# Patient Record
Sex: Female | Born: 1940
Health system: Southern US, Community
[De-identification: ages and names within clinical notes are randomized; demographics above are authoritative.]

## PROBLEM LIST (undated history)

## (undated) ENCOUNTER — Emergency Department (HOSPITAL_BASED_OUTPATIENT_CLINIC_OR_DEPARTMENT_OTHER): Discharge status: Absent without leave | Payer: Medicare Other

## (undated) DIAGNOSIS — K219 Gastro-esophageal reflux disease without esophagitis: Secondary | ICD-10-CM

## (undated) DIAGNOSIS — N809 Endometriosis, unspecified: Secondary | ICD-10-CM

## (undated) DIAGNOSIS — F32A Depression, unspecified: Secondary | ICD-10-CM

## (undated) DIAGNOSIS — G43909 Migraine, unspecified, not intractable, without status migrainosus: Secondary | ICD-10-CM

## (undated) DIAGNOSIS — I10 Essential (primary) hypertension: Secondary | ICD-10-CM

## (undated) DIAGNOSIS — F329 Major depressive disorder, single episode, unspecified: Secondary | ICD-10-CM

## (undated) DIAGNOSIS — F419 Anxiety disorder, unspecified: Secondary | ICD-10-CM

## (undated) DIAGNOSIS — C50919 Malignant neoplasm of unspecified site of unspecified female breast: Secondary | ICD-10-CM

## (undated) HISTORY — PX: PELVIC FRACTURE SURGERY: SHX119

## (undated) HISTORY — DX: Gastro-esophageal reflux disease without esophagitis: K21.9

## (undated) HISTORY — PX: OTHER SURGICAL HISTORY: SHX169

## (undated) HISTORY — DX: Migraine, unspecified, not intractable, without status migrainosus: G43.909

## (undated) HISTORY — DX: Endometriosis, unspecified: N80.9

## (undated) HISTORY — DX: Essential (primary) hypertension: I10

## (undated) HISTORY — DX: Malignant neoplasm of unspecified site of unspecified female breast: C50.919

## (undated) HISTORY — PX: BREAST LUMPECTOMY: SHX2

## (undated) HISTORY — DX: Major depressive disorder, single episode, unspecified: F32.9

## (undated) HISTORY — DX: Depression, unspecified: F32.A

## (undated) HISTORY — DX: Anxiety disorder, unspecified: F41.9

---

## 1995-01-22 DIAGNOSIS — C50919 Malignant neoplasm of unspecified site of unspecified female breast: Secondary | ICD-10-CM

## 1995-01-22 HISTORY — DX: Malignant neoplasm of unspecified site of unspecified female breast: C50.919

## 1997-03-24 ENCOUNTER — Ambulatory Visit (HOSPITAL_COMMUNITY): Admission: RE | Admit: 1997-03-24 | Discharge: 1997-03-24 | Payer: Self-pay | Admitting: General Surgery

## 1997-11-13 ENCOUNTER — Emergency Department (HOSPITAL_COMMUNITY): Admission: EM | Admit: 1997-11-13 | Discharge: 1997-11-13 | Payer: Self-pay

## 1997-12-01 ENCOUNTER — Other Ambulatory Visit: Admission: RE | Admit: 1997-12-01 | Discharge: 1997-12-01 | Payer: Self-pay | Admitting: Obstetrics and Gynecology

## 1998-05-02 ENCOUNTER — Ambulatory Visit (HOSPITAL_COMMUNITY): Admission: RE | Admit: 1998-05-02 | Discharge: 1998-05-02 | Payer: Self-pay | Admitting: Obstetrics and Gynecology

## 1998-05-02 ENCOUNTER — Encounter: Payer: Self-pay | Admitting: General Surgery

## 1999-02-12 ENCOUNTER — Other Ambulatory Visit: Admission: RE | Admit: 1999-02-12 | Discharge: 1999-02-12 | Payer: Self-pay | Admitting: Obstetrics and Gynecology

## 1999-05-07 ENCOUNTER — Ambulatory Visit (HOSPITAL_COMMUNITY): Admission: RE | Admit: 1999-05-07 | Discharge: 1999-05-07 | Payer: Self-pay | Admitting: General Surgery

## 1999-05-07 ENCOUNTER — Encounter: Payer: Self-pay | Admitting: General Surgery

## 2000-03-05 ENCOUNTER — Other Ambulatory Visit: Admission: RE | Admit: 2000-03-05 | Discharge: 2000-03-05 | Payer: Self-pay | Admitting: Obstetrics and Gynecology

## 2000-05-08 ENCOUNTER — Encounter: Payer: Self-pay | Admitting: General Surgery

## 2000-05-08 ENCOUNTER — Ambulatory Visit (HOSPITAL_COMMUNITY): Admission: RE | Admit: 2000-05-08 | Discharge: 2000-05-08 | Payer: Self-pay | Admitting: General Surgery

## 2001-05-12 ENCOUNTER — Encounter: Admission: RE | Admit: 2001-05-12 | Discharge: 2001-05-12 | Payer: Self-pay | Admitting: Obstetrics and Gynecology

## 2001-05-12 ENCOUNTER — Encounter: Payer: Self-pay | Admitting: Obstetrics and Gynecology

## 2002-05-14 ENCOUNTER — Encounter: Admission: RE | Admit: 2002-05-14 | Discharge: 2002-05-14 | Payer: Self-pay | Admitting: Obstetrics and Gynecology

## 2002-05-14 ENCOUNTER — Encounter: Payer: Self-pay | Admitting: Obstetrics and Gynecology

## 2002-06-03 ENCOUNTER — Other Ambulatory Visit: Admission: RE | Admit: 2002-06-03 | Discharge: 2002-06-03 | Payer: Self-pay | Admitting: Obstetrics and Gynecology

## 2002-11-22 ENCOUNTER — Ambulatory Visit (HOSPITAL_COMMUNITY): Admission: RE | Admit: 2002-11-22 | Discharge: 2002-11-22 | Payer: Self-pay | Admitting: Gastroenterology

## 2002-11-22 ENCOUNTER — Encounter: Payer: Self-pay | Admitting: Internal Medicine

## 2002-11-22 LAB — HM COLONOSCOPY

## 2003-05-16 ENCOUNTER — Encounter: Admission: RE | Admit: 2003-05-16 | Discharge: 2003-05-16 | Payer: Self-pay | Admitting: Obstetrics and Gynecology

## 2003-07-26 ENCOUNTER — Other Ambulatory Visit: Admission: RE | Admit: 2003-07-26 | Discharge: 2003-07-26 | Payer: Self-pay | Admitting: Obstetrics and Gynecology

## 2004-05-29 ENCOUNTER — Encounter: Admission: RE | Admit: 2004-05-29 | Discharge: 2004-05-29 | Payer: Self-pay | Admitting: Obstetrics and Gynecology

## 2004-09-18 ENCOUNTER — Other Ambulatory Visit: Admission: RE | Admit: 2004-09-18 | Discharge: 2004-09-18 | Payer: Self-pay | Admitting: Obstetrics and Gynecology

## 2005-07-12 ENCOUNTER — Encounter: Admission: RE | Admit: 2005-07-12 | Discharge: 2005-07-12 | Payer: Self-pay | Admitting: Obstetrics and Gynecology

## 2005-10-15 ENCOUNTER — Other Ambulatory Visit: Admission: RE | Admit: 2005-10-15 | Discharge: 2005-10-15 | Payer: Self-pay | Admitting: Obstetrics & Gynecology

## 2006-02-17 ENCOUNTER — Ambulatory Visit: Payer: Self-pay | Admitting: Family Medicine

## 2006-02-17 LAB — CONVERTED CEMR LAB
CO2: 30 meq/L (ref 19–32)
Calcium: 9.9 mg/dL (ref 8.4–10.5)
Chloride: 102 meq/L (ref 96–112)
Creatinine, Ser: 0.9 mg/dL (ref 0.4–1.2)
GFR calc Af Amer: 81 mL/min
GFR calc non Af Amer: 67 mL/min
Glucose, Bld: 93 mg/dL (ref 70–99)

## 2006-05-20 ENCOUNTER — Ambulatory Visit: Payer: Self-pay | Admitting: Family Medicine

## 2006-07-15 ENCOUNTER — Encounter: Admission: RE | Admit: 2006-07-15 | Discharge: 2006-07-15 | Payer: Self-pay | Admitting: Obstetrics & Gynecology

## 2006-08-28 ENCOUNTER — Encounter (INDEPENDENT_AMBULATORY_CARE_PROVIDER_SITE_OTHER): Payer: Self-pay | Admitting: Family Medicine

## 2006-10-06 ENCOUNTER — Ambulatory Visit: Payer: Self-pay | Admitting: Family Medicine

## 2006-10-06 DIAGNOSIS — Z853 Personal history of malignant neoplasm of breast: Secondary | ICD-10-CM | POA: Insufficient documentation

## 2006-10-06 DIAGNOSIS — F419 Anxiety disorder, unspecified: Secondary | ICD-10-CM

## 2006-10-06 DIAGNOSIS — I10 Essential (primary) hypertension: Secondary | ICD-10-CM | POA: Insufficient documentation

## 2006-10-06 DIAGNOSIS — F329 Major depressive disorder, single episode, unspecified: Secondary | ICD-10-CM

## 2006-10-07 ENCOUNTER — Telehealth (INDEPENDENT_AMBULATORY_CARE_PROVIDER_SITE_OTHER): Payer: Self-pay | Admitting: *Deleted

## 2006-10-07 LAB — CONVERTED CEMR LAB
GFR calc Af Amer: 93 mL/min
Potassium: 3.2 meq/L — ABNORMAL LOW (ref 3.5–5.1)
Sodium: 142 meq/L (ref 135–145)

## 2006-10-16 ENCOUNTER — Ambulatory Visit: Payer: Self-pay | Admitting: Family Medicine

## 2006-10-16 ENCOUNTER — Telehealth (INDEPENDENT_AMBULATORY_CARE_PROVIDER_SITE_OTHER): Payer: Self-pay | Admitting: *Deleted

## 2006-10-27 ENCOUNTER — Ambulatory Visit: Payer: Self-pay | Admitting: Family Medicine

## 2006-10-30 ENCOUNTER — Telehealth (INDEPENDENT_AMBULATORY_CARE_PROVIDER_SITE_OTHER): Payer: Self-pay | Admitting: *Deleted

## 2006-10-30 ENCOUNTER — Encounter (INDEPENDENT_AMBULATORY_CARE_PROVIDER_SITE_OTHER): Payer: Self-pay | Admitting: *Deleted

## 2006-10-30 LAB — CONVERTED CEMR LAB: Potassium: 3.5 meq/L (ref 3.5–5.1)

## 2006-11-07 ENCOUNTER — Ambulatory Visit: Payer: Self-pay | Admitting: Family Medicine

## 2006-11-25 ENCOUNTER — Other Ambulatory Visit: Admission: RE | Admit: 2006-11-25 | Discharge: 2006-11-25 | Payer: Self-pay | Admitting: Obstetrics and Gynecology

## 2006-12-17 ENCOUNTER — Ambulatory Visit: Payer: Self-pay | Admitting: Family Medicine

## 2007-01-29 ENCOUNTER — Encounter: Admission: RE | Admit: 2007-01-29 | Discharge: 2007-01-29 | Payer: Self-pay | Admitting: Obstetrics & Gynecology

## 2007-02-13 ENCOUNTER — Ambulatory Visit: Payer: Self-pay | Admitting: Family Medicine

## 2007-02-13 LAB — CONVERTED CEMR LAB
BUN: 9 mg/dL (ref 6–23)
CO2: 29 meq/L (ref 19–32)
Calcium: 9.4 mg/dL (ref 8.4–10.5)
Chloride: 102 meq/L (ref 96–112)
Creatinine, Ser: 0.8 mg/dL (ref 0.4–1.2)
GFR calc non Af Amer: 77 mL/min
Potassium: 3.5 meq/L (ref 3.5–5.1)
Sodium: 139 meq/L (ref 135–145)

## 2007-02-16 ENCOUNTER — Encounter (INDEPENDENT_AMBULATORY_CARE_PROVIDER_SITE_OTHER): Payer: Self-pay | Admitting: *Deleted

## 2007-04-07 ENCOUNTER — Ambulatory Visit: Payer: Self-pay | Admitting: Family Medicine

## 2007-04-08 ENCOUNTER — Encounter (INDEPENDENT_AMBULATORY_CARE_PROVIDER_SITE_OTHER): Payer: Self-pay | Admitting: *Deleted

## 2007-04-08 LAB — CONVERTED CEMR LAB
CO2: 32 meq/L (ref 19–32)
Creatinine, Ser: 0.8 mg/dL (ref 0.4–1.2)
GFR calc Af Amer: 93 mL/min
Glucose, Bld: 103 mg/dL — ABNORMAL HIGH (ref 70–99)
Sodium: 139 meq/L (ref 135–145)

## 2007-07-27 ENCOUNTER — Encounter: Admission: RE | Admit: 2007-07-27 | Discharge: 2007-07-27 | Payer: Self-pay | Admitting: Obstetrics and Gynecology

## 2007-08-18 ENCOUNTER — Ambulatory Visit: Payer: Self-pay | Admitting: Family Medicine

## 2007-08-26 ENCOUNTER — Encounter (INDEPENDENT_AMBULATORY_CARE_PROVIDER_SITE_OTHER): Payer: Self-pay | Admitting: *Deleted

## 2007-11-09 ENCOUNTER — Telehealth (INDEPENDENT_AMBULATORY_CARE_PROVIDER_SITE_OTHER): Payer: Self-pay | Admitting: *Deleted

## 2007-12-07 ENCOUNTER — Other Ambulatory Visit: Admission: RE | Admit: 2007-12-07 | Discharge: 2007-12-07 | Payer: Self-pay | Admitting: Obstetrics & Gynecology

## 2007-12-22 ENCOUNTER — Telehealth (INDEPENDENT_AMBULATORY_CARE_PROVIDER_SITE_OTHER): Payer: Self-pay | Admitting: *Deleted

## 2007-12-23 ENCOUNTER — Telehealth (INDEPENDENT_AMBULATORY_CARE_PROVIDER_SITE_OTHER): Payer: Self-pay | Admitting: *Deleted

## 2007-12-31 ENCOUNTER — Telehealth (INDEPENDENT_AMBULATORY_CARE_PROVIDER_SITE_OTHER): Payer: Self-pay | Admitting: *Deleted

## 2008-01-07 ENCOUNTER — Telehealth (INDEPENDENT_AMBULATORY_CARE_PROVIDER_SITE_OTHER): Payer: Self-pay | Admitting: *Deleted

## 2008-01-22 LAB — CONVERTED CEMR LAB: Pap Smear: NORMAL

## 2008-01-26 ENCOUNTER — Other Ambulatory Visit: Admission: RE | Admit: 2008-01-26 | Discharge: 2008-01-26 | Payer: Self-pay | Admitting: Obstetrics and Gynecology

## 2008-02-04 ENCOUNTER — Telehealth (INDEPENDENT_AMBULATORY_CARE_PROVIDER_SITE_OTHER): Payer: Self-pay | Admitting: *Deleted

## 2008-07-28 ENCOUNTER — Encounter: Admission: RE | Admit: 2008-07-28 | Discharge: 2008-07-28 | Payer: Self-pay | Admitting: Obstetrics and Gynecology

## 2008-08-24 ENCOUNTER — Ambulatory Visit: Payer: Self-pay | Admitting: Internal Medicine

## 2008-08-29 ENCOUNTER — Ambulatory Visit: Payer: Self-pay | Admitting: Internal Medicine

## 2008-08-30 ENCOUNTER — Encounter: Payer: Self-pay | Admitting: Internal Medicine

## 2008-09-02 LAB — CONVERTED CEMR LAB
CO2: 26 meq/L (ref 19–32)
Calcium: 9.4 mg/dL (ref 8.4–10.5)
Chloride: 109 meq/L (ref 96–112)
Cholesterol: 198 mg/dL (ref 0–200)
Eosinophils Absolute: 0.1 10*3/uL (ref 0.0–0.7)
Glucose, Bld: 93 mg/dL (ref 70–99)
Hemoglobin: 14.9 g/dL (ref 12.0–15.0)
LDL Cholesterol: 124 mg/dL — ABNORMAL HIGH (ref 0–99)
Lymphs Abs: 2 10*3/uL (ref 0.7–4.0)
MCV: 92.7 fL (ref 78.0–100.0)
Monocytes Absolute: 0.8 10*3/uL (ref 0.1–1.0)
Monocytes Relative: 12.6 % — ABNORMAL HIGH (ref 3.0–12.0)
Neutro Abs: 3.2 10*3/uL (ref 1.4–7.7)
Neutrophils Relative %: 52.9 % (ref 43.0–77.0)
RBC: 4.7 M/uL (ref 3.87–5.11)
RDW: 12.5 % (ref 11.5–14.6)
Sodium: 143 meq/L (ref 135–145)
Total CHOL/HDL Ratio: 4
VLDL: 20.2 mg/dL (ref 0.0–40.0)

## 2008-11-11 ENCOUNTER — Encounter: Payer: Self-pay | Admitting: Internal Medicine

## 2009-02-28 ENCOUNTER — Ambulatory Visit: Payer: Self-pay | Admitting: Internal Medicine

## 2009-08-22 ENCOUNTER — Encounter: Admission: RE | Admit: 2009-08-22 | Discharge: 2009-08-22 | Payer: Self-pay | Admitting: Obstetrics and Gynecology

## 2009-08-28 ENCOUNTER — Ambulatory Visit: Payer: Self-pay | Admitting: Internal Medicine

## 2009-08-28 DIAGNOSIS — K219 Gastro-esophageal reflux disease without esophagitis: Secondary | ICD-10-CM | POA: Insufficient documentation

## 2009-08-31 LAB — CONVERTED CEMR LAB
BUN: 16 mg/dL (ref 6–23)
Basophils Absolute: 0.1 10*3/uL (ref 0.0–0.1)
Basophils Relative: 1.3 % (ref 0.0–3.0)
Calcium: 9.4 mg/dL (ref 8.4–10.5)
Chloride: 103 meq/L (ref 96–112)
Cholesterol: 190 mg/dL (ref 0–200)
Eosinophils Absolute: 0.1 10*3/uL (ref 0.0–0.7)
GFR calc non Af Amer: 68.2 mL/min (ref >60)
Hemoglobin: 15 g/dL (ref 12.0–15.0)
Lymphocytes Relative: 38.7 % (ref 12.0–46.0)
Lymphs Abs: 2.4 10*3/uL (ref 0.7–4.0)
MCHC: 34.6 g/dL (ref 30.0–36.0)
Neutro Abs: 2.8 10*3/uL (ref 1.4–7.7)
Neutrophils Relative %: 45.9 % (ref 43.0–77.0)
Platelets: 316 10*3/uL (ref 150.0–400.0)
Potassium: 4 meq/L (ref 3.5–5.1)
RDW: 13.1 % (ref 11.5–14.6)
VLDL: 21 mg/dL (ref 0.0–40.0)
WBC: 6.2 10*3/uL (ref 4.5–10.5)

## 2009-09-11 ENCOUNTER — Telehealth (INDEPENDENT_AMBULATORY_CARE_PROVIDER_SITE_OTHER): Payer: Self-pay | Admitting: *Deleted

## 2009-09-19 ENCOUNTER — Telehealth (INDEPENDENT_AMBULATORY_CARE_PROVIDER_SITE_OTHER): Payer: Self-pay | Admitting: *Deleted

## 2009-11-23 ENCOUNTER — Encounter (INDEPENDENT_AMBULATORY_CARE_PROVIDER_SITE_OTHER): Payer: Self-pay | Admitting: *Deleted

## 2010-02-18 LAB — CONVERTED CEMR LAB
ALT: 18 U/L
AST: 21 U/L
Albumin: 4 g/dL
Alkaline Phosphatase: 68 U/L
BUN: 18 mg/dL
Basophils Absolute: 0.1 K/uL
Basophils Relative: 1.2 %
Bilirubin, Direct: 0.1 mg/dL
CO2: 31 meq/L
Calcium: 9.7 mg/dL
Chloride: 100 meq/L
Cholesterol: 189 mg/dL
Creatinine, Ser: 0.9 mg/dL
Eosinophils Absolute: 0.1 K/uL
Eosinophils Relative: 1 %
GFR calc Af Amer: 81 mL/min
GFR calc non Af Amer: 67 mL/min
Glucose, Bld: 100 mg/dL — ABNORMAL HIGH
HCT: 42.9 %
HDL: 58 mg/dL
Hemoglobin: 15.1 g/dL — ABNORMAL HIGH
LDL Cholesterol: 115 mg/dL — ABNORMAL HIGH
Lymphocytes Relative: 31.7 %
MCHC: 35.3 g/dL
MCV: 91.8 fL
Monocytes Absolute: 0.7 K/uL
Monocytes Relative: 10 %
Neutro Abs: 3.8 K/uL
Neutrophils Relative %: 56.1 %
Platelets: 320 K/uL
Potassium: 4.2 meq/L
RBC: 4.67 M/uL
RDW: 12.2 %
Sodium: 139 meq/L
TSH: 0.97 u[IU]/mL
Total Bilirubin: 0.7 mg/dL
Total CHOL/HDL Ratio: 3.3
Total Protein: 7.2 g/dL
Triglycerides: 78 mg/dL
VLDL: 16 mg/dL
WBC: 6.9 10*3/microliter

## 2010-02-20 NOTE — Miscellaneous (Signed)
Summary: got flu shot @ [facility]   Immunization History:  Influenza Immunization History:    Influenza:  got @ [facility] (11/16/2009)

## 2010-02-20 NOTE — Assessment & Plan Note (Signed)
Summary: 6 MTH FU/KDC   Vital Signs:  Patient profile:   70 year old female Weight:      149 pounds Pulse rate:   80 / minute BP sitting:   130 / 80  (left arm)  Vitals Entered By: Doristine Devoid (February 28, 2009 1:58 PM) CC: 6 month roa    History of Present Illness: follow-up from last office visit, doing very well  Allergies: 1)  Penicillin G Potassium (Penicillin G Potassium)  Past History:  Past Medical History: Reviewed history from 08/24/2008 and no changes required. Breast cancer, hx of, Dx 97, s/p lumpectomy and XRT (released from oncology) Hypertension Depression  Past Surgical History: Reviewed history from 08/24/2008 and no changes required. Lumpectomy--1997 pelvic surgery for endometriosis , remotely  Caesarean section x 2  Review of Systems       she had diarrhea, status post GI evaluation she was prescribed align, symptoms resolved reportedly she did some Hemoccults and send them back to GI and they were negative  denies nausea, vomiting, blood in  stools she has been on Wellbutrin for a while.  Symptoms are actually under excellent control, she wonders if she should d/c  Wellbutrin  Physical Exam  General:  alert, well-developed, and well-nourished.   Lungs:  normal respiratory effort, no intercostal retractions, no accessory muscle use, and normal breath sounds.   Heart:  normal rate, regular rhythm, no murmur, and no gallop.   Extremities:  no edema Psych:  Oriented X3, memory intact for recent and remote, normally interactive, good eye contact, not anxious appearing, and not depressed appearing.     Impression & Recommendations:  Problem # 1:  HYPERTENSION (ICD-401.9) well control, no change Her updated medication list for this problem includes:    Hydrochlorothiazide 25 Mg Tabs (Hydrochlorothiazide) .Marland Kitchen... 1 by mouth qd  BP today: 130/80 Prior BP: 110/70 (08/24/2008)  Labs Reviewed: K+: 4.6 (08/29/2008) Creat: : 0.8 (08/29/2008)    Chol: 198 (08/29/2008)   HDL: 53.40 (08/29/2008)   LDL: 124 (08/29/2008)   TG: 101.0 (08/29/2008)  Problem # 2:  DEPRESSION (ICD-311) on Wellbutrin for at least two years, symptoms well controlled,  patient would like to see if she can go without it recommend to decrease Wellbutrin from 150 to 75 mg and discontinue it in one month patient to let me know if there is problems Her updated medication list for this problem includes:    Wellbutrin 75 Mg Tabs (Bupropion hcl) .Marland Kitchen... 1 by mouth once daily x 1 month, then stop  Complete Medication List: 1)  Hydrochlorothiazide 25 Mg Tabs (Hydrochlorothiazide) .Marland Kitchen.. 1 by mouth qd 2)  Prilosec Otc 20 Mg Tbec (Omeprazole magnesium) 3)  Albertsons Fiber Laxative 625 Mg Tabs (Calcium polycarbophil) 4)  Oscal 500/200 D-3 500-200 Mg-unit Tabs (Calcium-vitamin d) 5)  Centrum Silver Chew (Multiple vitamins-minerals) 6)  Glucosamine-chondroitin 1500-1200 Mg/24ml Liqd (Glucosamine-chondroitin) 7)  Co Q-10 Vitamin E Fish Oil 60-90-25-200 Caps (Dha-epa-coenzyme q10-vitamin e) 8)  Wellbutrin 75 Mg Tabs (Bupropion hcl) .Marland Kitchen.. 1 by mouth once daily x 1 month, then stop 9)  Klor-con M20 20 Meq Cr-tabs (Potassium chloride crys cr) .... Take 1 tab daily 10)  Flonase 50 Mcg/act Susp (Fluticasone propionate) .... 2 squirts each nostril 11)  Fiber Cap  .Marland Kitchen.. 1 by mouth qid  Patient Instructions: 1)  Please schedule a follow-up appointment in 6 months (fasting, physical exam) Prescriptions: WELLBUTRIN 75 MG TABS (BUPROPION HCL) 1 by mouth once daily x 1 month, then stop  #30 x  0   Entered and Authorized by:   Nolon Rod. Tammy Wickliffe MD   Signed by:   Nolon Rod. Kassi Esteve MD on 02/28/2009   Method used:   Electronically to        [id number]* (retail)       [address]       [place], [place]  [address]       Ph: [phone]       Fax: [phone]   RxID:   [id number]

## 2010-02-20 NOTE — Progress Notes (Signed)
Summary: REFILL  Phone Note Refill Request Message from:  Fax from Pharmacy on September 11, 2009 3:06 PM  Refills Requested: Medication #1:  HYDROCHLOROTHIAZIDE 25 MG  TABS 1 by mouth qd Lisabeth Devoid [id number] - TEL [phone]  Initial call taken by: Okey Regal Spring,  September 11, 2009 3:09 PM    Prescriptions: HYDROCHLOROTHIAZIDE 25 MG  TABS (HYDROCHLOROTHIAZIDE) 1 by mouth qd  #90 x 3   Entered by:   Army Fossa CMA   Authorized by:   Nolon Rod. Paz MD   Signed by:   Army Fossa CMA on 09/11/2009   Method used:   Electronically to        [facility]* (retail)       [address] [place], [place]  [address]       Ph: [phone]       Fax: [phone]   RxID:   [id number]

## 2010-02-20 NOTE — Progress Notes (Signed)
Summary: refill  Phone Note Refill Request Message from:  Fax from Pharmacy on September 19, 2009 9:11 AM  Refills Requested: Medication #1:  KLOR-CON M20 20 MEQ CR-TABS take 1 tab daily sams - fax [phone]  Initial call taken by: Okey Regal Spring,  September 19, 2009 9:12 AM    Prescriptions: KLOR-CON M20 20 MEQ CR-TABS (POTASSIUM CHLORIDE CRYS CR) take 1 tab daily  #90 x 3   Entered by:   Army Fossa CMA   Authorized by:   Nolon Rod. Paz MD   Signed by:   Army Fossa CMA on 09/19/2009   Method used:   Electronically to        [facility]* (retail)       [address] [place], [place]  [address]       Ph: [phone]       Fax: [phone]   RxID:   [id number]

## 2010-02-20 NOTE — Assessment & Plan Note (Signed)
Summary: CPX/FASTING/KDC   Vital Signs:  Patient profile:   70 year old female Height:      63 inches Weight:      144.38 pounds BMI:     25.67 Pulse rate:   88 / minute Pulse rhythm:   regular BP sitting:   132 / 80  (left arm) Cuff size:   regular  Vitals Entered By: Army Fossa CMA (August 28, 2009 8:36 AM) CC: CPX: Fasting Comments had mammo and dexa last week- not gotten results pap due next month discuss pneumovax. will get zostavax today.   History of Present Illness: Here for Medicare AWV:  1.   Risk factors based on Past M, S, F history: yes  2.   Physical Activities: very active  3.   Depression/mood: denies , not problems noted today 4.   Hearing: normal per patient , no issues notes  5.   ADL's: totally independent  6.   Fall Risk: low risk, no recent falls  7.   Home Safety: feels safe at home  8.   Height, weight, &visual acuity: see Vs, vision wnl  9.   Counseling: yes , see below  10.   Labs ordered based on risk factors: yes  11.           Referral Coordination: if needed  12.           Care Plan:  see A/P 13.            Cognitive Assessment : memory, alertness and motor skills seem to be appropriate  in addition to above, we  took care of the following issues...  Breast cancer-- released from oncology, gyn doing her breast exam  Hypertension--no ambulatory BPs  Depression-- still on Welbutrin 150, symptoms well controlled, feels like she can tapper dose down GERD-- symptoms well controlled x years    Current Medications (verified): 1)  Hydrochlorothiazide 25 Mg  Tabs (Hydrochlorothiazide) .Marland Kitchen.. 1 By Mouth Qd 2)  Prilosec Otc 20 Mg  Tbec (Omeprazole Magnesium) 3)  Alber[employer]ons Fiber Laxative 625 Mg  Tabs (Calcium Polycarbophil) 4)  Oscal 500/200 D-3 500-200 Mg-Unit  Tabs (Calcium-Vitamin D) 5)  Centrum Silver   Chew (Multiple Vitamins-Minerals) 6)  Glucosamine-Chondroitin 1500-1200 Mg/80ml  Liqd (Glucosamine-Chondroitin) 7)  Co Q-10 Vitamin E  Fish Oil 60-90-25-200  Caps (Dha-Epa-Coenzyme Q10-Vitamin E) 8)  Wellbutrin 75 Mg Tabs (Bupropion Hcl) .Marland Kitchen.. 1 By Mouth Once Daily X 1 Month, Then Stop 9)  Klor-Con M20 20 Meq Cr-Tabs (Potassium Chloride Crys Cr) .... Take 1 Tab Daily 10)  Flonase 50 Mcg/act  Susp (Fluticasone Propionate) .... 2 Squirts Each Nostril 11)  Fiber Cap .Marland Kitchen.. 1 By Mouth Qid 12)  Vitamin B Complex  Caps (B Complex Vitamins) .... Daily 13)  Vitamin D3 1000 Unit Tabs (Cholecalciferol) .... Daily 14)  Vita-Plus E 400 Unit Caps (Vitamin E) .... Daily  Allergies: 1)  Penicillin G Potassium (Penicillin G Potassium)  Past History:  Past Medical History: Breast cancer, hx of, Dx 97, s/p lumpectomy and XRT (released from oncology) Hypertension Depression GERD w/ "stretching" remotely   Past Surgical History: Reviewed history from 08/24/2008 and no changes required. 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 CV:  Denies chest pain or discomfort and swelling of feet. Resp:  Denies cough, shortness of breath, and wheezing. GI:  Denies bloody stools, diarrhea, nausea, and vomiting. Neuro:  Denies headaches and memory loss. Psych:  Denies anxiety; sleeps well .  Physical Exam  General:  alert, well-developed, and well-nourished.   Neck:  no masses, no thyromegaly, and normal carotid upstroke.   Lungs:  normal respiratory effort, no  intercostal retractions, no accessory muscle use, and normal breath sounds.   Heart:  normal rate, regular rhythm, no murmur, and no gallop.   Abdomen:  soft, non-tender, no distention, no masses, no guarding, and no rigidity.   Extremities:  no lower extremity edema Psych:  Cognition and judgment appear intact. Alert and cooperative with normal attention span and concentration. not anxious appearing and not depressed appearing.     Impression & Recommendations:  Problem # 1:  PREVENTIVE HEALTH CARE (ICD-V70.0) immunizations correction TD 2001 and 8- 2010 pneumonia shot---was Rx  8 - 2010  but apparently she actually got a Td (see OV note from 8-10).  We'll give a pneumonia shot today shingles shot --today  Colonoscopy:   Date:  11/22/2002   was seen last year by GI for diarrhea and a positive Hemoccult, they were considering a colonoscopy but next routine Cscope was decided to be in 2014  female cared by gynecology Mammogram:   Date:  01/22/2008  and had another mammogram last week Pap Smear: per gynecology DEXA had one last week per patient  continue with her healthy diet and exercise   Orders: Venipuncture ([address]) TLB-BMP (Basic Metabolic Panel-BMET) (80048-METABOL) TLB-CBC Platelet - w/Differential (85025-CBCD) TLB-ALT (SGPT) (84460-ALT) TLB-AST (SGOT) (84450-SGOT) TLB-Lipid Panel (80061-LIPID) TLB-TSH (Thyroid Stimulating Hormone) (84443-TSH) Medicare -1st Annual Wellness Visit [phone])  Problem # 2:  GERD (ICD-530.81) asymptomatic for years Change Prilosec to p.r.n. Her updated medication list for this problem includes:    Prilosec Otc 20 Mg Tbec (Omeprazole magnesium) ..... One by mouth once daily as needed  Problem # 3:  DEPRESSION (ICD-311) in the past, we recommended  to decrease Wellbutrin from 150 to 75 mg and discontinue it in one month she is still on Wellbutrin 150 symptoms are very well controlled for a while Plan:decrease to 75 mg for a month, then stop       Problem # 4:  HYPERTENSION (ICD-401.9) well-controlled, no change Her updated medication list for this problem includes:    Hydrochlorothiazide 25 Mg Tabs (Hydrochlorothiazide) .Marland Kitchen... 1 by mouth qd  BP today: 132/80 Prior BP: 130/80 (02/28/2009)  Labs Reviewed: K+: 4.6 (08/29/2008) Creat: : 0.8 (08/29/2008)   Chol: 198 (08/29/2008)   HDL: 53.40 (08/29/2008)   LDL: 124 (08/29/2008)   TG: 101.0 (08/29/2008)  Complete Medication List: 1)  Wellbutrin 75 Mg Tabs (Bupropion hcl) .Marland Kitchen.. 1 by mouth once daily x 1 month, then stop 2)  Hydrochlorothiazide 25 Mg Tabs (Hydrochlorothiazide) .Marland Kitchen.. 1 by mouth qd 3)  Prilosec Otc 20 Mg Tbec (Omeprazole magnesium) .... One by mouth once daily as needed 4)  Alber[employer]ons Fiber Laxative 625 Mg Tabs (Calcium polycarbophil) 5)  Oscal 500/200 D-3 500-200 Mg-unit Tabs (Calcium-vitamin d) 6)  Flonase 50 Mcg/act Susp (Fluticasone propionate) .... 2  squirts each nostril 7)  Centrum Silver Chew (Multiple vitamins-minerals) 8)  Glucosamine-chondroitin 1500-1200 Mg/35ml Liqd (Glucosamine-chondroitin) 9)  Co Q-10 Vitamin E Fish Oil 60-90-25-200 Caps (Dha-epa-coenzyme q10-vitamin e) 10)  Klor-con M20 20 Meq Cr-tabs (Potassium chloride crys cr) .... Take 1 tab daily 11)  Fiber Cap  .Marland Kitchen.. 1 by mouth qid 12)  Vitamin B Complex Caps (B complex vitamins) .... Daily 13)  Vitamin D3 1000 Unit Tabs (Cholecalciferol) .... Daily 14)  Vita-plus E 400 Unit Caps (Vitamin e) .... Daily  Other Orders: Pneumococcal Vaccine ([address]) Admin 1st Vaccine ([address]) Admin 1st Vaccine [facility]) [phone]) Zoster (Shingles) Vaccine Live [phone]) Admin of Any Addtl Vaccine ([address]21) Admin of Any Addtl Vaccine (State) [phone])  Patient Instructions: 1)  Please schedule a follow-up appointment in 1 year.  Prescriptions: WELLBUTRIN 75 MG TABS (BUPROPION HCL) 1 by mouth once daily x 1 month, then stop  #30 x 0   Entered and Authorized by:   Karla Vines E. Marycarmen Hagey MD   Signed by:   Nolon Rod. Said Rueb MD on  08/28/2009   Method used:   Print then Give to Patient   RxID:   [id number]      Pneumovax Vaccine    Vaccine Type: Pneumovax    Site: right deltoid    Mfr: Merck    Dose: 0.5 ml    Route: IM    Given by: Army Fossa CMA    Exp. Date: 02/07/2011    Lot #: [id number]  Zostavax # 1    Vaccine Type: Zostavax    Site: left deltoid    Mfr: Merck    Dose: 0.5 ml    Route: [place]    Given by: Army Fossa CMA    Exp. Date: 09/07/2010    Lot #: [id number]

## 2010-02-20 NOTE — Consult Note (Signed)
Summary: GI visit for diarrhea: Rx observation for now  [facility]   Imported By: Lanelle Bal 09/06/2008 10:03:47  _____________________________________________________________________  External Attachment:    Type:   Image     Comment:   External Document

## 2010-03-05 ENCOUNTER — Encounter: Payer: Self-pay | Admitting: Internal Medicine

## 2010-03-05 ENCOUNTER — Ambulatory Visit (INDEPENDENT_AMBULATORY_CARE_PROVIDER_SITE_OTHER): Payer: Medicare Other | Admitting: Internal Medicine

## 2010-03-05 DIAGNOSIS — M25569 Pain in unspecified knee: Secondary | ICD-10-CM

## 2010-03-14 NOTE — Assessment & Plan Note (Signed)
Summary: Left knee swelling and pain due to previous injury/discuss or...   Vital Signs:  Patient profile:   70 year old female Weight:      156.13 pounds Pulse rate:   76 / minute Pulse rhythm:   regular BP sitting:   140 / 80  (left arm) Cuff size:   regular  Vitals Entered By: Army Fossa CMA (March 05, 2010 10:55 AM) CC: Pt here (L) knee injury x 2 weeks Comments swelling  [facility]    History of Present Illness: last week was kneeling and trying to reach at the same time, felt a "pop" and the L knee cap "went out of place", came back to position spontaneusly soon after since then , has mild swelling, feels a "catch "  w/  walking and has  pain sx decrease w/ rest  today is feeling slightly  better than yesterday   ROS no previous surgery no fever no calf pain or swelling    Current Medications (verified): 1)  Wellbutrin 75 Mg Tabs (Bupropion Hcl) .Marland Kitchen.. 1 By Mouth Once Daily X 1 Month, Then Stop 2)  Hydrochlorothiazide 25 Mg  Tabs (Hydrochlorothiazide) .Marland Kitchen.. 1 By Mouth Qd 3)  Prilosec Otc 20 Mg  Tbec (Omeprazole Magnesium) .... One By Mouth Once Daily As Needed 4)  [employer]ons Fiber Laxative 625 Mg  Tabs (Calcium Polycarbophil) 5)  Oscal 500/200 D-3 500-200 Mg-Unit  Tabs (Calcium-Vitamin D) 6)  Centrum Silver   Chew (Multiple Vitamins-Minerals) 7)  Co Q-10 Vitamin E Fish Oil 60-90-25-200  Caps (Dha-Epa-Coenzyme Q10-Vitamin E) 8)  Klor-Con M20 20 Meq Cr-Tabs (Potassium Chloride Crys Cr) .... Take 1 Tab Daily 9)  Vita-Plus E 400 Unit Caps (Vitamin E) .... Daily  Allergies (verified): 1)  Penicillin G Potassium (Penicillin G Potassium)  Past History:  Past Medical History: Reviewed history from 08/28/2009 and no changes required. Breast cancer, hx of, Dx 97, s/p lumpectomy and XRT (released from oncology) Hypertension Depression GERD w/ "stretching" remotely   Past Surgical History: Reviewed history from 08/24/2008 and no changes  required. Lumpectomy--1997 pelvic surgery for endometriosis , remotely  Caesarean section x 2  Physical Exam  General:  alert and well-developed.   Extremities:  R knee normal L knee: slightly  swelling at the distal area from the patella , medial>external, no red or warm  Unble to extend the knee 100% no deformities  patella is midline , slightly  less mobile than R     Impression & Recommendations:  Problem # 1:  KNEE PAIN (ICD-719.46)  she likely had a patellar dislocation that self resolved, still unable to extend the knee all the way see instructions refer to ortho  Orders: Orthopedic Referral (Ortho)  Complete Medication List: 1)  Wellbutrin 75 Mg Tabs (Bupropion hcl) .Marland Kitchen.. 1 by mouth once daily x 1 month, then stop 2)  Hydrochlorothiazide 25 Mg Tabs (Hydrochlorothiazide) .Marland Kitchen.. 1 by mouth qd 3)  Prilosec Otc 20 Mg Tbec (Omeprazole magnesium) .... One by mouth once daily as needed 4)  [employer]ons Fiber Laxative 625 Mg Tabs (Calcium polycarbophil) 5)  Oscal 500/200 D-3 500-200 Mg-unit Tabs (Calcium-vitamin d) 6)  Centrum Silver Chew (Multiple vitamins-minerals) 7)  Co Q-10 Vitamin E Fish Oil 60-90-25-200 Caps (Dha-epa-coenzyme q10-vitamin e) 8)  Klor-con M20 20 Meq Cr-tabs (Potassium chloride crys cr) .... Take 1 tab daily 9)  Vita-plus E 400 Unit Caps (Vitamin e) .... Daily  Patient Instructions: 1)  rest 2)  ice 3)  motrin 200mg  OTC 2  tabs every 8 hours as needed for pain, watch for stomach irritation   Orders Added: 1)  Orthopedic Referral [Ortho] 2)  Est. Patient Level III [[address]]

## 2010-06-08 NOTE — Assessment & Plan Note (Signed)
[facility]                        [place] OFFICE NOTE   SKII, CLELAND                    MRN:          [id number]  DATE:02/17/2006                            DOB:          [birth year]    REASON FOR VISIT:  Establish care. Miss Flaim is a 70 year old female  here to establish care. She reports that she was diagnosed with  hypertension by her gynecologist in September 2007. She is otherwise  pretty healthy with no significant issues at this point.   PAST MEDICAL HISTORY:  1. Hypertension, diagnosed September 2007.  2. Endometriosis.  3. Left breast cancer, status post radiation plus tamoxifen followed      by raloxifene.   MEDICATIONS:  1. Hydrochlorothiazide 12.5.  2. Fiber capsule 2 daily.  3. Multivitamin daily.  4. Prilosec 1 daily.  5. Calcium with vitamin D 2 daily.  6. Vagifem twice weekly.  7. Glucosamine/chondroitin 2 daily.   ALLERGIES:  PENICILLIN, POSITIVE FOR RASH.   SURGERIES:  1. Endometriosis, 1975.  2. C-section, x2.  3. Lumpectomy, 1997.   REVIEW OF SYSTEMS:  As per HPI, otherwise unremarkable.   OBJECTIVE:  Weight 143.6, temperature 98.5, pulse 82, blood pressure  134/86.  GENERAL: We have a pleasant female in no acute distress, has questioned  appropriately, alert and oriented x3.  NECK: Supple, no lymphadenopathy, carotid bruits, or JVD.  LUNGS: Clear.  HEART: Regular rate and rhythm, normal S1, S2, no murmurs, gallops, or  rubs.  EXTREMITIES: No cyanosis clubbing or edema.   IMPRESSION:  Hypertension, currently on hydrochlorothiazide 12.5 mg.   PLAN:  1. We will check a fasting BMET, as well as a lipid profile. Of note      she did have a lipid panel done by her gynecologist on October 15, 2005 which showed a slight elevation of her      triglycerides at 218, her LDL was 100 and her HDL was 59.  2. We will obtain a baseline EKG.  3. The patient to follow up in 3 months or sooner if  need be.     Leanne Chang, M.D.  Electronically Signed    LA/MedQ  DD: 02/17/2006  DT: 02/17/2006  Job #: [id number]

## 2010-06-08 NOTE — Op Note (Signed)
   NAME:  Monique Ballard, Monique Ballard                       ACCOUNT NO.:  [id number]   MEDICAL RECORD NO.:  [id number]                   PATIENT TYPE:  AMB   LOCATION:  ENDO                                 FACILITY:  [facility]   PHYSICIAN:  Bernette Redbird, M.D.                DATE OF BIRTH:  [birth year]   DATE OF PROCEDURE:  11/22/2002  DATE OF DISCHARGE:                                 OPERATIVE REPORT   PROCEDURE:  Colonoscopy.   INDICATIONS:  Screening for colon cancer in a 70 year old female.   FINDINGS:  Normal exam to the terminal ileum except minimal sigmoid  diverticulosis.   CONSENT:  The nature, purpose and risks of this procedure had been discussed  with the patient who provided written consent.   SEDATION:  Sedation for this procedure and the upper endoscopy which  proceeded it totaled fentanyl 100 mcg and  Versed 10 mg IV without  arrhythmias or desaturation.  There were some partially prolapsed internal  hemorrhoids on external exam.   DESCRIPTION OF PROCEDURE:  The Olympus adjustable tension pediatric video  coloscope was advanced to the terminal ileum without significant difficulty,  using some external abdominal compression and turning the patient into the  supine position.   The terminal ileum had a normal appearance.  Pull-back was performed.  The  quality of the prep was excellent and it was felt that all areas were well  seen.   There was some mild sigmoid diverticulosis with some associated muscular  thickening but this was otherwise normal exam without evidence of polyps,  cancer, colitis, or vascular malformations.  Retroflexion of the rectum was  unremarkable.  No biopsies were obtained.  The patient tolerated the  procedure well and there were no apparent complications.    IMPRESSION:  Sigmoid diverticulosis, otherwise normal screening exam of a  patient without risk factors or symptoms.   RECOMMENDATIONS:  Consider followup sigmoidoscopy or repeat colonoscopy  in  about five years.                                               Bernette Redbird, M.D.    RB/MEDQ  D:  11/22/2002  T:  11/22/2002  Job:  [id number]   cc:   Laqueta Linden, M.D.  [address]  [place]  [place] [address]  Fax: [phone]

## 2010-06-08 NOTE — Op Note (Signed)
NAME:  Monique Ballard, Monique Ballard                       ACCOUNT NO.:  [id number]   MEDICAL RECORD NO.:  [id number]                   PATIENT TYPE:  AMB   LOCATION:  ENDO                                 FACILITY:  [facility]   PHYSICIAN:  Bernette Redbird, M.D.                DATE OF BIRTH:  Feb 03, 1940   DATE OF PROCEDURE:  11/22/2002  DATE OF DISCHARGE:                                 OPERATIVE REPORT   PROCEDURE:  Upper endoscopy with Savary dilatation of the esophagus.   INDICATIONS:  A 70 year old female with a several month history of dysphagia  symptoms, improved substantially since being started on Prilosec about six  weeks ago.   FINDINGS:  Small hiatal hernia.  Dilatation performed to 18 mm.   DESCRIPTION OF PROCEDURE:  The nature, purpose and risks of the procedure  had been discussed with the patient who provided written consent.  Sedation  was fentanyl 50 mcg and Versed 6 mg IV without arrhythmias or desaturation.   The patient had been taken from the endoscopy unit to fluoroscopy for this  procedure and the Olympus video endoscope was passed under direct vision.  The vocal cords looked normal except for a little thickening of the  posterior commissure at the larynx.  The esophagus was readily entered.   There was a patch of erythematous mucosa in the distal esophagus but no  diffuse esophagitis and no well-defined ring or stricture.  No free reflux,  Barrett's mucosa, varices, infection, or neoplasia were observed.   The stomach contained no significant residual, just a small amount of bile.  No gastritis, erosions, ulcer, polyps, or masses.  A retroflex view showed a  normal cardia.  The pylorus and proximal duodenal bulb were slightly  deformed.  The second duodenum looked normal.   Savary dilatation was then performed in the standard fashion.  The spring  tipped guidewire was passed through the scope into the antrum of the  stomach, and the scope was removed.  Fluoroscopic  confirmation of  appropriate positioning of the wire was achieved and then a single passage  was made with an 18-mm Savary dilator, encountering smooth resistance.   The dilator and guidewire were removed together and the patient was  reendoscoped under direct vision which showed a linear slit in the mucosa  with some fresh hemorrhage, a short distance above the squamocolumnar  junction, with a very widely patent minimal esophageal ring present.   The scope was removed from the patient.  Note that there was no evidence or  undue trauma to the stomach or hypopharynx.   The patient tolerated the procedure well and there were no apparent  complications.    IMPRESSION:  Esophageal dilatation performed as described above.   PLAN:  Continue Prilosec for now, then may try going off it and she how she  does.  Bernette Redbird, M.D.    RB/MEDQ  D:  11/22/2002  T:  11/22/2002  Job:  [id number]   cc:   Laqueta Linden, M.D.  [address], [address]  [place]  [place] [address]  Fax: [phone]

## 2010-08-07 ENCOUNTER — Other Ambulatory Visit: Payer: Self-pay | Admitting: Obstetrics and Gynecology

## 2010-08-07 DIAGNOSIS — Z1231 Encounter for screening mammogram for malignant neoplasm of breast: Secondary | ICD-10-CM

## 2010-08-27 ENCOUNTER — Other Ambulatory Visit: Payer: Self-pay | Admitting: Internal Medicine

## 2010-08-29 ENCOUNTER — Ambulatory Visit
Admission: RE | Admit: 2010-08-29 | Discharge: 2010-08-29 | Disposition: A | Discharge status: Home / Self Care | Payer: Medicare Other | Source: Ambulatory Visit | Attending: Obstetrics and Gynecology | Admitting: Obstetrics and Gynecology

## 2010-08-29 DIAGNOSIS — Z1231 Encounter for screening mammogram for malignant neoplasm of breast: Secondary | ICD-10-CM

## 2010-08-29 LAB — HM MAMMOGRAPHY

## 2010-09-07 ENCOUNTER — Ambulatory Visit (INDEPENDENT_AMBULATORY_CARE_PROVIDER_SITE_OTHER): Payer: Medicare Other | Admitting: Internal Medicine

## 2010-09-07 ENCOUNTER — Encounter: Payer: Self-pay | Admitting: Internal Medicine

## 2010-09-07 DIAGNOSIS — I10 Essential (primary) hypertension: Secondary | ICD-10-CM

## 2010-09-07 DIAGNOSIS — F329 Major depressive disorder, single episode, unspecified: Secondary | ICD-10-CM

## 2010-09-07 DIAGNOSIS — M25569 Pain in unspecified knee: Secondary | ICD-10-CM

## 2010-09-07 DIAGNOSIS — Z0000 Encounter for general adult medical examination without abnormal findings: Secondary | ICD-10-CM

## 2010-09-07 DIAGNOSIS — K219 Gastro-esophageal reflux disease without esophagitis: Secondary | ICD-10-CM

## 2010-09-07 LAB — CBC WITH DIFFERENTIAL/PLATELET
Basophils Relative: 0.8 % (ref 0.0–3.0)
Eosinophils Absolute: 0.1 10*3/uL (ref 0.0–0.7)
MCHC: 34 g/dL (ref 30.0–36.0)
MCV: 92.9 fl (ref 78.0–100.0)
Monocytes Absolute: 0.7 10*3/uL (ref 0.1–1.0)
Neutrophils Relative %: 53.7 % (ref 43.0–77.0)
Platelets: 327 10*3/uL (ref 150.0–400.0)

## 2010-09-07 LAB — BASIC METABOLIC PANEL
BUN: 14 mg/dL (ref 6–23)
CO2: 27 mEq/L (ref 19–32)
Chloride: 105 mEq/L (ref 96–112)
Creatinine, Ser: 0.9 mg/dL (ref 0.4–1.2)
Glucose, Bld: 94 mg/dL (ref 70–99)

## 2010-09-07 LAB — LIPID PANEL
Cholesterol: 184 mg/dL (ref 0–200)
Triglycerides: 129 mg/dL (ref 0.0–149.0)

## 2010-09-07 MED ORDER — BUPROPION HCL ER (XL) 150 MG PO TB24: 300.0000 mg | ORAL_TABLET | ORAL | Status: DC

## 2010-09-07 NOTE — Assessment & Plan Note (Addendum)
Was on Wellbutrin for years, last year we decided to gradually discontinue it as she was feeling very well.  She is not feel as well as she did before, mild depression, no suicidal ideas. Plan: Wellbutrin 150 a day for one month then 300 daily. (300  Mg was her usual dose, likes to go back to that )

## 2010-09-07 NOTE — Progress Notes (Signed)
  Subjective:    Patient ID: Monique Ballard, female    DOB: Jun 17, 1940, 70 y.o.   MRN: [id number]  HPI Here for Medicare AWV:  1. Risk factors based on Past M, S, F history: yes  2. Physical Activities: very active , treadmill, exercise routines 3. Depression/mood: stopped Wellbutrin ~ 1 year, not feeling as well, lethargic, interest lost; would like to go back to meds  4. Hearing: normal per patient , no issues notes  5. ADL's: totally independent  6. Fall Risk: low risk, no recent falls  7. Home Safety: feels safe at home  8. Height, weight, &visual acuity: see Vs, vision wnl  9. Counseling: yes , see below  10. Labs ordered based on risk factors: yes  11.           Referral Coordination: if needed  12.           Care Plan:  see A/P 13.            Cognitive Assessment : memory, alertness and motor skills seem to be appropriate  In addition, we discussed the following Had knee pain-- better, issue resolved  HTN-- no recent amb BPs, good medication compliance  GERD-- well controlled on PPIs daily  Breast ca-- current strategy is yearly MMG   Past Medical History  Diagnosis Date  . Breast cancer     Dx 1997, lumpectomy, XRT  . HTN (hypertension)   . Depression   . GERD (gastroesophageal reflux disease)     w/"stretching" remotely   Past Surgical History  Procedure Date  . Breast lumpectomy 1997  . Pelvic fracture surgery     endometriosis, remotely  . 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   Cardiovascular: Negative for chest pain and leg swelling.  Gastrointestinal: Negative for abdominal pain and blood in stool.  Genitourinary: Negative for dysuria, hematuria, vaginal bleeding, vaginal discharge and difficulty urinating.  Psychiatric/Behavioral:       No suicidal       Objective:   Physical Exam  Constitutional: She is oriented to person, place, and time. She appears well-developed and well-nourished.  HENT:  Head: Normocephalic and atraumatic.  Neck: No thyromegaly present.       Normal carotid pulses  Cardiovascular: Normal rate, regular rhythm and normal heart sounds.   No murmur heard. Pulmonary/Chest: Effort normal and breath sounds normal. No respiratory distress. She has no wheezes. She has no rales.  Abdominal: Soft. Bowel sounds are normal. She exhibits no distension. There is no tenderness. There is no rebound and no guarding.  Musculoskeletal: She exhibits no edema.  Neurological: She is alert and oriented to person, place, and time.  Psychiatric: She has a normal mood and affect. Her behavior is normal. Judgment and thought content normal.          Assessment & Plan:

## 2010-09-07 NOTE — Assessment & Plan Note (Signed)
Well controlled on daily PPIs, we agreed that she could decrease PPIs to 3- 4 times a week and see how she does .

## 2010-09-07 NOTE — Patient Instructions (Addendum)
wellbutrin 1 tablet a day x 1 month, then 2 tablets a day Check the  blood pressure 2 or 3 times a week, be sure it is less than 140/85. If it is consistently higher, let me know Followup in 6 months to see about Wellbutrin

## 2010-09-07 NOTE — Assessment & Plan Note (Addendum)
TD 2001 and 8- 2010 pneumonia shot 2011 shingles shot 2011 Colonoscopy:   Date:  11/22/2002   was seen last year by GI for diarrhea and a positive Hemoccult, they were considering a colonoscopy but next routine Cscope was decided to be in 2014  female cared by gynecology-- PAP, breast exam, DEXA Mammogram: had one recently  continue with her healthy diet and exercise

## 2010-09-07 NOTE — Assessment & Plan Note (Signed)
Diastolic blood pressure today 88, seen instructions.   labs

## 2010-09-07 NOTE — Assessment & Plan Note (Signed)
Resolved

## 2010-10-08 ENCOUNTER — Other Ambulatory Visit: Payer: Self-pay | Admitting: Internal Medicine

## 2010-10-08 MED ORDER — HYDROCHLOROTHIAZIDE 25 MG PO TABS: 25.0000 mg | ORAL_TABLET | Freq: Every day | ORAL | Status: DC

## 2010-10-08 NOTE — Telephone Encounter (Signed)
rx sent into pharmacy

## 2010-10-30 ENCOUNTER — Other Ambulatory Visit: Payer: Self-pay | Admitting: *Deleted

## 2010-10-30 DIAGNOSIS — F329 Major depressive disorder, single episode, unspecified: Secondary | ICD-10-CM

## 2010-10-30 MED ORDER — HYDROCHLOROTHIAZIDE 25 MG PO TABS: 25.0000 mg | ORAL_TABLET | Freq: Every day | ORAL | Status: DC

## 2010-10-30 MED ORDER — BUPROPION HCL ER (XL) 150 MG PO TB24: 300.0000 mg | ORAL_TABLET | ORAL | Status: DC

## 2011-01-31 DIAGNOSIS — L259 Unspecified contact dermatitis, unspecified cause: Secondary | ICD-10-CM | POA: Diagnosis not present

## 2011-01-31 DIAGNOSIS — L570 Actinic keratosis: Secondary | ICD-10-CM | POA: Diagnosis not present

## 2011-02-14 ENCOUNTER — Other Ambulatory Visit: Payer: Self-pay | Admitting: *Deleted

## 2011-02-14 MED ORDER — POTASSIUM CHLORIDE CRYS ER 20 MEQ PO TBCR: 20.0000 meq | EXTENDED_RELEASE_TABLET | Freq: Every day | ORAL | Status: DC

## 2011-03-11 ENCOUNTER — Ambulatory Visit (INDEPENDENT_AMBULATORY_CARE_PROVIDER_SITE_OTHER): Payer: Medicare Other | Admitting: Internal Medicine

## 2011-03-11 VITALS — BP 138/84 | HR 98 | Temp 98.3°F | Wt 149.0 lb

## 2011-03-11 DIAGNOSIS — F329 Major depressive disorder, single episode, unspecified: Secondary | ICD-10-CM | POA: Diagnosis not present

## 2011-03-11 MED ORDER — CITALOPRAM HYDROBROMIDE 20 MG PO TABS: 20.0000 mg | ORAL_TABLET | Freq: Every day | ORAL | Status: DC

## 2011-03-11 MED ORDER — BUPROPION HCL ER (XL) 150 MG PO TB24: 300.0000 mg | ORAL_TABLET | ORAL | Status: DC

## 2011-03-11 NOTE — Assessment & Plan Note (Signed)
See previous entry, still with some depression. Different modalities of treatment were discussed including counseling, medication. She states that she has several people she could get counseling from. Will add citalopram, side effects discussed. Reassess in 2 months. See instructions.

## 2011-03-11 NOTE — Progress Notes (Signed)
  Subjective:    Patient ID: Monique Ballard, female    DOB: 1940/09/04, 71 y.o.   MRN: [id number]  HPI Routine visit This visit is to follow up on depression, she restarted Wellbutrin few months ago, she still has some depression, thinks related to family issues. Described her mood as "no enthusiasm, blah"  Past Medical History  Diagnosis Date  . Breast cancer     Dx 1997, lumpectomy, XRT  . HTN (hypertension)   . Depression   . GERD (gastroesophageal reflux disease)     w/"stretching" remotely    Review of Systems Denies suicidal ideas. Not sleeping well, having to take antihistaminics at night to help sleep. Mild anxiety as well.     Objective:   Physical Exam Alert oriented in no apparent distress. Cooperative, coherent. Not depressed or anxious appearing.     Assessment & Plan:

## 2011-03-11 NOTE — Patient Instructions (Signed)
Citalopram 20 mg: take 1/2 tablet at night the first week, then take a whole tablet every night Call if side effects

## 2011-03-12 ENCOUNTER — Encounter: Payer: Self-pay | Admitting: Internal Medicine

## 2011-03-14 ENCOUNTER — Other Ambulatory Visit: Payer: Self-pay | Admitting: Internal Medicine

## 2011-03-14 NOTE — Telephone Encounter (Signed)
Refill done.  

## 2011-05-09 ENCOUNTER — Ambulatory Visit: Payer: Medicare Other | Admitting: Internal Medicine

## 2011-05-10 ENCOUNTER — Ambulatory Visit (INDEPENDENT_AMBULATORY_CARE_PROVIDER_SITE_OTHER): Payer: Medicare Other | Admitting: Internal Medicine

## 2011-05-10 VITALS — BP 139/82 | HR 77 | Temp 98.2°F | Wt 148.0 lb

## 2011-05-10 DIAGNOSIS — F329 Major depressive disorder, single episode, unspecified: Secondary | ICD-10-CM | POA: Diagnosis not present

## 2011-05-10 DIAGNOSIS — F3289 Other specified depressive episodes: Secondary | ICD-10-CM

## 2011-05-10 MED ORDER — CITALOPRAM HYDROBROMIDE 20 MG PO TABS: 20.0000 mg | ORAL_TABLET | Freq: Every day | ORAL | Status: DC

## 2011-05-10 NOTE — Progress Notes (Signed)
  Subjective:    Patient ID: Monique Ballard, female    DOB: 03-15-1940, 71 y.o.   MRN: [id number]  HPI Followup from previous visit. She was depressed, citalopram was added to Wellbutrin. Reports a good response, feels better.  Past Medical History  Diagnosis Date  . Breast cancer     Dx 1997, lumpectomy, XRT  . HTN (hypertension)   . Depression   . GERD (gastroesophageal reflux disease)     w/"stretching" remotely   Past Surgical History  Procedure Date  . Breast lumpectomy 1997  . Pelvic fracture surgery     endometriosis, remotely  . Cesarean section     x2     Review of Systems No nausea, vomiting, diarrhea. No anxiety or headaches. There is still some issues at home.     Objective:   Physical Exam  Alert oriented x3, no apparent distress. Cooperative, coherent. Although she was not depressed or anxious appearing at the last visit, today she seems more enthusiastic and better.      Assessment & Plan:

## 2011-05-10 NOTE — Assessment & Plan Note (Signed)
Improving with the addition of citalopram to wellbutrin, no apparent side effects. There is still some issues at home , she is counseled. We discussed the possibility increase her medication but at this point she feels that she is doing better and doesn't like to change anything. reassess on RTC

## 2011-05-13 ENCOUNTER — Encounter: Payer: Self-pay | Admitting: Internal Medicine

## 2011-07-30 ENCOUNTER — Other Ambulatory Visit: Payer: Self-pay | Admitting: Internal Medicine

## 2011-07-30 NOTE — Telephone Encounter (Signed)
Refill done.  

## 2011-09-10 ENCOUNTER — Encounter: Payer: Medicare Other | Admitting: Internal Medicine

## 2011-09-27 ENCOUNTER — Other Ambulatory Visit: Payer: Self-pay | Admitting: Obstetrics and Gynecology

## 2011-09-27 DIAGNOSIS — Z1231 Encounter for screening mammogram for malignant neoplasm of breast: Secondary | ICD-10-CM

## 2011-10-04 ENCOUNTER — Ambulatory Visit (INDEPENDENT_AMBULATORY_CARE_PROVIDER_SITE_OTHER): Payer: Medicare Other | Admitting: Internal Medicine

## 2011-10-04 ENCOUNTER — Encounter: Payer: Self-pay | Admitting: Internal Medicine

## 2011-10-04 VITALS — BP 116/78 | HR 109 | Temp 98.3°F | Ht 62.25 in | Wt 147.0 lb

## 2011-10-04 DIAGNOSIS — Z0000 Encounter for general adult medical examination without abnormal findings: Secondary | ICD-10-CM

## 2011-10-04 DIAGNOSIS — M810 Age-related osteoporosis without current pathological fracture: Secondary | ICD-10-CM | POA: Insufficient documentation

## 2011-10-04 DIAGNOSIS — Z853 Personal history of malignant neoplasm of breast: Secondary | ICD-10-CM

## 2011-10-04 DIAGNOSIS — I10 Essential (primary) hypertension: Secondary | ICD-10-CM

## 2011-10-04 DIAGNOSIS — M8580 Other specified disorders of bone density and structure, unspecified site: Secondary | ICD-10-CM

## 2011-10-04 DIAGNOSIS — F329 Major depressive disorder, single episode, unspecified: Secondary | ICD-10-CM

## 2011-10-04 NOTE — Assessment & Plan Note (Addendum)
TD 2001 and 8- 2010, pneumonia shot 2011, shingles shot 2011 Colonoscopy:   Date:  11/22/2002  , next 2014   female cared by gynecology-- PAP, breast exam  Mammogram UTD  continue with her healthy diet and exercise

## 2011-10-04 NOTE — Assessment & Plan Note (Addendum)
Released from oncology, breast exam @ gyn

## 2011-10-04 NOTE — Assessment & Plan Note (Signed)
Doing great with current medications

## 2011-10-04 NOTE — Assessment & Plan Note (Signed)
Last DEXA 08-2009, osteopenia On ca and vit d , needs referral-->  will arrange to be done at the same time of MMG

## 2011-10-04 NOTE — Progress Notes (Signed)
Subjective:    Patient ID: Blondell Reveal, female    DOB: 1940/11/06, 71 y.o.   MRN: [id number]  HPI  HPI Here for Medicare AWV: 1.         Risk factors based on Past M, S, F history: yes   2.         Physical Activities: very active , TV program exercise  3.         Depression/mood: on Wellbutrin, feeling well 4.         Hearing: normal per patient , no issues notes   5.         ADL's: totally independent   6.         Fall Risk: prevention discussed  7.         Home Safety: feels safe at home   8.         Height, weight, &visual acuity: see Vs, vision wnl , sees eye doctor  9.         Counseling: yes , see below   10.       Labs ordered based on risk factors: yes   11.       Referral Coordination: if needed   12.       Care Plan:  see A/P 13.       Cognitive Assessment : memory, alertness and motor skills seem to be appropriate  We also discussed the following High blood pressure, good medication compliance, no apparent side effects. Osteopenia, needs a referral for another bone density test History of breast cancer, has been released by oncology, breast exam at gynecology, to have a mammogram soon.   Past Medical History  Diagnosis Date  . Breast cancer     Dx 1997, lumpectomy, XRT  . HTN (hypertension)   . Depression   . GERD (gastroesophageal reflux disease)     w/"stretching" remotely   Past Surgical History  Procedure Date  . Breast lumpectomy 1997  . Pelvic fracture surgery     endometriosis, remotely  . 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   Cardiovascular: Negative for chest pain and leg swelling.  Gastrointestinal: Negative for abdominal pain and blood in stool.  Genitourinary: Negative for dysuria and hematuria.   Current Outpatient Rx  Name Route Sig Dispense Refill  . BUPROPION HCL ER (XL) 150 MG PO TB24 Oral Take 2 tablets (300 mg total) by mouth every morning. 60 tablet 6  . CALCIUM CARBONATE-VITAMIN D 500-200 MG-UNIT PO TABS Oral Take 1 tablet by mouth daily.      Marland Kitchen VITAMIN D3 3000 UNITS PO TABS Oral Take by mouth.      Marland Kitchen CITALOPRAM HYDROBROMIDE 20 MG PO TABS Oral Take 1 tablet (20 mg total) by mouth daily. 30 tablet 6  . CO Q-10 VITAMIN E FISH OIL 60-90-25-200 PO CAPS Oral Take by mouth daily.      Marland Kitchen HYDROCHLOROTHIAZIDE 25 MG PO TABS  TAKE 1 TABLET BY MOUTH  ONCE DAILY 30 tablet 3  . CENTRUM SILVER PO Oral Take 1 each by mouth daily.      Marland Kitchen OMEPRAZOLE MAGNESIUM 20 MG PO TBEC Oral Take 20 mg by mouth daily.      Marland Kitchen CALCIUM POLYCARBOPHIL 625 MG PO TABS Oral Take 625 mg by mouth daily.      Marland Kitchen POTASSIUM CHLORIDE CRYS ER 20 MEQ PO TBCR Oral Take 1 tablet (20 mEq total) by mouth daily. 90 tablet 3  . AMBULATORY NON FORMULARY MEDICATION Oral Take 2,000 mcg by mouth daily. Medication Name: vit D3          Objective:   Physical Exam  General -- alert, well-developed, and well-nourished.   Neck --no thyromegaly , normal carotid pulse Lungs -- normal respiratory effort, no intercostal retractions, no accessory muscle use, and normal breath sounds.   Heart-- normal rate, regular rhythm, no murmur, and no gallop.   Abdomen--soft, non-tender, no distention, no masses, no HSM, no guarding, and no rigidity.   Extremities-- no pretibial edema bilaterally Neurologic-- alert & oriented X3 and strength normal in all extremities. Psych-- Cognition and judgment appear intact. Alert and cooperative with normal attention span and concentration.  not anxious appearing and  not depressed appearing.      Assessment & Plan:

## 2011-10-04 NOTE — Assessment & Plan Note (Signed)
Well-controlled, no change, labs 

## 2011-10-04 NOTE — Patient Instructions (Addendum)
Please come back fasting: CMP, FLP --- dx hypertension ----- Next visit in one year for a physical, call sooner for an appointment if you need [place].

## 2011-10-08 ENCOUNTER — Other Ambulatory Visit: Payer: Medicare Other

## 2011-10-16 ENCOUNTER — Ambulatory Visit: Payer: Medicare Other

## 2011-10-16 ENCOUNTER — Other Ambulatory Visit: Payer: Medicare Other

## 2011-10-23 ENCOUNTER — Ambulatory Visit
Admission: RE | Admit: 2011-10-23 | Discharge: 2011-10-23 | Disposition: A | Discharge status: Home / Self Care | Payer: Medicare Other | Source: Ambulatory Visit | Attending: Obstetrics and Gynecology | Admitting: Obstetrics and Gynecology

## 2011-10-23 ENCOUNTER — Ambulatory Visit
Admission: RE | Admit: 2011-10-23 | Discharge: 2011-10-23 | Disposition: A | Discharge status: Home / Self Care | Payer: Medicare Other | Source: Ambulatory Visit | Attending: Internal Medicine | Admitting: Internal Medicine

## 2011-10-23 DIAGNOSIS — M949 Disorder of cartilage, unspecified: Secondary | ICD-10-CM | POA: Diagnosis not present

## 2011-10-23 DIAGNOSIS — Z1231 Encounter for screening mammogram for malignant neoplasm of breast: Secondary | ICD-10-CM

## 2011-10-23 DIAGNOSIS — M899 Disorder of bone, unspecified: Secondary | ICD-10-CM | POA: Diagnosis not present

## 2011-10-23 DIAGNOSIS — M8580 Other specified disorders of bone density and structure, unspecified site: Secondary | ICD-10-CM

## 2011-11-06 ENCOUNTER — Encounter: Payer: Self-pay | Admitting: Internal Medicine

## 2011-11-09 ENCOUNTER — Telehealth: Payer: Self-pay | Admitting: Internal Medicine

## 2011-11-09 NOTE — Telephone Encounter (Signed)
Labs were Rx last month, I don't see any results. Please schedule CMP, FLP --- dx hypertension

## 2011-11-11 NOTE — Telephone Encounter (Signed)
thx

## 2011-11-11 NOTE — Telephone Encounter (Signed)
Pt scheduled  

## 2011-11-20 ENCOUNTER — Other Ambulatory Visit (INDEPENDENT_AMBULATORY_CARE_PROVIDER_SITE_OTHER): Payer: Medicare Other

## 2011-11-20 DIAGNOSIS — I10 Essential (primary) hypertension: Secondary | ICD-10-CM | POA: Diagnosis not present

## 2011-11-20 LAB — LIPID PANEL
LDL Cholesterol: 87 mg/dL (ref 0–99)
VLDL: 31 mg/dL (ref 0.0–40.0)

## 2011-11-20 LAB — COMPREHENSIVE METABOLIC PANEL
AST: 25 U/L (ref 0–37)
Albumin: 3.8 g/dL (ref 3.5–5.2)
Alkaline Phosphatase: 64 U/L (ref 39–117)
Potassium: 3.6 mEq/L (ref 3.5–5.1)
Sodium: 138 mEq/L (ref 135–145)
Total Protein: 7.4 g/dL (ref 6.0–8.3)

## 2011-11-25 ENCOUNTER — Encounter: Payer: Self-pay | Admitting: *Deleted

## 2011-11-26 ENCOUNTER — Other Ambulatory Visit: Payer: Self-pay | Admitting: Internal Medicine

## 2011-11-26 NOTE — Telephone Encounter (Signed)
Refill done.  

## 2012-01-07 ENCOUNTER — Other Ambulatory Visit: Payer: Self-pay | Admitting: Internal Medicine

## 2012-01-07 NOTE — Telephone Encounter (Signed)
Refill done.  

## 2012-01-31 ENCOUNTER — Other Ambulatory Visit: Payer: Self-pay | Admitting: Internal Medicine

## 2012-01-31 ENCOUNTER — Encounter: Payer: Self-pay | Admitting: Internal Medicine

## 2012-01-31 ENCOUNTER — Ambulatory Visit (INDEPENDENT_AMBULATORY_CARE_PROVIDER_SITE_OTHER): Payer: Medicare Other | Admitting: Internal Medicine

## 2012-01-31 ENCOUNTER — Encounter: Payer: Self-pay | Admitting: *Deleted

## 2012-01-31 VITALS — BP 124/76 | HR 94 | Temp 98.1°F | Wt 149.0 lb

## 2012-01-31 DIAGNOSIS — R05 Cough: Secondary | ICD-10-CM | POA: Diagnosis not present

## 2012-01-31 MED ORDER — OMEPRAZOLE 40 MG PO CPDR: 40.0000 mg | DELAYED_RELEASE_CAPSULE | Freq: Every day | ORAL | Status: DC

## 2012-01-31 MED ORDER — FLUTICASONE PROPIONATE 50 MCG/ACT NA SUSP: 2.0000 | Freq: Every day | NASAL | Status: DC

## 2012-01-31 NOTE — Assessment & Plan Note (Addendum)
Several months history of cough, symptoms not consistent with sinusitis. She's not on ACE inhibitors. DDX: GERD, postnasal dripping. Plan: Increase PPI dose, Flonase, if not better let me know

## 2012-01-31 NOTE — Progress Notes (Signed)
  Subjective:    Patient ID: Monique Ballard, female    DOB: 01-22-1940, 72 y.o.   MRN: [id number]  HPI Acute visit  Several months history of cough described as "tickle in the throat" which caused her to start coughing. Symptoms are not worse at night. No sputum production.  Past Medical History  Diagnosis Date  . Breast cancer     Dx 1997, lumpectomy, XRT  . HTN (hypertension)   . Depression   . GERD (gastroesophageal reflux disease)     w/"stretching" remotely   Past Surgical History  Procedure Date  . Breast lumpectomy 1997  . Pelvic fracture surgery     endometriosis, remotely  . Cesarean section     x2   History   Social History  . Marital Status: Married    Spouse Name: N/A    Number of Children: N/A  . Years of Education: N/A   Occupational History  . Not on file.   Social History Main Topics  . Smoking status: Former Games developer  . Smokeless tobacco: Never Used     Comment: Quit >30 yrs ago  . Alcohol Use: Yes     Comment: 1 glass wine dialy  . Drug Use: No  . Sexually Active: Not on file   Other Topics Concern  . Not on file   Social History Narrative   1 Older BrotherOccupation: retired 2011 from elementary school office2 children, 2 GKRegular Exercise-yes (walk 30 minutes dialy)Diet--healthy, most of time    Review of Systems Denies any wheezing. No classic heartburn with Prilosec 20 mg daily. Denies sneezing, fever, chills, nasal congestion.    Objective:   Physical Exam General -- alert, well-developed    Neck -- no LADs HEENT -- TMs normal, throat w/o redness, face symmetric and not tender to palpation, nose not congested Lungs -- normal respiratory effort, no intercostal retractions, no accessory muscle use, and normal breath sounds.   Heart-- normal rate, regular rhythm, no murmur, and no gallop.   Psych-- Cognition and judgment appear intact. Alert and cooperative with normal attention span and concentration.  not anxious appearing and not  depressed appearing.      Assessment & Plan:

## 2012-01-31 NOTE — Telephone Encounter (Signed)
Refill done.  

## 2012-01-31 NOTE — Patient Instructions (Addendum)
Let me know how this works, call if side effects

## 2012-02-01 ENCOUNTER — Encounter: Payer: Self-pay | Admitting: Internal Medicine

## 2012-02-26 ENCOUNTER — Ambulatory Visit (INDEPENDENT_AMBULATORY_CARE_PROVIDER_SITE_OTHER): Payer: Medicare Other | Admitting: Internal Medicine

## 2012-02-26 VITALS — BP 120/84 | HR 74 | Temp 98.7°F | Wt 147.0 lb

## 2012-02-26 DIAGNOSIS — R05 Cough: Secondary | ICD-10-CM | POA: Diagnosis not present

## 2012-02-26 DIAGNOSIS — R059 Cough, unspecified: Secondary | ICD-10-CM

## 2012-02-26 MED ORDER — AZELASTINE HCL 0.1 % NA SOLN: 2.0000 | Freq: Every day | NASAL | Status: DC

## 2012-02-26 NOTE — Progress Notes (Signed)
  Subjective:    Patient ID: Monique Ballard, female    DOB: 1940-09-30, 72 y.o.   MRN: [id number]  HPI Followup from previous visit. She was seen with chronic cough, PPIs dose increased, started nasal steroids. Cough is about the same, at times persistent, usually start as a "tickle in the throat". She does recognize that there is some postnasal dripping.  Past Medical History  Diagnosis Date  . Breast cancer     Dx 1997, lumpectomy, XRT  . HTN (hypertension)   . Depression   . GERD (gastroesophageal reflux disease)     w/"stretching" remotely   Past Surgical History  Procedure Date  . Breast lumpectomy 1997  . Pelvic fracture surgery     endometriosis, remotely  . Cesarean section     x2   History   Social History  . Marital Status: Married    Spouse Name: N/A    Number of Children: N/A  . Years of Education: N/A   Occupational History  . Not on file.   Social History Main Topics  . Smoking status: Former Games developer  . Smokeless tobacco: Never Used     Comment: Quit >30 yrs ago  . Alcohol Use: Yes     Comment: 1 glass wine dialy  . Drug Use: No  . Sexually Active: Not on file   Other Topics Concern  . Not on file   Social History Narrative   1 Older BrotherOccupation: retired 2011 from elementary school office2 children, 2 GKRegular Exercise-yes (walk 30 minutes dialy)Diet--healthy, most of time    Review of Systems No sputum production, no wheezing. Occasional watery eyes with cough.     Objective:   Physical Exam General -- alert, well-developed, and well-nourished.   Neck -- no LADs, no supraclavicular mass Lungs -- normal respiratory effort, no intercostal retractions, no accessory muscle use, and normal breath sounds.   Heart-- normal rate, regular rhythm, no murmur, and no gallop.   Extremities-- no pretibial edema bilaterally  Neurologic-- alert & oriented X3 and strength normal in all extremities. Psych-- Cognition and judgment appear intact. Alert  and cooperative with normal attention span and concentration.  not anxious appearing and not depressed appearing.       Assessment & Plan:

## 2012-02-26 NOTE — Patient Instructions (Addendum)
Please get your x-ray at the other [place]  office located at: [address] [place], across from [facility].  Please go to the basement, this is a walk-in facility, they are open from 8:30 to 5:30 PM. Phone number [phone]. --- Add astelin at bed time

## 2012-02-26 NOTE — Assessment & Plan Note (Signed)
Chronic cough, not better with increased dose of PPIs or nasal steroids. Plan:  Chest x-ray for completeness Add Astelin as she has some remanent postnasal dripping. Pulmonary referral

## 2012-02-27 ENCOUNTER — Encounter: Payer: Self-pay | Admitting: Internal Medicine

## 2012-02-27 ENCOUNTER — Ambulatory Visit (INDEPENDENT_AMBULATORY_CARE_PROVIDER_SITE_OTHER)
Admission: RE | Admit: 2012-02-27 | Discharge: 2012-02-27 | Disposition: A | Discharge status: Home / Self Care | Payer: Medicare Other | Source: Ambulatory Visit | Attending: Internal Medicine | Admitting: Internal Medicine

## 2012-02-27 DIAGNOSIS — R05 Cough: Secondary | ICD-10-CM | POA: Diagnosis not present

## 2012-02-27 DIAGNOSIS — R059 Cough, unspecified: Secondary | ICD-10-CM | POA: Diagnosis not present

## 2012-02-27 IMAGING — CR DG CHEST 2V
2 series · 2 of 2 positions shown · non-contrast
Comparison: None.

CLINICAL DATA: Persistent cough for several months.

CHEST - 2 VIEW

[view not recorded (1 of 2)]
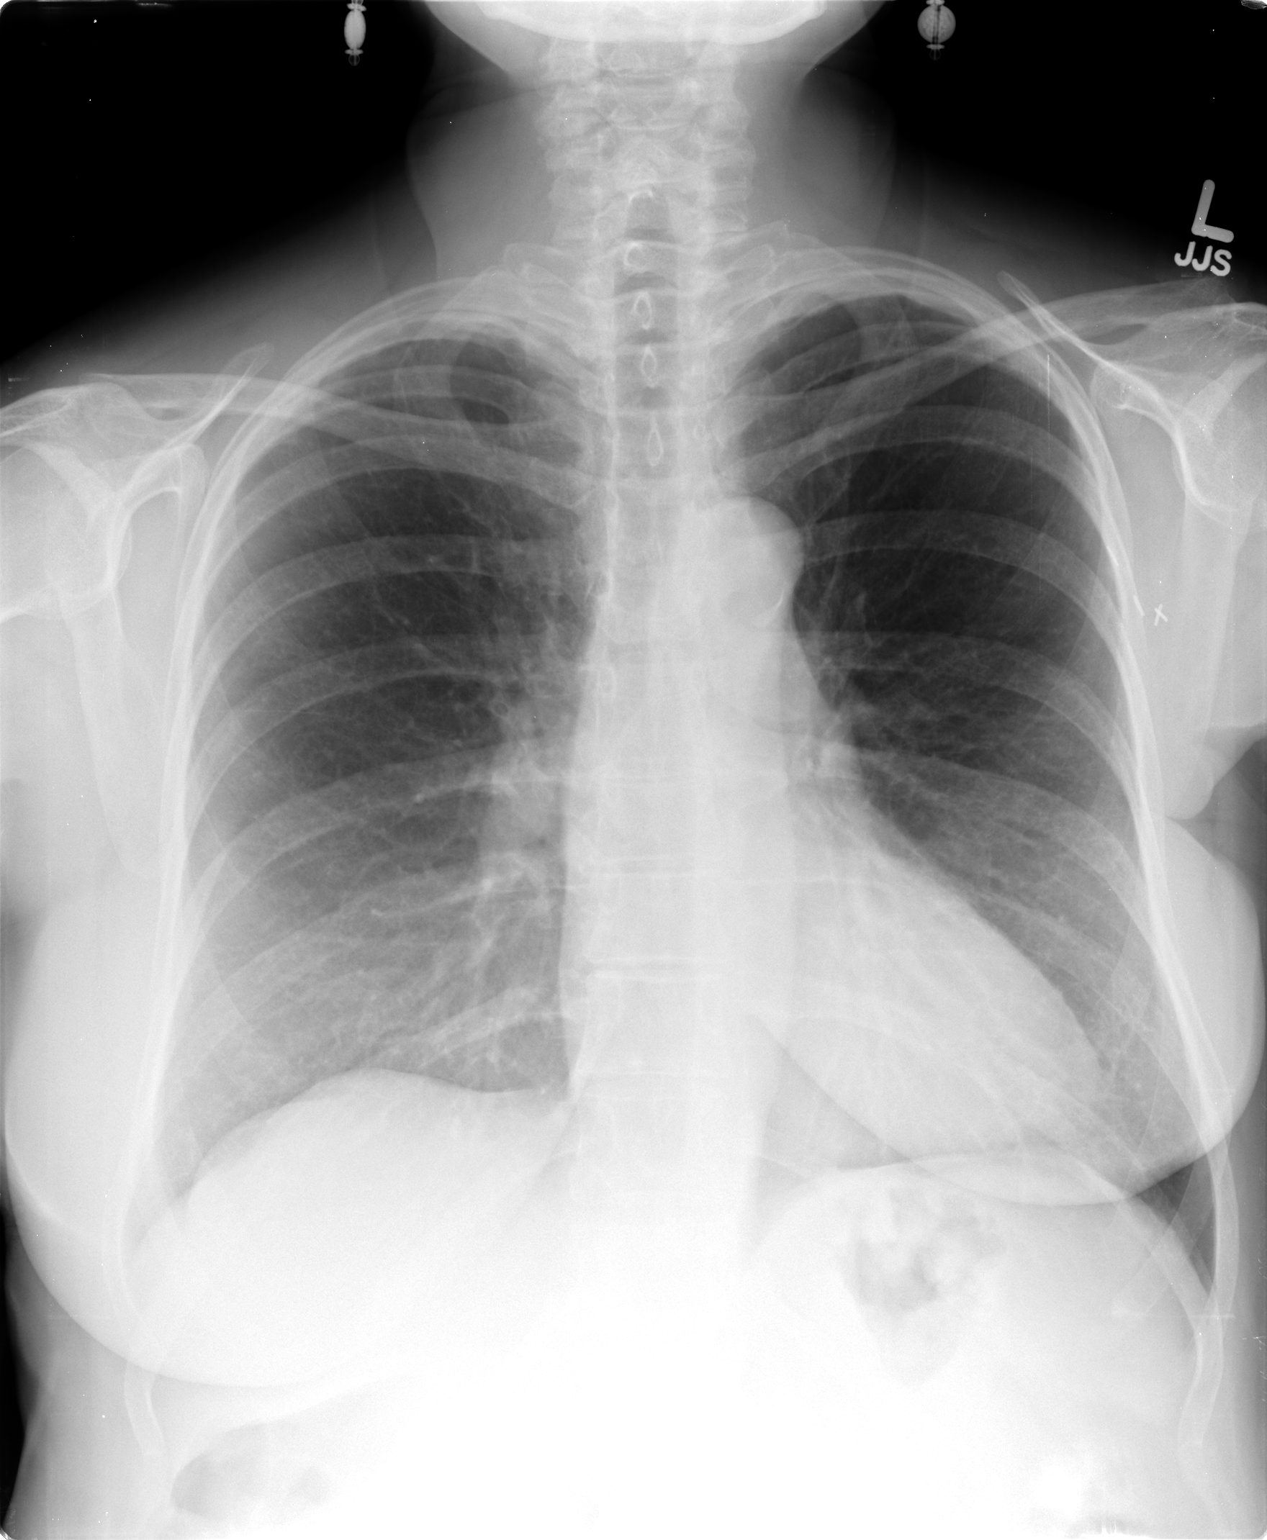

[view not recorded (2 of 2)]
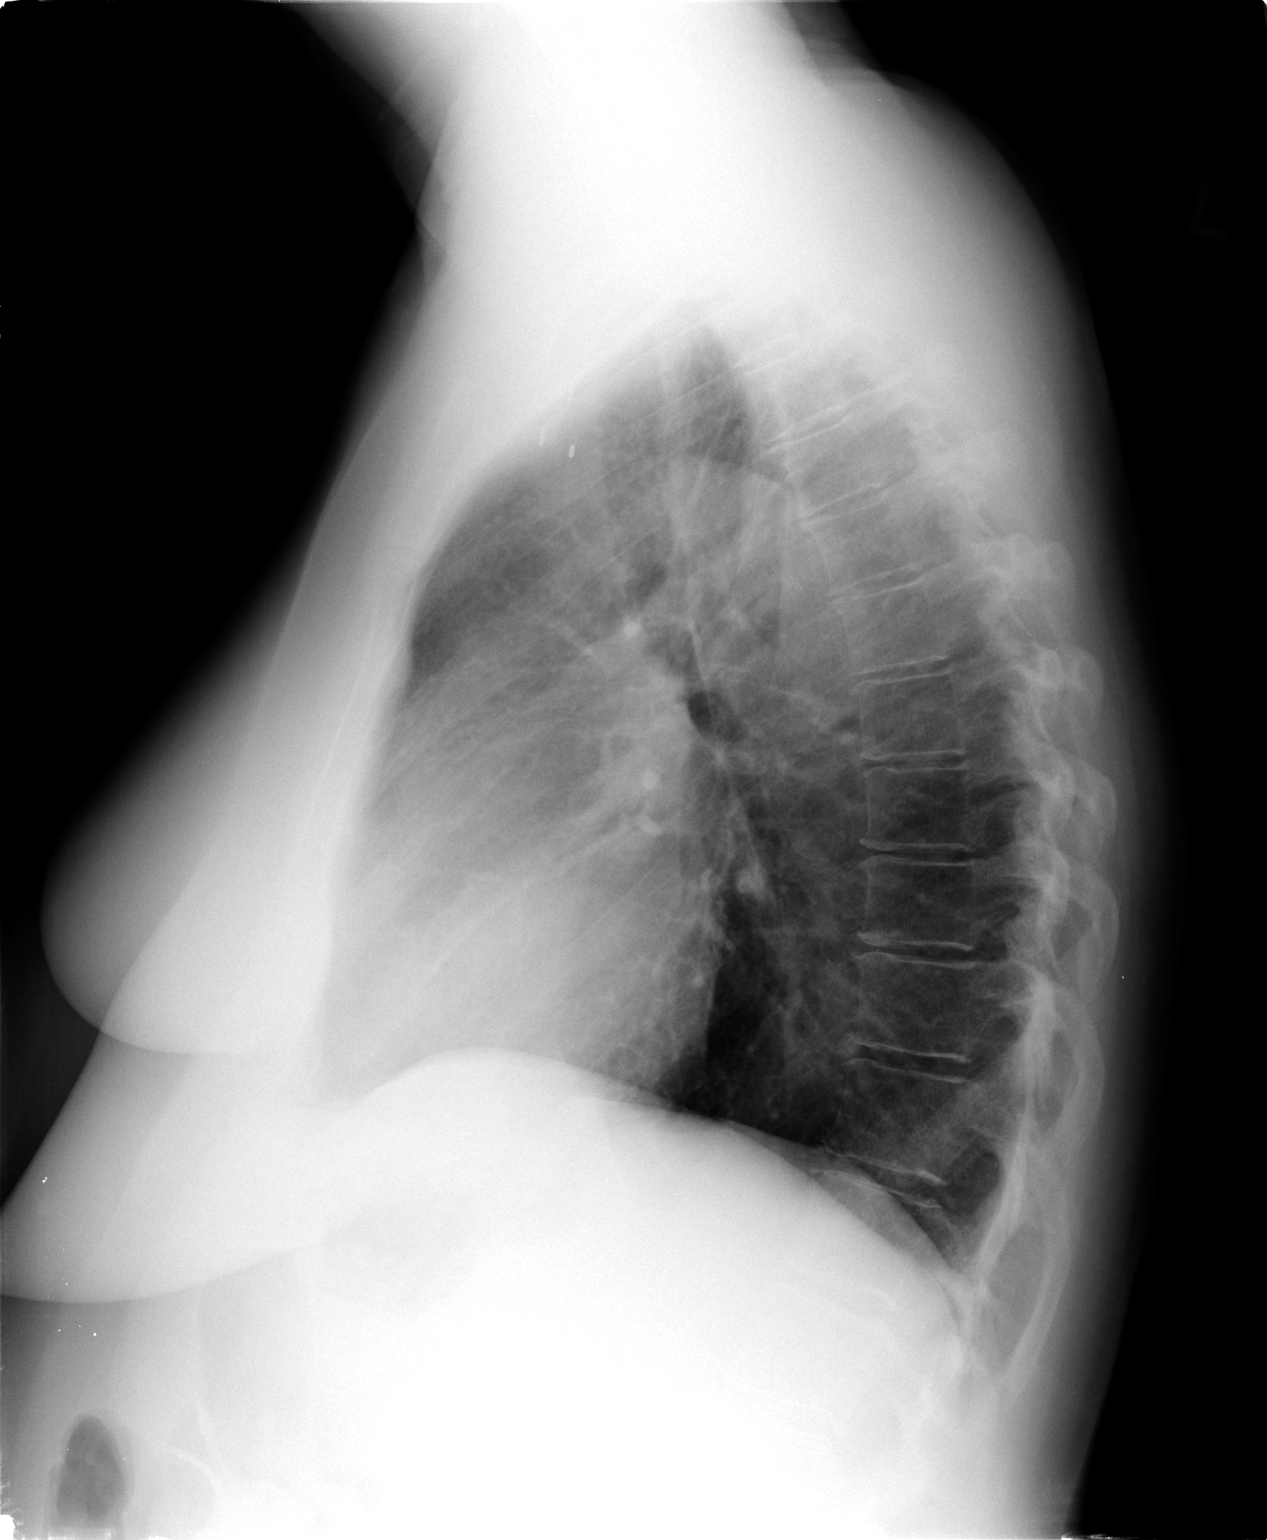

[2 of 2 positions shown; findings below may reference images not displayed]

FINDINGS: Heart size and pulmonary vascularity are normal and the
lungs are clear.  No osseous abnormality.
IMPRESSION: Normal chest.

## 2012-03-13 ENCOUNTER — Telehealth: Payer: Self-pay | Admitting: Internal Medicine

## 2012-03-13 NOTE — Telephone Encounter (Signed)
Yes, she needs both Astelin and Flonase

## 2012-03-13 NOTE — Telephone Encounter (Signed)
Discussed with pt

## 2012-03-13 NOTE — Telephone Encounter (Signed)
Please advise 

## 2012-03-13 NOTE — Telephone Encounter (Signed)
Patient states she is not sure if she should be taking both nasal sprays that Dr. Drue Novel prescribed for her. She would like a call back at [phone].

## 2012-03-17 ENCOUNTER — Institutional Professional Consult (permissible substitution): Payer: Medicare Other | Admitting: Internal Medicine

## 2012-03-19 ENCOUNTER — Encounter: Payer: Self-pay | Admitting: Family Medicine

## 2012-03-19 ENCOUNTER — Ambulatory Visit (INDEPENDENT_AMBULATORY_CARE_PROVIDER_SITE_OTHER): Payer: Medicare Other | Admitting: Family Medicine

## 2012-03-19 VITALS — BP 136/80 | HR 75 | Temp 97.3°F | Ht 63.25 in | Wt 150.8 lb

## 2012-03-19 DIAGNOSIS — S39011A Strain of muscle, fascia and tendon of abdomen, initial encounter: Secondary | ICD-10-CM | POA: Insufficient documentation

## 2012-03-19 DIAGNOSIS — IMO0002 Reserved for concepts with insufficient information to code with codable children: Secondary | ICD-10-CM

## 2012-03-19 MED ORDER — NAPROXEN 500 MG PO TABS: 500.0000 mg | ORAL_TABLET | Freq: Two times a day (BID) | ORAL | Status: AC

## 2012-03-19 MED ORDER — CYCLOBENZAPRINE HCL 10 MG PO TABS: 10.0000 mg | ORAL_TABLET | Freq: Three times a day (TID) | ORAL | Status: DC | PRN

## 2012-03-19 NOTE — Patient Instructions (Addendum)
This appears to be an abdominal wall strain Start the Naproxen twice daily- take w/ food- for the next 5-7 days and then as needed Use the flexeril (muscle relaxer) for night and weekends Heat or ice This will take time (weeks) to completely heal Call with any questions or concerns Hang in there!

## 2012-03-19 NOTE — Progress Notes (Signed)
  Subjective:    Patient ID: Monique Ballard, female    DOB: 07/27/1940, 72 y.o.   MRN: [id number]  HPI L flank pain- 'i've got this really sore side'.  Preceding this had 'chronic cough' but this is somewhat under control.  2 weeks ago developed slight pain w/ cough.  Was able to control w/ advil.  Yesterday reached out to grab car door and developed severe pain.  No pain while sitting still but movement of L arm and cough is 'excrutiating'.  No relief w/ advil.  No burning w/ urination, no hematuria, no urgency or frequency.   Review of Systems For ROS see HPI     Objective:   Physical Exam  Vitals reviewed. Constitutional: She is oriented to person, place, and time. She appears well-developed and well-nourished. No distress.  Cardiovascular: Normal rate, regular rhythm and normal heart sounds.   Pulmonary/Chest: Effort normal and breath sounds normal. No respiratory distress. She has no wheezes. She has no rales. She exhibits no tenderness (no TTP over ribs).  Abdominal: Soft. Bowel sounds are normal. She exhibits no distension. There is tenderness (only w/ contraction of L obliques ). There is no rebound and no guarding.  Musculoskeletal: She exhibits no edema.  Neurological: She is alert and oriented to person, place, and time.          Assessment & Plan:

## 2012-03-20 NOTE — Assessment & Plan Note (Signed)
New.  Pt only having pain w/ contraction of L obliques.  No bony TTP over ribs, no hepatomegaly, no abnormal BS on PE.  Start scheduled NSAIDs, muscle relaxer prn.  Heat.  Reviewed supportive care and red flags that should prompt return.  Pt expressed understanding and is in agreement w/ plan.

## 2012-04-03 ENCOUNTER — Other Ambulatory Visit: Payer: Self-pay | Admitting: Internal Medicine

## 2012-04-14 DIAGNOSIS — L089 Local infection of the skin and subcutaneous tissue, unspecified: Secondary | ICD-10-CM | POA: Diagnosis not present

## 2012-04-28 DIAGNOSIS — D485 Neoplasm of uncertain behavior of skin: Secondary | ICD-10-CM | POA: Diagnosis not present

## 2012-04-28 DIAGNOSIS — D2330 Other benign neoplasm of skin of unspecified part of face: Secondary | ICD-10-CM | POA: Diagnosis not present

## 2012-06-11 ENCOUNTER — Other Ambulatory Visit: Payer: Self-pay | Admitting: Internal Medicine

## 2012-06-11 NOTE — Telephone Encounter (Signed)
Refill done.  

## 2012-07-15 ENCOUNTER — Other Ambulatory Visit: Payer: Self-pay | Admitting: Internal Medicine

## 2012-07-15 NOTE — Telephone Encounter (Signed)
Refill done.  

## 2012-08-15 ENCOUNTER — Other Ambulatory Visit: Payer: Self-pay | Admitting: Internal Medicine

## 2012-08-17 NOTE — Telephone Encounter (Signed)
Refill done per protocol.  

## 2012-09-18 ENCOUNTER — Other Ambulatory Visit: Payer: Self-pay | Admitting: Internal Medicine

## 2012-09-18 NOTE — Telephone Encounter (Signed)
rx refilled per protocol. DJR  

## 2012-09-25 ENCOUNTER — Telehealth: Payer: Self-pay | Admitting: *Deleted

## 2012-09-25 NOTE — Telephone Encounter (Signed)
Currently on Wellbutrin XL 150 mg 2 tablet once a day. I really would like her to continue with Wellbutrin. I'm not opposed temporarily try the non- XL tablet or generics, for instance: Wellbutrin 100 mg 2 tablets in the morning, one in the afternoon. Please discuss with pharmacy. Also, could she inquired availability in other pharmacis?

## 2012-09-25 NOTE — Telephone Encounter (Signed)
Received fax from [facility] that Wellbutrin is on back order until 10/2012.Pt is alos taking Celexa 20mg  qd.  Do you want to substitute another med for Wellbutrin?

## 2012-09-28 NOTE — Telephone Encounter (Signed)
Left message for pt to return our call regarding Wellbutrin she takes on a regular basis. [facility] has it on back order at this time. If she wants to try a different pharmacy, we can certainly do that.

## 2012-09-28 NOTE — Telephone Encounter (Signed)
Again attempted to contact pt regarding Wellbutrin, left message on voice mail.

## 2012-09-30 NOTE — Telephone Encounter (Signed)
Final attempt to contact pt regarding Wellbutrin. Advised pt to call if she did not have enough medication to last until new shipment came in to [facility].

## 2012-11-03 ENCOUNTER — Other Ambulatory Visit: Payer: Self-pay | Admitting: Internal Medicine

## 2012-11-03 NOTE — Telephone Encounter (Signed)
Celexa and Wellbutrin refills sent to pharmacy. OV due

## 2012-11-07 ENCOUNTER — Other Ambulatory Visit: Payer: Self-pay | Admitting: Internal Medicine

## 2012-11-09 ENCOUNTER — Other Ambulatory Visit: Payer: Self-pay | Admitting: *Deleted

## 2012-11-09 MED ORDER — POTASSIUM CHLORIDE CRYS ER 20 MEQ PO TBCR: EXTENDED_RELEASE_TABLET | ORAL | Status: DC

## 2012-11-09 NOTE — Telephone Encounter (Signed)
Potassium refilled, #30, 0 refills. OV/Labs due

## 2012-11-13 ENCOUNTER — Other Ambulatory Visit: Payer: Self-pay | Admitting: Internal Medicine

## 2012-11-16 ENCOUNTER — Other Ambulatory Visit: Payer: Self-pay | Admitting: *Deleted

## 2012-11-16 MED ORDER — OMEPRAZOLE 40 MG PO CPDR: 40.0000 mg | DELAYED_RELEASE_CAPSULE | Freq: Every day | ORAL | Status: DC

## 2012-11-16 NOTE — Telephone Encounter (Signed)
Omeprazole refill sent to pharmacy 

## 2012-11-26 ENCOUNTER — Other Ambulatory Visit: Payer: Self-pay

## 2012-11-26 DIAGNOSIS — Z23 Encounter for immunization: Secondary | ICD-10-CM | POA: Diagnosis not present

## 2012-12-30 ENCOUNTER — Other Ambulatory Visit: Payer: Self-pay

## 2012-12-30 DIAGNOSIS — Z1231 Encounter for screening mammogram for malignant neoplasm of breast: Secondary | ICD-10-CM

## 2013-01-12 ENCOUNTER — Other Ambulatory Visit: Payer: Self-pay | Admitting: Internal Medicine

## 2013-01-12 NOTE — Telephone Encounter (Signed)
rx refilled per protocol. DJR  

## 2013-02-03 ENCOUNTER — Ambulatory Visit: Payer: Medicare Other

## 2013-02-11 ENCOUNTER — Ambulatory Visit: Payer: Medicare Other

## 2013-02-16 ENCOUNTER — Other Ambulatory Visit: Payer: Self-pay | Admitting: Internal Medicine

## 2013-02-24 ENCOUNTER — Other Ambulatory Visit: Payer: Self-pay | Admitting: Internal Medicine

## 2013-02-25 ENCOUNTER — Ambulatory Visit
Admission: RE | Admit: 2013-02-25 | Discharge: 2013-02-25 | Disposition: A | Discharge status: Home / Self Care | Payer: Medicare Other | Source: Ambulatory Visit

## 2013-02-25 DIAGNOSIS — Z1231 Encounter for screening mammogram for malignant neoplasm of breast: Secondary | ICD-10-CM | POA: Diagnosis not present

## 2013-04-01 ENCOUNTER — Other Ambulatory Visit: Payer: Self-pay | Admitting: Internal Medicine

## 2013-05-14 ENCOUNTER — Other Ambulatory Visit: Payer: Self-pay | Admitting: Internal Medicine

## 2013-05-14 NOTE — Telephone Encounter (Signed)
Rx denied b/c pt needs office visit.//AB/CMA

## 2013-06-02 ENCOUNTER — Ambulatory Visit (INDEPENDENT_AMBULATORY_CARE_PROVIDER_SITE_OTHER): Payer: Medicare Other | Admitting: Internal Medicine

## 2013-06-02 ENCOUNTER — Encounter: Payer: Self-pay | Admitting: Internal Medicine

## 2013-06-02 ENCOUNTER — Telehealth: Payer: Self-pay | Admitting: Internal Medicine

## 2013-06-02 VITALS — BP 127/78 | HR 88 | Temp 98.2°F | Wt 140.8 lb

## 2013-06-02 DIAGNOSIS — K219 Gastro-esophageal reflux disease without esophagitis: Secondary | ICD-10-CM

## 2013-06-02 DIAGNOSIS — F329 Major depressive disorder, single episode, unspecified: Secondary | ICD-10-CM | POA: Diagnosis not present

## 2013-06-02 DIAGNOSIS — I10 Essential (primary) hypertension: Secondary | ICD-10-CM

## 2013-06-02 DIAGNOSIS — F3289 Other specified depressive episodes: Secondary | ICD-10-CM | POA: Diagnosis not present

## 2013-06-02 DIAGNOSIS — Z0000 Encounter for general adult medical examination without abnormal findings: Secondary | ICD-10-CM

## 2013-06-02 MED ORDER — HYDROCHLOROTHIAZIDE 25 MG PO TABS: ORAL_TABLET | ORAL | Status: DC

## 2013-06-02 NOTE — Assessment & Plan Note (Signed)
Well-controlled, check a CBC, CMP; RF  medications. Monitor ambulatory BPs. Will also check a TSH, has brittle nails

## 2013-06-02 NOTE — Progress Notes (Signed)
Pre visit review using our clinic review tool, if applicable. No additional management support is needed unless otherwise documented below in the visit note. 

## 2013-06-02 NOTE — Assessment & Plan Note (Signed)
Taking Wellbutrin on citalopram for long time, she ran out 2 weeks ago and feels well. Wonders if she still needed medications. Since she is doing well and is not having any problems due to the abrupt discontinuation of the medications we agreed on  observation. If she has problems, symptoms resurface she will let me know. Will restart at least citalopram.

## 2013-06-02 NOTE — Assessment & Plan Note (Signed)
Well controlled on PPIs 

## 2013-06-02 NOTE — Assessment & Plan Note (Signed)
States she is getting her physical exam at gynecology. Wonders about her weight, BMI is 24, she is doing great

## 2013-06-02 NOTE — Progress Notes (Signed)
Subjective:    Patient ID: Monique Ballard, female    DOB: [birth year], 73 y.o.   MRN: [id number]  DOS:  06/02/2013 Type of  visit:  Routine office visit Depression, run out of Wellbutrin and citalopram approximately 2 weeks ago, she feels well, wonders if she still needs the medication. Hypertension, good medication compliance, needs a refill HCTZ, labs reviewed, due for a BMP GERD, good compliance with Prilosec, essentially asymptomatic.   ROS Denies chest pain, difficulty breathing or lower extremity edema No  nausea, vomiting, diarrhea or blood in the stools.   Past Medical History  Diagnosis Date  . Breast cancer     Dx 1997, lumpectomy, XRT  . HTN (hypertension)   . Depression   . GERD (gastroesophageal reflux disease)     w/"stretching" remotely    Past Surgical History  Procedure Laterality Date  . Breast lumpectomy  1997  . Pelvic fracture surgery      endometriosis, remotely  . Cesarean section      x2    History   Social History  . Marital Status: Married    Spouse Name: N/A    Number of Children: 2  . Years of Education: N/A   Occupational History  . retired    Social History Main Topics  . Smoking status: Former Research scientist (life sciences)  . Smokeless tobacco: Never Used     Comment: Quit >30 yrs ago  . Alcohol Use: Yes     Comment: 1 glass wine dialy  . Drug Use: No  . Sexual Activity: Not on file   Other Topics Concern  . Not on file   Social History Narrative   1 Older Brother   Occupation: retired 2011 from [employer]   2 children, 2 [place]                  Medication List       This list is accurate as of: 06/02/13  8:29 PM.  Always use your most recent med list.               CENTRUM SILVER PO  Take 1 each by mouth daily.     glucosamine-chondroitin 500-400 MG tablet  Take 1 tablet by mouth 3 (three) times daily.     hydrochlorothiazide 25 MG tablet  Commonly known as:  HYDRODIURIL  TAKE 1 TABLET BY MOUTH ONCE A DAY     omeprazole 40 MG capsule  Commonly known as:  PRILOSEC  Take 1 capsule (40 mg total) by mouth daily.     OSCAL 500/200 D-3 500-200 MG-UNIT per tablet  Generic drug:  calcium-vitamin D  Take 1 tablet by mouth daily.     potassium chloride SA 20 MEQ tablet  Commonly known as:  K-DUR,KLOR-CON  TAKE 1 TABLET BY MOUTH DAILY.     Vitamin D3 3000 UNITS Tabs  Take by mouth.           Objective:   Physical Exam BP 127/78  Pulse 88  Temp(Src) 98.2 F (36.8 C)  Wt 140 lb 12.8 oz (63.866 kg)  SpO2 95%  General -- alert, well-developed, NAD.  HEENT-- Not pale.  Lungs -- normal respiratory effort, no intercostal retractions, no accessory muscle use, and normal breath sounds.  Heart-- normal rate, regular rhythm, no murmur.  Extremities-- no pretibial edema bilaterally  Neurologic--  alert & oriented X3. Speech normal, gait normal, strength normal in all extremities.  Psych-- Cognition and judgment appear intact. Cooperative with  normal attention span and concentration. No anxious or depressed appearing.     Assessment & Plan:

## 2013-06-02 NOTE — Telephone Encounter (Signed)
Relevant patient education assigned to patient using Emmi. ° °

## 2013-06-02 NOTE — Patient Instructions (Signed)
Get your blood work before you leave   Next visit is for routine check up regards your   blood pressure  in 1 year,  fasting Please make an appointment     Check the  blood pressure 1 or 2  times a month   be sure it is between 110/60 and 140/85. Ideal blood pressure is 120/80. If it is consistently higher or lower, let me know

## 2013-06-03 LAB — COMPREHENSIVE METABOLIC PANEL
ALBUMIN: 4.2 g/dL (ref 3.5–5.2)
ALT: 17 U/L (ref 0–35)
AST: 22 U/L (ref 0–37)
Alkaline Phosphatase: 65 U/L (ref 39–117)
BUN: 19 mg/dL (ref 6–23)
CALCIUM: 10 mg/dL (ref 8.4–10.5)
CO2: 29 mEq/L (ref 19–32)
Chloride: 105 mEq/L (ref 96–112)
Creatinine, Ser: 0.8 mg/dL (ref 0.4–1.2)
GFR: 75.28 mL/min (ref >60.00)
GLUCOSE: 70 mg/dL (ref 70–99)
POTASSIUM: 4.9 meq/L (ref 3.5–5.1)
Sodium: 141 mEq/L (ref 135–145)
Total Bilirubin: 0.7 mg/dL (ref 0.2–1.2)
Total Protein: 7 g/dL (ref 6.0–8.3)

## 2013-06-03 LAB — CBC WITH DIFFERENTIAL/PLATELET
BASOS PCT: 0.3 % (ref 0.0–3.0)
Basophils Absolute: 0 10*3/uL (ref 0.0–0.1)
EOS PCT: 0.4 % (ref 0.0–5.0)
Eosinophils Absolute: 0 10*3/uL (ref 0.0–0.7)
HCT: 42.3 % (ref 36.0–46.0)
Hemoglobin: 14.2 g/dL (ref 12.0–15.0)
LYMPHS PCT: 25.5 % (ref 12.0–46.0)
Lymphs Abs: 2.8 10*3/uL (ref 0.7–4.0)
MCHC: 33.6 g/dL (ref 30.0–36.0)
MCV: 94 fl (ref 78.0–100.0)
MONOS PCT: 12.2 % — AB (ref 3.0–12.0)
Monocytes Absolute: 1.3 10*3/uL — ABNORMAL HIGH (ref 0.1–1.0)
NEUTROS PCT: 61.6 % (ref 43.0–77.0)
Neutro Abs: 6.7 10*3/uL (ref 1.4–7.7)
Platelets: 300 10*3/uL (ref 150.0–400.0)
RBC: 4.5 Mil/uL (ref 3.87–5.11)
RDW: 12.6 % (ref 11.5–15.5)
WBC: 10.9 10*3/uL — AB (ref 4.0–10.5)

## 2013-06-03 LAB — TSH: TSH: 0.57 u[IU]/mL (ref 0.35–4.50)

## 2013-06-08 DIAGNOSIS — K5730 Diverticulosis of large intestine without perforation or abscess without bleeding: Secondary | ICD-10-CM | POA: Diagnosis not present

## 2013-06-08 DIAGNOSIS — Z1211 Encounter for screening for malignant neoplasm of colon: Secondary | ICD-10-CM | POA: Diagnosis not present

## 2013-06-08 LAB — HM COLONOSCOPY: HM COLON: NEGATIVE

## 2013-06-25 ENCOUNTER — Encounter: Payer: Self-pay | Admitting: Internal Medicine

## 2013-06-28 ENCOUNTER — Other Ambulatory Visit: Payer: Self-pay | Admitting: Internal Medicine

## 2013-07-02 ENCOUNTER — Encounter: Payer: Self-pay | Admitting: Internal Medicine

## 2013-08-18 DIAGNOSIS — H1045 Other chronic allergic conjunctivitis: Secondary | ICD-10-CM | POA: Diagnosis not present

## 2013-08-18 DIAGNOSIS — Z961 Presence of intraocular lens: Secondary | ICD-10-CM | POA: Diagnosis not present

## 2013-09-09 ENCOUNTER — Other Ambulatory Visit: Payer: Self-pay | Admitting: Internal Medicine

## 2013-09-24 ENCOUNTER — Telehealth: Payer: Self-pay | Admitting: Internal Medicine

## 2013-09-24 MED ORDER — BUPROPION HCL ER (XL) 150 MG PO TB24: ORAL_TABLET | ORAL | Status: DC

## 2013-09-24 NOTE — Telephone Encounter (Signed)
She d/c  medications and we agreed to restart it if needed. Plan:  Restart Wellbutrin XL 150mg  1 tablet daily for the first 10 days then one tablet twice a day. (rx will say 2 po qd ) If not improving needs to call the office, may need to reintroduce citalopram as well.

## 2013-09-24 NOTE — Telephone Encounter (Signed)
Spoke with Pt and informed her medication was sent to Pharmacy and instructed her to take medication 1 tablet daily for the first 10 days and then twice daily.

## 2013-09-24 NOTE — Telephone Encounter (Signed)
Caller name:Trease Relation to pt: Call back number:[phone]   Reason for call: pt would like to start taking the Rx buPROPion (WELLBUTRIN XL) 300 MG 24 hr tablet again.  Pt states she had discussed this previously with PCP.

## 2013-09-24 NOTE — Telephone Encounter (Signed)
Please advise 

## 2013-09-29 ENCOUNTER — Telehealth: Payer: Self-pay | Admitting: Internal Medicine

## 2013-09-29 NOTE — Telephone Encounter (Signed)
Advise patient: Discontinue Wellbutrin, if she has severe symptoms go to the ER or needs to be seen in this office. We can restart citalopram 20 mg if so desired. Call citalopram 20 mg: Half tablet for one week, then one tablet daily #30 and one refill Followup 4 weeks

## 2013-09-29 NOTE — Telephone Encounter (Signed)
Please advise 

## 2013-09-29 NOTE — Telephone Encounter (Signed)
Caller name: Dominica, Kent Relation to pt: self  Call back number: [phone]   Reason for call: pt exp shortness of breath, jitters from using buPROPion (WELLBUTRIN XL) 150 MG 24 hr tablet. Please advise

## 2013-09-30 MED ORDER — CITALOPRAM HYDROBROMIDE 20 MG PO TABS: ORAL_TABLET | ORAL | Status: DC

## 2013-09-30 NOTE — Telephone Encounter (Signed)
Spoke with Pt, and gave instructions, medication sent to [facility].

## 2013-10-04 ENCOUNTER — Encounter: Payer: Self-pay | Admitting: Internal Medicine

## 2013-11-26 ENCOUNTER — Other Ambulatory Visit: Payer: Self-pay | Admitting: Internal Medicine

## 2013-12-08 DIAGNOSIS — Z23 Encounter for immunization: Secondary | ICD-10-CM | POA: Diagnosis not present

## 2013-12-29 ENCOUNTER — Other Ambulatory Visit: Payer: Self-pay | Admitting: Internal Medicine

## 2014-01-12 ENCOUNTER — Other Ambulatory Visit: Payer: Self-pay | Admitting: Internal Medicine

## 2014-01-28 ENCOUNTER — Other Ambulatory Visit: Payer: Self-pay

## 2014-01-28 DIAGNOSIS — Z1231 Encounter for screening mammogram for malignant neoplasm of breast: Secondary | ICD-10-CM

## 2014-02-09 ENCOUNTER — Encounter: Payer: Self-pay | Admitting: Internal Medicine

## 2014-02-09 ENCOUNTER — Ambulatory Visit (INDEPENDENT_AMBULATORY_CARE_PROVIDER_SITE_OTHER): Payer: Medicare Other | Admitting: Internal Medicine

## 2014-02-09 ENCOUNTER — Other Ambulatory Visit: Payer: Self-pay

## 2014-02-09 VITALS — BP 154/93 | HR 76 | Temp 98.0°F | Ht 63.0 in | Wt 148.5 lb

## 2014-02-09 DIAGNOSIS — F418 Other specified anxiety disorders: Secondary | ICD-10-CM

## 2014-02-09 DIAGNOSIS — F419 Anxiety disorder, unspecified: Principal | ICD-10-CM

## 2014-02-09 DIAGNOSIS — J069 Acute upper respiratory infection, unspecified: Secondary | ICD-10-CM

## 2014-02-09 DIAGNOSIS — F329 Major depressive disorder, single episode, unspecified: Secondary | ICD-10-CM

## 2014-02-09 MED ORDER — CITALOPRAM HYDROBROMIDE 20 MG PO TABS: ORAL_TABLET | ORAL | Status: DC

## 2014-02-09 NOTE — Progress Notes (Signed)
Pre visit review using our clinic review tool, if applicable. No additional management support is needed unless otherwise documented below in the visit note. 

## 2014-02-09 NOTE — Assessment & Plan Note (Addendum)
After the last visit, she discontinue Wellbutrin and citalopram but her son had a number of problems which created a lot of stress; she had symptoms again, restart   Wellbutrin, shortly after she developed panic attacks. Subsequently she restarted   Citalopram and d/c wellbutrin and  Is now doing well  . Plan: Continue with citalopram, come back in 6 months

## 2014-02-09 NOTE — Patient Instructions (Signed)
Continue taking DayQuil and NyQuil as needed Use OTC Nasocort or Flonase : 2 nasal sprays on each side of the nose daily until you feel better   Call if not gradually better over the next  10 days Call anytime if the symptoms are severe  Next visit in 6 months, please make an appointment

## 2014-02-09 NOTE — Progress Notes (Signed)
Subjective:    Patient ID: Monique Ballard, female    DOB: [birth year], 74 y.o.   MRN: [id number]  DOS:  02/09/2014 Type of visit - description : Routine visit Interval history: Recently had anxiety and depression, well-controlled on citalopram. Developed URI 10 days ago, DayQuil and NyQuil helping. Wonders if she could do something else besides OTCs.    ROS No fever chills. Mild cough with no sputum production + Sinus drainage.  Past Medical History  Diagnosis Date  . Breast cancer     Dx 1997, lumpectomy, XRT  . HTN (hypertension)   . Depression   . GERD (gastroesophageal reflux disease)     w/"stretching" remotely    Past Surgical History  Procedure Laterality Date  . Breast lumpectomy  1997  . Pelvic fracture surgery      endometriosis, remotely  . Cesarean section      x2    History   Social History  . Marital Status: Married    Spouse Name: N/A    Number of Children: 2  . Years of Education: N/A   Occupational History  . retired    Social History Main Topics  . Smoking status: Former Research scientist (life sciences)  . Smokeless tobacco: Never Used     Comment: Quit >30 yrs ago  . Alcohol Use: Yes     Comment: 1 glass wine dialy  . Drug Use: No  . Sexual Activity: Not on file   Other Topics Concern  . Not on file   Social History Narrative   1 Older Brother   Occupation: retired 2011 from [place] office   2 children, 2 [place]                  Medication List       This list is accurate as of: 02/09/14  8:09 PM.  Always use your most recent med list.               Biotin 10 MG Tabs  Take 1 tablet by mouth daily.     CENTRUM SILVER PO  Take 1 each by mouth daily.     citalopram 20 MG tablet  Commonly known as:  CELEXA  TAKE 1 TABLET BY MOUTH DAILY.     Fish Oil 1200 MG Caps  Take 2 capsules by mouth daily.     glucosamine-chondroitin 500-400 MG tablet  Take 1 tablet by mouth 3 (three) times daily.     hydrochlorothiazide 25 MG tablet    Commonly known as:  HYDRODIURIL  TAKE 1 TABLET BY MOUTH ONCE A DAY     omeprazole 40 MG capsule  Commonly known as:  PRILOSEC  TAKE 1 CAPSULE BY MOUTH DAILY.     OSCAL 500/200 D-3 500-200 MG-UNIT per tablet  Generic drug:  calcium-vitamin D  Take 1 tablet by mouth daily.     potassium chloride SA 20 MEQ tablet  Commonly known as:  K-DUR,KLOR-CON  TAKE 1 TABLET BY MOUTH DAILY.           Objective:   Physical Exam BP 154/93 mmHg  Pulse 76  Temp(Src) 98 F (36.7 C) (Oral)  Ht 5\' 3"  (1.6 m)  Wt 148 lb 8 oz (67.359 kg)  BMI 26.31 kg/m2  SpO2 94% General -- alert, well-developed, NAD.   HEENT-- Not pale.  R Ear-- normal L ear-- normal Throat symmetric, no redness or discharge. Face symmetric, sinuses not tender to palpation. Nose slt  congested. Lungs -- normal  respiratory effort, no intercostal retractions, no accessory muscle use, and normal breath sounds.  Heart-- normal rate, regular rhythm, no murmur.  Neurologic--  alert & oriented X3. Speech normal, gait appropriate for age, strength symmetric and appropriate for age.  Psych-- Cognition and judgment appear intact. Cooperative with normal attention span and concentration. No anxious or depressed appearing.       Assessment & Plan:   URI, Continue with conservative treatment, add Flonase, if not better will let us know. Antibiotics?

## 2014-02-28 ENCOUNTER — Ambulatory Visit
Admission: RE | Admit: 2014-02-28 | Discharge: 2014-02-28 | Disposition: A | Discharge status: Home / Self Care | Payer: Medicare Other | Source: Ambulatory Visit

## 2014-02-28 DIAGNOSIS — Z1231 Encounter for screening mammogram for malignant neoplasm of breast: Secondary | ICD-10-CM | POA: Diagnosis not present

## 2014-03-08 ENCOUNTER — Encounter: Payer: Self-pay | Admitting: Internal Medicine

## 2014-06-30 ENCOUNTER — Other Ambulatory Visit: Payer: Self-pay | Admitting: Internal Medicine

## 2014-07-18 ENCOUNTER — Other Ambulatory Visit: Payer: Self-pay

## 2014-08-10 ENCOUNTER — Encounter: Payer: Self-pay | Admitting: Internal Medicine

## 2014-08-10 ENCOUNTER — Ambulatory Visit (INDEPENDENT_AMBULATORY_CARE_PROVIDER_SITE_OTHER): Payer: Medicare Other | Admitting: Internal Medicine

## 2014-08-10 VITALS — BP 124/68 | HR 68 | Temp 98.1°F | Ht 63.0 in | Wt 147.1 lb

## 2014-08-10 DIAGNOSIS — Z23 Encounter for immunization: Secondary | ICD-10-CM

## 2014-08-10 DIAGNOSIS — Z0000 Encounter for general adult medical examination without abnormal findings: Secondary | ICD-10-CM

## 2014-08-10 DIAGNOSIS — I10 Essential (primary) hypertension: Secondary | ICD-10-CM

## 2014-08-10 DIAGNOSIS — F418 Other specified anxiety disorders: Secondary | ICD-10-CM | POA: Diagnosis not present

## 2014-08-10 DIAGNOSIS — F419 Anxiety disorder, unspecified: Secondary | ICD-10-CM

## 2014-08-10 DIAGNOSIS — F32A Depression, unspecified: Secondary | ICD-10-CM

## 2014-08-10 DIAGNOSIS — F329 Major depressive disorder, single episode, unspecified: Secondary | ICD-10-CM

## 2014-08-10 LAB — CBC WITH DIFFERENTIAL/PLATELET
BASOS PCT: 0.8 % (ref 0.0–3.0)
Basophils Absolute: 0 10*3/uL (ref 0.0–0.1)
EOS PCT: 1.6 % (ref 0.0–5.0)
Eosinophils Absolute: 0.1 10*3/uL (ref 0.0–0.7)
HCT: 44.3 % (ref 36.0–46.0)
HEMOGLOBIN: 15.2 g/dL — AB (ref 12.0–15.0)
Lymphocytes Relative: 39 % (ref 12.0–46.0)
Lymphs Abs: 2.2 10*3/uL (ref 0.7–4.0)
MCHC: 34.3 g/dL (ref 30.0–36.0)
MCV: 92.9 fl (ref 78.0–100.0)
Monocytes Absolute: 0.7 10*3/uL (ref 0.1–1.0)
Monocytes Relative: 12.2 % — ABNORMAL HIGH (ref 3.0–12.0)
Neutro Abs: 2.7 10*3/uL (ref 1.4–7.7)
Neutrophils Relative %: 46.4 % (ref 43.0–77.0)
Platelets: 327 10*3/uL (ref 150.0–400.0)
RBC: 4.77 Mil/uL (ref 3.87–5.11)
RDW: 12.9 % (ref 11.5–15.5)
WBC: 5.7 10*3/uL (ref 4.0–10.5)

## 2014-08-10 LAB — BASIC METABOLIC PANEL
BUN: 13 mg/dL (ref 6–23)
CHLORIDE: 102 meq/L (ref 96–112)
CO2: 27 mEq/L (ref 19–32)
CREATININE: 0.73 mg/dL (ref 0.40–1.20)
Calcium: 9.7 mg/dL (ref 8.4–10.5)
GFR: 83.39 mL/min (ref >60.00)
GLUCOSE: 91 mg/dL (ref 70–99)
Potassium: 4.5 mEq/L (ref 3.5–5.1)
Sodium: 137 mEq/L (ref 135–145)

## 2014-08-10 MED ORDER — HYDROCHLOROTHIAZIDE 25 MG PO TABS: 25.0000 mg | ORAL_TABLET | Freq: Every day | ORAL | Status: DC

## 2014-08-10 MED ORDER — CITALOPRAM HYDROBROMIDE 20 MG PO TABS: 20.0000 mg | ORAL_TABLET | Freq: Every day | ORAL | Status: DC

## 2014-08-10 MED ORDER — POTASSIUM CHLORIDE CRYS ER 20 MEQ PO TBCR: 20.0000 meq | EXTENDED_RELEASE_TABLET | Freq: Every day | ORAL | Status: DC

## 2014-08-10 NOTE — Assessment & Plan Note (Signed)
Seems well-controlled, check a BMP and CBC, refill medications

## 2014-08-10 NOTE — Assessment & Plan Note (Addendum)
Plans to see her gynecologist only for female care. Plan  Prevnar today We discussed calcium and vitamin D supplements, concern about taking a high dose of calcium: okay to take a low dose of calcium (600 mg qd) Return to the office in 4 months for a physical

## 2014-08-10 NOTE — Progress Notes (Signed)
Subjective:    Patient ID: Monique Ballard, female    DOB: 02/13/1940, 74 y.o.   MRN: [id number]  DOS:  08/10/2014 Type of visit - description :  Routine follow-up Interval history: Doing well, good compliance with citalopram, symptoms are well-controlled. HTN, good compliance of medication, ambulatory BPs when checked are normal   Review of Systems No chest pain or difficulty breathing No nausea, vomiting or diarrhea  Past Medical History  Diagnosis Date  . Breast cancer     Dx 1997, lumpectomy, XRT  . HTN (hypertension)   . Depression   . GERD (gastroesophageal reflux disease)     w/"stretching" remotely    Past Surgical History  Procedure Laterality Date  . Breast lumpectomy  1997  . Pelvic fracture surgery      endometriosis, remotely  . Cesarean section      x2    History   Social History  . Marital Status: Married    Spouse Name: N/A  . Number of Children: 2  . Years of Education: N/A   Occupational History  . retired    Social History Main Topics  . Smoking status: Former Research scientist (life sciences)  . Smokeless tobacco: Never Used     Comment: Quit >30 yrs ago  . Alcohol Use: Yes     Comment: 1 glass wine dialy  . Drug Use: No  . Sexual Activity: Not on file   Other Topics Concern  . Not on file   Social History Narrative   1 Older Brother   Occupation: retired 2011 from [place] office   2 children, 2 [place]                  Medication List       This list is accurate as of: 08/10/14  5:33 PM.  Always use your most recent med list.               Biotin 10 MG Tabs  Take 1 tablet by mouth daily.     CENTRUM SILVER PO  Take 1 each by mouth daily.     citalopram 20 MG tablet  Commonly known as:  CELEXA  Take 1 tablet (20 mg total) by mouth daily.     Fish Oil 1200 MG Caps  Take 1 capsule by mouth daily.     glucosamine-chondroitin 500-400 MG tablet  Take 1 tablet by mouth 3 (three) times daily.     hydrochlorothiazide 25 MG tablet    Commonly known as:  HYDRODIURIL  Take 1 tablet (25 mg total) by mouth daily.     omeprazole 20 MG capsule  Commonly known as:  PRILOSEC  Take 20 mg by mouth every other day.     OSCAL 500/200 D-3 500-200 MG-UNIT per tablet  Generic drug:  calcium-vitamin D  Take 1 tablet by mouth daily.     potassium chloride SA 20 MEQ tablet  Commonly known as:  K-DUR,KLOR-CON  Take 1 tablet (20 mEq total) by mouth daily.           Objective:   Physical Exam BP 124/68 mmHg  Pulse 68  Temp(Src) 98.1 F (36.7 C) (Oral)  Ht 5\' 3"  (1.6 m)  Wt 147 lb 2 oz (66.735 kg)  BMI 26.07 kg/m2  SpO2 96%    General:   Well developed, well nourished . NAD.  HEENT:  Normocephalic . Face symmetric, atraumatic Lungs:  CTA B Normal respiratory effort, no intercostal retractions, no accessory muscle use.  Heart: RRR,  no murmur.  No pretibial edema bilaterally  Skin: Not pale. Not jaundice Neurologic:  alert & oriented X3.  Speech normal, gait appropriate for age and unassisted Psych--  Cognition and judgment appear intact.  Cooperative with normal attention span and concentration.  Behavior appropriate. No anxious or depressed appearing.   Assessment & Plan:

## 2014-08-10 NOTE — Patient Instructions (Signed)
Get your blood work before you leave    

## 2014-08-10 NOTE — Progress Notes (Signed)
Pre visit review using our clinic review tool, if applicable. No additional management support is needed unless otherwise documented below in the visit note. 

## 2014-08-10 NOTE — Assessment & Plan Note (Addendum)
Feeling very well on citalopram, stress has decreased, her son is doing better. Plan: Refill medications, continue present care

## 2014-10-01 ENCOUNTER — Encounter (HOSPITAL_COMMUNITY): Payer: Self-pay | Admitting: *Deleted

## 2014-10-01 ENCOUNTER — Emergency Department (HOSPITAL_COMMUNITY)
Admission: EM | Admit: 2014-10-01 | Discharge: 2014-10-01 | Disposition: A | Discharge status: Home / Self Care | Payer: Medicare Other | Attending: Emergency Medicine | Admitting: Emergency Medicine

## 2014-10-01 ENCOUNTER — Emergency Department (HOSPITAL_COMMUNITY): Payer: Medicare Other

## 2014-10-01 DIAGNOSIS — W01198A Fall on same level from slipping, tripping and stumbling with subsequent striking against other object, initial encounter: Secondary | ICD-10-CM | POA: Diagnosis not present

## 2014-10-01 DIAGNOSIS — K219 Gastro-esophageal reflux disease without esophagitis: Secondary | ICD-10-CM | POA: Diagnosis not present

## 2014-10-01 DIAGNOSIS — Y998 Other external cause status: Secondary | ICD-10-CM | POA: Insufficient documentation

## 2014-10-01 DIAGNOSIS — Z23 Encounter for immunization: Secondary | ICD-10-CM | POA: Diagnosis not present

## 2014-10-01 DIAGNOSIS — Y92009 Unspecified place in unspecified non-institutional (private) residence as the place of occurrence of the external cause: Secondary | ICD-10-CM | POA: Insufficient documentation

## 2014-10-01 DIAGNOSIS — F329 Major depressive disorder, single episode, unspecified: Secondary | ICD-10-CM | POA: Diagnosis not present

## 2014-10-01 DIAGNOSIS — I10 Essential (primary) hypertension: Secondary | ICD-10-CM | POA: Diagnosis not present

## 2014-10-01 DIAGNOSIS — Z880 Allergy status to penicillin: Secondary | ICD-10-CM | POA: Insufficient documentation

## 2014-10-01 DIAGNOSIS — W19XXXA Unspecified fall, initial encounter: Secondary | ICD-10-CM

## 2014-10-01 DIAGNOSIS — S0191XA Laceration without foreign body of unspecified part of head, initial encounter: Secondary | ICD-10-CM | POA: Diagnosis not present

## 2014-10-01 DIAGNOSIS — S0101XA Laceration without foreign body of scalp, initial encounter: Secondary | ICD-10-CM | POA: Insufficient documentation

## 2014-10-01 DIAGNOSIS — Z853 Personal history of malignant neoplasm of breast: Secondary | ICD-10-CM | POA: Insufficient documentation

## 2014-10-01 DIAGNOSIS — Y9389 Activity, other specified: Secondary | ICD-10-CM | POA: Insufficient documentation

## 2014-10-01 DIAGNOSIS — S0990XA Unspecified injury of head, initial encounter: Secondary | ICD-10-CM | POA: Diagnosis present

## 2014-10-01 DIAGNOSIS — Z87891 Personal history of nicotine dependence: Secondary | ICD-10-CM | POA: Diagnosis not present

## 2014-10-01 DIAGNOSIS — Z79899 Other long term (current) drug therapy: Secondary | ICD-10-CM | POA: Insufficient documentation

## 2014-10-01 IMAGING — CT CT HEAD W/O CM
2 series · 16 of 30 positions shown, 19 images · non-contrast
Comparison: None.

CLINICAL DATA: Patient fell tonight at home. Struck head on [place]
floor. Laceration to the right side of the head. No loss of
consciousness.

EXAM:
CT HEAD WITHOUT CONTRAST
TECHNIQUE: Contiguous axial images were obtained from the base of the skull
through the vertex without intravenous contrast.

[Series 2: head w/o · axial · non-contrast · 0.45mm/px · z∈[-151,-36]mm · 9 of 29 slices shown, 12 images]
[im 3/29  brain]
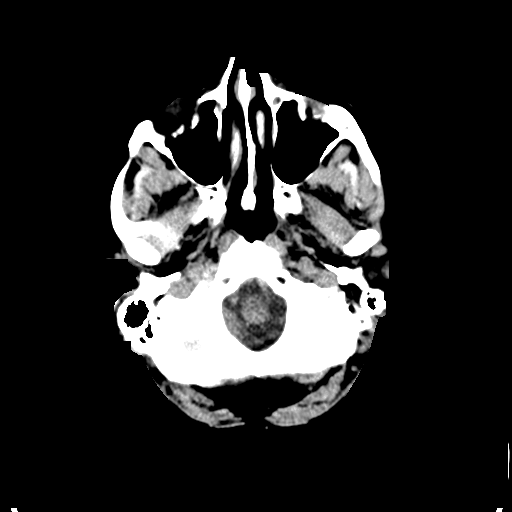
[im 3/29  bone]
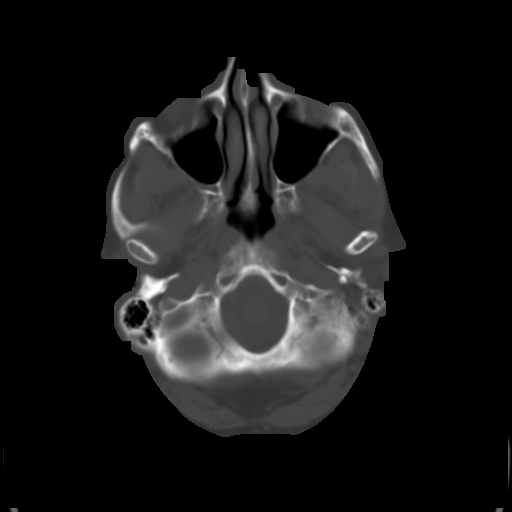
[im 6/29  brain]
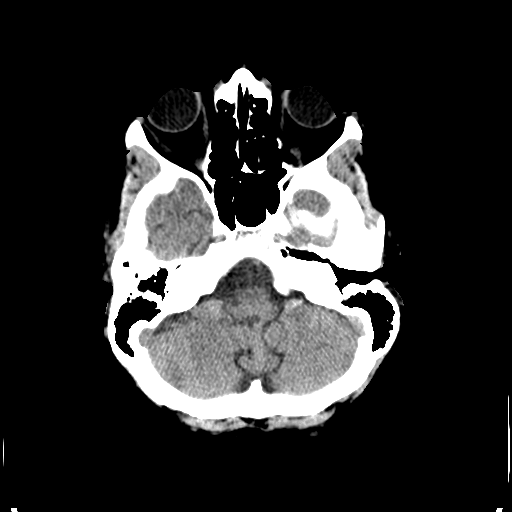
[im 9/29  brain]
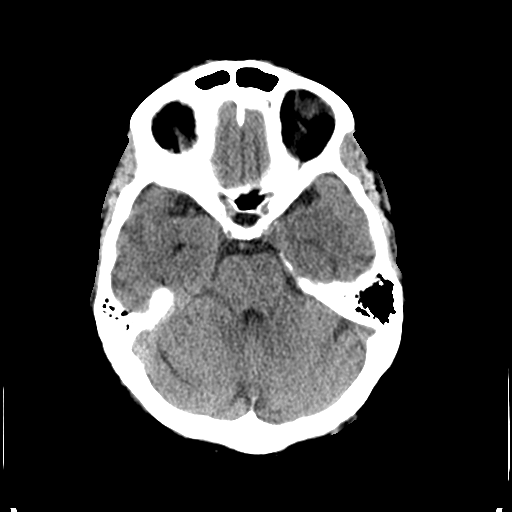
[im 12/29  brain]
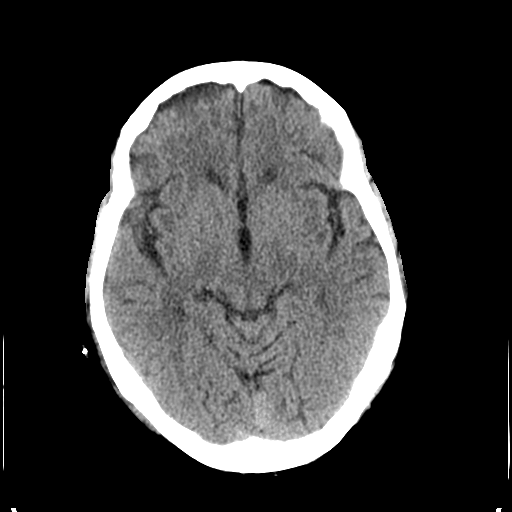
[im 15/29  brain]
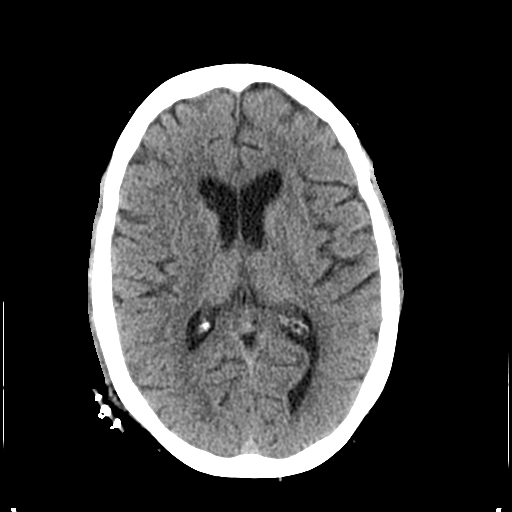
[im 15/29  bone]
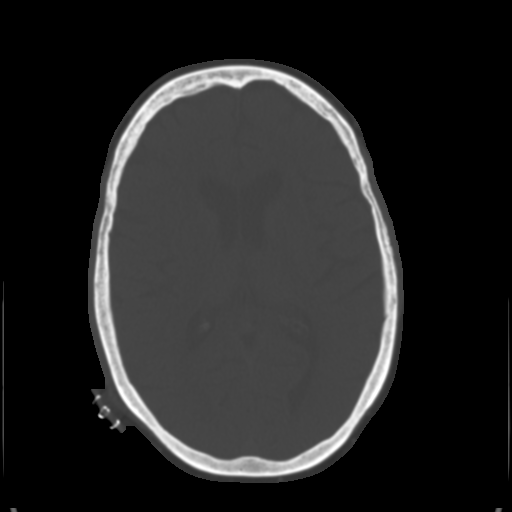
[im 17/29  brain]
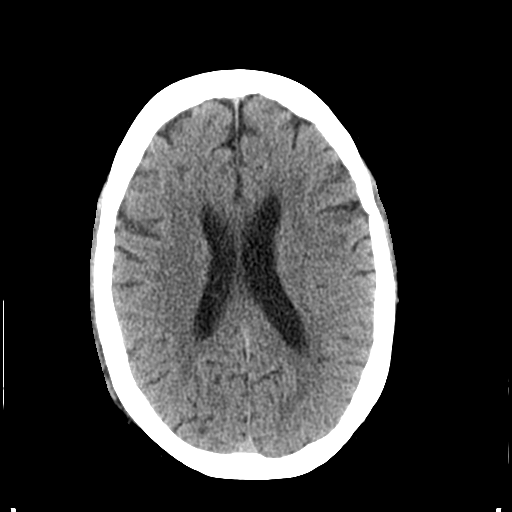
[im 20/29  brain]
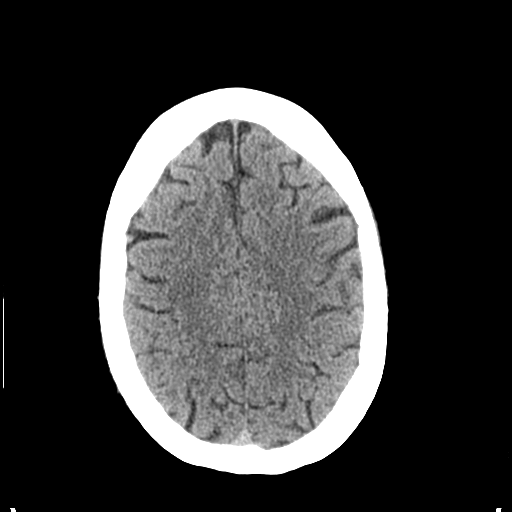
[im 23/29  brain]
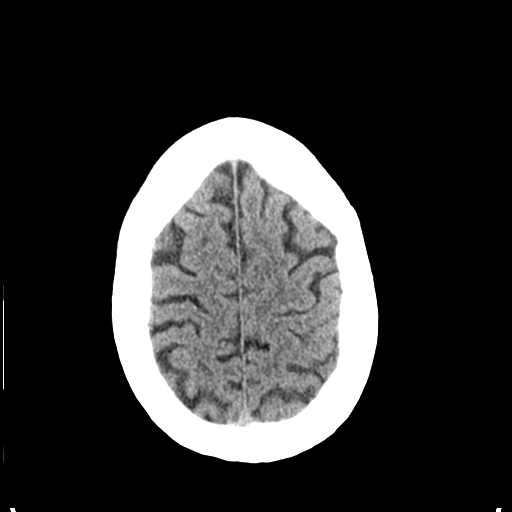
[im 26/29  brain]
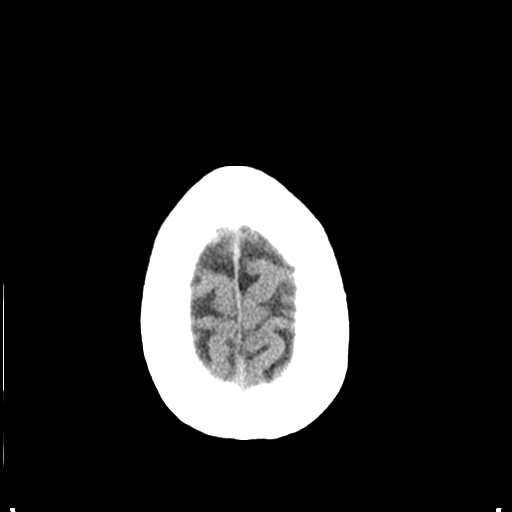
[im 26/29  bone]
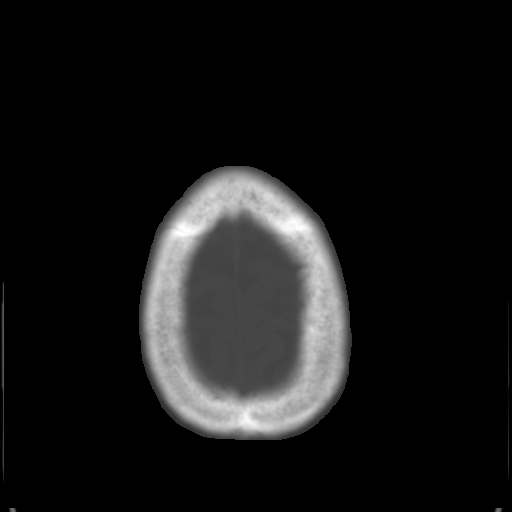

[Series 3: bone windows · axial · 0.45mm/px · z∈[-146,-53]mm · 7 of 48 slices shown]
[im 6/48  bone]
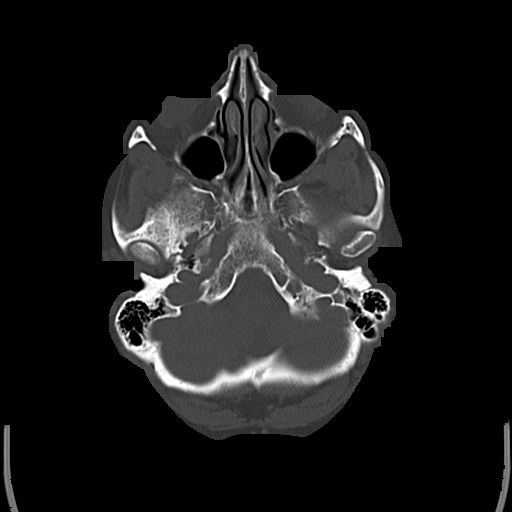
[im 11/48  bone]
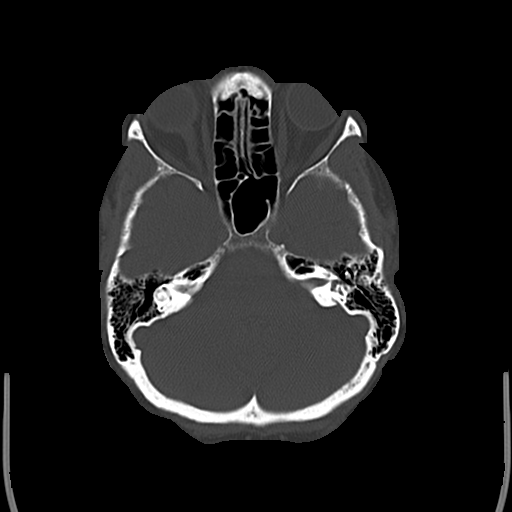
[im 16/48  bone]
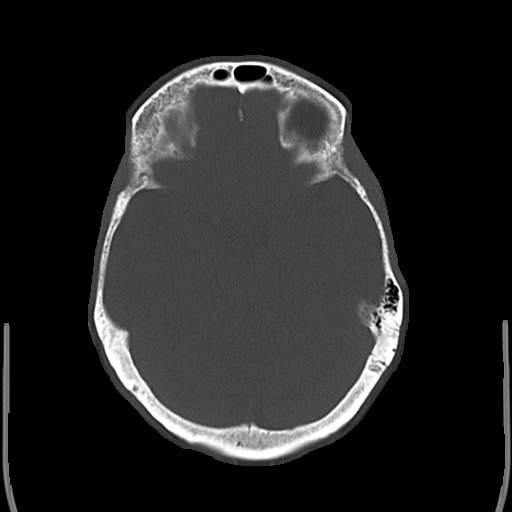
[im 21/48  bone]
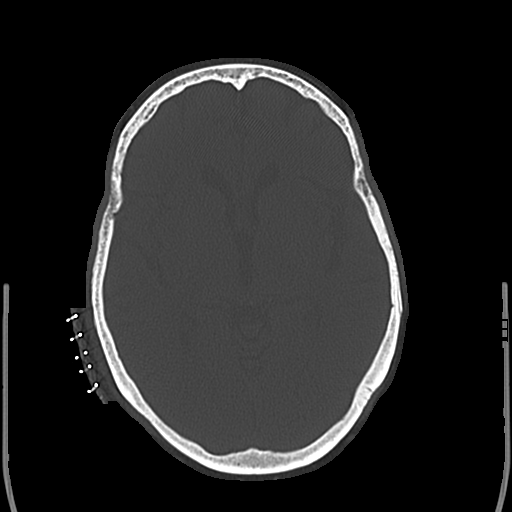
[im 27/48  bone]
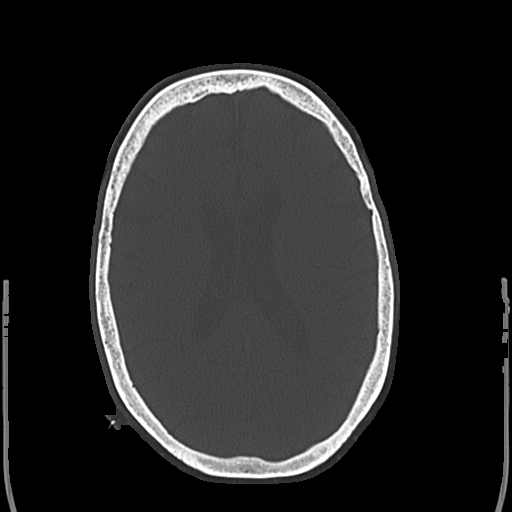
[im 32/48  bone]
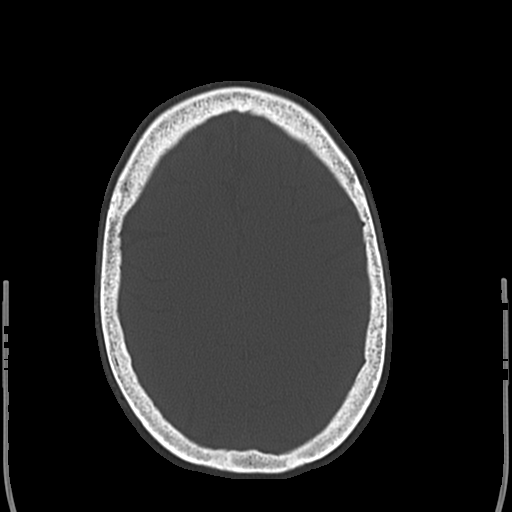
[im 37/48  bone]
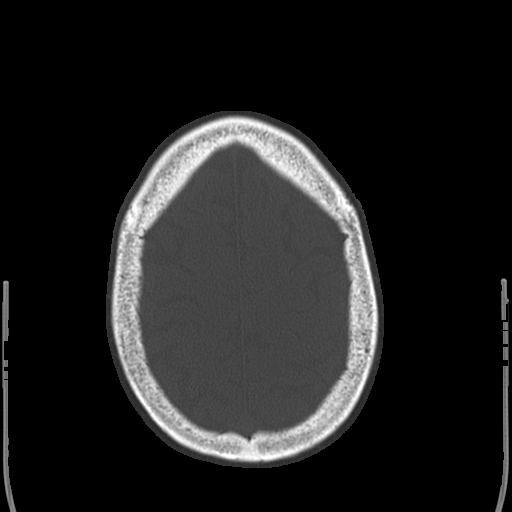

[16 of 30 positions shown; findings below may reference images not displayed]

FINDINGS: Mild diffuse cerebral atrophy. No ventricular dilatation. Mild
low-attenuation change in the deep white matter suggesting small
vessel ischemic change. No mass effect or midline shift. No abnormal
extra-axial fluid collections. Gray-white matter junctions are
distinct. Basal cisterns are not effaced. No evidence of acute
intracranial hemorrhage. No depressed skull fractures. Visualized
paranasal sinuses and mastoid air cells are not opacified. Skin
clips over the right posterior parietal region.
IMPRESSION: No acute intracranial abnormalities. Mild chronic atrophy and small
vessel ischemic changes.

## 2014-10-01 MED ORDER — ACETAMINOPHEN 500 MG PO TABS: 500.0000 mg | ORAL_TABLET | Freq: Four times a day (QID) | ORAL | Status: DC | PRN

## 2014-10-01 MED ORDER — TETANUS-DIPHTH-ACELL PERTUSSIS 5-2.5-18.5 LF-MCG/0.5 IM SUSP
0.5000 mL | Freq: Once | INTRAMUSCULAR | Status: AC
  Administered 2014-10-01: 0.5 mL via INTRAMUSCULAR
  Filled 2014-10-01: qty 0.5

## 2014-10-01 NOTE — ED Provider Notes (Signed)
LACERATION REPAIR Performed by: Garald Balding Authorized by: Garald Balding Consent: Verbal consent obtained. Risks and benefits: risks, benefits and alternatives were discussed Consent given by: patient Patient identity confirmed: provided demographic data Prepped and Draped in normal sterile fashion Wound explored Cleaned with wound cleanse  Laceration Location: scalp  Laceration Length: 7cm  No Foreign Bodies seen or palpated  Anesthesia: local infiltration  Local anesthetic:none  Anesthetic total: o  Irrigation method: syringe Amount of cleaning: standard  Skin closure: Staples  Number of sutures: 10  Technique: Simple   Patient tolerance: Patient tolerated the procedure well with no immediate complications.  Junius Creamer, NP 10/01/14 [place], MD 10/01/14 [phone]

## 2014-10-01 NOTE — ED Notes (Signed)
Pt states she had too much wine and when she got up to use the bathroom she fell striking her head on the hardwood floor, she has approximately a 2 inch laceration on right side of head,  Denies LOC

## 2014-10-01 NOTE — ED Provider Notes (Signed)
CSN: [id number]   Arrival date & time 10/01/14 0305  History  This chart was scribed for Monique Hacker, MD by Altamease Oiler, ED Scribe. This patient was seen in room WA25/WA25 and the patient's care was started at 3:37 AM.  Chief Complaint  Patient presents with  . Fall  . Head Laceration    HPI The history is provided by the patient. No language interpreter was used.   Monique Ballard is a 74 y.o. female who presents to the Emergency Department complaining of a fall tonight at home. Pt states that she had too much wine and when she stood up to use the bathroom she was dizzy before falling to the ground.  She struck her head on the hard wood floor. Associated symptoms include laceration at the ride side of the head. Pt denies LOC, neck pain, and other injury. No anticoagulation.  She is unsure when she last had a tetanus shot.   Past Medical History  Diagnosis Date  . Breast cancer     Dx 1997, lumpectomy, XRT  . HTN (hypertension)   . Depression   . GERD (gastroesophageal reflux disease)     w/"stretching" remotely    Past Surgical History  Procedure Laterality Date  . Breast lumpectomy  1997  . Pelvic fracture surgery      endometriosis, remotely  . Cesarean section      x2    Family History  Problem Relation Age of Onset  . Hypertension Mother   . Stroke Mother   . Heart attack Father   . Heart disease Father     CHF  . Breast cancer Neg Hx   . Colon cancer Neg Hx   . Diabetes Neg Hx   . Throat cancer Other     Social History  Substance Use Topics  . Smoking status: Former Research scientist (life sciences)  . Smokeless tobacco: Never Used     Comment: Quit >30 yrs ago  . Alcohol Use: Yes     Comment: 1 glass wine dialy     Review of Systems  Constitutional: Negative for fever.  Respiratory: Negative for cough, chest tightness and shortness of breath.   Cardiovascular: Negative for chest pain.  Gastrointestinal: Negative for nausea, vomiting and abdominal pain.  Genitourinary:  Negative for dysuria.  Musculoskeletal: Negative for back pain and neck pain.  Skin: Positive for wound.  Neurological: Positive for dizziness. Negative for headaches.  Psychiatric/Behavioral: Negative for confusion.  All other systems reviewed and are negative.  Home Medications   Prior to Admission medications   Medication Sig Start Date End Date Taking? Authorizing Provider  Biotin 10 MG TABS Take 1 tablet by mouth daily.   Yes Historical Provider, MD  calcium-vitamin D (OSCAL 500/200 D-3) 500-200 MG-UNIT per tablet Take 1 tablet by mouth daily.     Yes Historical Provider, MD  citalopram (CELEXA) 20 MG tablet Take 1 tablet (20 mg total) by mouth daily. 08/10/14  Yes Colon Branch, MD  glucosamine-chondroitin 500-400 MG tablet Take 1 tablet by mouth 3 (three) times daily.   Yes Historical Provider, MD  hydrochlorothiazide (HYDRODIURIL) 25 MG tablet Take 1 tablet (25 mg total) by mouth daily. 08/10/14  Yes Colon Branch, MD  ibuprofen (ADVIL,MOTRIN) 200 MG tablet Take 400 mg by mouth every 4 (four) hours as needed for fever or moderate pain.   Yes Historical Provider, MD  Multiple Vitamins-Minerals (CENTRUM SILVER PO) Take 1 each by mouth daily.     Yes Historical  Provider, MD  Omega-3 Fatty Acids (FISH OIL) 1200 MG CAPS Take 1 capsule by mouth daily.    Yes Historical Provider, MD  omeprazole (PRILOSEC) 20 MG capsule Take 20 mg by mouth every other day.   Yes Historical Provider, MD  potassium chloride SA (K-DUR,KLOR-CON) 20 MEQ tablet Take 1 tablet (20 mEq total) by mouth daily. 08/10/14  Yes Colon Branch, MD  acetaminophen (TYLENOL) 500 MG tablet Take 1 tablet (500 mg total) by mouth every 6 (six) hours as needed. 10/01/14   Monique Hacker, MD    Allergies  Penicillins  Triage Vitals: BP 178/94 mmHg  Pulse 81  Temp(Src) 97.5 F (36.4 C) (Oral)  Resp 20  SpO2 97%  Physical Exam  Constitutional: She is oriented to person, place, and time. She appears well-developed and well-nourished. No  distress.  Appears younger than stated age  HENT:  Head: Normocephalic.  7 cm linear laceration just right of the occiput, hemostatic  Eyes: Pupils are equal, round, and reactive to light.  Neck: Normal range of motion. Neck supple.  No midline C-spine tenderness, step-off, or deformity  Cardiovascular: Normal rate, regular rhythm and normal heart sounds.   Pulmonary/Chest: Effort normal and breath sounds normal. No respiratory distress. She has no wheezes.  Abdominal: Soft. Bowel sounds are normal. There is no tenderness. There is no rebound.  Neurological: She is alert and oriented to person, place, and time.  Skin: Skin is warm and dry.  Psychiatric: She has a normal mood and affect.  Nursing note and vitals reviewed.   ED Course  Procedures   DIAGNOSTIC STUDIES: Oxygen Saturation is 97% on RA, normal by my interpretation.    COORDINATION OF CARE: 3:39 AM Discussed treatment plan which includes CT head, laceration repair, and Tdap with pt at bedside and pt agreed to plan.  Labs Reviewed - No data to display  Imaging Review Ct Head Wo Contrast  10/01/2014   CLINICAL DATA:  Patient fell tonight at home. Struck head on wood floor. Laceration to the right side of the head. No loss of consciousness.  EXAM: CT HEAD WITHOUT CONTRAST  TECHNIQUE: Contiguous axial images were obtained from the base of the skull through the vertex without intravenous contrast.  COMPARISON:  None.  FINDINGS: Mild diffuse cerebral atrophy. No ventricular dilatation. Mild low-attenuation change in the deep white matter suggesting small vessel ischemic change. No mass effect or midline shift. No abnormal extra-axial fluid collections. Gray-white matter junctions are distinct. Basal cisterns are not effaced. No evidence of acute intracranial hemorrhage. No depressed skull fractures. Visualized paranasal sinuses and mastoid air cells are not opacified. Skin clips over the right posterior parietal region.  IMPRESSION:  No acute intracranial abnormalities. Mild chronic atrophy and small vessel ischemic changes.   Electronically Signed   By: Lucienne Capers M.D.   On: 10/01/2014 04:45      MDM   Final diagnoses:  Scalp laceration, initial encounter  Fall, initial encounter    Patient presents with a scalp laceration following a fall from standing height. Patient reports that she drank wine earlier this evening and feels that that is why she was dizzy when she stood up.  Aside from the laceration, patient is nontoxic in ABCs are intact. Given her age, will obtain CT scan to rule out intracranial bleed. Tetanus was updated. Laceration was repaired at the bedside by Delena Bali, PA.  CT scan is negative. A shunt is well-appearing and ambulatory. Discussed with patient drinking alcohol only in  moderation. Patient stated understanding.  After history, exam, and medical workup I feel the patient has been appropriately medically screened and is safe for discharge home. Pertinent diagnoses were discussed with the patient. Patient was given return precautions.  I personally performed the services described in this documentation, which was scribed in my presence. The recorded information has been reviewed and is accurate.   Monique Hacker, MD 10/01/14 [phone]

## 2014-10-01 NOTE — Discharge Instructions (Signed)
You were seen today for a fall resulting in a laceration to her scalp. You need to follow-up with her primary doctor in 10 days for staple removal. Your CT scan is reassuring and no evidence of blood on the brain. You should avoid excessive alcohol use.  Head Injury You have received a head injury. It does not appear serious at this time. Headaches and vomiting are common following head injury. It should be easy to awaken from sleeping. Sometimes it is necessary for you to stay in the emergency department for a while for observation. Sometimes admission to the hospital may be needed. After injuries such as yours, most problems occur within the first 24 hours, but side effects may occur up to 7-10 days after the injury. It is important for you to carefully monitor your condition and contact your health care provider or seek immediate medical care if there is a change in your condition. WHAT ARE THE TYPES OF HEAD INJURIES? Head injuries can be as minor as a bump. Some head injuries can be more severe. More severe head injuries include:  A jarring injury to the brain (concussion).  A bruise of the brain (contusion). This mean there is bleeding in the brain that can cause swelling.  A cracked skull (skull fracture).  Bleeding in the brain that collects, clots, and forms a bump (hematoma). WHAT CAUSES A HEAD INJURY? A serious head injury is most likely to happen to someone who is in a car wreck and is not wearing a seat belt. Other causes of major head injuries include bicycle or motorcycle accidents, sports injuries, and falls. HOW ARE HEAD INJURIES DIAGNOSED? A complete history of the event leading to the injury and your current symptoms will be helpful in diagnosing head injuries. Many times, pictures of the brain, such as CT or MRI are needed to see the extent of the injury. Often, an overnight hospital stay is necessary for observation.  WHEN SHOULD I SEEK IMMEDIATE MEDICAL CARE?  You should get  help right away if:  You have confusion or drowsiness.  You feel sick to your stomach (nauseous) or have continued, forceful vomiting.  You have dizziness or unsteadiness that is getting worse.  You have severe, continued headaches not relieved by medicine. Only take over-the-counter or prescription medicines for pain, fever, or discomfort as directed by your health care provider.  You do not have normal function of the arms or legs or are unable to walk.  You notice changes in the black spots in the center of the colored part of your eye (pupil).  You have a clear or bloody fluid coming from your nose or ears.  You have a loss of vision. During the next 24 hours after the injury, you must stay with someone who can watch you for the warning signs. This person should contact local emergency services (911 in the [place] if you have seizures, you become unconscious, or you are unable to wake up. HOW CAN I PREVENT A HEAD INJURY IN THE FUTURE? The most important factor for preventing major head injuries is avoiding motor vehicle accidents. To minimize the potential for damage to your head, it is crucial to wear seat belts while riding in motor vehicles. Wearing helmets while bike riding and playing collision sports (like football) is also helpful. Also, avoiding dangerous activities around the house will further help reduce your risk of head injury.  WHEN CAN I RETURN TO NORMAL ACTIVITIES AND ATHLETICS? You should be reevaluated by your  health care provider before returning to these activities. If you have any of the following symptoms, you should not return to activities or contact sports until 1 week after the symptoms have stopped:  Persistent headache.  Dizziness or vertigo.  Poor attention and concentration.  Confusion.  Memory problems.  Nausea or vomiting.  Fatigue or tire easily.  Irritability.  Intolerant of bright lights or loud noises.  Anxiety or  depression.  Disturbed sleep. MAKE SURE YOU:   Understand these instructions.  Will watch your condition.  Will get help right away if you are not doing well or get worse. Document Released: 01/07/2005 Document Revised: 01/12/2013 Document Reviewed: 09/14/2012 [facility] Patient Information 2015 [place], [place]. This information is not intended to replace advice given to you by your health care provider. Make sure you discuss any questions you have with your health care provider. Laceration Care, Adult A laceration is a cut or lesion that goes through all layers of the skin and into the tissue just beneath the skin. TREATMENT  Some lacerations may not require closure. Some lacerations may not be able to be closed due to an increased risk of infection. It is important to see your caregiver as soon as possible after an injury to minimize the risk of infection and maximize the opportunity for successful closure. If closure is appropriate, pain medicines may be given, if needed. The wound will be cleaned to help prevent infection. Your caregiver will use stitches (sutures), staples, wound glue (adhesive), or skin adhesive strips to repair the laceration. These tools bring the skin edges together to allow for faster healing and a better cosmetic outcome. However, all wounds will heal with a scar. Once the wound has healed, scarring can be minimized by covering the wound with sunscreen during the day for 1 full year. HOME CARE INSTRUCTIONS  For sutures or staples:  Keep the wound clean and dry.  If you were given a bandage (dressing), you should change it at least once a day. Also, change the dressing if it becomes wet or dirty, or as directed by your caregiver.  Wash the wound with soap and water 2 times a day. Rinse the wound off with water to remove all soap. Pat the wound dry with a clean towel.  After cleaning, apply a thin layer of the antibiotic ointment as recommended by your caregiver.  This will help prevent infection and keep the dressing from sticking.  You may shower as usual after the first 24 hours. Do not soak the wound in water until the sutures are removed.  Only take over-the-counter or prescription medicines for pain, discomfort, or fever as directed by your caregiver.  Get your sutures or staples removed as directed by your caregiver. For skin adhesive strips:  Keep the wound clean and dry.  Do not get the skin adhesive strips wet. You may bathe carefully, using caution to keep the wound dry.  If the wound gets wet, pat it dry with a clean towel.  Skin adhesive strips will fall off on their own. You may trim the strips as the wound heals. Do not remove skin adhesive strips that are still stuck to the wound. They will fall off in time. For wound adhesive:  You may briefly wet your wound in the shower or bath. Do not soak or scrub the wound. Do not swim. Avoid periods of heavy perspiration until the skin adhesive has fallen off on its own. After showering or bathing, gently pat the wound dry  with a clean towel.  Do not apply liquid medicine, cream medicine, or ointment medicine to your wound while the skin adhesive is in place. This may loosen the film before your wound is healed.  If a dressing is placed over the wound, be careful not to apply tape directly over the skin adhesive. This may cause the adhesive to be pulled off before the wound is healed.  Avoid prolonged exposure to sunlight or tanning lamps while the skin adhesive is in place. Exposure to ultraviolet light in the first year will darken the scar.  The skin adhesive will usually remain in place for 5 to 10 days, then naturally fall off the skin. Do not pick at the adhesive film. You may need a tetanus shot if:  You cannot remember when you had your last tetanus shot.  You have never had a tetanus shot. If you get a tetanus shot, your arm may swell, get red, and feel warm to the touch. This is  common and not a problem. If you need a tetanus shot and you choose not to have one, there is a rare chance of getting tetanus. Sickness from tetanus can be serious. SEEK MEDICAL CARE IF:   You have redness, swelling, or increasing pain in the wound.  You see a red line that goes away from the wound.  You have yellowish-white fluid (pus) coming from the wound.  You have a fever.  You notice a bad smell coming from the wound or dressing.  Your wound breaks open before or after sutures have been removed.  You notice something coming out of the wound such as wood or glass.  Your wound is on your hand or foot and you cannot move a finger or toe. SEEK IMMEDIATE MEDICAL CARE IF:   Your pain is not controlled with prescribed medicine.  You have severe swelling around the wound causing pain and numbness or a change in color in your arm, hand, leg, or foot.  Your wound splits open and starts bleeding.  You have worsening numbness, weakness, or loss of function of any joint around or beyond the wound.  You develop painful lumps near the wound or on the skin anywhere on your body. MAKE SURE YOU:   Understand these instructions.  Will watch your condition.  Will get help right away if you are not doing well or get worse. Document Released: 01/07/2005 Document Revised: 04/01/2011 Document Reviewed: 07/03/2010 [facility] Patient Information 2015 [place], [place]. This information is not intended to replace advice given to you by your health care provider. Make sure you discuss any questions you have with your health care provider.

## 2014-10-11 ENCOUNTER — Encounter: Payer: Self-pay | Admitting: Internal Medicine

## 2014-10-11 ENCOUNTER — Ambulatory Visit (INDEPENDENT_AMBULATORY_CARE_PROVIDER_SITE_OTHER): Payer: Medicare Other | Admitting: Internal Medicine

## 2014-10-11 VITALS — BP 126/72 | HR 64 | Temp 98.0°F | Ht 63.0 in | Wt 149.4 lb

## 2014-10-11 DIAGNOSIS — S0101XD Laceration without foreign body of scalp, subsequent encounter: Secondary | ICD-10-CM

## 2014-10-11 NOTE — Progress Notes (Signed)
Subjective:    Patient ID: Monique Ballard, female    DOB: 11/26/1940, 74 y.o.   MRN: [id number]  DOS:  10/11/2014 Type of visit - description : ER follow-up Interval history: Martin Majestic to the ER 10/01/2014 after a fall. Per chart review,she had "too much wine" and when she stood up she got dizzy and fell. Struck her head, developed a laceration which was repaired. No lost of consciousness, neck pain or other injuries. CT head was nonacute   Review of Systems Since he left the ER she is doing well, reports no fever, chills, nausea, vomiting, headache. No neck pain either.  Past Medical History  Diagnosis Date  . Breast cancer     Dx 1997, lumpectomy, XRT  . HTN (hypertension)   . Depression   . GERD (gastroesophageal reflux disease)     w/"stretching" remotely    Past Surgical History  Procedure Laterality Date  . Breast lumpectomy  1997  . Pelvic fracture surgery      endometriosis, remotely  . Cesarean section      x2    Social History   Social History  . Marital Status: Married    Spouse Name: N/A  . Number of Children: 2  . Years of Education: N/A   Occupational History  . retired    Social History Main Topics  . Smoking status: Former Research scientist (life sciences)  . Smokeless tobacco: Never Used     Comment: Quit >30 yrs ago  . Alcohol Use: Yes     Comment: 1 glass wine dialy  . Drug Use: No  . Sexual Activity: Not on file   Other Topics Concern  . 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  Always use your most recent med list.               acetaminophen 500 MG tablet  Commonly known as:  TYLENOL  Take 1 tablet (500 mg total) by mouth every 6 (six) hours as needed.     Biotin 10 MG Tabs  Take 1 tablet by mouth daily.     CENTRUM SILVER PO  Take 1 each by mouth daily.     citalopram 20 MG tablet  Commonly known as:  CELEXA  Take 1 tablet (20 mg total) by mouth daily.     Fish Oil 1200 MG Caps  Take 1 capsule by mouth daily.     glucosamine-chondroitin 500-400 MG tablet  Take 1 tablet by mouth 3 (three) times daily.     hydrochlorothiazide 25 MG tablet  Commonly known as:  HYDRODIURIL  Take 1 tablet (25 mg total) by mouth daily.     ibuprofen 200 MG tablet  Commonly known as:  ADVIL,MOTRIN  Take 400 mg by mouth every 4 (four) hours as needed for fever or moderate pain.     omeprazole 20 MG capsule  Commonly known as:  PRILOSEC  Take 20 mg by mouth every other day.     OSCAL 500/200 D-3 500-200 MG-UNIT per tablet  Generic drug:  calcium-vitamin D  Take 1 tablet by mouth daily.     potassium chloride SA 20 MEQ tablet  Commonly known as:  K-DUR,KLOR-CON  Take 1 tablet (20 mEq total) by mouth daily.           Objective:   Physical Exam  HENT:  Head:     BP 126/72 mmHg  Pulse 64  Temp(Src) 98 F (36.7 C) (Oral)  Ht 5\' 3"  (1.6 m)  Wt 149 lb 6 oz (67.756 kg)  BMI 26.47 kg/m2  SpO2 97% General:   Well developed, well nourished . NAD, well appearing.  HEENT:  Normocephalic . Face symmetric Neurologic:  alert & oriented X3.  Speech normal, gait appropriate for age and unassisted Psych--  Cognition and judgment appear intact.  Cooperative with normal attention span and concentration.  Behavior appropriate. No anxious or depressed appearing.      Assessment & Plan:   Laceration: Stitches were removed, local care to discuss with the patient. Will call if bleeding or evidence of infection. Primary care: we are out of high-dose shot, will provide one when is available (or get it at the pharmacy)

## 2014-10-11 NOTE — Progress Notes (Signed)
Pre visit review using our clinic review tool, if applicable. No additional management support is needed unless otherwise documented below in the visit note. 

## 2014-10-14 ENCOUNTER — Ambulatory Visit (INDEPENDENT_AMBULATORY_CARE_PROVIDER_SITE_OTHER): Payer: Medicare Other | Admitting: *Deleted

## 2014-10-14 DIAGNOSIS — Z23 Encounter for immunization: Secondary | ICD-10-CM | POA: Diagnosis not present

## 2014-10-14 NOTE — Progress Notes (Signed)
Pre visit review using our clinic review tool, if applicable. No additional management support is needed unless otherwise documented below in the visit note. 

## 2014-12-11 ENCOUNTER — Encounter: Payer: Self-pay | Admitting: Internal Medicine

## 2014-12-12 ENCOUNTER — Encounter: Payer: Self-pay | Admitting: Internal Medicine

## 2014-12-12 ENCOUNTER — Ambulatory Visit (INDEPENDENT_AMBULATORY_CARE_PROVIDER_SITE_OTHER): Payer: Medicare Other | Admitting: Internal Medicine

## 2014-12-12 VITALS — BP 126/80 | HR 91 | Temp 97.8°F | Ht 63.0 in | Wt 147.1 lb

## 2014-12-12 DIAGNOSIS — K219 Gastro-esophageal reflux disease without esophagitis: Secondary | ICD-10-CM

## 2014-12-12 DIAGNOSIS — M8580 Other specified disorders of bone density and structure, unspecified site: Secondary | ICD-10-CM

## 2014-12-12 DIAGNOSIS — F329 Major depressive disorder, single episode, unspecified: Secondary | ICD-10-CM

## 2014-12-12 DIAGNOSIS — I10 Essential (primary) hypertension: Secondary | ICD-10-CM | POA: Diagnosis not present

## 2014-12-12 DIAGNOSIS — R251 Tremor, unspecified: Secondary | ICD-10-CM

## 2014-12-12 DIAGNOSIS — Z0000 Encounter for general adult medical examination without abnormal findings: Secondary | ICD-10-CM | POA: Diagnosis not present

## 2014-12-12 DIAGNOSIS — Z09 Encounter for follow-up examination after completed treatment for conditions other than malignant neoplasm: Secondary | ICD-10-CM

## 2014-12-12 DIAGNOSIS — Z780 Asymptomatic menopausal state: Secondary | ICD-10-CM

## 2014-12-12 DIAGNOSIS — F32A Depression, unspecified: Secondary | ICD-10-CM

## 2014-12-12 MED ORDER — METOPROLOL TARTRATE 25 MG PO TABS: 25.0000 mg | ORAL_TABLET | Freq: Two times a day (BID) | ORAL | Status: DC

## 2014-12-12 MED ORDER — PANTOPRAZOLE SODIUM 40 MG PO TBEC: 40.0000 mg | DELAYED_RELEASE_TABLET | Freq: Every day | ORAL | Status: DC

## 2014-12-12 MED ORDER — CITALOPRAM HYDROBROMIDE 20 MG PO TABS: 40.0000 mg | ORAL_TABLET | Freq: Every day | ORAL | Status: DC

## 2014-12-12 NOTE — Progress Notes (Signed)
Pre visit review using our clinic review tool, if applicable. No additional management support is needed unless otherwise documented below in the visit note. 

## 2014-12-12 NOTE — Assessment & Plan Note (Addendum)
Td 8- 2010, pneumonia shot 2011, prevnar 2016 shingles shot 2011 Had a Flu shot  Colonoscopy:  11/22/2002, May 2015, negative, 10 years ([facility]) female cared used to see  Gynecology, no recent visits.No history of abnormal Pap tests. Likely she won't  need any further Pap smears Mammogram 02-2014 neg. consider start doing breast exams at this office.  continue with her healthy diet and exercise

## 2014-12-12 NOTE — Progress Notes (Addendum)
Subjective:    Patient ID: Monique Ballard, female    DOB: Sep 01, 1940, 74 y.o.   MRN: [id number]  DOS:  12/12/2014 Type of visit - description :  Here for Medicare AWV:  1. Risk factors based on Past M, S, F history: reviewed 2. Physical Activities:   3. Depression/mood:  +sx , lost a pet, having issues w/ her husband, on citalopram, no suicidal, see a/p 4. Hearing:  No problems noted or reported  5. ADL's: independent, drives  6. Fall Risk: had a fall, prevention discussed , see AVS. slt less steady than before, we discussed PT ( declined ); denies pain being an issue 7. home Safety: does feel safe at home  8. Height, weight, & visual acuity: see VS, sees eye doctor regulalrly 9. Counseling: provided 10. Labs ordered based on risk factors: if needed  11. Referral Coordination: if needed 12. Care Plan, see assessment and plan , written personalized plan provided , see AVS 13. Cognitive Assessment: motor skills and cognition appropriate for age 73. Care team updated  15. End-of-life care discussed  In addition, today we discussed the following: HTN: On hydrochlorothiazide, often times forgets about it. GERD: Asymptomatic as long as she takes PPIs New issue: Has developed tremor of her hands, worse when she tries to write, sometimes her handwriting is very difficult.   Review of Systems  Constitutional: No fever. No chills. No unexplained wt changes. No unusual sweats  HEENT: No dental problems, no ear discharge, no facial swelling, no voice changes. No eye discharge, no eye  redness , no  intolerance to light   Respiratory: No wheezing , no  difficulty breathing. No cough , no mucus production  Cardiovascular: No CP, no leg swelling , no  Palpitations  GI: no nausea, no vomiting, no diarrhea , no  abdominal pain.  No blood in the stools. No dysphagia, no odynophagia    Endocrine: No polyphagia, no polyuria , no polydipsia  GU: No dysuria, gross hematuria, difficulty  urinating. No urinary urgency, no frequency.  Musculoskeletal: No joint swellings or unusual aches or pains  Skin: No change in the color of the skin, palor , no  Rash  Allergic, immunologic: No environmental allergies , no  food allergies  Neurological: No dizziness no  syncope. No headaches. No diplopia, no slurred, no slurred speech, no motor deficits, no facial  Numbness  Hematological: No enlarged lymph nodes, no easy bruising , no unusual bleedings  Psychiatry: No suicidal ideas, no hallucinations, no beavior problems, no confusion.    Past Medical History  Diagnosis Date  . Breast cancer ([place])     Dx 1997, lumpectomy, XRT  . HTN (hypertension)   . Depression   . GERD (gastroesophageal reflux disease)     w/"stretching" remotely    Past Surgical History  Procedure Laterality Date  . Breast lumpectomy  1997  . Pelvic fracture surgery      endometriosis, remotely  . Cesarean section      x2    Social History   Social History  . Marital Status: Married    Spouse Name: N/A  . Number of Children: 2  . Years of Education: N/A   Occupational History  . retired    Social History Main Topics  . Smoking status: Former Research scientist (life sciences)  . Smokeless tobacco: Never Used     Comment: Quit >30 yrs ago  . Alcohol Use: Yes     Comment: 1 glass wine dialy  . Drug  Use: No  . Sexual Activity: Not on file   Other Topics Concern  . 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 Hypertension Mother   . Stroke Mother   . Heart attack Father   . Heart disease Father     CHF  . Breast cancer Neg Hx   . Colon cancer Neg Hx   . Diabetes Neg Hx   . Throat cancer Other        Medication List       This list is accurate as of: 12/12/14 11:59 PM.  Always use your most recent med list.                 Biotin 10 MG Tabs  Take 1 tablet by mouth daily.     CENTRUM SILVER PO  Take 1 each by mouth daily.     citalopram 20 MG tablet  Commonly known as:  CELEXA  Take 2 tablets (40 mg total) by mouth daily.     Fish Oil 1200 MG Caps  Take 1 capsule by mouth daily.     glucosamine-chondroitin 500-400 MG tablet  Take 1 tablet by mouth 3 (three) times daily.     hydrochlorothiazide 25 MG tablet  Commonly known as:  HYDRODIURIL  Take 1 tablet (25 mg total) by mouth daily.     metoprolol tartrate 25 MG tablet  Commonly known as:  LOPRESSOR  Take 1 tablet (25 mg total) by mouth 2 (two) times daily.     OSCAL 500/200 D-3 500-200 MG-UNIT tablet  Generic drug:  calcium-vitamin D  Take 1 tablet by mouth daily.     pantoprazole 40 MG tablet  Commonly known as:  PROTONIX  Take 1 tablet (40 mg total) by mouth daily.     potassium chloride SA 20 MEQ tablet  Commonly known as:  K-DUR,KLOR-CON  Take 1 tablet (20 mEq total) by mouth daily.           Objective:   Physical Exam BP 126/80 mmHg  Pulse 91  Temp(Src) 97.8 F (36.6 C) (Oral)  Ht 5\' 3"  (1.6 m)  Wt 147 lb 2 oz (66.735 kg)  BMI 26.07 kg/m2  SpO2 96% General:   Well developed, well nourished . NAD.  HEENT:  Normocephalic . Face symmetric, atraumatic. Neck: No thyromegaly Lungs:  CTA B Normal respiratory effort, no intercostal retractions, no accessory muscle use. Heart: RRR,  no murmur.  no pretibial edema bilaterally  Abdomen:  Not distended, soft, non-tender. No rebound or rigidity. No bruit  Skin: Not pale. Not jaundice Neurologic:  alert & oriented X3.  Speech normal, gait appropriate for age and unassisted. + Fine tremor in the hands, symmetric, increase with intention Psych--  Cognition and judgment appear intact.  Cooperative with normal attention span and concentration.  Behavior appropriate. No anxious or depressed appearing.    Assessment & Plan:   Assessment> HTN Depression Tremor dx  11-2014 GERD with esophageal stricture Breast cancer, 1997, lumpectomy, XRT, released from oncology  Plan: HTN: Currently on diuretics, does not take them daily. Will change to metoprolol twice a day. DC potassium supplements Tremor: New problem, likely essential tremor, will start metoprolol to treat BP and help  with tremors. Reassess in 4 months Depression: Slightly worse, recommend to think about counseling, we agreed to increase citalopram to 40 mg. See instructions GERD: Controlled on PPIs, no dysphagia at this point. Will check a B12 and vitamin D Osteopenia: T score -1.2 (2013), on calcium and vitamin D, check a bone density test. RTC 4 months

## 2014-12-12 NOTE — Patient Instructions (Signed)
Get your blood work before you leave   Stop  omeprazole, take pantoprazole  Stop hydrochlorothiazide Start metoprolol twice a day  Citalopram 20 mg: Take 1.5 tablets every day for 2 weeks, then 2 tablets every day.  Check the  blood pressure 2 or 3 times a   Week   Be sure your blood pressure is between 110/65 and  145/85.  if it is consistently higher or lower, let me know   Next visit  for a routine checkup in 4 months      (15 minutes) Please schedule an appointment at the front desk   Fall Prevention and [place] cause injuries and can affect all age groups. It is possible to use preventive measures to significantly decrease the likelihood of falls. There are many simple measures which can make your home safer and prevent falls. OUTDOORS  Repair cracks and edges of walkways and driveways.  Remove high doorway thresholds.  Trim shrubbery on the main path into your home.  Have good outside lighting.  Clear walkways of tools, rocks, debris, and clutter.  Check that handrails are not broken and are securely fastened. Both sides of steps should have handrails.  Have leaves, snow, and ice cleared regularly.  Use sand or salt on walkways during winter months.  In the garage, clean up grease or oil spills. BATHROOM  Install night lights.  Install grab bars by the toilet and in the tub and shower.  Use non-skid mats or decals in the tub or shower.  Place a plastic non-slip stool in the shower to sit on, if needed.  Keep floors dry and clean up all water on the floor immediately.  Remove soap buildup in the tub or shower on a regular basis.  Secure bath mats with non-slip, double-sided rug tape.  Remove throw rugs and tripping hazards from the floors. BEDROOMS  Install night lights.  Make sure a bedside light is easy to reach.  Do not use oversized bedding.  Keep a telephone by your bedside.  Have a firm chair with side arms to use for getting  dressed.  Remove throw rugs and tripping hazards from the floor. KITCHEN  Keep handles on pots and pans turned toward the center of the stove. Use back burners when possible.  Clean up spills quickly and allow time for drying.  Avoid walking on wet floors.  Avoid hot utensils and knives.  Position shelves so they are not too high or low.  Place commonly used objects within easy reach.  If necessary, use a sturdy step stool with a grab bar when reaching.  Keep electrical cables out of the way.  Do not use floor polish or wax that makes floors slippery. If you must use wax, use non-skid floor wax.  Remove throw rugs and tripping hazards from the floor. STAIRWAYS  Never leave objects on stairs.  Place handrails on both sides of stairways and use them. Fix any loose handrails. Make sure handrails on both sides of the stairways are as long as the stairs.  Check carpeting to make sure it is firmly attached along stairs. Make repairs to worn or loose carpet promptly.  Avoid placing throw rugs at the top or bottom of stairways, or properly secure the rug with carpet tape to prevent slippage. Get rid of throw rugs, if possible.  Have an electrician put in a light switch at the top and bottom of the stairs. OTHER FALL PREVENTION TIPS  Wear low-heel or rubber-soled  shoes that are supportive and fit well. Wear closed toe shoes.  When using a stepladder, make sure it is fully opened and both spreaders are firmly locked. Do not climb a closed stepladder.  Add color or contrast paint or tape to grab bars and handrails in your home. Place contrasting color strips on first and last steps.  Learn and use mobility aids as needed. Install an electrical emergency response system.  Turn on lights to avoid dark areas. Replace light bulbs that burn out immediately. Get light switches that glow.  Arrange furniture to create clear pathways. Keep furniture in the same place.  Firmly attach  carpet with non-skid or double-sided tape.  Eliminate uneven floor surfaces.  Select a carpet pattern that does not visually hide the edge of steps.  Be aware of all pets. OTHER HOME SAFETY TIPS  Set the water temperature for 120 F (48.8 C).  Keep emergency numbers on or near the telephone.  Keep smoke detectors on every level of the home and near sleeping areas. Document Released: 12/28/2001 Document Revised: 07/09/2011 Document Reviewed: 03/29/2011 [facility] Patient Information 2015 [place], [place]. This information is not intended to replace advice given to you by your health care provider. Make sure you discuss any questions you have with your health care provider.   Preventive Care for Adults Ages 20 and over  Blood pressure check.** / Every 1 to 2 years.  Lipid and cholesterol check.**/ Every 5 years beginning at age 75.  Lung cancer screening. / Every year if you are aged 27-80 years and have a 30-pack-year history of smoking and currently smoke or have quit within the past 15 years. Yearly screening is stopped once you have quit smoking for at least 15 years or develop a health problem that would prevent you from having lung cancer treatment.  Fecal occult blood test (FOBT) of stool. / Every year beginning at age 62 and continuing until age 39. You may not have to do this test if you get a colonoscopy every 10 years.  Flexible sigmoidoscopy** or colonoscopy.** / Every 5 years for a flexible sigmoidoscopy or every 10 years for a colonoscopy beginning at age 11 and continuing until age 74.  Hepatitis C blood test.** / For all people born from [birth year] through 1963 and any individual with known risks for hepatitis C.  Abdominal aortic aneurysm (AAA) screening.** / A one-time screening for ages 2 to 27 years who are current or former smokers.  Skin self-exam. / Monthly.  Influenza vaccine. / Every year.  Tetanus, diphtheria, and acellular pertussis (Tdap/Td) vaccine.** / 1  dose of Td every 10 years.  Varicella vaccine.** / Consult your health care provider.  Zoster vaccine.** / 1 dose for adults aged 69 years or older.  Pneumococcal 13-valent conjugate (PCV13) vaccine.** / Consult your health care provider.  Pneumococcal polysaccharide (PPSV23) vaccine.** / 1 dose for all adults aged 17 years and older.  Meningococcal vaccine.** / Consult your health care provider.  Hepatitis A vaccine.** / Consult your health care provider.  Hepatitis B vaccine.** / Consult your health care provider.  Haemophilus influenzae type b (Hib) vaccine.** / Consult your health care provider. **Family history and personal history of risk and conditions may change your health care provider's recommendations. Document Released: 03/05/2001 Document Revised: 01/12/2013 Document Reviewed: 06/04/2010 [facility] Patient Information 2015 [place], [place]. This information is not intended to replace advice given to you by your health care provider. Make sure you discuss any questions you have with your  health care provider.

## 2014-12-13 DIAGNOSIS — Z09 Encounter for follow-up examination after completed treatment for conditions other than malignant neoplasm: Secondary | ICD-10-CM | POA: Insufficient documentation

## 2014-12-13 LAB — LIPID PANEL
CHOL/HDL RATIO: 2
Cholesterol: 177 mg/dL (ref 0–200)
HDL: 85.3 mg/dL (ref >39.00)
LDL Cholesterol: 74 mg/dL (ref 0–99)
NONHDL: 91.24
Triglycerides: 87 mg/dL (ref 0.0–149.0)
VLDL: 17.4 mg/dL (ref 0.0–40.0)

## 2014-12-13 LAB — VITAMIN B12: VITAMIN B 12: 531 pg/mL (ref 211–911)

## 2014-12-13 LAB — VITAMIN D 25 HYDROXY (VIT D DEFICIENCY, FRACTURES): VITD: 54.53 ng/mL (ref 30.00–100.00)

## 2014-12-13 LAB — TSH: TSH: 0.59 u[IU]/mL (ref 0.35–4.50)

## 2014-12-13 NOTE — Assessment & Plan Note (Signed)
HTN: Currently on diuretics, does not take them daily. Will change to metoprolol twice a day. DC potassium supplements Tremor: New problem, likely essential tremor, will start metoprolol to treat BP and help with tremors. Reassess in 4 months Depression: Slightly worse, recommend to think about counseling, we agreed to increase citalopram to 40 mg. See instructions GERD: Controlled on PPIs, no dysphagia at this point. Will check a B12 and vitamin D Osteopenia: T score -1.2 (2013), on calcium and vitamin D, check a bone density test. RTC 4 months

## 2014-12-14 ENCOUNTER — Other Ambulatory Visit: Payer: Self-pay | Admitting: Internal Medicine

## 2014-12-14 NOTE — Addendum Note (Signed)
Addended by: Kathlene November E on: 12/14/2014 11:56 AM   Modules accepted: Miquel Dunn

## 2014-12-15 ENCOUNTER — Encounter: Payer: Self-pay | Admitting: Internal Medicine

## 2015-02-20 ENCOUNTER — Encounter: Payer: Self-pay | Admitting: Internal Medicine

## 2015-02-23 ENCOUNTER — Telehealth: Payer: Self-pay

## 2015-02-23 NOTE — Telephone Encounter (Signed)
-----   Message -----    From: Wyn Quaker    Sent: 02/23/2015  1:58 PM EST      To: Kathlene November, MD Subject: Non-Urgent Medical Question  My wife, Danajha Wiehe (Susie) Valere Dross [phone]) has an appt. tomorrow with Dr. Larose Kells at [facility]. I want to share my observations & concerns: 1) she sleeps late into the morning & goes back to bed some times 2) has fairly consistent diarrhea 3) poor appetite 4) her hands shake fairly often 5) unstable going up & down stairs 6) has always loved her crotchet, book club & bible study meetings but has skipped some lately & forced herself to go as well Hope this info. helps with your appt. with her. Regards, Lanae Crumbly

## 2015-02-23 NOTE — Telephone Encounter (Signed)
Received message below from Pt's husband Raejean Coda via his MyChart. Message copied and pasted into correct chart. Informed Pt's husband to make sure he is correctly logged into the right MyChart account and/or to call us next time to prevent messages going into the wrong chart. Also, informed him I was sending this message to Dr. Larose Kells.

## 2015-02-23 NOTE — Telephone Encounter (Signed)
thank you, will discuss at time of OV

## 2015-02-24 ENCOUNTER — Encounter: Payer: Self-pay | Admitting: Internal Medicine

## 2015-02-24 ENCOUNTER — Ambulatory Visit (INDEPENDENT_AMBULATORY_CARE_PROVIDER_SITE_OTHER): Payer: Medicare Other | Admitting: Internal Medicine

## 2015-02-24 VITALS — BP 142/88 | HR 77 | Temp 98.4°F | Ht 63.0 in | Wt 151.1 lb

## 2015-02-24 DIAGNOSIS — F419 Anxiety disorder, unspecified: Principal | ICD-10-CM

## 2015-02-24 DIAGNOSIS — I10 Essential (primary) hypertension: Secondary | ICD-10-CM

## 2015-02-24 DIAGNOSIS — F418 Other specified anxiety disorders: Secondary | ICD-10-CM | POA: Diagnosis not present

## 2015-02-24 DIAGNOSIS — F329 Major depressive disorder, single episode, unspecified: Secondary | ICD-10-CM

## 2015-02-24 DIAGNOSIS — F32A Depression, unspecified: Secondary | ICD-10-CM

## 2015-02-24 DIAGNOSIS — R251 Tremor, unspecified: Secondary | ICD-10-CM | POA: Diagnosis not present

## 2015-02-24 DIAGNOSIS — R269 Unspecified abnormalities of gait and mobility: Secondary | ICD-10-CM

## 2015-02-24 MED ORDER — METOPROLOL TARTRATE 50 MG PO TABS: 50.0000 mg | ORAL_TABLET | Freq: Two times a day (BID) | ORAL | Status: DC

## 2015-02-24 MED ORDER — BUPROPION HCL 100 MG PO TABS: 100.0000 mg | ORAL_TABLET | Freq: Two times a day (BID) | ORAL | Status: DC

## 2015-02-24 NOTE — Progress Notes (Signed)
Subjective:    Patient ID: Monique Ballard, female    DOB: 04-30-1940, 75 y.o.   MRN: [id number]  DOS:  02/24/2015 Type of visit - description : Acute visit Interval history: Chief complaint today is depression, since the last time I saw her has not improved, continue to be feeling sad, down, anxious. Some insomnia. Tremors: Now on beta blockers, slightly better ?. Gait is unsteady and hesitant particularly when she goes upstairs, no falls. Also had noted decreased appetite and often times  has postprandial diarrhea. Has not lost weight. Interestingly, her hot flashes have returned HTN: Ambulatory BPs sometimes in the 160s.    Wt Readings from Last 3 Encounters:  02/24/15 151 lb 2 oz (68.55 kg)  12/12/14 147 lb 2 oz (66.735 kg)  10/11/14 149 lb 6 oz (67.756 kg)     Review of Systems Denies nausea, vomiting. No blood in the stools or stomach pain. Denies any neck pain or lack of lower extremity coordination. No recent stroke symptoms such as diplopia, slurred speech, motor deficits.   Past Medical History  Diagnosis Date  . Breast cancer ([place])     Dx 1997, lumpectomy, XRT  . HTN (hypertension)   . Depression   . GERD (gastroesophageal reflux disease)     w/"stretching" remotely    Past Surgical History  Procedure Laterality Date  . Breast lumpectomy  1997  . Pelvic fracture surgery      endometriosis, remotely  . Cesarean section      x2    Social History   Social History  . Marital Status: Married    Spouse Name: N/A  . Number of Children: 2  . Years of Education: N/A   Occupational History  . retired    Social History Main Topics  . Smoking status: Former Research scientist (life sciences)  . Smokeless tobacco: Never Used     Comment: Quit >30 yrs ago  . Alcohol Use: Yes     Comment: 1 glass wine dialy  . Drug Use: No  . Sexual Activity: Not on file   Other Topics Concern  . 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  Always use your most recent med list.               Biotin 10 MG Tabs  Take 1 tablet by mouth daily.     buPROPion 100 MG tablet  Commonly known as:  WELLBUTRIN  Take 1 tablet (100 mg total) by mouth 2 (two) times daily.     CENTRUM SILVER PO  Take 1 each by mouth daily.     citalopram 20 MG tablet  Commonly known as:  CELEXA  Take 2 tablets (40 mg total) by mouth daily.     Fish Oil 1200 MG Caps  Take 1 capsule by mouth daily.     glucosamine-chondroitin 500-400 MG tablet  Take 1 tablet by mouth 3 (three) times daily.     metoprolol 50 MG tablet  Commonly known as:  LOPRESSOR  Take 1 tablet (50 mg total) by mouth 2 (two) times daily.     OSCAL 500/200 D-3 500-200 MG-UNIT tablet  Generic drug:  calcium-vitamin D  Take 1 tablet by mouth daily.     pantoprazole 40 MG tablet  Commonly known as:  PROTONIX  Take 1 tablet (40 mg total) by mouth daily.           Objective:   Physical Exam BP 142/88 mmHg  Pulse 77  Temp(Src) 98.4 F (36.9 C) (Oral)  Ht 5\' 3"  (1.6 m)  Wt 151 lb 2 oz (68.55 kg)  BMI 26.78 kg/m2  SpO2 98% General:   Well developed, well nourished . NAD.  HEENT:  Normocephalic . Face symmetric, atraumatic Skin: Not pale. Not jaundice Neurologic:  alert & oriented X3.  Speech normal, gait appropriate for age and unassisted. Motor and DTRs symmetric Mild tremors, hands. Good coordination Psych--  Cognition and judgment appear intact.  Cooperative with normal attention span and concentration.  Behavior appropriate. No anxious or depressed appearing.      Assessment & Plan:   Assessment> HTN Depression Tremor dx 11-2014 GERD with esophageal stricture Breast cancer, 1997, lumpectomy, XRT, released from oncology  PLAN: See recent phone note, husband reported a number of symptoms likely  related to depression. See below  HTN: Ambulatory BPs in the 116 sometimes, increased dose of metoprolol. Tremors: Improved on beta blockers Depression : still an issue, PHQ-9 scored 15 (moderate),  currently on citalopram 40 mg, in the past Wellbutrin helped. Plan: Restart Wellbutrin. Melatonin OTC, consider sleep medicine. Reassess in 2 months. Recommend counseling Unsteady gait: unsteady going upstairs, no recent stroke symptoms, I think  PT will help regain her confidence and prevent falls. She is interested, likes to discuss first with her daughter who is a physical therapist. Will call if a referral is neded. Hot-flashes: sx have resurface. Recommend to discuss with gynecology Decreased appetite, frequent diarrhea: She is up-to-date on colonoscopies, most recent labs show no anemia or thyroid disease. Will treat depression and reassess in 2 months, if not better will need further evaluation. RTC 2 months

## 2015-02-24 NOTE — Patient Instructions (Addendum)
GO TO THE FRONT DESK   Schedule a routine office visit or check up to be done in  2 months  No fasting   Front desk:   30   Wellbutrin 100 mg: Take 1 tablet a day for 2 weeks, then increase to one tablet twice a day  Please think about see one of our counselors   Increase metoprolol to 50 mg: 1 tablet twice a day   Check the  blood pressure 2 or 3 times a   Week Be sure your blood pressure is between 110/65 and  145/85. If it is consistently higher or lower, let me know Also be sure your heart rate does not drop below 55 consistently.

## 2015-02-24 NOTE — Progress Notes (Signed)
Pre visit review using our clinic review tool, if applicable. No additional management support is needed unless otherwise documented below in the visit note. 

## 2015-02-27 ENCOUNTER — Other Ambulatory Visit: Payer: Self-pay

## 2015-02-27 DIAGNOSIS — Z1231 Encounter for screening mammogram for malignant neoplasm of breast: Secondary | ICD-10-CM

## 2015-02-27 DIAGNOSIS — Z853 Personal history of malignant neoplasm of breast: Secondary | ICD-10-CM

## 2015-03-16 ENCOUNTER — Encounter: Payer: Self-pay | Admitting: Internal Medicine

## 2015-03-16 NOTE — Telephone Encounter (Signed)
Called pt to scheduled. No answer. lvm to call our office or schedule appt via My Chart.

## 2015-03-23 ENCOUNTER — Ambulatory Visit
Admission: RE | Admit: 2015-03-23 | Discharge: 2015-03-23 | Disposition: A | Discharge status: Home / Self Care | Payer: Medicare Other | Source: Ambulatory Visit

## 2015-03-23 DIAGNOSIS — Z1231 Encounter for screening mammogram for malignant neoplasm of breast: Secondary | ICD-10-CM

## 2015-03-23 DIAGNOSIS — Z853 Personal history of malignant neoplasm of breast: Secondary | ICD-10-CM

## 2015-04-03 ENCOUNTER — Ambulatory Visit (INDEPENDENT_AMBULATORY_CARE_PROVIDER_SITE_OTHER): Payer: Medicare Other | Admitting: Family Medicine

## 2015-04-03 ENCOUNTER — Ambulatory Visit (HOSPITAL_BASED_OUTPATIENT_CLINIC_OR_DEPARTMENT_OTHER)
Admission: RE | Admit: 2015-04-03 | Discharge: 2015-04-03 | Disposition: A | Discharge status: Home / Self Care | Payer: Medicare Other | Source: Ambulatory Visit | Attending: Family Medicine | Admitting: Family Medicine

## 2015-04-03 ENCOUNTER — Encounter: Payer: Self-pay | Admitting: Family Medicine

## 2015-04-03 VITALS — BP 130/90 | HR 96 | Temp 97.4°F | Ht 63.0 in | Wt 147.6 lb

## 2015-04-03 DIAGNOSIS — R059 Cough, unspecified: Secondary | ICD-10-CM

## 2015-04-03 DIAGNOSIS — R062 Wheezing: Secondary | ICD-10-CM | POA: Insufficient documentation

## 2015-04-03 DIAGNOSIS — J208 Acute bronchitis due to other specified organisms: Secondary | ICD-10-CM

## 2015-04-03 DIAGNOSIS — R5381 Other malaise: Secondary | ICD-10-CM | POA: Diagnosis not present

## 2015-04-03 DIAGNOSIS — R05 Cough: Secondary | ICD-10-CM | POA: Insufficient documentation

## 2015-04-03 IMAGING — CR DG CHEST 2V
2 series · 2 of 2 positions shown · non-contrast
Comparison: [DATE]

CLINICAL DATA: Cough and congestion 5 days.

EXAM:
CHEST  2 VIEW

[w chest pa]
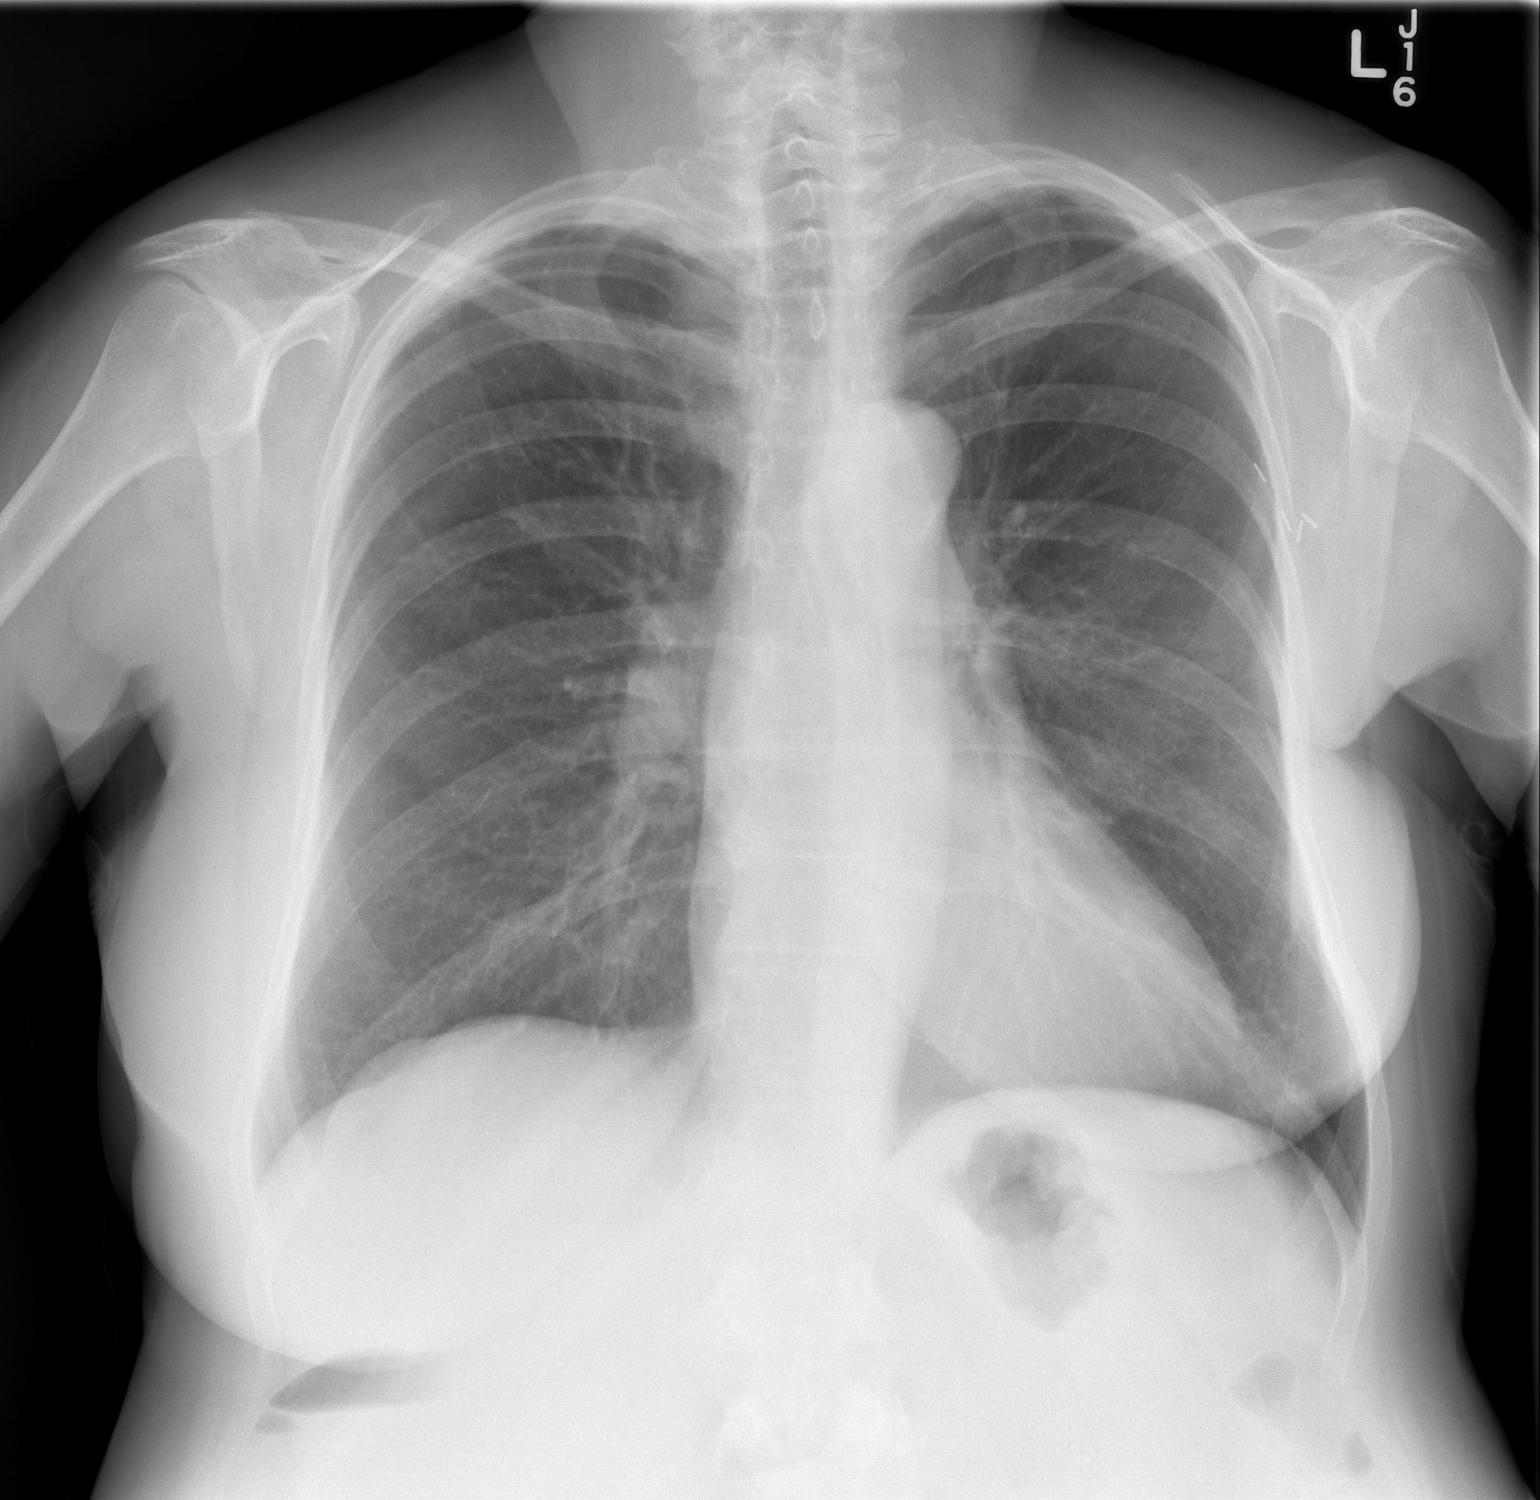

[w chest lat]
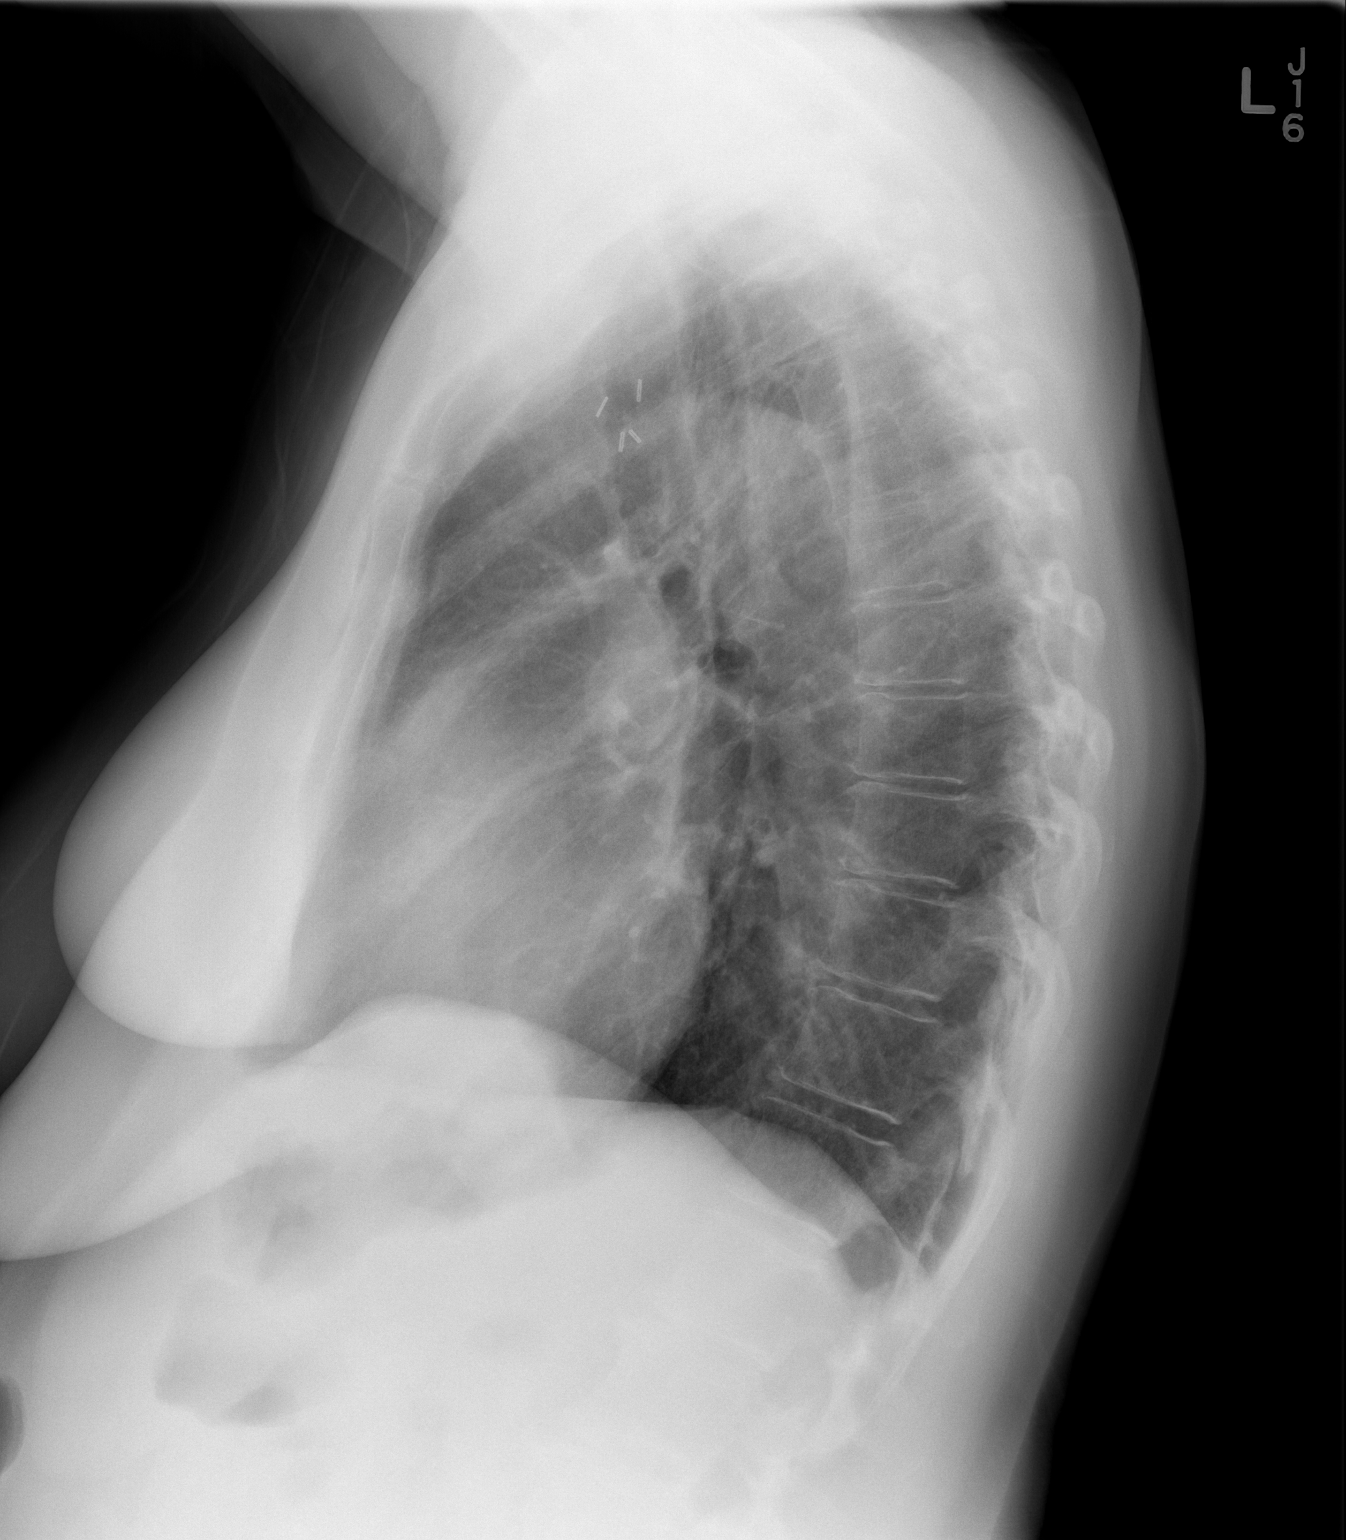

[2 of 2 positions shown; findings below may reference images not displayed]

FINDINGS: Lungs are adequately inflated without consolidation or effusion.
Cardiomediastinal silhouette is within normal. There are surgical
clips over the left axilla. Evidence of previous left lumpectomy.
Remainder of the exam is within normal.
IMPRESSION: No active cardiopulmonary disease.

## 2015-04-03 MED ORDER — ALBUTEROL SULFATE HFA 108 (90 BASE) MCG/ACT IN AERS: 2.0000 | INHALATION_SPRAY | Freq: Four times a day (QID) | RESPIRATORY_TRACT | Status: DC | PRN

## 2015-04-03 MED ORDER — ALBUTEROL SULFATE (2.5 MG/3ML) 0.083% IN NEBU: 2.5000 mg | INHALATION_SOLUTION | Freq: Once | RESPIRATORY_TRACT | Status: DC

## 2015-04-03 MED ORDER — DOXYCYCLINE HYCLATE 100 MG PO CAPS: 100.0000 mg | ORAL_CAPSULE | Freq: Two times a day (BID) | ORAL | Status: DC

## 2015-04-03 MED ORDER — PREDNISONE 20 MG PO TABS: ORAL_TABLET | ORAL | Status: DC

## 2015-04-03 MED FILL — predniSONE 20 MG TABS: 20 | 9 days supply | Qty: 9 | Fill #0

## 2015-04-03 MED FILL — DOXYCYCLINE HYC 100 MG CAP: 100 | 10 days supply | Qty: 20 | Fill #0

## 2015-04-03 MED FILL — VENTOLIN HFA 90 MCG INHALER: 108 (90 BAS | 30 days supply | Qty: 18 | Fill #0

## 2015-04-03 NOTE — Progress Notes (Signed)
[place] at [facility] [address], [place], [place] [address] [phone]  Date:  04/03/2015   Name:  Monique Ballard   DOB:  December 21, 1940   MRN:  [id number]  PCP:  Kathlene November, MD    Chief Complaint: Cough   History of Present Illness:  Monique Ballard is a 75 y.o. very pleasant female patient who presents with the following:  Here today for a sick visit-  She has noted sx of a cold for 5 days. She first noted a ST, and then developed mild SOB, cough "that is real deep in my chest but I'm not getting anything up from it." She notes poor appetite  She has not had any fever  No vomiting but she has had diarrhea  She did smoke many years ago but quit She has noted some wheezing- she does not have asthma. She does not use any inhaler at home regularly No CP, no history of CV disease  Her husband was also ill but he is now better  She has tried some OTC medications   Patient Active Problem List   Diagnosis Date Noted  . Tremor 02/24/2015  . Gait disorder 02/24/2015  . PCP NOTES >>>>>>> 12/13/2014  . Abdominal wall strain 03/19/2012  . Osteopenia 10/04/2011  . Annual physical exam 09/07/2010  . KNEE PAIN 03/05/2010  . GERD 08/28/2009  . RHINITIS 02/13/2007  . Anxiety and depression 10/06/2006  . Essential hypertension 10/06/2006  . BREAST CANCER, HX OF 10/06/2006    Past Medical History  Diagnosis Date  . Breast cancer ([place])     Dx 1997, lumpectomy, XRT  . HTN (hypertension)   . Depression   . GERD (gastroesophageal reflux disease)     w/"stretching" remotely    Past Surgical History  Procedure Laterality Date  . Breast lumpectomy  1997  . Pelvic fracture surgery      endometriosis, remotely  . Cesarean section      x2    Social History  Substance Use Topics  . Smoking status: Former Research scientist (life sciences)  . Smokeless tobacco: Never Used     Comment: Quit >30 yrs ago  . Alcohol Use: Yes     Comment: 1 glass wine dialy     Family History  Problem Relation Age of Onset  . Hypertension Mother   . Stroke Mother   . Heart attack Father   . Heart disease Father     CHF  . Breast cancer Neg Hx   . Colon cancer Neg Hx   . Diabetes Neg Hx   . Throat cancer Other     Allergies  Allergen Reactions  . Penicillins Rash    Has patient had a PCN reaction causing immediate rash, facial/tongue/throat swelling, SOB or lightheadedness with hypotension: Yes Has patient had a PCN reaction causing severe rash involving mucus membranes or skin necrosis: No Has patient had a PCN reaction that required hospitalization No Has patient had a PCN reaction occurring within the last 10 years: No If all of the above answers are "NO", then may proceed with Cephalosporin use.     Medication list has been reviewed and updated.  Current Outpatient Prescriptions on File Prior to Visit  Medication Sig Dispense Refill  . Biotin 10 MG TABS Take 1 tablet by mouth daily.    Marland Kitchen buPROPion (WELLBUTRIN) 100 MG tablet Take 1 tablet (100 mg total) by mouth 2 (two) times daily. 60 tablet  3  . calcium-vitamin D (OSCAL 500/200 D-3) 500-200 MG-UNIT per tablet Take 1 tablet by mouth daily.      . citalopram (CELEXA) 20 MG tablet Take 2 tablets (40 mg total) by mouth daily. 60 tablet 6  . glucosamine-chondroitin 500-400 MG tablet Take 1 tablet by mouth 3 (three) times daily.    . metoprolol (LOPRESSOR) 50 MG tablet Take 1 tablet (50 mg total) by mouth 2 (two) times daily. 60 tablet 6  . Multiple Vitamins-Minerals (CENTRUM SILVER PO) Take 1 each by mouth daily.      . Omega-3 Fatty Acids (FISH OIL) 1200 MG CAPS Take 1 capsule by mouth daily.     . pantoprazole (PROTONIX) 40 MG tablet Take 1 tablet (40 mg total) by mouth daily. 30 tablet 12   No current facility-administered medications on file prior to visit.    Review of Systems:  As per HPI- otherwise negative.   Physical Examination: Filed Vitals:   04/03/15 1451  BP: 150/86   Pulse: 101  Temp: 97.4 F (36.3 C)   Filed Vitals:   04/03/15 1451  Height: 5\' 3"  (1.6 m)  Weight: 147 lb 9.6 oz (66.951 kg)   Body mass index is 26.15 kg/(m^2). Ideal Body Weight: Weight in (lb) to have BMI = 25: 140.8  GEN: WDWN, NAD, Non-toxic, A & O x 3, looks well but is coughing some HEENT: Atraumatic, Normocephalic. Neck supple. No masses, No LAD.  Bilateral TM wnl, oropharynx normal.  PEERL,EOMI.   Ears and Nose: No external deformity. CV: RRR, No M/G/R. No JVD. No thrill. No extra heart sounds. PULM: no crackles, no rhonchi. No retractions. No resp. distress. No accessory muscle use.  Mild wheezing bilaterally ABD: S, NT, ND EXTR: No c/c/e NEURO Normal gait.  PSYCH: Normally interactive. Conversant. Not depressed or anxious appearing.  Calm demeanor.   Albuterol neb: did not change her sx much but her lungs sounded better  SpO2 Readings from Last 3 Encounters:  04/03/15 95%  02/24/15 98%  12/12/14 96%   Dg Chest 2 View  04/03/2015  CLINICAL DATA:  Cough and congestion 5 days. EXAM: CHEST  2 VIEW COMPARISON:  02/27/2012 FINDINGS: Lungs are adequately inflated without consolidation or effusion. Cardiomediastinal silhouette is within normal. There are surgical clips over the left axilla. Evidence of previous left lumpectomy. Remainder of the exam is within normal. IMPRESSION: No active cardiopulmonary disease. Electronically Signed   By: Marin Olp M.D.   On: 04/03/2015 15:32   Mm Screening Breast Tomo Bilateral  03/27/2015  CLINICAL DATA:  Screening. Left lumpectomy with radiation therapy 1997. EXAM: DIGITAL SCREENING BILATERAL MAMMOGRAM WITH 3D TOMO WITH CAD COMPARISON:  Previous exam(s). ACR Breast Density Category b: There are scattered areas of fibroglandular density. FINDINGS: There are no findings suspicious for malignancy. Postoperative changes are seen in the left breast. Images were processed with CAD. IMPRESSION: No mammographic evidence of malignancy. A result  letter of this screening mammogram will be mailed directly to the patient. RECOMMENDATION: Screening mammogram in one year. (Code:SM-B-01Y) BI-RADS CATEGORY  1: Negative. Electronically Signed   By: Nolon Nations M.D.   On: 03/27/2015 13:07     Assessment and Plan: Cough - Plan: albuterol (PROVENTIL) (2.5 MG/3ML) 0.083% nebulizer solution 2.5 mg, DG Chest 2 View  Malaise - Plan: DG Chest 2 View  Wheezing - Plan: albuterol (PROVENTIL) (2.5 MG/3ML) 0.083% nebulizer solution 2.5 mg, DG Chest 2 View, albuterol (PROVENTIL HFA;VENTOLIN HFA) 108 (90 Base) MCG/ACT inhaler, predniSONE (DELTASONE) 20  MG tablet  Acute bronchitis due to other specified organisms - Plan: doxycycline (VIBRAMYCIN) 100 MG capsule, albuterol (PROVENTIL HFA;VENTOLIN HFA) 108 (90 Base) MCG/ACT inhaler, predniSONE (DELTASONE) 20 MG tablet  Here today with cough and wheezing. No chest pain or evidence of CHF on her CXR Will treat for bronchitis with doxy, albuterol and prednisone She will seek care if not better in the next 2 days- Sooner if worse.     Signed Lamar Blinks, MD

## 2015-04-03 NOTE — Patient Instructions (Addendum)
Please go downstairs for your chest x-ray and then come back up- I will review the film and we will make our plan  We are going to treat you for bronchitis with  Doxycycline (antibiotic) prednisone (for your lungs and wheezing) Albuterol (for wheezing and cough)  Please let me know if you are not feeling better in the next 2 days- Sooner if worse.

## 2015-04-05 ENCOUNTER — Telehealth: Payer: Self-pay | Admitting: Internal Medicine

## 2015-04-05 NOTE — Telephone Encounter (Signed)
Advised patient to try eating a light snack then a few minutes later take the prednisone.  She is also going to continue taking her over the counter cough medicine.  She will try this and call if it doesn't help.

## 2015-04-05 NOTE — Telephone Encounter (Signed)
Relation to pt: self Call back [phone] Pharmacy: [facility] [place] [phone] (Phone) [phone] (Fax)         Reason for call:  Patient last seen 04/03/2015 by Dr. Lorelei Pont and was advised to call if symptoms didn't improve. Patient states she has no appetite, vomiting because she's forcing herself to eat because the predisone requires you to eat while taking medication and cough has not improved.

## 2015-04-11 ENCOUNTER — Encounter: Payer: Self-pay | Admitting: Internal Medicine

## 2015-04-11 ENCOUNTER — Ambulatory Visit (INDEPENDENT_AMBULATORY_CARE_PROVIDER_SITE_OTHER): Payer: Medicare Other | Admitting: Internal Medicine

## 2015-04-11 VITALS — BP 106/64 | HR 81 | Temp 98.8°F | Ht 63.0 in | Wt 148.8 lb

## 2015-04-11 DIAGNOSIS — F419 Anxiety disorder, unspecified: Secondary | ICD-10-CM

## 2015-04-11 DIAGNOSIS — R269 Unspecified abnormalities of gait and mobility: Secondary | ICD-10-CM

## 2015-04-11 DIAGNOSIS — J208 Acute bronchitis due to other specified organisms: Secondary | ICD-10-CM | POA: Diagnosis not present

## 2015-04-11 DIAGNOSIS — F329 Major depressive disorder, single episode, unspecified: Secondary | ICD-10-CM

## 2015-04-11 DIAGNOSIS — F418 Other specified anxiety disorders: Secondary | ICD-10-CM | POA: Diagnosis not present

## 2015-04-11 DIAGNOSIS — Z09 Encounter for follow-up examination after completed treatment for conditions other than malignant neoplasm: Secondary | ICD-10-CM

## 2015-04-11 NOTE — Progress Notes (Signed)
Subjective:    Patient ID: Monique Ballard, female    DOB: 02-14-1940, 75 y.o.   MRN: [id number]  DOS:  04/11/2015 Type of visit - description : Follow-up previous visit Interval history:  Depression: Started Wellbutrin, is feeling much better. Gait disturbance: Did not pursue physical therapy however she is doing better. Was recently seen with A respiratory infection, prescribed prednisone and antibiotic. Chest x-ray was negative. Feeling better.   Review of Systems Appetite continued to be slightly decreased. Denies postprandial nausea or abdominal pain. No weight loss per her own scales, in our scales she lost 3 pounds. No dysphagia Denies any nausea, vomiting, diarrhea.  Wt Readings from Last 3 Encounters:  04/11/15 148 lb 12.8 oz (67.495 kg)  04/03/15 147 lb 9.6 oz (66.951 kg)  02/24/15 151 lb 2 oz (68.55 kg)    Past Medical History  Diagnosis Date  . Breast cancer ([place])     Dx 1997, lumpectomy, XRT  . HTN (hypertension)   . Depression   . GERD (gastroesophageal reflux disease)     w/"stretching" remotely    Past Surgical History  Procedure Laterality Date  . Breast lumpectomy  1997  . Pelvic fracture surgery      endometriosis, remotely  . Cesarean section      x2    Social History   Social History  . Marital Status: Married    Spouse Name: N/A  . Number of Children: 2  . Years of Education: N/A   Occupational History  . retired    Social History Main Topics  . Smoking status: Former Research scientist (life sciences)  . Smokeless tobacco: Never Used     Comment: Quit >30 yrs ago  . Alcohol Use: Yes     Comment: 1 glass wine dialy  . Drug Use: No  . Sexual Activity: Not on file   Other Topics Concern  . Not on file   Social History Narrative   Lives w/ husband   1 Older Brother   Occupation: retired 2011 from [employer]   2 children, 2 [place]                  Medication List       This list is accurate as of: 04/11/15  8:45 PM.  Always use your most  recent med list.               albuterol 108 (90 Base) MCG/ACT inhaler  Commonly known as:  PROVENTIL HFA;VENTOLIN HFA  Inhale 2 puffs into the lungs every 6 (six) hours as needed for wheezing or shortness of breath.     Biotin 10 MG Tabs  Take 1 tablet by mouth daily.     buPROPion 100 MG tablet  Commonly known as:  WELLBUTRIN  Take 1 tablet (100 mg total) by mouth 2 (two) times daily.     CENTRUM SILVER PO  Take 1 each by mouth daily.     citalopram 20 MG tablet  Commonly known as:  CELEXA  Take 2 tablets (40 mg total) by mouth daily.     doxycycline 100 MG capsule  Commonly known as:  VIBRAMYCIN  Take 1 capsule (100 mg total) by mouth 2 (two) times daily.     Fish Oil 1200 MG Caps  Take 1 capsule by mouth daily.     glucosamine-chondroitin 500-400 MG tablet  Take 1 tablet by mouth 3 (three) times daily.     metoprolol 50 MG tablet  Commonly known  as:  LOPRESSOR  Take 1 tablet (50 mg total) by mouth 2 (two) times daily.     OSCAL 500/200 D-3 500-200 MG-UNIT tablet  Generic drug:  calcium-vitamin D  Take 1 tablet by mouth daily.     pantoprazole 40 MG tablet  Commonly known as:  PROTONIX  Take 1 tablet (40 mg total) by mouth daily.           Objective:   Physical Exam BP 106/64 mmHg  Pulse 81  Temp(Src) 98.8 F (37.1 C) (Oral)  Ht 5\' 3"  (1.6 m)  Wt 148 lb 12.8 oz (67.495 kg)  BMI 26.37 kg/m2  SpO2 96% General:   Well developed, well nourished . NAD.  HEENT:  Normocephalic . Face symmetric, atraumatic Lungs:  CTA B Normal respiratory effort, no intercostal retractions, no accessory muscle use. Heart: RRR,  no murmur.  No pretibial edema bilaterally  Skin: Not pale. Not jaundice Neurologic:  alert & oriented X3.  Speech normal, gait appropriate for age and unassisted Psych--  Cognition and judgment appear intact.  Cooperative with normal attention span and concentration.  Behavior appropriate. No anxious or depressed appearing. Seems in good  spirits today     Assessment & Plan:   Assessment> HTN Depression, Anxiety Tremor dx 11-2014 GERD with esophageal stricture Breast cancer, 1997, lumpectomy, XRT, released from oncology  PLAN: Depression: Currently on citalopram and Wellbutrin (added few weeks ago) Sx well-controlled, declined adjustment of her medications as she is doing well. Plans to see one of our counselors Unsteady gait: Did not pursue PT, currently doing better. Decreased appetite: Still having slightly decreased appetite without GI symptoms, did lost 3 pounds in the last few weeks. Monitor sx on RTC Diarrhea resolved.   Bronchitis, bronchospasm: seen here 04-03-15,X-ray negative, improving, rec to  finish antibiotics, continue albuterol as needed. RTC 4-5 months.

## 2015-04-11 NOTE — Progress Notes (Signed)
Pre visit review using our clinic review tool, if applicable. No additional management support is needed unless otherwise documented below in the visit note. 

## 2015-04-11 NOTE — Assessment & Plan Note (Signed)
Depression: Currently on citalopram and Wellbutrin (added few weeks ago) Sx well-controlled, declined adjustment of her medications as she is doing well. Plans to see one of our counselors Unsteady gait: Did not pursue PT, currently doing better. Decreased appetite: Still having slightly decreased appetite without GI symptoms, did lost 3 pounds in the last few weeks. Monitor sx on RTC Diarrhea resolved.   Bronchitis, bronchospasm: seen here 04-03-15,X-ray negative, improving, rec to  finish antibiotics, continue albuterol as needed. RTC 4-5 months.

## 2015-04-11 NOTE — Patient Instructions (Addendum)
GO TO THE FRONT DESK Schedule your next appointment for a  Routine check up When?   4-5 months  Fasting?  No  Continue same meds  See Karna Christmas

## 2015-04-14 ENCOUNTER — Telehealth: Payer: Self-pay | Admitting: Internal Medicine

## 2015-04-14 NOTE — Telephone Encounter (Signed)
Please advise 

## 2015-04-14 NOTE — Telephone Encounter (Signed)
Spoke with the patient, at the last visit she said that she was doing better but today states that shewas not. She actually thinks is worse since we rx  Wellbutrin. Denies any suicidal ideas, simply very sad  Every day. We agreed to decrease Wellbutrin to one tablet daily for 2 weeks then stop She will come back in 3-4 weeks for further eval, will consider switch citalopram to possibly Cymbalta. In the meantime she already has an appointment to see a counselor

## 2015-04-14 NOTE — Telephone Encounter (Signed)
Caller name: Self  Can be reached: [phone]   Reason for call: Patient states that she feels she may need a change in her depression medication. Pls adv

## 2015-04-23 ENCOUNTER — Encounter: Payer: Self-pay | Admitting: Internal Medicine

## 2015-04-26 ENCOUNTER — Encounter: Payer: Self-pay | Admitting: Internal Medicine

## 2015-04-28 ENCOUNTER — Ambulatory Visit (INDEPENDENT_AMBULATORY_CARE_PROVIDER_SITE_OTHER): Payer: Medicare Other | Admitting: Internal Medicine

## 2015-04-28 ENCOUNTER — Encounter: Payer: Self-pay | Admitting: Internal Medicine

## 2015-04-28 VITALS — BP 142/82 | HR 73 | Temp 98.1°F | Ht 63.0 in | Wt 147.0 lb

## 2015-04-28 DIAGNOSIS — F419 Anxiety disorder, unspecified: Secondary | ICD-10-CM

## 2015-04-28 DIAGNOSIS — F32A Depression, unspecified: Secondary | ICD-10-CM

## 2015-04-28 DIAGNOSIS — F418 Other specified anxiety disorders: Secondary | ICD-10-CM

## 2015-04-28 DIAGNOSIS — R627 Adult failure to thrive: Secondary | ICD-10-CM

## 2015-04-28 DIAGNOSIS — R5383 Other fatigue: Secondary | ICD-10-CM | POA: Diagnosis not present

## 2015-04-28 DIAGNOSIS — M8580 Other specified disorders of bone density and structure, unspecified site: Secondary | ICD-10-CM | POA: Diagnosis not present

## 2015-04-28 DIAGNOSIS — F329 Major depressive disorder, single episode, unspecified: Secondary | ICD-10-CM

## 2015-04-28 DIAGNOSIS — G4700 Insomnia, unspecified: Secondary | ICD-10-CM

## 2015-04-28 LAB — TSH: TSH: 0.75 m[IU]/L

## 2015-04-28 LAB — T4, FREE: FREE T4: 1.1 ng/dL (ref 0.8–1.8)

## 2015-04-28 LAB — T3, FREE: T3 FREE: 3.1 pg/mL (ref 2.3–4.2)

## 2015-04-28 MED ORDER — DULOXETINE HCL 30 MG PO CPEP: 60.0000 mg | ORAL_CAPSULE | Freq: Every day | ORAL | Status: DC

## 2015-04-28 MED ORDER — ALPRAZOLAM 0.5 MG PO TABS: 0.5000 mg | ORAL_TABLET | Freq: Every evening | ORAL | Status: DC | PRN

## 2015-04-28 NOTE — Progress Notes (Signed)
Subjective:    Patient ID: Monique Ballard, female    DOB: 05-31-1940, 75 y.o.   MRN: [id number]  DOS:  04/28/2015 Type of visit - description : Acute visit Interval history: Chart reviewed, she was seen 04/11/2015, she was doing well however she reported on 04/14/2015 that actually depression was not well-controlled, getting worse with Wellbutrin. We agreed to continue citalopram and  wean off wellbutrin w/ a f/u in 4 weeks.  She presents today for evaluation of above and multiple others symptoms: Had nausea and vomiting for few days when she was taking an antibiotic but now is better Still feeling anxious Has increased number of bowel movements without diarrhea, thinks related to a thyroid problem She has tremors, on metoprolol, still shakes sometimes She continued having difficulty sleeping, despite taking melatonin. She  also takes 2 glasses of wine at night to be able to go to sleep. She reports she is very tired, on further questioning she admits to snoring and feeling sleepy throughout the day    Wt Readings from Last 3 Encounters:  04/28/15 147 lb (66.679 kg)  04/11/15 148 lb 12.8 oz (67.495 kg)  04/03/15 147 lb 9.6 oz (66.951 kg)    Review of Systems Denies fever chills. No blood in the stools No cough No suicidal ideas   Past Medical History  Diagnosis Date  . Breast cancer ([place])     Dx 1997, lumpectomy, XRT  . HTN (hypertension)   . Depression   . GERD (gastroesophageal reflux disease)     w/"stretching" remotely    Past Surgical History  Procedure Laterality Date  . Breast lumpectomy  1997  . Pelvic fracture surgery      endometriosis, remotely  . Cesarean section      x2    Social History   Social History  . Marital Status: Married    Spouse Name: N/A  . Number of Children: 2  . Years of Education: N/A   Occupational History  . retired    Social History Main Topics  . Smoking status: Former Research scientist (life sciences)  . Smokeless tobacco: Never Used   Comment: Quit >30 yrs ago  . Alcohol Use: Yes     Comment: 1 glass wine dialy  . Drug Use: No  . Sexual Activity: Not on file   Other Topics Concern  . 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  Always use your most recent med list.               albuterol 108 (90 Base) MCG/ACT inhaler  Commonly known as:  PROVENTIL HFA;VENTOLIN HFA  Inhale 2 puffs into the lungs every 6 (six) hours as needed for wheezing or shortness of breath.     ALPRAZolam 0.5 MG tablet  Commonly known as:  XANAX  Take 1 tablet (0.5 mg total) by mouth at bedtime as needed for anxiety or sleep.     Biotin 10 MG Tabs  Take 1 tablet by mouth daily. Reported on 04/28/2015     CENTRUM SILVER PO  Take 1 each by mouth daily.     DULoxetine 30 MG capsule  Commonly known as:  CYMBALTA  Take 2 capsules (60 mg total) by mouth daily.     Fish Oil 1200 MG Caps  Take 1 capsule by mouth daily.     glucosamine-chondroitin 500-400 MG tablet  Take 1 tablet by mouth 3 (three) times daily. Reported on 04/28/2015     metoprolol 50 MG tablet  Commonly known as:  LOPRESSOR  Take 1 tablet (50 mg total) by mouth 2 (two) times daily.     OSCAL 500/200 D-3 500-200 MG-UNIT tablet  Generic drug:  calcium-vitamin D  Take 1 tablet by mouth daily.     pantoprazole 40 MG tablet  Commonly known as:  PROTONIX  Take 1 tablet (40 mg total) by mouth daily.           Objective:   Physical Exam BP 142/82 mmHg  Pulse 73  Temp(Src) 98.1 F (36.7 C) (Oral)  Ht 5\' 3"  (1.6 m)  Wt 147 lb (66.679 kg)  BMI 26.05 kg/m2  SpO2 96% General:   Well developed, well nourished . NAD.  HEENT:  Normocephalic . Face symmetric, atraumatic Skin: Not pale. Not jaundice Neurologic:  alert & oriented X3.  Speech normal, gait  appropriate for age and unassisted Psych--  Cognition and judgment appear intact.  Cooperative with normal attention span and concentration.  Behavior appropriate. No anxious or depressed appearing.      Assessment & Plan:   Assessment> HTN Depression, Anxiety Tremor dx 11-2014 GERD with esophageal stricture Breast cancer, 1997, lumpectomy, XRT, released from oncology  PLAN: Depression anxiety: sx were well-controlled until 11-2014, that month she lost her dog, also due to the political environment she is having problems with her family. The addition of Wellbutrin didn't help, already has wean off bupropion. Plan: Discontinue citalopram, start Cymbalta, see instructions. Has an appointment to see a counselor Insomnia: Recommend Xanax at nighttime. Avoid or moderate wine Fatigue: She is concerned about her thyroid and request further eval. Will check a TSH, free T3, free 4, vitamin D. Her PHQ 9 is 17, moderate --> if no better consider eval for OSA (+snoring) Multiple symptoms, will reassess once depression and anxiety is better RTC 1 month  Today, I spent more than  25  min with the patient: >50% of the time counseling regards her multiple sx, reviewing the chart and discussing plan of care

## 2015-04-28 NOTE — Progress Notes (Signed)
Pre visit review using our clinic review tool, if applicable. No additional management support is needed unless otherwise documented below in the visit note. 

## 2015-04-28 NOTE — Patient Instructions (Signed)
GO TO THE LAB :      Get the blood work     [place] schedule your next appointment for a  checkup in one month, no fasting.  For insomnia  Xanax 0.5 mg one tablet at bedtime as needed. Melatonin is also okay   Depression: Stop citalopram Start Cymbalta 30 mg: 1 tablet every morning for one week, then take 2 tablets every morning.

## 2015-04-29 LAB — VITAMIN D 25 HYDROXY (VIT D DEFICIENCY, FRACTURES): VIT D 25 HYDROXY: 40 ng/mL (ref 30–100)

## 2015-04-29 NOTE — Assessment & Plan Note (Signed)
Depression anxiety: sx were well-controlled until 11-2014, that month she lost her dog, also due to the political environment she is having problems with her family. The addition of Wellbutrin didn't help, already has wean off bupropion. Plan: Discontinue citalopram, start Cymbalta, see instructions. Has an appointment to see a counselor Insomnia: Recommend Xanax at nighttime. Avoid or moderate wine Fatigue: She is concerned about her thyroid and request further eval. Will check a TSH, free T3, free 4, vitamin D. Her PHQ 9 is 17, moderate --> if no better consider eval for OSA (+snoring) Multiple symptoms, will reassess once depression and anxiety is better RTC 1 month

## 2015-05-08 ENCOUNTER — Ambulatory Visit (INDEPENDENT_AMBULATORY_CARE_PROVIDER_SITE_OTHER): Payer: Medicare Other | Admitting: Psychology

## 2015-05-08 DIAGNOSIS — F4323 Adjustment disorder with mixed anxiety and depressed mood: Secondary | ICD-10-CM | POA: Diagnosis not present

## 2015-05-12 ENCOUNTER — Encounter: Payer: Self-pay | Admitting: Internal Medicine

## 2015-05-15 ENCOUNTER — Ambulatory Visit: Payer: Medicare Other | Admitting: Internal Medicine

## 2015-05-31 ENCOUNTER — Ambulatory Visit (INDEPENDENT_AMBULATORY_CARE_PROVIDER_SITE_OTHER): Payer: Medicare Other | Admitting: Psychology

## 2015-05-31 DIAGNOSIS — F4323 Adjustment disorder with mixed anxiety and depressed mood: Secondary | ICD-10-CM

## 2015-06-01 ENCOUNTER — Encounter: Payer: Self-pay | Admitting: Internal Medicine

## 2015-06-01 ENCOUNTER — Ambulatory Visit (INDEPENDENT_AMBULATORY_CARE_PROVIDER_SITE_OTHER): Payer: Medicare Other | Admitting: Internal Medicine

## 2015-06-01 VITALS — BP 124/76 | HR 86 | Temp 98.4°F | Ht 63.0 in | Wt 153.5 lb

## 2015-06-01 DIAGNOSIS — R5383 Other fatigue: Secondary | ICD-10-CM | POA: Diagnosis not present

## 2015-06-01 DIAGNOSIS — F419 Anxiety disorder, unspecified: Principal | ICD-10-CM

## 2015-06-01 DIAGNOSIS — G4700 Insomnia, unspecified: Secondary | ICD-10-CM | POA: Diagnosis not present

## 2015-06-01 DIAGNOSIS — F32A Depression, unspecified: Secondary | ICD-10-CM

## 2015-06-01 DIAGNOSIS — F418 Other specified anxiety disorders: Secondary | ICD-10-CM

## 2015-06-01 DIAGNOSIS — F329 Major depressive disorder, single episode, unspecified: Secondary | ICD-10-CM

## 2015-06-01 MED ORDER — ALPRAZOLAM 0.5 MG PO TABS: 0.5000 mg | ORAL_TABLET | Freq: Every evening | ORAL | Status: DC | PRN

## 2015-06-01 NOTE — Assessment & Plan Note (Signed)
Anxiety depression: Significantly improved with Cymbalta, saw the counselor twice and plans to see Coralyn Mark again. No change, RF med prn Insomnia: Much improved with Xanax, refill provided Fatigue: Improve, TFTs and vitamin D were normal Tremor: Interestingly, she started BB for tremor 11-2014 but she got better only after she started Cymbalta consequently tremor were probably anxiety related. HTN: Currently well controlled on metoprolol, no change Multiple other symptoms (see last OV)--- resolved w/ anxiety treatment RTC 3-4 months

## 2015-06-01 NOTE — Patient Instructions (Addendum)
GO TO THE FRONT DESK Schedule your next appointment for a  Check up in 3-4 months , no fasting

## 2015-06-01 NOTE — Progress Notes (Signed)
Pre visit review using our clinic review tool, if applicable. No additional management support is needed unless otherwise documented below in the visit note. 

## 2015-06-01 NOTE — Progress Notes (Signed)
Subjective:    Patient ID: Monique Ballard, female    DOB: [birth year], 75 y.o.   MRN: [id number]  DOS:  06/01/2015 Type of visit - description : Follow-up Interval history: Since the last office visit, she switch to Cymbalta, doing significantly better. For insomnia is taking Xanax and is having no problems.  Review of Systems Denies nausea, vomiting, diarrhea Appetite is very good Tremors have subsided No suicidal ideas Wine intake has significantly decreased.  Past Medical History  Diagnosis Date  . Breast cancer ([place])     Dx 1997, lumpectomy, XRT  . HTN (hypertension)   . Depression   . GERD (gastroesophageal reflux disease)     w/"stretching" remotely    Past Surgical History  Procedure Laterality Date  . Breast lumpectomy  1997  . Pelvic fracture surgery      endometriosis, remotely  . Cesarean section      x2    Social History   Social History  . Marital Status: Married    Spouse Name: N/A  . Number of Children: 2  . Years of Education: N/A   Occupational History  . retired    Social History Main Topics  . Smoking status: Former Research scientist (life sciences)  . Smokeless tobacco: Never Used     Comment: Quit >30 yrs ago  . Alcohol Use: Yes     Comment: 1 glass wine dialy  . Drug Use: No  . Sexual Activity: Not on file   Other Topics Concern  . Not on file   Social History Narrative   Lives w/ husband   1 Older Brother   Occupation: retired 2011 from [place] office   2 children, 2 [place]                  Medication List       This list is accurate as of: 06/01/15  4:27 PM.  Always use your most recent med list.               albuterol 108 (90 Base) MCG/ACT inhaler  Commonly known as:  PROVENTIL HFA;VENTOLIN HFA  Inhale 2 puffs into the lungs every 6 (six) hours as needed for wheezing or shortness of breath.     ALPRAZolam 0.5 MG tablet  Commonly known as:  XANAX  Take 1 tablet (0.5 mg total) by mouth at bedtime as needed for anxiety or sleep.       Biotin 10 MG Tabs  Take 1 tablet by mouth daily. Reported on 06/01/2015     CENTRUM SILVER PO  Take 1 each by mouth daily.     DULoxetine 30 MG capsule  Commonly known as:  CYMBALTA  Take 2 capsules (60 mg total) by mouth daily.     Fish Oil 1200 MG Caps  Take 1 capsule by mouth daily.     glucosamine-chondroitin 500-400 MG tablet  Take 1 tablet by mouth 3 (three) times daily. Reported on 06/01/2015     metoprolol 50 MG tablet  Commonly known as:  LOPRESSOR  Take 1 tablet (50 mg total) by mouth 2 (two) times daily.     OSCAL 500/200 D-3 500-200 MG-UNIT tablet  Generic drug:  calcium-vitamin D  Take 1 tablet by mouth daily.     pantoprazole 40 MG tablet  Commonly known as:  PROTONIX  Take 1 tablet (40 mg total) by mouth daily.           Objective:   Physical Exam BP 124/76 mmHg  Pulse 86  Temp(Src) 98.4 F (36.9 C) (Oral)  Ht 5\' 3"  (1.6 m)  Wt 153 lb 8 oz (69.627 kg)  BMI 27.20 kg/m2  SpO2 96% General:   Well developed, well nourished . NAD.  HEENT:  Normocephalic . Face symmetric, atraumatic Neurologic:  alert & oriented X3.  Speech normal, gait appropriate for age and unassisted Psych--  Cognition and judgment appear intact.  Cooperative with normal attention span and concentration.  Behavior appropriate. No anxious or depressed appearing.  \    Assessment & Plan:   Assessment> HTN Depression, Anxiety Tremor dx 11-2014 GERD with esophageal stricture Breast cancer, 1997, lumpectomy, XRT, released from oncology  PLAN: Anxiety depression: Significantly improved with Cymbalta, saw the counselor twice and plans to see Coralyn Mark again. No change, RF med prn Insomnia: Much improved with Xanax, refill provided Fatigue: Improve, TFTs and vitamin D were normal Tremor: Interestingly, she started BB for tremor 11-2014 but she got better only after she started Cymbalta consequently tremor were probably anxiety related. HTN: Currently well controlled on  metoprolol, no change Multiple other symptoms (see last OV)--- resolved w/ anxiety treatment RTC 3-4 months

## 2015-06-26 ENCOUNTER — Other Ambulatory Visit: Payer: Self-pay | Admitting: Internal Medicine

## 2015-06-30 ENCOUNTER — Ambulatory Visit (INDEPENDENT_AMBULATORY_CARE_PROVIDER_SITE_OTHER): Payer: Medicare Other | Admitting: Psychology

## 2015-06-30 DIAGNOSIS — F4323 Adjustment disorder with mixed anxiety and depressed mood: Secondary | ICD-10-CM

## 2015-07-26 ENCOUNTER — Encounter: Payer: Self-pay | Admitting: Internal Medicine

## 2015-07-26 ENCOUNTER — Ambulatory Visit (INDEPENDENT_AMBULATORY_CARE_PROVIDER_SITE_OTHER): Payer: Medicare Other | Admitting: Psychology

## 2015-07-26 DIAGNOSIS — F4323 Adjustment disorder with mixed anxiety and depressed mood: Secondary | ICD-10-CM | POA: Diagnosis not present

## 2015-07-27 NOTE — Telephone Encounter (Signed)
Forwarding to [facility] in Dr. Ethel Rana absence. Pt has been informed that PCP is out of office until Monday, July 31, 2015 and medication may not be adjusted until then.

## 2015-07-28 ENCOUNTER — Telehealth: Payer: Self-pay | Admitting: Internal Medicine

## 2015-07-31 MED ORDER — DULOXETINE HCL 30 MG PO CPEP: 90.0000 mg | ORAL_CAPSULE | Freq: Every day | ORAL | Status: DC

## 2015-07-31 NOTE — Telephone Encounter (Signed)
Send Rx Cymbalta 30 mg 3 tablets daily #90, one refill  Received: Today    Colon Branch, MD  Damita Dunnings, CMA   Rx sent to [facility].

## 2015-08-08 NOTE — Telephone Encounter (Signed)
Error

## 2015-08-11 ENCOUNTER — Ambulatory Visit: Payer: Medicare Other | Admitting: Internal Medicine

## 2015-08-30 ENCOUNTER — Ambulatory Visit (INDEPENDENT_AMBULATORY_CARE_PROVIDER_SITE_OTHER): Payer: Medicare Other | Admitting: Psychology

## 2015-08-30 DIAGNOSIS — F4323 Adjustment disorder with mixed anxiety and depressed mood: Secondary | ICD-10-CM | POA: Diagnosis not present

## 2015-09-20 ENCOUNTER — Ambulatory Visit (INDEPENDENT_AMBULATORY_CARE_PROVIDER_SITE_OTHER): Payer: Medicare Other | Admitting: Internal Medicine

## 2015-09-20 ENCOUNTER — Encounter: Payer: Self-pay | Admitting: Internal Medicine

## 2015-09-20 VITALS — BP 118/74 | HR 105 | Temp 98.5°F | Resp 14 | Ht 63.0 in | Wt 155.1 lb

## 2015-09-20 DIAGNOSIS — F418 Other specified anxiety disorders: Secondary | ICD-10-CM | POA: Diagnosis not present

## 2015-09-20 DIAGNOSIS — Z09 Encounter for follow-up examination after completed treatment for conditions other than malignant neoplasm: Secondary | ICD-10-CM | POA: Diagnosis not present

## 2015-09-20 DIAGNOSIS — K625 Hemorrhage of anus and rectum: Secondary | ICD-10-CM | POA: Diagnosis not present

## 2015-09-20 DIAGNOSIS — R05 Cough: Secondary | ICD-10-CM

## 2015-09-20 DIAGNOSIS — F329 Major depressive disorder, single episode, unspecified: Secondary | ICD-10-CM

## 2015-09-20 DIAGNOSIS — Z79899 Other long term (current) drug therapy: Secondary | ICD-10-CM | POA: Diagnosis not present

## 2015-09-20 DIAGNOSIS — R059 Cough, unspecified: Secondary | ICD-10-CM

## 2015-09-20 DIAGNOSIS — R5383 Other fatigue: Secondary | ICD-10-CM | POA: Diagnosis not present

## 2015-09-20 DIAGNOSIS — F419 Anxiety disorder, unspecified: Secondary | ICD-10-CM

## 2015-09-20 MED ORDER — HYDROCORTISONE ACETATE 25 MG RE SUPP: 25.0000 mg | Freq: Two times a day (BID) | RECTAL | 1 refills | Status: DC | PRN

## 2015-09-20 NOTE — Assessment & Plan Note (Signed)
Depression anxiety: Controlled, continue Cymbalta Insomnia: Continue Xanax as needed, contract and UDS today. Fatigue: Still there but improved. Red Blood per rectum: new problem. Benign features, negative colonoscopy 2015, + hemorrhoids on exam which  is the likely etiology. Recommend Anusol, observation. If she gets worse she definitely needs to let me know. Cough: Improving, recommend Mucinex and albuterol as needed. RTC CPX 11-2015

## 2015-09-20 NOTE — Patient Instructions (Signed)
GO TO THE LAB : Iodine urine sample for a UDS  GO TO THE FRONT DESK Schedule your next appointment for a   physical exam, fasting, 11-2015    Cough: Mucinex DM, albuterol as needed, call if no better  Rectal bleeding:  Uses suppositories twice a day for a couple of days then as needed. Call if severe recurrence or persistent symptoms.

## 2015-09-20 NOTE — Progress Notes (Signed)
Subjective:    Patient ID: Monique Ballard, female    DOB: 08/02/1940, 75 y.o.   MRN: [id number]  DOS:  09/20/2015 Type of visit - description : rov Interval history: Came back from a seven-day trip to [place] on 09/12/2015, came back without cough, chest congestion. She is getting better. Yesterday and today saw "a lot" of red fresh blood in the toilet after a BM. Did not have any rectal pain, itching. No recent constipation. No previous episodes like this one. Depression: Good compliance with Cymbalta, symptoms controlled.   Review of Systems  Denies fever chills No sinus pain, congestion or nasal discharge No nausea, vomiting, diarrhea. No abdominal pain. Some chest rattling, no sputum production. Still has some fatigue  Past Medical History:  Diagnosis Date  . Breast cancer ([place])    Dx 1997, lumpectomy, XRT  . Depression   . GERD (gastroesophageal reflux disease)    w/"stretching" remotely  . HTN (hypertension)     Past Surgical History:  Procedure Laterality Date  . BREAST LUMPECTOMY  1997  . CESAREAN SECTION     x2  . PELVIC FRACTURE SURGERY     endometriosis, remotely    Social History   Social History  . Marital status: Married    Spouse name: N/A  . Number of children: 2  . Years of education: N/A   Occupational History  . retired    Social History Main Topics  . Smoking status: Former Research scientist (life sciences)  . Smokeless tobacco: Never Used     Comment: Quit >30 yrs ago  . Alcohol use Yes     Comment: 1 glass wine dialy  . Drug use: No  . Sexual activity: Not on file   Other Topics Concern  . 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 Always use your most recent med list.          albuterol 108 (90 Base) MCG/ACT inhaler Commonly known as:  PROVENTIL  HFA;VENTOLIN HFA Inhale 2 puffs into the lungs every 6 (six) hours as needed for wheezing or shortness of breath.   ALPRAZolam 0.5 MG tablet Commonly known as:  XANAX Take 1 tablet (0.5 mg total) by mouth at bedtime as needed for anxiety or sleep.   Biotin 10 MG Tabs Take 1 tablet by mouth daily. Reported on 06/01/2015   CENTRUM SILVER PO Take 1 each by mouth daily.   DULoxetine 30 MG capsule Commonly known as:  CYMBALTA Take 3 capsules (90 mg total) by mouth daily.   Fish Oil 1200 MG Caps Take 1 capsule by mouth daily.   glucosamine-chondroitin 500-400 MG tablet Take 1 tablet by mouth 3 (three) times daily. Reported on 06/01/2015   hydrocortisone 25 MG suppository Commonly known as:  ANUSOL-HC Place 1 suppository (25 mg total) rectally 2 (two) times daily as needed for hemorrhoids or itching.   metoprolol 50 MG tablet Commonly known as:  LOPRESSOR Take 1 tablet (50 mg total) by mouth 2 (two) times daily.   OSCAL 500/200 D-3 500-200 MG-UNIT tablet Generic drug:  calcium-vitamin D Take 1 tablet by mouth daily.   pantoprazole 40 MG tablet Commonly known as:  PROTONIX Take 1 tablet (40 mg total) by mouth daily.          Objective:   Physical Exam BP 118/74 (BP Location: Left Arm, Patient Position: Sitting, Cuff Size: Normal)   Pulse (!) 105   Temp 98.5 F (36.9 C) (Oral)   Resp 14   Ht 5\' 3"  (1.6 m)   Wt 155 lb 2 oz (70.4 kg)   SpO2 94%   BMI 27.48 kg/m  General:   Well developed, well nourished . NAD.  HEENT:  Normocephalic . Face symmetric, atraumatic Lungs:  few rhonchi with cough Normal respiratory effort, no intercostal retractions, no accessory muscle use. Heart: RRR,  no murmur.  no pretibial edema bilaterally  Abdomen:  Not distended, soft, non-tender. No rebound or rigidity.   DRE: Normal sphincter tone, + external hemorrhoids, brown stools Anoscopy: Multiple internal hemorrhoids, small, one of them looks slightly erythematous Skin: Not pale.  Not jaundice Neurologic:  alert & oriented X3.  Speech normal, gait appropriate for age and unassisted Psych--  Cognition and judgment appear intact.  Cooperative with normal attention span and concentration.  Behavior appropriate. No anxious or depressed appearing.    Assessment & Plan:   Assessment> HTN Depression, Anxiety Tremor dx 11-2014 GERD with esophageal stricture Breast cancer, 1997, lumpectomy, XRT, released from oncology  PLAN: Depression anxiety: Controlled, continue Cymbalta Insomnia: Continue Xanax as needed, contract and UDS today. Fatigue: Still there but improved. Red Blood per rectum: new problem. Benign features, negative colonoscopy 2015, + hemorrhoids on exam which  is the likely etiology. Recommend Anusol, observation. If she gets worse she definitely needs to let me know. Cough: Improving, recommend Mucinex and albuterol as needed. RTC CPX 11-2015

## 2015-09-20 NOTE — Progress Notes (Signed)
Pre visit review using our clinic review tool, if applicable. No additional management support is needed unless otherwise documented below in the visit note. 

## 2015-09-26 ENCOUNTER — Telehealth: Payer: Self-pay

## 2015-09-26 NOTE — Telephone Encounter (Signed)
UDS: 09/20/2015  Negative for Alprazolam:PRN   Low risk per Dr. Larose Kells 09/26/2015

## 2015-09-28 ENCOUNTER — Other Ambulatory Visit: Payer: Self-pay | Admitting: Internal Medicine

## 2015-09-29 ENCOUNTER — Ambulatory Visit (INDEPENDENT_AMBULATORY_CARE_PROVIDER_SITE_OTHER): Payer: Medicare Other | Admitting: Psychology

## 2015-09-29 ENCOUNTER — Ambulatory Visit: Payer: Self-pay | Admitting: Internal Medicine

## 2015-09-29 DIAGNOSIS — F4323 Adjustment disorder with mixed anxiety and depressed mood: Secondary | ICD-10-CM | POA: Diagnosis not present

## 2015-11-02 ENCOUNTER — Other Ambulatory Visit: Payer: Self-pay | Admitting: Internal Medicine

## 2015-11-29 ENCOUNTER — Encounter: Payer: Self-pay | Admitting: Internal Medicine

## 2015-11-29 DIAGNOSIS — R232 Flushing: Secondary | ICD-10-CM

## 2015-12-22 ENCOUNTER — Ambulatory Visit (INDEPENDENT_AMBULATORY_CARE_PROVIDER_SITE_OTHER): Payer: Medicare Other | Admitting: Internal Medicine

## 2015-12-22 ENCOUNTER — Encounter: Payer: Self-pay | Admitting: Internal Medicine

## 2015-12-22 VITALS — BP 120/78 | HR 104 | Temp 98.3°F | Resp 12 | Ht 63.0 in | Wt 158.0 lb

## 2015-12-22 DIAGNOSIS — F418 Other specified anxiety disorders: Secondary | ICD-10-CM | POA: Diagnosis not present

## 2015-12-22 DIAGNOSIS — I10 Essential (primary) hypertension: Secondary | ICD-10-CM | POA: Diagnosis not present

## 2015-12-22 DIAGNOSIS — Z13820 Encounter for screening for osteoporosis: Secondary | ICD-10-CM

## 2015-12-22 DIAGNOSIS — Z0000 Encounter for general adult medical examination without abnormal findings: Secondary | ICD-10-CM

## 2015-12-22 DIAGNOSIS — F329 Major depressive disorder, single episode, unspecified: Secondary | ICD-10-CM

## 2015-12-22 DIAGNOSIS — Z780 Asymptomatic menopausal state: Secondary | ICD-10-CM

## 2015-12-22 DIAGNOSIS — Z23 Encounter for immunization: Secondary | ICD-10-CM | POA: Diagnosis not present

## 2015-12-22 DIAGNOSIS — F419 Anxiety disorder, unspecified: Secondary | ICD-10-CM

## 2015-12-22 LAB — BASIC METABOLIC PANEL
BUN: 9 mg/dL (ref 7–25)
CHLORIDE: 100 mmol/L (ref 98–110)
CO2: 25 mmol/L (ref 20–31)
CREATININE: 0.79 mg/dL (ref 0.60–0.93)
Calcium: 9.3 mg/dL (ref 8.6–10.4)
Glucose, Bld: 109 mg/dL — ABNORMAL HIGH (ref 65–99)
POTASSIUM: 5 mmol/L (ref 3.5–5.3)
SODIUM: 138 mmol/L (ref 135–146)

## 2015-12-22 LAB — LIPID PANEL
Cholesterol: 170 mg/dL (ref <200)
HDL: 66 mg/dL (ref >50)
LDL CALC: 74 mg/dL (ref <100)
TRIGLYCERIDES: 149 mg/dL (ref <150)
Total CHOL/HDL Ratio: 2.6 Ratio (ref <5.0)
VLDL: 30 mg/dL (ref <30)

## 2015-12-22 NOTE — Patient Instructions (Signed)
Get your blood work before you leave    Next visit in one year, call for refill as needed.   Fall Prevention and Home Safety Falls cause injuries and can affect all age groups. It is possible to use preventive measures to significantly decrease the likelihood of falls. There are many simple measures which can make your home safer and prevent falls. OUTDOORS  Repair cracks and edges of walkways and driveways.  Remove high doorway thresholds.  Trim shrubbery on the main path into your home.  Have good outside lighting.  Clear walkways of tools, rocks, debris, and clutter.  Check that handrails are not broken and are securely fastened. Both sides of steps should have handrails.  Have leaves, snow, and ice cleared regularly.  Use sand or salt on walkways during winter months.  In the garage, clean up grease or oil spills. BATHROOM  Install night lights.  Install grab bars by the toilet and in the tub and shower.  Use non-skid mats or decals in the tub or shower.  Place a plastic non-slip stool in the shower to sit on, if needed.  Keep floors dry and clean up all water on the floor immediately.  Remove soap buildup in the tub or shower on a regular basis.  Secure bath mats with non-slip, double-sided rug tape.  Remove throw rugs and tripping hazards from the floors. BEDROOMS  Install night lights.  Make sure a bedside light is easy to reach.  Do not use oversized bedding.  Keep a telephone by your bedside.  Have a firm chair with side arms to use for getting dressed.  Remove throw rugs and tripping hazards from the floor. KITCHEN  Keep handles on pots and pans turned toward the center of the stove. Use back burners when possible.  Clean up spills quickly and allow time for drying.  Avoid walking on wet floors.  Avoid hot utensils and knives.  Position shelves so they are not too high or low.  Place commonly used objects within easy reach.  If  necessary, use a sturdy step stool with a grab bar when reaching.  Keep electrical cables out of the way.  Do not use floor polish or wax that makes floors slippery. If you must use wax, use non-skid floor wax.  Remove throw rugs and tripping hazards from the floor. STAIRWAYS  Never leave objects on stairs.  Place handrails on both sides of stairways and use them. Fix any loose handrails. Make sure handrails on both sides of the stairways are as long as the stairs.  Check carpeting to make sure it is firmly attached along stairs. Make repairs to worn or loose carpet promptly.  Avoid placing throw rugs at the top or bottom of stairways, or properly secure the rug with carpet tape to prevent slippage. Get rid of throw rugs, if possible.  Have an electrician put in a light switch at the top and bottom of the stairs. OTHER FALL PREVENTION TIPS  Wear low-heel or rubber-soled shoes that are supportive and fit well. Wear closed toe shoes.  When using a stepladder, make sure it is fully opened and both spreaders are firmly locked. Do not climb a closed stepladder.  Add color or contrast paint or tape to grab bars and handrails in your home. Place contrasting color strips on first and last steps.  Learn and use mobility aids as needed. Install an electrical emergency response system.  Turn on lights to avoid dark areas. Replace light bulbs  that burn out immediately. Get light switches that glow.  Arrange furniture to create clear pathways. Keep furniture in the same place.  Firmly attach carpet with non-skid or double-sided tape.  Eliminate uneven floor surfaces.  Select a carpet pattern that does not visually hide the edge of steps.  Be aware of all pets. OTHER HOME SAFETY TIPS  Set the water temperature for 120 F (48.8 C).  Keep emergency numbers on or near the telephone.  Keep smoke detectors on every level of the home and near sleeping areas. Document Released: 12/28/2001  Document Revised: 07/09/2011 Document Reviewed: 03/29/2011 [facility] Patient Information 2015 [place], [place]. This information is not intended to replace advice given to you by your health care provider. Make sure you discuss any questions you have with your health care provider.   Preventive Care for Adults Ages 57 and over  Blood pressure check.** / Every 1 to 2 years.  Lipid and cholesterol check.**/ Every 5 years beginning at age 81.  Lung cancer screening. / Every year if you are aged 44-80 years and have a 30-pack-year history of smoking and currently smoke or have quit within the past 15 years. Yearly screening is stopped once you have quit smoking for at least 15 years or develop a health problem that would prevent you from having lung cancer treatment.  Fecal occult blood test (FOBT) of stool. / Every year beginning at age 58 and continuing until age 53. You may not have to do this test if you get a colonoscopy every 10 years.  Flexible sigmoidoscopy** or colonoscopy.** / Every 5 years for a flexible sigmoidoscopy or every 10 years for a colonoscopy beginning at age 63 and continuing until age 72.  Hepatitis C blood test.** / For all people born from [birth year] through 1963 and any individual with known risks for hepatitis C.  Abdominal aortic aneurysm (AAA) screening.** / A one-time screening for ages 67 to 58 years who are current or former smokers.  Skin self-exam. / Monthly.  Influenza vaccine. / Every year.  Tetanus, diphtheria, and acellular pertussis (Tdap/Td) vaccine.** / 1 dose of Td every 10 years.  Varicella vaccine.** / Consult your health care provider.  Zoster vaccine.** / 1 dose for adults aged 73 years or older.  Pneumococcal 13-valent conjugate (PCV13) vaccine.** / Consult your health care provider.  Pneumococcal polysaccharide (PPSV23) vaccine.** / 1 dose for all adults aged 63 years and older.  Meningococcal vaccine.** / Consult your health care  provider.  Hepatitis A vaccine.** / Consult your health care provider.  Hepatitis B vaccine.** / Consult your health care provider.  Haemophilus influenzae type b (Hib) vaccine.** / Consult your health care provider. **Family history and personal history of risk and conditions may change your health care provider's recommendations. Document Released: 03/05/2001 Document Revised: 01/12/2013 Document Reviewed: 06/04/2010 [facility] Patient Information 2015 [place], [place]. This information is not intended to replace advice given to you by your health care provider. Make sure you discuss any questions you have with your health care provider.

## 2015-12-22 NOTE — Assessment & Plan Note (Addendum)
Td 8- 2010, pneumonia shot 2011, prevnar 2016 shingles shot 2011 Flu shot today  Colonoscopy:  11/22/2002, May 2015, negative, 10 years ([facility]) Female cared --sees [place].   No h/o  abnormal Pap tests. Likely she won't  need any further Pap smears Mammogram 03-2015 (-); pt expect Mrs Raquel Sarna to do a breast exam.  continue with her healthy diet and exercise

## 2015-12-22 NOTE — Progress Notes (Signed)
Pre visit review using our clinic review tool, if applicable. No additional management support is needed unless otherwise documented below in the visit note. 

## 2015-12-22 NOTE — Progress Notes (Signed)
Subjective:    Patient ID: Monique Ballard, female    DOB: 1940/10/19, 75 y.o.   MRN: [id number]  DOS:  12/22/2015 Type of visit - description :  Here for Medicare AWV:   1. Risk factors based on Past M, S, F history: reviewed 2. Physical Activities:  less active this year, plans to increase exercise  3. Depression/mood: controlled, on meds 4. Hearing:  No problems noted or reported  5. ADL's: independent, drives  6. Fall Risk: no recent  fall, prevention discussed  7. home Safety: does feel safe at home  8. Height, weight, & visual acuity: see VS, sees eye doctor regulalrly 9. Counseling: provided 10. Labs ordered based on risk factors: if needed  11. Referral Coordination: if needed 12. Care Plan, see assessment and plan , written personalized plan provided , see AVS 13. Cognitive Assessment: motor skills and cognition appropriate for age 50. Care team updated  15. End-of-life care discussed, rec a HC POA   In addition, today we discussed the following: HTN: Good med compliance, rarely has ambulatory BPs but they are normal Depression, Anxiety: Well-controlled with current medications GERD : On PPIs, asymptomatic Breast cancer-up-to-date on her mammograms    Review of Systems  Constitutional: No fever. No chills. No unexplained wt changes. No unusual sweats but has hot flashes.  HEENT: No dental problems, no ear discharge, no facial swelling, no voice changes. No eye discharge, no eye  redness , no  intolerance to light   Respiratory: No wheezing , no  difficulty breathing. No cough , no mucus production  Cardiovascular: No CP, no leg swelling , no  Palpitations  GI: no nausea, no vomiting, no diarrhea , no  abdominal pain.  No blood in the stools. No dysphagia, no odynophagia    Endocrine: No polyphagia, no polyuria , no polydipsia  GU: No dysuria, gross hematuria, difficulty urinating. No urinary urgency, no frequency.  Musculoskeletal: No joint swellings or  unusual aches or pains  Skin: No change in the color of the skin, palor , no  Rash  Allergic, immunologic: No environmental allergies , no  food allergies  Neurological: No dizziness no  syncope. No headaches. No diplopia, no slurred, no slurred speech, no motor deficits, no facial  Numbness  Hematological: No enlarged lymph nodes, no easy bruising , no unusual bleedings  Psychiatry: No suicidal ideas, no hallucinations, no beavior problems, no confusion.  No unusual/severe anxiety, no depression  Past Medical History:  Diagnosis Date  . Breast cancer ([place])    Dx 1997, lumpectomy, XRT  . Depression   . GERD (gastroesophageal reflux disease)    w/"stretching" remotely  . HTN (hypertension)     Past Surgical History:  Procedure Laterality Date  . BREAST LUMPECTOMY  1997  . CESAREAN SECTION     x2  . PELVIC FRACTURE SURGERY     endometriosis, remotely    Social History   Social History  . Marital status: Married    Spouse name: N/A  . Number of children: 2  . Years of education: N/A   Occupational History  . retired    Social History Main Topics  . Smoking status: Former Research scientist (life sciences)  . Smokeless tobacco: Never Used     Comment: Quit >30 yrs ago  . Alcohol use Yes     Comment: 1 glass wine dialy  . Drug use: No  . Sexual activity: Not on file   Other Topics Concern  . 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 Hypertension Mother   . Stroke Mother   . Heart attack Father     elderly  . Heart disease Father     CHF  . Throat cancer Other   . Breast cancer Neg Hx   . Colon cancer Neg Hx   . Diabetes Neg Hx        Medication List       Accurate as of 12/22/15  2:40 PM. Always use your most recent med list.          albuterol 108 (90 Base) MCG/ACT inhaler Commonly known as:   PROVENTIL HFA;VENTOLIN HFA Inhale 2 puffs into the lungs every 6 (six) hours as needed for wheezing or shortness of breath.   ALPRAZolam 0.5 MG tablet Commonly known as:  XANAX Take 1 tablet (0.5 mg total) by mouth at bedtime as needed for anxiety or sleep.   Biotin 10 MG Tabs Take 1 tablet by mouth daily. Reported on 06/01/2015   CENTRUM SILVER PO Take 1 each by mouth daily.   DULoxetine 30 MG capsule Commonly known as:  CYMBALTA Take 3 capsules (90 mg total) by mouth daily.   Fish Oil 1200 MG Caps Take 1 capsule by mouth daily.   glucosamine-chondroitin 500-400 MG tablet Take 1 tablet by mouth 3 (three) times daily. Reported on 06/01/2015   hydrocortisone 25 MG suppository Commonly known as:  ANUSOL-HC Place 1 suppository (25 mg total) rectally 2 (two) times daily as needed for hemorrhoids or itching.   metoprolol 50 MG tablet Commonly known as:  LOPRESSOR TAKE 1 TABLET BY MOUTH 2 TIMES DAILY.   OSCAL 500/200 D-3 500-200 MG-UNIT tablet Generic drug:  calcium-vitamin D Take 1 tablet by mouth daily.   pantoprazole 40 MG tablet Commonly known as:  PROTONIX Take 1 tablet (40 mg total) by mouth daily.          Objective:   Physical Exam BP 120/78 (BP Location: Left Arm, Patient Position: Sitting, Cuff Size: Small)   Pulse (!) 104   Temp 98.3 F (36.8 C) (Oral)   Resp 12   Ht 5\' 3"  (1.6 m)   Wt 158 lb (71.7 kg)   SpO2 93%   BMI 27.99 kg/m   General:   Well developed, well nourished . NAD.  Neck: No  thyromegaly  HEENT:  Normocephalic . Face symmetric, atraumatic Lungs:  CTA B Normal respiratory effort, no intercostal retractions, no accessory muscle use. Heart: RRR,  no murmur.  No pretibial edema bilaterally  Abdomen:  Not distended, soft, non-tender. No rebound or rigidity.   Skin: Exposed areas without rash. Not pale. Not jaundice Neurologic:  alert & oriented X3.  Speech normal, gait appropriate for age and unassisted Strength symmetric and  appropriate for age.  Psych: Cognition and judgment appear intact.  Cooperative with normal attention span and concentration.  Behavior appropriate. No anxious or depressed appearing.    Assessment & Plan:   Assessment> HTN Depression, Anxiety Tremor dx 11-2014 GERD with esophageal stricture Breast cancer, 1997, lumpectomy, XRT, released from oncology  PLAN: HTN: Controlled, check a BMP, FLP. Continue with Lopressor. Depression anxiety: Currently well controlled on Cymbalta and sporadic Xanax Red blood per rectum: Resolved Breast  cancer, history of: Up-to-date on mammograms. RTC one year

## 2015-12-24 NOTE — Assessment & Plan Note (Signed)
HTN: Controlled, check a BMP, FLP. Continue with Lopressor. Depression anxiety: Currently well controlled on Cymbalta and sporadic Xanax Red blood per rectum: Resolved Breast cancer, history of: Up-to-date on mammograms. RTC one year

## 2016-01-02 ENCOUNTER — Other Ambulatory Visit: Payer: Self-pay | Admitting: Internal Medicine

## 2016-01-10 DIAGNOSIS — L309 Dermatitis, unspecified: Secondary | ICD-10-CM | POA: Diagnosis not present

## 2016-01-18 ENCOUNTER — Ambulatory Visit: Payer: Medicare Other | Admitting: Nurse Practitioner

## 2016-01-31 DIAGNOSIS — Z23 Encounter for immunization: Secondary | ICD-10-CM | POA: Diagnosis not present

## 2016-01-31 DIAGNOSIS — L309 Dermatitis, unspecified: Secondary | ICD-10-CM | POA: Diagnosis not present

## 2016-02-06 ENCOUNTER — Encounter: Payer: Self-pay | Admitting: Nurse Practitioner

## 2016-02-06 ENCOUNTER — Ambulatory Visit (INDEPENDENT_AMBULATORY_CARE_PROVIDER_SITE_OTHER): Payer: Medicare Other | Admitting: Nurse Practitioner

## 2016-02-06 VITALS — BP 134/86 | HR 64 | Ht 61.75 in | Wt 161.0 lb

## 2016-02-06 DIAGNOSIS — C50412 Malignant neoplasm of upper-outer quadrant of left female breast: Secondary | ICD-10-CM

## 2016-02-06 DIAGNOSIS — Z124 Encounter for screening for malignant neoplasm of cervix: Secondary | ICD-10-CM

## 2016-02-06 DIAGNOSIS — N760 Acute vaginitis: Secondary | ICD-10-CM

## 2016-02-06 DIAGNOSIS — E2839 Other primary ovarian failure: Secondary | ICD-10-CM

## 2016-02-06 DIAGNOSIS — Z01419 Encounter for gynecological examination (general) (routine) without abnormal findings: Secondary | ICD-10-CM

## 2016-02-06 DIAGNOSIS — Z01411 Encounter for gynecological examination (general) (routine) with abnormal findings: Secondary | ICD-10-CM

## 2016-02-06 DIAGNOSIS — Z0000 Encounter for general adult medical examination without abnormal findings: Secondary | ICD-10-CM

## 2016-02-06 DIAGNOSIS — R55 Syncope and collapse: Secondary | ICD-10-CM

## 2016-02-06 MED ORDER — NYSTATIN 100000 UNIT/GM EX CREA: TOPICAL_CREAM | CUTANEOUS | 3 refills | Status: DC

## 2016-02-06 MED ORDER — TRIAMCINOLONE ACETONIDE 0.025 % EX OINT: TOPICAL_OINTMENT | CUTANEOUS | 3 refills | Status: DC

## 2016-02-06 NOTE — Patient Instructions (Signed)

## 2016-02-06 NOTE — Progress Notes (Signed)
Patient ID: Monique Ballard, female   DOB: July 23, 1940, 76 y.o.   MRN: [id number]  75 y.o. G2P2002 Married  Caucasian Fe here for [facility] annual exam.  Former pt was last seen here about 6-7 yrs ago.  She was having vaso symptoms when she first went through menopause.  She now has a return in vaso symptoms and is concerned about why she is having them and what to do about them.  She is a breast cancer survivor from 28 treated with lumpectomy and radiation. Last pap done by me in 2010. History of endometriosis with surgery in her 20's.  C section X 2.  She is not SA due to husbands prostate cancer surgery.  Patient's last menstrual period was 01/22/1992 (within months).          Sexually active: No.  The current method of family planning is none.    Exercising: No.  The patient does not participate in regular exercise at present. Smoker:  no  Health Maintenance: Pap:  01/26/08, Negative MMG:  03/23/15, 3D, Bi-Rads 1:  Negative Colonoscopy:  06/08/13, Normal, repeat in 10 years BMD: 10/23/11, T Score: -1.2 Spine / 0.4 Left Femur Neck TDaP: 10/01/14 Shingles: 08/28/09 Pneumonia: 08/28/09 Pneumovax, 08/10/14 Prevnar-13 Hep C and HIV: Not indicated due to age Labs: PCP takes care of all labs (in [facility])   reports that she has quit smoking. She has never used smokeless tobacco. She reports that she drinks alcohol. She reports that she does not use drugs.  Past Medical History:  Diagnosis Date  . Breast cancer ([place]) fall 1997    Left Lumpectomy, XRT.  treated with Tamoxifem and Evista  . Depression   . Endometriosis in her 20's   surgery to help clear endometriosis to obtain a pregnancy.  Marland Kitchen GERD (gastroesophageal reflux disease)    w/"stretching" remotely  . HTN (hypertension)     Past Surgical History:  Procedure Laterality Date  . BREAST LUMPECTOMY Left fall 1997   Lymph nodes X 3 were negative, ER+, Radiation treatmetn.  Took Tamoxifem X 5 yrs then Evista for 3-5 yrs.  . CESAREAN SECTION     x2   first pregnancy breach and second normal  . PELVIC FRACTURE SURGERY     endometriosis, remotely    Current Outpatient Prescriptions  Medication Sig Dispense Refill  . ALPRAZolam (XANAX) 0.5 MG tablet Take 1 tablet (0.5 mg total) by mouth at bedtime as needed for anxiety or sleep. 30 tablet 2  . calcium-vitamin D (OSCAL 500/200 D-3) 500-200 MG-UNIT per tablet Take 1 tablet by mouth daily.      . clobetasol ointment (TEMOVATE) [id number] % Apply 1 application topically 2 (two) times daily.    . DULoxetine (CYMBALTA) 30 MG capsule Take 3 capsules (90 mg total) by mouth daily. 90 capsule 5  . metoprolol (LOPRESSOR) 50 MG tablet TAKE 1 TABLET BY MOUTH 2 TIMES DAILY. 60 tablet 6  . Multiple Vitamins-Minerals (CENTRUM SILVER PO) Take 1 each by mouth daily.      . Omega-3 Fatty Acids (FISH OIL) 1200 MG CAPS Take 1 capsule by mouth daily.     . pantoprazole (PROTONIX) 40 MG tablet Take 1 tablet (40 mg total) by mouth daily. 90 tablet 3  . Turmeric 500 MG CAPS Take 2 capsules by mouth daily.    Marland Kitchen nystatin cream (MYCOSTATIN) Mix in your palm with the Triamcinolone cream andApply to affected area BID for up to 7 days. 30 g 3  . triamcinolone (KENALOG)  0.025 % ointment Mix in your palm with the Nystatin cream and apply to affected areas BID for 7 days 30 g 3   No current facility-administered medications for this visit.     Family History  Problem Relation Age of Onset  . Hypertension Mother   . Stroke Mother   . Heart attack Father     elderly  . Heart disease Father     CHF  . Throat cancer Other   . Breast cancer Neg Hx   . Colon cancer Neg Hx   . Diabetes Neg Hx     ROS:  Pertinent items are noted in HPI.  Otherwise, a comprehensive ROS was negative.  Exam:   BP 134/86 (BP Location: Right Arm, Patient Position: Sitting, Cuff Size: Normal)   Pulse 64   Ht 5' 1.75" (1.568 m)   Wt 161 lb (73 kg)   LMP 01/22/1992 (Within Months)   BMI 29.69 kg/m  Height: 5' 1.75" (156.8 cm) Ht Readings  from Last 3 Encounters:  02/06/16 5' 1.75" (1.568 m)  12/22/15 5\' 3"  (1.6 m)  09/20/15 5\' 3"  (1.6 m)    General appearance: alert, cooperative and appears stated age Head: Normocephalic, without obvious abnormality, atraumatic Neck: no adenopathy, supple, symmetrical, trachea midline and thyroid normal to inspection and palpation Lungs: clear to auscultation bilaterally Breasts: normal appearance, no masses or tenderness, on the right.  On the left breast is surgical and radiation changes without mass.   Heart: regular rate and rhythm Abdomen: soft, non-tender; no masses,  no organomegaly Extremities: extremities normal, atraumatic, no cyanosis or edema Skin: Skin color, texture, turgor normal. No rashes or lesions Lymph nodes: Cervical, supraclavicular, and axillary nodes normal. No abnormal inguinal nodes palpated Neurologic: Grossly normal   Pelvic: External genitalia:  no lesions              Urethra:  normal appearing urethra with no masses, tenderness or lesions              Bartholin's and Skene's: normal                 Vagina: very atrophic appearing vagina with normal color and discharge, no lesions              Cervix: anteverted              Pap taken: No. Bimanual Exam:  Uterus:  normal size, contour, position, consistency, mobility, non-tender              Adnexa: no mass, fullness, tenderness - limited due to discomfort from atrophy               Rectovaginal: Confirms               Anus:  normal sphincter tone, no lesions  Chaperone present: yes  A:  Well Woman with normal exam  Postmenopausal since 1997  History of Left breast cancer, ER+, treated with lumpectomy, radiation, Tamoxifen, Evista  History of endometriosis requiring surgery to obtain pregnancy in her 20's  Recurrence of vaso symptoms ? Etiology - concerns that it may be endocrine related.  History of HTN, Vit D deficiency, depression X 1 yr.   P:   Reviewed health and wellness pertinent to  exam  Pap smear not done  Mammogram is due 03/2016  Will get a HGB AIC and follow  Counseled on breast self exam, mammography screening, adequate intake of calcium and vitamin D, diet and exercise return  annually or prn  An After Visit Summary was printed and given to the patient.

## 2016-02-07 ENCOUNTER — Encounter: Payer: Self-pay | Admitting: Nurse Practitioner

## 2016-02-07 LAB — WET PREP BY MOLECULAR PROBE
CANDIDA SPECIES: NEGATIVE
Gardnerella vaginalis: NEGATIVE
TRICHOMONAS VAG: NEGATIVE

## 2016-02-07 LAB — HEMOGLOBIN A1C
Hgb A1c MFr Bld: 5.4 %
Mean Plasma Glucose: 108 mg/dL

## 2016-02-08 NOTE — Progress Notes (Signed)
Encounter reviewed by Dr. Aundria Rud. Will try to add thyroid function studies to her labs already drawn. I recommend return to PCP if vasomotor symptoms do not resolve.

## 2016-02-09 ENCOUNTER — Encounter: Payer: Self-pay | Admitting: Nurse Practitioner

## 2016-02-12 ENCOUNTER — Other Ambulatory Visit: Payer: Self-pay | Admitting: Nurse Practitioner

## 2016-02-12 DIAGNOSIS — N951 Menopausal and female climacteric states: Secondary | ICD-10-CM

## 2016-02-12 DIAGNOSIS — R232 Flushing: Secondary | ICD-10-CM

## 2016-02-12 NOTE — Progress Notes (Signed)
Pt will be coming back here for her thyroid panel and TSH.  Order is placed.

## 2016-02-12 NOTE — Telephone Encounter (Signed)
Patient is scheduled for lab appointment on 02/16/16 for thyroid panel with TSH.  Please enter order.

## 2016-02-16 ENCOUNTER — Other Ambulatory Visit (INDEPENDENT_AMBULATORY_CARE_PROVIDER_SITE_OTHER): Payer: Medicare Other

## 2016-02-16 DIAGNOSIS — N951 Menopausal and female climacteric states: Secondary | ICD-10-CM | POA: Diagnosis not present

## 2016-02-16 DIAGNOSIS — R232 Flushing: Secondary | ICD-10-CM

## 2016-02-17 LAB — THYROID PANEL WITH TSH
FREE THYROXINE INDEX: 2.3 (ref 1.4–3.8)
T3 UPTAKE: 26 % (ref 22–35)
T4 TOTAL: 8.7 ug/dL (ref 4.5–12.0)
TSH: 1.38 mIU/L

## 2016-02-20 ENCOUNTER — Telehealth: Payer: Self-pay

## 2016-02-20 MED ORDER — ALPRAZOLAM 0.5 MG PO TABS: 0.5000 mg | ORAL_TABLET | Freq: Every evening | ORAL | 2 refills | Status: DC | PRN

## 2016-02-20 NOTE — Telephone Encounter (Signed)
Rx printed, awaiting MD signature.  

## 2016-02-20 NOTE — Telephone Encounter (Signed)
Ok 30, 2 RFs

## 2016-02-20 NOTE — Telephone Encounter (Signed)
Pt is requesting refill on Alprazolam.  Last OV: 12/22/2015 Last Fill: 06/01/2015 #30 and 2RF UDS: 09/20/2015 Low risk  Please advise.

## 2016-02-20 NOTE — Telephone Encounter (Signed)
Rx faxed to [facility] pharmacy.  

## 2016-02-23 ENCOUNTER — Encounter: Payer: Self-pay | Admitting: Internal Medicine

## 2016-02-26 ENCOUNTER — Encounter: Payer: Self-pay | Admitting: Internal Medicine

## 2016-02-26 DIAGNOSIS — Z23 Encounter for immunization: Secondary | ICD-10-CM | POA: Diagnosis not present

## 2016-02-26 DIAGNOSIS — L309 Dermatitis, unspecified: Secondary | ICD-10-CM | POA: Diagnosis not present

## 2016-02-27 ENCOUNTER — Encounter: Payer: Self-pay | Admitting: Internal Medicine

## 2016-02-27 ENCOUNTER — Ambulatory Visit: Payer: Self-pay | Admitting: Internal Medicine

## 2016-02-27 ENCOUNTER — Ambulatory Visit (INDEPENDENT_AMBULATORY_CARE_PROVIDER_SITE_OTHER): Payer: Medicare Other | Admitting: Internal Medicine

## 2016-02-27 VITALS — BP 118/76 | HR 76 | Temp 98.2°F | Resp 12 | Ht 61.75 in | Wt 160.1 lb

## 2016-02-27 DIAGNOSIS — F418 Other specified anxiety disorders: Secondary | ICD-10-CM | POA: Diagnosis not present

## 2016-02-27 DIAGNOSIS — J069 Acute upper respiratory infection, unspecified: Secondary | ICD-10-CM

## 2016-02-27 DIAGNOSIS — J40 Bronchitis, not specified as acute or chronic: Secondary | ICD-10-CM | POA: Diagnosis not present

## 2016-02-27 DIAGNOSIS — F329 Major depressive disorder, single episode, unspecified: Secondary | ICD-10-CM

## 2016-02-27 DIAGNOSIS — F419 Anxiety disorder, unspecified: Secondary | ICD-10-CM

## 2016-02-27 MED ORDER — HYDROCODONE-HOMATROPINE 5-1.5 MG/5ML PO SYRP: 5.0000 mL | ORAL_SOLUTION | Freq: Every evening | ORAL | 0 refills | Status: DC | PRN

## 2016-02-27 MED ORDER — AZITHROMYCIN 250 MG PO TABS
ORAL_TABLET | ORAL | 0 refills | Status: DC
Start: 2016-02-27 — End: 2016-11-25

## 2016-02-27 MED ORDER — BENZONATATE 200 MG PO CAPS: 200.0000 mg | ORAL_CAPSULE | Freq: Three times a day (TID) | ORAL | 0 refills | Status: DC | PRN

## 2016-02-27 NOTE — Progress Notes (Signed)
Pre visit review using our clinic review tool, if applicable. No additional management support is needed unless otherwise documented below in the visit note. 

## 2016-02-27 NOTE — Assessment & Plan Note (Signed)
URI/bronchitis: Sxs persistent for 3 weeks, will continue Mucinex DM, Flonase, Tessalon and hydrocodone for cough suppression. If no better will get a Z-Pak. See instructions Depression anxiety: Takes Xanax as sporadically, will get a UDS and contract.

## 2016-02-27 NOTE — Progress Notes (Signed)
Subjective:    Patient ID: Monique Ballard, female    DOB: 1940/11/28, 76 y.o.   MRN: [id number]  DOS:  02/27/2016 Type of visit - description : acute Interval history: Sx started 3 weeks ago, husband w/ similar sx  Nose congestion,cough, ST Takes Mucinex DM as needed. She is concerned because she has no make any progress in 3 weeks.   Review of Systems No fever chills No nausea vomiting, occasional he has noted some watery diarrhea without blood in the stools. No wheezing but she feels is slightly short of breath No lower extremity edema or chest pain  Past Medical History:  Diagnosis Date  . Breast cancer ([place]) fall 1997    Left Lumpectomy, XRT.  treated with Tamoxifem and Evista  . Depression   . Endometriosis in her 20's   surgery to help clear endometriosis to obtain a pregnancy.  Marland Kitchen GERD (gastroesophageal reflux disease)    w/"stretching" remotely  . HTN (hypertension)     Past Surgical History:  Procedure Laterality Date  . BREAST LUMPECTOMY Left fall 1997   Lymph nodes X 3 were negative, ER+, Radiation treatmetn.  Took Tamoxifem X 5 yrs then Evista for 3-5 yrs.  . CESAREAN SECTION     x2  first pregnancy breach and second normal  . PELVIC FRACTURE SURGERY     endometriosis, remotely    Social History   Social History  . Marital status: Married    Spouse name: N/A  . Number of children: 2  . Years of education: N/A   Occupational History  . retired    Social History Main Topics  . Smoking status: Former Research scientist (life sciences)  . Smokeless tobacco: Never Used     Comment: Quit >30 yrs ago  . Alcohol use Yes     Comment: 1 glass wine dialy  . Drug use: No  . Sexual activity: No   Other Topics Concern  . 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      Medication List       Accurate as of 02/27/16  5:42 PM. Always use your most recent med list.          ALPRAZolam 0.5 MG tablet Commonly known as:  XANAX Take 1 tablet (0.5 mg total) by mouth at bedtime as needed for anxiety or sleep.   azithromycin 250 MG tablet Commonly known as:  ZITHROMAX Z-PAK 2 tabs a day the first day, then 1 tab a day x 4 days   benzonatate 200 MG capsule Commonly known as:  TESSALON Take 1 capsule (200 mg total) by mouth 3 (three) times daily as needed for cough.   CENTRUM SILVER PO Take 1 each by mouth daily.   DULoxetine 30 MG capsule  Commonly known as:  CYMBALTA Take 3 capsules (90 mg total) by mouth daily.   Fish Oil 1200 MG Caps Take 1 capsule by mouth daily.   HYDROcodone-homatropine 5-1.5 MG/5ML syrup Commonly known as:  HYCODAN Take 5 mLs by mouth at bedtime as needed for cough.   metoprolol 50 MG tablet Commonly known as:  LOPRESSOR TAKE 1 TABLET BY MOUTH 2 TIMES DAILY.   OSCAL 500/200 D-3 500-200 MG-UNIT tablet Generic drug:  calcium-vitamin D Take 1 tablet by mouth daily.   pantoprazole 40 MG tablet Commonly known as:  PROTONIX Take 1 tablet (40 mg total) by mouth daily.   Turmeric 500 MG Caps Take 2 capsules by mouth daily.          Objective:   Physical Exam BP 118/76 (BP Location: Left Arm, Patient Position: Sitting, Cuff Size: Small)   Pulse 76   Temp 98.2 F (36.8 C) (Oral)   Resp 12   Ht 5' 1.75" (1.568 m)   Wt 160 lb 2 oz (72.6 kg)   LMP 01/22/1992 (Within Months)   SpO2 94%   BMI 29.52  kg/m  General:   Well developed, well nourished . NAD.  HEENT:  Normocephalic . Face symmetric, atraumatic. TMs slightly bulge, not red. Throat: Symmetric, slightly red, no discharge. Nose is slightly congested Lungs:  CTA B Normal respiratory effort, no intercostal retractions, no accessory muscle use. Heart: RRR,  no murmur.  No pretibial edema bilaterally  Skin: Not pale. Not jaundice Neurologic:  alert & oriented X3.  Speech normal, gait appropriate for age and unassisted Psych--  Cognition and judgment appear intact.  Cooperative with normal attention span and concentration.  Behavior appropriate. No anxious or depressed appearing.      Assessment & Plan:   Assessment> HTN Depression, Anxiety Tremor dx 11-2014 GERD with esophageal stricture Breast cancer, 1997, lumpectomy, XRT, released from oncology  PLAN:  URI/bronchitis: Sxs persistent for 3 weeks, will continue Mucinex DM, Flonase, Tessalon and hydrocodone for cough suppression. If no better will get a Z-Pak. See instructions Depression anxiety: Takes Xanax as sporadically, will get a UDS and contract.

## 2016-02-27 NOTE — Patient Instructions (Signed)
Please go to the lab and provide a urine sample  ---  Rest, fluids , tylenol  For cough:  Take Mucinex DM twice a day as needed until better Also take Tessalon Perles 3 times a day as needed If the cough is severe at night, take hydrocodone  (drowsiness)  For nasal congestion: Use OTC Nasocort or Flonase : 2 nasal sprays on each side of the nose in the morning until you feel better   Avoid decongestants such as  Pseudoephedrine or phenylephrine    Take the antibiotic as prescribed  (zithromax ) only if not better in the next 3 or 4 days  Call if not gradually better over the next  10 days  Call anytime if the symptoms are severe

## 2016-03-04 ENCOUNTER — Encounter: Payer: Self-pay | Admitting: Internal Medicine

## 2016-03-04 MED ORDER — METOPROLOL TARTRATE 50 MG PO TABS: 50.0000 mg | ORAL_TABLET | Freq: Two times a day (BID) | ORAL | 5 refills | Status: DC

## 2016-04-01 ENCOUNTER — Ambulatory Visit (INDEPENDENT_AMBULATORY_CARE_PROVIDER_SITE_OTHER): Payer: Medicare Other | Admitting: Psychology

## 2016-04-01 DIAGNOSIS — F4323 Adjustment disorder with mixed anxiety and depressed mood: Secondary | ICD-10-CM | POA: Diagnosis not present

## 2016-04-04 ENCOUNTER — Other Ambulatory Visit: Payer: Self-pay

## 2016-04-04 MED ORDER — DULOXETINE HCL 30 MG PO CPEP: 90.0000 mg | ORAL_CAPSULE | Freq: Every day | ORAL | 5 refills | Status: DC

## 2016-04-18 ENCOUNTER — Other Ambulatory Visit: Payer: Self-pay | Admitting: Nurse Practitioner

## 2016-04-18 DIAGNOSIS — Z1231 Encounter for screening mammogram for malignant neoplasm of breast: Secondary | ICD-10-CM

## 2016-04-22 DIAGNOSIS — L309 Dermatitis, unspecified: Secondary | ICD-10-CM | POA: Diagnosis not present

## 2016-04-23 ENCOUNTER — Ambulatory Visit (INDEPENDENT_AMBULATORY_CARE_PROVIDER_SITE_OTHER): Payer: Medicare Other | Admitting: Psychology

## 2016-04-23 DIAGNOSIS — F4323 Adjustment disorder with mixed anxiety and depressed mood: Secondary | ICD-10-CM

## 2016-05-08 ENCOUNTER — Ambulatory Visit
Admission: RE | Admit: 2016-05-08 | Discharge: 2016-05-08 | Disposition: A | Discharge status: Home / Self Care | Payer: Medicare Other | Source: Ambulatory Visit | Attending: Nurse Practitioner | Admitting: Nurse Practitioner

## 2016-05-08 DIAGNOSIS — Z1231 Encounter for screening mammogram for malignant neoplasm of breast: Secondary | ICD-10-CM | POA: Diagnosis not present

## 2016-05-08 DIAGNOSIS — E2839 Other primary ovarian failure: Secondary | ICD-10-CM

## 2016-05-08 DIAGNOSIS — Z780 Asymptomatic menopausal state: Secondary | ICD-10-CM | POA: Diagnosis not present

## 2016-05-08 DIAGNOSIS — M8588 Other specified disorders of bone density and structure, other site: Secondary | ICD-10-CM | POA: Diagnosis not present

## 2016-05-23 ENCOUNTER — Ambulatory Visit (INDEPENDENT_AMBULATORY_CARE_PROVIDER_SITE_OTHER): Payer: Medicare Other | Admitting: Psychology

## 2016-05-23 DIAGNOSIS — F4323 Adjustment disorder with mixed anxiety and depressed mood: Secondary | ICD-10-CM

## 2016-07-15 ENCOUNTER — Other Ambulatory Visit: Payer: Self-pay | Admitting: Internal Medicine

## 2016-07-15 NOTE — Telephone Encounter (Signed)
Rx printed, awaiting MD signature.  

## 2016-07-15 NOTE — Telephone Encounter (Signed)
Okay 30 and 3 refills 

## 2016-07-15 NOTE — Telephone Encounter (Signed)
Rx faxed to [facility] pharmacy.  

## 2016-07-15 NOTE — Telephone Encounter (Signed)
Pt is requesting refill on alprazolam 0.5mg .  Last OV: 02/27/2016 Last Fill: 02/20/2016 #30 and 2RF UDS: 09/20/2015 Low risk  [place] Controlled Substance Database printed for PCP review.    Please advise.

## 2016-08-21 ENCOUNTER — Telehealth: Payer: Self-pay | Admitting: Obstetrics and Gynecology

## 2016-08-21 ENCOUNTER — Encounter: Payer: Self-pay | Admitting: Nurse Practitioner

## 2016-08-21 NOTE — Telephone Encounter (Signed)
Left message for patient to call to reschedule [name] appointment

## 2016-09-28 ENCOUNTER — Other Ambulatory Visit: Payer: Self-pay | Admitting: Internal Medicine

## 2016-10-24 ENCOUNTER — Other Ambulatory Visit: Payer: Self-pay | Admitting: Internal Medicine

## 2016-11-06 DIAGNOSIS — H43819 Vitreous degeneration, unspecified eye: Secondary | ICD-10-CM | POA: Diagnosis not present

## 2016-11-18 ENCOUNTER — Telehealth: Payer: Self-pay | Admitting: Internal Medicine

## 2016-11-18 NOTE — Telephone Encounter (Signed)
Monique Ballard called the office today regarding AWV, but pt stated that she needs to schedule an appt with Dr. Larose Kells instead. Scheduled appt with Dr. Larose Kells for 11/25/16. Informed pt that she can schedule AWV when she comes into the office.

## 2016-11-25 ENCOUNTER — Ambulatory Visit (INDEPENDENT_AMBULATORY_CARE_PROVIDER_SITE_OTHER): Payer: Medicare Other | Admitting: Internal Medicine

## 2016-11-25 ENCOUNTER — Encounter: Payer: Self-pay | Admitting: Internal Medicine

## 2016-11-25 VITALS — BP 124/78 | HR 81 | Temp 97.8°F | Resp 14 | Ht 62.0 in | Wt 162.2 lb

## 2016-11-25 DIAGNOSIS — R2232 Localized swelling, mass and lump, left upper limb: Secondary | ICD-10-CM | POA: Diagnosis not present

## 2016-11-25 DIAGNOSIS — Z853 Personal history of malignant neoplasm of breast: Secondary | ICD-10-CM | POA: Diagnosis not present

## 2016-11-25 DIAGNOSIS — Z23 Encounter for immunization: Secondary | ICD-10-CM

## 2016-11-25 DIAGNOSIS — F419 Anxiety disorder, unspecified: Secondary | ICD-10-CM | POA: Diagnosis not present

## 2016-11-25 DIAGNOSIS — F329 Major depressive disorder, single episode, unspecified: Secondary | ICD-10-CM | POA: Diagnosis not present

## 2016-11-25 DIAGNOSIS — F32A Depression, unspecified: Secondary | ICD-10-CM

## 2016-11-25 MED ORDER — SERTRALINE HCL 50 MG PO TABS: ORAL_TABLET | ORAL | 1 refills | Status: DC

## 2016-11-25 NOTE — Progress Notes (Signed)
Subjective:    Patient ID: Monique Ballard, female    DOB: 07-Nov-1940, 76 y.o.   MRN: [id number]  DOS:  11/25/2016 Type of visit - description : Acute visit Interval history: Has a couple of concerns A week ago noted a lump at the left armpit, area is nontender to palpation.  There is no redness, discharge. Also reports depression: Related to her son, had some problems with the law.  Fortunately he is doing well mentally, emotionally, he does not have any drug problems and he is on his way to do better.  Nevertheless she is quite worried about him.    Review of Systems Denies any recent fever, chills. No rash in the armpit No suicidal ideas  Past Medical History:  Diagnosis Date  . Breast cancer ([place]) fall 1997    Left Lumpectomy, XRT.  treated with Tamoxifem and Evista  . Depression   . Endometriosis in her 20's   surgery to help clear endometriosis to obtain a pregnancy.  Marland Kitchen GERD (gastroesophageal reflux disease)    w/"stretching" remotely  . HTN (hypertension)     Past Surgical History:  Procedure Laterality Date  . BREAST LUMPECTOMY Left fall 1997   Lymph nodes X 3 were negative, ER+, Radiation treatmetn.  Took Tamoxifem X 5 yrs then Evista for 3-5 yrs.  . CESAREAN SECTION     x2  first pregnancy breach and second normal  . PELVIC FRACTURE SURGERY     endometriosis, remotely    Social History   Socioeconomic History  . Marital status: Married    Spouse name: Not on file  . Number of children: 2  . Years of education: Not on file  . Highest education level: Not on file  Social Needs  . Financial resource strain: Not on file  . Food insecurity - worry: Not on file  . Food insecurity - inability: Not on file  . Transportation needs - medical: Not on file  . Transportation needs - non-medical: Not on file  Occupational History  . Occupation: retired  Tobacco Use  . Smoking status: Former Research scientist (life sciences)  . Smokeless tobacco: Never Used  . Tobacco comment: Quit >30 yrs  ago  Substance and Sexual Activity  . Alcohol use: Yes    Comment: 1 glass wine dialy  . Drug use: No  . Sexual activity: No    Birth control/protection: None  Other Topics Concern  . 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      Medication List        Accurate as of 11/25/16 11:59 PM. Always use your most recent med list.          ALPRAZolam 0.5 MG tablet Commonly known as:  XANAX Take 1 tablet (0.5 mg total) by mouth  at bedtime as needed for anxiety or sleep.   CENTRUM SILVER PO Take 1 each by mouth daily.   DULoxetine 30 MG capsule Commonly known as:  CYMBALTA Take 3 capsules (90 mg total) by mouth daily.   ESTROVEN MENOPAUSE RELIEF PO   Fish Oil 1200 MG Caps Take 1 capsule by mouth daily.   metoprolol tartrate 50 MG tablet Commonly known as:  LOPRESSOR Take 1 tablet (50 mg total) by mouth 2 (two) times daily.   OSCAL 500/200 D-3 500-200 MG-UNIT tablet Generic drug:  calcium-vitamin D Take 1 tablet by mouth daily.   pantoprazole 40 MG tablet Commonly known as:  PROTONIX Take 1 tablet (40 mg total) by mouth daily.   sertraline 50 MG tablet Commonly known as:  ZOLOFT 1 tablet daily for 2 weeks Then increase to 2 tablets daily   Turmeric 500 MG Caps Take 2 capsules by mouth daily.          Objective:   Physical Exam BP 124/78  (BP Location: Right Arm, Patient Position: Sitting, Cuff Size: Small)   Pulse 81   Temp 97.8 F (36.6 C) (Oral)   Resp 14   Ht 5\' 2"  (1.575 m)   Wt 162 lb 4 oz (73.6 kg)   LMP 01/22/1992 (Within Months)   SpO2 97%   BMI 29.68 kg/m  General:   Well developed, well nourished . NAD.  HEENT:  Normocephalic . Face symmetric, atraumatic Breast: Right breast: No dominant mass, skin and nipples normal to inspection - palpation, axillary area without mass    Left breast: Status post surgery, skin normal, nipple normal without discharge, on palpation the breast tissue is dense and slightly hard but without mass (she had XRT there) At the armpit she does have a superficial, soft, nontender mass, approximately 2 x 1 inches in size. Neck: No LAD, mass.  Supraclavicular area: No masses Skin: Not pale. Not jaundice Neurologic:  alert & oriented X3.  Speech normal, gait appropriate for age and unassisted Psych--  Cognition and judgment appear intact.  Cooperative with normal attention span and concentration.  Behavior appropriate. mildly anxious and depressed.  appearing.      Assessment & Plan:   Assessment> HTN Depression, Anxiety Tremor dx 11-2014 GERD with esophageal stricture L Breast cancer, 1997, lumpectomy, XRT, released from oncology  PLAN:  Anxiety, depression: Used to be well controlled on Cymbalta, under a lot of stress here lately, see HPI.  Would like to try something different, many of her friends take Zoloft and would like to try that. We agreed to wean off Cymbalta and start Zoloft.  See instructions.  To call if she has problems. Lump, left armpit: h/o  left breast cancer, S/P lumpectomy, XRT.  Last mammogram April 2018.  Will get a diagnostic left MMG axillary ultrasound.  Further advised with results. RTC 6 weeks

## 2016-11-25 NOTE — Patient Instructions (Addendum)
Next visit 6 weeks  Please make an appointment  ==================  Decrease duloxetine to 2 tablets a day for 10 days  Decrease duloxetine to 1 tablet a day start Zoloft 50 mg daily After 2 weeks: -stop duloxetine -Increase Zoloft to 100 mg daily

## 2016-11-25 NOTE — Progress Notes (Signed)
Pre visit review using our clinic review tool, if applicable. No additional management support is needed unless otherwise documented below in the visit note. 

## 2016-11-26 NOTE — Assessment & Plan Note (Signed)
Anxiety, depression: Used to be well controlled on Cymbalta, under a lot of stress here lately, see HPI.  Would like to try something different, many of her friends take Zoloft and would like to try that. We agreed to wean off Cymbalta and start Zoloft.  See instructions.  To call if she has problems. Lump, left armpit: h/o  left breast cancer, S/P lumpectomy, XRT.  Last mammogram April 2018.  Will get a diagnostic left MMG axillary ultrasound.  Further advised with results. RTC 6 weeks

## 2016-12-03 ENCOUNTER — Ambulatory Visit
Admission: RE | Admit: 2016-12-03 | Discharge: 2016-12-03 | Disposition: A | Discharge status: Home / Self Care | Payer: Medicare Other | Source: Ambulatory Visit | Attending: Internal Medicine | Admitting: Internal Medicine

## 2016-12-03 DIAGNOSIS — R928 Other abnormal and inconclusive findings on diagnostic imaging of breast: Secondary | ICD-10-CM | POA: Diagnosis not present

## 2016-12-03 DIAGNOSIS — N6489 Other specified disorders of breast: Secondary | ICD-10-CM | POA: Diagnosis not present

## 2016-12-03 IMAGING — US US AXILLARY LEFT
1 series · 4 of 4 positions shown · non-contrast
Comparison: Previous exam(s).

CLINICAL DATA: 75-year-old female with history of left breast
lumpectomy in [2Y] presenting for evaluation of a palpable area of
concern in the left axilla.

EXAM:
2D DIGITAL DIAGNOSTIC UNILATERAL LEFT MAMMOGRAM WITH CAD AND ADJUNCT
TOMO
LEFT AXILLARY ULTRASOUND

[Series 1: us axillary left · 0.07mm/px · 4 of 4 slices shown]
[im 1/4]
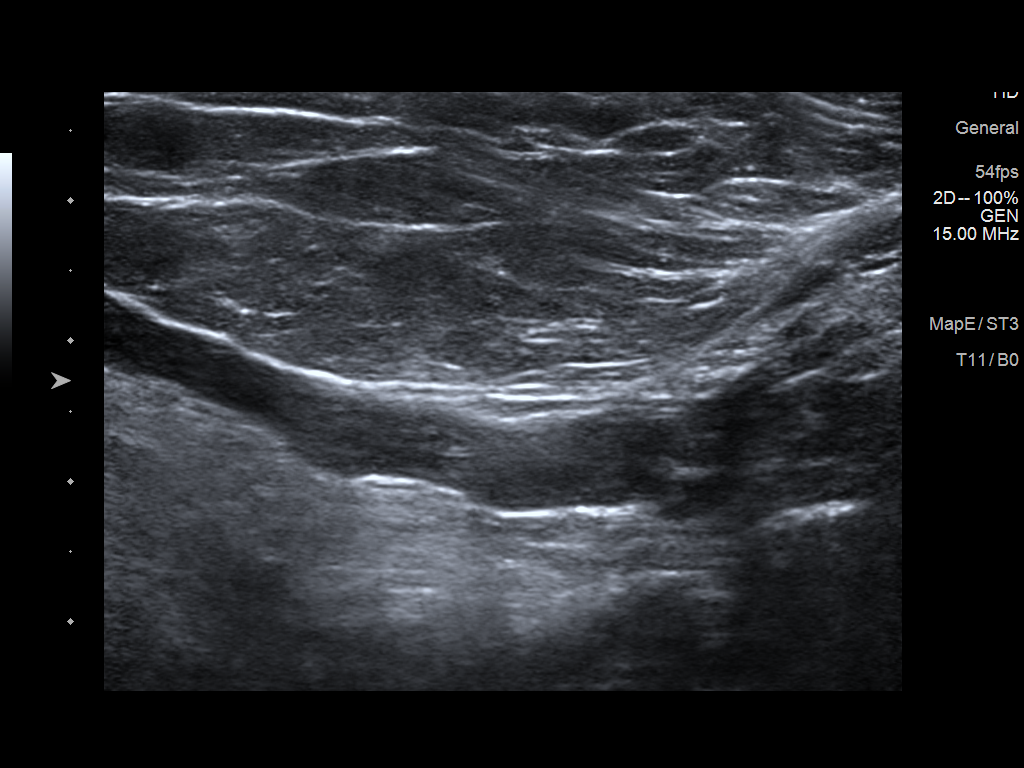
[im 2/4]
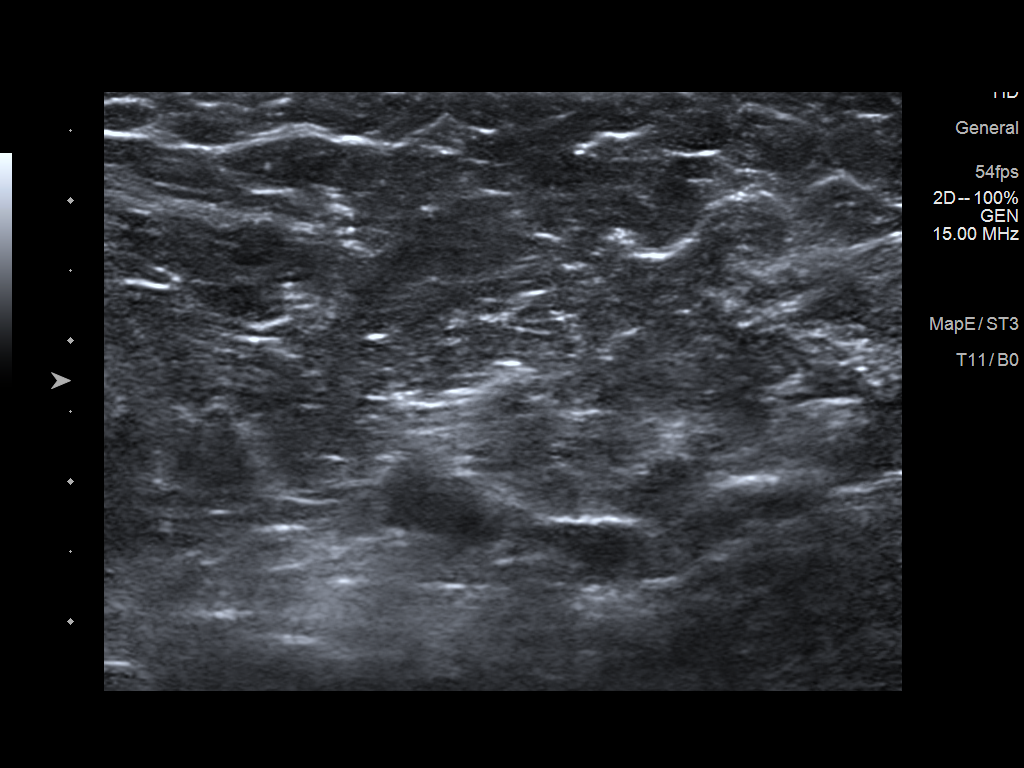
[im 3/4]
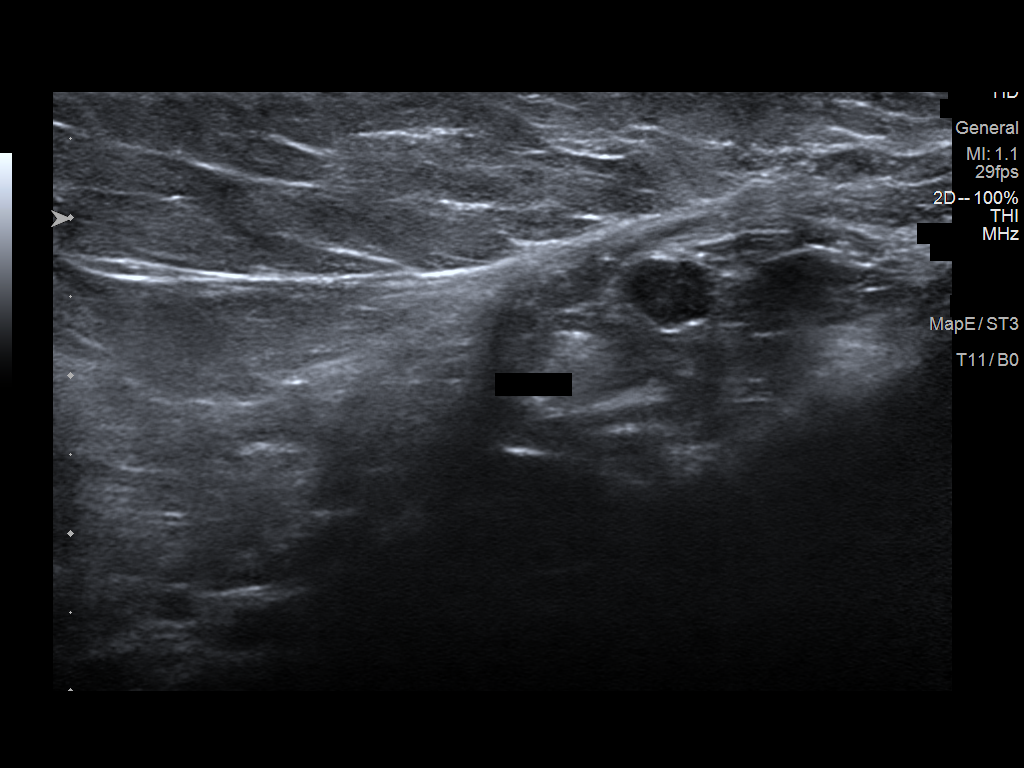
[im 4/4]
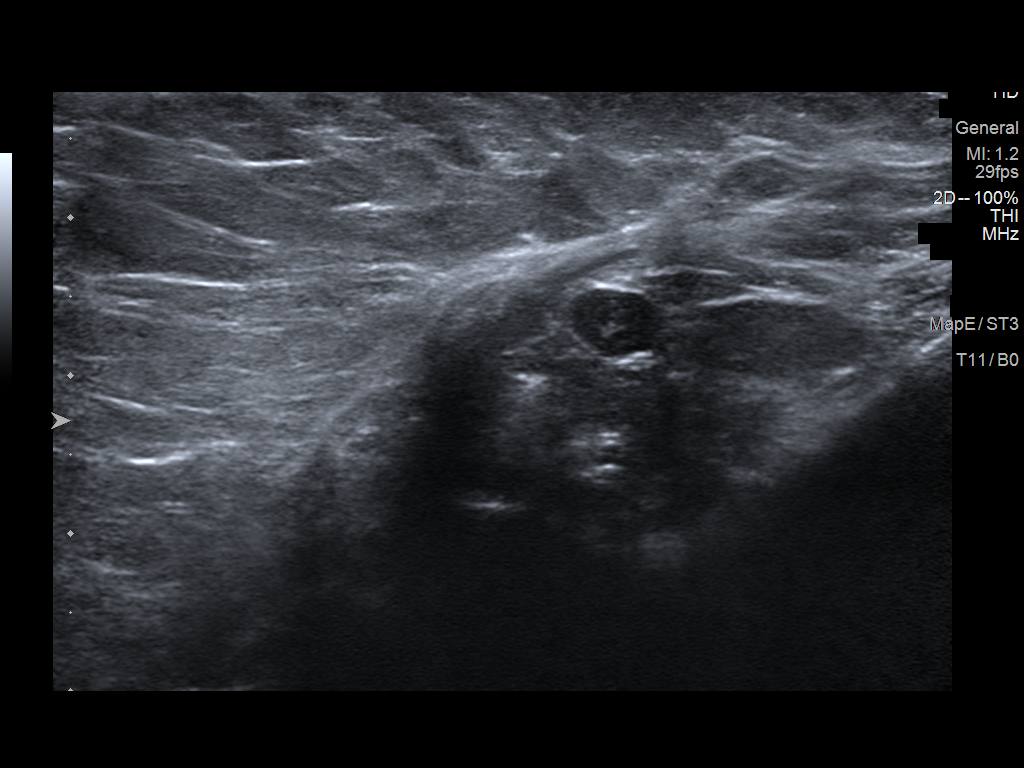

[4 of 4 positions shown; findings below may reference images not displayed]

ACR Breast Density Category b: There are scattered areas of
fibroglandular density.
FINDINGS: Surgical changes are noted in the superior left breast on the MLO
view. The BB indicating the palpable site of concern is just
superior to the lumpectomy site. No suspicious calcifications,
masses or areas of distortion are seen in the left breast.

Mammographic images were processed with CAD.

Physical exam of the palpable site demonstrates normal soft tissues
superior to the lumpectomy scar.

Ultrasound targeted to the palpable site demonstrates normal
subcutaneous fatty tissue. A small benign-appearing lymph node is
identified.
IMPRESSION: 1. There are no mammographic or targeted sonographic abnormalities
at the palpable site of concern in the left axilla.

RECOMMENDATION:
Return to routine screening mammography is recommended. The patient
will be due for screening in [DATE].

I have discussed the findings and recommendations with the patient.
Results were also provided in writing at the conclusion of the
visit. If applicable, a reminder letter will be sent to the patient
regarding the next appointment.

BI-RADS CATEGORY  1: Negative.

## 2016-12-11 ENCOUNTER — Encounter: Payer: Self-pay | Admitting: Internal Medicine

## 2016-12-16 ENCOUNTER — Other Ambulatory Visit: Payer: Self-pay | Admitting: Internal Medicine

## 2017-01-06 ENCOUNTER — Encounter: Payer: Self-pay | Admitting: Internal Medicine

## 2017-01-06 ENCOUNTER — Ambulatory Visit (INDEPENDENT_AMBULATORY_CARE_PROVIDER_SITE_OTHER): Payer: Medicare Other | Admitting: Internal Medicine

## 2017-01-06 VITALS — BP 126/78 | HR 78 | Temp 97.7°F | Resp 14 | Ht 62.0 in | Wt 162.2 lb

## 2017-01-06 DIAGNOSIS — F419 Anxiety disorder, unspecified: Secondary | ICD-10-CM

## 2017-01-06 DIAGNOSIS — F32A Depression, unspecified: Secondary | ICD-10-CM

## 2017-01-06 DIAGNOSIS — F329 Major depressive disorder, single episode, unspecified: Secondary | ICD-10-CM | POA: Diagnosis not present

## 2017-01-06 MED ORDER — ALPRAZOLAM 0.5 MG PO TABS: ORAL_TABLET | ORAL | 1 refills | Status: DC

## 2017-01-06 NOTE — Progress Notes (Signed)
Pre visit review using our clinic review tool, if applicable. No additional management support is needed unless otherwise documented below in the visit note. 

## 2017-01-06 NOTE — Patient Instructions (Addendum)
Next visit 2 months, please make an appointment  Go back on Cymbalta 30 mg: 1 tablet a day for  7 days 2 tablets a day for 7 days Then go back to her original dose of 3 tablets daily  Call for prescription if needed  You takes Xanax at bedtime mostly to help your sleep.  He is also okay to take half tablet in the morning if you are very shaky or anxious.

## 2017-01-06 NOTE — Progress Notes (Signed)
Subjective:    Patient ID: Monique Ballard, female    DOB: 27-Jan-1940, 76 y.o.   MRN: [id number]  DOS:  01/06/2017 Type of visit - description : f/u Interval history: Anxiety-depression: Since the last visit, she switch from Cymbalta to Zoloft, she is not doing well, her anxiety and depression have resurface. She is not sleeping well. Still is stressed and worried about her son.  Review of Systems Admits to decreased appetite, loose stools sometimes. She has good family support, her husband is doing okay and they get along well. Denies suicidal ideas  Past Medical History:  Diagnosis Date  . Breast cancer ([place]) fall 1997    Left Lumpectomy, XRT.  treated with Tamoxifem and Evista  . Depression   . Endometriosis in her 20's   surgery to help clear endometriosis to obtain a pregnancy.  Marland Kitchen GERD (gastroesophageal reflux disease)    w/"stretching" remotely  . HTN (hypertension)     Past Surgical History:  Procedure Laterality Date  . BREAST LUMPECTOMY Left fall 1997   Lymph nodes X 3 were negative, ER+, Radiation treatmetn.  Took Tamoxifem X 5 yrs then Evista for 3-5 yrs.  . CESAREAN SECTION     x2  first pregnancy breach and second normal  . PELVIC FRACTURE SURGERY     endometriosis, remotely    Social History   Socioeconomic History  . Marital status: Married    Spouse name: Not on file  . Number of children: 2  . Years of education: Not on file  . Highest education level: Not on file  Social Needs  . Financial resource strain: Not on file  . Food insecurity - worry: Not on file  . Food insecurity - inability: Not on file  . Transportation needs - medical: Not on file  . Transportation needs - non-medical: Not on file  Occupational History  . Occupation: retired  Tobacco Use  . Smoking status: Former Research scientist (life sciences)  . Smokeless tobacco: Never Used  . Tobacco comment: Quit >30 yrs ago  Substance and Sexual Activity  . Alcohol use: Yes    Comment: 1 glass wine dialy    . Drug use: No  . Sexual activity: No    Birth control/protection: None  Other Topics Concern  . 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      Medication List        Accurate as of 01/06/17 11:59 PM. Always use your most recent med list.          ALPRAZolam 0.5 MG tablet Commonly known as:  XANAX Half tablet in the morning if needed for anxiety, 1 tablet at bedtime if needed for insomnia  CENTRUM SILVER PO Take 1 each by mouth daily.   DULoxetine 30 MG capsule Commonly known as:  CYMBALTA Take 3 capsules (90 mg total) by mouth daily.   ESTROVEN MENOPAUSE RELIEF PO   Fish Oil 1200 MG Caps Take 1 capsule by mouth daily.   metoprolol tartrate 50 MG tablet Commonly known as:  LOPRESSOR Take 1 tablet (50 mg total) by mouth 2 (two) times daily.   OSCAL 500/200 D-3 500-200 MG-UNIT tablet Generic drug:  calcium-vitamin D Take 1 tablet by mouth daily.   pantoprazole 40 MG tablet Commonly known as:  PROTONIX Take 1 tablet (40 mg total) by mouth daily.   Turmeric 500 MG Caps Take 2 capsules by mouth daily.          Objective:   Physical Exam BP 126/78 (BP Location: Left Arm, Patient Position: Sitting, Cuff Size: Small)   Pulse 78    Temp 97.7 F (36.5 C) (Oral)   Resp 14   Ht 5\' 2"  (1.575 m)   Wt 162 lb 4 oz (73.6 kg)   LMP 01/22/1992 (Within Months)   SpO2 96%   BMI 29.68 kg/m  General:   Well developed, well nourished . NAD.  HEENT:  Normocephalic . Face symmetric, atraumatic Skin: Not pale. Not jaundice Neurologic:  alert & oriented X3.  Speech normal, gait appropriate for age and unassisted Psych--  Cognition and judgment appear intact.  Cooperative with normal attention span and concentration.  Behavior appropriate. Slightly anxious appearing, not depressed, not tearful     Assessment & Plan:   Assessment> HTN Depression, Anxiety Tremor dx 11-2014 GERD with esophageal stricture L Breast cancer, 1997, lumpectomy, XRT, released from oncology  PLAN:  Anxiety, depression: Since the last visit she switch from Cymbalta to setraline; not doing well, cymbalta was definitely helping better,  likes to go back on it.  She was to sertraline 100 mg daily, stopped 2 days ago. Plan: Go back on Cymbalta gradually, okay to continue Xanax at nighttime and occasionally half dose of Xanax during the day if needed. Send RF, see AVS . She had some counseling and that helped, rec  continue counseling  if possible. Lump, left armpit: [place] and  mammogram 11-2016 negative rtc 2 months

## 2017-01-07 NOTE — Assessment & Plan Note (Signed)
Anxiety, depression: Since the last visit she switch from Cymbalta to setraline; not doing well, cymbalta was definitely helping better,  likes to go back on it.  She was to sertraline 100 mg daily, stopped 2 days ago. Plan: Go back on Cymbalta gradually, okay to continue Xanax at nighttime and occasionally half dose of Xanax during the day if needed. Send RF, see AVS . She had some counseling and that helped, rec  continue counseling  if possible. Lump, left armpit: [place] and  mammogram 11-2016 negative rtc 2 months

## 2017-01-22 ENCOUNTER — Other Ambulatory Visit: Payer: Self-pay | Admitting: Internal Medicine

## 2017-03-10 ENCOUNTER — Ambulatory Visit (INDEPENDENT_AMBULATORY_CARE_PROVIDER_SITE_OTHER): Payer: Medicare Other | Admitting: Internal Medicine

## 2017-03-10 ENCOUNTER — Encounter: Payer: Self-pay | Admitting: Internal Medicine

## 2017-03-10 VITALS — BP 124/72 | HR 84 | Temp 97.5°F | Resp 14 | Ht 62.0 in | Wt 163.5 lb

## 2017-03-10 DIAGNOSIS — F419 Anxiety disorder, unspecified: Secondary | ICD-10-CM

## 2017-03-10 DIAGNOSIS — F329 Major depressive disorder, single episode, unspecified: Secondary | ICD-10-CM | POA: Diagnosis not present

## 2017-03-10 DIAGNOSIS — R251 Tremor, unspecified: Secondary | ICD-10-CM | POA: Diagnosis not present

## 2017-03-10 DIAGNOSIS — R945 Abnormal results of liver function studies: Secondary | ICD-10-CM | POA: Diagnosis not present

## 2017-03-10 DIAGNOSIS — K219 Gastro-esophageal reflux disease without esophagitis: Secondary | ICD-10-CM

## 2017-03-10 DIAGNOSIS — R7989 Other specified abnormal findings of blood chemistry: Secondary | ICD-10-CM

## 2017-03-10 MED ORDER — CLONAZEPAM 0.5 MG PO TABS
0.5000 mg | ORAL_TABLET | Freq: Two times a day (BID) | ORAL | 0 refills | Status: DC | PRN
Start: 2017-03-10 — End: 2017-04-19

## 2017-03-10 MED ORDER — DULOXETINE HCL 60 MG PO CPEP: 60.0000 mg | ORAL_CAPSULE | Freq: Two times a day (BID) | ORAL | 3 refills | Status: DC

## 2017-03-10 NOTE — Progress Notes (Signed)
Subjective:    Patient ID: Monique Ballard, female    DOB: 1940-05-30, 77 y.o.   MRN: [id number]  DOS:  03/10/2017 Type of visit - description : f/u Interval history: --Anxiety, depression: Good compliance of medication, not doing well, continue with anxiety and some depression. Still related to issues with her son; her daughter is also having some marital issues. --She also is concerned about having shakiness/tremors. --Also feels that is less steady on her feet, needs to hold onto handrails when she goes up or down stairs. --Having some stomach problems she believes due to feeling anxious: Decreased appetite, her stomach "turns" with food, having occasional food regurgitation.  Review of Systems No  fever chills No weight loss No actual heartburn, on PPIs.  No blood in the stools. Denies suicidal ideas Denies paresthesias  Admits to some neck and back pain on and off particularly back pain lately.  Past Medical History:  Diagnosis Date  . Breast cancer ([place]) fall 1997    Left Lumpectomy, XRT.  treated with Tamoxifem and Evista  . Depression   . Endometriosis in her 20's   surgery to help clear endometriosis to obtain a pregnancy.  Marland Kitchen GERD (gastroesophageal reflux disease)    w/"stretching" remotely  . HTN (hypertension)     Past Surgical History:  Procedure Laterality Date  . BREAST LUMPECTOMY Left fall 1997   Lymph nodes X 3 were negative, ER+, Radiation treatmetn.  Took Tamoxifem X 5 yrs then Evista for 3-5 yrs.  . CESAREAN SECTION     x2  first pregnancy breach and second normal  . PELVIC FRACTURE SURGERY     endometriosis, remotely    Social History   Socioeconomic History  . Marital status: Married    Spouse name: Not on file  . Number of children: 2  . Years of education: Not on file  . Highest education level: Not on file  Social Needs  . Financial resource strain: Not on file  . Food insecurity - worry: Not on file  . Food insecurity - inability: Not  on file  . Transportation needs - medical: Not on file  . Transportation needs - non-medical: Not on file  Occupational History  . Occupation: retired  Tobacco Use  . Smoking status: Former Research scientist (life sciences)  . Smokeless tobacco: Never Used  . Tobacco comment: Quit >30 yrs ago  Substance and Sexual Activity  . Alcohol use: Yes    Comment: 1 glass wine dialy  . Drug use: No  . Sexual activity: No    Birth control/protection: None  Other Topics Concern  . 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      Medication  List        Accurate as of 03/10/17 11:59 PM. Always use your most recent med list.          CENTRUM SILVER PO Take 1 each by mouth daily.   clonazePAM 0.5 MG tablet Commonly known as:  KLONOPIN Take 1 tablet (0.5 mg total) by mouth 2 (two) times daily as needed for anxiety.   DULoxetine 60 MG capsule Commonly known as:  CYMBALTA Take 1 capsule (60 mg total) by mouth 2 (two) times daily.   ESTROVEN MENOPAUSE RELIEF PO   Fish Oil 1200 MG Caps Take 1 capsule by mouth daily.   metoprolol tartrate 50 MG tablet Commonly known as:  LOPRESSOR Take 1 tablet (50 mg total) by mouth 2 (two) times daily.   OSCAL 500/200 D-3 500-200 MG-UNIT  tablet Generic drug:  calcium-vitamin D Take 1 tablet by mouth daily.   pantoprazole 40 MG tablet Commonly known as:  PROTONIX Take 1 tablet (40 mg total) by mouth daily.   Turmeric 500 MG Caps Take 2 capsules by mouth daily.          Objective:   Physical Exam BP 124/72 (BP Location: Left Arm, Patient Position: Sitting, Cuff Size: Small)   Pulse 84   Temp (!) 97.5 F (36.4 C) (Oral)   Resp 14   Ht 5\' 2"  (1.575 m)   Wt 163 lb 8 oz (74.2 kg)   LMP 01/22/1992 (Within Months)   SpO2 92%   BMI 29.90 kg/m  General:   Well developed, well nourished . NAD.  HEENT:  Normocephalic . Face symmetric, atraumatic Lungs:  CTA B Normal respiratory effort, no intercostal retractions, no accessory muscle use. Heart: RRR,  no murmur.  no pretibial edema bilaterally  Abdomen:  Not distended, soft, non-tender. No rebound or rigidity.   Skin: Not pale. Not jaundice Neurologic:  alert & oriented X3.  Speech normal, gait appropriate for age and unassisted No tremors noted, motor symmetric. Psych--  Cognition and judgment appear intact.  Cooperative with normal attention span and concentration.  Behavior appropriate. No anxious or depressed appearing.     Assessment & Plan:   Assessment HTN Depression, Anxiety Tremor dx 11-2014 GERD with esophageal stricture L Breast cancer, 1997, lumpectomy, XRT, released from oncology  PLAN:  Anxiety, depression: Sxs not well controlled, concerned about her son who had problems w/ the law; he is however doing ok, has a job; lives w the pt and her husband. Plan: Increase Cymbalta to 60 mg twice daily, change Xanax to clonazepam, my hope is to get better control of anxiety throughout the day without excessive sedation. Tremors: Started a while back, coincide with initiation of Cymbalta?  Patient not sure.  Check a TSH. GERD and dyspepsia: Have some GI symptoms, denies weight loss or blood per rectum.  We will get CMP and CBC.  Reassess on  RTC. Unsteadiness: Feels less steady of her feet, admits that used to be more active and exercise regularly but not in the last couple of years.  Probably deconditioning is playing a role.  I encouraged her to become more active, she has a daughter who is a PT, plans to talk to her about exercises RTC 6 weeks  Today, I spent more than 28   min with the patient: >50% of the time counseling regards depression, anxiety, listening to all her concerns, giving her feedback.  Talking about unsteadiness.

## 2017-03-10 NOTE — Patient Instructions (Addendum)
GO TO THE LAB : Get the blood work     GO TO THE FRONT DESK Schedule your next appointment for a checkup in 6 weeks  Increase Cymbalta to 60 mg twice daily  Stop Xanax, try clonazepam half tablet in the mornings for anxiety, 1 tablet at bedtime to sleep  increase your physical activity, talk with your daughter about exercises  See the new counselor

## 2017-03-10 NOTE — Progress Notes (Signed)
Pre visit review using our clinic review tool, if applicable. No additional management support is needed unless otherwise documented below in the visit note. 

## 2017-03-11 LAB — COMPREHENSIVE METABOLIC PANEL
ALBUMIN: 4 g/dL (ref 3.5–5.2)
ALK PHOS: 68 U/L (ref 39–117)
ALT: 50 U/L — AB (ref 0–35)
AST: 82 U/L — ABNORMAL HIGH (ref 0–37)
BILIRUBIN TOTAL: 0.8 mg/dL (ref 0.2–1.2)
BUN: 12 mg/dL (ref 6–23)
CALCIUM: 9.5 mg/dL (ref 8.4–10.5)
CO2: 30 mEq/L (ref 19–32)
CREATININE: 0.73 mg/dL (ref 0.40–1.20)
Chloride: 94 mEq/L — ABNORMAL LOW (ref 96–112)
GFR: 82.79 mL/min (ref >60.00)
Glucose, Bld: 121 mg/dL — ABNORMAL HIGH (ref 70–99)
Potassium: 4.3 mEq/L (ref 3.5–5.1)
Sodium: 132 mEq/L — ABNORMAL LOW (ref 135–145)
TOTAL PROTEIN: 7.1 g/dL (ref 6.0–8.3)

## 2017-03-11 LAB — CBC WITH DIFFERENTIAL/PLATELET
BASOS ABS: 0.1 10*3/uL (ref 0.0–0.1)
Basophils Relative: 0.9 % (ref 0.0–3.0)
EOS ABS: 0.1 10*3/uL (ref 0.0–0.7)
Eosinophils Relative: 0.8 % (ref 0.0–5.0)
HEMATOCRIT: 43.5 % (ref 36.0–46.0)
Hemoglobin: 14.6 g/dL (ref 12.0–15.0)
LYMPHS PCT: 18.7 % (ref 12.0–46.0)
Lymphs Abs: 2.1 10*3/uL (ref 0.7–4.0)
MCHC: 33.5 g/dL (ref 30.0–36.0)
MCV: 99.2 fl (ref 78.0–100.0)
MONOS PCT: 16.3 % — AB (ref 3.0–12.0)
Monocytes Absolute: 1.8 10*3/uL — ABNORMAL HIGH (ref 0.1–1.0)
NEUTROS PCT: 63.3 % (ref 43.0–77.0)
Neutro Abs: 7.1 10*3/uL (ref 1.4–7.7)
Platelets: 235 10*3/uL (ref 150.0–400.0)
RBC: 4.38 Mil/uL (ref 3.87–5.11)
RDW: 12.7 % (ref 11.5–15.5)
WBC: 11.2 10*3/uL — AB (ref 4.0–10.5)

## 2017-03-11 LAB — TSH: TSH: 2.27 u[IU]/mL (ref 0.35–4.50)

## 2017-03-11 NOTE — Assessment & Plan Note (Signed)
Anxiety, depression: Sxs not well controlled, concerned about her son who had problems w/ the law; he is however doing ok, has a job; lives w the pt and her husband. Plan: Increase Cymbalta to 60 mg twice daily, change Xanax to clonazepam, my hope is to get better control of anxiety throughout the day without excessive sedation. Tremors: Started a while back, coincide with initiation of Cymbalta?  Patient not sure.  Check a TSH. GERD and dyspepsia: Have some GI symptoms, denies weight loss or blood per rectum.  We will get CMP and CBC.  Reassess on RTC. Unsteadiness: Feels less steady of her feet, admits that used to be more active and exercise regularly but not in the last couple of years.  Probably deconditioning is playing a role.  I encouraged her to become more active, she has a daughter who is a PT, plans to talk to her about exercises RTC 6 weeks

## 2017-03-13 ENCOUNTER — Encounter: Payer: Self-pay | Admitting: Internal Medicine

## 2017-03-14 ENCOUNTER — Telehealth: Payer: Self-pay

## 2017-03-14 NOTE — Telephone Encounter (Signed)
Copied from [place] [id number]. Topic: General - Call Back - No Documentation >> Mar 14, 2017 12:47 PM Monique Ballard, NT wrote: Patient states she missed a call 03/13/17 about her lab results. I do not see any documentation. Please contact patient.

## 2017-03-14 NOTE — Telephone Encounter (Signed)
I spoke with the patient, see labs

## 2017-03-14 NOTE — Addendum Note (Signed)
Addended byDamita Dunnings D on: 03/14/2017 01:08 PM   Modules accepted: Orders

## 2017-03-14 NOTE — Telephone Encounter (Signed)
Pt returning your call

## 2017-03-25 ENCOUNTER — Encounter: Payer: Self-pay | Admitting: Internal Medicine

## 2017-04-03 ENCOUNTER — Other Ambulatory Visit (INDEPENDENT_AMBULATORY_CARE_PROVIDER_SITE_OTHER): Payer: Medicare Other

## 2017-04-03 DIAGNOSIS — R945 Abnormal results of liver function studies: Secondary | ICD-10-CM | POA: Diagnosis not present

## 2017-04-03 DIAGNOSIS — R7989 Other specified abnormal findings of blood chemistry: Secondary | ICD-10-CM

## 2017-04-03 LAB — COMPREHENSIVE METABOLIC PANEL
ALK PHOS: 61 U/L (ref 39–117)
ALT: 28 U/L (ref 0–35)
AST: 52 U/L — ABNORMAL HIGH (ref 0–37)
Albumin: 4 g/dL (ref 3.5–5.2)
BUN: 6 mg/dL (ref 6–23)
CHLORIDE: 103 meq/L (ref 96–112)
CO2: 31 mEq/L (ref 19–32)
Calcium: 9.6 mg/dL (ref 8.4–10.5)
Creatinine, Ser: 0.63 mg/dL (ref 0.40–1.20)
GFR: 98.12 mL/min (ref >60.00)
GLUCOSE: 142 mg/dL — AB (ref 70–99)
POTASSIUM: 4.1 meq/L (ref 3.5–5.1)
SODIUM: 138 meq/L (ref 135–145)
TOTAL PROTEIN: 6.8 g/dL (ref 6.0–8.3)
Total Bilirubin: 0.4 mg/dL (ref 0.2–1.2)

## 2017-04-19 ENCOUNTER — Telehealth: Payer: Self-pay | Admitting: Internal Medicine

## 2017-04-21 ENCOUNTER — Other Ambulatory Visit: Payer: Self-pay

## 2017-04-21 ENCOUNTER — Ambulatory Visit (INDEPENDENT_AMBULATORY_CARE_PROVIDER_SITE_OTHER): Payer: Medicare Other | Admitting: Internal Medicine

## 2017-04-21 ENCOUNTER — Encounter: Payer: Self-pay | Admitting: Internal Medicine

## 2017-04-21 VITALS — BP 126/70 | HR 80 | Temp 97.6°F | Resp 14 | Ht 62.0 in | Wt 163.5 lb

## 2017-04-21 DIAGNOSIS — F419 Anxiety disorder, unspecified: Secondary | ICD-10-CM | POA: Diagnosis not present

## 2017-04-21 DIAGNOSIS — Z79899 Other long term (current) drug therapy: Secondary | ICD-10-CM

## 2017-04-21 DIAGNOSIS — R296 Repeated falls: Secondary | ICD-10-CM

## 2017-04-21 DIAGNOSIS — F329 Major depressive disorder, single episode, unspecified: Secondary | ICD-10-CM

## 2017-04-21 DIAGNOSIS — R269 Unspecified abnormalities of gait and mobility: Secondary | ICD-10-CM | POA: Diagnosis not present

## 2017-04-21 NOTE — Progress Notes (Signed)
Pre visit review using our clinic review tool, if applicable. No additional management support is needed unless otherwise documented below in the visit note. 

## 2017-04-21 NOTE — Telephone Encounter (Signed)
rx sent

## 2017-04-21 NOTE — Progress Notes (Signed)
Subjective:    Patient ID: Monique Ballard, female    DOB: 1940/01/28, 77 y.o.   MRN: [id number]  DOS:  04/21/2017 Type of visit - description : F/U Interval history: Since the last office visit, things are going well. Anxiety much decreased Sleeping better Tremors are not worse GI symptoms decreased Unsteadiness unchanged, she actually had an additional fall since the last time.  No major injuries, no head or neck trauma.  Review of Systems   Past Medical History:  Diagnosis Date  . Breast cancer ([place]) fall 1997    Left Lumpectomy, XRT.  treated with Tamoxifem and Evista  . Depression   . Endometriosis in her 20's   surgery to help clear endometriosis to obtain a pregnancy.  Marland Kitchen GERD (gastroesophageal reflux disease)    w/"stretching" remotely  . HTN (hypertension)     Past Surgical History:  Procedure Laterality Date  . BREAST LUMPECTOMY Left fall 1997   Lymph nodes X 3 were negative, ER+, Radiation treatmetn.  Took Tamoxifem X 5 yrs then Evista for 3-5 yrs.  . CESAREAN SECTION     x2  first pregnancy breach and second normal  . PELVIC FRACTURE SURGERY     endometriosis, remotely    Social History   Socioeconomic History  . Marital status: Married    Spouse name: Not on file  . Number of children: 2  . Years of education: Not on file  . Highest education level: Not on file  Occupational History  . Occupation: retired  Scientific laboratory technician  . Financial resource strain: Not on file  . Food insecurity:    Worry: Not on file    Inability: Not on file  . Transportation needs:    Medical: Not on file    Non-medical: Not on file  Tobacco Use  . Smoking status: Former Research scientist (life sciences)  . Smokeless tobacco: Never Used  . Tobacco comment: Quit >30 yrs ago  Substance and Sexual Activity  . Alcohol use: Yes    Comment: 1 glass wine dialy  . Drug use: No  . Sexual activity: Never    Birth control/protection: None  Lifestyle  . Physical activity:    Days per week: Not on file      Minutes per session: Not on file  . Stress: Not on file  Relationships  . Social connections:    Talks on phone: Not on file    Gets together: Not on file    Attends religious service: Not on file    Active member of club or organization: Not on file    Attends meetings of clubs or organizations: Not on file    Relationship status: Not on file  . Intimate partner violence:    Fear of current or ex partner: Not on file    Emotionally abused: Not on file    Physically abused: Not on file    Forced sexual activity: Not on file  Other Topics Concern  . 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      Medication List        Accurate as of 04/21/17 11:59 PM. Always use your most recent med list.          CENTRUM SILVER PO Take 1 each by mouth daily.   clonazePAM 0.5 MG tablet Commonly known as:  KLONOPIN Take 1 tablet (0.5 mg total) by mouth 2 (two) times daily as needed for anxiety.   DULoxetine 60 MG capsule Commonly known as:  CYMBALTA Take 1 capsule (60 mg total) by mouth 2 (two) times daily.   Fish Oil 1200 MG Caps Take 1 capsule by mouth daily.   metoprolol tartrate 50 MG tablet Commonly known as:  LOPRESSOR Take 1 tablet (50 mg total) by  mouth 2 (two) times daily.   OSCAL 500/200 D-3 500-200 MG-UNIT tablet Generic drug:  calcium-vitamin D Take 1 tablet by mouth daily.   pantoprazole 40 MG tablet Commonly known as:  PROTONIX Take 1 tablet (40 mg total) by mouth daily.   Turmeric 500 MG Caps Take 2 capsules by mouth daily.          Objective:   Physical Exam BP 126/70 (BP Location: Left Arm, Patient Position: Sitting, Cuff Size: Small)   Pulse 80   Temp 97.6 F (36.4 C) (Oral)   Resp 14   Ht 5\' 2"  (1.575 m)   Wt 163 lb 8 oz (74.2 kg)   LMP 01/22/1992 (Within Months)   SpO2 96%   BMI 29.90 kg/m  General:   Well developed, well nourished . NAD.  HEENT:  Normocephalic . Face symmetric, atraumatic.  EOMI Skin: Not pale. Not jaundice Neurologic:  alert & oriented X3.  Speech normal, gait appropriate for age and unassisted.  Motor and DTRs symmetric Psych--  Cognition and judgment appear intact.  Cooperative with normal attention span and concentration.  Behavior appropriate. She is in good spirits     Assessment & Plan:    Assessment HTN Depression, Anxiety Tremor dx 11-2014 GERD with esophageal stricture L Breast cancer, 1997, lumpectomy, XRT, released from oncology  PLAN:  Depression and anxiety: Since the last visit, Cymbalta dose increased, was switch from Xanax to clonazepam, much improved, no apparent s/e.  Has not seen a counselor and does not think she needs one at this point.  UDS and contract today, continue same medications, call for RF. Tremors: Unchanged. GERD and dyspepsia: Much improved. Increase LFTs: See recent labs, at the time she was drinking EtOH excessively, then she moderate significantly on the LFTs  improved.  Recheck on RTC Unsteadiness, gait disorder: Symptoms of started about 2 years ago, had a head CT in 2016: No acute findings.  Neurological exam today symmetric.  I recommend [facility] at [facility].  She agrees.  We must prevent the next fall, see instructions.  Thinking  about a cane and I think that is a very good idea. RTC 4 months

## 2017-04-21 NOTE — Telephone Encounter (Signed)
Pt is requesting refill on clonazepam 0.5mg .   Last OV: 03/10/2017 Last Fill: 03/10/2017 #60 and 0rf UDS: None- will get at today's visit, as well as contract  [place] printed- no discrepancies noted- sent for scanning.   Please advise.

## 2017-04-21 NOTE — Patient Instructions (Addendum)
GO TO THE LAB : Provide a urine sample     GO TO THE FRONT DESK Schedule your next appointment for a checkup in 4 months   Call for refills as needed  Fall Prevention in the [place] can cause injuries and can affect people from all age groups. There are many simple things that you can do to make your home safe and to help prevent falls. What can I do on the outside of my home?  Regularly repair the edges of walkways and driveways and fix any cracks.  Remove high doorway thresholds.  Trim any shrubbery on the main path into your home.  Use bright outdoor lighting.  Clear walkways of debris and clutter, including tools and rocks.  Regularly check that handrails are securely fastened and in good repair. Both sides of any steps should have handrails.  Install guardrails along the edges of any raised decks or porches.  Have leaves, snow, and ice cleared regularly.  Use sand or salt on walkways during winter months.  In the garage, clean up any spills right away, including grease or oil spills. What can I do in the bathroom?  Use night lights.  Install grab bars by the toilet and in the tub and shower. Do not use towel bars as grab bars.  Use non-skid mats or decals on the floor of the tub or shower.  If you need to sit down while you are in the shower, use a plastic, non-slip stool.  Keep the floor dry. Immediately clean up any water that spills on the floor.  Remove soap buildup in the tub or shower on a regular basis.  Attach bath mats securely with double-sided non-slip rug tape.  Remove throw rugs and other tripping hazards from the floor. What can I do in the bedroom?  Use night lights.  Make sure that a bedside light is easy to reach.  Do not use oversized bedding that drapes onto the floor.  Have a firm chair that has side arms to use for getting dressed.  Remove throw rugs and other tripping hazards from the floor. What can I do in the  kitchen?  Clean up any spills right away.  Avoid walking on wet floors.  Place frequently used items in easy-to-reach places.  If you need to reach for something above you, use a sturdy step stool that has a grab bar.  Keep electrical cables out of the way.  Do not use floor polish or wax that makes floors slippery. If you have to use wax, make sure that it is non-skid floor wax.  Remove throw rugs and other tripping hazards from the floor. What can I do in the stairways?  Do not leave any items on the stairs.  Make sure that there are handrails on both sides of the stairs. Fix handrails that are broken or loose. Make sure that handrails are as long as the stairways.  Check any carpeting to make sure that it is firmly attached to the stairs. Fix any carpet that is loose or worn.  Avoid having throw rugs at the top or bottom of stairways, or secure the rugs with carpet tape to prevent them from moving.  Make sure that you have a light switch at the top of the stairs and the bottom of the stairs. If you do not have them, have them installed. What are some other fall prevention tips?  Wear closed-toe shoes that fit well and support your feet. Wear shoes  that have rubber soles or low heels.  When you use a stepladder, make sure that it is completely opened and that the sides are firmly locked. Have someone hold the ladder while you are using it. Do not climb a closed stepladder.  Add color or contrast paint or tape to grab bars and handrails in your home. Place contrasting color strips on the first and last steps.  Use mobility aids as needed, such as canes, walkers, scooters, and crutches.  Turn on lights if it is dark. Replace any light bulbs that burn out.  Set up furniture so that there are clear paths. Keep the furniture in the same spot.  Fix any uneven floor surfaces.  Choose a carpet design that does not hide the edge of steps of a stairway.  Be aware of any and all  pets.  Review your medicines with your healthcare provider. Some medicines can cause dizziness or changes in blood pressure, which increase your risk of falling. Talk with your health care provider about other ways that you can decrease your risk of falls. This may include working with a physical therapist or trainer to improve your strength, balance, and endurance. This information is not intended to replace advice given to you by your health care provider. Make sure you discuss any questions you have with your health care provider. Document Released: 12/28/2001 Document Revised: 06/06/2015 Document Reviewed: 02/11/2014 Elsevier Interactive Patient Education  [employer].

## 2017-04-22 NOTE — Assessment & Plan Note (Signed)
Depression and anxiety: Since the last visit, Cymbalta dose increased, was switch from Xanax to clonazepam, much improved, no apparent s/e.  Has not seen a counselor and does not think she needs one at this point.  UDS and contract today, continue same medications, call for RF. Tremors: Unchanged. GERD and dyspepsia: Much improved. Increase LFTs: See recent labs, at the time she was drinking EtOH excessively, then she moderate significantly on the LFTs  improved.  Recheck on RTC Unsteadiness, gait disorder: Symptoms of started about 2 years ago, had a head CT in 2016: No acute findings.  Neurological exam today symmetric.  I recommend PT at [facility].  She agrees.  We must prevent the next fall, see instructions.  Thinking about a cane and I think that is a very good idea. RTC 4 months

## 2017-04-25 LAB — PAIN MGMT, PROFILE 8 W/CONF, U
6 Acetylmorphine: NEGATIVE ng/mL (ref <10)
ALCOHOL METABOLITES: POSITIVE ng/mL — AB (ref <500)
ALPHAHYDROXYALPRAZOLAM: NEGATIVE ng/mL (ref <25)
AMPHETAMINES: NEGATIVE ng/mL (ref <500)
Alphahydroxymidazolam: NEGATIVE ng/mL (ref <50)
Alphahydroxytriazolam: NEGATIVE ng/mL (ref <50)
Aminoclonazepam: 140 ng/mL — ABNORMAL HIGH (ref <25)
BENZODIAZEPINES: POSITIVE ng/mL — AB (ref <100)
BUPRENORPHINE, URINE: NEGATIVE ng/mL (ref <5)
COCAINE METABOLITE: NEGATIVE ng/mL (ref <150)
CREATININE: 62.6 mg/dL
Ethyl Glucuronide (ETG): 57187 ng/mL — ABNORMAL HIGH (ref <500)
Ethyl Sulfate (ETS): 11829 ng/mL — ABNORMAL HIGH (ref <100)
Hydroxyethylflurazepam: NEGATIVE ng/mL (ref <50)
LORAZEPAM: NEGATIVE ng/mL (ref <50)
MDMA: NEGATIVE ng/mL (ref <500)
Marijuana Metabolite: NEGATIVE ng/mL (ref <20)
NORDIAZEPAM: NEGATIVE ng/mL (ref <50)
OPIATES: NEGATIVE ng/mL (ref <100)
OXIDANT: NEGATIVE ug/mL (ref <200)
Oxazepam: NEGATIVE ng/mL (ref <50)
Oxycodone: NEGATIVE ng/mL (ref <100)
PH: 7.34 (ref 4.5–9.0)
TEMAZEPAM: NEGATIVE ng/mL (ref <50)

## 2017-04-29 ENCOUNTER — Ambulatory Visit (INDEPENDENT_AMBULATORY_CARE_PROVIDER_SITE_OTHER): Payer: Medicare Other | Admitting: Physical Therapy

## 2017-04-29 ENCOUNTER — Encounter: Payer: Self-pay | Admitting: Physical Therapy

## 2017-04-29 DIAGNOSIS — R2689 Other abnormalities of gait and mobility: Secondary | ICD-10-CM

## 2017-04-30 NOTE — Therapy (Signed)
[facility] [address] [place], [place], [address] Phone: [phone]   Fax:  [phone]  Physical Therapy Evaluation  Patient Details  Name: Monique Ballard MRN: [id number] Date of Birth: [birth year] Referring Provider: Larose Kells   Encounter Date: 04/29/2017  PT End of Session - 04/30/17 1517    Visit Number  1    Number of Visits  12    Date for PT Re-Evaluation  06/10/17    Authorization Type  Medicare    PT Start Time  1430    PT Stop Time  1513    PT Time Calculation (min)  43 min    Equipment Utilized During Treatment  Gait belt    Activity Tolerance  Patient tolerated treatment well    Behavior During Therapy  [facility] for tasks assessed/performed       Past Medical History:  Diagnosis Date  . Breast cancer ([place]) fall 1997    Left Lumpectomy, XRT.  treated with Tamoxifem and Evista  . Depression   . Endometriosis in her 20's   surgery to help clear endometriosis to obtain a pregnancy.  Marland Kitchen GERD (gastroesophageal reflux disease)    w/"stretching" remotely  . HTN (hypertension)     Past Surgical History:  Procedure Laterality Date  . BREAST LUMPECTOMY Left fall 1997   Lymph nodes X 3 were negative, ER+, Radiation treatmetn.  Took Tamoxifem X 5 yrs then Evista for 3-5 yrs.  . CESAREAN SECTION     x2  first pregnancy breach and second normal  . PELVIC FRACTURE SURGERY     endometriosis, remotely    There were no vitals filed for this visit.   Subjective Assessment - 04/29/17 1434    Subjective  Pt reports several falls (6) in last 6 monts-36yr, states she just looses her balance. She does not use or have AD. Pt is retired, home during the day. She used to exercise and walk dogs, but states inability to do this recently, due to falls. Her daughter is a PT, but lives in [place].     Limitations  Walking;Standing;House hold activities    Patient Stated Goals  Decreased falls.     Currently in Pain?  No/denies          [facility] PT Assessment - 04/30/17 1524      Assessment   Medical Diagnosis  Balance/Gait    Referring Provider  Larose Kells    Next MD Visit  August    Prior Therapy  No      Precautions   Precautions  Fall      Balance Screen   Has the patient fallen in the past 6 months  Yes    How many times?  6    Has the patient had a decrease in activity level because of a fear of falling?   Yes    Is the patient reluctant to leave their home because of a fear of falling?   No      Prior Function   Level of Independence  Independent      Cognition   Overall Cognitive Status  Within Functional Limits for tasks assessed      ROM / Strength   AROM / PROM / Strength  AROM;Strength      AROM   Overall AROM Comments  LE ROM: WNL      Strength   Overall Strength Comments  LE strength: knees 4/5; hips 4-/5       Balance  Balance Assessed  Yes      Standardized Balance Assessment   Standardized Balance Assessment  Berg Balance Test      Berg Balance Test   Sit to Stand  Able to stand without using hands and stabilize independently    Standing Unsupported  Able to stand safely 2 minutes    Sitting with Back Unsupported but Feet Supported on Floor or Stool  Able to sit safely and securely 2 minutes    Stand to Sit  Controls descent by using hands    Transfers  Able to transfer safely, minor use of hands    Standing Unsupported with Eyes Closed  Able to stand 10 seconds safely    Standing Ubsupported with Feet Together  Needs help to attain position but able to stand for 30 seconds with feet together    From Standing, Reach Forward with Outstretched Arm  Can reach forward >12 cm safely (5")    From Standing Position, Pick up Object from Floor  Able to pick up shoe, needs supervision    From Standing Position, Turn to Look Behind Over each Shoulder  Looks behind one side only/other side shows less weight shift    Turn 360 Degrees  Able to turn 360 degrees safely one side only in 4 seconds or  less    Standing Unsupported, Alternately Place Feet on Step/Stool  Able to complete >2 steps/needs minimal assist    Standing Unsupported, One Foot in Front  Needs help to step but can hold 15 seconds    Standing on One Leg  Tries to lift leg/unable to hold 3 seconds but remains standing independently    Total Score  39      Functional Gait  Assessment   Gait assessed   Yes    Gait Level Surface  Walks 20 ft, slow speed, abnormal gait pattern, evidence for imbalance or deviates 10-15 in outside of the 12 in walkway width. Requires more than 7 sec to ambulate 20 ft.    Change in Gait Speed  Able to smoothly change walking speed without loss of balance or gait deviation. Deviate no more than 6 in outside of the 12 in walkway width.    Gait with Horizontal Head Turns  Performs head turns smoothly with slight change in gait velocity (eg, minor disruption to smooth gait path), deviates 6-10 in outside 12 in walkway width, or uses an assistive device.    Gait with Vertical Head Turns  Performs task with slight change in gait velocity (eg, minor disruption to smooth gait path), deviates 6 - 10 in outside 12 in walkway width or uses assistive device    Gait and Pivot Turn  Pivot turns safely in greater than 3 sec and stops with no loss of balance, or pivot turns safely within 3 sec and stops with mild imbalance, requires small steps to catch balance.    Step Over Obstacle  Is able to step over one shoe box (4.5 in total height) but must slow down and adjust steps to clear box safely. May require verbal cueing.    Gait with Narrow Base of Support  Ambulates less than 4 steps heel to toe or cannot perform without assistance.    Gait with Eyes Closed  Walks 20 ft, slow speed, abnormal gait pattern, evidence for imbalance, deviates 10-15 in outside 12 in walkway width. Requires more than 9 sec to ambulate 20 ft.    Ambulating Backwards  Walks 20 ft, uses assistive  device, slower speed, mild gait deviations,  deviates 6-10 in outside 12 in walkway width.    Steps  Alternating feet, must use rail.    Total Score  16                Objective measurements completed on examination: See above findings.              PT Education - 04/30/17 1516    Education provided  Yes    Education Details  Home safety, PT POC    Person(s) Educated  Patient    Methods  Explanation    Comprehension  Verbalized understanding;Need further instruction       PT Short Term Goals - 04/30/17 1522      PT SHORT TERM GOAL #1   Title  Pt to be independent with HEP for LE strength    Time  2    Period  Weeks    Status  New    Target Date  05/14/17        PT Long Term Goals - 04/30/17 1528      PT LONG TERM GOAL #1   Title  Pt to demo increased strength of B LE's to be at least 4+/5 to improve stability    Time  6    Period  Weeks    Status  New    Target Date  06/10/17      PT LONG TERM GOAL #2   Title  Pt to demo increase in score of BERG by at least 5 points     Time  6    Period  Weeks    Status  New    Target Date  06/10/17      PT LONG TERM GOAL #3   Title  Pt to demo improved score on FGA by at least 5 points     Time  6    Period  Weeks    Status  New    Target Date  06/10/17      PT LONG TERM GOAL #4   Title  Pt to demo ability for ambulation on indoor and outdoor surfaces, with balance to be WFL (good) , to decrease fall risk with community navigation    Time  6    Period  Weeks    Status  New    Target Date  06/10/17             Plan - 04/30/17 1518    Clinical Impression Statement  Pt presents wtih primary complaint of decreased balance and increased falls. She has had several falls in last 6 mo-1 year, and continues to be a fall risk. Pt with decreased gait mechanics, with decreased step height, and would likely benefit from [facility] for outdoor use. Pt with balance deficits seen with testing today, and will benefit from practice with static, and dynamic  balance training. She also has mild strength deficits in LEs, that will be addressed. Pt will benefit from skilled PT to improve balance and decrease fall risk, and to improve indpendence with community navigation.     Clinical Presentation  Evolving    Clinical Decision Making  Moderate    Rehab Potential  Good    PT Frequency  2x / week    PT Duration  6 weeks    PT Treatment/Interventions  ADLs/Self Care Home Management;Cryotherapy;Electrical Stimulation;Iontophoresis 4mg /ml Dexamethasone;Functional mobility training;Stair training;Gait training;Ultrasound;Moist Heat;Therapeutic activities;Therapeutic exercise;Balance training;Neuromuscular re-education;Patient/family education;Orthotic Fit/Training;Passive range of motion;Dry needling;Manual  techniques    Consulted and Agree with Plan of Care  Patient       Patient will benefit from skilled therapeutic intervention in order to improve the following deficits and impairments:  Abnormal gait, Decreased endurance, Decreased activity tolerance, Decreased strength, Pain, Decreased balance, Decreased mobility, Difficulty walking, Improper body mechanics, Impaired perceived functional ability, Decreased coordination, Decreased safety awareness  Visit Diagnosis: Other abnormalities of gait and mobility     Problem List Patient Active Problem List   Diagnosis Date Noted  . Tremor 02/24/2015  . Gait disorder 02/24/2015  . PCP NOTES >>>>>>> 12/13/2014  . Abdominal wall strain 03/19/2012  . Osteopenia 10/04/2011  . Annual physical exam 09/07/2010  . GERD 08/28/2009  . Anxiety and depression 10/06/2006  . Essential hypertension 10/06/2006  . BREAST CANCER, HX OF 10/06/2006   Lyndee Hensen, PT, DPT 3:32 PM  04/30/17    [facility] [place], [place], [address] Phone: [phone]   Fax:  [phone]  Name: Monique Ballard MRN: [id number] Date of Birth: [birth year]

## 2017-05-01 ENCOUNTER — Ambulatory Visit (INDEPENDENT_AMBULATORY_CARE_PROVIDER_SITE_OTHER): Payer: Medicare Other | Admitting: Physical Therapy

## 2017-05-01 ENCOUNTER — Encounter: Payer: Self-pay | Admitting: Physical Therapy

## 2017-05-01 DIAGNOSIS — R2689 Other abnormalities of gait and mobility: Secondary | ICD-10-CM

## 2017-05-01 NOTE — Therapy (Addendum)
[facility] [address] [place], [place], [address] Phone: [phone]   Fax:  [phone]  Physical Therapy Treatment  Patient Details  Name: Monique Ballard MRN: [id number] Date of Birth: September 05, 1940 Referring Provider: Larose Kells   Encounter Date: 05/01/2017  PT End of Session - 05/01/17 1139    Visit Number  2    Number of Visits  12    Date for PT Re-Evaluation  06/10/17    Authorization Type  Medicare    PT Start Time  1134    PT Stop Time  1217    PT Time Calculation (min)  43 min    Equipment Utilized During Treatment  Gait belt    Activity Tolerance  Patient tolerated treatment well    Behavior During Therapy  [facility] for tasks assessed/performed       Past Medical History:  Diagnosis Date  . Breast cancer ([place]) fall 1997    Left Lumpectomy, XRT.  treated with Tamoxifem and Evista  . Depression   . Endometriosis in her 20's   surgery to help clear endometriosis to obtain a pregnancy.  Marland Kitchen GERD (gastroesophageal reflux disease)    w/"stretching" remotely  . HTN (hypertension)     Past Surgical History:  Procedure Laterality Date  . BREAST LUMPECTOMY Left fall 1997   Lymph nodes X 3 were negative, ER+, Radiation treatmetn.  Took Tamoxifem X 5 yrs then Evista for 3-5 yrs.  . CESAREAN SECTION     x2  first pregnancy breach and second normal  . PELVIC FRACTURE SURGERY     endometriosis, remotely    There were no vitals filed for this visit.  Subjective Assessment - 05/01/17 1138    Subjective  Pt with no new complaints today, no falls.     Currently in Pain?  No/denies         [facility] PT Assessment - 04/30/17 1524      Assessment   Medical Diagnosis  Balance/Gait    Referring Provider  Larose Kells    Next MD Visit  August    Prior Therapy  No      Precautions   Precautions  Fall      Balance Screen   Has the patient fallen in the past 6 months  Yes    How many times?  6    Has the patient had a decrease in activity  level because of a fear of falling?   Yes    Is the patient reluctant to leave their home because of a fear of falling?   No      Prior Function   Level of Independence  Independent      Cognition   Overall Cognitive Status  Within Functional Limits for tasks assessed      ROM / Strength   AROM / PROM / Strength  AROM;Strength      AROM   Overall AROM Comments  LE ROM: WNL      Strength   Overall Strength Comments  LE strength: knees 4/5; hips 4-/5       Balance   Balance Assessed  Yes      Standardized Balance Assessment   Standardized Balance Assessment  Berg Balance Test      Berg Balance Test   Sit to Stand  Able to stand without using hands and stabilize independently    Standing Unsupported  Able to stand safely 2 minutes    Sitting with Back Unsupported but Feet  Supported on Floor or Stool  Able to sit safely and securely 2 minutes    Stand to Sit  Controls descent by using hands    Transfers  Able to transfer safely, minor use of hands    Standing Unsupported with Eyes Closed  Able to stand 10 seconds safely    Standing Ubsupported with Feet Together  Needs help to attain position but able to stand for 30 seconds with feet together    From Standing, Reach Forward with Outstretched Arm  Can reach forward >12 cm safely (5")    From Standing Position, Pick up Object from Floor  Able to pick up shoe, needs supervision    From Standing Position, Turn to Look Behind Over each Shoulder  Looks behind one side only/other side shows less weight shift    Turn 360 Degrees  Able to turn 360 degrees safely one side only in 4 seconds or less    Standing Unsupported, Alternately Place Feet on Step/Stool  Able to complete >2 steps/needs minimal assist    Standing Unsupported, One Foot in Front  Needs help to step but can hold 15 seconds    Standing on One Leg  Tries to lift leg/unable to hold 3 seconds but remains standing independently    Total Score  39      Functional Gait   Assessment   Gait assessed   Yes    Gait Level Surface  Walks 20 ft, slow speed, abnormal gait pattern, evidence for imbalance or deviates 10-15 in outside of the 12 in walkway width. Requires more than 7 sec to ambulate 20 ft.    Change in Gait Speed  Able to smoothly change walking speed without loss of balance or gait deviation. Deviate no more than 6 in outside of the 12 in walkway width.    Gait with Horizontal Head Turns  Performs head turns smoothly with slight change in gait velocity (eg, minor disruption to smooth gait path), deviates 6-10 in outside 12 in walkway width, or uses an assistive device.    Gait with Vertical Head Turns  Performs task with slight change in gait velocity (eg, minor disruption to smooth gait path), deviates 6 - 10 in outside 12 in walkway width or uses assistive device    Gait and Pivot Turn  Pivot turns safely in greater than 3 sec and stops with no loss of balance, or pivot turns safely within 3 sec and stops with mild imbalance, requires small steps to catch balance.    Step Over Obstacle  Is able to step over one shoe box (4.5 in total height) but must slow down and adjust steps to clear box safely. May require verbal cueing.    Gait with Narrow Base of Support  Ambulates less than 4 steps heel to toe or cannot perform without assistance.    Gait with Eyes Closed  Walks 20 ft, slow speed, abnormal gait pattern, evidence for imbalance, deviates 10-15 in outside 12 in walkway width. Requires more than 9 sec to ambulate 20 ft.    Ambulating Backwards  Walks 20 ft, uses assistive device, slower speed, mild gait deviations, deviates 6-10 in outside 12 in walkway width.    Steps  Alternating feet, must use rail.    Total Score  16                   [facility] Adult PT Treatment/Exercise - 05/01/17 0001      Exercises   Exercises  Knee/Hip      Knee/Hip Exercises: Aerobic   Stationary Bike  L1 x5 min      Knee/Hip Exercises: Standing   Hip Flexion  20  reps;Knee bent    Hip Abduction  2 sets;10 reps;Both    Other Standing Knee Exercises  Toe taps on 6 in step, minimal hand hold x20 bil;  Step up 6 in step x10 bil;     Other Standing Knee Exercises  Weight shifts L/R and A/P x25 each;  1/2 tandem stance 30 sec x3 bil; Lateral step with weight shift x10 bil;       Knee/Hip Exercises: Seated   Long Arc Quad  2 sets;10 reps;Both    Clamshell with TheraBand  Green x20    Marching  2 sets;10 reps;Both    Sit to [employer]  2 sets;5 reps             PT Education - 05/01/17 1139    Education provided  Yes    Education Details  LE strengthening/HEP    Person(s) Educated  Patient    Methods  Explanation    Comprehension  Verbalized understanding       PT Short Term Goals - 04/30/17 1522      PT SHORT TERM GOAL #1   Title  Pt to be independent with HEP for LE strength    Time  2    Period  Weeks    Status  New    Target Date  05/14/17        PT Long Term Goals - 04/30/17 1528      PT LONG TERM GOAL #1   Title  Pt to demo increased strength of B LE's to be at least 4+/5 to improve stability    Time  6    Period  Weeks    Status  New    Target Date  06/10/17      PT LONG TERM GOAL #2   Title  Pt to demo increase in score of BERG by at least 5 points     Time  6    Period  Weeks    Status  New    Target Date  06/10/17      PT LONG TERM GOAL #3   Title  Pt to demo improved score on FGA by at least 5 points     Time  6    Period  Weeks    Status  New    Target Date  06/10/17      PT LONG TERM GOAL #4   Title  Pt to demo ability for ambulation on indoor and outdoor surfaces, with balance to be WFL (good) , to decrease fall risk with community navigation    Time  6    Period  Weeks    Status  New    Target Date  06/10/17            Plan - 05/01/17 1444    Clinical Impression Statement  Pt educated on LE ther ex today for HEP. Pt started on balance exercises today, focusing on weight shifting and COG. Pt with  difficulty clearing foot on stairs, and difficulty with posterior weight shift. Plan to progress balance and stability, and gait exercises as able.     PT Treatment/Interventions  ADLs/Self Care Home Management;Cryotherapy;Electrical Stimulation;Iontophoresis 4mg /ml Dexamethasone;Functional mobility training;Stair training;Gait training;Ultrasound;Moist Heat;Therapeutic activities;Therapeutic exercise;Balance training;Neuromuscular re-education;Patient/family education;Orthotic Fit/Training;Passive range of motion;Dry needling;Manual techniques  Patient will benefit from skilled therapeutic intervention in order to improve the following deficits and impairments:  Abnormal gait, Decreased endurance, Decreased activity tolerance, Decreased strength, Pain, Decreased balance, Decreased mobility, Difficulty walking, Improper body mechanics, Impaired perceived functional ability, Decreased coordination, Decreased safety awareness  Visit Diagnosis: Other abnormalities of gait and mobility     Problem List Patient Active Problem List   Diagnosis Date Noted  . Tremor 02/24/2015  . Gait disorder 02/24/2015  . PCP NOTES >>>>>>> 12/13/2014  . Abdominal wall strain 03/19/2012  . Osteopenia 10/04/2011  . Annual physical exam 09/07/2010  . GERD 08/28/2009  . Anxiety and depression 10/06/2006  . Essential hypertension 10/06/2006  . BREAST CANCER, HX OF 10/06/2006   Lyndee Hensen, PT, DPT 3:03 PM  05/01/17    [facility] [place], [place], [address] Phone: [phone]   Fax:  [phone]  Name: Monique Ballard MRN: [id number] Date of Birth: 06/22/1940

## 2017-05-06 ENCOUNTER — Ambulatory Visit (INDEPENDENT_AMBULATORY_CARE_PROVIDER_SITE_OTHER): Payer: Medicare Other | Admitting: Physical Therapy

## 2017-05-06 ENCOUNTER — Encounter: Payer: Self-pay | Admitting: Physical Therapy

## 2017-05-06 DIAGNOSIS — R2689 Other abnormalities of gait and mobility: Secondary | ICD-10-CM | POA: Diagnosis not present

## 2017-05-06 NOTE — Therapy (Signed)
[facility] [address] [place], [place], [address] Phone: [phone]   Fax:  [phone]  Physical Therapy Treatment  Patient Details  Name: Monique Ballard MRN: [id number] Date of Birth: 01/24/1940 Referring Provider: Larose Kells   Encounter Date: 05/06/2017  PT End of Session - 05/06/17 1516    Visit Number  3    Number of Visits  12    Date for PT Re-Evaluation  06/10/17    Authorization Type  Medicare    PT Start Time  1430    PT Stop Time  1515    PT Time Calculation (min)  45 min    Equipment Utilized During Treatment  Gait belt    Activity Tolerance  Patient tolerated treatment well    Behavior During Therapy  [facility] for tasks assessed/performed       Past Medical History:  Diagnosis Date  . Breast cancer ([place]) fall 1997    Left Lumpectomy, XRT.  treated with Tamoxifem and Evista  . Depression   . Endometriosis in her 20's   surgery to help clear endometriosis to obtain a pregnancy.  Marland Kitchen GERD (gastroesophageal reflux disease)    w/"stretching" remotely  . HTN (hypertension)     Past Surgical History:  Procedure Laterality Date  . BREAST LUMPECTOMY Left fall 1997   Lymph nodes X 3 were negative, ER+, Radiation treatmetn.  Took Tamoxifem X 5 yrs then Evista for 3-5 yrs.  . CESAREAN SECTION     x2  first pregnancy breach and second normal  . PELVIC FRACTURE SURGERY     endometriosis, remotely    There were no vitals filed for this visit.  Subjective Assessment - 05/06/17 1516    Subjective  Pt with no new complaints, has been doing HEP.                        [facility] Adult PT Treatment/Exercise - 05/06/17 1507      Exercises   Exercises  Knee/Hip      Knee/Hip Exercises: Aerobic   Stationary Bike  L1 x6 min      Knee/Hip Exercises: Standing   Hip Flexion  20 reps;Knee bent    Hip Abduction  2 sets;10 reps;Both    Other Standing Knee Exercises  Toe taps on 6 in step, minimal hand hold x20 bil;   walk/march 10 ft x6;     Other Standing Knee Exercises  Weight shifts L/R and A/P x25 each on AirEx;   tandem stance 30 sec x3 bil with head turns and UE flexion; Lateral step with weight shift x10 bil; Tandem walk 10 ft x6 minimal hand hold      Knee/Hip Exercises: Seated   Long Arc Quad  --    Clamshell with TheraBand  Green x20    Marching  --    Sit to [employer]             PT Education - 05/06/17 1516    Education provided  Yes    Education Details  Balance/safety     Person(s) Educated  Patient    Methods  Explanation    Comprehension  Verbalized understanding       PT Short Term Goals - 04/30/17 1522      PT SHORT TERM GOAL #1   Title  Pt to be independent with HEP for LE strength    Time  2    Period  Weeks  Status  New    Target Date  05/14/17        PT Long Term Goals - 04/30/17 1528      PT LONG TERM GOAL #1   Title  Pt to demo increased strength of B LE's to be at least 4+/5 to improve stability    Time  6    Period  Weeks    Status  New    Target Date  06/10/17      PT LONG TERM GOAL #2   Title  Pt to demo increase in score of BERG by at least 5 points     Time  6    Period  Weeks    Status  New    Target Date  06/10/17      PT LONG TERM GOAL #3   Title  Pt to demo improved score on FGA by at least 5 points     Time  6    Period  Weeks    Status  New    Target Date  06/10/17      PT LONG TERM GOAL #4   Title  Pt to demo ability for ambulation on indoor and outdoor surfaces, with balance to be WFL (good) , to decrease fall risk with community navigation    Time  6    Period  Weeks    Status  New    Target Date  06/10/17            Plan - 05/06/17 1529    Clinical Impression Statement  She with increased sway and difficulty with SLS, tandem stance, and tandem walking. Pt challenged with all activities, but has no apparent LOB. She is mildly more fatigued today than previous sessions, and requires more seated rest breaks. Pt  states that she did not eat lunch, discussed importance of nutrition.     PT Treatment/Interventions  ADLs/Self Care Home Management;Cryotherapy;Electrical Stimulation;Iontophoresis 4mg /ml Dexamethasone;Functional mobility training;Stair training;Gait training;Ultrasound;Moist Heat;Therapeutic activities;Therapeutic exercise;Balance training;Neuromuscular re-education;Patient/family education;Orthotic Fit/Training;Passive range of motion;Dry needling;Manual techniques       Patient will benefit from skilled therapeutic intervention in order to improve the following deficits and impairments:  Abnormal gait, Decreased endurance, Decreased activity tolerance, Decreased strength, Pain, Decreased balance, Decreased mobility, Difficulty walking, Improper body mechanics, Impaired perceived functional ability, Decreased coordination, Decreased safety awareness  Visit Diagnosis: Other abnormalities of gait and mobility     Problem List Patient Active Problem List   Diagnosis Date Noted  . Tremor 02/24/2015  . Gait disorder 02/24/2015  . PCP NOTES >>>>>>> 12/13/2014  . Abdominal wall strain 03/19/2012  . Osteopenia 10/04/2011  . Annual physical exam 09/07/2010  . GERD 08/28/2009  . Anxiety and depression 10/06/2006  . Essential hypertension 10/06/2006  . BREAST CANCER, HX OF 10/06/2006   Lyndee Hensen, PT, DPT 3:32 PM  05/06/17    [facility] [place], [place], [address] Phone: [phone]   Fax:  [phone]  Name: Monique Ballard MRN: [id number] Date of Birth: 02/15/1940

## 2017-05-08 ENCOUNTER — Ambulatory Visit (INDEPENDENT_AMBULATORY_CARE_PROVIDER_SITE_OTHER): Payer: Medicare Other | Admitting: Physical Therapy

## 2017-05-08 DIAGNOSIS — R2689 Other abnormalities of gait and mobility: Secondary | ICD-10-CM | POA: Diagnosis not present

## 2017-05-08 NOTE — Therapy (Signed)
[facility] [address] [place], [place], [address] Phone: [phone]   Fax:  [phone]  Physical Therapy Treatment  Patient Details  Name: Monique Ballard MRN: [id number] Date of Birth: 25-Sep-1940 Referring Provider: Larose Kells   Encounter Date: 05/08/2017  PT End of Session - 05/08/17 1146    Visit Number  4    Number of Visits  12    Date for PT Re-Evaluation  06/10/17    Authorization Type  Medicare    PT Start Time  1139    PT Stop Time  1225    PT Time Calculation (min)  46 min    Equipment Utilized During Treatment  Gait belt    Activity Tolerance  Patient tolerated treatment well    Behavior During Therapy  [facility] for tasks assessed/performed       Past Medical History:  Diagnosis Date  . Breast cancer ([place]) fall 1997    Left Lumpectomy, XRT.  treated with Tamoxifem and Evista  . Depression   . Endometriosis in her 20's   surgery to help clear endometriosis to obtain a pregnancy.  Marland Kitchen GERD (gastroesophageal reflux disease)    w/"stretching" remotely  . HTN (hypertension)     Past Surgical History:  Procedure Laterality Date  . BREAST LUMPECTOMY Left fall 1997   Lymph nodes X 3 were negative, ER+, Radiation treatmetn.  Took Tamoxifem X 5 yrs then Evista for 3-5 yrs.  . CESAREAN SECTION     x2  first pregnancy breach and second normal  . PELVIC FRACTURE SURGERY     endometriosis, remotely    There were no vitals filed for this visit.  Subjective Assessment - 05/08/17 1553    Subjective  Pt with no new complaints, has been doing HEP.                        [facility] Adult PT Treatment/Exercise - 05/08/17 1153      Exercises   Exercises  Knee/Hip      Knee/Hip Exercises: Aerobic   Stationary Bike  --      Knee/Hip Exercises: Standing   Heel Raises  10 reps    Hip Flexion  20 reps;Knee bent    Hip Abduction  2 sets;10 reps;Both    Lateral Step Up  20 reps    Lateral Step Up Limitations  AirEx     Stairs  up/down 5 steps x5 with 1 handrail    Gait Training  150 ft outdoors on unevn surface/grass;  Up/down curb x4 no UE support;     Other Standing Knee Exercises  Toe taps on 6 in step, minimal hand hold x20 bil;      Other Standing Knee Exercises  Weight shifts L/R and A/P x25 each on AirEx;   tandem stance 30 sec x3 bil with head turns and UE flexion; l      Knee/Hip Exercises: Seated   Long Arc Quad  2 sets;10 reps;Both    Long Arc Quad Weight  2 lbs.    Clamshell with TheraBand  -- x20    Marching  2 sets;10 reps;Both    Marching Limitations  1.5 lb    Sit to [employer]             PT Education - 05/08/17 1553    Education provided  Yes    Education Details  HEP    Person(s) Educated  Patient  Methods  Explanation    Comprehension  Verbalized understanding       PT Short Term Goals - 04/30/17 1522      PT SHORT TERM GOAL #1   Title  Pt to be independent with HEP for LE strength    Time  2    Period  Weeks    Status  New    Target Date  05/14/17        PT Long Term Goals - 04/30/17 1528      PT LONG TERM GOAL #1   Title  Pt to demo increased strength of B LE's to be at least 4+/5 to improve stability    Time  6    Period  Weeks    Status  New    Target Date  06/10/17      PT LONG TERM GOAL #2   Title  Pt to demo increase in score of BERG by at least 5 points     Time  6    Period  Weeks    Status  New    Target Date  06/10/17      PT LONG TERM GOAL #3   Title  Pt to demo improved score on FGA by at least 5 points     Time  6    Period  Weeks    Status  New    Target Date  06/10/17      PT LONG TERM GOAL #4   Title  Pt to demo ability for ambulation on indoor and outdoor surfaces, with balance to be WFL (good) , to decrease fall risk with community navigation    Time  6    Period  Weeks    Status  New    Target Date  06/10/17            Plan - 05/08/17 1554    Clinical Impression Statement  Pt challenged with outdoor  ambulation, but no LOB. She has decreased stability and confidence with all outdoor activities, cued for use of vision and slow speed. Pt improving mildly with ability for Tandem stance,but still challenging. She requires cuing on stairs, to clear foot each time, has tendency to catch foot on way up and down. Plan to continue balance progression as tolerated.     PT Treatment/Interventions  ADLs/Self Care Home Management;Cryotherapy;Electrical Stimulation;Iontophoresis 4mg /ml Dexamethasone;Functional mobility training;Stair training;Gait training;Ultrasound;Moist Heat;Therapeutic activities;Therapeutic exercise;Balance training;Neuromuscular re-education;Patient/family education;Orthotic Fit/Training;Passive range of motion;Dry needling;Manual techniques       Patient will benefit from skilled therapeutic intervention in order to improve the following deficits and impairments:  Abnormal gait, Decreased endurance, Decreased activity tolerance, Decreased strength, Pain, Decreased balance, Decreased mobility, Difficulty walking, Improper body mechanics, Impaired perceived functional ability, Decreased coordination, Decreased safety awareness  Visit Diagnosis: Other abnormalities of gait and mobility     Problem List Patient Active Problem List   Diagnosis Date Noted  . Tremor 02/24/2015  . Gait disorder 02/24/2015  . PCP NOTES >>>>>>> 12/13/2014  . Abdominal wall strain 03/19/2012  . Osteopenia 10/04/2011  . Annual physical exam 09/07/2010  . GERD 08/28/2009  . Anxiety and depression 10/06/2006  . Essential hypertension 10/06/2006  . BREAST CANCER, HX OF 10/06/2006   Lyndee Hensen, PT, DPT 3:57 PM  05/08/17    [facility] [place], [place], [address] Phone: [phone]   Fax:  [phone]  Name: Sherrye Puga MRN: [id number] Date of Birth: 1940/02/08

## 2017-05-13 ENCOUNTER — Ambulatory Visit (INDEPENDENT_AMBULATORY_CARE_PROVIDER_SITE_OTHER): Payer: Medicare Other | Admitting: Physical Therapy

## 2017-05-13 ENCOUNTER — Encounter: Payer: Self-pay | Admitting: Physical Therapy

## 2017-05-13 DIAGNOSIS — R2689 Other abnormalities of gait and mobility: Secondary | ICD-10-CM | POA: Diagnosis not present

## 2017-05-13 NOTE — Therapy (Signed)
[facility] [address] [place], [place], [address] Phone: [phone]   Fax:  [phone]  Physical Therapy Treatment  Patient Details  Name: Monique Ballard MRN: [id number] Date of Birth: 12/20/1940 Referring Provider: Larose Kells   Encounter Date: 05/13/2017  PT End of Session - 05/13/17 1526    Visit Number  5    Number of Visits  12    Date for PT Re-Evaluation  06/10/17    Authorization Type  Medicare    PT Start Time  1433    PT Stop Time  1521    PT Time Calculation (min)  48 min    Equipment Utilized During Treatment  Gait belt    Activity Tolerance  Patient tolerated treatment well    Behavior During Therapy  [facility] for tasks assessed/performed       Past Medical History:  Diagnosis Date  . Breast cancer ([place]) fall 1997    Left Lumpectomy, XRT.  treated with Tamoxifem and Evista  . Depression   . Endometriosis in her 20's   surgery to help clear endometriosis to obtain a pregnancy.  Marland Kitchen GERD (gastroesophageal reflux disease)    w/"stretching" remotely  . HTN (hypertension)     Past Surgical History:  Procedure Laterality Date  . BREAST LUMPECTOMY Left fall 1997   Lymph nodes X 3 were negative, ER+, Radiation treatmetn.  Took Tamoxifem X 5 yrs then Evista for 3-5 yrs.  . CESAREAN SECTION     x2  first pregnancy breach and second normal  . PELVIC FRACTURE SURGERY     endometriosis, remotely    There were no vitals filed for this visit.  Subjective Assessment - 05/13/17 1525    Subjective  Pt states that she fell on Sunday, she was outside picking a flower of bush, and fell as she was turning around. She was able to crawl over to stairs, husband helped her up, no injury. States she "just lost her balance"                        [facility] Adult PT Treatment/Exercise - 05/13/17 1442      Exercises   Exercises  Knee/Hip      Knee/Hip Exercises: Standing   Heel Raises  10 reps    Hip Flexion  20 reps;Knee  bent    Hip Abduction  2 sets;10 reps;Both    Lateral Step Up  20 reps    Lateral Step Up Limitations  AirEx    Stairs  --    Gait Training  35 ft x4 BWD walking; 35 ft x6 with verbally directed direction changes; 250 ft outdoors on unevn surface/grass with SPC;  Up/down curb x4  with SPC;     Other Standing Knee Exercises  Toe taps on 6 in step, minimal hand hold x20 bil;      Other Standing Knee Exercises  Weight shifts L/R and A/P x25 each on AirEx;   tandem stance 30 sec x3 bil with head turns and UE flexion; SLS 20 sec x2 bil;       Knee/Hip Exercises: Seated   Long Arc Quad  --    Long Arc [employer]  --    Clamshell with [employer]  --    Marching  --    Marching Limitations  --    Sit to [employer]  --             PT Education -  05/13/17 1526    Education provided  Yes    Education Details  Use of SPC,    Person(s) Educated  Patient    Methods  Explanation    Comprehension  Verbalized understanding       PT Short Term Goals - 04/30/17 1522      PT SHORT TERM GOAL #1   Title  Pt to be independent with HEP for LE strength    Time  2    Period  Weeks    Status  New    Target Date  05/14/17        PT Long Term Goals - 04/30/17 1528      PT LONG TERM GOAL #1   Title  Pt to demo increased strength of B LE's to be at least 4+/5 to improve stability    Time  6    Period  Weeks    Status  New    Target Date  06/10/17      PT LONG TERM GOAL #2   Title  Pt to demo increase in score of BERG by at least 5 points     Time  6    Period  Weeks    Status  New    Target Date  06/10/17      PT LONG TERM GOAL #3   Title  Pt to demo improved score on FGA by at least 5 points     Time  6    Period  Weeks    Status  New    Target Date  06/10/17      PT LONG TERM GOAL #4   Title  Pt to demo ability for ambulation on indoor and outdoor surfaces, with balance to be WFL (good) , to decrease fall risk with community navigation    Time  6    Period  Weeks    Status  New     Target Date  06/10/17            Plan - 05/13/17 1528    Clinical Impression Statement  Pt educated on use and sequencing with her own [facility] today. Recommended that she use when she is going out of the house. Pt with increased difficulty ambulating on unstable/outdoor surface today, she had therapist corrected, small LOB x2 with this today. She is improving with other static balance exercises, with improved sway and improved hold time. Pt to benefit form continued care. Pt referred to Sports med MD due to complaints of R shoulder pain.      Rehab Potential  Good    PT Frequency  2x / week    PT Duration  6 weeks    PT Treatment/Interventions  ADLs/Self Care Home Management;Cryotherapy;Electrical Stimulation;Iontophoresis 4mg /ml Dexamethasone;Functional mobility training;Stair training;Gait training;Ultrasound;Moist Heat;Therapeutic activities;Therapeutic exercise;Balance training;Neuromuscular re-education;Patient/family education;Orthotic Fit/Training;Passive range of motion;Dry needling;Manual techniques    Consulted and Agree with Plan of Care  Patient       Patient will benefit from skilled therapeutic intervention in order to improve the following deficits and impairments:  Abnormal gait, Decreased endurance, Decreased activity tolerance, Decreased strength, Pain, Decreased balance, Decreased mobility, Difficulty walking, Improper body mechanics, Impaired perceived functional ability, Decreased coordination, Decreased safety awareness  Visit Diagnosis: Other abnormalities of gait and mobility     Problem List Patient Active Problem List   Diagnosis Date Noted  . Tremor 02/24/2015  . Gait disorder 02/24/2015  . PCP NOTES >>>>>>> 12/13/2014  . Abdominal wall strain 03/19/2012  .  Osteopenia 10/04/2011  . Annual physical exam 09/07/2010  . GERD 08/28/2009  . Anxiety and depression 10/06/2006  . Essential hypertension 10/06/2006  . BREAST CANCER, HX OF 10/06/2006   Lyndee Hensen, PT, DPT 3:32 PM  05/13/17    [place], [place], [address] Phone: [phone]   Fax:  [phone]  Name: Monique Ballard MRN: [id number] Date of Birth: Nov 04, 1940

## 2017-05-15 ENCOUNTER — Encounter: Payer: Self-pay | Admitting: Physical Therapy

## 2017-05-15 ENCOUNTER — Ambulatory Visit (INDEPENDENT_AMBULATORY_CARE_PROVIDER_SITE_OTHER): Payer: Medicare Other | Admitting: Physical Therapy

## 2017-05-15 DIAGNOSIS — R2689 Other abnormalities of gait and mobility: Secondary | ICD-10-CM

## 2017-05-15 NOTE — Therapy (Signed)
[facility] [address] [place], [place], [address] Phone: [phone]   Fax:  [phone]  Physical Therapy Treatment  Patient Details  Name: Monique Ballard MRN: [id number] Date of Birth: 1940-09-23 Referring Provider: Larose Kells   Encounter Date: 05/15/2017  PT End of Session - 05/15/17 1608    Visit Number  6    Number of Visits  12    Date for PT Re-Evaluation  06/10/17    Authorization Type  Medicare    PT Start Time  1430    PT Stop Time  4166    PT Time Calculation (min)  44 min    Equipment Utilized During Treatment  Gait belt    Activity Tolerance  Patient tolerated treatment well    Behavior During Therapy  [facility] for tasks assessed/performed       Past Medical History:  Diagnosis Date  . Breast cancer ([place]) fall 1997    Left Lumpectomy, XRT.  treated with Tamoxifem and Evista  . Depression   . Endometriosis in her 20's   surgery to help clear endometriosis to obtain a pregnancy.  Marland Kitchen GERD (gastroesophageal reflux disease)    w/"stretching" remotely  . HTN (hypertension)     Past Surgical History:  Procedure Laterality Date  . BREAST LUMPECTOMY Left fall 1997   Lymph nodes X 3 were negative, ER+, Radiation treatmetn.  Took Tamoxifem X 5 yrs then Evista for 3-5 yrs.  . CESAREAN SECTION     x2  first pregnancy breach and second normal  . PELVIC FRACTURE SURGERY     endometriosis, remotely    There were no vitals filed for this visit.  Subjective Assessment - 05/15/17 1608    Subjective  Pt states she is tired today, didnt sleep well last night.     Currently in Pain?  No/denies                       [facility] Adult PT Treatment/Exercise - 05/15/17 1429      Exercises   Exercises  Knee/Hip      Knee/Hip Exercises: Aerobic   Stationary Bike  L1 x7 min      Knee/Hip Exercises: Standing   Heel Raises  --    Hip Flexion  20 reps;Knee bent    Hip Abduction  --    Lateral Step Up  10 reps;Both    Lateral Step Up Limitations  --    Forward Step Up  10 reps;Both    Stairs  up/down 5 steps x 6 with 1 hand rail    Gait Training  35 ft x6 stepping up/over cones; 250 ft outdoors on unevn surface/grass with SPC;  Up/down curb x4  with SPC;     Other Standing Knee Exercises  education and practice for getting up from floor/kneeling position x1    Other Standing Knee Exercises  Weight shifts A/P x25 each on AirEx;   tandem stance 30 sec x3 bil with head turns ; rhomberg stance with UE and trunk rotation x10; Reaching outside BOS all levels x8 bil;        Knee/Hip Exercises: Seated   Sit to Sand  15 reps             PT Education - 05/15/17 1608    Education provided  Yes    Education Details  Use of SPC     Person(s) Educated  Patient    Methods  Explanation  Comprehension  Verbalized understanding       PT Short Term Goals - 04/30/17 1522      PT SHORT TERM GOAL #1   Title  Pt to be independent with HEP for LE strength    Time  2    Period  Weeks    Status  New    Target Date  05/14/17        PT Long Term Goals - 04/30/17 1528      PT LONG TERM GOAL #1   Title  Pt to demo increased strength of B LE's to be at least 4+/5 to improve stability    Time  6    Period  Weeks    Status  New    Target Date  06/10/17      PT LONG TERM GOAL #2   Title  Pt to demo increase in score of BERG by at least 5 points     Time  6    Period  Weeks    Status  New    Target Date  06/10/17      PT LONG TERM GOAL #3   Title  Pt to demo improved score on FGA by at least 5 points     Time  6    Period  Weeks    Status  New    Target Date  06/10/17      PT LONG TERM GOAL #4   Title  Pt to demo ability for ambulation on indoor and outdoor surfaces, with balance to be WFL (good) , to decrease fall risk with community navigation    Time  6    Period  Weeks    Status  New    Target Date  06/10/17            Plan - 05/15/17 1609    Clinical Impression Statement  Pt  requires cueing for slowing down speed outside on uneven surface, for increased safety. She has decreased stability when outdoors, but has no LOB today. She demonstrates improved ability for use and sequencing of SPC today. Pt able to perform stepping up over cones as well as reaching outside of BOS today, with improved stability . Recommend continued care.     Rehab Potential  Good    PT Frequency  2x / week    PT Duration  6 weeks    PT Treatment/Interventions  ADLs/Self Care Home Management;Cryotherapy;Electrical Stimulation;Iontophoresis 4mg /ml Dexamethasone;Functional mobility training;Stair training;Gait training;Ultrasound;Moist Heat;Therapeutic activities;Therapeutic exercise;Balance training;Neuromuscular re-education;Patient/family education;Orthotic Fit/Training;Passive range of motion;Dry needling;Manual techniques    Consulted and Agree with Plan of Care  Patient       Patient will benefit from skilled therapeutic intervention in order to improve the following deficits and impairments:  Abnormal gait, Decreased endurance, Decreased activity tolerance, Decreased strength, Pain, Decreased balance, Decreased mobility, Difficulty walking, Improper body mechanics, Impaired perceived functional ability, Decreased coordination, Decreased safety awareness  Visit Diagnosis: Other abnormalities of gait and mobility     Problem List Patient Active Problem List   Diagnosis Date Noted  . Tremor 02/24/2015  . Gait disorder 02/24/2015  . PCP NOTES >>>>>>> 12/13/2014  . Abdominal wall strain 03/19/2012  . Osteopenia 10/04/2011  . Annual physical exam 09/07/2010  . GERD 08/28/2009  . Anxiety and depression 10/06/2006  . Essential hypertension 10/06/2006  . BREAST CANCER, HX OF 10/06/2006   Monique Ballard, PT, DPT 5:03 PM  05/15/17     [place] [phone]  [place], [place], [address] Phone: [phone]   Fax:  [phone]  Name:  Monique Ballard MRN: [id number] Date of Birth: [birth year]

## 2017-05-20 ENCOUNTER — Ambulatory Visit (INDEPENDENT_AMBULATORY_CARE_PROVIDER_SITE_OTHER): Payer: Medicare Other | Admitting: Sports Medicine

## 2017-05-20 ENCOUNTER — Ambulatory Visit (INDEPENDENT_AMBULATORY_CARE_PROVIDER_SITE_OTHER): Payer: Medicare Other | Admitting: Physical Therapy

## 2017-05-20 ENCOUNTER — Ambulatory Visit: Payer: Self-pay

## 2017-05-20 ENCOUNTER — Encounter: Payer: Self-pay | Admitting: Sports Medicine

## 2017-05-20 VITALS — BP 126/84 | HR 88 | Ht 62.0 in | Wt 164.0 lb

## 2017-05-20 DIAGNOSIS — R296 Repeated falls: Secondary | ICD-10-CM | POA: Diagnosis not present

## 2017-05-20 DIAGNOSIS — M25511 Pain in right shoulder: Secondary | ICD-10-CM

## 2017-05-20 DIAGNOSIS — M19011 Primary osteoarthritis, right shoulder: Secondary | ICD-10-CM | POA: Diagnosis not present

## 2017-05-20 DIAGNOSIS — R2689 Other abnormalities of gait and mobility: Secondary | ICD-10-CM

## 2017-05-20 NOTE — Procedures (Signed)
PROCEDURE NOTE:  Ultrasound Guided: Injection: Right shoulder Images were obtained and interpreted by myself, Teresa Coombs, DO  Images have been saved and stored to PACS system. Images obtained on: GE S7 Ultrasound machine    ULTRASOUND FINDINGS:  Tight capsule.  DESCRIPTION OF PROCEDURE:  The patient's clinical condition is marked by substantial pain and/or significant functional disability. Other conservative therapy has not provided relief, is contraindicated, or not appropriate. There is a reasonable likelihood that injection will significantly improve the patient's pain and/or functional impairment.   After discussing the risks, benefits and expected outcomes of the injection and all questions were reviewed and answered, the patient wished to undergo the above named procedure.  Verbal consent was obtained.  The ultrasound was used to identify the target structure and adjacent neurovascular structures. The skin was then prepped in sterile fashion and the target structure was injected under direct visualization using sterile technique as below:  PREP: Alcohol and Ethel Chloride APPROACH: posterior, stopcock technique, 21g 2 in. INJECTATE: 5 cc 1% lidocaine, 2 cc 0.5% Marcaine and 2 cc 40mg /mL DepoMedrol ASPIRATE: None DRESSING: Band-Aid  Post procedural instructions including recommending icing and warning signs for infection were reviewed.    This procedure was well tolerated and there were no complications.   IMPRESSION: Succesful Ultrasound Guided: Injection

## 2017-05-20 NOTE — Therapy (Signed)
[facility] [address] [place], [place], [address] Phone: [phone]   Fax:  [phone]  Physical Therapy Treatment  Patient Details  Name: Monique Ballard MRN: [id number] Date of Birth: July 16, 1940 Referring Provider: Larose Kells   Encounter Date: 05/20/2017  PT End of Session - 05/20/17 1505    Visit Number  7    Number of Visits  12    Date for PT Re-Evaluation  06/10/17    Authorization Type  Medicare    PT Start Time  1300    PT Stop Time  1344    PT Time Calculation (min)  44 min    Equipment Utilized During Treatment  Gait belt    Activity Tolerance  Patient tolerated treatment well    Behavior During Therapy  [facility] for tasks assessed/performed       Past Medical History:  Diagnosis Date  . Breast cancer ([place]) fall 1997    Left Lumpectomy, XRT.  treated with Tamoxifem and Evista  . Depression   . Endometriosis in her 20's   surgery to help clear endometriosis to obtain a pregnancy.  Marland Kitchen GERD (gastroesophageal reflux disease)    w/"stretching" remotely  . HTN (hypertension)     Past Surgical History:  Procedure Laterality Date  . BREAST LUMPECTOMY Left fall 1997   Lymph nodes X 3 were negative, ER+, Radiation treatmetn.  Took Tamoxifem X 5 yrs then Evista for 3-5 yrs.  . CESAREAN SECTION     x2  first pregnancy breach and second normal  . PELVIC FRACTURE SURGERY     endometriosis, remotely    There were no vitals filed for this visit.  Subjective Assessment - 05/20/17 1503    Subjective  Pt with no new complaints. She has been using SPC.     Currently in Pain?  No/denies                       [facility] Adult PT Treatment/Exercise - 05/20/17 1312      Exercises   Exercises  Knee/Hip      Knee/Hip Exercises: Aerobic   Stationary Bike  --      Knee/Hip Exercises: Standing   Hip Flexion  20 reps;Knee bent    Hip Abduction  2 sets;10 reps;Both    Lateral Step Up  Both;15 reps    Lateral Step Up  Limitations  AirEx    Forward Step Up  Both;15 reps    Forward Step Up Limitations  AirEx    Stairs  up/down 5 steps x 6 with 1 hand rail    Gait Training  35 ft x4 bwd;  250 ft outdoors on unevn surface/grass with SPC;  Up/down curb x5  with SPC;     Other Standing Knee Exercises  March/walk 42ft x4 hand at wall;      Other Standing Knee Exercises  Weight shifts A/P x25 each on AirEx;   tandem stance 30 sec x3 bil with head turns ; Lateral and bwd Stepping with weight shifts x20 no UE support;       Knee/Hip Exercises: Seated   Marching  2 sets;10 reps;Both    Sit to [employer]  --             PT Education - 05/20/17 1503    Education provided  Yes    Education Details  Safety with outdoor ambulation, navigation of curbs, stairs, sequencing for [facility]    Person(s) Educated  Patient  Methods  Explanation;Verbal cues    Comprehension  Verbalized understanding;Verbal cues required;Need further instruction       PT Short Term Goals - 04/30/17 1522      PT SHORT TERM GOAL #1   Title  Pt to be independent with HEP for LE strength    Time  2    Period  Weeks    Status  New    Target Date  05/14/17        PT Long Term Goals - 04/30/17 1528      PT LONG TERM GOAL #1   Title  Pt to demo increased strength of B LE's to be at least 4+/5 to improve stability    Time  6    Period  Weeks    Status  New    Target Date  06/10/17      PT LONG TERM GOAL #2   Title  Pt to demo increase in score of BERG by at least 5 points     Time  6    Period  Weeks    Status  New    Target Date  06/10/17      PT LONG TERM GOAL #3   Title  Pt to demo improved score on FGA by at least 5 points     Time  6    Period  Weeks    Status  New    Target Date  06/10/17      PT LONG TERM GOAL #4   Title  Pt to demo ability for ambulation on indoor and outdoor surfaces, with balance to be WFL (good) , to decrease fall risk with community navigation    Time  6    Period  Weeks    Status  New     Target Date  06/10/17            Plan - 05/20/17 1506    Clinical Impression Statement  Pt with no LOB outdoors today. Improved sequencing with cane on curbs. She has difficulty clearing foot consistently on stairs, mostly with descending. She is improving with stability with weight shifts and static balance, decreased sway noted. Pt has been able to progress level of difficulty of neuro re-ed, progressing well.     Rehab Potential  Good    PT Frequency  2x / week    PT Duration  6 weeks    PT Treatment/Interventions  ADLs/Self Care Home Management;Cryotherapy;Electrical Stimulation;Iontophoresis 4mg /ml Dexamethasone;Functional mobility training;Stair training;Gait training;Ultrasound;Moist Heat;Therapeutic activities;Therapeutic exercise;Balance training;Neuromuscular re-education;Patient/family education;Orthotic Fit/Training;Passive range of motion;Dry needling;Manual techniques    Consulted and Agree with Plan of Care  Patient       Patient will benefit from skilled therapeutic intervention in order to improve the following deficits and impairments:  Abnormal gait, Decreased endurance, Decreased activity tolerance, Decreased strength, Pain, Decreased balance, Decreased mobility, Difficulty walking, Improper body mechanics, Impaired perceived functional ability, Decreased coordination, Decreased safety awareness  Visit Diagnosis: Other abnormalities of gait and mobility  Frequent falls     Problem List Patient Active Problem List   Diagnosis Date Noted  . Tremor 02/24/2015  . Gait disorder 02/24/2015  . PCP NOTES >>>>>>> 12/13/2014  . Abdominal wall strain 03/19/2012  . Osteopenia 10/04/2011  . Annual physical exam 09/07/2010  . GERD 08/28/2009  . Anxiety and depression 10/06/2006  . Essential hypertension 10/06/2006  . BREAST CANCER, HX OF 10/06/2006   Lyndee Hensen, PT, DPT 3:09 PM  05/20/17    [facility]  [place] [address]  [place], [place], [address] Phone: [phone]   Fax:  [phone]  Name: Monique Ballard MRN: [id number] Date of Birth: 07-25-1940

## 2017-05-20 NOTE — Patient Instructions (Signed)

## 2017-05-20 NOTE — Progress Notes (Signed)
[place] Bond. [place], [place] at [facility] [phone]  Ryane Konieczny - 77 y.o. female MRN [id number]  Date of birth: 1940-03-15  Visit Date: 05/20/2017  PCP: Colon Branch, MD   Referred by: Colon Branch, MD  Scribe for today's visit: Josepha Pigg, CMA     SUBJECTIVE:  Monique Shell "Elsye" is here for Initial Assessment  Her R shoulder pain symptoms INITIALLY: Began the end of December and began after reaching back in the car and lifting a heavy object. Pain is anterior.  Described as moderate grabbing pain, nonradiating Worsened with reaching down and back (to fasten bra). Also worse when reaching out and when reaching up.  Improved with rest Additional associated symptoms include: She denies neck pain, n/t in arm or hand.     At this time symptoms are improving compared to onset  She has been using Salonpas with minimal relief. She has used IcyHot with some relief.   ROS Denies night time disturbances. Denies fevers, chills, or night sweats. Denies unexplained weight loss. Reports personal history of cancer, left breast. Denies changes in bowel or bladder habits. Reports recent unreported falls - fell 05/11/17. Head injury with prior fall.  Denies new or worsening dyspnea or wheezing. Denies headaches or dizziness.  Denies numbness, tingling or weakness  In the extremities.  Denies dizziness or presyncopal episodes Denies lower extremity edema    HISTORY & PERTINENT PRIOR DATA:  Prior History reviewed and updated per electronic medical record.  Significant/pertinent history, findings, studies include:  reports that she has quit smoking. She has never used smokeless tobacco. No results for input(s): HGBA1C, LABURIC, CREATINE in the last 8760 hours. No specialty comments available. No problems updated.  OBJECTIVE:  VS:  HT:5\' 2"  (157.5 cm)   WT:164 lb (74.4 kg)  BMI:29.99    BP:126/84  HR:88bpm  TEMP: ( )  RESP:92  %   PHYSICAL EXAM: Constitutional: WDWN, Non-toxic appearing. Psychiatric: Alert & appropriately interactive.  Not depressed or anxious appearing. Respiratory: No increased work of breathing.  Trachea Midline Eyes: Pupils are equal.  EOM intact without nystagmus.  No scleral icterus  Vascular Exam: warm to touch no edema  upper extremity neuro exam: unremarkable  MSK Exam: Right shoulder has pain with palpation of the posterior capsule.  She has good internal and external rotation.  There is pain however with terminal range of motion.  Some pain with axial load and circumduction with small amount of crepitation.  Intrinsic rotator cuff strength is intact.   ASSESSMENT & PLAN:   1. Right shoulder pain, unspecified chronicity   2. Primary osteoarthritis of left shoulder     PLAN: She does have some evidence of fragmentation on ultrasound that does suggest that this is likely more of a true osteoarthritic change of the glenohumeral joint.  We will go ahead and inject her today and have her begin/continue working with physical therapy.  If any lack of improvement can consider further diagnostic evaluation.  Follow-up: Return in about 6 weeks (around 07/01/2017).      Please see additional documentation for Objective, Assessment and Plan sections. Pertinent additional documentation may be included in corresponding procedure notes, imaging studies, problem based documentation and patient instructions. Please see these sections of the encounter for additional information regarding this visit.  CMA/ATC served as Education administrator during this visit. History, Physical, and Plan performed by medical provider. Documentation and orders reviewed and attested to.  Michael D Rigby, DO    [facility] Physician   

## 2017-05-22 ENCOUNTER — Ambulatory Visit (INDEPENDENT_AMBULATORY_CARE_PROVIDER_SITE_OTHER): Payer: Medicare Other | Admitting: Physical Therapy

## 2017-05-22 ENCOUNTER — Encounter: Payer: Self-pay | Admitting: Physical Therapy

## 2017-05-22 DIAGNOSIS — R2689 Other abnormalities of gait and mobility: Secondary | ICD-10-CM | POA: Diagnosis not present

## 2017-05-22 DIAGNOSIS — R296 Repeated falls: Secondary | ICD-10-CM

## 2017-05-27 ENCOUNTER — Encounter: Payer: Self-pay | Admitting: Physical Therapy

## 2017-05-27 ENCOUNTER — Ambulatory Visit (INDEPENDENT_AMBULATORY_CARE_PROVIDER_SITE_OTHER): Payer: Medicare Other | Admitting: Physical Therapy

## 2017-05-27 DIAGNOSIS — R296 Repeated falls: Secondary | ICD-10-CM

## 2017-05-27 DIAGNOSIS — R2689 Other abnormalities of gait and mobility: Secondary | ICD-10-CM

## 2017-05-27 NOTE — Therapy (Signed)
[facility] [address] [place], [place], [address] Phone: [phone]   Fax:  [phone]  Physical Therapy Treatment  Patient Details  Name: Monique Ballard MRN: [id number] Date of Birth: 1940/08/20 Referring Provider: Larose Kells   Encounter Date: 05/22/2017  PT End of Session - 05/27/17 1136    Visit Number  8    Number of Visits  12    Date for PT Re-Evaluation  06/10/17    Authorization Type  Medicare    PT Start Time  1430    PT Stop Time  5701    PT Time Calculation (min)  44 min    Equipment Utilized During Treatment  Gait belt    Activity Tolerance  Patient tolerated treatment well    Behavior During Therapy  [facility] for tasks assessed/performed       Past Medical History:  Diagnosis Date  . Breast cancer ([place]) fall 1997    Left Lumpectomy, XRT.  treated with Tamoxifem and Evista  . Depression   . Endometriosis in her 20's   surgery to help clear endometriosis to obtain a pregnancy.  Marland Kitchen GERD (gastroesophageal reflux disease)    w/"stretching" remotely  . HTN (hypertension)     Past Surgical History:  Procedure Laterality Date  . BREAST LUMPECTOMY Left fall 1997   Lymph nodes X 3 were negative, ER+, Radiation treatmetn.  Took Tamoxifem X 5 yrs then Evista for 3-5 yrs.  . CESAREAN SECTION     x2  first pregnancy breach and second normal  . PELVIC FRACTURE SURGERY     endometriosis, remotely    There were no vitals filed for this visit.  Subjective Assessment - 05/27/17 1136    Subjective  Pt had injection of R shoulder this week, states much improved pain. Pt states no falls, has been doing HEP for LEs     Currently in Pain?  No/denies                       [facility] Adult PT Treatment/Exercise - 05/27/17 0001      Exercises   Exercises  Knee/Hip      Knee/Hip Exercises: Aerobic   Stationary Bike  L1 x 6 min      Knee/Hip Exercises: Standing   Hip Flexion  20 reps;Knee bent    Hip Flexion  Limitations  1.5 lb    Hip Abduction  2 sets;10 reps;Both    Lateral Step Up  Both;15 reps    Lateral Step Up Limitations  AirEx    Forward Step Up  Both;15 reps    Forward Step Up Limitations  AirEx    Stairs  up/down 5 steps x 6 with 1 hand rail    Gait Training  35 ft x4 bwd;  250 ft outdoors on unevn surface/grass with SPC;  Up/down curb x5  with SPC;     Other Standing Knee Exercises  March/walk 62ft x4 hand at wall;      Other Standing Knee Exercises  Weight shifts A/P x25 each on AirEx;   tandem stance 30 sec x3 bil with head turns ;       Knee/Hip Exercises: Seated   Long Arc Quad  2 sets;10 reps;Both    Long Arc Quad Weight  2 lbs.    Marching  2 sets;10 reps;Both             PT Education - 05/27/17 1136    Education provided  Yes    Education Details  HEP, use of cane    Person(s) Educated  Patient    Methods  Explanation;Verbal cues;Demonstration    Comprehension  Verbalized understanding;Need further instruction;Verbal cues required       PT Short Term Goals - 04/30/17 1522      PT SHORT TERM GOAL #1   Title  Pt to be independent with HEP for LE strength    Time  2    Period  Weeks    Status  New    Target Date  05/14/17        PT Long Term Goals - 04/30/17 1528      PT LONG TERM GOAL #1   Title  Pt to demo increased strength of B LE's to be at least 4+/5 to improve stability    Time  6    Period  Weeks    Status  New    Target Date  06/10/17      PT LONG TERM GOAL #2   Title  Pt to demo increase in score of BERG by at least 5 points     Time  6    Period  Weeks    Status  New    Target Date  06/10/17      PT LONG TERM GOAL #3   Title  Pt to demo improved score on FGA by at least 5 points     Time  6    Period  Weeks    Status  New    Target Date  06/10/17      PT LONG TERM GOAL #4   Title  Pt to demo ability for ambulation on indoor and outdoor surfaces, with balance to be WFL (good) , to decrease fall risk with community navigation     Time  6    Period  Weeks    Status  New    Target Date  06/10/17            Plan - 05/27/17 1132    Clinical Impression Statement  Focus on LE strength today, pt improving with strength and ability to perform exercises with minimal cueing. She Demonstrates improved score on TUG, with increased speed, and improved balance. She is also showing improvements with dynamic gait , she demonstrates need for less cuing indoors, with activities, but still requires mod cueing for safety with outdoor ambulation, for slowing speed, using vision, and foot placement on curb. Plan to continue to progress balance on uneven surfaces, and review sequencing with cane on curb next visit.     Rehab Potential  Good    PT Frequency  2x / week    PT Duration  6 weeks    PT Treatment/Interventions  ADLs/Self Care Home Management;Cryotherapy;Electrical Stimulation;Iontophoresis 4mg /ml Dexamethasone;Functional mobility training;Stair training;Gait training;Ultrasound;Moist Heat;Therapeutic activities;Therapeutic exercise;Balance training;Neuromuscular re-education;Patient/family education;Orthotic Fit/Training;Passive range of motion;Dry needling;Manual techniques    Consulted and Agree with Plan of Care  Patient       Patient will benefit from skilled therapeutic intervention in order to improve the following deficits and impairments:  Abnormal gait, Decreased endurance, Decreased activity tolerance, Decreased strength, Pain, Decreased balance, Decreased mobility, Difficulty walking, Improper body mechanics, Impaired perceived functional ability, Decreased coordination, Decreased safety awareness  Visit Diagnosis: Other abnormalities of gait and mobility  Frequent falls     Problem List Patient Active Problem List   Diagnosis Date Noted  . Tremor 02/24/2015  . Gait disorder 02/24/2015  . PCP NOTES >>>>>>>  12/13/2014  . Abdominal wall strain 03/19/2012  . Osteopenia 10/04/2011  . Annual physical exam  09/07/2010  . GERD 08/28/2009  . Anxiety and depression 10/06/2006  . Essential hypertension 10/06/2006  . BREAST CANCER, HX OF 10/06/2006    Lyndee Hensen, PT, DPT 11:38 AM  05/27/17    [facility] [place], [place], [address] Phone: [phone]   Fax:  [phone]  Name: Vernis Cabacungan MRN: [id number] Date of Birth: 1940/02/27

## 2017-05-27 NOTE — Therapy (Deleted)
[facility] [address] [place], [place], [address] Phone: [phone]   Fax:  [phone]  Physical Therapy Treatment  Patient Details  Name: Monique Ballard MRN: [id number] Date of Birth: October 31, 1940 Referring Provider: Larose Kells   Encounter Date: 05/22/2017  PT End of Session - 05/27/17 1136    Visit Number  8    Number of Visits  12    Date for PT Re-Evaluation  06/10/17    Authorization Type  Medicare    PT Start Time  1430    PT Stop Time  3790    PT Time Calculation (min)  44 min    Equipment Utilized During Treatment  Gait belt    Activity Tolerance  Patient tolerated treatment well    Behavior During Therapy  [facility] for tasks assessed/performed       Past Medical History:  Diagnosis Date  . Breast cancer ([place]) fall 1997    Left Lumpectomy, XRT.  treated with Tamoxifem and Evista  . Depression   . Endometriosis in her 20's   surgery to help clear endometriosis to obtain a pregnancy.  Marland Kitchen GERD (gastroesophageal reflux disease)    w/"stretching" remotely  . HTN (hypertension)     Past Surgical History:  Procedure Laterality Date  . BREAST LUMPECTOMY Left fall 1997   Lymph nodes X 3 were negative, ER+, Radiation treatmetn.  Took Tamoxifem X 5 yrs then Evista for 3-5 yrs.  . CESAREAN SECTION     x2  first pregnancy breach and second normal  . PELVIC FRACTURE SURGERY     endometriosis, remotely    There were no vitals filed for this visit.  Subjective Assessment - 05/27/17 1136    Subjective  Pt had injection of R shoulder this week, states much improved pain. Pt states no falls, has been doing HEP for LEs     Currently in Pain?  No/denies                       [facility] Adult PT Treatment/Exercise - 05/27/17 0001      Exercises   Exercises  Knee/Hip      Knee/Hip Exercises: Aerobic   Stationary Bike  L1 x 6 min      Knee/Hip Exercises: Standing   Hip Flexion  20 reps;Knee bent    Hip Flexion  Limitations  1.5 lb    Hip Abduction  2 sets;10 reps;Both    Lateral Step Up  Both;15 reps    Lateral Step Up Limitations  AirEx    Forward Step Up  Both;15 reps    Forward Step Up Limitations  AirEx    Stairs  up/down 5 steps x 6 with 1 hand rail    Gait Training  35 ft x4 bwd;  250 ft outdoors on unevn surface/grass with SPC;  Up/down curb x5  with SPC;     Other Standing Knee Exercises  March/walk 48ft x4 hand at wall;      Other Standing Knee Exercises  Weight shifts A/P x25 each on AirEx;   tandem stance 30 sec x3 bil with head turns ;       Knee/Hip Exercises: Seated   Long Arc Quad  2 sets;10 reps;Both    Long Arc Quad Weight  2 lbs.    Marching  2 sets;10 reps;Both             PT Education - 05/27/17 1136    Education provided  Yes    Education Details  HEP, use of cane    Person(s) Educated  Patient    Methods  Explanation;Verbal cues;Demonstration    Comprehension  Verbalized understanding;Need further instruction;Verbal cues required       PT Short Term Goals - 04/30/17 1522      PT SHORT TERM GOAL #1   Title  Pt to be independent with HEP for LE strength    Time  2    Period  Weeks    Status  New    Target Date  05/14/17        PT Long Term Goals - 04/30/17 1528      PT LONG TERM GOAL #1   Title  Pt to demo increased strength of B LE's to be at least 4+/5 to improve stability    Time  6    Period  Weeks    Status  New    Target Date  06/10/17      PT LONG TERM GOAL #2   Title  Pt to demo increase in score of BERG by at least 5 points     Time  6    Period  Weeks    Status  New    Target Date  06/10/17      PT LONG TERM GOAL #3   Title  Pt to demo improved score on FGA by at least 5 points     Time  6    Period  Weeks    Status  New    Target Date  06/10/17      PT LONG TERM GOAL #4   Title  Pt to demo ability for ambulation on indoor and outdoor surfaces, with balance to be WFL (good) , to decrease fall risk with community navigation     Time  6    Period  Weeks    Status  New    Target Date  06/10/17            Plan - 05/27/17 1132    Clinical Impression Statement  Focus on LE strength today, pt improving with strength and ability to perform exercises with minimal cueing. She Demonstrates improved score on TUG, with increased speed, and improved balance. She is also showing improvements with dynamic gait , she demonstrates need for less cuing indoors, with activities, but still requires mod cueing for safety with outdoor ambulation, for slowing speed, using vision, and foot placement on curb. Plan to continue to progress balance on uneven surfaces, and review sequencing with cane on curb next visit.     Rehab Potential  Good    PT Frequency  2x / week    PT Duration  6 weeks    PT Treatment/Interventions  ADLs/Self Care Home Management;Cryotherapy;Electrical Stimulation;Iontophoresis 4mg /ml Dexamethasone;Functional mobility training;Stair training;Gait training;Ultrasound;Moist Heat;Therapeutic activities;Therapeutic exercise;Balance training;Neuromuscular re-education;Patient/family education;Orthotic Fit/Training;Passive range of motion;Dry needling;Manual techniques    Consulted and Agree with Plan of Care  Patient       Patient will benefit from skilled therapeutic intervention in order to improve the following deficits and impairments:  Abnormal gait, Decreased endurance, Decreased activity tolerance, Decreased strength, Pain, Decreased balance, Decreased mobility, Difficulty walking, Improper body mechanics, Impaired perceived functional ability, Decreased coordination, Decreased safety awareness  Visit Diagnosis: Other abnormalities of gait and mobility  Frequent falls     Problem List Patient Active Problem List   Diagnosis Date Noted  . Tremor 02/24/2015  . Gait disorder 02/24/2015  . PCP NOTES >>>>>>>  12/13/2014  . Abdominal wall strain 03/19/2012  . Osteopenia 10/04/2011  . Annual physical exam  09/07/2010  . GERD 08/28/2009  . Anxiety and depression 10/06/2006  . Essential hypertension 10/06/2006  . BREAST CANCER, HX OF 10/06/2006    Lyndee Hensen 05/27/2017, 11:37 AM  [facility] [place], [place], [address] Phone: [phone]   Fax:  [phone]  Name: Monique Ballard MRN: [id number] Date of Birth: 1940-10-15

## 2017-05-27 NOTE — Therapy (Deleted)
[facility] [address] [place], [place], [address] Phone: [phone]   Fax:  [phone]  Physical Therapy Treatment  Patient Details  Name: Monique Ballard MRN: [id number] Date of Birth: 1940-01-26 Referring Provider: Larose Kells   Encounter Date: 05/22/2017    Past Medical History:  Diagnosis Date  . Breast cancer ([place]) fall 1997    Left Lumpectomy, XRT.  treated with Tamoxifem and Evista  . Depression   . Endometriosis in her 20's   surgery to help clear endometriosis to obtain a pregnancy.  Marland Ballard GERD (gastroesophageal reflux disease)    w/"stretching" remotely  . HTN (hypertension)     Past Surgical History:  Procedure Laterality Date  . BREAST LUMPECTOMY Left fall 1997   Lymph nodes X 3 were negative, ER+, Radiation treatmetn.  Took Tamoxifem X 5 yrs then Evista for 3-5 yrs.  . CESAREAN SECTION     x2  first pregnancy breach and second normal  . PELVIC FRACTURE SURGERY     endometriosis, remotely    There were no vitals filed for this visit.                    [facility] Adult PT Treatment/Exercise - 05/27/17 0001      Exercises   Exercises  Knee/Hip      Knee/Hip Exercises: Aerobic   Stationary Bike  L1 x 6 min      Knee/Hip Exercises: Standing   Hip Flexion  20 reps;Knee bent    Hip Flexion Limitations  1.5 lb    Hip Abduction  2 sets;10 reps;Both    Lateral Step Up  Both;15 reps    Lateral Step Up Limitations  AirEx    Forward Step Up  Both;15 reps    Forward Step Up Limitations  AirEx    Stairs  up/down 5 steps x 6 with 1 hand rail    Gait Training  35 ft x4 bwd;  250 ft outdoors on unevn surface/grass with SPC;  Up/down curb x5  with SPC;     Other Standing Knee Exercises  March/walk 64ft x4 hand at wall;      Other Standing Knee Exercises  Weight shifts A/P x25 each on AirEx;   tandem stance 30 sec x3 bil with head turns ;       Knee/Hip Exercises: Seated   Long Arc Quad  2 sets;10 reps;Both    Long Arc Quad Weight  2 lbs.    Marching  2 sets;10 reps;Both               PT Short Term Goals - 04/30/17 1522      PT SHORT TERM GOAL #1   Title  Pt to be independent with HEP for LE strength    Time  2    Period  Weeks    Status  New    Target Date  05/14/17        PT Long Term Goals - 04/30/17 1528      PT LONG TERM GOAL #1   Title  Pt to demo increased strength of B LE's to be at least 4+/5 to improve stability    Time  6    Period  Weeks    Status  New    Target Date  06/10/17      PT LONG TERM GOAL #2   Title  Pt to demo increase in score of BERG by at least 5 points  Time  6    Period  Weeks    Status  New    Target Date  06/10/17      PT LONG TERM GOAL #3   Title  Pt to demo improved score on FGA by at least 5 points     Time  6    Period  Weeks    Status  New    Target Date  06/10/17      PT LONG TERM GOAL #4   Title  Pt to demo ability for ambulation on indoor and outdoor surfaces, with balance to be WFL (good) , to decrease fall risk with community navigation    Time  6    Period  Weeks    Status  New    Target Date  06/10/17            Plan - 05/27/17 1132    Clinical Impression Statement  Focus on LE strength today, pt improving with strength and ability to perform exercises with minimal cueing. She Demonstrates improved score on TUG, with increased speed, and improved balance. She is also showing improvements with dynamic gait , she demonstrates need for less cuing indoors, with activities, but still requires mod cueing for safety with outdoor ambulation, for slowing speed, using vision, and foot placement on curb. Plan to continue to progress balance on uneven surfaces, and review sequencing with cane on curb next visit.     Rehab Potential  Good    PT Frequency  2x / week    PT Duration  6 weeks    PT Treatment/Interventions  ADLs/Self Care Home Management;Cryotherapy;Electrical Stimulation;Iontophoresis 4mg /ml  Dexamethasone;Functional mobility training;Stair training;Gait training;Ultrasound;Moist Heat;Therapeutic activities;Therapeutic exercise;Balance training;Neuromuscular re-education;Patient/family education;Orthotic Fit/Training;Passive range of motion;Dry needling;Manual techniques    Consulted and Agree with Plan of Care  Patient       Patient will benefit from skilled therapeutic intervention in order to improve the following deficits and impairments:  Abnormal gait, Decreased endurance, Decreased activity tolerance, Decreased strength, Pain, Decreased balance, Decreased mobility, Difficulty walking, Improper body mechanics, Impaired perceived functional ability, Decreased coordination, Decreased safety awareness  Visit Diagnosis: Other abnormalities of gait and mobility  Frequent falls     Problem List Patient Active Problem List   Diagnosis Date Noted  . Tremor 02/24/2015  . Gait disorder 02/24/2015  . PCP NOTES >>>>>>> 12/13/2014  . Abdominal wall strain 03/19/2012  . Osteopenia 10/04/2011  . Annual physical exam 09/07/2010  . GERD 08/28/2009  . Anxiety and depression 10/06/2006  . Essential hypertension 10/06/2006  . BREAST CANCER, HX OF 10/06/2006    Lyndee Hensen 05/27/2017, 11:36 AM  [facility] [place], [place], [address] Phone: [phone]   Fax:  [phone]  Name: Monique Ballard MRN: [id number] Date of Birth: 1941-01-13

## 2017-05-28 NOTE — Therapy (Signed)
[facility] [address] [place], [place], [address] Phone: [phone]   Fax:  [phone]  Physical Therapy Treatment  Patient Details  Name: Monique Ballard MRN: [id number] Date of Birth: 1940-09-30 Referring Provider: Larose Kells   Encounter Date: 05/27/2017  PT End of Session - 05/27/17 1459    Visit Number  9    Number of Visits  12    Date for PT Re-Evaluation  06/10/17    Authorization Type  Medicare    PT Start Time  1431    PT Stop Time  1515    PT Time Calculation (min)  44 min    Equipment Utilized During Treatment  Gait belt    Activity Tolerance  Patient tolerated treatment well    Behavior During Therapy  [facility] for tasks assessed/performed       Past Medical History:  Diagnosis Date  . Breast cancer ([place]) fall 1997    Left Lumpectomy, XRT.  treated with Tamoxifem and Evista  . Depression   . Endometriosis in her 20's   surgery to help clear endometriosis to obtain a pregnancy.  Marland Kitchen GERD (gastroesophageal reflux disease)    w/"stretching" remotely  . HTN (hypertension)     Past Surgical History:  Procedure Laterality Date  . BREAST LUMPECTOMY Left fall 1997   Lymph nodes X 3 were negative, ER+, Radiation treatmetn.  Took Tamoxifem X 5 yrs then Evista for 3-5 yrs.  . CESAREAN SECTION     x2  first pregnancy breach and second normal  . PELVIC FRACTURE SURGERY     endometriosis, remotely    There were no vitals filed for this visit.  Subjective Assessment - 05/27/17 1450    Subjective  Pt states increased confidence with home and community activities. She does state some concern for getting in/out of shower.     Currently in Pain?  No/denies                       [facility] Adult PT Treatment/Exercise - 05/27/17 1501      Exercises   Exercises  Knee/Hip      Knee/Hip Exercises: Aerobic   Stationary Bike  --      Knee/Hip Exercises: Standing   Hip Flexion  20 reps;Knee bent    Hip Flexion  Limitations  Air Ex    Hip Abduction  2 sets;10 reps;Both    Lateral Step Up  --    Lateral Step Up Limitations  --    Forward Step Up  Both;15 reps    Forward Step Up Limitations  AirEx    Stairs  up/down 5 steps x 4 with 1 hand rail: step ups x10 bil 6 in step;     Gait Training  35 ft x4 bwd;  250 ft outdoors on unevn surface/grass with SPC;  Stepping with weight shifting/lateral x20 on grass; Up/down curb x3  with SPC;     Other Standing Knee Exercises  March/walk 28ft x4 hand at wall;      Other Standing Knee Exercises  Weight shifts A/P x25 each on AirEx;   corner balance with head turns  3x 10 ;       Knee/Hip Exercises: Seated   Long Arc Quad  --    Long Arc [employer]  --    Warden/ranger  --             PT Education - 05/27/17 1453    Education  provided  Yes    Education Details  Home safety/ safety in bathroom, use of cane.     Person(s) Educated  Patient    Methods  Explanation    Comprehension  Verbalized understanding       PT Short Term Goals - 04/30/17 1522      PT SHORT TERM GOAL #1   Title  Pt to be independent with HEP for LE strength    Time  2    Period  Weeks    Status  New    Target Date  05/14/17        PT Long Term Goals - 04/30/17 1528      PT LONG TERM GOAL #1   Title  Pt to demo increased strength of B LE's to be at least 4+/5 to improve stability    Time  6    Period  Weeks    Status  New    Target Date  06/10/17      PT LONG TERM GOAL #2   Title  Pt to demo increase in score of BERG by at least 5 points     Time  6    Period  Weeks    Status  New    Target Date  06/10/17      PT LONG TERM GOAL #3   Title  Pt to demo improved score on FGA by at least 5 points     Time  6    Period  Weeks    Status  New    Target Date  06/10/17      PT LONG TERM GOAL #4   Title  Pt to demo ability for ambulation on indoor and outdoor surfaces, with balance to be WFL (good) , to decrease fall risk with community navigation    Time  6     Period  Weeks    Status  New    Target Date  06/10/17            Plan - 05/28/17 1154    Clinical Impression Statement  Less verbal cueing used today during session, and pt was able to demonstrate independent ability to navigate curb with SPC. She also has improved safety awareness outdoors, with increased use of vision,and slower speed. She requires less cuing on stairs, and is able to self-correct when placing part vs whole foot on step. She has no LOB with activities today, no contact guard used except with outdoor ambulation. Recommended installation of grab bar in bathroom for use when stepping in/out of shower. Pt progressing well with balance activities, plan to d/c in next 1-2 weeks.     Rehab Potential  Good    PT Frequency  2x / week    PT Duration  6 weeks    PT Treatment/Interventions  ADLs/Self Care Home Management;Cryotherapy;Electrical Stimulation;Iontophoresis 4mg /ml Dexamethasone;Functional mobility training;Stair training;Gait training;Ultrasound;Moist Heat;Therapeutic activities;Therapeutic exercise;Balance training;Neuromuscular re-education;Patient/family education;Orthotic Fit/Training;Passive range of motion;Dry needling;Manual techniques    Consulted and Agree with Plan of Care  Patient       Patient will benefit from skilled therapeutic intervention in order to improve the following deficits and impairments:  Abnormal gait, Decreased endurance, Decreased activity tolerance, Decreased strength, Pain, Decreased balance, Decreased mobility, Difficulty walking, Improper body mechanics, Impaired perceived functional ability, Decreased coordination, Decreased safety awareness  Visit Diagnosis: Other abnormalities of gait and mobility  Frequent falls     Problem List Patient Active Problem List   Diagnosis Date Noted  .  Tremor 02/24/2015  . Gait disorder 02/24/2015  . PCP NOTES >>>>>>> 12/13/2014  . Abdominal wall strain 03/19/2012  . Osteopenia 10/04/2011  .  Annual physical exam 09/07/2010  . GERD 08/28/2009  . Anxiety and depression 10/06/2006  . Essential hypertension 10/06/2006  . BREAST CANCER, HX OF 10/06/2006   Lyndee Hensen, PT, DPT 11:59 AM  05/28/17    [facility] [place], [place], [address] Phone: [phone]   Fax:  [phone]  Name: Monique Ballard MRN: [id number] Date of Birth: [birth year]

## 2017-05-29 ENCOUNTER — Ambulatory Visit (INDEPENDENT_AMBULATORY_CARE_PROVIDER_SITE_OTHER): Payer: Medicare Other | Admitting: Physical Therapy

## 2017-05-29 ENCOUNTER — Encounter: Payer: Self-pay | Admitting: Physical Therapy

## 2017-05-29 DIAGNOSIS — R2689 Other abnormalities of gait and mobility: Secondary | ICD-10-CM

## 2017-05-29 DIAGNOSIS — R296 Repeated falls: Secondary | ICD-10-CM | POA: Diagnosis not present

## 2017-05-30 ENCOUNTER — Telehealth: Payer: Self-pay | Admitting: Internal Medicine

## 2017-05-30 NOTE — Telephone Encounter (Signed)
Pt is requesting refill on clonazepam.   Last OV: 04/21/2017 Last Fill: 04/21/2017 #60 and 9YT UDS: 04/21/2017 Low risk  [facility] printed 04/25/2017- in media- no discrepancies noted  Please advise.

## 2017-05-30 NOTE — Telephone Encounter (Signed)
Sent!

## 2017-06-03 ENCOUNTER — Ambulatory Visit (INDEPENDENT_AMBULATORY_CARE_PROVIDER_SITE_OTHER): Payer: Medicare Other | Admitting: Physical Therapy

## 2017-06-03 DIAGNOSIS — R296 Repeated falls: Secondary | ICD-10-CM

## 2017-06-03 DIAGNOSIS — M25511 Pain in right shoulder: Secondary | ICD-10-CM

## 2017-06-03 DIAGNOSIS — R2689 Other abnormalities of gait and mobility: Secondary | ICD-10-CM | POA: Diagnosis not present

## 2017-06-05 ENCOUNTER — Encounter: Payer: Self-pay | Admitting: Physical Therapy

## 2017-06-05 NOTE — Therapy (Signed)
[facility] [address] [place], [place], [address] Phone: [phone]   Fax:  [phone]  Physical Therapy Treatment/Discharge  Patient Details  Name: Monique Ballard MRN: [id number] Date of Birth: 10-Oct-1940 Referring Provider: Larose Kells   Encounter Date: 06/03/2017  PT End of Session - 06/05/17 1457    Visit Number  11    Number of Visits  12    Date for PT Re-Evaluation  06/10/17    Authorization Type  Medicare    PT Start Time  1430    PT Stop Time  1513    PT Time Calculation (min)  43 min    Equipment Utilized During Treatment  Gait belt    Activity Tolerance  Patient tolerated treatment well    Behavior During Therapy  [facility] for tasks assessed/performed       Past Medical History:  Diagnosis Date  . Breast cancer ([place]) fall 1997    Left Lumpectomy, XRT.  treated with Tamoxifem and Evista  . Depression   . Endometriosis in her 20's   surgery to help clear endometriosis to obtain a pregnancy.  Marland Kitchen GERD (gastroesophageal reflux disease)    w/"stretching" remotely  . HTN (hypertension)     Past Surgical History:  Procedure Laterality Date  . BREAST LUMPECTOMY Left fall 1997   Lymph nodes X 3 were negative, ER+, Radiation treatmetn.  Took Tamoxifem X 5 yrs then Evista for 3-5 yrs.  . CESAREAN SECTION     x2  first pregnancy breach and second normal  . PELVIC FRACTURE SURGERY     endometriosis, remotely    There were no vitals filed for this visit.  Subjective Assessment - 06/05/17 1454    Subjective  Pt with no complaints. Feels like she is doing very well/ pleased with progress.    Currently in Pain?  No/denies         [facility] PT Assessment - 06/05/17 1450      Berg Balance Test   Sit to Stand  Able to stand without using hands and stabilize independently    Standing Unsupported  Able to stand safely 2 minutes    Sitting with Back Unsupported but Feet Supported on Floor or Stool  Able to sit safely and securely 2  minutes    Stand to Sit  Sits safely with minimal use of hands    Transfers  Able to transfer safely, minor use of hands    Standing Unsupported with Eyes Closed  Able to stand 10 seconds safely    Standing Ubsupported with Feet Together  Able to place feet together independently and stand 1 minute safely    From Standing, Reach Forward with Outstretched Arm  Can reach forward >12 cm safely (5")    From Standing Position, Pick up Object from Floor  Able to pick up shoe safely and easily    From Standing Position, Turn to Look Behind Over each Shoulder  Looks behind from both sides and weight shifts well    Turn 360 Degrees  Able to turn 360 degrees safely one side only in 4 seconds or less    Standing Unsupported, Alternately Place Feet on Step/Stool  Able to stand independently and safely and complete 8 steps in 20 seconds    Standing Unsupported, One Foot in Front  Able to take small step independently and hold 30 seconds    Standing on One Leg  Able to lift leg independently and hold equal to or more  than 3 seconds    Total Score  50      Functional Gait  Assessment   Gait Level Surface  Walks 20 ft in less than 5.5 sec, no assistive devices, good speed, no evidence for imbalance, normal gait pattern, deviates no more than 6 in outside of the 12 in walkway width.    Change in Gait Speed  Able to smoothly change walking speed without loss of balance or gait deviation. Deviate no more than 6 in outside of the 12 in walkway width.    Gait with Horizontal Head Turns  Performs head turns smoothly with no change in gait. Deviates no more than 6 in outside 12 in walkway width    Gait with Vertical Head Turns  Performs head turns with no change in gait. Deviates no more than 6 in outside 12 in walkway width.    Gait and Pivot Turn  Pivot turns safely within 3 sec and stops quickly with no loss of balance.    Step Over Obstacle  Is able to step over one shoe box (4.5 in total height) but must slow down  and adjust steps to clear box safely. May require verbal cueing.    Gait with Narrow Base of Support  Ambulates less than 4 steps heel to toe or cannot perform without assistance.    Gait with Eyes Closed  Walks 20 ft, uses assistive device, slower speed, mild gait deviations, deviates 6-10 in outside 12 in walkway width. Ambulates 20 ft in less than 9 sec but greater than 7 sec.    Ambulating Backwards  Walks 20 ft, uses assistive device, slower speed, mild gait deviations, deviates 6-10 in outside 12 in walkway width.    Steps  Alternating feet, must use rail.    Total Score  22                   [facility] Adult PT Treatment/Exercise - 06/05/17 1451      Exercises   Exercises  Knee/Hip      Knee/Hip Exercises: Standing   Hip Flexion  20 reps;Knee bent    Hip Flexion Limitations  Air Ex    Hip Abduction  2 sets;10 reps;Both    Stairs  up/down 5 steps x 4 with 1 hand rail:     Gait Training  500 ft around clinic with unfamiliar scenery, and verbal cues for direction changes    Other Standing Knee Exercises  March/walk 25f x4 hand at wall;   Toe taps on 6 in step x20;     Other Standing Knee Exercises   corner balance:   with head turns L/R and Up/down 3x 10 ; Weight shifts A/P x30 ; Tandem stance 30 sec x2 bil; Staggered stance weight shifts x20 bil;              PT Education - 06/05/17 1457    Education provided  Yes    Education Details  Home safety, HEP,     Person(s) Educated  Patient    Methods  Explanation    Comprehension  Verbalized understanding       PT Short Term Goals - 06/05/17 1457      PT SHORT TERM GOAL #1   Title  Pt to be independent with HEP for LE strength    Time  2    Period  Weeks    Status  Achieved        PT Long Term Goals - 06/05/17  [place] #1   Title  Pt to demo increased strength of B LE's to be at least 4+/5 to improve stability    Time  6    Period  Weeks    Status  Achieved      PT LONG TERM GOAL #2    Title  Pt to demo increase in score of BERG by at least 5 points     Time  6    Period  Weeks    Status  Achieved      PT LONG TERM GOAL #3   Title  Pt to demo improved score on FGA by at least 5 points     Time  6    Period  Weeks    Status  Achieved      PT LONG TERM GOAL #4   Title  Pt to demo ability for ambulation on indoor and outdoor surfaces, with balance to be WFL (good) , to decrease fall risk with community navigation    Time  6    Period  Weeks    Status  Achieved            Plan - 06/05/17 1459    Clinical Impression Statement  Pt has met goals for PT, with improvements in all sores on BERG, DGI, and TUG. She has showed improved stability with dynamic mobility, requiring less cuing for safety, and with less sway and LOB. She is independent with final HEP for balance . She did have one fall during PT course (at home) , but pt with no LOB or evidence of imbalance during testing in clinic. Pt ready for d/c to HEP, pt in agreement with plan.     Rehab Potential  Good    PT Frequency  2x / week    PT Duration  6 weeks    PT Treatment/Interventions  ADLs/Self Care Home Management;Cryotherapy;Electrical Stimulation;Iontophoresis 38m/ml Dexamethasone;Functional mobility training;Stair training;Gait training;Ultrasound;Moist Heat;Therapeutic activities;Therapeutic exercise;Balance training;Neuromuscular re-education;Patient/family education;Orthotic Fit/Training;Passive range of motion;Dry needling;Manual techniques    Consulted and Agree with Plan of Care  Patient       Patient will benefit from skilled therapeutic intervention in order to improve the following deficits and impairments:  Abnormal gait, Decreased endurance, Decreased activity tolerance, Decreased strength, Pain, Decreased balance, Decreased mobility, Difficulty walking, Improper body mechanics, Impaired perceived functional ability, Decreased coordination, Decreased safety awareness  Visit Diagnosis: Other  abnormalities of gait and mobility  Frequent falls  Right shoulder pain, unspecified chronicity     Problem List Patient Active Problem List   Diagnosis Date Noted  . Tremor 02/24/2015  . Gait disorder 02/24/2015  . PCP NOTES >>>>>>> 12/13/2014  . Abdominal wall strain 03/19/2012  . Osteopenia 10/04/2011  . Annual physical exam 09/07/2010  . GERD 08/28/2009  . Anxiety and depression 10/06/2006  . Essential hypertension 10/06/2006  . BREAST CANCER, HX OF 10/06/2006    LLyndee Hensen PT, DPT 3:01 PM  06/05/17    [facility] [place] [address]Phone: [phone]  Fax:  [phone] Name: Monique BuschmanMRN: [id number] of Birth: [birth year]  PHYSICAL THERAPY DISCHARGE SUMMARY  Visits from Start of Care: 11  Plan: Patient agrees to discharge.  Patient goals were met. Patient is being discharged due to meeting the stated rehab goals.  ?????     LLyndee Hensen PT, DPT 3:01 PM  06/05/17

## 2017-06-05 NOTE — Therapy (Signed)
[facility] [address] [place], [place], [address] Phone: [phone]   Fax:  [phone]  Physical Therapy Treatment  Patient Details  Name: Monique Ballard MRN: [id number] Date of Birth: 07/26/1940 Referring Provider: Larose Kells   Encounter Date: 05/29/2017  PT End of Session - 06/05/17 1428    Visit Number  10    Number of Visits  12    Date for PT Re-Evaluation  06/10/17    Authorization Type  Medicare    PT Start Time  1432    PT Stop Time  1515    PT Time Calculation (min)  43 min    Equipment Utilized During Treatment  Gait belt    Activity Tolerance  Patient tolerated treatment well    Behavior During Therapy  [facility] for tasks assessed/performed       Past Medical History:  Diagnosis Date  . Breast cancer ([place]) fall 1997    Left Lumpectomy, XRT.  treated with Tamoxifem and Evista  . Depression   . Endometriosis in her 20's   surgery to help clear endometriosis to obtain a pregnancy.  Marland Kitchen GERD (gastroesophageal reflux disease)    w/"stretching" remotely  . HTN (hypertension)     Past Surgical History:  Procedure Laterality Date  . BREAST LUMPECTOMY Left fall 1997   Lymph nodes X 3 were negative, ER+, Radiation treatmetn.  Took Tamoxifem X 5 yrs then Evista for 3-5 yrs.  . CESAREAN SECTION     x2  first pregnancy breach and second normal  . PELVIC FRACTURE SURGERY     endometriosis, remotely    There were no vitals filed for this visit.  Subjective Assessment - 06/05/17 1428    Subjective  Pt states that she is a little "off " today , she is tired, didnt sleep well.     Currently in Pain?  No/denies                       [facility] Adult PT Treatment/Exercise - 06/05/17 1439      Exercises   Exercises  Knee/Hip      Knee/Hip Exercises: Aerobic   Stationary Bike  L1 x 8 min      Knee/Hip Exercises: Standing   Stairs  up/down 5 steps x 4 with 1 hand rail:     Other Standing Knee Exercises  March/walk  14ft x4 hand at wall;   Toe taps on 6 in step x20;     Other Standing Knee Exercises   corner balance:   with head turns L/R and Up/down 3x 10 ; Weight shifts A/P x30 ; Tandem stance 30 sec x2 bil; Staggered stance weight shifts x20 bil;       Knee/Hip Exercises: Seated   Long Arc Quad  2 sets;10 reps;Both    Long Arc Quad Weight  2 lbs.      Knee/Hip Exercises: Sidelying   Hip ABduction  20 reps;Both               PT Short Term Goals - 04/30/17 1522      PT SHORT TERM GOAL #1   Title  Pt to be independent with HEP for LE strength    Time  2    Period  Weeks    Status  New    Target Date  05/14/17        PT Long Term Goals - 04/30/17 1528      PT LONG  TERM GOAL #1   Title  Pt to demo increased strength of B LE's to be at least 4+/5 to improve stability    Time  6    Period  Weeks    Status  New    Target Date  06/10/17      PT LONG TERM GOAL #2   Title  Pt to demo increase in score of BERG by at least 5 points     Time  6    Period  Weeks    Status  New    Target Date  06/10/17      PT LONG TERM GOAL #3   Title  Pt to demo improved score on FGA by at least 5 points     Time  6    Period  Weeks    Status  New    Target Date  06/10/17      PT LONG TERM GOAL #4   Title  Pt to demo ability for ambulation on indoor and outdoor surfaces, with balance to be WFL (good) , to decrease fall risk with community navigation    Time  6    Period  Weeks    Status  New    Target Date  06/10/17            Plan - 06/05/17 1429    Clinical Impression Statement  Pt requires more seated rest breaks today, due to mild fatigue from not sleeping well. She is showing improvments with all balance exercises, able to perform without contact guard. Demonstrates safety with corner balance reviewed today, to perform at home for HEP. Plan to d/c next visit, will re-assess balance tests next visit.     Rehab Potential  Good    PT Frequency  2x / week    PT Duration  6 weeks     PT Treatment/Interventions  ADLs/Self Care Home Management;Cryotherapy;Electrical Stimulation;Iontophoresis 4mg /ml Dexamethasone;Functional mobility training;Stair training;Gait training;Ultrasound;Moist Heat;Therapeutic activities;Therapeutic exercise;Balance training;Neuromuscular re-education;Patient/family education;Orthotic Fit/Training;Passive range of motion;Dry needling;Manual techniques    Consulted and Agree with Plan of Care  Patient       Patient will benefit from skilled therapeutic intervention in order to improve the following deficits and impairments:  Abnormal gait, Decreased endurance, Decreased activity tolerance, Decreased strength, Pain, Decreased balance, Decreased mobility, Difficulty walking, Improper body mechanics, Impaired perceived functional ability, Decreased coordination, Decreased safety awareness  Visit Diagnosis: Other abnormalities of gait and mobility  Frequent falls     Problem List Patient Active Problem List   Diagnosis Date Noted  . Tremor 02/24/2015  . Gait disorder 02/24/2015  . PCP NOTES >>>>>>> 12/13/2014  . Abdominal wall strain 03/19/2012  . Osteopenia 10/04/2011  . Annual physical exam 09/07/2010  . GERD 08/28/2009  . Anxiety and depression 10/06/2006  . Essential hypertension 10/06/2006  . BREAST CANCER, HX OF 10/06/2006    Lyndee Hensen 06/05/2017, 2:41 PM  [place], [place], [address] Phone: [phone]   Fax:  [phone]  Name: Monique Ballard MRN: [id number] Date of Birth: Jan 22, 1940

## 2017-07-01 ENCOUNTER — Encounter: Payer: Self-pay | Admitting: Sports Medicine

## 2017-07-01 ENCOUNTER — Ambulatory Visit (INDEPENDENT_AMBULATORY_CARE_PROVIDER_SITE_OTHER): Payer: Medicare Other | Admitting: Sports Medicine

## 2017-07-01 VITALS — BP 120/80 | HR 84 | Ht 62.0 in | Wt 164.8 lb

## 2017-07-01 DIAGNOSIS — R296 Repeated falls: Secondary | ICD-10-CM | POA: Diagnosis not present

## 2017-07-01 DIAGNOSIS — M25511 Pain in right shoulder: Secondary | ICD-10-CM | POA: Diagnosis not present

## 2017-07-01 DIAGNOSIS — M19011 Primary osteoarthritis, right shoulder: Secondary | ICD-10-CM | POA: Diagnosis not present

## 2017-07-01 NOTE — Progress Notes (Signed)
  Monique Ballard at [facility] [phone]  Monique Ballard - 77 y.o. female MRN [id number]  Date of birth: 1940/11/20  Visit Date: 07/01/2017  PCP: Colon Branch, MD   Referred by: Colon Branch, MD  Scribe(s) for today's visit: Josepha Pigg, CMA  SUBJECTIVE:  Monique Shell "Arlana" is here for Follow-up (R shoulder)   05/20/2017: Her R shoulder pain symptoms INITIALLY: Began the end of December and began after reaching back in the car and lifting a heavy object. Pain is anterior.  Described as moderate grabbing pain, nonradiating Worsened with reaching down and back (to fasten bra). Also worse when reaching out and when reaching up.  Improved with rest Additional associated symptoms include: She denies neck pain, n/t in arm or hand.    At this time symptoms are improving compared to onset  She has been using Salonpas with minimal relief. She has used IcyHot with some relief.   07/01/2017: Compared to the last office visit, her previously described symptoms are improving, ROM has improved. She does still have some pain when reaching across her body and when reaching back but not as bad as before.  Current symptoms are mild & are non-radiating. She has been doing HEP with no trouble and feels that the program has been beneficial. She is no longer using Salonpas or IcyHot She received shoulder injection (depo medrol 40 mg) 05/20/2017 and responded well.    REVIEW OF SYSTEMS: Denies night time disturbances. Denies fevers, chills, or night sweats. Denies unexplained weight loss. Reports personal history of cancer, L breast. Denies changes in bowel or bladder habits. Denies recent unreported falls. Denies new or worsening dyspnea or wheezing. Denies headaches or dizziness.  Denies numbness, tingling or weakness  In the extremities.  Denies dizziness or presyncopal episodes Denies lower extremity edema      Please see  additional documentation for Objective, Assessment and Plan sections. Pertinent additional documentation may be included in corresponding procedure notes, imaging studies, problem based documentation and patient instructions. Please see these sections of the encounter for additional information regarding this visit.  CMA/ATC served as Education administrator during this visit. History, Physical, and Plan performed by medical provider. Documentation and orders reviewed and attested to.      Gerda Diss, [place] Sports [facility] Physician

## 2017-07-01 NOTE — Progress Notes (Signed)
Monique Ballard. Monique Ballard, [place] at [facility] [phone]  Monique Ballard - 77 y.o. female MRN [id number]  Date of birth: 1940-10-06  Visit Date:   PCP: Colon Branch, MD   Referred by: Colon Branch, MD  HISTORY & PERTINENT PRIOR DATA:  Significant/pertinent history, findings, studies include:  reports that she has quit smoking. She has never used smokeless tobacco. No results for input(s): HGBA1C, LABURIC, CREATINE in the last 8760 hours. No specialty comments available. No problems updated. Past Medical History:  Diagnosis Date  . Breast cancer ([place]) fall 1997    Left Lumpectomy, XRT.  treated with Tamoxifem and Evista  . Depression   . Endometriosis in her 20's   surgery to help clear endometriosis to obtain a pregnancy.  Marland Kitchen GERD (gastroesophageal reflux disease)    w/"stretching" remotely  . HTN (hypertension)    Past Surgical History:  Procedure Laterality Date  . BREAST LUMPECTOMY Left fall 1997   Lymph nodes X 3 were negative, ER+, Radiation treatmetn.  Took Tamoxifem X 5 yrs then Evista for 3-5 yrs.  . CESAREAN SECTION     x2  first pregnancy breach and second normal  . PELVIC FRACTURE SURGERY     endometriosis, remotely   family history includes Heart attack in her father; Heart disease in her father; Hypertension in her mother; Stroke in her mother; Throat cancer in her other. There is no history of Breast cancer, Colon cancer, or Diabetes. Social History   Occupational History  . Occupation: retired  Tobacco Use  . Smoking status: Former Research scientist (life sciences)  . Smokeless tobacco: Never Used  . Tobacco comment: Quit >30 yrs ago  Substance and Sexual Activity  . Alcohol use: Yes    Comment: 1 glass wine dialy  . Drug use: No  . Sexual activity: Never    Birth control/protection: None   Current Outpatient Medications on File Prior to Visit  Medication Sig Dispense Refill  . calcium-vitamin D (OSCAL 500/200 D-3) 500-200 MG-UNIT  per tablet Take 1 tablet by mouth daily.      . clonazePAM (KLONOPIN) 0.5 MG tablet Take 1 tablet (0.5 mg total) by mouth 2 (two) times daily as needed for anxiety. 60 tablet 2  . Multiple Vitamins-Minerals (CENTRUM SILVER PO) Take 1 each by mouth daily.      . Omega-3 Fatty Acids (FISH OIL) 1200 MG CAPS Take 1 capsule by mouth daily.     . pantoprazole (PROTONIX) 40 MG tablet Take 1 tablet (40 mg total) by mouth daily. 90 tablet 3  . Turmeric 500 MG CAPS Take 2 capsules by mouth daily.     No current facility-administered medications on file prior to visit.    Allergies  Allergen Reactions  . Penicillins Rash    Has patient had a PCN reaction causing immediate rash, facial/tongue/throat swelling, SOB or lightheadedness with hypotension: Yes Has patient had a PCN reaction causing severe rash involving mucus membranes or skin necrosis: No Has patient had a PCN reaction that required hospitalization No Has patient had a PCN reaction occurring within the last 10 years: No If all of the above answers are "NO", then may proceed with Cephalosporin use.      OBJECTIVE:  VS:  HT:5\' 2"  (157.5 cm)   WT:164 lb 12.8 oz (74.8 kg)  BMI:30.13    TEMP: ( ) BP:120/80  HR:84bpm  RESP:96 %  PHYSICAL EXAM: WDWN, Non-toxic appearing. Psychiatric: Alert & appropriately interactive.  Not depressed or anxious appearing. Respiratory: No increased work of breathing.  Trachea Midline Eyes: Pupils are equal.  EOM intact without nystagmus.  No scleral icterus  Vascular Exam: warm to touch no edema  upper extremity neuro exam: unremarkable  MSK Exam: Right shoulder is overall well aligned.  She has limited overhead range of motion.  Pain with axial load and circumduction with small amount of crepitation.  She has limited abduction and forward flexion limited mainly due to pain.  Positive Hawkins and Neer's.  Intrinsic rotator cuff strength is intact    ASSESSMENT & PLAN:  1. Primary osteoarthritis  of right shoulder   2. Frequent falls   3. Right shoulder pain, unspecified chronicity     PLAN:  doing well but has persistent limitations.  Gait disturbance does increase her risk of recurrent injury and we discussed appropriate safety and encouraged to continued vigilance  Continue previously prescribed HEP.  ADD WALL AND TABLE CRAWLS with both flexion and abduction    Follow-up: Return if symptoms worsen or fail to improve.    Pertinent documentation may be included in additional procedure notes, imaging studies, problem based documentation and patient instructions. Please see these sections of the encounter for additional information regarding this visit.      Gerda Diss, [place] Sports Medicine Physician

## 2017-07-08 ENCOUNTER — Other Ambulatory Visit: Payer: Self-pay | Admitting: Internal Medicine

## 2017-07-11 ENCOUNTER — Other Ambulatory Visit: Payer: Self-pay | Admitting: Internal Medicine

## 2017-07-15 ENCOUNTER — Encounter: Payer: Self-pay | Admitting: Sports Medicine

## 2017-08-05 ENCOUNTER — Ambulatory Visit: Payer: Medicare Other | Admitting: Nurse Practitioner

## 2017-08-22 ENCOUNTER — Encounter: Payer: Self-pay | Admitting: Internal Medicine

## 2017-08-22 ENCOUNTER — Ambulatory Visit (INDEPENDENT_AMBULATORY_CARE_PROVIDER_SITE_OTHER): Payer: Medicare Other | Admitting: Internal Medicine

## 2017-08-22 VITALS — BP 110/78 | HR 101 | Temp 98.1°F | Resp 16 | Ht 62.0 in | Wt 160.0 lb

## 2017-08-22 DIAGNOSIS — F419 Anxiety disorder, unspecified: Secondary | ICD-10-CM

## 2017-08-22 DIAGNOSIS — R269 Unspecified abnormalities of gait and mobility: Secondary | ICD-10-CM

## 2017-08-22 DIAGNOSIS — F329 Major depressive disorder, single episode, unspecified: Secondary | ICD-10-CM

## 2017-08-22 DIAGNOSIS — R945 Abnormal results of liver function studies: Secondary | ICD-10-CM | POA: Diagnosis not present

## 2017-08-22 DIAGNOSIS — R7989 Other specified abnormal findings of blood chemistry: Secondary | ICD-10-CM

## 2017-08-22 LAB — HEPATIC FUNCTION PANEL
ALBUMIN: 3.9 g/dL (ref 3.5–5.2)
ALK PHOS: 71 U/L (ref 39–117)
ALT: 25 U/L (ref 0–35)
AST: 41 U/L — ABNORMAL HIGH (ref 0–37)
BILIRUBIN TOTAL: 0.6 mg/dL (ref 0.2–1.2)
Bilirubin, Direct: 0.2 mg/dL (ref 0.0–0.3)
Total Protein: 6.9 g/dL (ref 6.0–8.3)

## 2017-08-22 NOTE — Patient Instructions (Signed)
GO TO THE LAB : Get the blood work     GO TO THE FRONT DESK Schedule your next appointment for a  YEARLY CHECK IN 4 MONTHS, FASTING

## 2017-08-22 NOTE — Progress Notes (Signed)
Subjective:    Patient ID: Monique Ballard, female    DOB: May 20, 1940, 77 y.o.   MRN: [id number]  DOS:  08/22/2017 Type of visit - description : Follow-up Interval history: Since the last visit, she continue with Cymbalta, clonazepam, has seen a counselor which is helping. Increased LFTs: Due to recheck, still drinks wine at least one and sometimes 2 glasses a night. Gait: Doing physical therapy, no falls since she started the program.  Thinks is helping  Review of Systems   Past Medical History:  Diagnosis Date  . Breast cancer ([place]) fall 1997    Left Lumpectomy, XRT.  treated with Tamoxifem and Evista  . Depression   . Endometriosis in her 20's   surgery to help clear endometriosis to obtain a pregnancy.  Marland Kitchen GERD (gastroesophageal reflux disease)    w/"stretching" remotely  . HTN (hypertension)     Past Surgical History:  Procedure Laterality Date  . BREAST LUMPECTOMY Left fall 1997   Lymph nodes X 3 were negative, ER+, Radiation treatmetn.  Took Tamoxifem X 5 yrs then Evista for 3-5 yrs.  . CESAREAN SECTION     x2  first pregnancy breach and second normal  . PELVIC FRACTURE SURGERY     endometriosis, remotely    Social History   Socioeconomic History  . Marital status: Married    Spouse name: Not on file  . Number of children: 2  . Years of education: Not on file  . Highest education level: Not on file  Occupational History  . Occupation: retired  Scientific laboratory technician  . Financial resource strain: Not on file  . Food insecurity:    Worry: Not on file    Inability: Not on file  . Transportation needs:    Medical: Not on file    Non-medical: Not on file  Tobacco Use  . Smoking status: Former Research scientist (life sciences)  . Smokeless tobacco: Never Used  . Tobacco comment: Quit >30 yrs ago  Substance and Sexual Activity  . Alcohol use: Yes    Comment: 1 glass wine dialy  . Drug use: No  . Sexual activity: Never    Birth control/protection: None  Lifestyle  . Physical activity:   Days per week: Not on file    Minutes per session: Not on file  . Stress: Not on file  Relationships  . Social connections:    Talks on phone: Not on file    Gets together: Not on file    Attends religious service: Not on file    Active member of club or organization: Not on file    Attends meetings of clubs or organizations: Not on file    Relationship status: Not on file  . Intimate partner violence:    Fear of current or ex partner: Not on file    Emotionally abused: Not on file    Physically abused: Not on file    Forced sexual activity: Not on file  Other Topics Concern  . 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   Son lives with her as of 12/2016   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, she is a Community education officer, lives in [place], has 2 children         Allergies as of 08/22/2017      Reactions   Penicillins Rash   Has patient had a PCN reaction causing immediate rash, facial/tongue/throat  swelling, SOB or lightheadedness with hypotension: Yes Has patient had a PCN reaction causing severe rash involving mucus membranes or skin necrosis: No Has patient had a PCN reaction that required hospitalization No Has patient had a PCN reaction occurring within the last 10 years: No If all of the above answers are "NO", then may proceed with Cephalosporin use.      Medication List        Accurate as of 08/22/17 11:59 PM. Always use your most recent med list.          CENTRUM SILVER PO Take 1 each by mouth daily.   clonazePAM 0.5 MG tablet Commonly known as:  KLONOPIN Take 1 tablet (0.5 mg total) by mouth 2 (two) times daily as needed for anxiety.   DULoxetine 60 MG capsule Commonly known as:  CYMBALTA Take 1 capsule (60 mg total) by mouth 2 (two) times daily.   metoprolol tartrate 50 MG tablet Commonly known as:  LOPRESSOR Take 1 tablet (50 mg total) by mouth 2 (two) times daily.     OSCAL 500/200 D-3 500-200 MG-UNIT tablet Generic drug:  calcium-vitamin D Take 1 tablet by mouth daily.   pantoprazole 40 MG tablet Commonly known as:  PROTONIX Take 1 tablet (40 mg total) by mouth daily.          Objective:   Physical Exam BP 110/78 (BP Location: Right Arm, Patient Position: Sitting, Cuff Size: Normal)   Pulse (!) 101   Temp 98.1 F (36.7 C) (Oral)   Resp 16   Ht 5\' 2"  (1.575 m)   Wt 160 lb (72.6 kg)   LMP 01/22/1992 (Within Months)   SpO2 98%   BMI 29.26 kg/m  General:   Well developed, NAD, see BMI.  HEENT:  Normocephalic . Face symmetric, atraumatic Skin: Not pale. Not jaundice Neurologic:  alert & oriented X3.  Speech normal, gait appropriate for age and unassisted Psych--  Cognition and judgment appear intact.  Cooperative with normal attention span and concentration.  Behavior appropriate. No anxious or depressed appearing.      Assessment & Plan:   Assessment HTN Depression, Anxiety Tremor dx 11-2014 GERD with esophageal stricture L Breast cancer, 1997, lumpectomy, XRT, released from oncology  PLAN:  Depression, anxiety: Since the last visit she is a stable, on Cymbalta, was taking clonazepam 0.5-1 but now has increase it a little to 1-1 with good control of her symptoms.  She is doing counseling.  Listening therapy provided.  She also wonders about CBD oil, since she is doing okay I don't see the need to introduce another chemical on her system. Overall her symptoms are better, not 100% better but states that she will feel better as soon as her son situation continue improving. Increased LFTs: Recheck today, she still gets 1 or 2 glasses of wine at night. Unsteadiness, gait disorder: s/p 12 PT visits, doing self PT now, feels a stronger We talked about OTC  supplements that she takes, recommend to stay on only  calcium, vitamin D and a multivitamin. RTC 4 months, yearly checkup, fasting

## 2017-08-23 NOTE — Assessment & Plan Note (Signed)
Depression, anxiety: Since the last visit she is a stable, on Cymbalta, was taking clonazepam 0.5-1 but now has increase it a little to 1-1 with good control of her symptoms.  She is doing counseling.  Listening therapy provided.  She also wonders about CBD oil, since she is doing okay I don't see the need to introduce another chemical on her system. Overall her symptoms are better, not 100% better but states that she will feel better as soon as her son situation continue improving. Increased LFTs: Recheck today, she still gets 1 or 2 glasses of wine at night. Unsteadiness, gait disorder: s/p 12 PT visits, doing self PT now, feels a stronger We talked about OTC  supplements that she takes, recommend to stay on only  calcium, vitamin D and a multivitamin. RTC 4 months, yearly checkup, fasting

## 2017-09-03 ENCOUNTER — Encounter: Payer: Self-pay | Admitting: Internal Medicine

## 2017-09-03 NOTE — Telephone Encounter (Signed)
Will defer to PCP

## 2017-09-09 ENCOUNTER — Other Ambulatory Visit: Payer: Self-pay | Admitting: Internal Medicine

## 2017-09-09 MED ORDER — ALPRAZOLAM 0.25 MG PO TABS: 0.2500 mg | ORAL_TABLET | Freq: Two times a day (BID) | ORAL | 0 refills | Status: DC | PRN

## 2017-10-08 ENCOUNTER — Telehealth: Payer: Self-pay | Admitting: Internal Medicine

## 2017-10-09 NOTE — Telephone Encounter (Signed)
Sent!

## 2017-10-09 NOTE — Telephone Encounter (Signed)
Pt is requesting refill on alprazolam.   Last OV: 08/22/2017 Last Fill: 09/09/2017 #60 and 0RF UDS: 04/21/2017 Low risk  NCCR printed (in name's Quentin Ore and Briscoe Deutscher)- no discrepancies noted- sent for scanning

## 2017-10-23 ENCOUNTER — Encounter: Payer: Self-pay | Admitting: Internal Medicine

## 2017-12-09 ENCOUNTER — Telehealth: Payer: Self-pay | Admitting: Internal Medicine

## 2017-12-10 NOTE — Telephone Encounter (Signed)
Pt is requesting refill on alprazolam.   Last OV: 08/22/2017 Last Fill: 10/09/2017 #60 and 1RF UDS: 04/21/2017 Low risk  NCCR in media from 10/15/2017  On 10/23/2017 MyChart messages you recommended she see psychiatry.

## 2017-12-10 NOTE — Telephone Encounter (Signed)
Okay to refill Xanax, until she sees psychiatry.

## 2018-01-01 ENCOUNTER — Encounter: Payer: Self-pay | Admitting: Internal Medicine

## 2018-01-01 ENCOUNTER — Ambulatory Visit (INDEPENDENT_AMBULATORY_CARE_PROVIDER_SITE_OTHER): Payer: Medicare Other | Admitting: Internal Medicine

## 2018-01-01 VITALS — BP 108/72 | HR 72 | Temp 98.0°F | Resp 16 | Ht 62.0 in | Wt 159.5 lb

## 2018-01-01 DIAGNOSIS — F419 Anxiety disorder, unspecified: Secondary | ICD-10-CM | POA: Diagnosis not present

## 2018-01-01 DIAGNOSIS — F329 Major depressive disorder, single episode, unspecified: Secondary | ICD-10-CM | POA: Diagnosis not present

## 2018-01-01 DIAGNOSIS — Z1231 Encounter for screening mammogram for malignant neoplasm of breast: Secondary | ICD-10-CM

## 2018-01-01 DIAGNOSIS — Z23 Encounter for immunization: Secondary | ICD-10-CM

## 2018-01-01 DIAGNOSIS — I10 Essential (primary) hypertension: Secondary | ICD-10-CM

## 2018-01-01 DIAGNOSIS — Z853 Personal history of malignant neoplasm of breast: Secondary | ICD-10-CM | POA: Diagnosis not present

## 2018-01-01 LAB — BASIC METABOLIC PANEL
BUN: 7 mg/dL (ref 6–23)
CO2: 30 mEq/L (ref 19–32)
Calcium: 9.4 mg/dL (ref 8.4–10.5)
Chloride: 100 mEq/L (ref 96–112)
Creatinine, Ser: 0.64 mg/dL (ref 0.40–1.20)
GFR: 96.16 mL/min (ref >60.00)
Glucose, Bld: 134 mg/dL — ABNORMAL HIGH (ref 70–99)
Potassium: 5 mEq/L (ref 3.5–5.1)
Sodium: 135 mEq/L (ref 135–145)

## 2018-01-01 LAB — LIPID PANEL
Cholesterol: 161 mg/dL (ref 0–200)
HDL: 47.7 mg/dL (ref >39.00)
LDL Cholesterol: 74 mg/dL (ref 0–99)
NonHDL: 113.02
Total CHOL/HDL Ratio: 3
Triglycerides: 197 mg/dL — ABNORMAL HIGH (ref 0.0–149.0)
VLDL: 39.4 mg/dL (ref 0.0–40.0)

## 2018-01-01 MED ORDER — ESCITALOPRAM OXALATE 10 MG PO TABS: 10.0000 mg | ORAL_TABLET | Freq: Every day | ORAL | 3 refills | Status: DC

## 2018-01-01 MED ORDER — ZOSTER VAC RECOMB ADJUVANTED 50 MCG/0.5ML IM SUSR: 0.5000 mL | Freq: Once | INTRAMUSCULAR | 1 refills | Status: AC

## 2018-01-01 NOTE — Progress Notes (Signed)
Pre visit review using our clinic review tool, if applicable. No additional management support is needed unless otherwise documented below in the visit note. 

## 2018-01-01 NOTE — Patient Instructions (Addendum)
Please schedule Medicare Wellness with Glenard Haring.   GO TO THE LAB : Get the blood work      GO TO THE FRONT DESK Schedule your next appointment for a checkup in 4 weeks  Start Lexapro 1 tablet daily  Decrease Cymbalta 60 mg: 1 tablet a day only for 10 days Then 1 tablet every other day until you see me  Watch for possible side effects, call if severe.   See list of counselors, also consider: Dr Thomasene Lot

## 2018-01-01 NOTE — Progress Notes (Signed)
Subjective:    Patient ID: Monique Ballard, female    DOB: July 30, 1940, 77 y.o.   MRN: [id number]  DOS:  01/01/2018 Type of visit - description : f/u Anxiety depression: Good compliance with medication, feeling about the same, again mood is not as well-controlled as she would like it to be.  Ready to make some changes. Still drinks 2 to 3 glasses of wine a night. Unsteadiness: Remains improved after PT   Review of Systems  Denies chest pain or difficulty breathing No nausea or vomiting.  Occasional diarrhea without blood in the stools. No suicidal ideas  Past Medical History:  Diagnosis Date  . Breast cancer ([place]) fall 1997    Left Lumpectomy, XRT.  treated with Tamoxifem and Evista  . Depression   . Endometriosis in her 20's   surgery to help clear endometriosis to obtain a pregnancy.  Marland Kitchen GERD (gastroesophageal reflux disease)    w/"stretching" remotely  . HTN (hypertension)     Past Surgical History:  Procedure Laterality Date  . BREAST LUMPECTOMY Left fall 1997   Lymph nodes X 3 were negative, ER+, Radiation treatmetn.  Took Tamoxifem X 5 yrs then Evista for 3-5 yrs.  . CESAREAN SECTION     x2  first pregnancy breach and second normal  . PELVIC FRACTURE SURGERY     endometriosis, remotely    Social History   Socioeconomic History  . Marital status: Married    Spouse name: Not on file  . Number of children: 2  . Years of education: Not on file  . Highest education level: Not on file  Occupational History  . Occupation: retired  Scientific laboratory technician  . Financial resource strain: Not on file  . Food insecurity:    Worry: Not on file    Inability: Not on file  . Transportation needs:    Medical: Not on file    Non-medical: Not on file  Tobacco Use  . Smoking status: Former Research scientist (life sciences)  . Smokeless tobacco: Never Used  . Tobacco comment: Quit >30 yrs ago  Substance and Sexual Activity  . Alcohol use: Yes    Comment: 1 glass wine dialy  . Drug use: No  . Sexual  activity: Never    Birth control/protection: None  Lifestyle  . Physical activity:    Days per week: Not on file    Minutes per session: Not on file  . Stress: Not on file  Relationships  . Social connections:    Talks on phone: Not on file    Gets together: Not on file    Attends religious service: Not on file    Active member of club or organization: Not on file    Attends meetings of clubs or organizations: Not on file    Relationship status: Not on file  . Intimate partner violence:    Fear of current or ex partner: Not on file    Emotionally abused: Not on file    Physically abused: Not on file    Forced sexual activity: Not on file  Other Topics Concern  . 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      Medication List       Accurate as of January 01, 2018 11:59 PM. Always use your most recent med list.        ALPRAZolam 0.25 MG tablet Commonly known as:  XANAX TAKE ONE OR TWO TABLETS BY MOUTH TWICE DAILY AS NEEDED FOR ANXIETY   CENTRUM SILVER PO Take 1 each by mouth daily.   escitalopram 10 MG tablet Commonly known as:  LEXAPRO Take 1 tablet (10 mg total) by mouth daily.   metoprolol tartrate 50 MG tablet Commonly known as:   LOPRESSOR Take 1 tablet (50 mg total) by mouth 2 (two) times daily.   OSCAL 500/200 D-3 500-200 MG-UNIT tablet Generic drug:  calcium-vitamin D Take 1 tablet by mouth daily.   pantoprazole 40 MG tablet Commonly known as:  PROTONIX Take 1 tablet (40 mg total) by mouth daily.   Zoster Vaccine Adjuvanted injection Commonly known as:  SHINGRIX Inject 0.5 mLs into the muscle once for 1 dose.           Objective:   Physical Exam BP 108/72 (BP Location: Left Arm, Patient Position: Sitting, Cuff Size: Small)   Pulse 72   Temp 98 F (36.7 C) (Oral)   Resp 16   Ht 5\' 2"  (1.575 m)   Wt 159 lb 8 oz (72.3 kg)   LMP 01/22/1992 (Within Months)   SpO2 90%   BMI 29.17 kg/m   General:   Well developed, NAD, BMI noted.  HEENT:  Normocephalic . Face symmetric, atraumatic Lungs:  CTA B Normal respiratory effort, no intercostal retractions, no accessory muscle use. Heart: RRR,  no murmur.  no pretibial edema bilaterally  Abdomen:  Not distended, soft, non-tender. No rebound or rigidity.   Skin: Not pale. Not jaundice Neurologic:  alert & oriented X3.  Speech normal, gait appropriate for age and unassisted Psych--  Cognition and judgment appear intact.  Cooperative with normal attention span and concentration.  Behavior appropriate. No anxious or depressed appearing.  Assessment & Plan:     Assessment HTN Depression, Anxiety Tremor dx 11-2014 GERD with esophageal stricture L Breast cancer, 1997, lumpectomy, XRT, released from oncology  PLAN:  Anxiety depression: Unchanged from the last visit, not as well-controlled as she would like to be. On chart review, up to 2017 she was on citalopram, then Wellbutrin was added, it worked well for a while but then it stopped working, that is when she was switch to Cymbalta. For a while she was switch to sertraline but then decided that Cymbalta was better so she went back on it. Currently on Cymbalta and Xanax. At this point, she  is ready to make some changes until she establishes with a psychiatrist as per my recommendation, she also needs counseling . Plan: Wean off Cymbalta Restart SSRIs, will try Lexapro 10 mg daily.  Follow-up in 4 weeks, possibly increase it to 20 mg and add Wellbutrin. Get established with psychiatrist and counselor as soon as she can. HTN: Controlled, check a BMP and FLP.  Continue  metoprolol. Preventive care: Discussed RTC 4 months.   Today, I spent more than 28   min with the patient: >50% of the time counseling regards anxiety, depression, benefit of counseling, need to see a psychiatrist for further medication management.  Also reviewed the chart and provided some counseling myself.

## 2018-01-01 NOTE — Assessment & Plan Note (Addendum)
Td 8- 2010, pneumonia shot 2011, prevnar 2016 shingles shot 2011 Flu shot today  Colonoscopy:  11/22/2002, May 2015, negative, 10 years ([facility]) Cervical ca screening: no h/o abnormal paps, married once, pt elected no further paps  Mammogram due , ordered

## 2018-01-02 NOTE — Assessment & Plan Note (Signed)
Anxiety depression: Unchanged from the last visit, not as well-controlled as she would like to be. On chart review, up to 2017 she was on citalopram, then Wellbutrin was added, it worked well for a while but then it stopped working, that is when she was switch to Cymbalta. For a while she was switch to sertraline but then decided that Cymbalta was better so she went back on it. Currently on Cymbalta and Xanax. At this point, she is ready to make some changes until she establishes with a psychiatrist as per my recommendation, she also needs counseling . Plan: Wean off Cymbalta Restart SSRIs, will try Lexapro 10 mg daily.  Follow-up in 4 weeks, possibly increase it to 20 mg and add Wellbutrin. Get established with psychiatrist and counselor as soon as she can. HTN: Controlled, check a BMP and FLP.  Continue metoprolol. Preventive care: Discussed RTC 4 months.

## 2018-01-12 ENCOUNTER — Telehealth: Payer: Self-pay | Admitting: Internal Medicine

## 2018-01-13 NOTE — Telephone Encounter (Signed)
Pt is requesting refill on alprazolam.   Last OV: 01/01/2018 Last Fill: 12/10/2017 #60 and 0RF UDS: 04/21/2017 Low risk  NCCR in media from 10/15/2017

## 2018-01-13 NOTE — Telephone Encounter (Signed)
Sent!

## 2018-01-16 ENCOUNTER — Encounter: Payer: Self-pay | Admitting: Internal Medicine

## 2018-01-18 ENCOUNTER — Encounter: Payer: Self-pay | Admitting: Internal Medicine

## 2018-01-29 ENCOUNTER — Encounter: Payer: Self-pay | Admitting: Internal Medicine

## 2018-01-29 ENCOUNTER — Ambulatory Visit (INDEPENDENT_AMBULATORY_CARE_PROVIDER_SITE_OTHER): Payer: Medicare Other | Admitting: Internal Medicine

## 2018-01-29 VITALS — BP 124/68 | HR 77 | Temp 97.8°F | Resp 16 | Ht 62.0 in | Wt 161.2 lb

## 2018-01-29 DIAGNOSIS — F329 Major depressive disorder, single episode, unspecified: Secondary | ICD-10-CM

## 2018-01-29 DIAGNOSIS — F419 Anxiety disorder, unspecified: Secondary | ICD-10-CM

## 2018-01-29 MED ORDER — ESCITALOPRAM OXALATE 20 MG PO TABS: 20.0000 mg | ORAL_TABLET | Freq: Every day | ORAL | 1 refills | Status: DC

## 2018-01-29 NOTE — Progress Notes (Signed)
Pre visit review using our clinic review tool, if applicable. No additional management support is needed unless otherwise documented below in the visit note. 

## 2018-01-29 NOTE — Assessment & Plan Note (Signed)
Anxiety, depression: Since the last visit she weaned off Cymbalta and went back on Lexapro 10 mg daily, she is also taking Xanax. Feels about the same. We again had a long discussion about the role of a psychiatrist (med mngmt) and a psychologist/counselor.  Those are complementary treatments and she may benefit from both. She did not get much benefit from the counselor she already saw, I recommended to see a new one, more info provided. Also encouraged to increase her physical activity as part of the "treatment" Plan: Increase Lexapro from 10 mg to 20 mg, try to get established with a psychiatrist.  If unable to, follow-up in 4 months. Again encouraged to see a counselor Continue Xanax RTC 4 months to 8 months

## 2018-01-29 NOTE — Patient Instructions (Addendum)
  Please schedule Medicare Wellness with Glenard Haring.   GO TO THE FRONT DESK Schedule your next appointment for a  Follow up in 4 to 8 months (regular check up)

## 2018-01-29 NOTE — Progress Notes (Signed)
Subjective:    Patient ID: Monique Ballard, female    DOB: 1940/11/25, 78 y.o.   MRN: [id number]  DOS:  01/29/2018 Type of visit - description: f/u depression: Good compliance with medications.  Symptoms are about the same.   Review of Systems No s/i Unrelated to the reason for the OV, she is having some nausea vomiting and diarrhea for 2 days.  No fever, chills, blood in the stools or abdominal pain  Past Medical History:  Diagnosis Date  . Breast cancer ([place]) fall 1997    Left Lumpectomy, XRT.  treated with Tamoxifem and Evista  . Depression   . Endometriosis in her 20's   surgery to help clear endometriosis to obtain a pregnancy.  Marland Kitchen GERD (gastroesophageal reflux disease)    w/"stretching" remotely  . HTN (hypertension)     Past Surgical History:  Procedure Laterality Date  . BREAST LUMPECTOMY Left fall 1997   Lymph nodes X 3 were negative, ER+, Radiation treatmetn.  Took Tamoxifem X 5 yrs then Evista for 3-5 yrs.  . CESAREAN SECTION     x2  first pregnancy breach and second normal  . PELVIC FRACTURE SURGERY     endometriosis, remotely    Social History   Socioeconomic History  . Marital status: Married    Spouse name: Not on file  . Number of children: 2  . Years of education: Not on file  . Highest education level: Not on file  Occupational History  . Occupation: retired  Scientific laboratory technician  . Financial resource strain: Not on file  . Food insecurity:    Worry: Not on file    Inability: Not on file  . Transportation needs:    Medical: Not on file    Non-medical: Not on file  Tobacco Use  . Smoking status: Former Research scientist (life sciences)  . Smokeless tobacco: Never Used  . Tobacco comment: Quit >30 yrs ago  Substance and Sexual Activity  . Alcohol use: Yes    Comment: 1 glass wine dialy  . Drug use: No  . Sexual activity: Never    Birth control/protection: None  Lifestyle  . Physical activity:    Days per week: Not on file    Minutes per session: Not on file  . Stress:  Not on file  Relationships  . Social connections:    Talks on phone: Not on file    Gets together: Not on file    Attends religious service: Not on file    Active member of club or organization: Not on file    Attends meetings of clubs or organizations: Not on file    Relationship status: Not on file  . Intimate partner violence:    Fear of current or ex partner: Not on file    Emotionally abused: Not on file    Physically abused: Not on file    Forced sexual activity: Not on file  Other Topics Concern  . 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      Medication List       Accurate as of January 29, 2018 11:05 AM. Always use your most recent med list.        ALPRAZolam 0.25 MG tablet Commonly known as:  XANAX TAKE ONE OR TWO TABLETS BY MOUTH TWICE DAILY AS NEEDED FOR ANXIETY   CENTRUM SILVER PO Take 1 each by mouth daily.   escitalopram 10 MG tablet Commonly known as:  LEXAPRO Take 1 tablet (10 mg total) by mouth daily.   metoprolol tartrate 50 MG tablet Commonly known as:  LOPRESSOR Take 1 tablet (50 mg total) by mouth 2 (two) times daily.   OSCAL 500/200 D-3 500-200 MG-UNIT tablet Generic drug:  calcium-vitamin D Take 1 tablet by mouth  daily.   pantoprazole 40 MG tablet Commonly known as:  PROTONIX Take 1 tablet (40 mg total) by mouth daily.           Objective:   Physical Exam BP 124/68 (BP Location: Left Arm, Patient Position: Sitting, Cuff Size: Small)   Pulse 77   Temp 97.8 F (36.6 C) (Oral)   Resp 16   Ht 5\' 2"  (1.575 m)   Wt 161 lb 4 oz (73.1 kg)   LMP 01/22/1992 (Within Months)   SpO2 95%   BMI 29.49 kg/m  General:   Well developed, NAD, BMI noted. HEENT:  Normocephalic . Face symmetric, atraumatic  Skin: Not pale. Not jaundice Neurologic:  alert & oriented X3.  Speech normal, gait appropriate for age and unassisted Psych--  Cognition and judgment appear intact.  Cooperative with normal attention span and concentration.  Behavior appropriate. No anxious or depressed appearing.      Assessment     Assessment HTN Depression, Anxiety Tremor dx 11-2014 GERD with esophageal stricture L Breast cancer, 1997, lumpectomy, XRT, released from oncology  PLAN:  Anxiety, depression: Since the last visit she weaned off Cymbalta and went back on Lexapro 10 mg daily, she is also taking Xanax. Feels about the same. We again had a long discussion about the role of a psychiatrist (med mngmt) and a psychologist/counselor.  Those are complementary treatments and she may benefit from both. She did not get much benefit from the counselor she already saw, I recommended to see a new one, more info provided. Also encouraged to increase her physical activity as part of the "treatment" Plan: Increase Lexapro from 10 mg to 20 mg, try to get established with a psychiatrist.  If unable to, follow-up in 4 months. Again encouraged to see a counselor Continue Xanax RTC 4 months to 8 months  Today, I spent more than  25  min with the patient: >50% of the time counseling regards difference of psychiatrist vs counselor, listening therapy, multiple questions answer

## 2018-01-30 ENCOUNTER — Other Ambulatory Visit: Payer: Self-pay | Admitting: Internal Medicine

## 2018-02-03 ENCOUNTER — Encounter: Payer: Self-pay | Admitting: Internal Medicine

## 2018-02-19 ENCOUNTER — Telehealth: Payer: Self-pay | Admitting: Internal Medicine

## 2018-02-20 NOTE — Telephone Encounter (Signed)
Sent!

## 2018-02-20 NOTE — Telephone Encounter (Signed)
Pt is requesting refill on alprazolam.   Last OV: 01/29/2018 Last Fill: 01/13/2018 #60 and 0RF UDS: 04/21/2017 Low risk  Pt to see Dr. Clovis Pu at [facility] on 03/12/2018.

## 2018-03-11 ENCOUNTER — Telehealth: Payer: Self-pay

## 2018-03-11 NOTE — Telephone Encounter (Signed)
Copied from [facility] [place] [phone] [id number]. Topic: General - Inquiry >> Mar 11, 2018 10:26 AM Virl Axe D wrote: Reason for CRM: Pt's daughter Monique Ballard called and has several concerns about her mom's health including her balance, short term memory, sleeping too much or not enough, anxiety/depression, and her alcohol intake. She's trying to figure out what to concentrate on so that she can get her mom the help she needs. She would like to be able to speak with Dr. Larose Kells or an assistant. Please advise.

## 2018-03-11 NOTE — Telephone Encounter (Signed)
Pt's daughter not on DPR.

## 2018-03-12 NOTE — Telephone Encounter (Signed)
Spoke w/ Pt- made her aware of call from [facility], informed her since she doesn't have Monique Ballard on her DPR we can't give her any information. Pt is aware that daughter has been worried about her- Pt has appt w/ psychiatry next week and prefers to wait to talk with them before scheduling an appt with PCP. Informed her I would call Monique Ballard to let her know that we have been made aware of these issues and that PCP nor I would be able to give any further information. Pt stated that was fine.   LMOM informing Monique Ballard that PCP has been made aware of these concerns and that we have discussed them w/ Pt.

## 2018-03-12 NOTE — Telephone Encounter (Signed)
Please call the daughter, due to HIPAA regulations I cannot discuss with her Raneem's care however I am now aware of her concerns and appreciate her phone call. Call the patient and schedule an appointment

## 2018-03-12 NOTE — Telephone Encounter (Signed)
thx

## 2018-03-18 ENCOUNTER — Encounter: Payer: Self-pay | Admitting: Psychiatry

## 2018-03-18 ENCOUNTER — Ambulatory Visit (INDEPENDENT_AMBULATORY_CARE_PROVIDER_SITE_OTHER): Payer: Medicare Other | Admitting: Psychiatry

## 2018-03-18 VITALS — BP 137/77 | HR 73 | Ht 62.0 in | Wt 160.0 lb

## 2018-03-18 DIAGNOSIS — F411 Generalized anxiety disorder: Secondary | ICD-10-CM | POA: Diagnosis not present

## 2018-03-18 DIAGNOSIS — F4001 Agoraphobia with panic disorder: Secondary | ICD-10-CM

## 2018-03-18 DIAGNOSIS — F1010 Alcohol abuse, uncomplicated: Secondary | ICD-10-CM | POA: Diagnosis not present

## 2018-03-18 DIAGNOSIS — F331 Major depressive disorder, recurrent, moderate: Secondary | ICD-10-CM | POA: Diagnosis not present

## 2018-03-18 MED ORDER — ARIPIPRAZOLE 5 MG PO TABS: 5.0000 mg | ORAL_TABLET | Freq: Every day | ORAL | 1 refills | Status: DC

## 2018-03-18 NOTE — Progress Notes (Signed)
[facility] MD/PA/NP Initial Note  03/18/2018 4:33 PM Monique Ballard  MRN:  [id number]  Chief Complaint:  Chief Complaint    Other; Anxiety; Depression      HPI: Referred by Dr. Kathlene November. History of depression and anxiety not managed.    Pt reports that mood is Anxious, Depressed, Hopeless and Worthless and describes anxiety as Moderate. Anxiety symptoms include: Excessive Worry, Panic Symptoms, feels SOB.  The cause is problems with son 76 yo. Some chronic background anxeity too. Pt reports has difficulty falling asleep, has interrupted sleep, has restless sleep, is not rested upon awakening, has snoring and has daytime sleepiness. Naps couple hours at least.  Spends a lot of time in bed, that's all I want to do.  Pt reports that appetite is terrible, hard to eat, and poor. Gained weight lately with the wine.   Pt reports that energy is poor and loss of interest or pleasure in usual activities, poor motivation and withdrawn from usual activities. Good friends drag her out of the house.  Concentration is down slightly. Suicidal thoughts:  denied by patient.  Some death thoughts.  This episode since March 2019.  Triggered by son who had a good job, Mudlogger of basketball operations, embezzled money caught and fired.  Got off with community service but the damage has been done.  Can't get over her disappointment and he's living at home now.  Working at a golf course, but can't support himself and not trying to get better.  No good answer, the only answer is for Monique Ballard to get a better job and get out.  On Lexapro 20 NR for 6 weeks.  Drinks wine to help her go to sleep and family concerned.  Had gotten up to 5 glasses and cut back to 2 x 6-8 oz before bed.  Drinks box wine.  Drinking wine all adult life.  No past history of problems with it.  No blackouts.  Often skips knitting and Bible study groups DT depression and anxiety being around people.   Visit Diagnosis:    ICD-10-CM   1. Major  depressive disorder, recurrent episode, moderate (HCC) F33.1   2. Excessive drinking alcohol F10.10   3. Panic disorder with agoraphobia F40.01   4. Generalized anxiety disorder F41.1     Past Psychiatric History: Never seen a psychiatrist.  Psychiatric medications per PCP starting about 2012 include Lexapro 20, Xanax 0.5, clonazepam, Wellbutrin 300 helped her stop smoking but later triggered panic, citalopram 40, duloxetine 60.  Has tolerated them.  Prior history of depression triggered by son's breakup with secondary depression.  She tried meds about 2012.  Meds helped her then.  Counseling not helpful.  Saw Terri Bauert and Oliver Pila- hard to afford.  Past Medical History:  Past Medical History:  Diagnosis Date  . Breast cancer ([place]) fall 1997    Left Lumpectomy, XRT.  treated with Tamoxifem and Evista  . Depression   . Endometriosis in her 20's   surgery to help clear endometriosis to obtain a pregnancy.  Marland Kitchen GERD (gastroesophageal reflux disease)    w/"stretching" remotely  . HTN (hypertension)     Past Surgical History:  Procedure Laterality Date  . BREAST LUMPECTOMY Left fall 1997   Lymph nodes X 3 were negative, ER+, Radiation treatmetn.  Took Tamoxifem X 5 yrs then Evista for 3-5 yrs.  . CESAREAN SECTION     x2  first pregnancy breach and second normal  . PELVIC FRACTURE SURGERY  endometriosis, remotely    Family Psychiatric History: None.   1 son  Monique Ballard lives with them. Dau fine.  Family History:  Family History  Problem Relation Age of Onset  . Hypertension Mother   . Stroke Mother   . Heart attack Father        elderly  . Heart disease Father        CHF  . Throat cancer Other   . Breast cancer Neg Hx   . Colon cancer Neg Hx   . Diabetes Neg Hx     Social History:  Social History   Socioeconomic History  . Marital status: Married    Spouse name: Not on file  . Number of children: 2  . Years of education: Not on file  . Highest education  level: Not on file  Occupational History  . Occupation: retired  Scientific laboratory technician  . Financial resource strain: Not on file  . Food insecurity:    Worry: Not on file    Inability: Not on file  . Transportation needs:    Medical: Not on file    Non-medical: Not on file  Tobacco Use  . Smoking status: Former Research scientist (life sciences)  . Smokeless tobacco: Never Used  . Tobacco comment: Quit >30 yrs ago  Substance and Sexual Activity  . Alcohol use: Yes    Comment: 1 glass wine dialy  . Drug use: No  . Sexual activity: Never    Birth control/protection: None  Lifestyle  . Physical activity:    Days per week: Not on file    Minutes per session: Not on file  . Stress: Not on file  Relationships  . Social connections:    Talks on phone: Not on file    Gets together: Not on file    Attends religious service: Not on file    Active member of club or organization: Not on file    Attends meetings of clubs or organizations: Not on file    Relationship status: Not on file  Other Topics Concern  . 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 Penicillins Rash    Has patient had a PCN reaction causing immediate rash, facial/tongue/throat swelling, SOB or lightheadedness with hypotension: Yes Has patient had a PCN reaction causing severe rash involving mucus membranes or skin necrosis: No Has patient had a PCN reaction that required hospitalization No Has patient had a PCN reaction occurring within the last 10 years: No If all of the above answers are "NO", then may proceed with Cephalosporin use.     Metabolic Disorder Labs: Lab Results  Component Value Date   HGBA1C 5.4 02/06/2016   MPG 108 02/06/2016   No results found for:  PROLACTIN Lab Results  Component Value Date   CHOL 161 01/01/2018   TRIG 197.0 (H) 01/01/2018   HDL 47.70 01/01/2018   CHOLHDL 3 01/01/2018   VLDL 39.4 01/01/2018   LDLCALC 74 01/01/2018   LDLCALC 74 12/22/2015   Lab Results  Component Value Date   TSH 2.27 03/10/2017   TSH 1.38 02/16/2016    Therapeutic Level Labs: No results found for: LITHIUM No results found  for: VALPROATE No components found for:  CBMZ  Current Medications: Current Outpatient Medications  Medication Sig Dispense Refill  . ALPRAZolam (XANAX) 0.25 MG tablet TAKE 1 OR 2 TABLETS BY MOUTH TWICE A DAY AS NEEDED FOR ANXIETY  60 tablet 0  . calcium-vitamin D (OSCAL 500/200 D-3) 500-200 MG-UNIT per tablet Take 1 tablet by mouth daily.      Marland Kitchen escitalopram (LEXAPRO) 20 MG tablet Take 1 tablet (20 mg total) by mouth daily. 90 tablet 1  . metoprolol tartrate (LOPRESSOR) 50 MG tablet Take 1 tablet (50 mg total) by mouth 2 (two) times daily. 180 tablet 1  . Multiple Vitamins-Minerals (CENTRUM SILVER PO) Take 1 each by mouth daily.      . pantoprazole (PROTONIX) 40 MG tablet Take 1 tablet (40 mg total) by mouth daily. 90 tablet 3  . ARIPiprazole (ABILIFY) 5 MG tablet Take 1 tablet (5 mg total) by mouth daily. 30 tablet 1   No current facility-administered medications for this visit.     Medication Side Effects: none  Orders placed this visit:  No orders of the defined types were placed in this encounter.   Psychiatric Specialty Exam:  Review of Systems  Constitutional: Positive for malaise/fatigue and weight loss. Negative for chills and fever.  HENT: Positive for sore throat. Negative for congestion, ear discharge, ear pain, hearing loss, nosebleeds, sinus pain and tinnitus.   Eyes: Negative.  Negative for blurred vision, double vision, photophobia, pain, discharge and redness.  Respiratory: Positive for cough and shortness of breath. Negative for hemoptysis, sputum production and wheezing.   Cardiovascular:  Negative for chest pain, palpitations, orthopnea, claudication, leg swelling and PND.  Gastrointestinal: Positive for abdominal pain, diarrhea, heartburn and nausea. Negative for blood in stool, constipation, melena and vomiting.  Genitourinary: Positive for urgency. Negative for dysuria, flank pain, frequency and hematuria.  Musculoskeletal: Positive for joint pain and myalgias. Negative for back pain, falls and neck pain.  Skin: Positive for itching and rash.  Neurological: Positive for dizziness, tremors and weakness. Negative for tingling, sensory change, speech change, focal weakness, seizures, loss of consciousness and headaches.  Endo/Heme/Allergies: Positive for polydipsia. Does not bruise/bleed easily.  Psychiatric/Behavioral: Positive for depression and substance abuse. Negative for hallucinations, memory loss and suicidal ideas. The patient is nervous/anxious. The patient does not have insomnia.     Blood pressure 137/77, pulse 73, height 5\' 2"  (1.575 m), weight 160 lb (72.6 kg), last menstrual period 01/22/1992.Body mass index is 29.26 kg/m.  General Appearance: Neat  Eye Contact:  Good  Speech:  Normal Rate  Volume:  Normal  Mood:  Anxious, Depressed, Hopeless and Worthless  Affect:  Constricted and Depressed  Thought Process:  Goal Directed  Orientation:  Full (Time, Place, and Person)  Thought Content: Logical   Suicidal Thoughts:  No  Homicidal Thoughts:  No  Memory:  WNL  Judgement:  Good  Insight:  Fair  Psychomotor Activity:  Decreased  Concentration:  Concentration: Good  Recall:  Good  Fund of Knowledge: Good  Language: Good  Assets:  Communication Skills Desire for Improvement Financial Resources/Insurance Housing Intimacy Leisure Time Physical Health Talents/Skills Transportation  ADL's:  Intact  Cognition: WNL  Prognosis:  Good   Screenings:  PHQ2-9     Office Visit from 01/01/2018 in [facility] at [place]  Visit from 04/28/2015 in [facility] at [place] Visit from 04/11/2015 in [facility] at [place]  Visit from 02/24/2015 in [facility] at [place] Visit from 10/11/2014 in [facility] at [facility]  PHQ-2 Total Score  6  6  2  6   0  PHQ-9 Total Score  15  17  5  15   -     MDQ completely negative for bipolar disorder Receiving Psychotherapy: past not present.  Treatment Plan/Recommendations:  Supportive therapy and codependency addressed in detail and recommend dealing with this with H and in counseling .  I strongly recommend counseling because she clearly identifies this depressive episode associated with her son's behavior.  This situation complicates her treatment because she has a limited amount of control over the situation.  Also her husband is somewhat reluctant to address the situation with her.  There is a long pattern of codependent reaction in her mood.  This really can only be addressed in counseling.  We discussed boundaries and limit setting in counseling today.  She will consider this option.  We will discuss it again at the follow-up.  Reduce the alcohol to minimize depression risk.  Extensive discussion of the 2 major alternatives.  She has tried several SSRIs and SNRIs with inadequate response.  I do not believe she could tolerate Wellbutrin with the degree of anxiety that she currently has.  Therefore we discussed the option of switching to Trintellix which is a novel more potent antidepressant or adding Abilify.  Adding Abilify is actually more likely to work faster but also to have a better antianxiety effect than does Trintellix typically.  She therefore chose to add the Abilify.  We discussed the risk of movement disorder and other side effects.  She is to let us know if she has any of those problems.  Add Abilify 5 mg daily to the Lexapro 20 mg  daily.  This appointment lasted an hour  Follow-up 6 weeks  Purnell Shoemaker, MD

## 2018-03-23 ENCOUNTER — Encounter: Payer: Self-pay | Admitting: Internal Medicine

## 2018-03-26 ENCOUNTER — Encounter: Payer: Self-pay | Admitting: Internal Medicine

## 2018-03-26 ENCOUNTER — Telehealth: Payer: Self-pay | Admitting: Psychiatry

## 2018-03-26 ENCOUNTER — Other Ambulatory Visit: Payer: Self-pay

## 2018-03-26 MED ORDER — OLANZAPINE 5 MG PO TABS: ORAL_TABLET | ORAL | 0 refills | Status: DC

## 2018-03-26 NOTE — Telephone Encounter (Signed)
Pt verbalized understanding of instructions. rx submitted to [facility] per her request. Instructed to call back with any questions or concerns

## 2018-03-26 NOTE — Telephone Encounter (Signed)
Patient callled and said that she has been taking the abilify for a week now. She feels worse. Her appetite is so bad that she can't keep food down. Please call her and tell her what to do. Her number is [phone]

## 2018-03-26 NOTE — Telephone Encounter (Signed)
Since she feels worse on the Abilify and is having difficulty eating with a lot of anxiety DC the Abilify  Add olanzapine 5 mg 2 to 3 hours before bedtime.  This will help her anxiety, appetite, and sleep.  She also needs to stop the alcohol because it because it could be contributing to her stomach problems.

## 2018-03-30 ENCOUNTER — Telehealth: Payer: Self-pay | Admitting: Internal Medicine

## 2018-03-30 NOTE — Telephone Encounter (Signed)
Prescription sent

## 2018-03-30 NOTE — Telephone Encounter (Signed)
Pt is requesting refill on alprazolam.   Last OV: 01/29/2018 Last Fill: 02/20/2018 #60 and 0RF UDS: 04/21/2017 Low risk

## 2018-03-31 ENCOUNTER — Encounter: Payer: Self-pay | Admitting: Internal Medicine

## 2018-03-31 ENCOUNTER — Ambulatory Visit (INDEPENDENT_AMBULATORY_CARE_PROVIDER_SITE_OTHER): Payer: Medicare Other | Admitting: Internal Medicine

## 2018-03-31 VITALS — BP 128/68 | HR 74 | Temp 98.0°F | Resp 16 | Ht 62.0 in | Wt 150.5 lb

## 2018-03-31 DIAGNOSIS — F329 Major depressive disorder, single episode, unspecified: Secondary | ICD-10-CM

## 2018-03-31 DIAGNOSIS — F32A Depression, unspecified: Secondary | ICD-10-CM

## 2018-03-31 DIAGNOSIS — F419 Anxiety disorder, unspecified: Secondary | ICD-10-CM | POA: Diagnosis not present

## 2018-03-31 DIAGNOSIS — R1013 Epigastric pain: Secondary | ICD-10-CM | POA: Diagnosis not present

## 2018-03-31 NOTE — Progress Notes (Signed)
Pre visit review using our clinic review tool, if applicable. No additional management support is needed unless otherwise documented below in the visit note. 

## 2018-03-31 NOTE — Progress Notes (Signed)
Subjective:    Patient ID: Monique Ballard, female    DOB: 1940/06/03, 78 y.o.   MRN: [id number]  DOS:  03/31/2018 Type of visit - description: Follow-up Since the last office visit, was prescribed Abilify, had side effects, eventually was switched to olanzapine. Side effects are subsiding, she is actually feeling better emotionally as well.  Review of Systems Had nausea, regurgitation and diarrhea: All that is resolved except for occasional diarrhea. Had abdominal pain, mild symptoms No blood in the stools Denies suicidal ideas No fever chills  Past Medical History:  Diagnosis Date  . Breast cancer ([place]) fall 1997    Left Lumpectomy, XRT.  treated with Tamoxifem and Evista  . Depression   . Endometriosis in her 20's   surgery to help clear endometriosis to obtain a pregnancy.  Marland Kitchen GERD (gastroesophageal reflux disease)    w/"stretching" remotely  . HTN (hypertension)     Past Surgical History:  Procedure Laterality Date  . BREAST LUMPECTOMY Left fall 1997   Lymph nodes X 3 were negative, ER+, Radiation treatmetn.  Took Tamoxifem X 5 yrs then Evista for 3-5 yrs.  . CESAREAN SECTION     x2  first pregnancy breach and second normal  . PELVIC FRACTURE SURGERY     endometriosis, remotely    Social History   Socioeconomic History  . Marital status: Married    Spouse name: Not on file  . Number of children: 2  . Years of education: Not on file  . Highest education level: Not on file  Occupational History  . Occupation: retired  Scientific laboratory technician  . Financial resource strain: Not on file  . Food insecurity:    Worry: Not on file    Inability: Not on file  . Transportation needs:    Medical: Not on file    Non-medical: Not on file  Tobacco Use  . Smoking status: Former Research scientist (life sciences)  . Smokeless tobacco: Never Used  . Tobacco comment: Quit >30 yrs ago  Substance and Sexual Activity  . Alcohol use: Yes    Comment: 1 glass wine dialy  . Drug use: No  . Sexual activity: Never     Birth control/protection: None  Lifestyle  . Physical activity:    Days per week: Not on file    Minutes per session: Not on file  . Stress: Not on file  Relationships  . Social connections:    Talks on phone: Not on file    Gets together: Not on file    Attends religious service: Not on file    Active member of club or organization: Not on file    Attends meetings of clubs or organizations: Not on file    Relationship status: Not on file  . Intimate partner violence:    Fear of current or ex partner: Not on file    Emotionally abused: Not on file    Physically abused: Not on file    Forced sexual activity: Not on file  Other Topics Concern  . 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      Medication List       Accurate as of March 31, 2018  1:07 PM. Always use your most recent med list.        ALPRAZolam 0.25 MG tablet Commonly known as:  XANAX take 1 or 2 tablets by mouth twice daily as needed for anxiety   CENTRUM SILVER PO Take 1 each by mouth daily.   escitalopram 20 MG tablet Commonly known as:  LEXAPRO Take 1 tablet (20 mg total) by mouth daily.   metoprolol tartrate 50 MG tablet Commonly known as:  LOPRESSOR Take 1 tablet (50  mg total) by mouth 2 (two) times daily.   OLANZapine 5 MG tablet Commonly known as:  ZyPREXA TAKE 1 TABLET 2-3 HOURS PRIOR TO BEDTIME   Oscal 500/200 D-3 500-200 MG-UNIT tablet Generic drug:  calcium-vitamin D Take 1 tablet by mouth daily.   pantoprazole 40 MG tablet Commonly known as:  PROTONIX Take 1 tablet (40 mg total) by mouth daily.           Objective:   Physical Exam BP 128/68 (BP Location: Left Arm, Patient Position: Sitting, Cuff Size: Small)   Pulse 74   Temp 98 F (36.7 C) (Oral)   Resp 16   Ht 5\' 2"  (1.575 m)   Wt 150 lb 8 oz (68.3 kg)   LMP 01/22/1992 (Within Months)   SpO2 98%   BMI 27.53 kg/m  General:   Well developed, NAD, BMI noted.  HEENT:  Normocephalic . Face symmetric, atraumatic Lungs:  CTA B Normal respiratory effort, no intercostal retractions, no accessory muscle use. Heart: RRR,  no murmur.  no pretibial edema bilaterally  Abdomen:  Not distended, soft, non-tender. No rebound or rigidity.   Skin: Not pale. Not jaundice Neurologic:  alert & oriented X3.  Speech normal, gait appropriate for age and unassisted Psych--  Cognition and judgment appear intact.  Cooperative with normal attention span and concentration.  Behavior appropriate. No anxious or depressed appearing.     Assessment     Assessment HTN Depression, Anxiety Tremor dx 11-2014 GERD with esophageal stricture L Breast cancer, 1997, lumpectomy, XRT, released from oncology  PLAN: Anxiety depression: Since the last visit, saw psychiatry, Abilify prescribed, multiple GI side effects, eventually it was stopped, started olanzapine few days ago and is already feeling better from the GI standpoint.  Feeling better emotionally as well. Will continue follow-up with psychiatry. I again encouraged her to seek counseling.  Benefits of exercise discussed. Also, she has moderate EtOH intake significantly, usually gets 1 glass of wine at night, very seldom 2  glasses. Dyspepsia: Improving, likely was a s/e of Abilify, no need for further evaluation. RTC 6 months

## 2018-03-31 NOTE — Patient Instructions (Signed)
   GO TO THE FRONT DESK Schedule your next appointment   For 6 months

## 2018-04-01 NOTE — Assessment & Plan Note (Signed)
Anxiety depression: Since the last visit, saw psychiatry, Abilify prescribed, multiple GI side effects, eventually it was stopped, started olanzapine few days ago and is already feeling better from the GI standpoint.  Feeling better emotionally as well. Will continue follow-up with psychiatry. I again encouraged her to seek counseling.  Benefits of exercise discussed. Also, she has moderate EtOH intake significantly, usually gets 1 glass of wine at night, very seldom 2 glasses. Dyspepsia: Improving, likely was a s/e of Abilify, no need for further evaluation. RTC 6 months

## 2018-04-06 ENCOUNTER — Other Ambulatory Visit: Payer: Self-pay | Admitting: Internal Medicine

## 2018-04-06 DIAGNOSIS — Z1231 Encounter for screening mammogram for malignant neoplasm of breast: Secondary | ICD-10-CM

## 2018-04-07 ENCOUNTER — Ambulatory Visit
Admission: RE | Admit: 2018-04-07 | Discharge: 2018-04-07 | Disposition: A | Discharge status: Home / Self Care | Payer: Medicare Other | Source: Ambulatory Visit | Attending: Internal Medicine | Admitting: Internal Medicine

## 2018-04-07 ENCOUNTER — Other Ambulatory Visit: Payer: Self-pay

## 2018-04-07 DIAGNOSIS — Z1231 Encounter for screening mammogram for malignant neoplasm of breast: Secondary | ICD-10-CM | POA: Diagnosis not present

## 2018-04-07 IMAGING — MG DIGITAL SCREENING BILATERAL MAMMOGRAM WITH TOMO AND CAD
8 series · 9 of 24 positions shown · non-contrast
Comparison: Previous exam(s).

CLINICAL DATA: Screening.

EXAM:
DIGITAL SCREENING BILATERAL MAMMOGRAM WITH TOMO AND CAD

[R CC synth-2D]
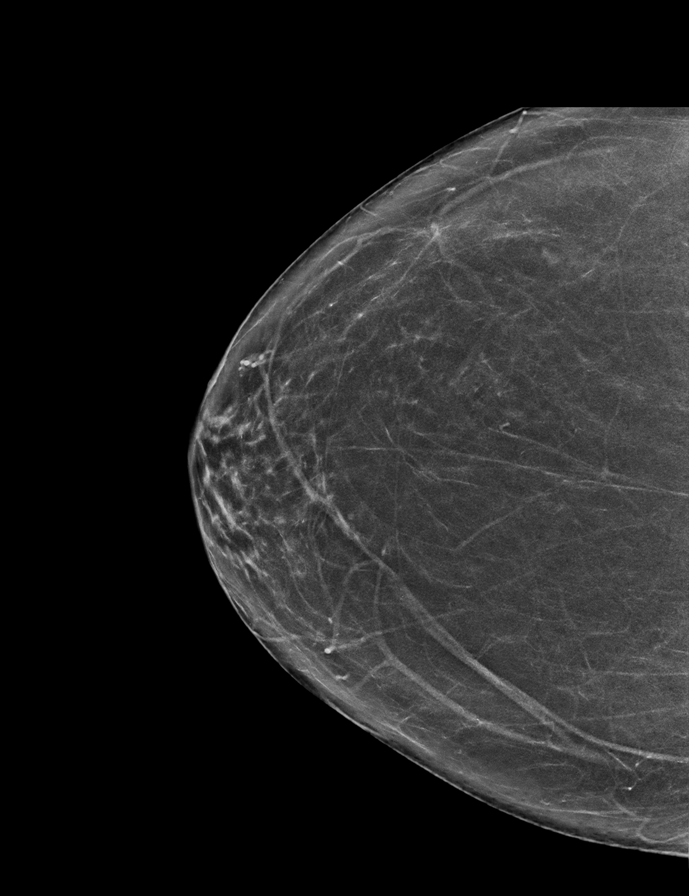

[L MLO synth-2D]
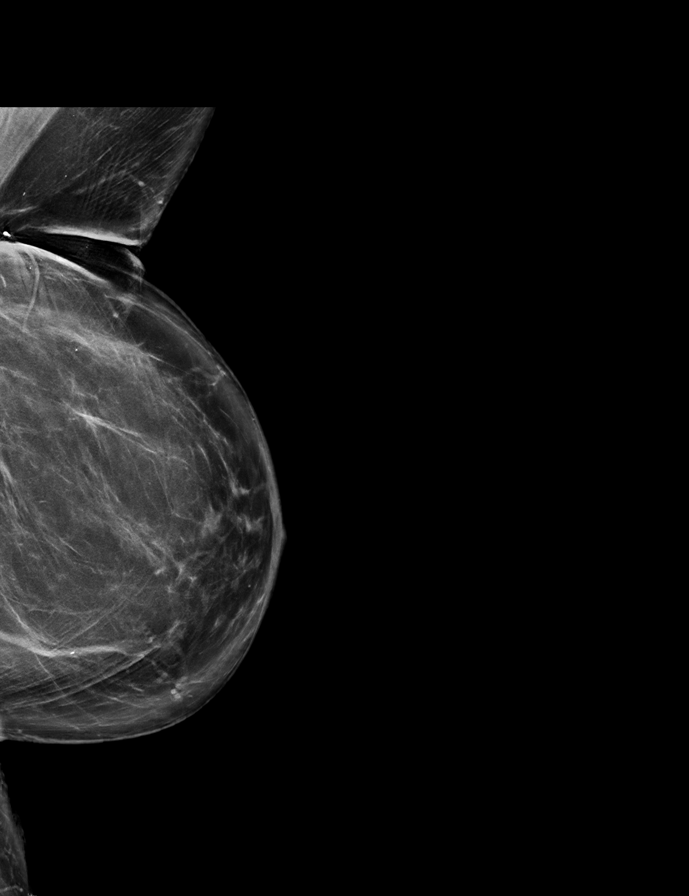

[L CC synth-2D]
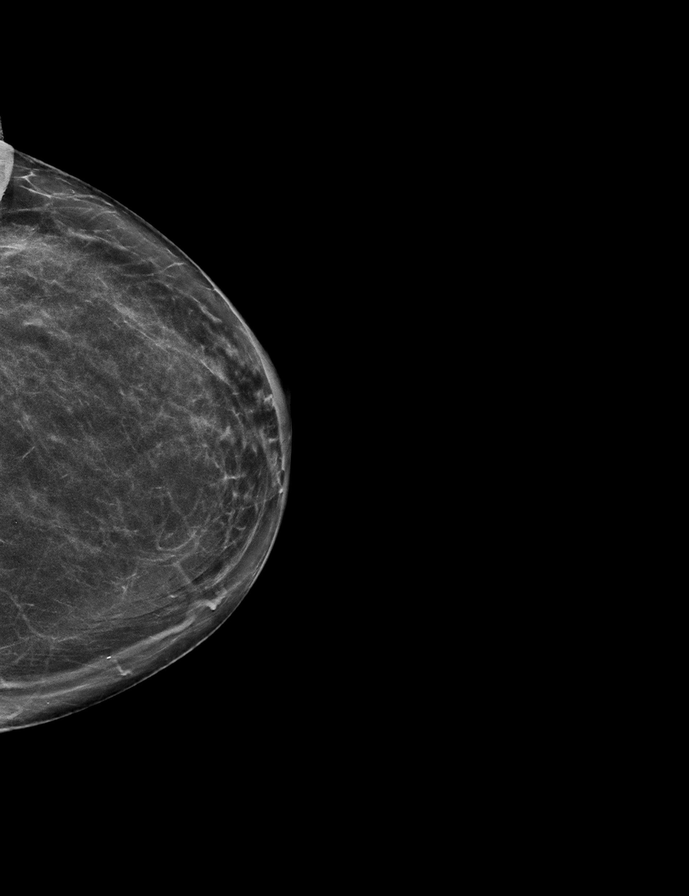

[R MLO synth-2D]
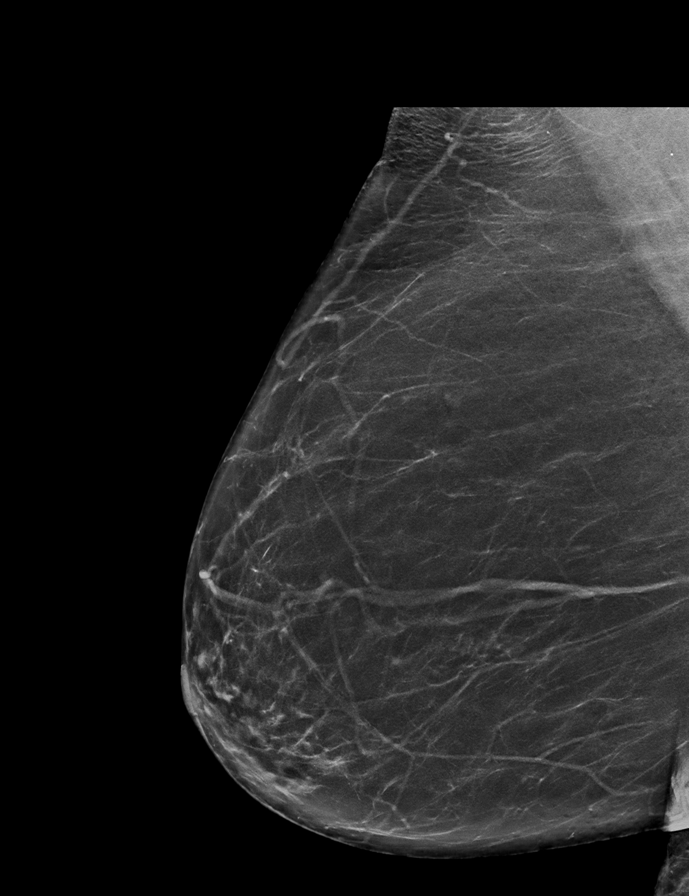

[R CC tomo · 2 of 64 frames shown]
[frame 21/64]
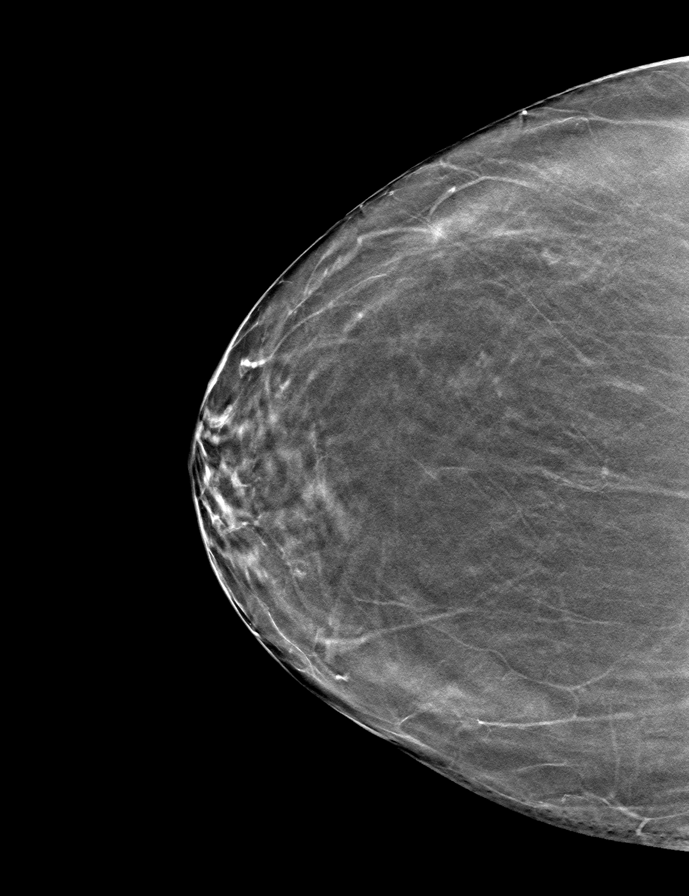
[frame 33/64]
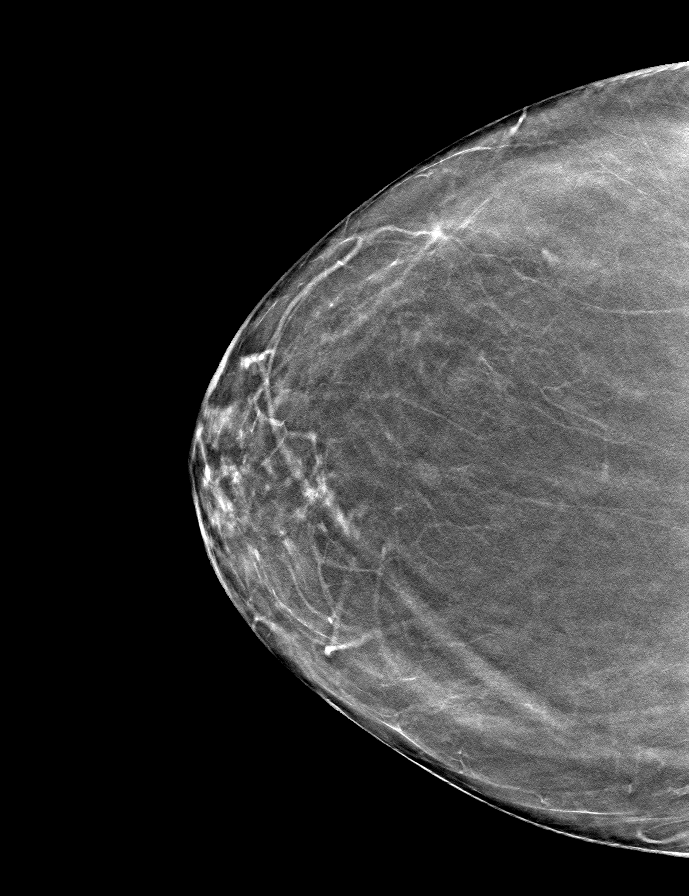

[R MLO tomo · tomo slice 37/72.0]
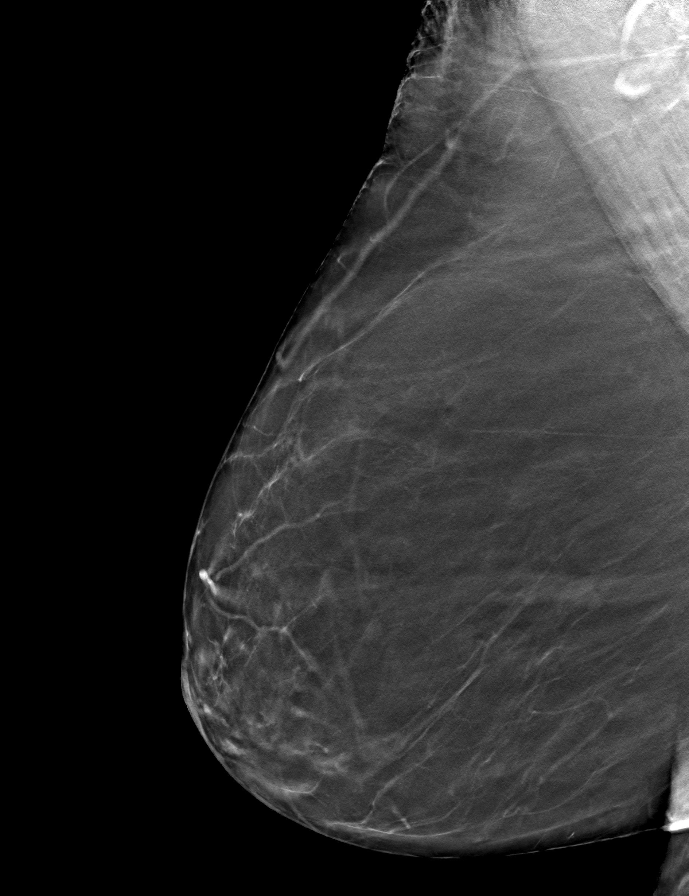

[L MLO tomo · tomo slice 39/77.0]
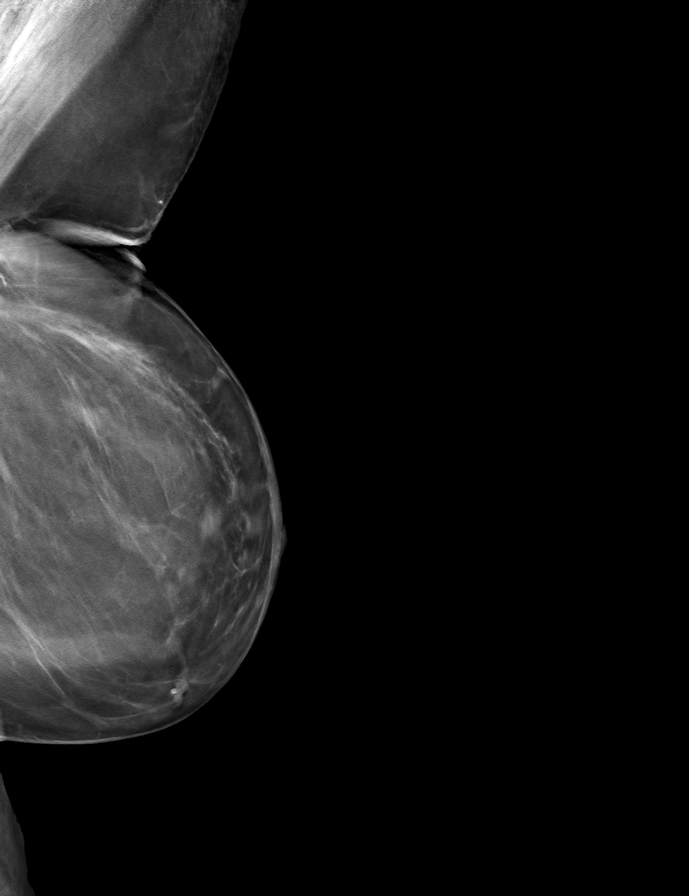

[L CC tomo · tomo slice 31/61.0]
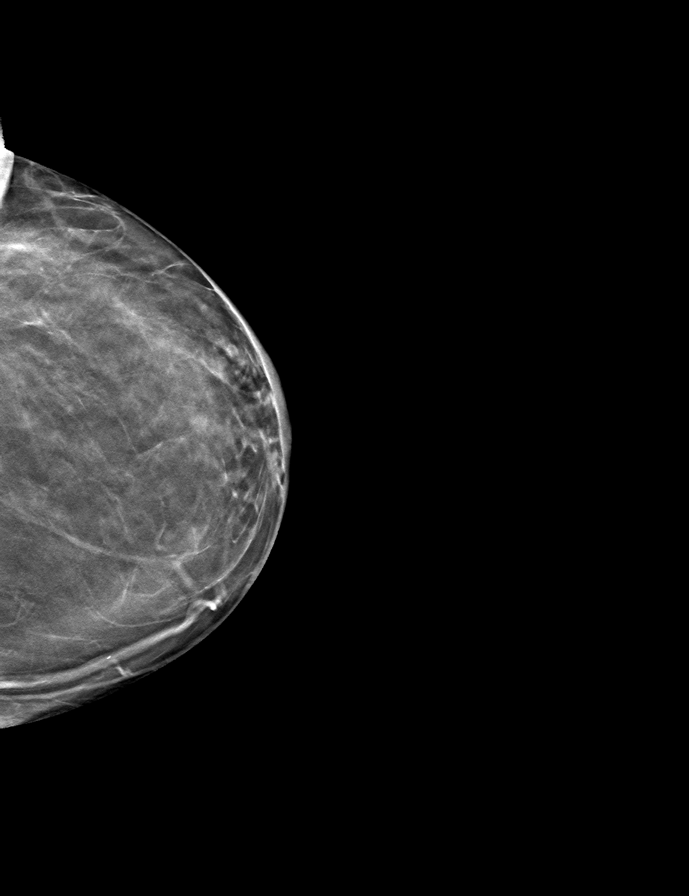

[9 of 24 positions shown; findings below may reference images not displayed]

ACR Breast Density Category b: There are scattered areas of
fibroglandular density.
FINDINGS: There are no findings suspicious for malignancy. Images were
processed with CAD.
IMPRESSION: No mammographic evidence of malignancy. A result letter of this
screening mammogram will be mailed directly to the patient.

RECOMMENDATION:
Screening mammogram in one year. (Code:[TQ])

BI-RADS CATEGORY  1: Negative.

## 2018-04-21 ENCOUNTER — Other Ambulatory Visit: Payer: Self-pay

## 2018-04-21 ENCOUNTER — Other Ambulatory Visit: Payer: Self-pay | Admitting: Psychiatry

## 2018-04-21 ENCOUNTER — Telehealth: Payer: Self-pay | Admitting: Internal Medicine

## 2018-04-21 ENCOUNTER — Ambulatory Visit: Payer: Medicare Other | Admitting: *Deleted

## 2018-04-21 NOTE — Progress Notes (Deleted)
I connected with Meta on 04/21/18 at  1:00 PM EDT by a video enabled telemedicine application and verified that I am speaking with the correct person using two identifiers.   Subjective:   Monique Ballard is a 78 y.o. female who presents for Medicare Annual (Subsequent) preventive examination.  Review of Systems: No ROS.  Medicare Wellness Visit. Additional risk factors are reflected in the social history.   Sleep patterns:  Home Safety/Smoke Alarms: Feels safe in home. Smoke alarms in place.     Female:     Mammo- 04/08/18      Dexa scan-    05/08/16 [facility]- 06/08/13. 10 yr recall    Objective:     Vitals: uta/ virtual visit/ covid 19   Advanced Directives 04/29/2017 10/01/2014  Does Patient Have a Medical Advance Directive? No No  Would patient like information on creating a medical advance directive? Yes (MAU/Ambulatory/Procedural Areas - Information given) No - patient declined information    Tobacco Social History   Tobacco Use  Smoking Status Former Smoker  Smokeless Tobacco Never Used  Tobacco Comment   Quit >30 yrs ago     Counseling given: Not Answered Comment: Quit >30 yrs ago   Clinical Intake:                       Past Medical History:  Diagnosis Date   Breast cancer ([place]) fall 1997    Left Lumpectomy, XRT.  treated with Tamoxifem and Evista   Depression    Endometriosis in her 20's   surgery to help clear endometriosis to obtain a pregnancy.   GERD (gastroesophageal reflux disease)    w/"stretching" remotely   HTN (hypertension)    Past Surgical History:  Procedure Laterality Date   BREAST LUMPECTOMY Left fall 1997   Lymph nodes X 3 were negative, ER+, Radiation treatmetn.  Took Tamoxifem X 5 yrs then Evista for 3-5 yrs.   CESAREAN SECTION     x2  first pregnancy breach and second normal   PELVIC FRACTURE SURGERY     endometriosis, remotely   Family History  Problem Relation Age of Onset   Hypertension Mother     Stroke Mother    Heart attack Father        elderly   Heart disease Father        CHF   Throat cancer Other    Breast cancer Neg Hx    Colon cancer Neg Hx    Diabetes Neg Hx    Social History   Socioeconomic History   Marital status: Married    Spouse name: Not on file   Number of children: 2   Years of education: Not on file   Highest education level: Not on file  Occupational History   Occupation: retired  Scientist, product/process development strain: Not on file   Food insecurity:    Worry: Not on file    Inability: Not on Lexicographer needs:    Medical: Not on file    Non-medical: Not on file  Tobacco Use   Smoking status: Former Smoker   Smokeless tobacco: Never Used   Tobacco comment: Quit >30 yrs ago  Substance and Sexual Activity   Alcohol use: Yes    Comment: 1 glass wine dialy   Drug use: No   Sexual activity: Never    Birth control/protection: None  Lifestyle   Physical activity:    Days per week:  Not on file    Minutes per session: Not on file   Stress: Not on file  Relationships   Social connections:    Talks on phone: Not on file    Gets together: Not on file    Attends religious service: Not on file    Active member of club or organization: Not on file    Attends meetings of clubs or organizations: Not on file    Relationship status: Not on file  Other Topics Concern   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     escitalopram (LEXAPRO) 20 MG tablet Take 1 tablet (20 mg total) by mouth daily.   metoprolol tartrate (LOPRESSOR) 50 MG tablet Take 1 tablet (50 mg total) by mouth 2 (two) times daily.   Multiple Vitamins-Minerals (CENTRUM SILVER PO) Take 1 each by mouth daily.     OLANZapine (ZYPREXA) 5 MG tablet TAKE 1 TABLET 2-3 HOURS PRIOR TO BEDTIME   pantoprazole (PROTONIX) 40 MG tablet Take 1 tablet (40 mg total) by mouth daily.   No facility-administered encounter medications on file as of 04/21/2018.     Activities of Daily Living In your present state of health, do you have any difficulty performing the following activities: 01/01/2018  Hearing? N  Vision? N  Difficulty concentrating or making decisions? N  Walking or climbing stairs? N  Dressing or bathing? N  Doing errands, shopping? N  Some recent data might be hidden    Patient Care Team: Colon Branch, MD as PCP - Windle Guard, MD as Consulting Physician (Gastroenterology) Kem Boroughs, FNP as Nurse Practitioner (Family Medicine)    Assessment:   This is a routine wellness examination for [place]. Physical assessment deferred to PCP.  Exercise Activities and Dietary recommendations   Diet (meal preparation, eat out, water intake, caffeinated beverages, dairy products, fruits and vegetables): {Desc; diets:16563} Breakfast: Lunch:  Dinner:      Goals   None     Fall Risk Fall Risk  04/21/2017 03/10/2017 04/28/2015 04/11/2015 02/24/2015  Falls in the past year? Yes No No No No  Number falls in past yr: 2 or more - - - -  Injury with Fall? No - - - -  Risk Factor Category  High Fall Risk - - - -  Follow up Falls evaluation completed;Education provided - - - -   Depression Screen PHQ 2/9 Scores 01/01/2018 04/21/2017 03/10/2017 04/28/2015  PHQ - 2 Score 6 - - 6  PHQ- 9 Score 15 - - 17  Exception Documentation - Other- indicate reason in comment box Other- indicate reason in comment box -      Cognitive Function Ad8 score reviewed for issues:  Issues making decisions:no  Less interest in hobbies / activities:no  Repeats questions, stories (family complaining):no  Trouble using ordinary gadgets (microwave, computer, phone):no  Forgets the month or year: no  Mismanaging finances: no  Remembering appts:no  Daily problems with thinking and/or memory:no Ad8 score is=0         Immunization History  Administered Date(s) Administered   Influenza Split 12/08/2013   Influenza Whole 11/16/2009   Influenza, High Dose Seasonal PF 10/14/2014, 12/22/2015, 11/25/2016, 01/01/2018   Pneumococcal Conjugate-13 08/10/2014   Pneumococcal Polysaccharide-23 08/28/2009   Td 02/07/1999, 08/24/2008   Tdap 10/01/2014   Zoster 08/28/2009    Screening Tests Health Maintenance  Topic Date Due   MAMMOGRAM  04/07/2019   COLONOSCOPY  06/09/2023   TETANUS/TDAP  09/30/2024   INFLUENZA VACCINE  Completed   DEXA SCAN  Completed   PNA vac Low Risk Adult  Completed       Plan:   ***   I have personally reviewed and noted the following in the patients chart:    Medical and social history  Use of alcohol, tobacco or illicit drugs   Current medications and supplements  Functional ability and status  Nutritional status  Physical activity  Advanced directives  List of other physicians  Hospitalizations, surgeries, and ER visits in previous 12 months  Vitals  Screenings to include cognitive, depression, and falls  Referrals and appointments  In addition, I have reviewed and discussed with patient certain preventive protocols, quality metrics, and best practice recommendations. A written personalized care plan for preventive services as well as general preventive health recommendations were provided to patient.     Shela Nevin, [place]  04/21/2018

## 2018-04-21 NOTE — Telephone Encounter (Signed)
Pt is requesting refill on alprazolam.   Last OV: 03/31/2018 Last Fill; 03/30/2018 #60 and 0RF UDS: 04/21/2017 low risk

## 2018-04-22 NOTE — Telephone Encounter (Signed)
Is slightly early but will go ahead and refill it

## 2018-04-22 NOTE — Progress Notes (Addendum)
I connected with Monique Ballard on 04/23/18 at  2:00 PM EDT by a video enabled telemedicine application and verified that I am speaking with the correct person using two identifiers.   Subjective:   Monique Ballard is a 78 y.o. female who presents for Medicare Annual (Subsequent) preventive examination.  Review of Systems: No ROS.  Medicare Wellness Visit. Additional risk factors are reflected in the social history. Cardiac Risk Factors include: advanced age (>66men, >102 women);hypertension Sleep patterns: Pt takes zyprexa helps her sleep. 6-8 hrs.  Home Safety/Smoke Alarms: Feels safe in home. Smoke alarms in place.  Lives in 1 story home with husband and adult son. 1 small dog. Walk in shower w/ 2 grab rails.   Female:     Mammo-04/08/18       Dexa scan- 05/08/16       CCS-next due 05/2023     Objective:     Vitals: LMP 01/22/1992 (Within Months)   There is no height or weight on file to calculate BMI.  Advanced Directives 04/23/2018 04/29/2017 10/01/2014  Does Patient Have a Medical Advance Directive? No No No  Would patient like information on creating a medical advance directive? No - Patient declined Yes (MAU/Ambulatory/Procedural Areas - Information given) No - patient declined information    Tobacco Social History   Tobacco Use  Smoking Status Former Smoker  Smokeless Tobacco Never Used  Tobacco Comment   Quit >30 yrs ago     Counseling given: Not Answered Comment: Quit >30 yrs ago   Clinical Intake:     Pain : No/denies pain    Past Medical History:  Diagnosis Date  . Breast cancer ([place]) fall 1997    Left Lumpectomy, XRT.  treated with Tamoxifem and Evista  . Depression   . Endometriosis in her 20's   surgery to help clear endometriosis to obtain a pregnancy.  Marland Kitchen GERD (gastroesophageal reflux disease)    w/"stretching" remotely  . HTN (hypertension)    Past Surgical History:  Procedure Laterality Date  . BREAST LUMPECTOMY Left fall 1997   Lymph nodes X 3  were negative, ER+, Radiation treatmetn.  Took Tamoxifem X 5 yrs then Evista for 3-5 yrs.  . CESAREAN SECTION     x2  first pregnancy breach and second normal  . PELVIC FRACTURE SURGERY     endometriosis, remotely   Family History  Problem Relation Age of Onset  . Hypertension Mother   . Stroke Mother   . Heart attack Father        elderly  . Heart disease Father        CHF  . Throat cancer Other   . Breast cancer Neg Hx   . Colon cancer Neg Hx   . Diabetes Neg Hx    Social History   Socioeconomic History  . Marital status: Married    Spouse name: Not on file  . Number of children: 2  . Years of education: Not on file  . Highest education level: Not on file  Occupational History  . Occupation: retired  Scientific laboratory technician  . Financial resource strain: Not on file  . Food insecurity:    Worry: Not on file    Inability: Not on file  . Transportation needs:    Medical: Not on file    Non-medical: Not on file  Tobacco Use  . Smoking status: Former Research scientist (life sciences)  . Smokeless tobacco: Never Used  . Tobacco comment: Quit >30 yrs ago  Substance and Sexual  Activity  . Alcohol use: Yes    Comment: 2 glass wine dialy  . Drug use: No  . Sexual activity: Never    Birth control/protection: None  Lifestyle  . Physical activity:    Days per week: Not on file    Minutes per session: Not on file  . Stress: Not on file  Relationships  . Social connections:    Talks on phone: Not on file    Gets together: Not on file    Attends religious service: Not on file    Active member of club or organization: Not on file    Attends meetings of clubs or organizations: Not on file    Relationship status: Not on file  Other Topics Concern  . 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 ALPRAZolam (XANAX) 0.25 MG tablet TAKE ONE TO TWO TABLETS BY MOUTH TWICE DAILY AS NEEDED FOR ANXIETY  . calcium-vitamin D (OSCAL 500/200 D-3) 500-200 MG-UNIT per tablet Take 1 tablet by mouth daily.    Marland Kitchen escitalopram (LEXAPRO) 20 MG tablet Take 1 tablet (20 mg total) by mouth daily.  . metoprolol tartrate (LOPRESSOR) 50 MG tablet Take 1 tablet (50 mg total) by mouth 2 (two) times daily.  . Multiple Vitamins-Minerals (CENTRUM SILVER PO) Take 1 each by mouth daily.    Marland Kitchen OLANZapine (ZYPREXA) 5 MG tablet TAKE 1 TABLET BY MOUTH 2 TO 3 HOURS PRIOR TO BEDTIME  . pantoprazole (PROTONIX) 40 MG tablet Take 1 tablet (40 mg total) by mouth daily.   No facility-administered encounter medications on file as of 04/23/2018.     Activities of Daily Living In your present state of health, do you have any difficulty performing the following activities: 04/23/2018 01/01/2018  Hearing? N N  Vision? N N  Difficulty concentrating or making decisions? N N  Walking or climbing stairs? Y N  Comment uses cane -  Dressing or bathing? N N  Doing errands, shopping? N N  Preparing Food and eating ? N -  Using the Toilet? N -  In the past six months, have you accidently leaked urine? Y -  Comment wears depends -  Do you have problems with loss of bowel control? Y -  Managing your Medications? N -  Managing your Finances? N -  Housekeeping or managing your Housekeeping? N -  Some recent data might be hidden    Patient Care Team: Colon Branch, MD as PCP - Windle Guard, MD as Consulting Physician (Gastroenterology) Kem Boroughs, FNP as Nurse Practitioner (Family Medicine)    Assessment:   This is a routine wellness examination for Monique Ballard. Physical assessment deferred to PCP.  Exercise Activities and Dietary recommendations Current Exercise Habits: Home exercise routine(does PT exercises at home ), Type of exercise:  stretching, Time (Minutes): 15, Frequency (Times/Week): 7, Weekly Exercise (Minutes/Week): 105, Exercise limited by: None identified   Diet (meal preparation, eat out, water intake, caffeinated beverages, dairy products, fruits and vegetables): well balanced, on average, 2-3 meals per day   Goals    . Continue PT balance exercises to prevent more falls. (pt-stated)  Fall Risk Fall Risk  04/23/2018 04/21/2017 03/10/2017 04/28/2015 04/11/2015  Falls in the past year? 1 Yes No No No  Number falls in past yr: 1 2 or more - - -  Injury with Fall? 1 No - - -  Risk Factor Category  - High Fall Risk - - -  Risk for fall due to : Impaired balance/gait - - - -  Follow up - Falls evaluation completed;Education provided - - -    Depression Screen PHQ 2/9 Scores 04/23/2018 01/01/2018 04/21/2017 03/10/2017  PHQ - 2 Score 1 6 - -  PHQ- 9 Score - 15 - -  Exception Documentation - - Other- indicate reason in comment box Other- indicate reason in comment box     Cognitive Function Ad8 score reviewed for issues:  Issues making decisions:no  Less interest in hobbies / activities:no  Repeats questions, stories (family complaining):no  Trouble using ordinary gadgets (microwave, computer, phone):no  Forgets the month or year: no  Mismanaging finances: no  Remembering appts:no  Daily problems with thinking and/or memory:no Ad8 score is=0        Immunization History  Administered Date(s) Administered  . Influenza Split 12/08/2013  . Influenza Whole 11/16/2009  . Influenza, High Dose Seasonal PF 10/14/2014, 12/22/2015, 11/25/2016, 01/01/2018  . Pneumococcal Conjugate-13 08/10/2014  . Pneumococcal Polysaccharide-23 08/28/2009  . Td 02/07/1999, 08/24/2008  . Tdap 10/01/2014  . Zoster 08/28/2009   Screening Tests Health Maintenance  Topic Date Due  . INFLUENZA VACCINE  08/22/2018  . MAMMOGRAM  04/07/2019  . COLONOSCOPY  06/09/2023  . TETANUS/TDAP  09/30/2024  . DEXA SCAN  Completed  .  PNA vac Low Risk Adult  Completed       Plan:   See you next year!  Continue to eat heart healthy diet (full of fruits, vegetables, whole grains, lean protein, water--limit salt, fat, and sugar intake) and increase physical activity as tolerated.  Continue doing brain stimulating activities (puzzles, reading, adult coloring books, staying active) to keep memory sharp.   Bring a copy of your living will and/or healthcare power of attorney to your next office visit.    I have personally reviewed and noted the following in the patient's chart:   . Medical and social history . Use of alcohol, tobacco or illicit drugs  . Current medications and supplements . Functional ability and status . Nutritional status . Physical activity . Advanced directives . List of other physicians . Hospitalizations, surgeries, and ER visits in previous 12 months . Vitals . Screenings to include cognitive, depression, and falls . Referrals and appointments  In addition, I have reviewed and discussed with patient certain preventive protocols, quality metrics, and best practice recommendations. A written personalized care plan for preventive services as well as general preventive health recommendations were provided to patient.     Naaman Plummer [place], [place]  04/23/2018  Kathlene November, MD

## 2018-04-23 ENCOUNTER — Encounter: Payer: Self-pay | Admitting: Internal Medicine

## 2018-04-23 ENCOUNTER — Encounter: Payer: Self-pay | Admitting: *Deleted

## 2018-04-23 ENCOUNTER — Ambulatory Visit (INDEPENDENT_AMBULATORY_CARE_PROVIDER_SITE_OTHER): Payer: Medicare Other | Admitting: *Deleted

## 2018-04-23 ENCOUNTER — Other Ambulatory Visit: Payer: Self-pay

## 2018-04-23 DIAGNOSIS — Z0000 Encounter for general adult medical examination without abnormal findings: Secondary | ICD-10-CM | POA: Diagnosis not present

## 2018-04-23 NOTE — Patient Instructions (Signed)
See you next year!  Continue to eat heart healthy diet (full of fruits, vegetables, whole grains, lean protein, water--limit salt, fat, and sugar intake) and increase physical activity as tolerated.  Continue doing brain stimulating activities (puzzles, reading, adult coloring books, staying active) to keep memory sharp.   Bring a copy of your living will and/or healthcare power of attorney to your next office visit.   Monique Ballard , Thank you for taking time to come for your Medicare Wellness Visit. I appreciate your ongoing commitment to your health goals. Please review the following plan we discussed and let me know if I can assist you in the future.   These are the goals we discussed: Goals    . Continue PT balance exercises to prevent more falls. (pt-stated)       This is a list of the screening recommended for you and due dates:  Health Maintenance  Topic Date Due  . Flu Shot  08/22/2018  . Mammogram  04/07/2019  . Colon Cancer Screening  06/09/2023  . Tetanus Vaccine  09/30/2024  . DEXA scan (bone density measurement)  Completed  . Pneumonia vaccines  Completed    Health Maintenance After Age 35 After age 35, you are at a higher risk for certain long-term diseases and infections as well as injuries from falls. Falls are a major cause of broken bones and head injuries in people who are older than age 29. Getting regular preventive care can help to keep you healthy and well. Preventive care includes getting regular testing and making lifestyle changes as recommended by your health care provider. Talk with your health care provider about:  Which screenings and tests you should have. A screening is a test that checks for a disease when you have no symptoms.  A diet and exercise plan that is right for you. What should I know about screenings and tests to prevent falls? Screening and testing are the best ways to find a health problem early. Early diagnosis and treatment give you  the best chance of managing medical conditions that are common after age 74. Certain conditions and lifestyle choices may make you more likely to have a fall. Your health care provider may recommend:  Regular vision checks. Poor vision and conditions such as cataracts can make you more likely to have a fall. If you wear glasses, make sure to get your prescription updated if your vision changes.  Medicine review. Work with your health care provider to regularly review all of the medicines you are taking, including over-the-counter medicines. Ask your health care provider about any side effects that may make you more likely to have a fall. Tell your health care provider if any medicines that you take make you feel dizzy or sleepy.  Osteoporosis screening. Osteoporosis is a condition that causes the bones to get weaker. This can make the bones weak and cause them to break more easily.  Blood pressure screening. Blood pressure changes and medicines to control blood pressure can make you feel dizzy.  Strength and balance checks. Your health care provider may recommend certain tests to check your strength and balance while standing, walking, or changing positions.  Foot health exam. Foot pain and numbness, as well as not wearing proper footwear, can make you more likely to have a fall.  Depression screening. You may be more likely to have a fall if you have a fear of falling, feel emotionally low, or feel unable to do activities that you used to  do.  Alcohol use screening. Using too much alcohol can affect your balance and may make you more likely to have a fall. What actions can I take to lower my risk of falls? General instructions  Talk with your health care provider about your risks for falling. Tell your health care provider if: ? You fall. Be sure to tell your health care provider about all falls, even ones that seem minor. ? You feel dizzy, sleepy, or off-balance.  Take over-the-counter and  prescription medicines only as told by your health care provider. These include any supplements.  Eat a healthy diet and maintain a healthy weight. A healthy diet includes low-fat dairy products, low-fat (lean) meats, and fiber from whole grains, beans, and lots of fruits and vegetables. Home safety  Remove any tripping hazards, such as rugs, cords, and clutter.  Install safety equipment such as grab bars in bathrooms and safety rails on stairs.  Keep rooms and walkways well-lit. Activity   Follow a regular exercise program to stay fit. This will help you maintain your balance. Ask your health care provider what types of exercise are appropriate for you.  If you need a cane or walker, use it as recommended by your health care provider.  Wear supportive shoes that have nonskid soles. Lifestyle  Do not drink alcohol if your health care provider tells you not to drink.  If you drink alcohol, limit how much you have: ? 0-1 drink a day for women. ? 0-2 drinks a day for men.  Be aware of how much alcohol is in your drink. In the [place] one drink equals one typical bottle of beer (12 oz), one-half glass of wine (5 oz), or one shot of hard liquor (1 oz).  Do not use any products that contain nicotine or tobacco, such as cigarettes and e-cigarettes. If you need help quitting, ask your health care provider. Summary  Having a healthy lifestyle and getting preventive care can help to protect your health and wellness after age 57.  Screening and testing are the best way to find a health problem early and help you avoid having a fall. Early diagnosis and treatment give you the best chance for managing medical conditions that are more common for people who are older than age 60.  Falls are a major cause of broken bones and head injuries in people who are older than age 41. Take precautions to prevent a fall at home.  Work with your health care provider to learn what changes you can make to  improve your health and wellness and to prevent falls. This information is not intended to replace advice given to you by your health care provider. Make sure you discuss any questions you have with your health care provider. Document Released: 11/20/2016 Document Revised: 11/20/2016 Document Reviewed: 11/20/2016 Elsevier Interactive Patient Education  2019 [employer].

## 2018-04-29 ENCOUNTER — Encounter: Payer: Self-pay | Admitting: Internal Medicine

## 2018-04-29 ENCOUNTER — Ambulatory Visit (INDEPENDENT_AMBULATORY_CARE_PROVIDER_SITE_OTHER): Payer: Medicare Other | Admitting: Internal Medicine

## 2018-04-29 ENCOUNTER — Other Ambulatory Visit: Payer: Self-pay

## 2018-04-29 DIAGNOSIS — R509 Fever, unspecified: Secondary | ICD-10-CM

## 2018-04-29 NOTE — Assessment & Plan Note (Signed)
  Febrile illness: Had fever for 2 days, now is having malaise, fatigue.  She does not look toxic. She probably had a virus, possibly a mild URI although COVID-19 is in the DDX. At this point we agreed on supportive treatment with fluids, Tylenol, occasional Advil, Robitussin-DM. ER if severe symptoms.  We are sending the patient a message with our recommendations. We talk about possibly antibiotics but at this point both the patient and me do not believe that is necessary.

## 2018-04-29 NOTE — Progress Notes (Signed)
Subjective:    Patient ID: Monique Ballard, female    DOB: [birth year], 78 y.o.   MRN: [id number]  DOS:  04/29/2018 Type of visit - description: Virtual Visit via Video Note  I connected with@ on 04/29/18 at  4:15 PM EDT by a video enabled telemedicine application and verified that I am speaking with the correct person using two identifiers.   THIS ENCOUNTER IS A VIRTUAL VISIT DUE TO COVID-19 - PATIENT WAS NOT SEEN IN THE OFFICE. PATIENT HAS CONSENTED TO VIRTUAL VISIT / TELEMEDICINE VISIT   Location of patient: home  Location of provider: office  I discussed the limitations of evaluation and management by telemedicine and the availability of in person appointments. The patient expressed understanding and agreed to proceed.  History of Present Illness: Acute visit The patient develop fever of 100.4 on 04/23/2018, it lasted for a couple of days, temperature decreased with Tylenol or Advil. The reason she is calling is because since then she is extremely fatigued, has a poor appetite and is not feeling well: "all I like to do is sleep". No fever in the most recent days, she does take occasional Tylenol or Advil but not frequently so.    Review of Systems Denies runny nose or sore throat although in the mornings she has some clear discharge from the nose. No itchy eyes or sneezing. No chest pain Minimal difficulty breathing. Has a mild cough with clear sputum. No vomiting or diarrhea She is following all the CDC recommendations regards COVID-19 pandemia. No known sick contacts.  Past Medical History:  Diagnosis Date  . Breast cancer ([place]) fall 1997    Left Lumpectomy, XRT.  treated with Tamoxifem and Evista  . Depression   . Endometriosis in her 20's   surgery to help clear endometriosis to obtain a pregnancy.  Marland Kitchen GERD (gastroesophageal reflux disease)    w/"stretching" remotely  . HTN (hypertension)     Past Surgical History:  Procedure Laterality Date  . BREAST LUMPECTOMY  Left fall 1997   Lymph nodes X 3 were negative, ER+, Radiation treatmetn.  Took Tamoxifem X 5 yrs then Evista for 3-5 yrs.  . CESAREAN SECTION     x2  first pregnancy breach and second normal  . PELVIC FRACTURE SURGERY     endometriosis, remotely    Social History   Socioeconomic History  . Marital status: Married    Spouse name: Not on file  . Number of children: 2  . Years of education: Not on file  . Highest education level: Not on file  Occupational History  . Occupation: retired  Scientific laboratory technician  . Financial resource strain: Not on file  . Food insecurity:    Worry: Not on file    Inability: Not on file  . Transportation needs:    Medical: Not on file    Non-medical: Not on file  Tobacco Use  . Smoking status: Former Research scientist (life sciences)  . Smokeless tobacco: Never Used  . Tobacco comment: Quit >30 yrs ago  Substance and Sexual Activity  . Alcohol use: Yes    Comment: 2 glass wine dialy  . Drug use: No  . Sexual activity: Never    Birth control/protection: None  Lifestyle  . Physical activity:    Days per week: Not on file    Minutes per session: Not on file  . Stress: Not on file  Relationships  . Social connections:    Talks on phone: Not on file    Gets  together: Not on file    Attends religious service: Not on file    Active member of club or organization: Not on file    Attends meetings of clubs or organizations: Not on file    Relationship status: Not on file  . Intimate partner violence:    Fear of current or ex partner: Not on file    Emotionally abused: Not on file    Physically abused: Not on file    Forced sexual activity: Not on file  Other Topics Concern  . 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      Medication List       Accurate as of April 29, 2018  4:07 PM. Always use your most recent med list.        ALPRAZolam 0.25 MG tablet Commonly known as:  XANAX TAKE ONE TO TWO TABLETS BY MOUTH TWICE DAILY AS NEEDED FOR ANXIETY   CENTRUM SILVER PO Take 1 each by mouth daily.   escitalopram 20 MG tablet Commonly known as:  LEXAPRO Take 1 tablet (20 mg total) by mouth daily.   metoprolol tartrate 50 MG tablet Commonly known as:  LOPRESSOR Take 1 tablet (50 mg total) by mouth 2 (two) times daily.   OLANZapine 5 MG tablet Commonly known as:  ZYPREXA TAKE 1 TABLET BY MOUTH 2 TO 3 HOURS PRIOR TO BEDTIME   Oscal 500/200 D-3 500-200 MG-UNIT tablet Generic drug:  calcium-vitamin D Take 1 tablet by mouth daily.   pantoprazole 40 MG tablet Commonly known as:  PROTONIX Take 1 tablet (40 mg total) by mouth daily.           Objective:   Physical Exam LMP 01/22/1992 (Within Months)  This is a video conference, she looks well, alert oriented x3, in no distress    Assessment      Assessment HTN Depression, Anxiety Tremor dx 11-2014 GERD with esophageal stricture L Breast cancer, 1997, lumpectomy, XRT, released from oncology  PLAN: Febrile illness: Had fever for 2 days, now is having malaise, fatigue.  She does not look toxic. She probably had a virus, possibly a mild URI although COVID-19 is in the DDX. At this point we agreed on supportive treatment with fluids, Tylenol, occasional Advil,  Robitussin-DM. ER if severe symptoms.  We are sending the patient a message with our recommendations. We talk about possibly antibiotics but at this point both the patient and me do not believe that is necessary.     I discussed the assessment and treatment plan with the patient. The patient was provided an opportunity to ask questions and all were answered. The patient agreed with the plan and demonstrated an  understanding of the instructions.   The patient was advised to call back or seek an in-person evaluation if the symptoms worsen or if the condition fails to improve as anticipated.

## 2018-04-30 ENCOUNTER — Ambulatory Visit: Payer: Medicare Other | Admitting: Psychiatry

## 2018-04-30 ENCOUNTER — Other Ambulatory Visit: Payer: Self-pay

## 2018-05-05 ENCOUNTER — Encounter: Payer: Self-pay | Admitting: Psychiatry

## 2018-05-05 ENCOUNTER — Other Ambulatory Visit: Payer: Self-pay

## 2018-05-05 ENCOUNTER — Ambulatory Visit (INDEPENDENT_AMBULATORY_CARE_PROVIDER_SITE_OTHER): Payer: Medicare Other | Admitting: Psychiatry

## 2018-05-05 DIAGNOSIS — F1010 Alcohol abuse, uncomplicated: Secondary | ICD-10-CM | POA: Diagnosis not present

## 2018-05-05 DIAGNOSIS — F331 Major depressive disorder, recurrent, moderate: Secondary | ICD-10-CM | POA: Diagnosis not present

## 2018-05-05 DIAGNOSIS — F4001 Agoraphobia with panic disorder: Secondary | ICD-10-CM

## 2018-05-05 DIAGNOSIS — F411 Generalized anxiety disorder: Secondary | ICD-10-CM

## 2018-05-05 NOTE — Progress Notes (Signed)
Monique Ballard [id number] 1940/02/20 78 y.o.  Subjective:   Patient ID:  Monique Ballard is a 78 y.o. (DOB 1940-04-07) female.  Chief Complaint:  Chief Complaint  Patient presents with  . Follow-up    Med management  . Anxiety  . Depression  . Fatigue  . Poor appetite    HPI Monique Ballard presents to the office today for follow-up of depression and anxiety.  1st seen  February 26.  Added Abilify to Lexapro 30.  Stomach ache.  So stopped  And we switched to olanzapine 5 mg and "my mood is much better".  Sleeping better 6-8 hours.  Made quite a difference.  Appetite is still poor.  Have to force myself to eat.  Anxiety a little better but still breathing hard, had this ever since the depression, not new.  Cut down way down on drinking alcohol.  None in the day and 1-2 juice glasses of wine before bed.  Not losing nor gaining weight.  Eats a few bites and not cooking much.  Anxiety is bearable and before it was not.  I still feel like I need help. Not panic.  Disc concerns about counseling.  Wants a magic bullet.    Review of Systems:  Review of Systems  Neurological: Negative for tremors and weakness.  Psychiatric/Behavioral: Positive for dysphoric mood. Negative for agitation, behavioral problems, confusion, hallucinations, self-injury, sleep disturbance and suicidal ideas. The patient is nervous/anxious. The patient is not hyperactive.     Medications: I have reviewed the patient's current medications.  Current Outpatient Medications  Medication Sig Dispense Refill  . ALPRAZolam (XANAX) 0.25 MG tablet TAKE ONE TO TWO TABLETS BY MOUTH TWICE DAILY AS NEEDED FOR ANXIETY 60 tablet 0  . calcium-vitamin D (OSCAL 500/200 D-3) 500-200 MG-UNIT per tablet Take 1 tablet by mouth daily.      Marland Kitchen escitalopram (LEXAPRO) 20 MG tablet Take 1 tablet (20 mg total) by mouth daily. 90 tablet 1  . metoprolol tartrate (LOPRESSOR) 50 MG tablet Take 1 tablet (50 mg total) by mouth 2 (two) times  daily. 180 tablet 1  . Multiple Vitamins-Minerals (CENTRUM SILVER PO) Take 1 each by mouth daily.      Marland Kitchen OLANZapine (ZYPREXA) 5 MG tablet TAKE 1 TABLET BY MOUTH 2 TO 3 HOURS PRIOR TO BEDTIME 30 tablet 0  . pantoprazole (PROTONIX) 40 MG tablet Take 1 tablet (40 mg total) by mouth daily. 90 tablet 3   No current facility-administered medications for this visit.     Medication Side Effects: None  Allergies:  Allergies  Allergen Reactions  . Penicillins Rash    Has patient had a PCN reaction causing immediate rash, facial/tongue/throat swelling, SOB or lightheadedness with hypotension: Yes Has patient had a PCN reaction causing severe rash involving mucus membranes or skin necrosis: No Has patient had a PCN reaction that required hospitalization No Has patient had a PCN reaction occurring within the last 10 years: No If all of the above answers are "NO", then may proceed with Cephalosporin use.     Past Medical History:  Diagnosis Date  . Breast cancer ([place]) fall 1997    Left Lumpectomy, XRT.  treated with Tamoxifem and Evista  . Depression   . Endometriosis in her 20's   surgery to help clear endometriosis to obtain a pregnancy.  Marland Kitchen GERD (gastroesophageal reflux disease)    w/"stretching" remotely  . HTN (hypertension)     Family History  Problem Relation Age of Onset  .  Hypertension Mother   . Stroke Mother   . Heart attack Father        elderly  . Heart disease Father        CHF  . Throat cancer Other   . Breast cancer Neg Hx   . Colon cancer Neg Hx   . Diabetes Neg Hx     Social History   Socioeconomic History  . Marital status: Married    Spouse name: Not on file  . Number of children: 2  . Years of education: Not on file  . Highest education level: Not on file  Occupational History  . Occupation: retired  Scientific laboratory technician  . Financial resource strain: Not on file  . Food insecurity:    Worry: Not on file    Inability: Not on file  . Transportation needs:     Medical: Not on file    Non-medical: Not on file  Tobacco Use  . Smoking status: Former Research scientist (life sciences)  . Smokeless tobacco: Never Used  . Tobacco comment: Quit >30 yrs ago  Substance and Sexual Activity  . Alcohol use: Yes    Comment: 2 glass wine dialy  . Drug use: No  . Sexual activity: Never    Birth control/protection: None  Lifestyle  . Physical activity:    Days per week: Not on file    Minutes per session: Not on file  . Stress: Not on file  Relationships  . Social connections:    Talks on phone: Not on file    Gets together: Not on file    Attends religious service: Not on file    Active member of club or organization: Not on file    Attends meetings of clubs or organizations: Not on file    Relationship status: Not on file  . Intimate partner violence:    Fear of current or ex partner: Not on file    Emotionally abused: Not on file    Physically abused: Not on file    Forced sexual activity: Not on file  Other Topics Concern  . 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   Please see review of systems for further details on the patient's review from today.   Objective:   Physical Exam:  LMP 01/22/1992 (Within Months)   Physical Exam Neurological:     Mental Status: She is alert and oriented to person, place, and time.     Cranial Nerves: No dysarthria.  Psychiatric:        Attention and Perception: Attention normal. She does not perceive auditory hallucinations.        Mood and Affect: Mood is anxious and depressed.        Speech: Speech normal.        Behavior: Behavior is cooperative.        Thought Content: Thought  content normal. Thought content is not paranoid or delusional. Thought content does not include homicidal or suicidal ideation. Thought content does not include homicidal or suicidal plan.        Cognition and Memory: Cognition and memory normal.  Judgment: Judgment normal.     Comments: Insight fair.     Lab Review:     Component Value Date/Time   NA 135 01/01/2018 1044   K 5.0 01/01/2018 1044   CL 100 01/01/2018 1044   CO2 30 01/01/2018 1044   GLUCOSE 134 (H) 01/01/2018 1044   BUN 7 01/01/2018 1044   CREATININE 0.64 01/01/2018 1044   CREATININE 0.79 12/22/2015 1503   CALCIUM 9.4 01/01/2018 1044   PROT 6.9 08/22/2017 1336   ALBUMIN 3.9 08/22/2017 1336   AST 41 (H) 08/22/2017 1336   ALT 25 08/22/2017 1336   ALKPHOS 71 08/22/2017 1336   BILITOT 0.6 08/22/2017 1336   GFRNONAA 68.20 08/28/2009 0909   GFRAA 81 08/18/2007 0000       Component Value Date/Time   WBC 11.2 (H) 03/10/2017 1459   RBC 4.38 03/10/2017 1459   HGB 14.6 03/10/2017 1459   HCT 43.5 03/10/2017 1459   PLT 235.0 03/10/2017 1459   MCV 99.2 03/10/2017 1459   MCHC 33.5 03/10/2017 1459   RDW 12.7 03/10/2017 1459   LYMPHSABS 2.1 03/10/2017 1459   MONOABS 1.8 (H) 03/10/2017 1459   EOSABS 0.1 03/10/2017 1459   BASOSABS 0.1 03/10/2017 1459    No results found for: POCLITH, LITHIUM   No results found for: PHENYTOIN, PHENOBARB, VALPROATE, CBMZ   .res Assessment: Plan:    Major depressive disorder, recurrent episode, moderate (HCC)  Panic disorder with agoraphobia  Generalized anxiety disorder  Excessive drinking alcohol   She has had a severe and prolonged depression and anxiety episode lasting since March 2019 triggered by behavior of her son who embezzled money and got fired from a job.  She has not been overpull to get over her disappointment about him and now he is living with them.  She has had a partial response with the addition of olanzapine.  She did not tolerate Abilify.  Eat enough  protein or else meds won't work.  Drinks protein shakes.    Option increase olanzapine, mirtazapine.  She has had a partial improvement in the olanzapine but did not see the usual appetite benefit.  She has had no side effects.  Therefore, increase Olanzapine to 7.5 mg in evening.   Discussed potential metabolic side effects associated with atypical antipsychotics, as well as potential risk for movement side effects. Advised pt to contact office if movement side effects occur.   If no improvement further after 2-3 weeks then call and consider mirtazapine or increasing the olanzapine further.  Discussed in detail specific counseling techniques such as grief techniques and cognitive behavior therapy to help her deal with the incident with her son which has precipitated this depression.  She is had some counseling.  We discussed the attributes of the counselors that she found helpful and not helpful as a way of picking another therapist.  Strongly encourage counseling as noted last time.  Has seen Oliver Pila, [facility], and another.  Butch Penny was helpful, but not covered by Medicare.   She wants to see a new therapist and will refer to Rinaldo Cloud, [place].  This appt was 30 mins.  FU  6weeks  I connected with patient by a video enabled telemedicine application WebEx, with their informed consent, and verified patient privacy and that I am speaking with the correct person using two identifiers.  I was located at office and patient at home.  Lynder Parents, MD, DFAPA   Please see After Visit Summary for patient specific instructions.  Future Appointments  Date Time Provider [place]  10/02/2018  1:00 PM Colon Branch, MD [facility]  04/26/2019  2:00 PM Dennis Bast, RN [facility]    No orders of the defined types were placed in this encounter.     -------------------------------

## 2018-05-11 ENCOUNTER — Other Ambulatory Visit: Payer: Self-pay | Admitting: Psychiatry

## 2018-05-11 ENCOUNTER — Encounter: Payer: Self-pay | Admitting: Internal Medicine

## 2018-05-12 ENCOUNTER — Encounter: Payer: Self-pay | Admitting: Internal Medicine

## 2018-05-12 ENCOUNTER — Other Ambulatory Visit: Payer: Self-pay

## 2018-05-12 ENCOUNTER — Emergency Department (HOSPITAL_BASED_OUTPATIENT_CLINIC_OR_DEPARTMENT_OTHER)
Admission: EM | Admit: 2018-05-12 | Discharge: 2018-05-12 | Disposition: A | Discharge status: Home / Self Care | Payer: Medicare Other | Attending: Emergency Medicine | Admitting: Emergency Medicine

## 2018-05-12 ENCOUNTER — Emergency Department (HOSPITAL_BASED_OUTPATIENT_CLINIC_OR_DEPARTMENT_OTHER): Payer: Medicare Other

## 2018-05-12 ENCOUNTER — Telehealth: Payer: Self-pay | Admitting: Internal Medicine

## 2018-05-12 ENCOUNTER — Ambulatory Visit: Payer: Medicare Other | Admitting: Internal Medicine

## 2018-05-12 ENCOUNTER — Encounter (HOSPITAL_BASED_OUTPATIENT_CLINIC_OR_DEPARTMENT_OTHER): Payer: Self-pay | Admitting: *Deleted

## 2018-05-12 DIAGNOSIS — R531 Weakness: Secondary | ICD-10-CM

## 2018-05-12 DIAGNOSIS — Z79899 Other long term (current) drug therapy: Secondary | ICD-10-CM | POA: Insufficient documentation

## 2018-05-12 DIAGNOSIS — Z87891 Personal history of nicotine dependence: Secondary | ICD-10-CM | POA: Diagnosis not present

## 2018-05-12 DIAGNOSIS — R945 Abnormal results of liver function studies: Secondary | ICD-10-CM | POA: Diagnosis not present

## 2018-05-12 DIAGNOSIS — R197 Diarrhea, unspecified: Secondary | ICD-10-CM | POA: Insufficient documentation

## 2018-05-12 DIAGNOSIS — R1110 Vomiting, unspecified: Secondary | ICD-10-CM | POA: Diagnosis not present

## 2018-05-12 DIAGNOSIS — R05 Cough: Secondary | ICD-10-CM | POA: Insufficient documentation

## 2018-05-12 DIAGNOSIS — I10 Essential (primary) hypertension: Secondary | ICD-10-CM | POA: Insufficient documentation

## 2018-05-12 DIAGNOSIS — M6281 Muscle weakness (generalized): Secondary | ICD-10-CM | POA: Insufficient documentation

## 2018-05-12 DIAGNOSIS — Z20828 Contact with and (suspected) exposure to other viral communicable diseases: Secondary | ICD-10-CM | POA: Diagnosis not present

## 2018-05-12 DIAGNOSIS — K760 Fatty (change of) liver, not elsewhere classified: Secondary | ICD-10-CM | POA: Diagnosis not present

## 2018-05-12 DIAGNOSIS — R748 Abnormal levels of other serum enzymes: Secondary | ICD-10-CM | POA: Insufficient documentation

## 2018-05-12 DIAGNOSIS — R5383 Other fatigue: Secondary | ICD-10-CM

## 2018-05-12 DIAGNOSIS — R0602 Shortness of breath: Secondary | ICD-10-CM | POA: Diagnosis not present

## 2018-05-12 LAB — COMPREHENSIVE METABOLIC PANEL
ALT: 39 U/L (ref 0–44)
AST: 78 U/L — ABNORMAL HIGH (ref 15–41)
Albumin: 2.8 g/dL — ABNORMAL LOW (ref 3.5–5.0)
Alkaline Phosphatase: 148 U/L — ABNORMAL HIGH (ref 38–126)
Anion gap: 8 (ref 5–15)
BUN: 8 mg/dL (ref 8–23)
CO2: 22 mmol/L (ref 22–32)
Calcium: 8.6 mg/dL — ABNORMAL LOW (ref 8.9–10.3)
Chloride: 102 mmol/L (ref 98–111)
Creatinine, Ser: 0.64 mg/dL (ref 0.44–1.00)
GFR calc Af Amer: >60 mL/min (ref >60)
GFR calc non Af Amer: >60 mL/min (ref >60)
Glucose, Bld: 118 mg/dL — ABNORMAL HIGH (ref 70–99)
Potassium: 4.1 mmol/L (ref 3.5–5.1)
Sodium: 132 mmol/L — ABNORMAL LOW (ref 135–145)
Total Bilirubin: 1.9 mg/dL — ABNORMAL HIGH (ref 0.3–1.2)
Total Protein: 6.4 g/dL — ABNORMAL LOW (ref 6.5–8.1)

## 2018-05-12 LAB — CBC WITH DIFFERENTIAL/PLATELET
Abs Immature Granulocytes: 0.2 10*3/uL — ABNORMAL HIGH (ref 0.00–0.07)
Basophils Absolute: 0.2 10*3/uL — ABNORMAL HIGH (ref 0.0–0.1)
Basophils Relative: 1 %
Eosinophils Absolute: 0.2 10*3/uL (ref 0.0–0.5)
Eosinophils Relative: 2 %
HCT: 36.8 % (ref 36.0–46.0)
Hemoglobin: 12.1 g/dL (ref 12.0–15.0)
Immature Granulocytes: 1 %
Lymphocytes Relative: 19 %
Lymphs Abs: 2.8 10*3/uL (ref 0.7–4.0)
MCH: 33.1 pg (ref 26.0–34.0)
MCHC: 32.9 g/dL (ref 30.0–36.0)
MCV: 100.5 fL — ABNORMAL HIGH (ref 80.0–100.0)
Monocytes Absolute: 2.2 10*3/uL — ABNORMAL HIGH (ref 0.1–1.0)
Monocytes Relative: 15 %
Neutro Abs: 9.2 10*3/uL — ABNORMAL HIGH (ref 1.7–7.7)
Neutrophils Relative %: 62 %
Platelets: 348 10*3/uL (ref 150–400)
RBC: 3.66 MIL/uL — ABNORMAL LOW (ref 3.87–5.11)
RDW: 15.5 % (ref 11.5–15.5)
WBC: 14.8 10*3/uL — ABNORMAL HIGH (ref 4.0–10.5)
nRBC: 0 % (ref 0.0–0.2)

## 2018-05-12 LAB — POCT I-STAT 7, (LYTES, BLD GAS, ICA,H+H)
Acid-base deficit: 3 mmol/L — ABNORMAL HIGH (ref 0.0–2.0)
Bicarbonate: 20.6 mmol/L (ref 20.0–28.0)
Calcium, Ion: 1.23 mmol/L (ref 1.15–1.40)
HCT: 32 % — ABNORMAL LOW (ref 36.0–46.0)
Hemoglobin: 10.9 g/dL — ABNORMAL LOW (ref 12.0–15.0)
O2 Saturation: 96 %
Potassium: 3.8 mmol/L (ref 3.5–5.1)
Sodium: 136 mmol/L (ref 135–145)
TCO2: 22 mmol/L (ref 22–32)
pCO2 arterial: 32.7 mmHg (ref 32.0–48.0)
pH, Arterial: 7.407 (ref 7.350–7.450)
pO2, Arterial: 78 mmHg — ABNORMAL LOW (ref 83.0–108.0)

## 2018-05-12 LAB — ETHANOL: Alcohol, Ethyl (B): 46 mg/dL — ABNORMAL HIGH (ref <10)

## 2018-05-12 LAB — TROPONIN I: Troponin I: <0.03 ng/mL (ref <0.03)

## 2018-05-12 LAB — AMMONIA: Ammonia: 27 umol/L (ref 9–35)

## 2018-05-12 LAB — MAGNESIUM: Magnesium: 1.8 mg/dL (ref 1.7–2.4)

## 2018-05-12 IMAGING — CT CT ABDOMEN AND PELVIS WITH CONTRAST
2 of 5 series · 16 of 46 positions shown, 18 images · IV contrast (APPLIED)
Comparison: None.

CLINICAL DATA: Abnormal LFTs. Shortness of breath, cough, weakness
for the past month. History of left breast cancer.

EXAM:
CT ABDOMEN AND PELVIS WITH CONTRAST
TECHNIQUE: Multidetector CT imaging of the abdomen and pelvis was performed
using the standard protocol following bolus administration of
intravenous contrast.
CONTRAST:  100mL OMNIPAQUE IOHEXOL 300 MG/ML  SOLN

[Series 2: axial st · axial · 0.93mm/px · z∈[-556,-141]mm · 13 of 95 slices shown, 15 images]
[im 6/95  soft-tissue]
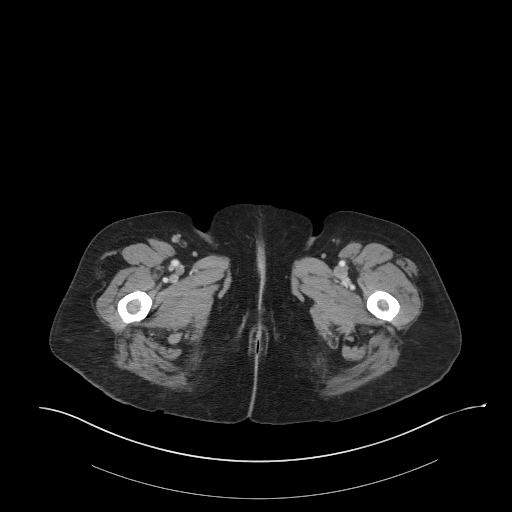
[im 6/95  bone]
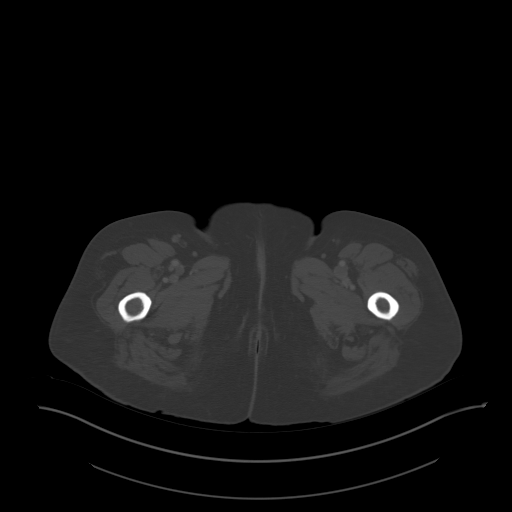
[im 11/95  soft-tissue]
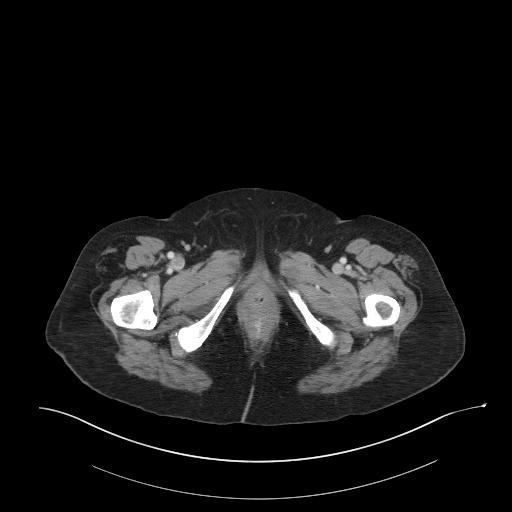
[im 21/95  soft-tissue]
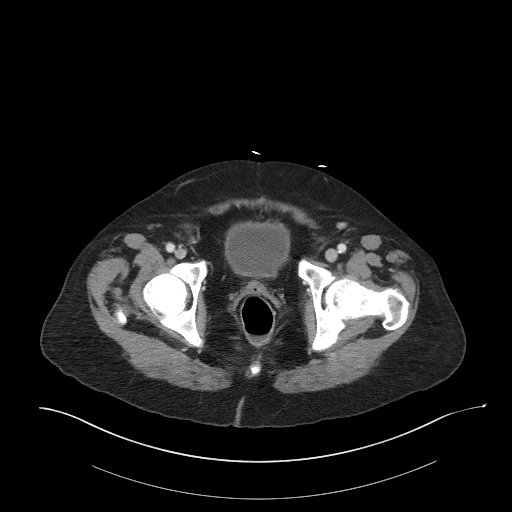
[im 27/95  soft-tissue]
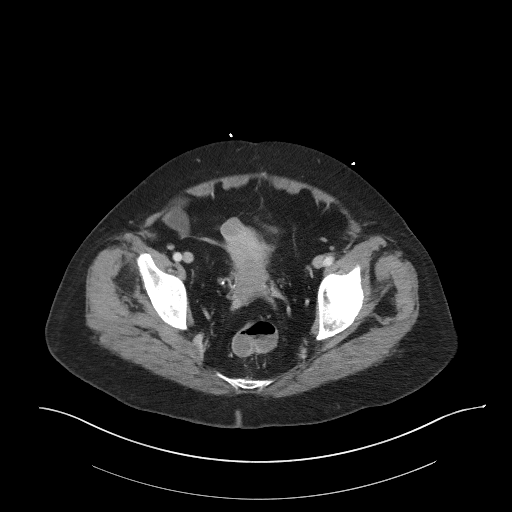
[im 32/95  soft-tissue]
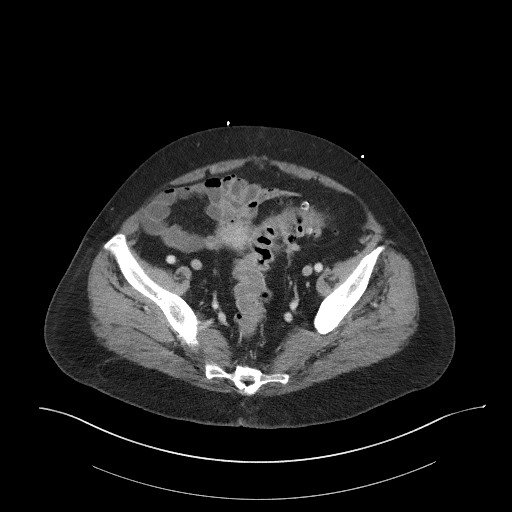
[im 42/95  soft-tissue]
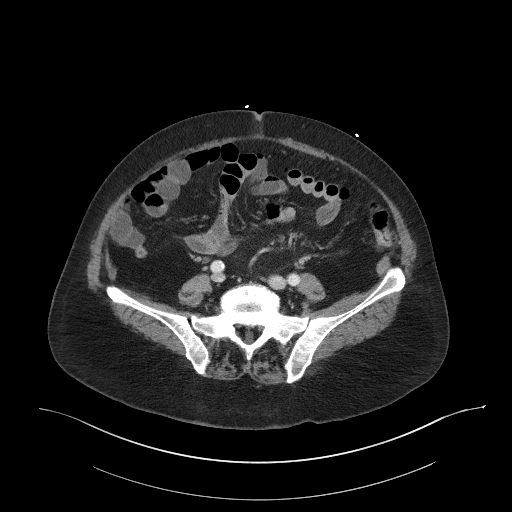
[im 48/95  soft-tissue]
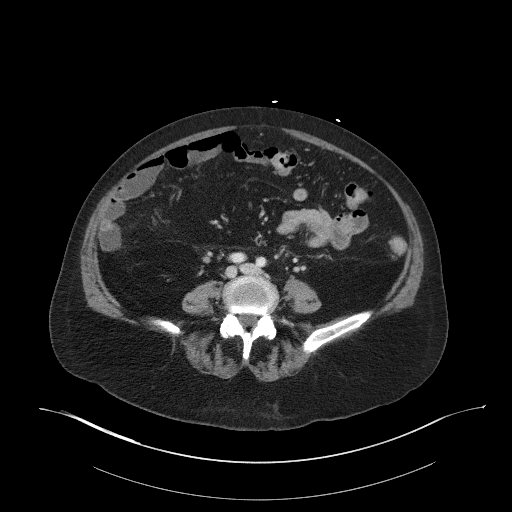
[im 53/95  soft-tissue]
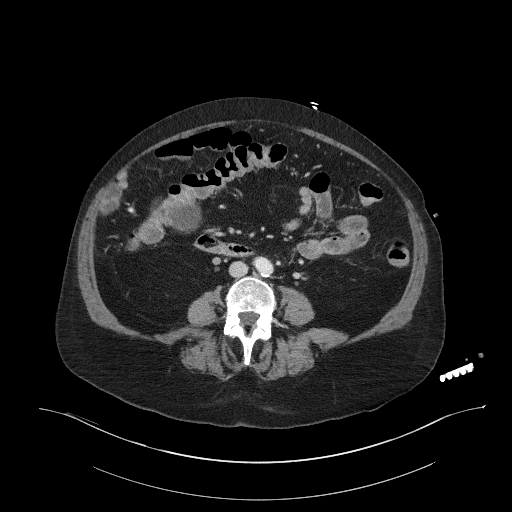
[im 63/95  soft-tissue]
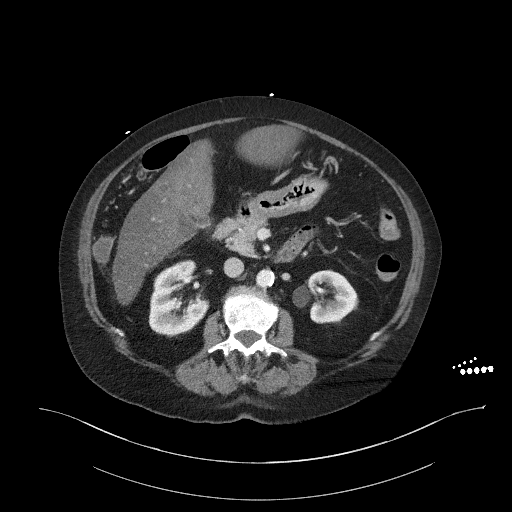
[im 63/95  bone]
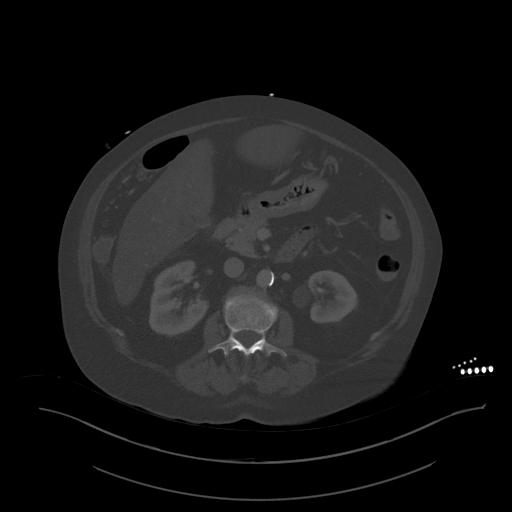
[im 68/95  soft-tissue]
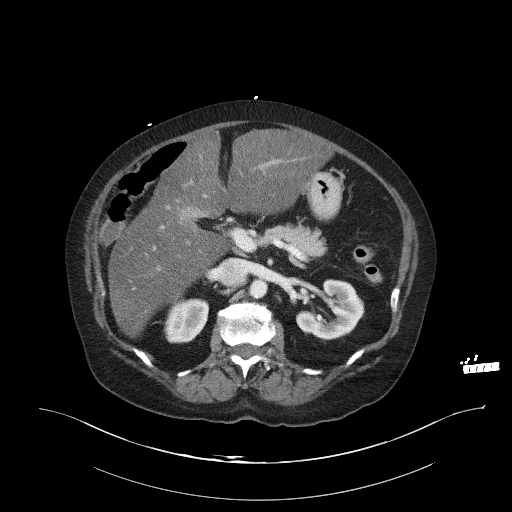
[im 74/95  soft-tissue]
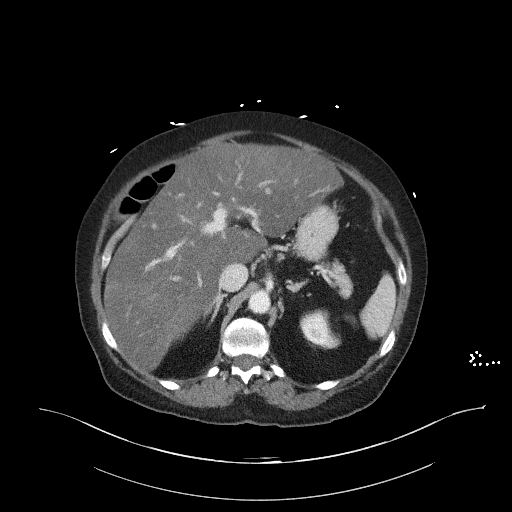
[im 84/95  soft-tissue]
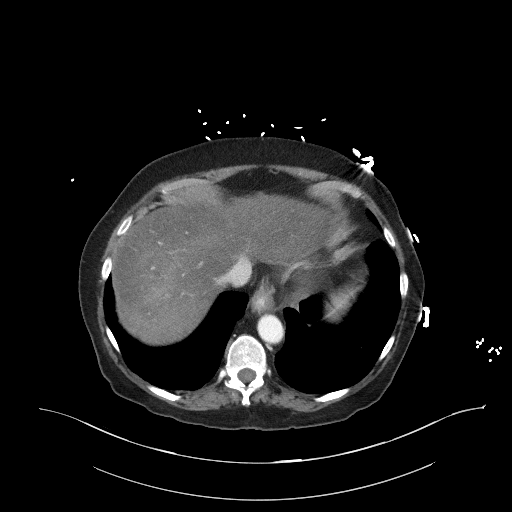
[im 89/95  soft-tissue]
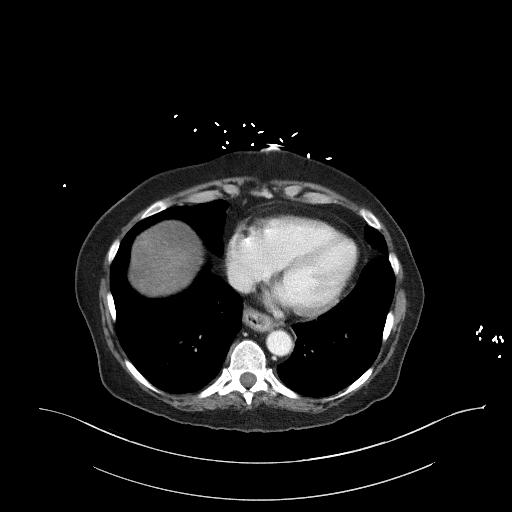

[Series 4: coronal st · coronal · 0.79mm/px · 3 of 103 slices shown]
[im 35/103  soft-tissue]
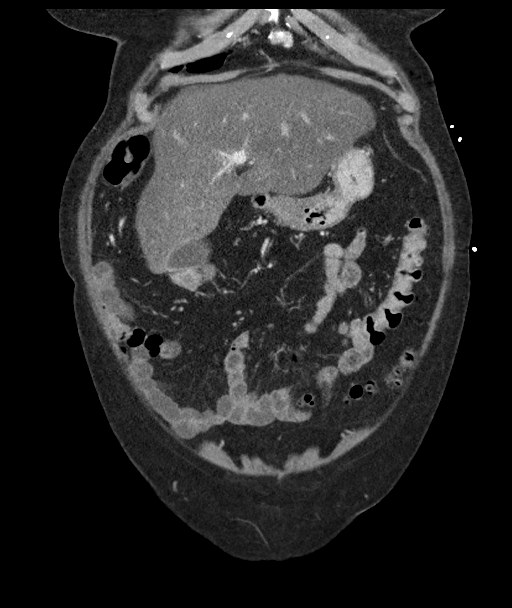
[im 46/103  soft-tissue]
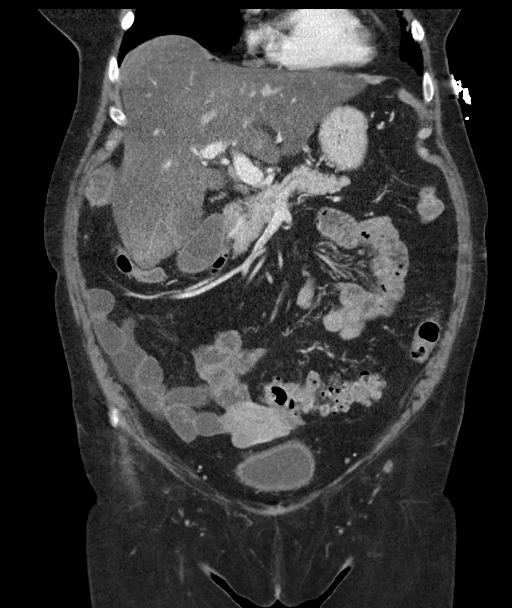
[im 57/103  soft-tissue]
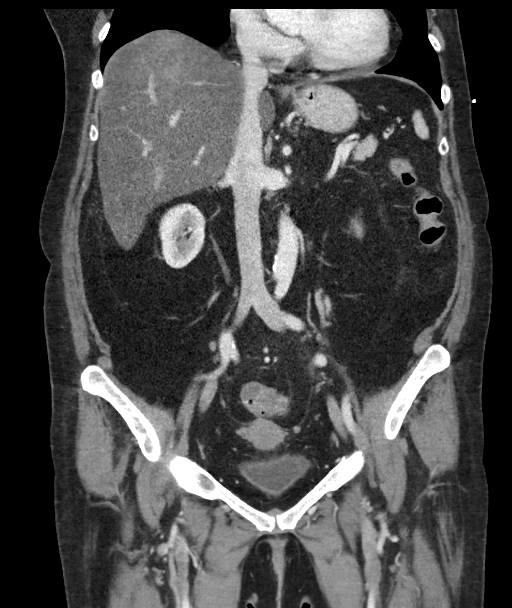

[16 of 46 positions shown; findings below may reference images not displayed]

FINDINGS: Lower chest: No acute abnormality.

Hepatobiliary: Severe hepatic steatosis. Focal fatty sparing along
segments 4B/5. Mild nonfocal heterogeneity of the right hepatic
lobe. No focal liver abnormality. The gallbladder is unremarkable.
No biliary dilatation.

Pancreas: Unremarkable. No pancreatic ductal dilatation or
surrounding inflammatory changes.

Spleen: Normal in size. Subcentimeter low-density lesion in the
central spleen is too small to characterize.

Adrenals/Urinary Tract: Adrenal glands are unremarkable. Kidneys are
normal, without renal calculi, focal lesion, or hydronephrosis.
Bladder is unremarkable for the degree of distention.

Stomach/Bowel: Small hiatal hernia. The stomach is decompressed but
otherwise within normal limits. No bowel wall thickening,
distention, or surrounding inflammatory changes. Moderate sigmoid
diverticulosis. The appendix is not clearly visualized, but there
are no signs of inflammation at the base of the cecum.

Vascular/Lymphatic: Aortic atherosclerosis. No enlarged abdominal or
pelvic lymph nodes.

Reproductive: Small uterine fundal subserosal fibroid. No adnexal
mass.

Other: No abdominal wall hernia or abnormality. No abdominopelvic
ascites. No pneumoperitoneum.

Musculoskeletal: No acute or significant osseous findings.
IMPRESSION: 1.  No acute intra-abdominal process.
2. Severe hepatic steatosis.
3.  Aortic atherosclerosis ([A6]-[A6]).

## 2018-05-12 IMAGING — DX PORTABLE CHEST - 1 VIEW
1 series · 1 of 1 positions shown · non-contrast
Comparison: Chest x-ray dated [DATE].

CLINICAL DATA: Shortness of breath, fever, and cough for the past
month.

EXAM:
PORTABLE CHEST 1 VIEW

[chest ap]
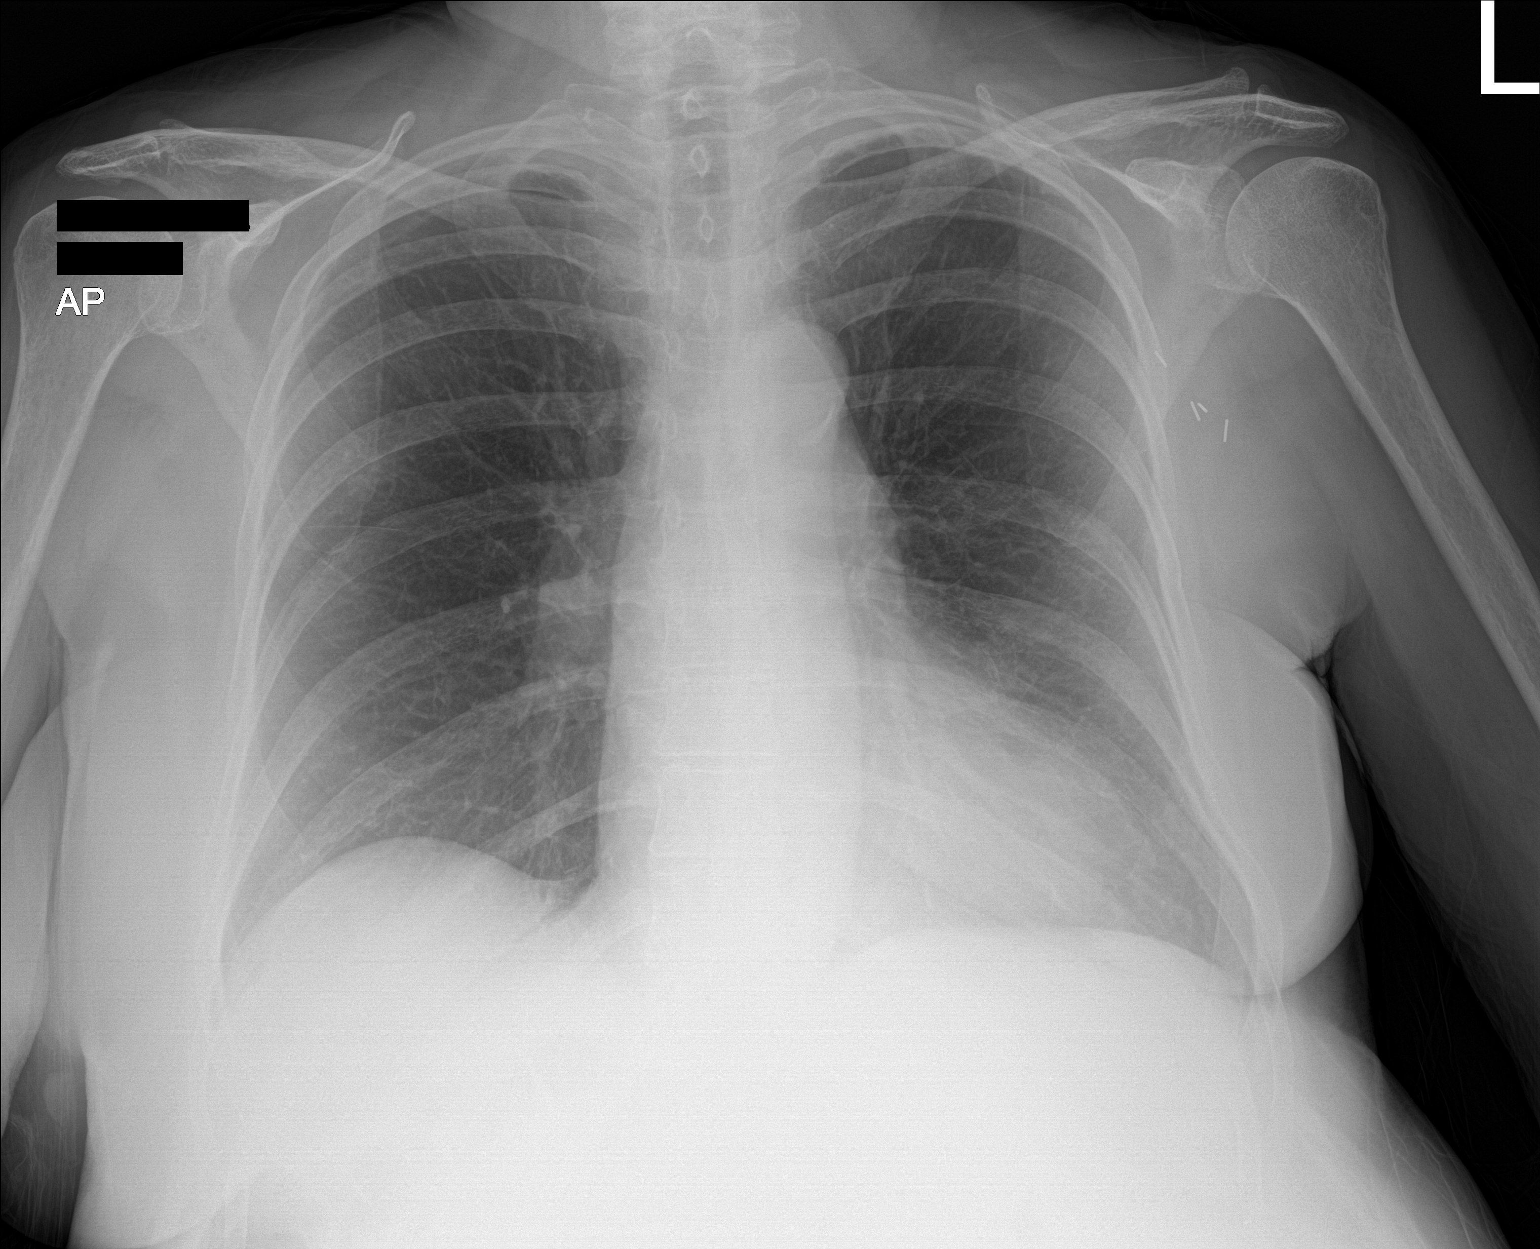

[1 of 1 positions shown; findings below may reference images not displayed]

FINDINGS: The heart size and mediastinal contours are within normal limits.
Atherosclerotic calcification of the aortic arch. Normal pulmonary
vascularity. No focal consolidation, pleural effusion, or
pneumothorax. No acute osseous abnormality. Unchanged surgical clips
in the left axilla.
IMPRESSION: No active disease.

## 2018-05-12 MED ORDER — IOHEXOL 300 MG/ML  SOLN
100.0000 mL | Freq: Once | INTRAMUSCULAR | Status: AC | PRN
  Administered 2018-05-12: 100 mL via INTRAVENOUS

## 2018-05-12 MED ORDER — SODIUM CHLORIDE 0.9 % IV BOLUS
1000.0000 mL | Freq: Once | INTRAVENOUS | Status: AC
  Administered 2018-05-12: 1000 mL via INTRAVENOUS

## 2018-05-12 MED ORDER — ONDANSETRON 4 MG PO TBDP: ORAL_TABLET | ORAL | 0 refills | Status: DC

## 2018-05-12 NOTE — Telephone Encounter (Signed)
I spoke with Monique Ballard at [place] request.  She calls her mother almost daily and is tracking her symptoms. She reports the patient is not doing well for the last 6 to 8 weeks: Diarrhea, vomiting, decreased appetite, weakness, very sleepy. Emotionally she still is anxious and depressed but Monique Ballard is also concerned about her physical symptoms. She still apparently drinks etoh, possibly more than what she is letting us know. She has gained weight and her abdomen is swollen. Household  is the patient, her husband and her son.  Although the son is a source of stress, at this point he seems to be a positive thing for them  because he is helping a lot at the house. No recent fever chills or cough I need to see the patient face-to-face today for further eval.

## 2018-05-12 NOTE — ED Notes (Signed)
Pt made aware of need for urine specimen, unable to provide at this time.

## 2018-05-12 NOTE — Telephone Encounter (Addendum)
Monique Ballard. [place]  Work [phone]  Cell [phone]   Email: [email or web address]  Called LMOM @ 9.50 am

## 2018-05-12 NOTE — ED Notes (Signed)
Pt verbalizes understanding of d/c instructions and denies any further needs at this time. 

## 2018-05-12 NOTE — Discharge Instructions (Addendum)
Try imodium for diarrhea.  Zofran for nausea.  Your chest x-ray and CT scan did not show any acute abnormalities. Return for worsening weakness, inability to eat or drink, worsening shortness of breath or fever.       Person Under Monitoring Name: Monique Ballard  Location: [facility] [id number]   Infection Prevention Recommendations for Individuals Confirmed to have, or Being Evaluated for, 2019 Novel Coronavirus (COVID-19) Infection Who Receive Care at Home  Individuals who are confirmed to have, or are being evaluated for, COVID-19 should follow the prevention steps below until a healthcare provider or local or state health department says they can return to normal activities.  Stay home except to get medical care You should restrict activities outside your home, except for getting medical care. Do not go to work, school, or public areas, and do not use public transportation or taxis.  Call ahead before visiting your doctor Before your medical appointment, call the healthcare provider and tell them that you have, or are being evaluated for, COVID-19 infection. This will help the healthcare providers office take steps to keep other people from getting infected. Ask your healthcare provider to call the local or state health department.  Monitor your symptoms Seek prompt medical attention if your illness is worsening (e.g., difficulty breathing). Before going to your medical appointment, call the healthcare provider and tell them that you have, or are being evaluated for, COVID-19 infection. Ask your healthcare provider to call the local or state health department.  Wear a facemask You should wear a facemask that covers your nose and mouth when you are in the same room with other people and when you visit a healthcare provider. People who live with or visit you should also wear a facemask while they are in the same room with you.  Separate yourself from other  people in your home As much as possible, you should stay in a different room from other people in your home. Also, you should use a separate bathroom, if available.  Avoid sharing household items You should not share dishes, drinking glasses, cups, eating utensils, towels, bedding, or other items with other people in your home. After using these items, you should wash them thoroughly with soap and water.  Cover your coughs and sneezes Cover your mouth and nose with a tissue when you cough or sneeze, or you can cough or sneeze into your sleeve. Throw used tissues in a lined trash can, and immediately wash your hands with soap and water for at least 20 seconds or use an alcohol-based hand rub.  Wash your [facility] your hands often and thoroughly with soap and water for at least 20 seconds. You can use an alcohol-based hand sanitizer if soap and water are not available and if your hands are not visibly dirty. Avoid touching your eyes, nose, and mouth with unwashed hands.   Prevention Steps for Caregivers and Household Members of Individuals Confirmed to have, or Being Evaluated for, COVID-19 Infection Being Cared for in the Home  If you live with, or provide care at home for, a person confirmed to have, or being evaluated for, COVID-19 infection please follow these guidelines to prevent infection:  Follow healthcare providers instructions Make sure that you understand and can help the patient follow any healthcare provider instructions for all care.  Provide for the patients basic needs You should help the patient with basic needs in the home and provide support for getting groceries, prescriptions,  and other personal needs.  Monitor the patients symptoms If they are getting sicker, call his or her medical provider and tell them that the patient has, or is being evaluated for, COVID-19 infection. This will help the healthcare providers office take steps to keep other people from  getting infected. Ask the healthcare provider to call the local or state health department.  Limit the number of people who have contact with the patient If possible, have only one caregiver for the patient. Other household members should stay in another home or place of residence. If this is not possible, they should stay in another room, or be separated from the patient as much as possible. Use a separate bathroom, if available. Restrict visitors who do not have an essential need to be in the home.  Keep older adults, very young children, and other sick people away from the patient Keep older adults, very young children, and those who have compromised immune systems or chronic health conditions away from the patient. This includes people with chronic heart, lung, or kidney conditions, diabetes, and cancer.  Ensure good ventilation Make sure that shared spaces in the home have good air flow, such as from an air conditioner or an opened window, weather permitting.  Wash your hands often Wash your hands often and thoroughly with soap and water for at least 20 seconds. You can use an alcohol based hand sanitizer if soap and water are not available and if your hands are not visibly dirty. Avoid touching your eyes, nose, and mouth with unwashed hands. Use disposable paper towels to dry your hands. If not available, use dedicated cloth towels and replace them when they become wet.  Wear a facemask and gloves Wear a disposable facemask at all times in the room and gloves when you touch or have contact with the patients blood, body fluids, and/or secretions or excretions, such as sweat, saliva, sputum, nasal mucus, vomit, urine, or feces.  Ensure the mask fits over your nose and mouth tightly, and do not touch it during use. Throw out disposable facemasks and gloves after using them. Do not reuse. Wash your hands immediately after removing your facemask and gloves. If your personal clothing  becomes contaminated, carefully remove clothing and launder. Wash your hands after handling contaminated clothing. Place all used disposable facemasks, gloves, and other waste in a lined container before disposing them with other household waste. Remove gloves and wash your hands immediately after handling these items.  Do not share dishes, glasses, or other household items with the patient Avoid sharing household items. You should not share dishes, drinking glasses, cups, eating utensils, towels, bedding, or other items with a patient who is confirmed to have, or being evaluated for, COVID-19 infection. After the person uses these items, you should wash them thoroughly with soap and water.  Wash laundry thoroughly Immediately remove and wash clothes or bedding that have blood, body fluids, and/or secretions or excretions, such as sweat, saliva, sputum, nasal mucus, vomit, urine, or feces, on them. Wear gloves when handling laundry from the patient. Read and follow directions on labels of laundry or clothing items and detergent. In general, wash and dry with the warmest temperatures recommended on the label.  Clean all areas the individual has used often Clean all touchable surfaces, such as counters, tabletops, doorknobs, bathroom fixtures, toilets, phones, keyboards, tablets, and bedside tables, every day. Also, clean any surfaces that may have blood, body fluids, and/or secretions or excretions on them. Wear gloves when  cleaning surfaces the patient has come in contact with. Use a diluted bleach solution (e.g., dilute bleach with 1 part bleach and 10 parts water) or a household disinfectant with a label that says EPA-registered for coronaviruses. To make a bleach solution at home, add 1 tablespoon of bleach to 1 quart (4 cups) of water. For a larger supply, add  cup of bleach to 1 gallon (16 cups) of water. Read labels of cleaning products and follow recommendations provided on product labels.  Labels contain instructions for safe and effective use of the cleaning product including precautions you should take when applying the product, such as wearing gloves or eye protection and making sure you have good ventilation during use of the product. Remove gloves and wash hands immediately after cleaning.  Monitor yourself for signs and symptoms of illness Caregivers and household members are considered close contacts, should monitor their health, and will be asked to limit movement outside of the home to the extent possible. Follow the monitoring steps for close contacts listed on the symptom monitoring form.   ? If you have additional questions, contact your local health department or call the epidemiologist on call at [phone] (available 24/7). ? This guidance is subject to change. For the most up-to-date guidance from [facility], please refer to their website: [email or web address]

## 2018-05-12 NOTE — ED Triage Notes (Signed)
SOB, cough and weakness x 4 weeks. She has been treated by Dr Larose Kells with no improvement. She was told to come to the ER. No COVID 19 exposures.

## 2018-05-12 NOTE — ED Provider Notes (Signed)
[facility] Provider Note   CSN: [id number] Arrival date & time: 05/12/18  1255    History   Chief Complaint Chief Complaint  Patient presents with   Shortness of Breath   Cough   Weakness    HPI Monique Ballard is a 78 y.o. female.     78 yo F with a chief complaints of cough congestion vomiting and diarrhea and generalized weakness.  This been going on for at least the past couple weeks.  She is seen her family doctor at the onset of the illness she initially had fevers which resolved.  At that time she had a chest x-ray that was negative.  She is continued to cough fairly persistently.  She denies any chest pain.  She is most concerned about her diarrhea which happened suddenly.  She is started to wear depends because she does not make it to the bathroom in time.  She denies any back pain with this denies dysuria gross frequency or hesitancy.  She called her family doctor yesterday and then had a virtual appointment today and was told to come to the ED for evaluation.  His note was concerned for possible COVID-19 with her persistent cough and fatigue.  The patient also fell yesterday, she states that this was she got up out of a chair and then felt like her legs were too weak to hold her and she sat on the ground.  She denies any injury in the fall.  She however was unable to get up after that and her husband and son had to help her to her feet.  She denies any sick contacts.  She has been going through a period of depression with her oldest son having lost his job and living back in the house with her and her husband.  She had recently seen a psychiatrist and was started on olanzapine.  Her dose was recently increased.  She been describing increasing fatigue.  Describes bilateral lower extremity weakness.  She has had physical therapy for this in the past.  The history is provided by the patient.  Shortness of Breath  Associated symptoms: abdominal  pain, cough and vomiting   Associated symptoms: no chest pain, no fever, no headaches and no wheezing   Cough  Associated symptoms: shortness of breath   Associated symptoms: no chest pain, no chills, no fever, no headaches, no myalgias, no rhinorrhea and no wheezing   Weakness  Associated symptoms: abdominal pain, cough, diarrhea, nausea, shortness of breath and vomiting   Associated symptoms: no arthralgias, no chest pain, no dizziness, no dysuria, no fever, no headaches, no myalgias and no urgency   Illness  Severity:  Mild Onset quality:  Gradual Duration:  2 weeks Timing:  Constant Progression:  Worsening Chronicity:  New Associated symptoms: abdominal pain, cough, diarrhea, nausea, shortness of breath and vomiting   Associated symptoms: no chest pain, no congestion, no fever, no headaches, no myalgias, no rhinorrhea and no wheezing     Past Medical History:  Diagnosis Date   Breast cancer ([facility]) fall 1997    Left Lumpectomy, XRT.  treated with Tamoxifem and Evista   Depression    Endometriosis in her 20's   surgery to help clear endometriosis to obtain a pregnancy.   GERD (gastroesophageal reflux disease)    w/"stretching" remotely   HTN (hypertension)     Patient Active Problem List   Diagnosis Date Noted   Tremor 02/24/2015   Gait disorder 02/24/2015  PCP NOTES >>>>>>> 12/13/2014   Abdominal wall strain 03/19/2012   Osteopenia 10/04/2011   Annual physical exam 09/07/2010   GERD 08/28/2009   Anxiety and depression 10/06/2006   Essential hypertension 10/06/2006   BREAST CANCER, HX OF 10/06/2006    Past Surgical History:  Procedure Laterality Date   BREAST LUMPECTOMY Left fall 1997   Lymph nodes X 3 were negative, ER+, Radiation treatmetn.  Took Tamoxifem X 5 yrs then Evista for 3-5 yrs.   CESAREAN SECTION     x2  first pregnancy breach and second normal   PELVIC FRACTURE SURGERY     endometriosis, remotely     OB History    Gravida  2     Para  2   Term  2   Preterm  0   AB  0   Living  2     SAB  0   TAB  0   Ectopic  0   Multiple  0   Live Births  2            Home Medications    Prior to Admission medications   Medication Sig Start Date End Date Taking? Authorizing Provider  ALPRAZolam (XANAX) 0.25 MG tablet TAKE ONE TO TWO TABLETS BY MOUTH TWICE DAILY AS NEEDED FOR ANXIETY 04/22/18   Colon Branch, MD  calcium-vitamin D (OSCAL 500/200 D-3) 500-200 MG-UNIT per tablet Take 1 tablet by mouth daily.      [provider]  escitalopram (LEXAPRO) 20 MG tablet Take 1 tablet (20 mg total) by mouth daily. 01/29/18   Colon Branch, MD  metoprolol tartrate (LOPRESSOR) 50 MG tablet Take 1 tablet (50 mg total) by mouth 2 (two) times daily. 01/13/18   Colon Branch, MD  Multiple Vitamins-Minerals (CENTRUM SILVER PO) Take 1 each by mouth daily.      [provider]  OLANZapine (ZYPREXA) 5 MG tablet TAKE ONE TABLET BY MOUTH TWO TO THREE HOURS PRIOR TO BEDTIME 05/11/18   Cottle, Billey Co., MD  ondansetron (ZOFRAN ODT) 4 MG disintegrating tablet 4mg  ODT q4 hours prn nausea/vomit 05/12/18   Deno Etienne, DO  pantoprazole (PROTONIX) 40 MG tablet Take 1 tablet (40 mg total) by mouth daily. 01/30/18   Colon Branch, MD    Family History Family History  Problem Relation Age of Onset   Hypertension Mother    Stroke Mother    Heart attack Father        elderly   Heart disease Father        CHF   Throat cancer Other    Breast cancer Neg Hx    Colon cancer Neg Hx    Diabetes Neg Hx     Social History Social History   Tobacco Use   Smoking status: Former Smoker   Smokeless tobacco: Never Used   Tobacco comment: Quit >30 yrs ago  Substance Use Topics   Alcohol use: Yes    Comment: 2 glass wine dialy   Drug use: No     Allergies   Penicillins   Review of Systems Review of Systems  Constitutional: Negative for chills and fever.  HENT: Negative for congestion and rhinorrhea.   Eyes:  Negative for redness and visual disturbance.  Respiratory: Positive for cough and shortness of breath. Negative for wheezing.   Cardiovascular: Negative for chest pain and palpitations.  Gastrointestinal: Positive for abdominal pain, diarrhea, nausea and vomiting.  Genitourinary: Negative for dysuria and urgency.  Musculoskeletal:  Negative for arthralgias and myalgias.  Skin: Negative for pallor and wound.  Neurological: Positive for weakness. Negative for dizziness and headaches.     Physical Exam Updated Vital Signs BP 126/80 (BP Location: Left Arm)    Pulse 78    Temp 98.3 F (36.8 C) (Oral)    Resp 18    Ht 5\' 2"  (1.575 m)    Wt 68 kg    LMP 01/22/1992 (Within Months)    SpO2 98%    BMI 27.44 kg/m   Physical Exam Vitals signs and nursing note reviewed.  Constitutional:      General: She is not in acute distress.    Appearance: She is well-developed. She is not diaphoretic.  HENT:     Head: Normocephalic and atraumatic.  Eyes:     Pupils: Pupils are equal, round, and reactive to light.  Neck:     Musculoskeletal: Normal range of motion and neck supple.  Cardiovascular:     Rate and Rhythm: Normal rate and regular rhythm.     Heart sounds: No murmur. No friction rub. No gallop.   Pulmonary:     Effort: Pulmonary effort is normal.     Breath sounds: No wheezing or rales.  Abdominal:     General: There is no distension.     Palpations: Abdomen is soft.     Tenderness: There is no abdominal tenderness.  Musculoskeletal:        General: No tenderness.  Skin:    General: Skin is warm and dry.  Neurological:     Mental Status: She is alert and oriented to person, place, and time.  Psychiatric:        Behavior: Behavior normal.      ED Treatments / Results  Labs (all labs ordered are listed, but only abnormal results are displayed) Labs Reviewed  CBC WITH DIFFERENTIAL/PLATELET - Abnormal; Notable for the following components:      Result Value   WBC 14.8 (*)    RBC  3.66 (*)    MCV 100.5 (*)    Neutro Abs 9.2 (*)    Monocytes Absolute 2.2 (*)    Basophils Absolute 0.2 (*)    Abs Immature Granulocytes 0.20 (*)    All other components within normal limits  COMPREHENSIVE METABOLIC PANEL - Abnormal; Notable for the following components:   Sodium 132 (*)    Glucose, Bld 118 (*)    Calcium 8.6 (*)    Total Protein 6.4 (*)    Albumin 2.8 (*)    AST 78 (*)    Alkaline Phosphatase 148 (*)    Total Bilirubin 1.9 (*)    All other components within normal limits  ETHANOL - Abnormal; Notable for the following components:   Alcohol, Ethyl (B) 46 (*)    All other components within normal limits  POCT I-STAT 7, (LYTES, BLD GAS, ICA,H+H) - Abnormal; Notable for the following components:   pO2, Arterial 78.0 (*)    Acid-base deficit 3.0 (*)    HCT 32.0 (*)    Hemoglobin 10.9 (*)    All other components within normal limits  NOVEL CORONAVIRUS, NAA (HOSPITAL ORDER, SEND-OUT TO REF LAB)  TROPONIN I  AMMONIA  MAGNESIUM  URINALYSIS, ROUTINE W REFLEX MICROSCOPIC    EKG None  Radiology Dg Chest Portable 1 View  Result Date: 05/12/2018 CLINICAL DATA:  Shortness of breath, fever, and cough for the past month. EXAM: PORTABLE CHEST 1 VIEW COMPARISON:  Chest x-ray dated April 03, 2015. FINDINGS: The heart size and mediastinal contours are within normal limits. Atherosclerotic calcification of the aortic arch. Normal pulmonary vascularity. No focal consolidation, pleural effusion, or pneumothorax. No acute osseous abnormality. Unchanged surgical clips in the left axilla. IMPRESSION: No active disease. Electronically Signed   By: Titus Dubin M.D.   On: 05/12/2018 13:29    Procedures Procedures (including critical care time)  Medications Ordered in ED Medications  sodium chloride 0.9 % bolus 1,000 mL (1,000 mLs Intravenous New Bag/Given 05/12/18 1346)  iohexol (OMNIPAQUE) 300 MG/ML solution 100 mL (100 mLs Intravenous Contrast Given 05/12/18 1446)      Initial Impression / Assessment and Plan / ED Course  I have reviewed the triage vital signs and the nursing notes.  Pertinent labs & imaging results that were available during my care of the patient were reviewed by me and considered in my medical decision making (see chart for details).        78 yo F with it least 2 weeks of cough diarrhea and vomiting.  There was concern that she may have COVID-19 and so she was sent here by the PCP for evaluation.  Patient's blood pressure is a bit lower than it typically is, she clinically appears to be dehydrated her initial oxygen saturation was 91% on room air though by my examination she is at 98% and talking complete sentences without difficulty.  She has no tachypnea her breath sounds are normal.  Chest x-ray viewed by me without focal infiltrate or pneumothorax.  Patient has a known issue with alcoholism and she also has been recently seen for depression and has had an increasing dose of olanzapine.  She has had decreased oral intake with her depression.  We will give a bolus of IV fluids.  Lab work with some mild LFT elevation which could be due to her alcohol intake though with her sporadic abdominal pain and nausea vomiting and diarrhea will obtain a CT of the abdomen pelvis with contrast.  Bone was negative.  EKG without concerning finding. ABG without hypercarbia.   Patient's blood pressures improved with IV fluids.  Patient is feeling better.  I will obtain a CT scan with her LFT elevation.  Patient would like to go home.  If her scan is without significant pathology and she is able to ambulate then would feel safe discharging her.   Of note the PCP is concerned the patient may have the novel coronavirus.  They have sent her here for evaluation.  I feel it be reasonable to send an outpatient test to clarify this so they can risk stratify if she can be seen back in the office.  Signed out to Dr. Wilson Singer, please see his note for further details  of care.   Nautica Hotz was evaluated in Emergency Department on 05/12/2018 for the symptoms described in the history of present illness. He/she was evaluated in the context of the global COVID-19 pandemic, which necessitated consideration that the patient might be at risk for infection with the SARS-CoV-2 virus that causes COVID-19. Institutional protocols and algorithms that pertain to the evaluation of patients at risk for COVID-19 are in a state of rapid change based on information released by regulatory bodies including the CDC and federal and state organizations. These policies and algorithms were followed during the patient's care in the ED.   The patients results and plan were reviewed and discussed.   Any x-rays performed were independently reviewed by myself.   Differential diagnosis were  considered with the presenting HPI.  Medications  sodium chloride 0.9 % bolus 1,000 mL (1,000 mLs Intravenous New Bag/Given 05/12/18 1346)  iohexol (OMNIPAQUE) 300 MG/ML solution 100 mL (100 mLs Intravenous Contrast Given 05/12/18 1446)    Vitals:   05/12/18 1350 05/12/18 1350 05/12/18 1432 05/12/18 1510  BP: (!) 93/58  126/80   Pulse: 81  80 78  Resp:   19 18  Temp:  98.5 F (36.9 C) 98.3 F (36.8 C)   TempSrc:   Oral   SpO2: 91%  99% 98%  Weight:      Height:        Final diagnoses:  Diarrhea, unspecified type  Generalized weakness     Final Clinical Impressions(s) / ED Diagnoses   Final diagnoses:  Diarrhea, unspecified type  Generalized weakness    ED Discharge Orders         Ordered    ondansetron (ZOFRAN ODT) 4 MG disintegrating tablet     05/12/18 [place], Nikeisha Klutz, DO 05/12/18 1513

## 2018-05-12 NOTE — Telephone Encounter (Signed)
Addendum, spoke with patient to let her know about coming to the office today, although she told me she has not have any recent fever, she has diarrhea moderate cough for the last few days. We wpn't be able to see her at the office d/t Covid Sxs I asked her to go to the ER today, her symptoms are worrisome.  Needs face-to-face evaluation, labs etc.

## 2018-05-13 NOTE — Progress Notes (Signed)
Office visit cancel  due to symptoms suspicious for covid

## 2018-05-14 ENCOUNTER — Other Ambulatory Visit: Payer: Self-pay

## 2018-05-14 ENCOUNTER — Ambulatory Visit (INDEPENDENT_AMBULATORY_CARE_PROVIDER_SITE_OTHER): Payer: Medicare Other | Admitting: Internal Medicine

## 2018-05-14 ENCOUNTER — Encounter: Payer: Self-pay | Admitting: Internal Medicine

## 2018-05-14 DIAGNOSIS — R197 Diarrhea, unspecified: Secondary | ICD-10-CM | POA: Diagnosis not present

## 2018-05-14 DIAGNOSIS — F419 Anxiety disorder, unspecified: Secondary | ICD-10-CM | POA: Diagnosis not present

## 2018-05-14 DIAGNOSIS — F1010 Alcohol abuse, uncomplicated: Secondary | ICD-10-CM

## 2018-05-14 DIAGNOSIS — R945 Abnormal results of liver function studies: Secondary | ICD-10-CM

## 2018-05-14 DIAGNOSIS — R7989 Other specified abnormal findings of blood chemistry: Secondary | ICD-10-CM

## 2018-05-14 DIAGNOSIS — F329 Major depressive disorder, single episode, unspecified: Secondary | ICD-10-CM

## 2018-05-14 NOTE — Telephone Encounter (Signed)
:   Routine Created on: 05/13/2018 04:08 PM By: Sheran Luz  Primary Information   Source Subject Topic  Monique Ballard (Patient) Monique Ballard (Patient) Appointment Scheduling - Scheduling Inquiry for Clinic  Reason for CRM: Patient's daughter [name] calling to schedule ED follow up visit for mom. She is requesting a call back at [phone]

## 2018-05-14 NOTE — Telephone Encounter (Signed)
Dr Larose Kells -- pt's daughter requests you to call her at the number below to discuss ER results with her as her mom is self-quarantined and she will not be able to be with her at the Virtual visit today. Spoke with pt's daughter and scheduled ER follow up for today at 2pm with Dr Larose Kells. Notified pt of appt time and obtained verbal consent to discuss health information with her daughter if PCP is able to do so.

## 2018-05-14 NOTE — Progress Notes (Signed)
Subjective:    Patient ID: Monique Ballard, female    DOB: [birth year], 78 y.o.   MRN: [id number]  DOS:  05/14/2018 Type of visit - description: Virtual Visit via Video Note  I connected with@ on 05/15/18 at  2:00 PM EDT by a video enabled telemedicine application and verified that I am speaking with the correct person using two identifiers.   THIS ENCOUNTER IS A VIRTUAL VISIT DUE TO COVID-19 - PATIENT WAS NOT SEEN IN THE OFFICE. PATIENT HAS CONSENTED TO VIRTUAL VISIT / TELEMEDICINE VISIT   Location of patient: home  Location of provider: office  I discussed the limitations of evaluation and management by telemedicine and the availability of in person appointments. The patient expressed understanding and agreed to proceed.  History of Present Illness: ER follow-up Was seen in the emergency room 05/12/2018 with multiple symptoms. Records reviewed: Chest x-ray negative, troponin negative, white blood cell 14.8, hemoglobin 12.1, slightly low.  Sodium 132, AST 78, higher than before.  Alkaline phosphate 128. Ammonia normal, ethanol level + at 46.  CT abdomen severe hepatic steatosis, otherwise no acute.  COVID coronavirus test pending.   Review of Systems Since she left the ER, she continue with watery diarrhea typically after she eats. No blood in the stools. No fever or chills. She continue with some cough. No nausea, vomiting or abdominal cramps. We talk about alcohol, she admits to ongoing alcohol intake. Mood has improved, denies suicidal ideas  Past Medical History:  Diagnosis Date  . Breast cancer ([place]) fall 1997    Left Lumpectomy, XRT.  treated with Tamoxifem and Evista  . Depression   . Endometriosis in her 20's   surgery to help clear endometriosis to obtain a pregnancy.  Marland Kitchen GERD (gastroesophageal reflux disease)    w/"stretching" remotely  . HTN (hypertension)     Past Surgical History:  Procedure Laterality Date  . BREAST LUMPECTOMY Left fall 1997   Lymph  nodes X 3 were negative, ER+, Radiation treatmetn.  Took Tamoxifem X 5 yrs then Evista for 3-5 yrs.  . CESAREAN SECTION     x2  first pregnancy breach and second normal  . PELVIC FRACTURE SURGERY     endometriosis, remotely    Social History   Socioeconomic History  . Marital status: Married    Spouse name: Not on file  . Number of children: 2  . Years of education: Not on file  . Highest education level: Not on file  Occupational History  . Occupation: retired  Scientific laboratory technician  . Financial resource strain: Not on file  . Food insecurity:    Worry: Not on file    Inability: Not on file  . Transportation needs:    Medical: Not on file    Non-medical: Not on file  Tobacco Use  . Smoking status: Former Research scientist (life sciences)  . Smokeless tobacco: Never Used  . Tobacco comment: Quit >30 yrs ago  Substance and Sexual Activity  . Alcohol use: Yes    Comment: 2 glass wine dialy  . Drug use: No  . Sexual activity: Never    Birth control/protection: None  Lifestyle  . Physical activity:    Days per week: Not on file    Minutes per session: Not on file  . Stress: Not on file  Relationships  . Social connections:    Talks on phone: Not on file    Gets together: Not on file    Attends religious service: Not on file  Active member of club or organization: Not on file    Attends meetings of clubs or organizations: Not on file    Relationship status: Not on file  . Intimate partner violence:    Fear of current or ex partner: Not on file    Emotionally abused: Not on file    Physically abused: Not on file    Forced sexual activity: Not on file  Other Topics Concern  . 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      Medication List       Accurate as of May 14, 2018 11:59 PM. Always use your most recent med list.        ALPRAZolam 0.25 MG tablet Commonly known as:  XANAX TAKE ONE TO TWO TABLETS BY MOUTH TWICE DAILY AS NEEDED FOR ANXIETY   CENTRUM SILVER PO Take 1 each by mouth daily.   escitalopram 20 MG tablet Commonly known as:  LEXAPRO Take 1 tablet (20 mg total) by mouth daily.   metoprolol tartrate 50 MG tablet Commonly known as:  LOPRESSOR Take 1 tablet (50 mg total) by mouth 2 (two) times daily.   OLANZapine 5 MG tablet Commonly known as:  ZYPREXA TAKE ONE TABLET BY MOUTH TWO TO THREE HOURS PRIOR TO BEDTIME   ondansetron 4 MG disintegrating tablet Commonly known as:  Zofran ODT 4mg  ODT q4 hours prn nausea/vomit   Oscal 500/200 D-3 500-200 MG-UNIT tablet Generic drug:  calcium-vitamin D Take 1 tablet by mouth daily.   pantoprazole 40 MG tablet Commonly known as:  PROTONIX Take 1 tablet (40 mg total) by mouth daily.           Objective:   Physical Exam LMP 01/22/1992 (Within Months)  This is a virtual video visit.  Alert oriented x3.    Assessment      Assessment HTN Depression, Anxiety Tremor dx 11-2014 GERD with esophageal stricture L Breast cancer, 1997, lumpectomy, XRT, released from oncology  PLAN: Seen at the ER with multiple symptoms.  See my phone note from 05/12/2018.  Work-up reviewed. Depression, anxiety: Under the care of Dr. Clovis Pu, overall mood is better.  No suicidal ideas Weight  loss: Documented 10 pound weight loss per patient scales and our scales.  See next Diarrhea: On and off for few weeks, work-up at the ER showed slight increased LFTs, CT abdomen benign except for hepatic steatosis.  We will get a stool sample for WBCs, GI pathogens and Hemoccult.  Check a TSH.  Consider referral GI soon. Abnormal LFTs: Probably a combination of fatty liver and EtOH.  Recheck in few months. Alcohol abuse The patient reports that she has been drinking alcohol for  more than 10 years, daily, never diagnosed with alcohol abuse but she has think before she has a problem.  I told patient I agree that she has a problem, she plans to see her counselor next week and talk to them.  We will check a Q94 and folic ; recommend also AA. I do not think she is ready to completely quit, I offered her medication (Librium?) fr when/if she decides to stop drinking With the patient's permission, I spoke with [name], her daughter and discussed all the above.  She was appreciative.  She plans to visit [place] tomorrow. Next assessment 1 month, virtual or face-to-face preferred.   Today, I spent more than 40   min with the patient: >50% of the time counseling regards EtOH, reviewing the chart, coordinating her care, discussing plan of care with her family.

## 2018-05-15 DIAGNOSIS — F1020 Alcohol dependence, uncomplicated: Secondary | ICD-10-CM | POA: Insufficient documentation

## 2018-05-15 NOTE — Assessment & Plan Note (Signed)
Seen at the ER with multiple symptoms.  See my phone note from 05/12/2018.  Work-up reviewed. Depression, anxiety: Under the care of Dr. Clovis Pu, overall mood is better.  No suicidal ideas Weight loss: Documented 10 pound weight loss per patient scales and our scales.  See next Diarrhea: On and off for few weeks, work-up at the ER showed slight increased LFTs, CT abdomen benign except for hepatic steatosis.  We will get a stool sample for WBCs, GI pathogens and Hemoccult.  Check a TSH.  Consider referral GI soon. Abnormal LFTs: Probably a combination of fatty liver and EtOH.  Recheck in few months. Alcohol abuse The patient reports that she has been drinking alcohol for more than 10 years, daily, never diagnosed with alcohol abuse but she has think before she has a problem.  I told patient I agree that she has a problem, she plans to see her counselor next week and talk to them.  We will check a M42 and folic ; recommend also AA. I do not think she is ready to completely quit, I offered her medication (Librium?) fr when/if she decides to stop drinking With the patient's permission, I spoke with [name], her daughter and discussed all the above.  She was appreciative.  She plans to visit [place] tomorrow. Next assessment 1 month, virtual or face-to-face preferred.

## 2018-05-18 ENCOUNTER — Other Ambulatory Visit: Payer: Medicare Other

## 2018-05-18 ENCOUNTER — Ambulatory Visit (INDEPENDENT_AMBULATORY_CARE_PROVIDER_SITE_OTHER): Payer: Medicare Other | Admitting: Psychiatry

## 2018-05-18 ENCOUNTER — Other Ambulatory Visit: Payer: Self-pay

## 2018-05-18 DIAGNOSIS — F331 Major depressive disorder, recurrent, moderate: Secondary | ICD-10-CM

## 2018-05-18 NOTE — Progress Notes (Signed)
[facility] Counselor Initial Adult Exam  Name: Monique Ballard Date: 05/18/2018 MRN: [id number] DOB: 23-May-1940 PCP: Colon Branch, MD  Time spent:  60 minutes   3:00pm to 4:00pm  Virtual Visit Video Note Connected with patient by a video enabled telemedicine/telehealth application or telephone, with their informed consent, and verified patient privacy and that I am speaking with the correct person using two identifiers.I am at [place] and patient is at home.  I discussed the limitations, risks, security and privacy concerns of performing psychotherapy and management service by telephone and the availability of in person appointments. I also discussed with the patient that there may be a patient responsible charge related to this service. The patient expressed understanding and agreed to proceed. I discussed the treatment planning with the patient. The patient was provided an opportunity to ask questions and all were answered. The patient agreed with the plan and demonstrated an understanding of the instructions. The patient was advised to call  our office if  symptoms worsen or feel they are in a crisis state and need immediate contact.  Therapist Location: home Patient Location: home  Start time: 3:00pm Stop time: 4:00pm  Guardian/Payee:  patient  Paperwork requested:  No   Reason for Visit /Presenting Problem: depression, anxiety  Mental Status Exam:   Appearance:   Casual     Behavior:  Appropriate and Sharing  Motor:  Normal  Speech/Language:   Clear and Coherent  Affect:  anxious  Mood:  anxious  Thought process:  goal directed  Thought content:    WNL  Sensory/Perceptual disturbances:    WNL  Orientation:  oriented to person, place, time/date, situation, day of week, month of year and year  Attention:  Good  Concentration:  Good  Memory:  Recent;   patient reports that it's starting to slip a little  Fund of knowledge:   Good  Insight:    Good  Judgment:    Good  Impulse Control:  Good   Reported Symptoms:  Depression and anxiety  Risk Assessment: Danger to Self:  No Self-injurious Behavior: No Danger to Others: No Duty to Warn:no Physical Aggression / Violence:No  Access to Firearms a concern: No  Gang Involvement:No  Patient / guardian was educated about steps to take if suicide or homicide risk level increases between visits: PATIENT DENIES ANY SI / HI While future psychiatric events cannot be accurately predicted, the patient does not currently require acute inpatient psychiatric care and does not currently meet [facility] involuntary commitment criteria.  Substance Abuse History: Current substance abuse: used to drink a bit too much so I cut back to 1 small glass per night     Past Psychiatric History:   Has had prior treatment for depression and anxiety. Outpatient Providers: Oliver Pila, Clint Bolder  History of Psych Hospitalization: No  Psychological Testing: none   Abuse History: Victim of No., none   Report needed: No. Victim of Neglect:No. Perpetrator of none  Witness / Exposure to Domestic Violence: No   Protective Services Involvement: No  Witness to [employer] Violence:  No   Family History:  Family History  Problem Relation Age of Onset  . Hypertension Mother   . Stroke Mother   . Heart attack Father        elderly  . Heart disease Father        CHF  . Throat cancer Other   . Breast cancer Neg Hx   . Colon cancer Neg Hx   .  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    Legal History: Pending legal issue / charges: none. History of legal issue / charges: none  Medical History/Surgical History:REVIEWED WITH PATIENT AND SHE REPORTS SHE IS NOT TAKING ZOFRAN CURRENTLY AND DOES NOT RECALL A SURGERY RELATED TO A FRACTURED PELVIS.  05/18/2018 [place] Past Medical History:  Diagnosis Date  . Breast cancer ([place]) fall 1997    Left Lumpectomy, XRT.  treated with Tamoxifem and Evista  . Depression   . Endometriosis in her 20's   surgery to help clear endometriosis to obtain a pregnancy.  Marland Kitchen GERD (gastroesophageal reflux disease)    w/"stretching" remotely  . HTN (hypertension)     Past Surgical History:  Procedure Laterality Date  . BREAST LUMPECTOMY Left fall 1997   Lymph nodes X 3 were negative, ER+, Radiation treatmetn.  Took Tamoxifem X 5 yrs then Evista for 3-5 yrs.  . CESAREAN SECTION     x2  first pregnancy breach and second normal  . PELVIC FRACTURE SURGERY     endometriosis, remotely    Medications: Current Outpatient Medications  Medication Sig Dispense Refill  . ALPRAZolam (XANAX) 0.25 MG tablet TAKE ONE TO TWO TABLETS BY MOUTH TWICE DAILY AS NEEDED FOR ANXIETY 60 tablet 0  . calcium-vitamin D (OSCAL 500/200 D-3) 500-200 MG-UNIT per tablet Take 1 tablet by mouth daily.      Marland Kitchen escitalopram (LEXAPRO) 20 MG tablet Take 1 tablet (20 mg total) by  mouth daily. 90 tablet 1  . metoprolol tartrate (LOPRESSOR) 50 MG tablet Take 1 tablet (50 mg total) by mouth 2 (two) times daily. 180 tablet 1  . Multiple Vitamins-Minerals (CENTRUM SILVER PO) Take 1 each by mouth daily.      Marland Kitchen OLANZapine (ZYPREXA) 5 MG tablet TAKE ONE TABLET BY MOUTH TWO TO THREE HOURS PRIOR TO BEDTIME 30 tablet 0  . ondansetron (ZOFRAN ODT) 4 MG disintegrating tablet 4mg  ODT q4 hours prn nausea/vomit 20 tablet 0  . pantoprazole (PROTONIX) 40 MG tablet Take 1 tablet (40 mg total) by mouth daily. 90 tablet 3   No current facility-administered medications for this visit.     Allergies  Allergen Reactions  . Penicillins Rash    Has patient had a PCN reaction causing immediate rash, facial/tongue/throat swelling, SOB or lightheadedness with hypotension: Yes Has patient had a PCN reaction causing severe rash involving mucus membranes or skin necrosis: No Has patient had a PCN reaction that  required hospitalization No Has patient had a PCN reaction occurring within the last 10 years: No If all of the above answers are "NO", then may proceed with Cephalosporin use.     Diagnoses:    ICD-10-CM   1. Major depressive disorder, recurrent episode, moderate ([place]) F33.1     Plan of Care:  Patient is a 78 yr old, married 47 yrs, lives with husband and a 75 yr old son.  Also has a daughter, separated, age 2 with 2 sons, living in [place], [place].  Main symptoms are depression and anxiety.  Patient states her main concerns right now are: Getting myself back to feeling normal.  (happier, not dwelling on negatives) Getting my son out of the house so we can get our lives back. Improve self esteem. (Began to slip downward after retirement. Worked in school                                       office)    Shanon Ace, LCSW

## 2018-05-25 ENCOUNTER — Encounter: Payer: Self-pay | Admitting: Internal Medicine

## 2018-05-26 ENCOUNTER — Other Ambulatory Visit: Payer: Medicare Other

## 2018-05-26 ENCOUNTER — Other Ambulatory Visit: Payer: Self-pay

## 2018-05-27 ENCOUNTER — Other Ambulatory Visit (INDEPENDENT_AMBULATORY_CARE_PROVIDER_SITE_OTHER): Payer: Medicare Other

## 2018-05-27 ENCOUNTER — Other Ambulatory Visit: Payer: Medicare Other

## 2018-05-27 ENCOUNTER — Other Ambulatory Visit: Payer: Self-pay

## 2018-05-27 DIAGNOSIS — R197 Diarrhea, unspecified: Secondary | ICD-10-CM

## 2018-05-27 LAB — FECAL OCCULT BLOOD, IMMUNOCHEMICAL: Fecal Occult Bld: NEGATIVE

## 2018-05-29 ENCOUNTER — Other Ambulatory Visit: Payer: Self-pay

## 2018-05-29 ENCOUNTER — Ambulatory Visit (INDEPENDENT_AMBULATORY_CARE_PROVIDER_SITE_OTHER): Payer: Medicare Other | Admitting: Psychiatry

## 2018-05-29 DIAGNOSIS — F331 Major depressive disorder, recurrent, moderate: Secondary | ICD-10-CM

## 2018-05-29 LAB — GASTROINTESTINAL PATHOGEN PANEL PCR
C. difficile Tox A/B, PCR: NOT DETECTED
Campylobacter, PCR: NOT DETECTED
Cryptosporidium, PCR: NOT DETECTED
E coli (ETEC) LT/ST PCR: NOT DETECTED
E coli (STEC) stx1/stx2, PCR: NOT DETECTED
E coli 0157, PCR: NOT DETECTED
Giardia lamblia, PCR: NOT DETECTED
Norovirus, PCR: NOT DETECTED
Rotavirus A, PCR: NOT DETECTED
Salmonella, PCR: NOT DETECTED
Shigella, PCR: NOT DETECTED

## 2018-05-29 LAB — FECAL LACTOFERRIN, QUANT
Fecal Lactoferrin: NEGATIVE
MICRO NUMBER:: 450255
SPECIMEN QUALITY:: ADEQUATE

## 2018-05-29 NOTE — Progress Notes (Signed)
[facility] Counsel[facility] Initial Adult Exam  Name: Monique Ballard Date: 05/29/2018 MRN: [id number] DOB: 03-26-1940 PCP: Colon Branch, MD  Time spent:  60 minutes   11:00am to 12:00noon  Virtual Visit Video Note Connected with patient by a video enabled telemedicine/telehealth application or telephone, with their informed consent, and verified patient privacy and that I am speaking with the correct person using two identifiers.I am at [place] and patient is at home.  I discussed the limitations, risks, security and privacy concerns of performing psychotherapy and management service by telephone and the availability of in person appointments. I also discussed with the patient that there may be a patient responsible charge related to this service. The patient expressed understanding and agreed to proceed. I discussed the treatment planning with the patient. The patient was provided an opportunity to ask questions and all were answered. The patient agreed with the plan and demonstrated an understanding of the instructions. The patient was advised to call  our office if  symptoms worsen or feel they are in a crisis state and need immediate contact.  Therapist Location: [facility] Patient Location: home  Start time: 11:00am Stop time: 12:00noon   Symptoms:  depression, anxiety  Mental Status Exam:   Appearance:   N/A    telehealth  Behavior:  Appropriate and Sharing  Motor:  Normal  Speech/Language:   Clear and Coherent  Affect:  N/A   telehealth  Mood:  anxious  Thought process:  goal directed  Thought content:    WNL  Sensory/Perceptual disturbances:    WNL  Orientation:  oriented to person, place, time/date, situation, day of week, month of year and year  Attention:  Good  Concentration:  Good  Memory:  Recent;   patient reports that it's starting to slip a little  Fund of knowledge:   Good  Insight:    Good  Judgment:   Good  Impulse Control:  Good     Risk Assessment: Danger to Self:  No Self-injurious Behavior: No Danger to Others: No Duty to Warn:no Physical Aggression / Violence:No  Access to Firearms a concern: No  Gang Involvement:No  Patient / guardian was educated about steps to take if suicide or homicide risk level increases between visits: PATIENT DENIES ANY SI / HI While future psychiatric events cannot be accurately predicted, the patient does not currently require acute inpatient psychiatric care and does not currently meet [facility] involuntary commitment criteria.  Subjective: Patient reports continued anxiety and depression.  Has worked on letting go of some guilt as mentioned in prior appt.  Has also tried to do at least 1 task a day and not be so self-judging. Likes the feeling "I've accomplished something."  Discussed some marital communication concerns as well issues of adult son still living with patient and husband. Talked in detail and freely expressed her feelings about son's situation and what she feels needs to happen.  Looked at strategies that can help her manage her anxiety and depression, and to follow up with her son about the need to him to have a plan on moving back into a place of his own so patient and husband can have their home without anyone else living there with them.  She doesn't want to be hurtful to son but feels that he is not even thinking about getting his own place.  Parents originally thought his stay with them was temporary until he found a place of his own.  Goal review and upcoming  action step discussed.   Diagnoses:    ICD-10-CM   1. Major depressive disorder, recurrent episode, moderate ([place]) F33.1     Plan of Care:  Patient to follow through on strategies discussed today especially in relation to husband and to their adult son.  Her goals in addition to those on Flowsheet, include: Getting myself back to feeling normal.  (happier, not dwelling on negatives) Getting my son  out of the house so we can get our lives back. Improve self esteem. 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

## 2018-05-31 ENCOUNTER — Other Ambulatory Visit: Payer: Self-pay | Admitting: Internal Medicine

## 2018-06-01 NOTE — Telephone Encounter (Signed)
Alprazolam refill.   Last OV: 05/14/2018 Last Fill: 04/22/2018 #60 and 0RF UDS: 04/21/2017 Low risk

## 2018-06-01 NOTE — Telephone Encounter (Signed)
I spoke with the patient today, made her aware that the stools test are negative.  She continues with mild on and off diarrhea without fever, chills, blood in the stools or further weight loss. We talk about possibly GI referral but she prefers to wait. Plan:  Sherri, please schedule face-to-face visit in 4 to 6 weeks RAF Xanax as requested.

## 2018-06-01 NOTE — Telephone Encounter (Signed)
Appointment  Scheduled.  07/14/18

## 2018-06-04 ENCOUNTER — Encounter: Payer: Self-pay | Admitting: Internal Medicine

## 2018-06-04 DIAGNOSIS — R197 Diarrhea, unspecified: Secondary | ICD-10-CM

## 2018-06-09 DIAGNOSIS — S0101XA Laceration without foreign body of scalp, initial encounter: Secondary | ICD-10-CM | POA: Diagnosis not present

## 2018-06-09 DIAGNOSIS — S0003XA Contusion of scalp, initial encounter: Secondary | ICD-10-CM | POA: Diagnosis not present

## 2018-06-10 ENCOUNTER — Emergency Department (HOSPITAL_COMMUNITY): Payer: Medicare Other

## 2018-06-10 ENCOUNTER — Emergency Department (HOSPITAL_COMMUNITY)
Admission: EM | Admit: 2018-06-10 | Discharge: 2018-06-10 | Disposition: A | Discharge status: Home / Self Care | Payer: Medicare Other | Attending: Emergency Medicine | Admitting: Emergency Medicine

## 2018-06-10 ENCOUNTER — Encounter (HOSPITAL_COMMUNITY): Payer: Self-pay | Admitting: Emergency Medicine

## 2018-06-10 ENCOUNTER — Encounter: Payer: Self-pay | Admitting: Internal Medicine

## 2018-06-10 ENCOUNTER — Other Ambulatory Visit: Payer: Self-pay

## 2018-06-10 DIAGNOSIS — Z23 Encounter for immunization: Secondary | ICD-10-CM | POA: Insufficient documentation

## 2018-06-10 DIAGNOSIS — W010XXA Fall on same level from slipping, tripping and stumbling without subsequent striking against object, initial encounter: Secondary | ICD-10-CM | POA: Diagnosis not present

## 2018-06-10 DIAGNOSIS — Y9301 Activity, walking, marching and hiking: Secondary | ICD-10-CM | POA: Insufficient documentation

## 2018-06-10 DIAGNOSIS — S0101XA Laceration without foreign body of scalp, initial encounter: Secondary | ICD-10-CM

## 2018-06-10 DIAGNOSIS — W19XXXA Unspecified fall, initial encounter: Secondary | ICD-10-CM

## 2018-06-10 DIAGNOSIS — Z87891 Personal history of nicotine dependence: Secondary | ICD-10-CM | POA: Diagnosis not present

## 2018-06-10 DIAGNOSIS — Y929 Unspecified place or not applicable: Secondary | ICD-10-CM | POA: Insufficient documentation

## 2018-06-10 DIAGNOSIS — Z79899 Other long term (current) drug therapy: Secondary | ICD-10-CM | POA: Diagnosis not present

## 2018-06-10 DIAGNOSIS — Y999 Unspecified external cause status: Secondary | ICD-10-CM | POA: Diagnosis not present

## 2018-06-10 DIAGNOSIS — Y92009 Unspecified place in unspecified non-institutional (private) residence as the place of occurrence of the external cause: Secondary | ICD-10-CM

## 2018-06-10 DIAGNOSIS — S0990XA Unspecified injury of head, initial encounter: Secondary | ICD-10-CM | POA: Diagnosis present

## 2018-06-10 DIAGNOSIS — S0003XA Contusion of scalp, initial encounter: Secondary | ICD-10-CM | POA: Diagnosis not present

## 2018-06-10 DIAGNOSIS — I10 Essential (primary) hypertension: Secondary | ICD-10-CM | POA: Diagnosis not present

## 2018-06-10 IMAGING — CT CT HEAD WITHOUT CONTRAST
3 series · 16 of 47 positions shown, 19 images · non-contrast
Comparison: [DATE]

CLINICAL DATA: Fall with head trauma

EXAM:
CT HEAD WITHOUT CONTRAST
TECHNIQUE: Contiguous axial images were obtained from the base of the skull
through the vertex without intravenous contrast.

[Series 2: head wo · axial · 0.47mm/px · z∈[-75,+50]mm · 10 of 31 slices shown, 13 images]
[im 3/31  brain]
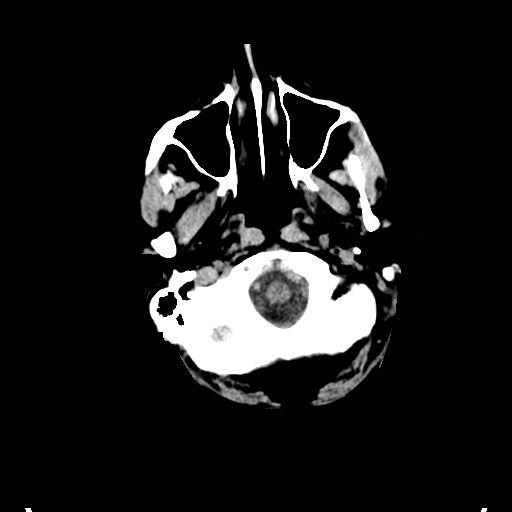
[im 3/31  bone]
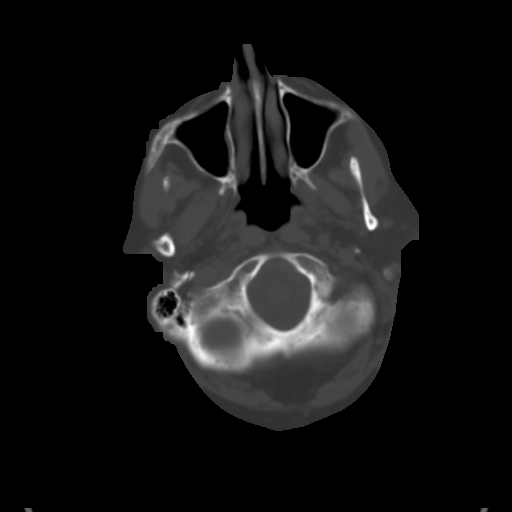
[im 6/31  brain]
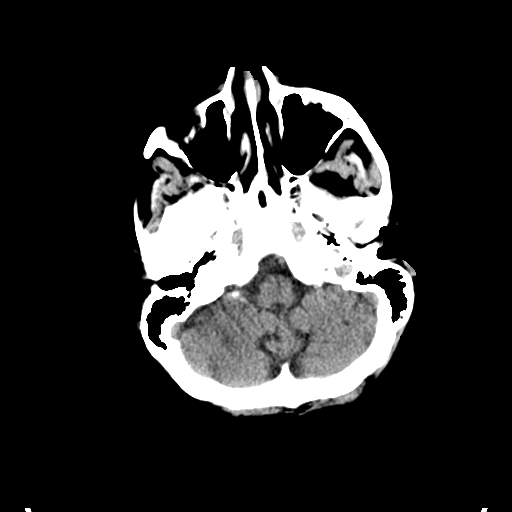
[im 9/31  brain]
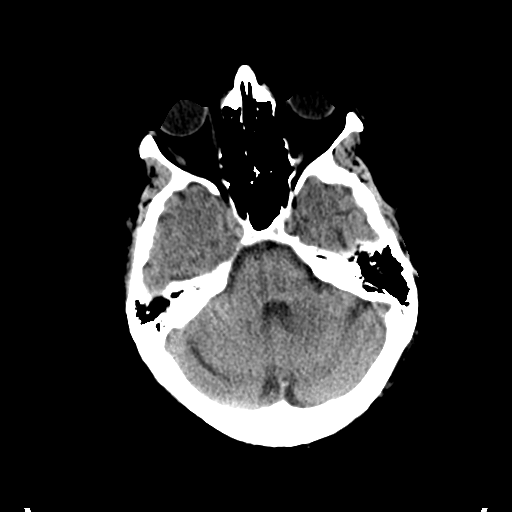
[im 11/31  brain]
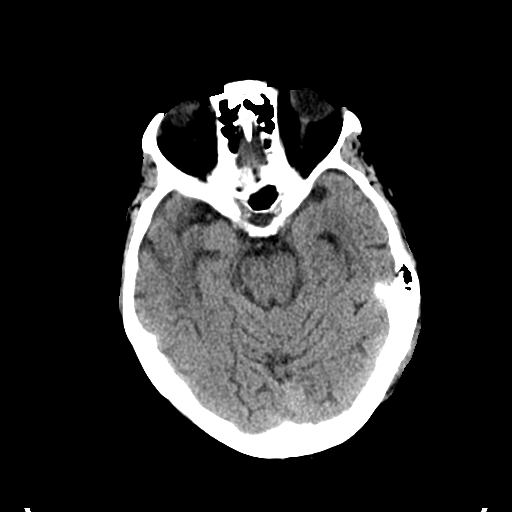
[im 14/31  brain]
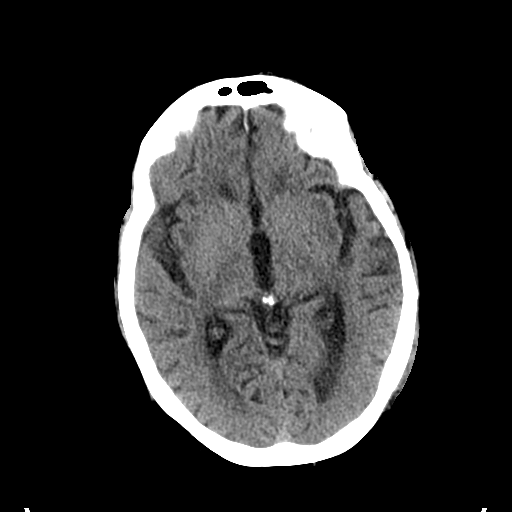
[im 14/31  bone]
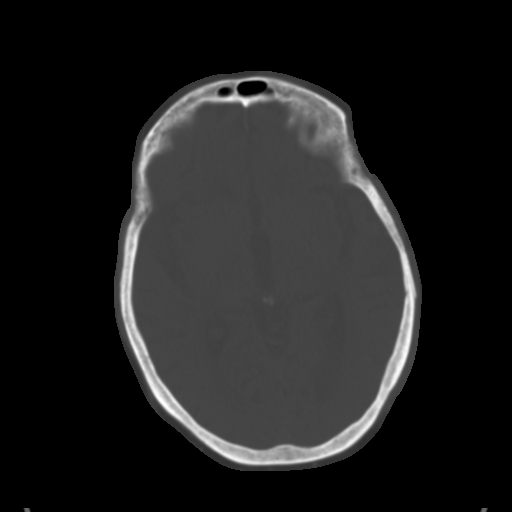
[im 17/31  brain]
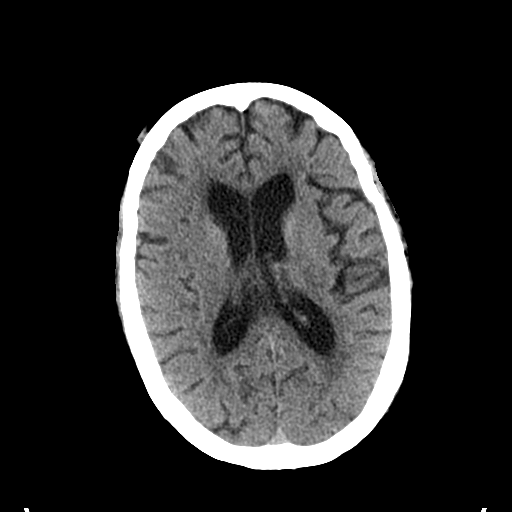
[im 20/31  brain]
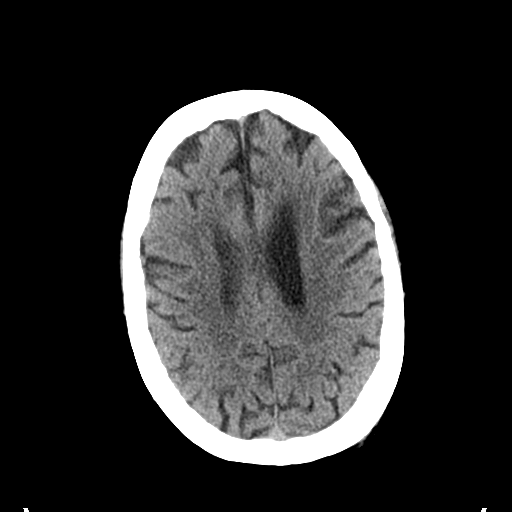
[im 23/31  brain]
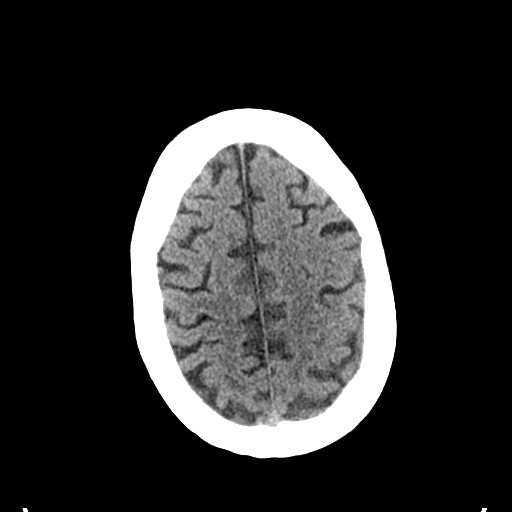
[im 25/31  brain]
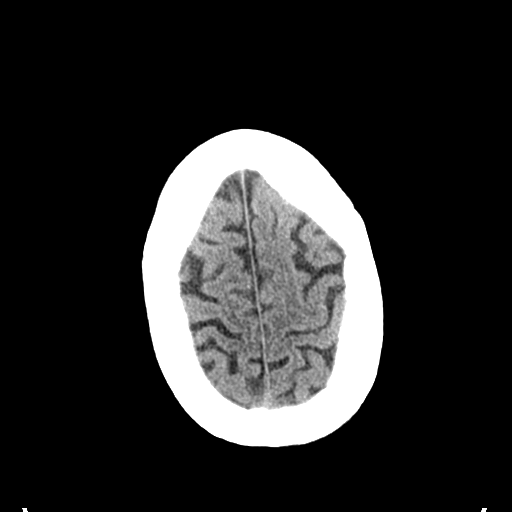
[im 25/31  bone]
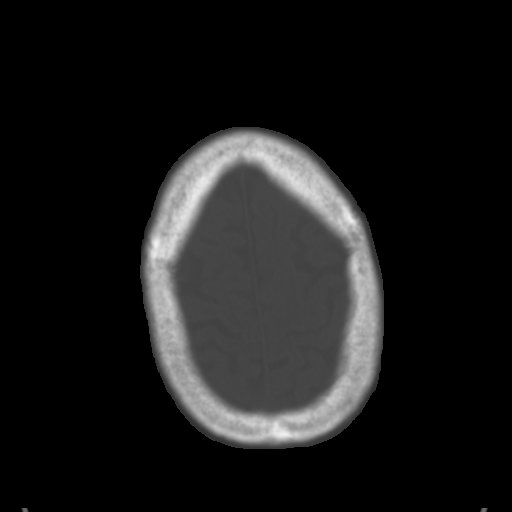
[im 28/31  brain]
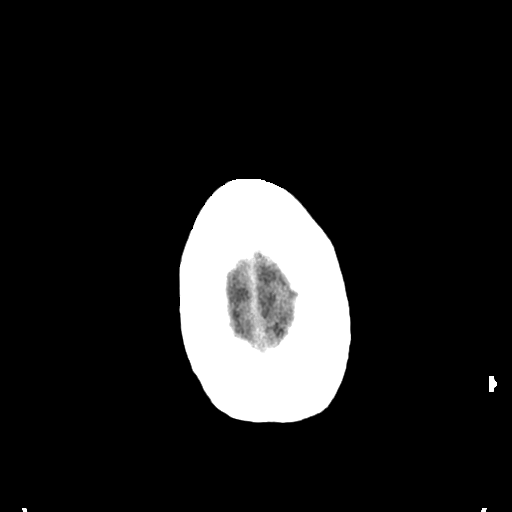

[Series 4: coronal soft tissue · coronal · 0.29mm/px · 3 of 63 slices shown]
[im 21/63  brain]
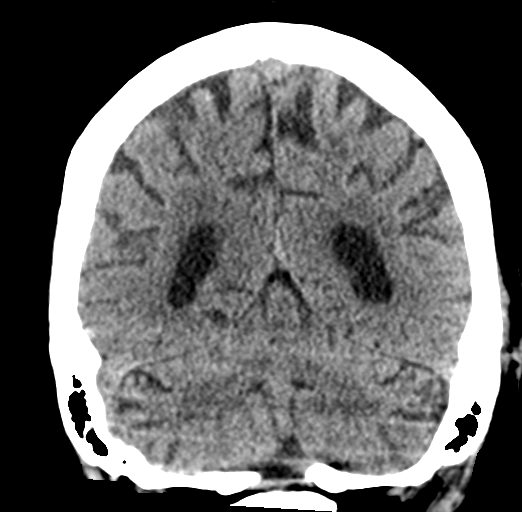
[im 28/63  brain]
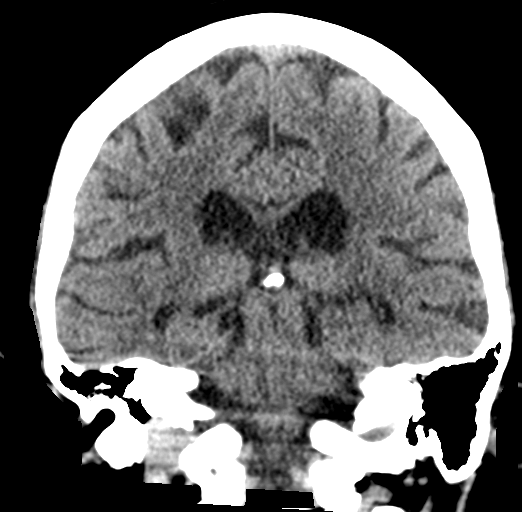
[im 35/63  brain]
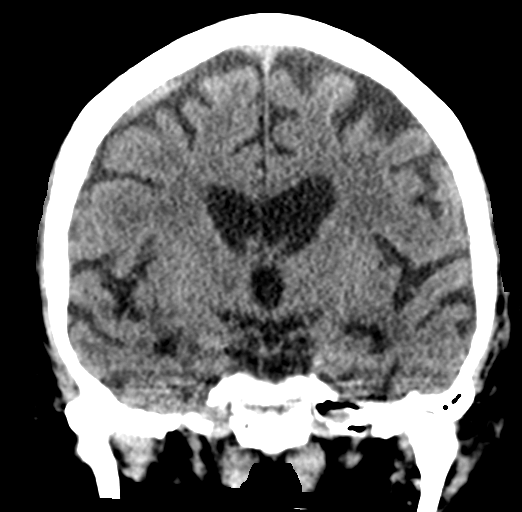

[Series 5: sagittal soft tissue · sagittal · 0.29mm/px · 3 of 48 slices shown]
[im 16/48  brain]
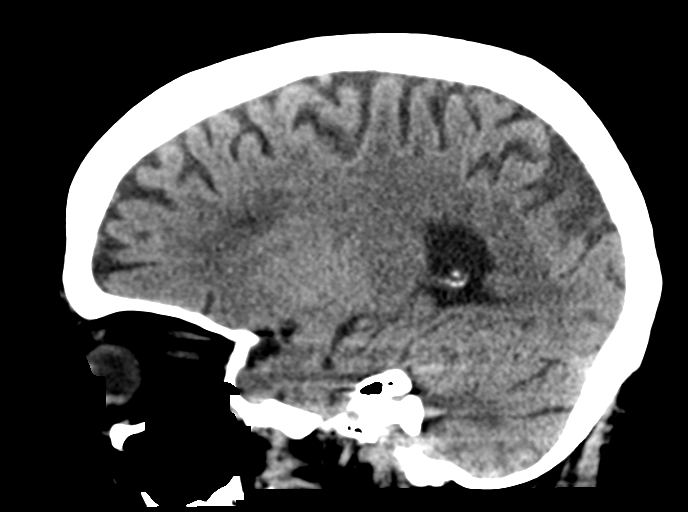
[im 24/48  brain]
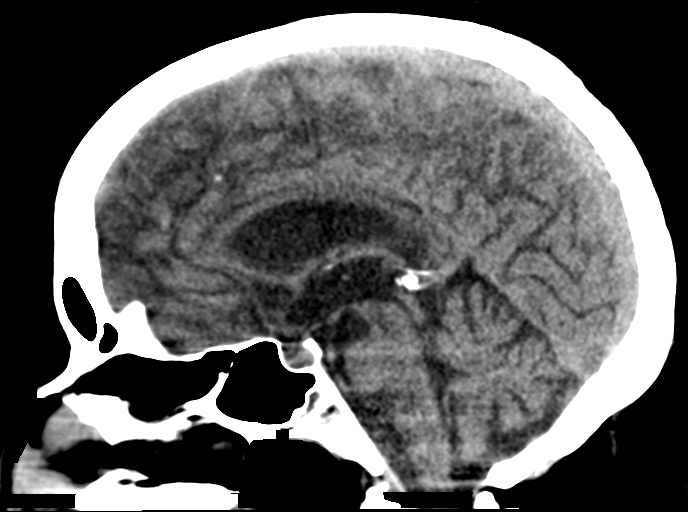
[im 32/48  brain]
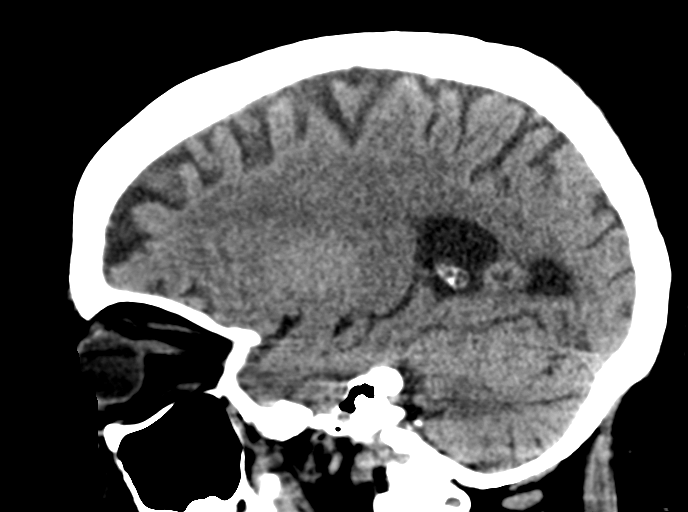

[16 of 47 positions shown; findings below may reference images not displayed]

FINDINGS: Brain: No evidence of acute infarction, hemorrhage, hydrocephalus,
extra-axial collection or mass lesion/mass effect. Mild cerebral
volume loss

Vascular: No hyperdense vessel or unexpected calcification.

Skull: Left posterior scalp hematoma and laceration. Negative for
calvarial fracture

Sinuses/Orbits: No evidence of injury
IMPRESSION: 1. Scalp hematoma without fracture.
2. No evidence of intracranial injury.

## 2018-06-10 MED ORDER — TETANUS-DIPHTH-ACELL PERTUSSIS 5-2.5-18.5 LF-MCG/0.5 IM SUSP
0.5000 mL | Freq: Once | INTRAMUSCULAR | Status: AC
  Administered 2018-06-10: 07:00:00 0.5 mL via INTRAMUSCULAR
  Filled 2018-06-10: qty 0.5

## 2018-06-10 MED ORDER — LIDOCAINE-EPINEPHRINE (PF) 2 %-1:200000 IJ SOLN
20.0000 mL | Freq: Once | INTRAMUSCULAR | Status: AC
  Administered 2018-06-10: 20 mL
  Filled 2018-06-10: qty 20

## 2018-06-10 NOTE — ED Notes (Signed)
Verbalized discharge instructions and follow up care. Alert and ambulatory. No IV. Son is picking her up

## 2018-06-10 NOTE — ED Provider Notes (Signed)
LACERATION REPAIR Performed by: Dewaine Oats Authorized by: Dewaine Oats Consent: Verbal consent obtained. Risks and benefits: risks, benefits and alternatives were discussed Consent given by: patient Patient identity confirmed: provided demographic data Prepped and Draped in normal sterile fashion Wound explored  Laceration Location: left postauricular scalp  Laceration Length: 3 cm  No Foreign Bodies seen or palpated  Anesthesia: local infiltration  Local anesthetic: lidocaine 1% w/epinephrine  Anesthetic total: 2 ml  Irrigation method: syringe Amount of cleaning: standard  Skin closure: staples  Number of sutures: 5  Technique: staples  Patient tolerance: Patient tolerated the procedure well with no immediate complications.    Charlann Lange, PA-C 06/10/18 [id number]    Shanon Rosser, MD 06/10/18 [phone]

## 2018-06-10 NOTE — ED Triage Notes (Signed)
Pt arriving POV from home with laceration on the back of her head. Pt reports she was getting out of bed, lost her balance, and fell backwards. Pt denies LOC or vision changes. Ambulatory upon arrival.

## 2018-06-10 NOTE — ED Provider Notes (Signed)
[facility] DEPT Provider Note: Georgena Spurling, MD, FACEP  CSN: [id number] MRN: [id number] ARRIVAL: 06/10/18 at 0539 ROOM: WA09/WA09   CHIEF COMPLAINT  Head Laceration   HISTORY OF PRESENT ILLNESS  06/10/18 5:55 AM Monique Ballard is a 78 y.o. female who got up to go to the bathroom just prior to arrival and lost her balance.  She fell backwards striking the back of her head.  She has a laceration to her left occiput.  She did not lose consciousness.  She has not been vomiting.  She rates the pain at the laceration site as a 4 out of 10.  She denies neck pain or other injury.   Past Medical History:  Diagnosis Date  . Breast cancer ([place]) fall 1997    Left Lumpectomy, XRT.  treated with Tamoxifem and Evista  . Depression   . Endometriosis in her 20's   surgery to help clear endometriosis to obtain a pregnancy.  Marland Kitchen GERD (gastroesophageal reflux disease)    w/"stretching" remotely  . HTN (hypertension)     Past Surgical History:  Procedure Laterality Date  . BREAST LUMPECTOMY Left fall 1997   Lymph nodes X 3 were negative, ER+, Radiation treatmetn.  Took Tamoxifem X 5 yrs then Evista for 3-5 yrs.  . CESAREAN SECTION     x2  first pregnancy breach and second normal  . PELVIC FRACTURE SURGERY     endometriosis, remotely    Family History  Problem Relation Age of Onset  . Hypertension Mother   . Stroke Mother   . Heart attack Father        elderly  . Heart disease Father        CHF  . Throat cancer Other   . Breast cancer Neg Hx   . Colon cancer Neg Hx   . Diabetes Neg Hx     Social History   Tobacco Use  . Smoking status: Former Research scientist (life sciences)  . Smokeless tobacco: Never Used  . Tobacco comment: Quit >30 yrs ago  Substance Use Topics  . Alcohol use: Yes    Comment: 2 glass wine dialy  . Drug use: No    Prior to Admission medications   Medication Sig Start Date End Date Taking? Authorizing Provider  ALPRAZolam (XANAX) 0.25 MG tablet Take 1-2 tablets (0.25-0.5 mg  total) by mouth 2 (two) times daily as needed for anxiety. 06/01/18   Colon Branch, MD  calcium-vitamin D (OSCAL 500/200 D-3) 500-200 MG-UNIT per tablet Take 1 tablet by mouth daily.      [provider]  escitalopram (LEXAPRO) 20 MG tablet Take 1 tablet (20 mg total) by mouth daily. 01/29/18   Colon Branch, MD  metoprolol tartrate (LOPRESSOR) 50 MG tablet Take 1 tablet (50 mg total) by mouth 2 (two) times daily. 01/13/18   Colon Branch, MD  Multiple Vitamins-Minerals (CENTRUM SILVER PO) Take 1 each by mouth daily.      [provider]  OLANZapine (ZYPREXA) 5 MG tablet TAKE ONE TABLET BY MOUTH TWO TO THREE HOURS PRIOR TO BEDTIME 05/11/18   Cottle, Billey Co., MD  ondansetron (ZOFRAN ODT) 4 MG disintegrating tablet 4mg  ODT q4 hours prn nausea/vomit 05/12/18   Deno Etienne, DO  pantoprazole (PROTONIX) 40 MG tablet Take 1 tablet (40 mg total) by mouth daily. 01/30/18   Colon Branch, MD    Allergies Penicillins   REVIEW OF SYSTEMS  Negative except as noted here or in the History of Present  Illness.   PHYSICAL EXAMINATION  Initial Vital Signs Blood pressure 121/75, pulse 85, temperature 98.5 F (36.9 C), temperature source Oral, resp. rate 12, last menstrual period 01/22/1992, SpO2 94 %.  Examination General: Well-developed, well-nourished female in no acute distress; appearance consistent with age of record HENT: normocephalic; laceration left occiput with underlying hematoma Eyes: pupils equal, round and reactive to light; extraocular muscles intact Neck: supple; nontender Heart: regular rate and rhythm Lungs: clear to auscultation bilaterally Abdomen: soft; nondistended; nontender; bowel sounds present Extremities: No deformity; full range of motion Neurologic: Awake, alert and oriented; motor function intact in all extremities and symmetric; no facial droop; normal coordination, speech and gait Skin: Warm and dry Psychiatric: Normal mood and affect   RESULTS  Summary of  this visit's results, reviewed by myself:   EKG Interpretation  Date/Time:    Ventricular Rate:    PR Interval:    QRS Duration:   QT Interval:    QTC Calculation:   R Axis:     Text Interpretation:        Laboratory Studies: No results found for this or any previous visit (from the past 24 hour(s)). Imaging Studies: Ct Head Wo Contrast  Result Date: 06/10/2018 CLINICAL DATA:  Fall with head trauma EXAM: CT HEAD WITHOUT CONTRAST TECHNIQUE: Contiguous axial images were obtained from the base of the skull through the vertex without intravenous contrast. COMPARISON:  10/01/2014 FINDINGS: Brain: No evidence of acute infarction, hemorrhage, hydrocephalus, extra-axial collection or mass lesion/mass effect. Mild cerebral volume loss Vascular: No hyperdense vessel or unexpected calcification. Skull: Left posterior scalp hematoma and laceration. Negative for calvarial fracture Sinuses/Orbits: No evidence of injury IMPRESSION: 1. Scalp hematoma without fracture. 2. No evidence of intracranial injury. Electronically Signed   By: Monte Fantasia M.D.   On: 06/10/2018 06:46    ED COURSE and MDM  Nursing notes and initial vitals signs, including pulse oximetry, reviewed.  Vitals:   06/10/18 0547  BP: 121/75  Pulse: 85  Resp: 12  Temp: 98.5 F (36.9 C)  TempSrc: Oral  SpO2: 94%   Wound closed by Charlann Lange, PA-C.  CT reassuring.  PROCEDURES    ED DIAGNOSES     ICD-10-CM   1. Fall at home, initial encounter W19.XXXA    Y92.009   2. Laceration of scalp, initial encounter S01.01XA   3. Contusion of scalp, initial encounter S00.Volanda Napoleon, MD 06/10/18 [phone]

## 2018-06-11 DIAGNOSIS — R159 Full incontinence of feces: Secondary | ICD-10-CM | POA: Diagnosis not present

## 2018-06-11 DIAGNOSIS — R630 Anorexia: Secondary | ICD-10-CM | POA: Diagnosis not present

## 2018-06-11 DIAGNOSIS — R634 Abnormal weight loss: Secondary | ICD-10-CM | POA: Diagnosis not present

## 2018-06-11 DIAGNOSIS — R1310 Dysphagia, unspecified: Secondary | ICD-10-CM | POA: Diagnosis not present

## 2018-06-11 DIAGNOSIS — K760 Fatty (change of) liver, not elsewhere classified: Secondary | ICD-10-CM | POA: Diagnosis not present

## 2018-06-11 DIAGNOSIS — R740 Nonspecific elevation of levels of transaminase and lactic acid dehydrogenase [LDH]: Secondary | ICD-10-CM | POA: Diagnosis not present

## 2018-06-11 DIAGNOSIS — K219 Gastro-esophageal reflux disease without esophagitis: Secondary | ICD-10-CM | POA: Diagnosis not present

## 2018-06-11 DIAGNOSIS — K591 Functional diarrhea: Secondary | ICD-10-CM | POA: Diagnosis not present

## 2018-06-12 ENCOUNTER — Ambulatory Visit (INDEPENDENT_AMBULATORY_CARE_PROVIDER_SITE_OTHER): Payer: Medicare Other | Admitting: Psychiatry

## 2018-06-12 ENCOUNTER — Other Ambulatory Visit: Payer: Self-pay

## 2018-06-12 DIAGNOSIS — F331 Major depressive disorder, recurrent, moderate: Secondary | ICD-10-CM | POA: Diagnosis not present

## 2018-06-12 NOTE — Progress Notes (Signed)
[facility] Counselor Initial Adult Exam  Name: Monique Ballard Date: 06/12/2018 MRN: [id number] DOB: May 25, 1940 PCP: Colon Branch, MD  Time spent:  60 minutes   12:00noon to 1:00pm  Virtual Visit Note Connected with patient by a video enabled telemedicine/telehealth application or telephone, with their informed consent, and verified patient privacy and that I am speaking with the correct person using two identifiers.I am at [facility] and patient is at home.  I discussed the limitations, risks, security and privacy concerns of performing psychotherapy and management service by telephone and the availability of in person appointments. I also discussed with the patient that there may be a patient responsible charge related to this service. The patient expressed understanding and agreed to proceed. I discussed the treatment planning with the patient. The patient was provided an opportunity to ask questions and all were answered. The patient agreed with the plan and demonstrated an understanding of the instructions. The patient was advised to call  our office if  symptoms worsen or feel they are in a crisis state and need immediate contact.   Symptoms:  depression, anxiety  Mental Status Exam:   Appearance:   N/A    telehealth  Behavior:  Appropriate and Sharing  Motor:  Per patient-anxious, some depression  Speech/Language:   Clear and Coherent  Affect:  N/A   telehealth  Mood:  anxious  Thought process:  goal directed  Thought content:    WNL  Sensory/Perceptual disturbances:    WNL  Orientation:  oriented to person, place, time/date, situation, day of week, month of year and year  Attention:  Good  Concentration:  Good  Memory:  Recent;   patient reports that it's starting to slip a little  Fund of knowledge:   Good  Insight:    Good  Judgment:   Good  Impulse Control:  Good    Risk Assessment: Danger to Self:  No Self-injurious Behavior: No Danger to Others:  No Duty to Warn:no Physical Aggression / Violence:No  Access to Firearms a concern: No  Gang Involvement:No   Subjective:  Patient reports that she is still experiencing anxiety and depression. Lost her balance and fell, ending up hitting head and had to get 5 staples.  Is ok and CT scan was good, and doctor feels patient should heal and be fine. Also having some GI issues and has June appt with her gastroenterologist. Did bring up the subject to son about him looking for a better paying job and did not mention his eventual moving out. Wanting adult son to move out is very sensitive subject, but patient is feeling strongly about it. Plans to speak with son more about this and also her daughter may speak with patient's son about this. Continued work on "letting go" of some guilt in past yrs. Has also tried to do at least 1 task a day and not be so self-judging.  Did complete 2 tasks and has lessened the self-judging some. Looked at strategies that can help her manage her anxiety and depression, and to follow up with her son about the need to him to have a plan on moving back into a place of his own so patient and husband can have their home without anyone else living there with them. Goal review and next action step discussed.   Diagnoses:    ICD-10-CM   1. Major depressive disorder, recurrent episode, moderate ([place]) F33.1     Plan of Care:  Patient to follow through on  strategies discussed today especially in relation to husband and to their adult son.  Her goals in addition to those on Flowsheet, include: Getting myself back to feeling normal.  (happier, not dwelling on negatives) Getting my son out of the house so we can get our lives back. Improve self esteem. 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

## 2018-06-16 ENCOUNTER — Other Ambulatory Visit: Payer: Self-pay

## 2018-06-16 ENCOUNTER — Other Ambulatory Visit: Payer: Self-pay | Admitting: Psychiatry

## 2018-06-16 ENCOUNTER — Encounter: Payer: Self-pay | Admitting: Psychiatry

## 2018-06-16 ENCOUNTER — Ambulatory Visit (INDEPENDENT_AMBULATORY_CARE_PROVIDER_SITE_OTHER): Payer: Medicare Other | Admitting: Psychiatry

## 2018-06-16 DIAGNOSIS — F411 Generalized anxiety disorder: Secondary | ICD-10-CM | POA: Diagnosis not present

## 2018-06-16 DIAGNOSIS — F4001 Agoraphobia with panic disorder: Secondary | ICD-10-CM | POA: Diagnosis not present

## 2018-06-16 DIAGNOSIS — F5105 Insomnia due to other mental disorder: Secondary | ICD-10-CM | POA: Diagnosis not present

## 2018-06-16 DIAGNOSIS — F331 Major depressive disorder, recurrent, moderate: Secondary | ICD-10-CM

## 2018-06-16 DIAGNOSIS — F1010 Alcohol abuse, uncomplicated: Secondary | ICD-10-CM

## 2018-06-16 MED ORDER — OLANZAPINE 7.5 MG PO TABS: 7.5000 mg | ORAL_TABLET | Freq: Every day | ORAL | 1 refills | Status: DC

## 2018-06-16 NOTE — Progress Notes (Addendum)
Monique Ballard [id number] [birth year] 78 y.o.   Virtual Visit via Telephone Note  I connected with pt by telephone and verified that I am speaking with the correct person using two identifiers.   I discussed the limitations, risks, security and privacy concerns of performing an evaluation and management service by telephone and the availability of in person appointments. I also discussed with the patient that there may be a patient responsible charge related to this service. The patient expressed understanding and agreed to proceed.  I discussed the assessment and treatment plan with the patient. The patient was provided an opportunity to ask questions and all were answered. The patient agreed with the plan and demonstrated an understanding of the instructions.   The patient was advised to call back or seek an in-person evaluation if the symptoms worsen or if the condition fails to improve as anticipated.  I provided 30 minutes of non-face-to-face time during this encounter. The patient was located at home and the provider was located office.   Subjective:   Patient ID:  Monique Ballard is a 78 y.o. (DOB Apr 07, 1940) female.  Chief Complaint:  Chief Complaint  Patient presents with  . Anxiety    Medication management  . Depression    Medication management  . Follow-up    Medication management    Anxiety  Symptoms include nervous/anxious behavior. Patient reports no confusion or suicidal ideas.    Depression         Associated symptoms include no suicidal ideas.  Past medical history includes anxiety.    Monique Ballard presents to the office today for follow-up of depression and anxiety.  1st seen  February 26.  Added Abilify to Lexapro 30.  Stomach ache.  So stopped  And we switched to olanzapine 5 mg and "my mood is much better".    Last visit was in April 14 because of persistent problems with eating and residual problems with depression and anxiety the dose of olanzapine  was increased to 7.5 mg nightly.  She continued on Lexapro 20 mg daily.  Generally doing better.  Saw GI doc Dr. Cristina Gong over diarrhea.  Alighn po is helping GI.  Forcing herself to eat and going better.  Eats a half portion is an improvement.   Pt reports that mood is Anxious and Depressed and describes both  as Moderate. Anxiety symptoms include: Excessive Worry, still mainly about her son. H and she disagree with how to deal with son living with them. Son does help at home and is working but not at sustaining job.  Pt reports no sleep issues and improved with olanzapine. Pt reports that appetite is decreased and improved. Pt reports that energy is down significantly and poor and loss of interest or pleasure in usual activities forces herself to do things and poor motivation. Concentration is good. Suicidal thoughts:  denied by patient.  Sleeping better 6-8 hours.  Made quite a difference. Weight maintaining but overall lost 10# to 150# from 160#.  Anxiety is bearable and before it was not.  I still feel like I need help. Not panic.  Counsellor.  Past Psychiatric Medication Trials: Abilify side effects, Lexapro 20, olanzapine 7.5, Xanax 0.5, clonazepam, Wellbutrin 300 helped her stop smoking but later triggered panic, citalopram 40, duloxetine 60  Review of Systems:  Review of Systems  Neurological: Negative for tremors and weakness.  Psychiatric/Behavioral: Positive for depression and dysphoric mood. Negative for agitation, behavioral problems, confusion, hallucinations, self-injury, sleep disturbance and  suicidal ideas. The patient is nervous/anxious. The patient is not hyperactive.     Medications: I have reviewed the patient's current medications.  Current Outpatient Medications  Medication Sig Dispense Refill  . ALPRAZolam (XANAX) 0.25 MG tablet Take 1-2 tablets (0.25-0.5 mg total) by mouth 2 (two) times daily as needed for anxiety. 60 tablet 0  . calcium-vitamin D (OSCAL 500/200  D-3) 500-200 MG-UNIT per tablet Take 1 tablet by mouth daily.      Marland Kitchen escitalopram (LEXAPRO) 20 MG tablet Take 1 tablet (20 mg total) by mouth daily. 90 tablet 1  . metoprolol tartrate (LOPRESSOR) 50 MG tablet Take 1 tablet (50 mg total) by mouth 2 (two) times daily. 180 tablet 1  . Multiple Vitamins-Minerals (CENTRUM SILVER PO) Take 1 each by mouth daily.      Marland Kitchen OLANZapine (ZYPREXA) 5 MG tablet TAKE ONE TABLET BY MOUTH TWO TO THREE HOURS PRIOR TO BEDTIME 30 tablet 0  . pantoprazole (PROTONIX) 40 MG tablet Take 1 tablet (40 mg total) by mouth daily. 90 tablet 3  . Probiotic Product (ALIGN PO) Take by mouth daily.     No current facility-administered medications for this visit.     Medication Side Effects: None  Allergies:  Allergies  Allergen Reactions  . Penicillins Rash    Has patient had a PCN reaction causing immediate rash, facial/tongue/throat swelling, SOB or lightheadedness with hypotension: Yes Has patient had a PCN reaction causing severe rash involving mucus membranes or skin necrosis: No Has patient had a PCN reaction that required hospitalization No Has patient had a PCN reaction occurring within the last 10 years: No If all of the above answers are "NO", then may proceed with Cephalosporin use.     Past Medical History:  Diagnosis Date  . Breast cancer ([place]) fall 1997    Left Lumpectomy, XRT.  treated with Tamoxifem and Evista  . Depression   . Endometriosis in her 20's   surgery to help clear endometriosis to obtain a pregnancy.  Marland Kitchen GERD (gastroesophageal reflux disease)    w/"stretching" remotely  . HTN (hypertension)     Family History  Problem Relation Age of Onset  . Hypertension Mother   . Stroke Mother   . Heart attack Father        elderly  . Heart disease Father        CHF  . Throat cancer Other   . Breast cancer Neg Hx   . Colon cancer Neg Hx   . Diabetes Neg Hx     Social History   Socioeconomic History  . Marital status: Married    Spouse  name: Not on file  . Number of children: 2  . Years of education: Not on file  . Highest education level: Not on file  Occupational History  . Occupation: retired  Scientific laboratory technician  . Financial resource strain: Not on file  . Food insecurity:    Worry: Not on file    Inability: Not on file  . Transportation needs:    Medical: Not on file    Non-medical: Not on file  Tobacco Use  . Smoking status: Former Research scientist (life sciences)  . Smokeless tobacco: Never Used  . Tobacco comment: Quit >30 yrs ago  Substance and Sexual Activity  . Alcohol use: Yes    Comment: 2 glass wine dialy  . Drug use: No  . Sexual activity: Never    Birth control/protection: None  Lifestyle  . Physical activity:    Days per week:  Not on file    Minutes per session: Not on file  . Stress: Not on file  Relationships  . Social connections:    Talks on phone: Not on file    Gets together: Not on file    Attends religious service: Not on file    Active member of club or organization: Not on file    Attends meetings of clubs or organizations: Not on file    Relationship status: Not on file  . Intimate partner violence:    Fear of current or ex partner: Not on file    Emotionally abused: Not on file    Physically abused: Not on file    Forced sexual activity: Not on file  Other Topics Concern  . 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   Please see review of systems for further details on the patient's review from today.   Objective:   Physical Exam:  LMP 01/22/1992 (Within Months)   Physical Exam Neurological:     Mental Status: She is alert and oriented to person, place,  and time.     Cranial Nerves: No dysarthria.  Psychiatric:        Attention and Perception: Attention normal. She does not perceive auditory hallucinations.        Mood and Affect: Mood is anxious and depressed.        Speech: Speech normal.        Behavior: Behavior is cooperative.        Thought Content: Thought content normal. Thought content is not paranoid or delusional. Thought content does not include homicidal or suicidal ideation. Thought content does not include homicidal or suicidal plan.        Cognition and Memory: Cognition and memory normal.        Judgment: Judgment normal.     Comments: Insight fair.  The anxiety is greater in severity than the depression     Lab Review:     Component Value Date/Time   NA 136 05/12/2018 1443   K 3.8 05/12/2018 1443   CL 102 05/12/2018 1329   CO2 22 05/12/2018 1329   GLUCOSE 118 (H) 05/12/2018 1329   BUN 8 05/12/2018 1329   CREATININE 0.64 05/12/2018 1329   CREATININE 0.79 12/22/2015 1503   CALCIUM 8.6 (L) 05/12/2018 1329   PROT 6.4 (L) 05/12/2018 1329   ALBUMIN 2.8 (L) 05/12/2018 1329   AST 78 (H) 05/12/2018 1329   ALT 39 05/12/2018 1329   ALKPHOS 148 (H) 05/12/2018 1329   BILITOT 1.9 (H) 05/12/2018 1329   GFRNONAA >60 05/12/2018 1329   GFRAA >60 05/12/2018 1329       Component Value Date/Time   WBC 14.8 (H) 05/12/2018 1329   RBC 3.66 (L) 05/12/2018 1329   HGB 10.9 (L) 05/12/2018 1443   HCT 32.0 (L) 05/12/2018 1443   PLT 348 05/12/2018 1329   MCV 100.5 (H) 05/12/2018 1329   MCH 33.1 05/12/2018 1329   MCHC 32.9 05/12/2018 1329   RDW 15.5 05/12/2018 1329   LYMPHSABS 2.8 05/12/2018 1329   MONOABS 2.2 (H) 05/12/2018 1329  EOSABS 0.2 05/12/2018 1329   BASOSABS 0.2 (H) 05/12/2018 1329    No results found for: POCLITH, LITHIUM   No results found for: PHENYTOIN, PHENOBARB, VALPROATE, CBMZ   .res Assessment: Plan:    Generalized anxiety disorder  Major depressive disorder, recurrent episode, moderate  (H[facility])  Panic disorder with agoraphobia  Insomnia due to mental condition  Excessive drinking alcohol   She has had a severe and prolonged depression and anxiety episode lasting since March 2019 triggered by behavior of her son who embezzled money and got fired from a job.  She has not been overpull to get over her disappointment about him and now he is living with them.  She has had a partial response with the addition of olanzapine.  She did not tolerate Abilify.  Eat enough protein or else meds won't work.  Drinks protein shakes.    Option increase olanzapine, mirtazapine.  She has had a partial improvement in the olanzapine but did not see the usual appetite benefit.  She has had no side effects.  Therefore, for treatment resistant anxiety and depression increase Olanzapine to 7.5mg  in evening. (She was mistakenly taking only 5mg  a day).    Discussed potential metabolic side effects associated with atypical antipsychotics, as well as potential risk for movement side effects. Advised pt to contact office if movement side effects occur.   If no improvement further after 2-3 weeks then call and consider mirtazapine or increasing the olanzapine further.  Discussed in detail specific counseling techniques such as grief techniques and cognitive behavior therapy to help her deal with the incident with her son which has precipitated this depression.  She is had some counseling.  We discussed the attributes of the counselors that she found helpful and not helpful as a way of picking another therapist.  Strongly encourage counseling as noted last time.  Has seen Oliver Pila, [facility], and another.  Butch Penny was helpful, but not covered by Medicare.   She wants to see a new therapist and will refer to Rinaldo Cloud, [place].  That's going well with Debbie.  FU 6 weeks  Lynder Parents, MD, DFAPA   Please see After Visit Summary for patient specific instructions.  Future Appointments  Date Time Provider  [place]  06/17/2018  2:40 PM Colon Branch, MD [facility]  06/19/2018 11:00 AM Shanon Ace, LCSW [facility] None  06/26/2018  9:00 AM Shanon Ace, LCSW [facility] None  07/14/2018  1:40 PM Colon Branch, MD [facility]  10/02/2018  1:00 PM Colon Branch, MD [facility]  04/26/2019  2:00 PM Dennis Bast, RN [facility]    No orders of the defined types were placed in this encounter.     -------------------------------

## 2018-06-17 ENCOUNTER — Ambulatory Visit (INDEPENDENT_AMBULATORY_CARE_PROVIDER_SITE_OTHER): Payer: Medicare Other | Admitting: Internal Medicine

## 2018-06-17 ENCOUNTER — Other Ambulatory Visit: Payer: Self-pay

## 2018-06-17 ENCOUNTER — Encounter: Payer: Self-pay | Admitting: Internal Medicine

## 2018-06-17 VITALS — BP 121/72 | HR 78 | Temp 98.1°F | Resp 16 | Ht 62.0 in | Wt 152.0 lb

## 2018-06-17 DIAGNOSIS — S0101XD Laceration without foreign body of scalp, subsequent encounter: Secondary | ICD-10-CM | POA: Diagnosis not present

## 2018-06-17 DIAGNOSIS — R197 Diarrhea, unspecified: Secondary | ICD-10-CM | POA: Diagnosis not present

## 2018-06-17 DIAGNOSIS — F419 Anxiety disorder, unspecified: Secondary | ICD-10-CM | POA: Diagnosis not present

## 2018-06-17 DIAGNOSIS — F329 Major depressive disorder, single episode, unspecified: Secondary | ICD-10-CM

## 2018-06-17 DIAGNOSIS — F32A Depression, unspecified: Secondary | ICD-10-CM

## 2018-06-17 NOTE — Patient Instructions (Addendum)
   GO TO THE FRONT DESK  Cancel the appointment for June, keep the one for September

## 2018-06-17 NOTE — Progress Notes (Signed)
Pre visit review using our clinic review tool, if applicable. No additional management support is needed unless otherwise documented below in the visit note. 

## 2018-06-17 NOTE — Progress Notes (Signed)
Subjective:    Patient ID: Monique Ballard, female    DOB: 01/19/1941, 78 y.o.   MRN: [id number]  DOS:  06/17/2018 Type of visit - description: acute Went to the ER after a fall, had scalp laceration repair, CT head nonacute. Here for staples removal.  Wt Readings from Last 3 Encounters:  06/17/18 152 lb (68.9 kg)  05/12/18 150 lb (68 kg)  03/31/18 150 lb 8 oz (68.3 kg)   Review of Systems Denies headache or neck pain Area of the laceration with no redness, no discharge. Diarrhea, see last visit, improving.  Past Medical History:  Diagnosis Date  . Breast cancer ([place]) fall 1997    Left Lumpectomy, XRT.  treated with Tamoxifem and Evista  . Depression   . Endometriosis in her 20's   surgery to help clear endometriosis to obtain a pregnancy.  Marland Kitchen GERD (gastroesophageal reflux disease)    w/"stretching" remotely  . HTN (hypertension)     Past Surgical History:  Procedure Laterality Date  . BREAST LUMPECTOMY Left fall 1997   Lymph nodes X 3 were negative, ER+, Radiation treatmetn.  Took Tamoxifem X 5 yrs then Evista for 3-5 yrs.  . CESAREAN SECTION     x2  first pregnancy breach and second normal  . PELVIC FRACTURE SURGERY     endometriosis, remotely    Social History   Socioeconomic History  . Marital status: Married    Spouse name: Not on file  . Number of children: 2  . Years of education: Not on file  . Highest education level: Not on file  Occupational History  . Occupation: retired  Scientific laboratory technician  . Financial resource strain: Not on file  . Food insecurity:    Worry: Not on file    Inability: Not on file  . Transportation needs:    Medical: Not on file    Non-medical: Not on file  Tobacco Use  . Smoking status: Former Research scientist (life sciences)  . Smokeless tobacco: Never Used  . Tobacco comment: Quit >30 yrs ago  Substance and Sexual Activity  . Alcohol use: Yes    Comment: 2 glass wine dialy  . Drug use: No  . Sexual activity: Never    Birth control/protection: None   Lifestyle  . Physical activity:    Days per week: Not on file    Minutes per session: Not on file  . Stress: Not on file  Relationships  . Social connections:    Talks on phone: Not on file    Gets together: Not on file    Attends religious service: Not on file    Active member of club or organization: Not on file    Attends meetings of clubs or organizations: Not on file    Relationship status: Not on file  . Intimate partner violence:    Fear of current or ex partner: Not on file    Emotionally abused: Not on file    Physically abused: Not on file    Forced sexual activity: Not on file  Other Topics Concern  . 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      Medication List       Accurate as of Jun 17, 2018 11:59 PM. If you have any questions, ask your nurse or doctor.        ALIGN PO Take by mouth daily.   ALPRAZolam 0.25 MG tablet Commonly known as:  XANAX Take 1-2 tablets (0.25-0.5 mg total) by mouth 2 (two) times daily as needed for anxiety.   CENTRUM SILVER PO Take 1 each by mouth daily.   escitalopram 20 MG tablet Commonly known as:  LEXAPRO Take 1 tablet (20 mg total) by mouth daily.   metoprolol tartrate 50 MG tablet Commonly  known as:  LOPRESSOR Take 1 tablet (50 mg total) by mouth 2 (two) times daily.   OLANZapine 5 MG tablet Commonly known as:  ZYPREXA TAKE ONE TABLET BY MOUTH TWO TO THREE HOURS PRIOR TO BEDTIME What changed:  Another medication with the same name was removed. Continue taking this medication, and follow the directions you see here. Changed by:  Kathlene November, MD   ondansetron 4 MG disintegrating tablet Commonly known as:  ZOFRAN-ODT Take 4 mg by mouth every 8 (eight) hours as needed for nausea or vomiting.   Oscal 500/200 D-3 500-200 MG-UNIT tablet Generic drug:  calcium-vitamin D Take 1 tablet by mouth daily.   pantoprazole 40 MG tablet Commonly known as:  PROTONIX Take 1 tablet (40 mg total) by mouth daily.           Objective:   Physical Exam BP 121/72 (BP Location: Right Arm, Patient Position: Sitting, Cuff Size: Small)   Pulse 78   Temp 98.1 F (36.7 C) (Oral)   Resp 16   Ht 5\' 2"  (1.575 m)   Wt 152 lb (68.9 kg)   LMP 01/22/1992 (Within Months)   SpO2 97%   BMI 27.80 kg/m  General:   Well developed, NAD, BMI noted. HEENT:  Normocephalic . Face symmetric, atraumatic Scalp: Has a laceration with 5 staples behind the left ear, no redness swelling or discharge. The wound is not 100% healed but I think is ready for staple removal. Neurologic:  alert & oriented X3.  Speech normal, gait appropriate for age and unassisted Psych--  Cognition and judgment appear intact.  Cooperative with normal attention span and concentration.  Behavior appropriate. No anxious or depressed appearing.      Assessment       Assessment HTN Depression, Anxiety Tremor dx 11-2014 GERD with esophageal stricture L Breast cancer, 1997, lumpectomy, XRT, released from oncology  PLAN: Scalp laceration: 5 staples removed without problems.  Watch for redness, swelling or discharge. Anxiety depression: Follow-up by psychiatry, the patient is also seeing a therapist and is helping  significantly Weight loss: Has gained 2 pounds since the last visit Diarrhea: Stool test was negative, she had virtual visit with GI, was Rx align, doing better RTC: September 2020 already scheduled

## 2018-06-18 NOTE — Assessment & Plan Note (Signed)
Scalp laceration: 5 staples removed without problems.  Watch for redness, swelling or discharge. Anxiety depression: Follow-up by psychiatry, the patient is also seeing a therapist and is helping significantly Weight loss: Has gained 2 pounds since the last visit Diarrhea: Stool test was negative, she had virtual visit with GI, was Rx align, doing better RTC: September 2020 already scheduled

## 2018-06-19 ENCOUNTER — Other Ambulatory Visit: Payer: Self-pay

## 2018-06-19 ENCOUNTER — Ambulatory Visit (INDEPENDENT_AMBULATORY_CARE_PROVIDER_SITE_OTHER): Payer: Medicare Other | Admitting: Psychiatry

## 2018-06-19 DIAGNOSIS — F411 Generalized anxiety disorder: Secondary | ICD-10-CM | POA: Diagnosis not present

## 2018-06-19 NOTE — Progress Notes (Signed)
[facility] Counsel[facility] Initial Adult Exam  Name: Monique Ballard Date: 06/19/2018 MRN: [id number] DOB: 1940/03/08 PCP: Colon Branch, MD  Time spent:  60 minutes   11:00am to 12:00noon  Virtual Visit Note Connected with patient by a video enabled telemedicine/telehealth application or telephone, with their informed consent, and verified patient privacy and that I am speaking with the correct person using two identifiers.I am at [facility] and patient is at home.  I discussed the limitations, risks, security and privacy concerns of performing psychotherapy and management service by telephone and the availability of in person appointments. I also discussed with the patient that there may be a patient responsible charge related to this service. The patient expressed understanding and agreed to proceed. I discussed the treatment planning with the patient. The patient was provided an opportunity to ask questions and all were answered. The patient agreed with the plan and demonstrated an understanding of the instructions. The patient was advised to call  our office if  symptoms worsen or feel they are in a crisis state and need immediate contact.   Symptoms:  depression, anxiety, frustration  Mental Status Exam:   Appearance:   N/A    telehealth  Behavior:  Appropriate and Sharing  Motor:  Per patient-anxious, some depression  Speech/Language:   Clear and Coherent  Affect:  N/A   telehealth  Mood:  anxious  Thought process:  goal directed  Thought content:    WNL  Sensory/Perceptual disturbances:    WNL  Orientation:  oriented to person, place, time/date, situation, day of week, month of year and year  Attention:  Good  Concentration:  Good  Memory:  Recent;   patient reports that it's starting to slip a little  Fund of knowledge:   Good  Insight:    Good  Judgment:   Good  Impulse Control:  Good    Risk Assessment: Danger to Self:  No Self-injurious Behavior: No Danger to  Others: No Duty to Warn:no Physical Aggression / Violence:No  Access to Firearms a concern: No  Gang Involvement:No   Subjective:  Patient states that she is still experiencing anxiety and depression, along with frustrations with husband because he does not support patient re: growing issue with adult son living in the home. Really struggling with the issue involving her son and having difficulty getting him to take it serious and be willing to make some changes going forward.  Not having her husband's support is not helping.  Talked about what she wants and plans to do next in this situation.  We discussed how to communicate more effectively in a way that son might better hear and accept her message, and patient took notes as we talked.  Looked at good communication skills including paying attention to "how we say, what we say", active listening without any interruptions, and asking for clarification when needed."   Has a June appt with her gastroenterologist per GI issues recently. Still trying to do at least 1 task a day and not be so self-judging. Has lessened the self-judging some. Looked at strategies that can help her manage her anxiety and depression, and plans to  follow up with her son again about the need to him to have a plan on moving back into a place of his own so patient and husband can have their home without anyone else living there with them. Son was not very responsive the first time patient spoke to him about this. Goal review and next  action step discussed.   Diagnoses:    ICD-10-CM   1. Generalized anxiety disorder F41.1     Plan of Care:  Patient to follow through on strategies discussed today especially in relation to husband and to their adult son. Appt scheduled for 06/26/2018.   Her goals in addition to those on Flowsheet, include: Getting myself back to feeling normal.  (happier, not dwelling on negatives) Getting my son out of the house so we can get our lives  back. Improve self esteem. 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

## 2018-06-26 ENCOUNTER — Other Ambulatory Visit: Payer: Self-pay

## 2018-06-26 ENCOUNTER — Ambulatory Visit (INDEPENDENT_AMBULATORY_CARE_PROVIDER_SITE_OTHER): Payer: Medicare Other | Admitting: Psychiatry

## 2018-06-26 DIAGNOSIS — F411 Generalized anxiety disorder: Secondary | ICD-10-CM | POA: Diagnosis not present

## 2018-06-26 NOTE — Progress Notes (Signed)
[facility] Counsel[facility] Initial Adult Exam  Name: Monique Ballard Date: 06/26/2018 MRN: [id number] DOB: [birth year] PCP: Colon Branch, MD  Time spent:  60 minutes   9:00am to 10:00am  Virtual Visit Note Connected with patient by a video enabled telemedicine/telehealth application or telephone, with their informed consent, and verified patient privacy and that I am speaking with the correct person using two identifiers.I am at [facility] and patient is at home.  I discussed the limitations, risks, security and privacy concerns of performing psychotherapy and management service by telephone and the availability of in person appointments. I also discussed with the patient that there may be a patient responsible charge related to this service. The patient expressed understanding and agreed to proceed. I discussed the treatment planning with the patient. The patient was provided an opportunity to ask questions and all were answered. The patient agreed with the plan and demonstrated an understanding of the instructions. The patient was advised to call  our office if  symptoms worsen or feel they are in a crisis state and need immediate contact.   Symptoms:  depression, anxiety, frustration  Mental Status Exam:   Appearance:   N/A    telehealth  Behavior:  Appropriate and Sharing  Motor:  Per patient-anxious, some depression  Speech/Language:   Clear and Coherent  Affect:  N/A   telehealth  Mood:  anxious  Thought process:  goal directed  Thought content:    WNL  Sensory/Perceptual disturbances:    WNL  Orientation:  oriented to person, place, time/date, situation, day of week, month of year and year  Attention:  Good  Concentration:  Good  Memory:  Recent;   patient reports that it's starting to slip a little  Fund of knowledge:   Good  Insight:    Good  Judgment:   Good  Impulse Control:  Good    Risk Assessment: Danger to Self:  No Self-injurious Behavior: No Danger to  Others: No Duty to Warn:no Physical Aggression / Violence:No  Access to Firearms a concern: No  Gang Involvement:No    Any new medications or health concerns since last appt: Patient reports "none".   Subjective:  Patient reports today that she hasnt been able to follow up on goal work since last session as she had not been feeling well physically due to gastrointestinal issues this past week. Is beginning to feel some better now and will be following up over the next 2 weeks. Patient states that she is still experiencing anxiety and depression, along with frustrations with husband because he does not support patient re: growing issue with adult son living in the home. Really struggling with the issue involving her son and having difficulty getting son or husband to take it serious and be willing to make some changes going forward.  We reviewed some communication points to help patient more effectively speak with her son hoping he might hear her better and accept her message.  Looked at good communication skills including paying attention to "how we say, what we say", active listening without any interruptions, and asking for clarification when needed."    Still trying to do at least 1 task a day and not be so self-judging. Has lessened the self-judging some. Looked at strategies that can help her manage her anxiety and depression, and plans to  follow up with her son again about the need to him to have a plan on moving back into a place of his own so patient  and husband can have their home without anyone else living there with them. Son has not been very responsive when  patient has spoken with him previously about this. Goal review, with some progress noted including increased motivation for change and not dwelling on negatives quite as much., a Next action step discussed and patient is receptive.   Diagnoses:    ICD-10-CM   1. Generalized anxiety disorder F41.1     Plan of Care:  Patient to  follow through on strategies discussed today especially in relation to husband and to their adult son. Appt scheduled in 3 wks.   Her goals in addition to those on Flowsheet, include: Getting myself back to feeling normal.  (happier, not dwelling on negatives) Getting my son out of the house so we can get our lives back. Improve self esteem. 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

## 2018-06-30 ENCOUNTER — Telehealth: Payer: Self-pay | Admitting: Internal Medicine

## 2018-06-30 NOTE — Telephone Encounter (Signed)
Alprazolam refill.   Last OV: 06/17/2018 Last Fill: 06/01/2018 #60 and 0RF UDS: 04/21/2017 Low risk

## 2018-06-30 NOTE — Telephone Encounter (Signed)
sent 

## 2018-07-14 ENCOUNTER — Ambulatory Visit: Payer: Medicare Other | Admitting: Internal Medicine

## 2018-07-17 DIAGNOSIS — R630 Anorexia: Secondary | ICD-10-CM | POA: Diagnosis not present

## 2018-07-17 DIAGNOSIS — R740 Nonspecific elevation of levels of transaminase and lactic acid dehydrogenase [LDH]: Secondary | ICD-10-CM | POA: Diagnosis not present

## 2018-07-17 DIAGNOSIS — K219 Gastro-esophageal reflux disease without esophagitis: Secondary | ICD-10-CM | POA: Diagnosis not present

## 2018-07-17 DIAGNOSIS — R634 Abnormal weight loss: Secondary | ICD-10-CM | POA: Diagnosis not present

## 2018-07-17 DIAGNOSIS — K591 Functional diarrhea: Secondary | ICD-10-CM | POA: Diagnosis not present

## 2018-07-17 DIAGNOSIS — K760 Fatty (change of) liver, not elsewhere classified: Secondary | ICD-10-CM | POA: Diagnosis not present

## 2018-07-17 DIAGNOSIS — R159 Full incontinence of feces: Secondary | ICD-10-CM | POA: Diagnosis not present

## 2018-07-17 DIAGNOSIS — R1310 Dysphagia, unspecified: Secondary | ICD-10-CM | POA: Diagnosis not present

## 2018-07-20 ENCOUNTER — Ambulatory Visit: Payer: Medicare Other | Admitting: Psychiatry

## 2018-07-26 ENCOUNTER — Other Ambulatory Visit: Payer: Self-pay | Admitting: Internal Medicine

## 2018-07-30 DIAGNOSIS — K591 Functional diarrhea: Secondary | ICD-10-CM | POA: Diagnosis not present

## 2018-08-12 DIAGNOSIS — K591 Functional diarrhea: Secondary | ICD-10-CM | POA: Diagnosis not present

## 2018-08-13 ENCOUNTER — Other Ambulatory Visit: Payer: Self-pay | Admitting: Internal Medicine

## 2018-08-21 ENCOUNTER — Other Ambulatory Visit: Payer: Self-pay | Admitting: Psychiatry

## 2018-09-11 ENCOUNTER — Telehealth: Payer: Self-pay | Admitting: Internal Medicine

## 2018-09-11 NOTE — Telephone Encounter (Signed)
Alprazolam Rx.   Last OV: 06/17/2018 Last Fill: 06/30/2018 #60 and 1RF Pt sig: 1-2 bid prn UDS: 04/21/2017 Low risk

## 2018-09-11 NOTE — Telephone Encounter (Signed)
Database checked, okay to refill Xanax

## 2018-09-14 DIAGNOSIS — K591 Functional diarrhea: Secondary | ICD-10-CM | POA: Diagnosis not present

## 2018-10-01 ENCOUNTER — Other Ambulatory Visit: Payer: Self-pay

## 2018-10-02 ENCOUNTER — Ambulatory Visit (INDEPENDENT_AMBULATORY_CARE_PROVIDER_SITE_OTHER): Payer: Medicare Other | Admitting: Internal Medicine

## 2018-10-02 ENCOUNTER — Encounter: Payer: Self-pay | Admitting: Internal Medicine

## 2018-10-02 VITALS — BP 124/63 | HR 73 | Temp 98.2°F | Resp 18 | Wt 149.0 lb

## 2018-10-02 DIAGNOSIS — F419 Anxiety disorder, unspecified: Secondary | ICD-10-CM

## 2018-10-02 DIAGNOSIS — F329 Major depressive disorder, single episode, unspecified: Secondary | ICD-10-CM

## 2018-10-02 DIAGNOSIS — D649 Anemia, unspecified: Secondary | ICD-10-CM

## 2018-10-02 DIAGNOSIS — R197 Diarrhea, unspecified: Secondary | ICD-10-CM | POA: Diagnosis not present

## 2018-10-02 DIAGNOSIS — R269 Unspecified abnormalities of gait and mobility: Secondary | ICD-10-CM | POA: Diagnosis not present

## 2018-10-02 DIAGNOSIS — I10 Essential (primary) hypertension: Secondary | ICD-10-CM

## 2018-10-02 DIAGNOSIS — Z23 Encounter for immunization: Secondary | ICD-10-CM

## 2018-10-02 LAB — CBC WITH DIFFERENTIAL/PLATELET
Basophils Absolute: 0.1 10*3/uL (ref 0.0–0.1)
Basophils Relative: 0.6 % (ref 0.0–3.0)
Eosinophils Absolute: 0.1 10*3/uL (ref 0.0–0.7)
Eosinophils Relative: 0.9 % (ref 0.0–5.0)
HCT: 48.8 % — ABNORMAL HIGH (ref 36.0–46.0)
Hemoglobin: 16.3 g/dL — ABNORMAL HIGH (ref 12.0–15.0)
Lymphocytes Relative: 22.1 % (ref 12.0–46.0)
Lymphs Abs: 1.9 10*3/uL (ref 0.7–4.0)
MCHC: 33.3 g/dL (ref 30.0–36.0)
MCV: 102.5 fl — ABNORMAL HIGH (ref 78.0–100.0)
Monocytes Absolute: 1.7 10*3/uL — ABNORMAL HIGH (ref 0.1–1.0)
Monocytes Relative: 19.6 % — ABNORMAL HIGH (ref 3.0–12.0)
Neutro Abs: 4.9 10*3/uL (ref 1.4–7.7)
Neutrophils Relative %: 56.8 % (ref 43.0–77.0)
Platelets: 247 10*3/uL (ref 150.0–400.0)
RBC: 4.76 Mil/uL (ref 3.87–5.11)
RDW: 15.3 % (ref 11.5–15.5)
WBC: 8.6 10*3/uL (ref 4.0–10.5)

## 2018-10-02 LAB — FERRITIN: Ferritin: 137 ng/mL (ref 10.0–291.0)

## 2018-10-02 LAB — IRON: Iron: 177 ug/dL — ABNORMAL HIGH (ref 42–145)

## 2018-10-02 NOTE — Progress Notes (Signed)
Subjective:    Patient ID: Monique Ballard, female    DOB: [birth year], 78 y.o.   MRN: [id number]  DOS:  10/02/2018 Type of visit - description: rov Routine follow-up, in general feels very well. Weight at home is around 150 pounds. Appetite is still decreased but she is forcing herself to eat. Diarrhea is controlled with medications EtOH: Reports 2 drinks at night  Wt Readings from Last 3 Encounters:  10/02/18 149 lb (67.6 kg)  06/17/18 152 lb (68.9 kg)  05/12/18 150 lb (68 kg)    Review of Systems  Denies fever chills No nausea, vomiting.  No blood in the stools.  Past Medical History:  Diagnosis Date  . Breast cancer ([place]) fall 1997    Left Lumpectomy, XRT.  treated with Tamoxifem and Evista  . Depression   . Endometriosis in her 20's   surgery to help clear endometriosis to obtain a pregnancy.  Marland Kitchen GERD (gastroesophageal reflux disease)    w/"stretching" remotely  . HTN (hypertension)     Past Surgical History:  Procedure Laterality Date  . BREAST LUMPECTOMY Left fall 1997   Lymph nodes X 3 were negative, ER+, Radiation treatmetn.  Took Tamoxifem X 5 yrs then Evista for 3-5 yrs.  . CESAREAN SECTION     x2  first pregnancy breach and second normal  . PELVIC FRACTURE SURGERY     endometriosis, remotely    Social History   Socioeconomic History  . Marital status: Married    Spouse name: Not on file  . Number of children: 2  . Years of education: Not on file  . Highest education level: Not on file  Occupational History  . Occupation: retired  Scientific laboratory technician  . Financial resource strain: Not on file  . Food insecurity    Worry: Not on file    Inability: Not on file  . Transportation needs    Medical: Not on file    Non-medical: Not on file  Tobacco Use  . Smoking status: Former Research scientist (life sciences)  . Smokeless tobacco: Never Used  . Tobacco comment: Quit >30 yrs ago  Substance and Sexual Activity  . Alcohol use: Yes    Comment: 2 glass wine dialy  . Drug use: No   . Sexual activity: Never    Birth control/protection: None  Lifestyle  . Physical activity    Days per week: Not on file    Minutes per session: Not on file  . Stress: Not on file  Relationships  . Social Herbalist on phone: Not on file    Gets together: Not on file    Attends religious service: Not on file    Active member of club or organization: Not on file    Attends meetings of clubs or organizations: Not on file    Relationship status: Not on file  . Intimate partner violence    Fear of current or ex partner: Not on file    Emotionally abused: Not on file    Physically abused: Not on file    Forced sexual activity: Not on file  Other Topics Concern  . 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      Medication List       Accurate as of October 02, 2018 11:59 PM. If you have any questions, ask your nurse or doctor.        ALIGN PO Take by mouth daily.   ALPRAZolam 0.25 MG tablet Commonly known as: XANAX TAKE ONE TO TWO TABLETS BY MOUTH TWICE DAILY AS NEEDED FOR ANXIETY   CENTRUM SILVER PO Take 1 each by mouth daily.   escitalopram 20 MG tablet Commonly known as: LEXAPRO Take 1 tablet (20 mg total) by mouth daily.    metoprolol tartrate 50 MG tablet Commonly known as: LOPRESSOR Take 1 tablet (50 mg total) by mouth 2 (two) times daily.   OLANZapine 5 MG tablet Commonly known as: ZYPREXA TAKE ONE TABLET BY MOUTH TWO TO THREE HOURS PRIOR TO BEDTIME   OLANZapine 7.5 MG tablet Commonly known as: ZYPREXA TAKE ONE TABLET BY MOUTH DAILY AT BEDTIME   ondansetron 4 MG disintegrating tablet Commonly known as: ZOFRAN-ODT Take 4 mg by mouth every 8 (eight) hours as needed for nausea or vomiting.   Oscal 500/200 D-3 500-200 MG-UNIT tablet Generic drug: calcium-vitamin D Take 1 tablet by mouth daily.   pantoprazole 40 MG tablet Commonly known as: PROTONIX Take 1 tablet (40 mg total) by mouth daily.           Objective:   Physical Exam BP 124/63 (BP Location: Left Arm, Patient Position: Sitting, Cuff Size: Normal)   Pulse 73   Temp 98.2 F (36.8 C) (Temporal)   Resp 18   Wt 149 lb (67.6 kg)   LMP 01/22/1992 (Within Months)   SpO2 93%   BMI 27.25 kg/m  General:   Well developed, NAD, BMI noted.  HEENT:  Normocephalic . Face symmetric, atraumatic Lungs:  CTA B Normal respiratory effort, no intercostal retractions, no accessory muscle use. Heart: RRR,  no murmur.  no pretibial edema bilaterally  Abdomen:  Examined while the patient was sitting, soft, nontender, nondistended.    Skin: Not pale. Not jaundice Neurologic:  alert & oriented X3.  Speech normal, gait assisted by a cane  Psych--  Cognition and judgment appear intact.  Cooperative with normal attention span and concentration.  Behavior appropriate. No anxious or depressed appearing.     Assessment    Assessment HTN Depression, Anxiety Tremor dx 11-2014 Gait d/o: unsteady onset ~ 2017 GERD with esophageal stricture L Breast cancer, 1997, lumpectomy, XRT, released from oncology ETOH  PLAN: [id number] controlled on metoprolol. Depression anxiety: Managed by psychiatry, sees a counselor, on Lexapro and Zyprexa.   Reports she is doing well. Gait disorder: Uses a cane, no falls since the last visit Diarrhea: Still an issue, using align and Imodium as prescribed by GI.  Symptoms minimal with medication. Anemia: Per chart review, no blood in the stools no abdominal pain.  Check a CBC, iron, ferritin High-dose flu shot today RTC 4 months

## 2018-10-02 NOTE — Patient Instructions (Addendum)
GO TO THE LAB : Get the blood work     GO TO THE FRONT DESK Schedule your next appointment for a   checkup in 4 months  

## 2018-10-04 NOTE — Assessment & Plan Note (Signed)
[id number] controlled on metoprolol. Depression anxiety: Managed by psychiatry, sees a counselor, on Lexapro and Zyprexa.  Reports she is doing well. Gait disorder: Uses a cane, no falls since the last visit Diarrhea: Still an issue, using align and Imodium as prescribed by GI.  Symptoms minimal with medication. Anemia: Per chart review, no blood in the stools no abdominal pain.  Check a CBC, iron, ferritin High-dose flu shot today RTC 4 months

## 2018-11-24 ENCOUNTER — Telehealth: Payer: Self-pay | Admitting: Internal Medicine

## 2018-11-24 NOTE — Telephone Encounter (Signed)
Sent!

## 2018-11-24 NOTE — Telephone Encounter (Signed)
Alprazolam refill.   Last OV: 10/02/2018 Last Fill: 09/11/2018 #60 and 1RF Pt sig: 1-2 tab bid prn UDS: 04/21/2017 Low risk

## 2018-12-13 ENCOUNTER — Other Ambulatory Visit: Payer: Self-pay | Admitting: Psychiatry

## 2018-12-14 NOTE — Telephone Encounter (Signed)
Last apt 05/26, was due for 6 week follow up.

## 2018-12-15 NOTE — Telephone Encounter (Signed)
Call to reschedule so she can get med refills.  She is overdue for appointment.

## 2018-12-16 NOTE — Telephone Encounter (Signed)
Left a message for patient to call to schedule follow up

## 2018-12-29 ENCOUNTER — Encounter: Payer: Self-pay | Admitting: Internal Medicine

## 2018-12-29 ENCOUNTER — Ambulatory Visit: Payer: Medicare Other | Admitting: Internal Medicine

## 2018-12-29 ENCOUNTER — Other Ambulatory Visit: Payer: Self-pay

## 2018-12-29 VITALS — Ht 62.0 in | Wt 149.4 lb

## 2018-12-29 NOTE — Progress Notes (Signed)
   Subjective:    Patient ID: Monique Ballard, female    DOB: 1940/10/25, 78 y.o.   MRN: [id number]  DOS:  12/29/2018 Type of visit - description:  Cancelled

## 2018-12-29 NOTE — Progress Notes (Signed)
Pre visit review using our clinic review tool, if applicable. No additional management support is needed unless otherwise documented below in the visit note. 

## 2019-01-04 ENCOUNTER — Telehealth: Payer: Self-pay | Admitting: Psychiatry

## 2019-01-04 NOTE — Telephone Encounter (Signed)
Pt would like a refill on her Olanzapine 7.5mg . Please send to [facility] on [address]. Pt made appt for 1/12.

## 2019-02-01 ENCOUNTER — Other Ambulatory Visit: Payer: Self-pay

## 2019-02-01 ENCOUNTER — Encounter: Payer: Self-pay | Admitting: Internal Medicine

## 2019-02-01 ENCOUNTER — Ambulatory Visit (INDEPENDENT_AMBULATORY_CARE_PROVIDER_SITE_OTHER): Payer: Medicare Other | Admitting: Internal Medicine

## 2019-02-01 VITALS — Wt 150.0 lb

## 2019-02-01 DIAGNOSIS — F1020 Alcohol dependence, uncomplicated: Secondary | ICD-10-CM

## 2019-02-01 DIAGNOSIS — I10 Essential (primary) hypertension: Secondary | ICD-10-CM | POA: Diagnosis not present

## 2019-02-01 DIAGNOSIS — F329 Major depressive disorder, single episode, unspecified: Secondary | ICD-10-CM

## 2019-02-01 DIAGNOSIS — F419 Anxiety disorder, unspecified: Secondary | ICD-10-CM | POA: Diagnosis not present

## 2019-02-01 DIAGNOSIS — F32A Depression, unspecified: Secondary | ICD-10-CM

## 2019-02-01 DIAGNOSIS — R630 Anorexia: Secondary | ICD-10-CM

## 2019-02-01 NOTE — Progress Notes (Signed)
Subjective:    Patient ID: Monique Ballard, female    DOB: 1940/09/19, 79 y.o.   MRN: [id number]  DOS:  02/01/2019 Type of visit - description: Virtual Visit via Video Note  I connected with the above patient  by a video enabled telemedicine application and verified that I am speaking with the correct person using two identifiers.   THIS ENCOUNTER IS A VIRTUAL VISIT DUE TO COVID-19 - PATIENT WAS NOT SEEN IN THE OFFICE. PATIENT HAS CONSENTED TO VIRTUAL VISIT / TELEMEDICINE VISIT   Location of patient: home  Location of provider: office  I discussed the limitations of evaluation and management by telemedicine and the availability of in person appointments. The patient expressed understanding and agreed to proceed.   Follow-up Today we discussed his chronic medical problems including anxiety, depression, HTN and EtOH.   Wt Readings from Last 3 Encounters:  02/01/19 150 lb (68 kg)  12/29/18 149 lb 6 oz (67.8 kg)  10/02/18 149 lb (67.6 kg)     Review of Systems Her concern today is lack of appetite, denies weight loss. she simply is not hungry. Denies postprandial nausea, vomiting or abdominal pain She still has some chronic diarrhea, currently controlled with OTCs. Reports that depression is slightly worse than previous months.  No suicidal ideas.   Past Medical History:  Diagnosis Date  . Breast cancer ([place]) fall 1997    Left Lumpectomy, XRT.  treated with Tamoxifem and Evista  . Depression   . Endometriosis in her 20's   surgery to help clear endometriosis to obtain a pregnancy.  Marland Kitchen GERD (gastroesophageal reflux disease)    w/"stretching" remotely  . HTN (hypertension)     Past Surgical History:  Procedure Laterality Date  . BREAST LUMPECTOMY Left fall 1997   Lymph nodes X 3 were negative, ER+, Radiation treatmetn.  Took Tamoxifem X 5 yrs then Evista for 3-5 yrs.  . CESAREAN SECTION     x2  first pregnancy breach and second normal  . PELVIC FRACTURE SURGERY      endometriosis, remotely    Social History   Socioeconomic History  . Marital status: Married    Spouse name: Not on file  . Number of children: 2  . Years of education: Not on file  . Highest education level: Not on file  Occupational History  . Occupation: retired  Tobacco Use  . Smoking status: Former Research scientist (life sciences)  . Smokeless tobacco: Never Used  . Tobacco comment: Quit >30 yrs ago  Substance and Sexual Activity  . Alcohol use: Yes    Comment: 2 glass wine dialy  . Drug use: No  . Sexual activity: Never    Birth control/protection: None  Other Topics Concern  . 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 Difficulty of Paying Living Expenses: Not on file  Food Insecurity:   . Worried About Charity fundraiser in the Last Year: Not on file  . Ran Out of Food in the Last Year: Not on file  Transportation Needs:   . Lack of Transportation (Medical): Not on file  . Lack of Transportation (Non-Medical): Not on  file  Physical Activity:   . Days of Exercise per Week: Not on file  . Minutes of Exercise per Session: Not on file  Stress:   . Feeling of Stress : Not on file  Social Connections:   . Frequency of Communication with Friends and Family: Not on file  . Frequency of Social Gatherings with Friends and Family: Not on file  . Attends Religious Services: Not on file  . Active Member of Clubs or Organizations: Not on file  . Attends Archivist Meetings: Not on file  . Marital Status: Not on file  Intimate Partner Violence:   . Fear of Current or Ex-Partner: Not on file  . Emotionally Abused: Not on file  . Physically Abused: Not on file  . Sexually Abused: Not on file       Allergies as of 02/01/2019      Reactions   Penicillins Rash   Has patient had a PCN reaction causing immediate rash, facial/tongue/throat swelling, SOB or lightheadedness with hypotension: Yes Has patient had a PCN reaction causing severe rash involving mucus membranes or skin necrosis: No Has patient had a PCN reaction that required hospitalization No Has patient had a PCN reaction occurring within the last 10 years: No If all of the above answers are "NO", then may proceed with Cephalosporin use.      Medication List       Accurate as of February 01, 2019  6:11 PM. If you have any questions, ask your nurse or doctor.        ALIGN PO Take by mouth daily.   ALPRAZolam 0.25 MG tablet Commonly known as: XANAX take one to two tablets by mouth twice daily as needed for anxiety   CENTRUM SILVER PO Take 1 each by mouth daily.   escitalopram 20 MG tablet Commonly known as: LEXAPRO Take 1 tablet (20 mg total) by mouth daily.   metoprolol tartrate 50 MG tablet Commonly known as: LOPRESSOR Take 1 tablet (50 mg total) by mouth 2 (two) times daily.   OLANZapine 5 MG tablet Commonly known as: ZYPREXA TAKE ONE TABLET BY MOUTH TWO TO THREE HOURS PRIOR TO BEDTIME   OLANZapine 7.5 MG tablet Commonly known as: ZYPREXA TAKE ONE TABLET BY MOUTH DAILY AT BEDTIME   ondansetron 4 MG disintegrating tablet Commonly known as: ZOFRAN-ODT Take 4 mg by mouth every 8 (eight) hours as needed for nausea or vomiting.   Oscal 500/200 D-3 500-200 MG-UNIT tablet Generic drug: calcium-vitamin D Take 1 tablet by mouth daily.   pantoprazole 40 MG tablet Commonly known as: PROTONIX Take 1 tablet (40 mg total) by mouth daily.           Objective:   Physical Exam Wt 150 lb (68 kg)   LMP 01/22/1992 (Within Months)   BMI 27.44 kg/m  This is  a virtual video visit.  She is alert oriented x3, in no distress.    Assessment     Assessment HTN Depression, Anxiety - Dr Clovis Pu  Tremor dx  [phone] Gait d/o: unsteady onset ~ 2017 GERD with esophageal stricture L Breast cancer, 1997, lumpectomy, XRT, released from oncology ETOH  PLAN: HTN: Stable.  Continue metoprolol, recommend to start checking ambulatory BPs . Goal, SBP less than 135 Depression, anxiety: Worse.  On xanax as needed, Lexapro, Zyprexa.  She feels more depressed compared to previous months, no suicidal ideas, encouraged to keep the appointment with Dr. Clovis Pu tomorrow. Depression still related to issues with her son,  patient is counseled, encouraged to restart formal counseling that previously helped. EtOH: Unchanged.  Still drinks,  "2 or 3 drinks at night". Decreased appetite: Without weight loss or physical symptoms preventing her from eating.  Related to EtOH or depression?Monique Ballard on RTC Diarrhea: Currently controlled on OTCs. Covid vaccination, needs information.  Message sent RTC 4 months    I discussed the assessment and treatment plan with the patient. The patient was provided an opportunity to ask questions and all were answered. The patient agreed with the plan and demonstrated an understanding of the instructions.   The patient was advised to call back or seek an in-person evaluation if the symptoms worsen or if the condition fails to improve as anticipated.

## 2019-02-01 NOTE — Assessment & Plan Note (Signed)
HTN: Stable.  Continue metoprolol, recommend to start checking ambulatory BPs . Goal, SBP less than 135 Depression, anxiety: Worse.  On xanax as needed, Lexapro, Zyprexa.  She feels more depressed compared to previous months, no suicidal ideas, encouraged to keep the appointment with Dr. Clovis Pu tomorrow. Depression still related to issues with her son, patient is counseled, encouraged to restart formal counseling that previously helped. EtOH: Unchanged.  Still drinks,  "2 or 3 drinks at night". Decreased appetite: Without weight loss or physical symptoms preventing her from eating.  Related to EtOH or depression?Monique Ballard on RTC Diarrhea: Currently controlled on OTCs. Covid vaccination, needs information.  Message sent RTC 4 months

## 2019-02-02 ENCOUNTER — Ambulatory Visit (INDEPENDENT_AMBULATORY_CARE_PROVIDER_SITE_OTHER): Payer: Medicare Other | Admitting: Psychiatry

## 2019-02-02 ENCOUNTER — Encounter: Payer: Self-pay | Admitting: Psychiatry

## 2019-02-02 DIAGNOSIS — F1010 Alcohol abuse, uncomplicated: Secondary | ICD-10-CM

## 2019-02-02 DIAGNOSIS — F331 Major depressive disorder, recurrent, moderate: Secondary | ICD-10-CM

## 2019-02-02 DIAGNOSIS — F5105 Insomnia due to other mental disorder: Secondary | ICD-10-CM

## 2019-02-02 DIAGNOSIS — F4001 Agoraphobia with panic disorder: Secondary | ICD-10-CM

## 2019-02-02 DIAGNOSIS — F411 Generalized anxiety disorder: Secondary | ICD-10-CM

## 2019-02-02 MED ORDER — OLANZAPINE 7.5 MG PO TABS: 7.5000 mg | ORAL_TABLET | Freq: Every day | ORAL | 0 refills | Status: DC

## 2019-02-02 MED ORDER — ESCITALOPRAM OXALATE 20 MG PO TABS: 20.0000 mg | ORAL_TABLET | Freq: Every day | ORAL | 1 refills | Status: DC

## 2019-02-02 NOTE — Progress Notes (Signed)
Monique Ballard [id number] 09-10-1940 79 y.o.   Virtual Visit via [place]  I connected with pt by WebEx and verified that I am speaking with the correct person using two identifiers.   I discussed the limitations, risks, security and privacy concerns of performing an evaluation and management service by Monique Ballard and the availability of in person appointments. I also discussed with the patient that there may be a patient responsible charge related to this service. The patient expressed understanding and agreed to proceed.  I discussed the assessment and treatment plan with the patient. The patient was provided an opportunity to ask questions and all were answered. The patient agreed with the plan and demonstrated an understanding of the instructions.   The patient was advised to call back or seek an in-person evaluation if the symptoms worsen or if the condition fails to improve as anticipated.  I provided 30 minutes of video time during this encounter. The call started at 245 and ended at 63. The patient was located at home and the provider was located office.    Subjective:   Patient ID:  Monique Ballard is a 79 y.o. (DOB 10-Oct-1940) female.  Chief Complaint:  Chief Complaint  Patient presents with  . Follow-up     Medication Management   . Anxiety     Medication Management  . Depression     Medication Management  . Medication Refill    Olanzapine    Anxiety Symptoms include nervous/anxious behavior. Patient reports no confusion or suicidal ideas.    Depression        Associated symptoms include appetite change.  Associated symptoms include no suicidal ideas.  Past medical history includes anxiety.   Medication Refill Pertinent negatives include no weakness.   Monique Ballard presents to the office today for follow-up of depression and anxiety.  1st seen  February 26.  Added Abilify to Lexapro 30.  Stomach ache.  So stopped  And we switched to olanzapine 5 mg and "my mood  is much better".    visit April 14 because of persistent problems with eating and residual problems with depression and anxiety the dose of olanzapine was increased to 7.5 mg nightly.  She continued on Lexapro 20 mg daily.  Last visit Jun 16, 2018.  For treatment resistant anxiety and depression olanzapine was increased to 7.5 mg each evening.  She had failed to make the change recommended at the previous visit.  She was supposed to follow-up in 6 weeks but it is been over 6 months.  Helped to increase olanzapine and then ran out lately bc not keeping appt.  Helped her sleep.  Hard for her to remeember  But it did help.  No SE problems with it.  Monique Ballard wanted her to tell me of her poor appetite.  Lost 5#.   No alcohol daytime then 2-3 at night to help her sleep.   Pt reports that mood is Anxious and Depressed and describes both  as Moderate. Anxiety symptoms include: Excessive Worry, still mainly about her son. H and she disagree with how to deal with son living with them. Son does help at home and is working but not at sustaining job.  Pt reports no sleep issues and improved with olanzapine. Pt reports that appetite is worse again. Pt reports that energy is down significantly and poor and loss of interest or pleasure in usual activities forces herself to do things and poor motivation. Concentration is good. Suicidal thoughts:  denied by patient.  Sleeping better 6-8 hours.  Made quite a difference. Weight maintaining but overall lost 10# to 150# from 160#.  Anxiety is bearable and before it was not.  I still feel like I need help. Not panic.  Counsellor.  Past Psychiatric Medication Trials: Abilify side effects, Lexapro 20, olanzapine 7.5, Xanax 0.5, clonazepam, Wellbutrin 300 helped her stop smoking but later triggered panic, citalopram 40, duloxetine 60  Review of Systems:  Review of Systems  Constitutional: Positive for appetite change.  Neurological: Negative for tremors and  weakness.  Psychiatric/Behavioral: Positive for depression and dysphoric mood. Negative for agitation, behavioral problems, confusion, hallucinations, self-injury, sleep disturbance and suicidal ideas. The patient is nervous/anxious. The patient is not hyperactive.     Medications: I have reviewed the patient's current medications.  Current Outpatient Medications  Medication Sig Dispense Refill  . ALPRAZolam (XANAX) 0.25 MG tablet take one to two tablets by mouth twice daily as needed for anxiety 60 tablet 2  . calcium-vitamin D (OSCAL 500/200 D-3) 500-200 MG-UNIT per tablet Take 1 tablet by mouth daily.      Marland Kitchen escitalopram (LEXAPRO) 20 MG tablet Take 1 tablet (20 mg total) by mouth daily. 90 tablet 1  . metoprolol tartrate (LOPRESSOR) 50 MG tablet Take 1 tablet (50 mg total) by mouth 2 (two) times daily. 180 tablet 1  . Multiple Vitamins-Minerals (CENTRUM SILVER PO) Take 1 each by mouth daily.      . ondansetron (ZOFRAN-ODT) 4 MG disintegrating tablet Take 4 mg by mouth every 8 (eight) hours as needed for nausea or vomiting.    . pantoprazole (PROTONIX) 40 MG tablet Take 1 tablet (40 mg total) by mouth daily. 90 tablet 3  . Probiotic Product (ALIGN PO) Take by mouth daily.    Marland Kitchen OLANZapine (ZYPREXA) 7.5 MG tablet TAKE ONE TABLET BY MOUTH DAILY AT BEDTIME  (Patient not taking: Reported on 02/02/2019) 10 tablet 0   No current facility-administered medications for this visit.    Medication Side Effects: None  Allergies:  Allergies  Allergen Reactions  . Penicillins Rash    Has patient had a PCN reaction causing immediate rash, facial/tongue/throat swelling, SOB or lightheadedness with hypotension: Yes Has patient had a PCN reaction causing severe rash involving mucus membranes or skin necrosis: No Has patient had a PCN reaction that required hospitalization No Has patient had a PCN reaction occurring within the last 10 years: No If all of the above answers are "NO", then may proceed with  Cephalosporin use.     Past Medical History:  Diagnosis Date  . Breast cancer ([place]) fall 1997    Left Lumpectomy, XRT.  treated with Tamoxifem and Evista  . Depression   . Endometriosis in her 20's   surgery to help clear endometriosis to obtain a pregnancy.  Marland Kitchen GERD (gastroesophageal reflux disease)    w/"stretching" remotely  . HTN (hypertension)     Family History  Problem Relation Age of Onset  . Hypertension Mother   . Stroke Mother   . Heart attack Father        elderly  . Heart disease Father        CHF  . Throat cancer Other   . Breast cancer Neg Hx   . Colon cancer Neg Hx   . Diabetes Neg Hx     Social History   Socioeconomic History  . Marital status: Married    Spouse name: Not on file  . Number of children: 2  .  Years of education: Not on file  . Highest education level: Not on file  Occupational History  . Occupation: retired  Tobacco Use  . Smoking status: Former Research scientist (life sciences)  . Smokeless tobacco: Never Used  . Tobacco comment: Quit >30 yrs ago  Substance and Sexual Activity  . Alcohol use: Yes    Comment: 2 glass wine dialy  . Drug use: No  . Sexual activity: Never    Birth control/protection: None  Other Topics Concern  . 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 Difficulty of Paying Living Expenses: Not on file  Food Insecurity:   . Worried About Charity fundraiser in the Last Year: Not on file  . Ran Out of Food in the Last Year: Not on file  Transportation Needs:   . Lack of Transportation (Medical): Not on file  . Lack of Transportation (Non-Medical): Not on file  Physical Activity:   . Days of Exercise per Week: Not on file  . Minutes of Exercise  per Session: Not on file  Stress:   . Feeling of Stress : Not on file  Social Connections:   . Frequency of Communication with Friends and Family: Not on file  . Frequency of Social Gatherings with Friends and Family: Not on file  . Attends Religious Services: Not on file  . Active Member of Clubs or Organizations: Not on file  . Attends Archivist Meetings: Not on file  . Marital Status: Not on file  Intimate Partner Violence:   . Fear of Current or Ex-Partner: Not on file  . Emotionally Abused: Not on file  . Physically Abused: Not on file  . Sexually Abused: Not on file    Past Medical History, Surgical history, Social history, and Family history were reviewed and updated as appropriate.   Please see review of systems for further details on the patient's review from today.   Objective:   Physical Exam:  LMP 01/22/1992 (Within Months)   Physical Exam Neurological:     Mental Status: She is alert and oriented to person, place, and time.     Cranial Nerves: No dysarthria.  Psychiatric:        Attention and Perception: Attention normal. She does not perceive auditory hallucinations.        Mood and Affect: Mood is anxious and depressed.        Speech: Speech normal.        Behavior: Behavior is cooperative.        Thought Content: Thought content normal. Thought content is not paranoid or delusional. Thought content does not include homicidal or suicidal ideation. Thought content does not include homicidal or suicidal plan.        Cognition and Memory: Cognition and memory normal.     Comments: Insight fair.  The anxiety is greater in severity than the depression.  It was improved on the olanzapine is gotten worse again off olanzapine. Fair judgment     Lab Review:     Component Value Date/Time   NA 136 05/12/2018 1443   K 3.8 05/12/2018 1443   CL 102 05/12/2018 1329  CO2 22 05/12/2018 1329   GLUCOSE 118 (H) 05/12/2018 1329   BUN 8 05/12/2018 1329    CREATININE 0.64 05/12/2018 1329   CREATININE 0.79 12/22/2015 1503   CALCIUM 8.6 (L) 05/12/2018 1329   PROT 6.4 (L) 05/12/2018 1329   ALBUMIN 2.8 (L) 05/12/2018 1329   AST 78 (H) 05/12/2018 1329   ALT 39 05/12/2018 1329   ALKPHOS 148 (H) 05/12/2018 1329   BILITOT 1.9 (H) 05/12/2018 1329   GFRNONAA >60 05/12/2018 1329   GFRAA >60 05/12/2018 1329       Component Value Date/Time   WBC 8.6 10/02/2018 1333   RBC 4.76 10/02/2018 1333   HGB 16.3 (H) 10/02/2018 1333   HCT 48.8 (H) 10/02/2018 1333   PLT 247.0 10/02/2018 1333   MCV 102.5 (H) 10/02/2018 1333   MCH 33.1 05/12/2018 1329   MCHC 33.3 10/02/2018 1333   RDW 15.3 10/02/2018 1333   LYMPHSABS 1.9 10/02/2018 1333   MONOABS 1.7 (H) 10/02/2018 1333   EOSABS 0.1 10/02/2018 1333   BASOSABS 0.1 10/02/2018 1333    No results found for: POCLITH, LITHIUM   No results found for: PHENYTOIN, PHENOBARB, VALPROATE, CBMZ   .res Assessment: Plan:    Generalized anxiety disorder  Major depressive disorder, recurrent episode, moderate (HCC)  Panic disorder with agoraphobia  Insomnia due to mental condition  Excessive drinking alcohol   She has had a severe and prolonged depression and anxiety episode lasting since March 2019 triggered by behavior of her son who embezzled money and got fired from a job.  She has not been overpull to get over her disappointment about him and now he is living with them.  She has had a partial response with the addition of olanzapine.  She did not tolerate Abilify.  After increasing the olanzapine to 7.5 mg she apparently did very well had a good response.  Her appetite and sleep were improved.  She did not keep appointments and then ran out of the medication and has had some relapse of anxiety and insomnia and poor appetite.  She would like to return to that medication at that dose which she tolerated well.  Eat enough protein or else meds won't work.  Drinks protein shakes.    Option increase olanzapine,  mirtazapine.  She lost 5 pounds off olanzapine apparently.  She has had no side effects.  Therefore, for treatment resistant anxiety and depression restart olanzapine to 7.5mg  in evening. (She was mistakenly taking only 5mg  a day).    Discussed potential metabolic side effects associated with atypical antipsychotics, as well as potential risk for movement side effects. Advised pt to contact office if movement side effects occur.   If no improvement further after 2-3 weeks then call and consider mirtazapine or increasing the olanzapine further.  Continue Lexapro 20 mg  She is drinking probably more than she admits to and having had problems with depression and GI problems I agree with Monique Ballard that she should stop the alcohol.  She feels she needs it for sleep.  We discussed alcohol abuse problems and the risk of it compromising improvement with medications as well as med medical risks associated.  The olanzapine should improve her anxiety and improve her sleep and appetite making it easier to stop the alcohol.  If she is unable to stop the alcohol she should contact our office back.  She agrees to do so.  Discussed her noncompliance with recommendations about follow-up.  She states she did not realize she was  supposed to follow-up.  This was emphasized and if she does not have the improvement expected she should call and let us know.  FU 6 mos  Lynder Parents, MD, DFAPA   Please see After Visit Summary for patient specific instructions.  Future Appointments  Date Time Provider [place]  04/26/2019  2:00 PM Dennis Bast, RN [facility]    No orders of the defined types were placed in this encounter.     -------------------------------

## 2019-03-08 ENCOUNTER — Other Ambulatory Visit: Payer: Self-pay | Admitting: Internal Medicine

## 2019-03-15 ENCOUNTER — Telehealth: Payer: Self-pay

## 2019-03-15 NOTE — Telephone Encounter (Signed)
Handicap Placard completed and mailed to Pt. Copy of form sent for scanning.

## 2019-03-22 ENCOUNTER — Ambulatory Visit: Payer: Medicare Other | Attending: Internal Medicine

## 2019-03-22 DIAGNOSIS — Z23 Encounter for immunization: Secondary | ICD-10-CM | POA: Insufficient documentation

## 2019-03-22 NOTE — Progress Notes (Signed)
   Covid-19 Vaccination Clinic  Name:  Monique Ballard    MRN: [id number] DOB: 04-27-1940  03/22/2019  Monique Ballard was observed post Covid-19 immunization for 15 minutes without incidence. She was provided with Vaccine Information Sheet and instruction to access the V-Safe system.   Monique Ballard was instructed to call 911 with any severe reactions post vaccine: Marland Kitchen Difficulty breathing  . Swelling of your face and throat  . A fast heartbeat  . A bad rash all over your body  . Dizziness and weakness    Immunizations Administered    Name Date Dose VIS Date Route   Pfizer COVID-19 Vaccine 03/22/2019 11:42 AM 0.3 mL 01/01/2019 Intramuscular   Manufacturer: [place]   Lot: [id number]   [place]: [id number]

## 2019-04-07 ENCOUNTER — Other Ambulatory Visit: Payer: Self-pay | Admitting: Internal Medicine

## 2019-04-07 NOTE — Telephone Encounter (Signed)
Unable to review PDMP, prescription sent

## 2019-04-07 NOTE — Telephone Encounter (Signed)
Alprazolam refill.   Last OV: 02/01/2019 Last Fill: 11/24/2018 #60 and 2RF Pt sig: 1-2 tabs bid prn UDS: 04/21/2017 Low risk  Needs UDS at next OV.

## 2019-04-20 ENCOUNTER — Ambulatory Visit: Payer: Medicare Other | Attending: Internal Medicine

## 2019-04-20 DIAGNOSIS — Z23 Encounter for immunization: Secondary | ICD-10-CM

## 2019-04-20 NOTE — Progress Notes (Signed)
   Covid-19 Vaccination Clinic  Name:  Monique Ballard    MRN: [id number] DOB: 1940-02-15  04/20/2019  Monique Ballard was observed post Covid-19 immunization for 15 minutes without incident. She was provided with Vaccine Information Sheet and instruction to access the V-Safe system.   Monique Ballard was instructed to call 911 with any severe reactions post vaccine: Marland Kitchen Difficulty breathing  . Swelling of face and throat  . A fast heartbeat  . A bad rash all over body  . Dizziness and weakness   Immunizations Administered    Name Date Dose VIS Date Route   Pfizer COVID-19 Vaccine 04/20/2019 11:58 AM 0.3 mL 01/01/2019 Intramuscular   Manufacturer: [place]   Lot: [id number]   [place]: [id number]

## 2019-04-22 NOTE — Progress Notes (Signed)
Nurse connected with patient 04/26/19 at  2:30 PM EDT by a telephone enabled telemedicine application and verified that I am speaking with the correct person using two identifiers. Patient stated full name and DOB. Patient gave permission to continue with virtual visit. Patient's location was at home and Nurse's location was at [place] office.   Subjective:   Monique Ballard is a 79 y.o. female who presents for Medicare Annual (Subsequent) preventive examination.  Review of Systems:   Home Safety/Smoke Alarms: Feels safe in home. Smoke alarms in place.  Lives in 1 story home with husband and adult son. 1 small dog.  Uses cane occasionally if she goes out.   Female:        Mammo- 04/08/18 . ordered  Dexa scan- 05/08/16    ordered   CCS- next due 05/2023    Objective:     Vitals: Unable to assess. This visit is enabled though telemedicine due to Covid 19.   Advanced Directives 04/26/2019 06/10/2018 05/12/2018 04/23/2018 04/29/2017 10/01/2014  Does Patient Have a Medical Advance Directive? No No No No No No  Would patient like information on creating a medical advance directive? No - Patient declined - No - Guardian declined No - Patient declined Yes (MAU/Ambulatory/Procedural Areas - Information given) No - patient declined information    Tobacco Social History   Tobacco Use  Smoking Status Former Smoker  Smokeless Tobacco Never Used  Tobacco Comment   Quit >30 yrs ago     Counseling given: Not Answered Comment: Quit >30 yrs ago   Clinical Intake: Pain : No/denies pain     Past Medical History:  Diagnosis Date  . Breast cancer ([place]) fall 1997    Left Lumpectomy, XRT.  treated with Tamoxifem and Evista  . Depression   . Endometriosis in her 20's   surgery to help clear endometriosis to obtain a pregnancy.  Marland Kitchen GERD (gastroesophageal reflux disease)    w/"stretching" remotely  . HTN (hypertension)    Past Surgical History:  Procedure Laterality Date  . BREAST LUMPECTOMY  Left fall 1997   Lymph nodes X 3 were negative, ER+, Radiation treatmetn.  Took Tamoxifem X 5 yrs then Evista for 3-5 yrs.  . CESAREAN SECTION     x2  first pregnancy breach and second normal  . PELVIC FRACTURE SURGERY     endometriosis, remotely   Family History  Problem Relation Age of Onset  . Hypertension Mother   . Stroke Mother   . Heart attack Father        elderly  . Heart disease Father        CHF  . Throat cancer Other   . Breast cancer Neg Hx   . Colon cancer Neg Hx   . Diabetes Neg Hx    Social History   Socioeconomic History  . Marital status: Married    Spouse name: Not on file  . Number of children: 2  . Years of education: Not on file  . Highest education level: Not on file  Occupational History  . Occupation: retired  Tobacco Use  . Smoking status: Former Research scientist (life sciences)  . Smokeless tobacco: Never Used  . Tobacco comment: Quit >30 yrs ago  Substance and Sexual Activity  . Alcohol use: Yes    Comment: 2 glass wine dialy  . Drug use: No  . Sexual activity: Never    Birth control/protection: None  Other Topics Concern  . 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 Difficulty of Paying Living Expenses: Not hard at all  Food Insecurity: No Food Insecurity  . Worried About Charity fundraiser in the Last Year: Never true  . Ran Out of Food in the Last Year: Never true  Transportation Needs: No Transportation Needs  . Lack of Transportation (Medical): No  . Lack of Transportation (Non-Medical): No  Physical Activity:   . Days of Exercise per Week:   . Minutes of Exercise per Session:   Stress:   . Feeling of Stress :   Social Connections:   . Frequency of  Communication with Friends and Family:   . Frequency of Social Gatherings with Friends and Family:   . Attends Religious Services:   . Active Member of Clubs or Organizations:   . Attends Archivist Meetings:   Marland Kitchen Marital Status:     Outpatient Encounter Medications as of 04/26/2019  Medication Sig  . ALPRAZolam (XANAX) 0.25 MG tablet take 1 to 2 tablets by mouth twice a day as needed for anxiety   . calcium-vitamin D (OSCAL 500/200 D-3) 500-200 MG-UNIT per tablet Take 1 tablet by mouth daily.    Marland Kitchen escitalopram (LEXAPRO) 20 MG tablet Take 1 tablet (20 mg total) by mouth daily.  . metoprolol tartrate (LOPRESSOR) 50 MG tablet Take 1 tablet (50 mg total) by mouth 2 (two) times daily.  . Multiple Vitamins-Minerals (CENTRUM SILVER PO) Take 1 each by mouth daily.    Marland Kitchen OLANZapine (ZYPREXA) 7.5 MG tablet Take 1 tablet (7.5 mg total) by mouth at bedtime.  . ondansetron (ZOFRAN-ODT) 4 MG disintegrating tablet Take 4 mg by mouth every 8 (eight) hours as needed for nausea or vomiting.  . pantoprazole (PROTONIX) 40 MG tablet Take 1 tablet (40 mg total) by mouth daily.  . Probiotic Product (ALIGN PO) Take by mouth daily.   No facility-administered encounter medications on file as of 04/26/2019.    Activities of Daily Living In your present state of health, do you have any difficulty performing the following activities: 04/26/2019  Hearing? N  Vision? N  Difficulty concentrating or making decisions? N  Walking or climbing stairs? Y  Dressing or bathing? N  Doing errands, shopping? N  Preparing Food and eating ? N  Using the Toilet? N  In the past six months, have you accidently leaked urine? N  Do you have problems with loss of bowel control? N  Managing your Medications? N  Managing your Finances? N  Housekeeping or managing your Housekeeping? N  Some recent data might be hidden    Patient Care Team: Colon Branch, MD as PCP - Windle Guard, MD as Consulting Physician  (Gastroenterology) Kem Boroughs, FNP as Nurse Practitioner (Family Medicine)    Assessment:   This is a routine wellness examination for Monique Ballard. Physical assessment deferred to PCP.  Exercise Activities and Dietary recommendations Current Exercise Habits: Home exercise routine, Type of exercise: Other - see comments(PT exercises.), Time (Minutes): 10, Frequency (Times/Week): 7, Weekly Exercise (Minutes/Week): 70, Intensity: Mild, Exercise limited by: None identified Diet (meal preparation, eat out, water intake, caffeinated beverages, dairy products, fruits and vegetables):  well balanced   Goals    . Continue PT balance exercises to prevent more falls. (pt-stated)       Fall Risk Fall Risk  04/26/2019 04/23/2018 04/21/2017 03/10/2017 04/28/2015  Falls in the past year? 0 1 Yes No No  Number falls in past yr: 0 1 2 or more - -  Injury with Fall? 0 1 No - -  Risk Factor Category  - - High Fall Risk - -  Risk for fall due to : - Impaired balance/gait - - -  Follow up Education provided;Falls prevention discussed - Falls evaluation completed;Education provided - -   Depression Screen PHQ 2/9 Scores 04/26/2019 04/23/2018 01/01/2018 04/21/2017  PHQ - 2 Score 1 1 6  -  PHQ- 9 Score - - 15 -  Exception Documentation - - - Other- indicate reason in comment box     Cognitive Function Ad8 score reviewed for issues:  Issues making decisions:no  Less interest in hobbies / activities:no  Repeats questions, stories (family complaining):no  Trouble using ordinary gadgets (microwave, computer, phone):no  Forgets the month or year: no  Mismanaging finances: no  Remembering appts:no  Daily problems with thinking and/or memory:no Ad8 score is=0         Immunization History  Administered Date(s) Administered  . Fluad Quad(high Dose 65+) 10/02/2018  . Influenza Split 12/08/2013  . Influenza Whole 11/16/2009  . Influenza, High Dose Seasonal PF 10/14/2014, 12/22/2015, 11/25/2016, 01/01/2018  .  PFIZER SARS-COV-2 Vaccination 03/22/2019, 04/20/2019  . Pneumococcal Conjugate-13 08/10/2014  . Pneumococcal Polysaccharide-23 08/28/2009  . Td 02/07/1999, 08/24/2008  . Tdap 10/01/2014, 06/10/2018  . Zoster 08/28/2009   Screening Tests Health Maintenance  Topic Date Due  . MAMMOGRAM  04/07/2019  . INFLUENZA VACCINE  08/22/2019  . TETANUS/TDAP  06/09/2028  . DEXA SCAN  Completed  . PNA vac Low Risk Adult  Completed      Plan:    Please schedule your next medicare wellness visit with me in 1 yr.  Continue to eat heart healthy diet (full of fruits, vegetables, whole grains, lean protein, water--limit salt, fat, and sugar intake) and increase physical activity as tolerated.  Continue doing brain stimulating activities (puzzles, reading, adult coloring books, staying active) to keep memory sharp.   I have ordered your mammogram and bone density scan.   I have personally reviewed and noted the following in the patient's chart:   . Medical and social history . Use of alcohol, tobacco or illicit drugs  . Current medications and supplements . Functional ability and status . Nutritional status . Physical activity . Advanced directives . List of other physicians . Hospitalizations, surgeries, and ER visits in previous 12 months . Vitals . Screenings to include cognitive, depression, and falls . Referrals and appointments  In addition, I have reviewed and discussed with patient certain preventive protocols, quality metrics, and best practice recommendations. A written personalized care plan for preventive services as well as general preventive health recommendations were provided to patient.     Shela Nevin, [place]  04/26/2019

## 2019-04-25 ENCOUNTER — Encounter: Payer: Self-pay | Admitting: Internal Medicine

## 2019-04-26 ENCOUNTER — Encounter: Payer: Self-pay | Admitting: *Deleted

## 2019-04-26 ENCOUNTER — Ambulatory Visit (INDEPENDENT_AMBULATORY_CARE_PROVIDER_SITE_OTHER): Payer: Medicare Other | Admitting: *Deleted

## 2019-04-26 DIAGNOSIS — Z0000 Encounter for general adult medical examination without abnormal findings: Secondary | ICD-10-CM | POA: Diagnosis not present

## 2019-04-26 DIAGNOSIS — Z1231 Encounter for screening mammogram for malignant neoplasm of breast: Secondary | ICD-10-CM

## 2019-04-26 DIAGNOSIS — Z780 Asymptomatic menopausal state: Secondary | ICD-10-CM

## 2019-04-26 NOTE — Patient Instructions (Signed)
Please schedule your next medicare wellness visit with me in 1 yr.  Continue to eat heart healthy diet (full of fruits, vegetables, whole grains, lean protein, water--limit salt, fat, and sugar intake) and increase physical activity as tolerated.  Continue doing brain stimulating activities (puzzles, reading, adult coloring books, staying active) to keep memory sharp.   I have ordered your mammogram and bone density scan.    Monique Ballard , Thank you for taking time to come for your Medicare Wellness Visit. I appreciate your ongoing commitment to your health goals. Please review the following plan we discussed and let me know if I can assist you in the future.   These are the goals we discussed: Goals    . Continue PT balance exercises to prevent more falls. (pt-stated)       This is a list of the screening recommended for you and due dates:  Health Maintenance  Topic Date Due  . Mammogram  04/07/2019  . Flu Shot  08/22/2019  . Tetanus Vaccine  06/09/2028  . DEXA scan (bone density measurement)  Completed  . Pneumonia vaccines  Completed    Preventive Care 33 Years and Older, Female Preventive care refers to lifestyle choices and visits with your health care provider that can promote health and wellness. This includes:  A yearly physical exam. This is also called an annual well check.  Regular dental and eye exams.  Immunizations.  Screening for certain conditions.  Healthy lifestyle choices, such as diet and exercise. What can I expect for my preventive care visit? Physical exam Your health care provider will check:  Height and weight. These may be used to calculate body mass index (BMI), which is a measurement that tells if you are at a healthy weight.  Heart rate and blood pressure.  Your skin for abnormal spots. Counseling Your health care provider may ask you questions about:  Alcohol, tobacco, and drug use.  Emotional well-being.  Home and relationship  well-being.  Sexual activity.  Eating habits.  History of falls.  Memory and ability to understand (cognition).  Work and work Statistician.  Pregnancy and menstrual history. What immunizations do I need?  Influenza (flu) vaccine  This is recommended every year. Tetanus, diphtheria, and pertussis (Tdap) vaccine  You may need a Td booster every 10 years. Varicella (chickenpox) vaccine  You may need this vaccine if you have not already been vaccinated. Zoster (shingles) vaccine  You may need this after age 78. Pneumococcal conjugate (PCV13) vaccine  One dose is recommended after age 37. Pneumococcal polysaccharide (PPSV23) vaccine  One dose is recommended after age 74. Measles, mumps, and rubella (MMR) vaccine  You may need at least one dose of MMR if you were born in 1957 or later. You may also need a second dose. Meningococcal conjugate (MenACWY) vaccine  You may need this if you have certain conditions. Hepatitis A vaccine  You may need this if you have certain conditions or if you travel or work in places where you may be exposed to hepatitis A. Hepatitis B vaccine  You may need this if you have certain conditions or if you travel or work in places where you may be exposed to hepatitis B. Haemophilus influenzae type b (Hib) vaccine  You may need this if you have certain conditions. You may receive vaccines as individual doses or as more than one vaccine together in one shot (combination vaccines). Talk with your health care provider about the risks and benefits of combination  vaccines. What tests do I need? Blood tests  Lipid and cholesterol levels. These may be checked every 5 years, or more frequently depending on your overall health.  Hepatitis C test.  Hepatitis B test. Screening  Lung cancer screening. You may have this screening every year starting at age 4 if you have a 30-pack-year history of smoking and currently smoke or have quit within the past  15 years.  Colorectal cancer screening. All adults should have this screening starting at age 47 and continuing until age 72. Your health care provider may recommend screening at age 70 if you are at increased risk. You will have tests every 1-10 years, depending on your results and the type of screening test.  Diabetes screening. This is done by checking your blood sugar (glucose) after you have not eaten for a while (fasting). You may have this done every 1-3 years.  Mammogram. This may be done every 1-2 years. Talk with your health care provider about how often you should have regular mammograms.  BRCA-related cancer screening. This may be done if you have a family history of breast, ovarian, tubal, or peritoneal cancers. Other tests  Sexually transmitted disease (STD) testing.  Bone density scan. This is done to screen for osteoporosis. You may have this done starting at age 34. Follow these instructions at home: Eating and drinking  Eat a diet that includes fresh fruits and vegetables, whole grains, lean protein, and low-fat dairy products. Limit your intake of foods with high amounts of sugar, saturated fats, and salt.  Take vitamin and mineral supplements as recommended by your health care provider.  Do not drink alcohol if your health care provider tells you not to drink.  If you drink alcohol: ? Limit how much you have to 0-1 drink a day. ? Be aware of how much alcohol is in your drink. In the [place] one drink equals one 12 oz bottle of beer (355 mL), one 5 oz glass of wine (148 mL), or one 1 oz glass of hard liquor (44 mL). Lifestyle  Take daily care of your teeth and gums.  Stay active. Exercise for at least 30 minutes on 5 or more days each week.  Do not use any products that contain nicotine or tobacco, such as cigarettes, e-cigarettes, and chewing tobacco. If you need help quitting, ask your health care provider.  If you are sexually active, practice safe sex. Use a  condom or other form of protection in order to prevent STIs (sexually transmitted infections).  Talk with your health care provider about taking a low-dose aspirin or statin. What's next?  Go to your health care provider once a year for a well check visit.  Ask your health care provider how often you should have your eyes and teeth checked.  Stay up to date on all vaccines. This information is not intended to replace advice given to you by your health care provider. Make sure you discuss any questions you have with your health care provider. Document Revised: 01/01/2018 Document Reviewed: 01/01/2018 Elsevier Patient Education  2020 [employer].

## 2019-05-23 ENCOUNTER — Other Ambulatory Visit: Payer: Self-pay | Admitting: Psychiatry

## 2019-05-23 DIAGNOSIS — F5105 Insomnia due to other mental disorder: Secondary | ICD-10-CM

## 2019-05-23 DIAGNOSIS — F331 Major depressive disorder, recurrent, moderate: Secondary | ICD-10-CM

## 2019-05-23 DIAGNOSIS — F411 Generalized anxiety disorder: Secondary | ICD-10-CM

## 2019-06-04 ENCOUNTER — Encounter: Payer: Medicare Other | Admitting: Internal Medicine

## 2019-07-07 ENCOUNTER — Other Ambulatory Visit: Payer: Self-pay

## 2019-07-07 ENCOUNTER — Ambulatory Visit
Admission: RE | Admit: 2019-07-07 | Discharge: 2019-07-07 | Disposition: A | Discharge status: Home / Self Care | Payer: Medicare Other | Source: Ambulatory Visit | Attending: Internal Medicine | Admitting: Internal Medicine

## 2019-07-07 DIAGNOSIS — Z1231 Encounter for screening mammogram for malignant neoplasm of breast: Secondary | ICD-10-CM | POA: Diagnosis not present

## 2019-07-07 DIAGNOSIS — Z780 Asymptomatic menopausal state: Secondary | ICD-10-CM | POA: Diagnosis not present

## 2019-07-07 DIAGNOSIS — M8588 Other specified disorders of bone density and structure, other site: Secondary | ICD-10-CM | POA: Diagnosis not present

## 2019-07-07 IMAGING — MG DIGITAL SCREENING BILAT W/ TOMO W/ CAD
6 of 12 series · 6 of 36 positions shown · non-contrast
Comparison: Previous exam(s).

CLINICAL DATA: Screening.

EXAM:
DIGITAL SCREENING BILATERAL MAMMOGRAM WITH TOMO AND CAD

[L CC synth-2D]
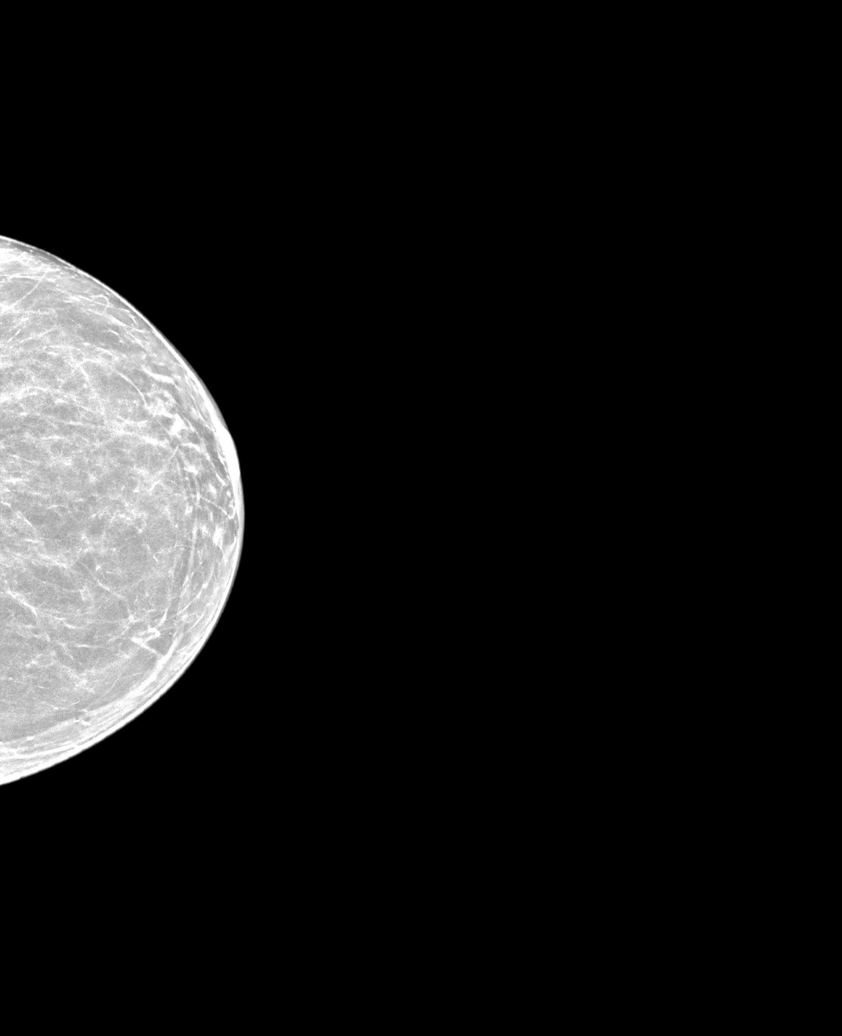

[R CC synth-2D]
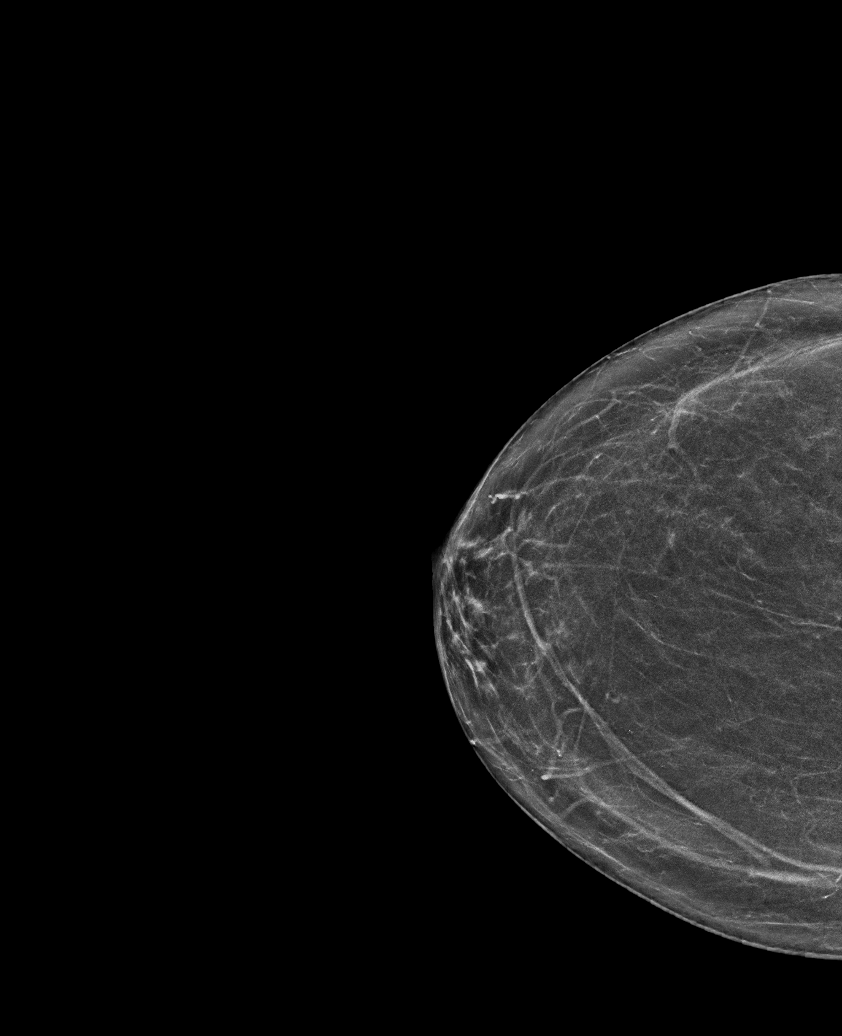

[L XCCL synth-2D]
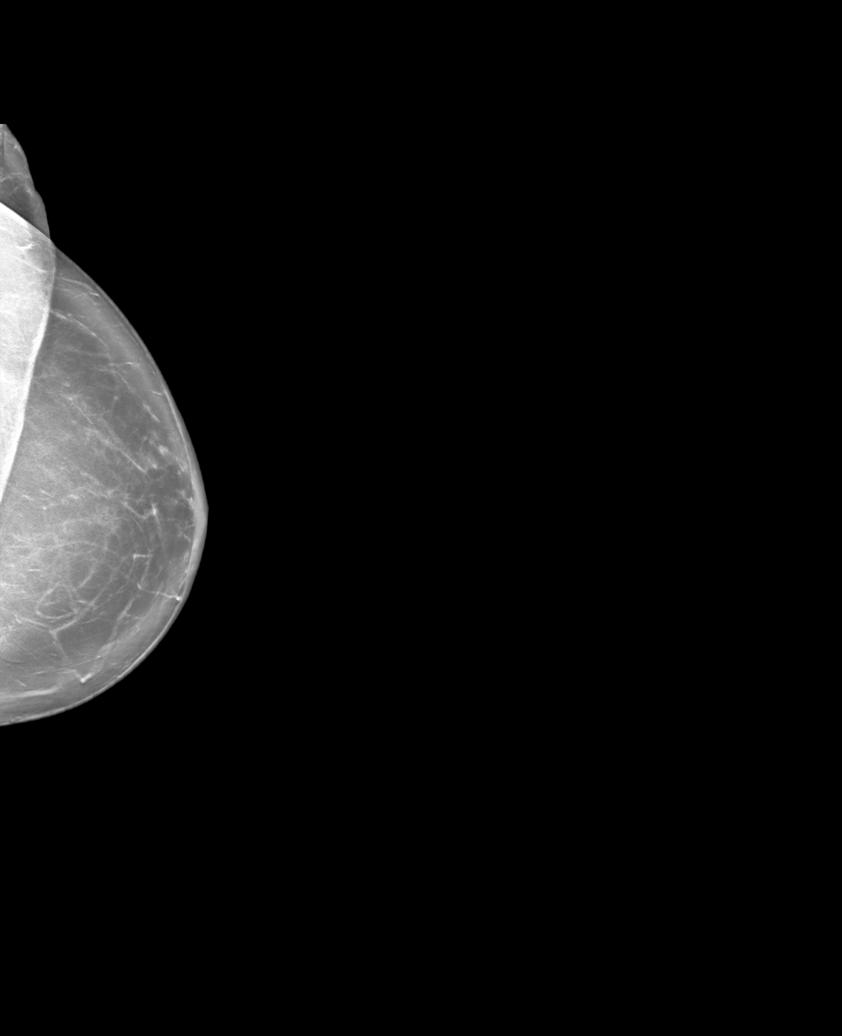

[L MLO synth-2D (1 of 2)]
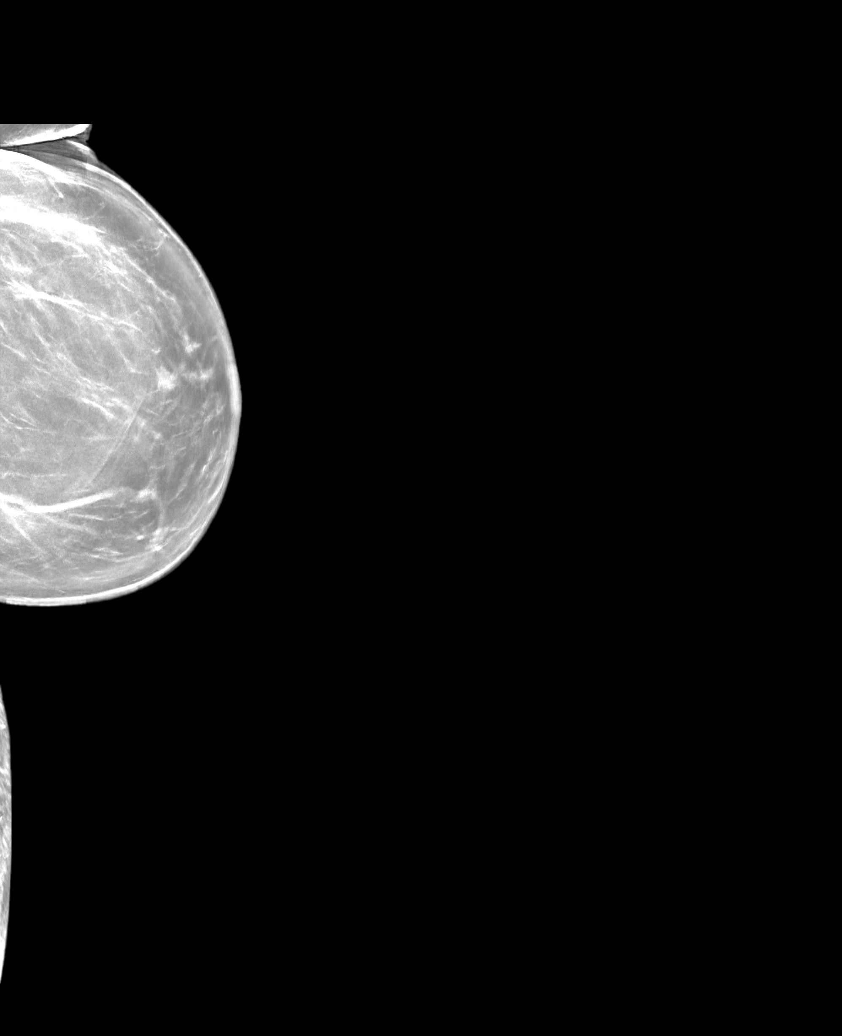

[R MLO synth-2D]
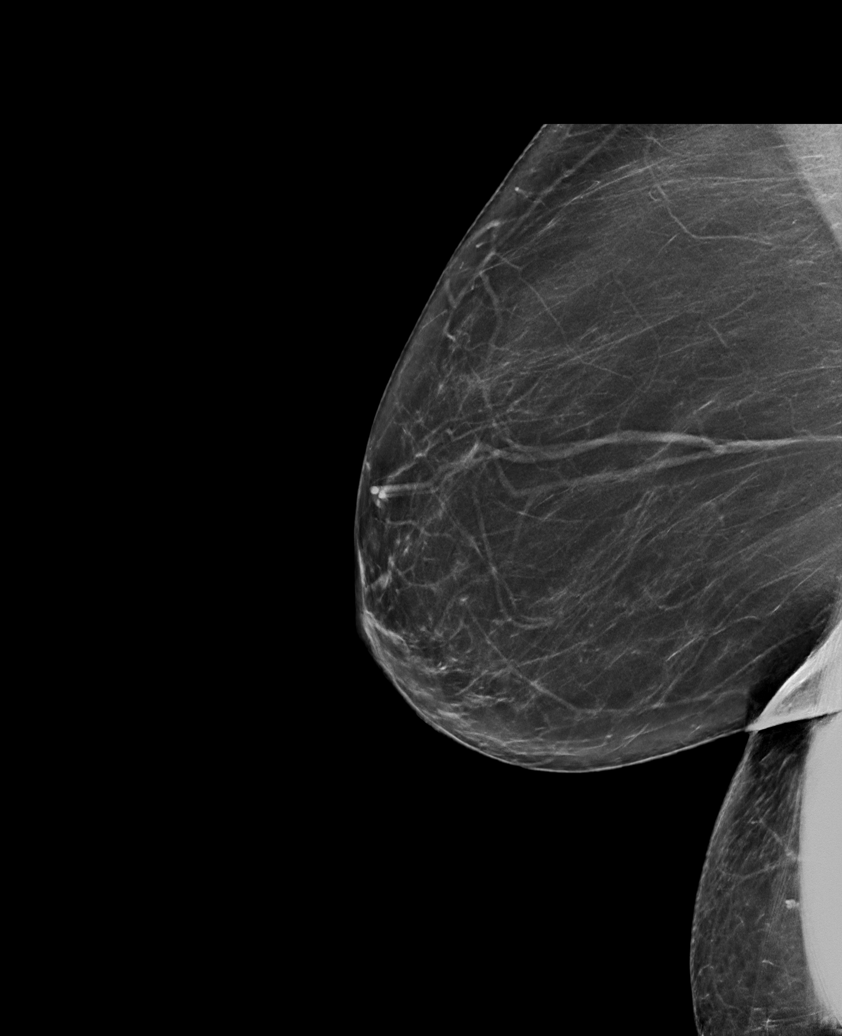

[L MLO synth-2D (2 of 2)]
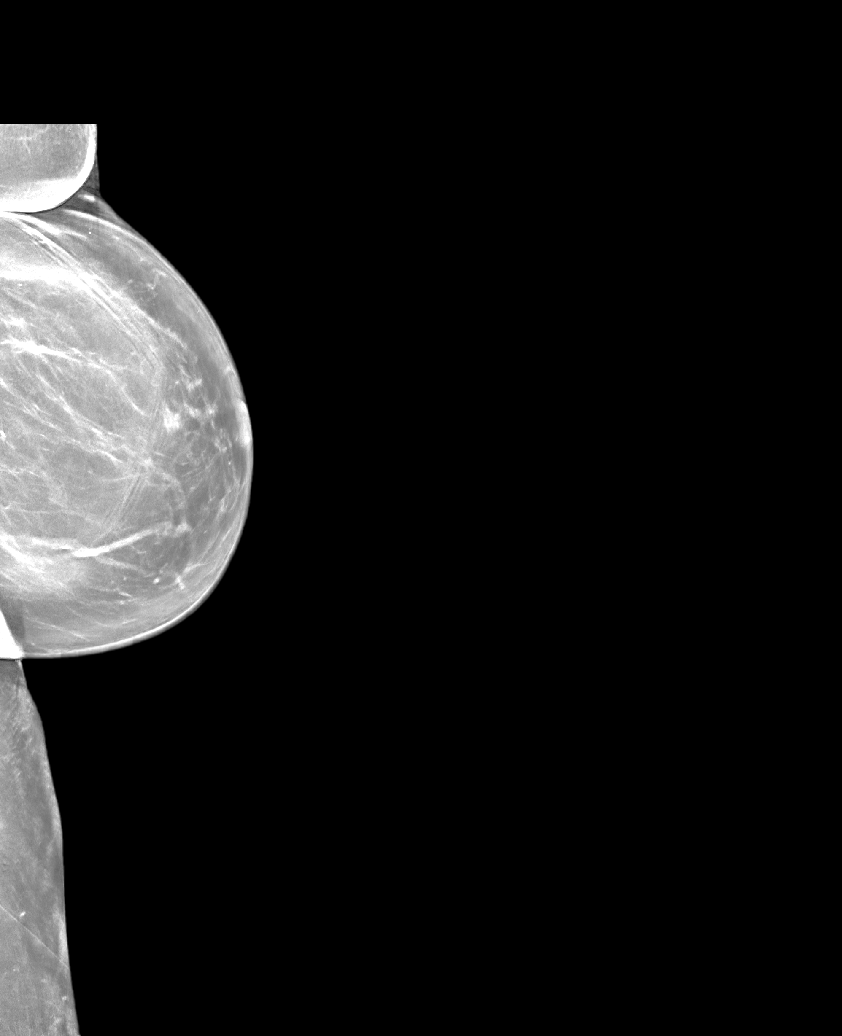

[6 of 36 positions shown; findings below may reference images not displayed]

ACR Breast Density Category b: There are scattered areas of
fibroglandular density.
FINDINGS: There are no findings suspicious for malignancy. Images were
processed with CAD.
IMPRESSION: No mammographic evidence of malignancy. A result letter of this
screening mammogram will be mailed directly to the patient.

RECOMMENDATION:
Screening mammogram in one year. (Code:[TQ])

BI-RADS CATEGORY  1: Negative.

## 2019-07-30 ENCOUNTER — Telehealth: Payer: Self-pay | Admitting: Internal Medicine

## 2019-07-30 NOTE — Telephone Encounter (Signed)
Alprazolam refill.   Last OV: 02/01/2019 Last Fill: 04/07/2019 #60 and 1RF Pt sig: 1-2 tab bid prn UDS: 04/21/2017 Low risk  Needs UDS at next OV

## 2019-08-02 NOTE — Telephone Encounter (Signed)
PDMP okay, Rx sent 

## 2019-09-01 ENCOUNTER — Telehealth: Payer: Self-pay | Admitting: Internal Medicine

## 2019-09-01 NOTE — Telephone Encounter (Signed)
Pt called and lvm to return call 

## 2019-09-01 NOTE — Telephone Encounter (Signed)
Patient states that she is very nausated and not eating well for two weeks. Patient would like to know that she should do.   Please Advise

## 2019-09-01 NOTE — Telephone Encounter (Signed)
If severe symptoms, severe pain, fever chills, blood in the stools: Needs to go to the ER If not,  set up a visit with me or one of my partners ASAP.

## 2019-09-02 NOTE — Telephone Encounter (Signed)
Left message on machine to call back to set up appt if needed

## 2019-09-03 ENCOUNTER — Encounter: Payer: Self-pay | Admitting: Internal Medicine

## 2019-09-05 ENCOUNTER — Emergency Department (HOSPITAL_COMMUNITY)
Admission: EM | Admit: 2019-09-05 | Discharge: 2019-09-05 | Disposition: A | Discharge status: Home / Self Care | Payer: Medicare Other | Attending: Emergency Medicine | Admitting: Emergency Medicine

## 2019-09-05 ENCOUNTER — Encounter (HOSPITAL_COMMUNITY): Payer: Self-pay | Admitting: Obstetrics and Gynecology

## 2019-09-05 ENCOUNTER — Other Ambulatory Visit: Payer: Self-pay

## 2019-09-05 DIAGNOSIS — Z5321 Procedure and treatment not carried out due to patient leaving prior to being seen by health care provider: Secondary | ICD-10-CM | POA: Diagnosis not present

## 2019-09-05 DIAGNOSIS — R110 Nausea: Secondary | ICD-10-CM | POA: Insufficient documentation

## 2019-09-05 DIAGNOSIS — R197 Diarrhea, unspecified: Secondary | ICD-10-CM | POA: Diagnosis not present

## 2019-09-05 LAB — CBC
HCT: 44.3 % (ref 36.0–46.0)
Hemoglobin: 15.3 g/dL — ABNORMAL HIGH (ref 12.0–15.0)
MCH: 35.8 pg — ABNORMAL HIGH (ref 26.0–34.0)
MCHC: 34.5 g/dL (ref 30.0–36.0)
MCV: 103.7 fL — ABNORMAL HIGH (ref 80.0–100.0)
Platelets: 365 10*3/uL (ref 150–400)
RBC: 4.27 MIL/uL (ref 3.87–5.11)
RDW: 13.3 % (ref 11.5–15.5)
WBC: 11.2 10*3/uL — ABNORMAL HIGH (ref 4.0–10.5)
nRBC: 0 % (ref 0.0–0.2)

## 2019-09-05 LAB — COMPREHENSIVE METABOLIC PANEL
ALT: 48 U/L — ABNORMAL HIGH (ref 0–44)
AST: 68 U/L — ABNORMAL HIGH (ref 15–41)
Albumin: 3.4 g/dL — ABNORMAL LOW (ref 3.5–5.0)
Alkaline Phosphatase: 87 U/L (ref 38–126)
Anion gap: 10 (ref 5–15)
BUN: 6 mg/dL — ABNORMAL LOW (ref 8–23)
CO2: 21 mmol/L — ABNORMAL LOW (ref 22–32)
Calcium: 8.8 mg/dL — ABNORMAL LOW (ref 8.9–10.3)
Chloride: 99 mmol/L (ref 98–111)
Creatinine, Ser: 0.73 mg/dL (ref 0.44–1.00)
GFR calc Af Amer: >60 mL/min (ref >60)
GFR calc non Af Amer: >60 mL/min (ref >60)
Glucose, Bld: 138 mg/dL — ABNORMAL HIGH (ref 70–99)
Potassium: 4.1 mmol/L (ref 3.5–5.1)
Sodium: 130 mmol/L — ABNORMAL LOW (ref 135–145)
Total Bilirubin: 1.1 mg/dL (ref 0.3–1.2)
Total Protein: 6.6 g/dL (ref 6.5–8.1)

## 2019-09-05 LAB — LIPASE, BLOOD: Lipase: 25 U/L (ref 11–51)

## 2019-09-05 NOTE — ED Triage Notes (Signed)
Patient reports she is nauseated and has had diarrhea. Patient denies COVID exposure. Patient denies Chest pain or SOB.

## 2019-09-06 ENCOUNTER — Encounter: Payer: Self-pay | Admitting: Internal Medicine

## 2019-09-06 ENCOUNTER — Telehealth (INDEPENDENT_AMBULATORY_CARE_PROVIDER_SITE_OTHER): Payer: Medicare Other | Admitting: Internal Medicine

## 2019-09-06 VITALS — Ht 62.0 in

## 2019-09-06 DIAGNOSIS — F1020 Alcohol dependence, uncomplicated: Secondary | ICD-10-CM | POA: Diagnosis not present

## 2019-09-06 DIAGNOSIS — R197 Diarrhea, unspecified: Secondary | ICD-10-CM

## 2019-09-06 DIAGNOSIS — R0902 Hypoxemia: Secondary | ICD-10-CM

## 2019-09-06 MED ORDER — ONDANSETRON HCL 8 MG PO TABS
8.0000 mg | ORAL_TABLET | Freq: Three times a day (TID) | ORAL | 0 refills | Status: DC | PRN
Start: 2019-09-06 — End: 2019-09-21

## 2019-09-06 NOTE — Progress Notes (Signed)
Subjective:    Patient ID: Monique Ballard, female    DOB: 09-28-1940, 79 y.o.   MRN: [id number]  DOS:  09/06/2019 Type of visit - description: Virtual Visit via Video Note  I connected with the above patient  by a video enabled telemedicine application and verified that I am speaking with the correct person using two identifiers.   THIS ENCOUNTER IS A VIRTUAL VISIT DUE TO COVID-19 - PATIENT WAS NOT SEEN IN THE OFFICE. PATIENT HAS CONSENTED TO VIRTUAL VISIT / TELEMEDICINE VISIT   Location of patient: home  Location of provider: office  Persons participating in the virtual visit: patient, provider   I discussed the limitations of evaluation and management by telemedicine and the availability of in person appointments. The patient expressed understanding and agreed to proceed.  Acute, here with her daughter Monique Ballard Symptoms of started approximately 10 days ago with nausea and diarrhea. She had an average 1 or 2 bowel movements a day, usually postprandial, they are watery, nonbloody. She has vomited a couple of times. She is eating a bland diet and drinking plenty of fluids. Urine output is normal No sick contacts, no other family members affected by diarrhea.  On June 25 she lost a friend and since then he is EtOH intake increased significantly until few days ago when the family noticed that.     BP Readings from Last 3 Encounters:  10/02/18 124/63  06/17/18 121/72  06/10/18 121/75    Review of Systems    Denies fever chills No chest pain no difficulty breathing No abdominal pain She has a chronic dry cough, unchanged. No myalgias  Went to the ER and left without seen. Labs and set of vital signs and O2 sats obtained.     Past Medical History:  Diagnosis Date  . Breast cancer ([place]) fall 1997    Left Lumpectomy, XRT.  treated with Tamoxifem and Evista  . Depression   . Endometriosis in her 20's   surgery to help clear endometriosis to obtain a pregnancy.  Marland Kitchen GERD  (gastroesophageal reflux disease)    w/"stretching" remotely  . HTN (hypertension)     Past Surgical History:  Procedure Laterality Date  . BREAST LUMPECTOMY Left fall 1997   Lymph nodes X 3 were negative, ER+, Radiation treatmetn.  Took Tamoxifem X 5 yrs then Evista for 3-5 yrs.  . CESAREAN SECTION     x2  first pregnancy breach and second normal  . PELVIC FRACTURE SURGERY     endometriosis, remotely    Allergies as of 09/06/2019      Reactions   Penicillins Rash   Has patient had a PCN reaction causing immediate rash, facial/tongue/throat swelling, SOB or lightheadedness with hypotension: Yes Has patient had a PCN reaction causing severe rash involving mucus membranes or skin necrosis: No Has patient had a PCN reaction that required hospitalization No Has patient had a PCN reaction occurring within the last 10 years: No If all of the above answers are "NO", then may proceed with Cephalosporin use.      Medication List       Accurate as of September 06, 2019  8:39 AM. If you have any questions, ask your nurse or doctor.        ALIGN PO Take by mouth daily.   ALPRAZolam 0.25 MG tablet Commonly known as: XANAX TAKE ONE OR TWO TABLETS BY MOUTH TWICE DAILY AS NEEDED FOR ANXIETY   CENTRUM SILVER PO Take 1 each by mouth daily.  escitalopram 20 MG tablet Commonly known as: LEXAPRO Take 1 tablet (20 mg total) by mouth daily.   metoprolol tartrate 50 MG tablet Commonly known as: LOPRESSOR Take 1 tablet (50 mg total) by mouth 2 (two) times daily.   OLANZapine 7.5 MG tablet Commonly known as: ZYPREXA TAKE ONE TABLET BY MOUTH DAILY AT BEDTIME   ondansetron 4 MG disintegrating tablet Commonly known as: ZOFRAN-ODT Take 4 mg by mouth every 8 (eight) hours as needed for nausea or vomiting.   Oscal 500/200 D-3 500-200 MG-UNIT tablet Generic drug: calcium-vitamin D Take 1 tablet by mouth daily.   pantoprazole 40 MG tablet Commonly known as: PROTONIX Take 1 tablet (40 mg  total) by mouth daily.          Objective:   Physical Exam Ht 5\' 2"  (1.575 m)   LMP 01/22/1992 (Within Months)   BMI 27.44 kg/m  No vital signs from today, but yesterday at the ER: She was afebrile, BP 105/85, heart rate 84.  O2 sat 88% room air Today she looks well, alert oriented x3, in no distress      Assessment     Assessment HTN Depression, Anxiety - Dr Clovis Pu  Tremor dx [phone] Gait d/o: unsteady onset ~ 2017 GERD with esophageal stricture L Breast cancer, 1997, lumpectomy, XRT, released from oncology ETOH  PLAN: Acute diarrhea: As described above with no red flags. Blood work done yesterday at the ER before she left show a normal creatinine, increase LFTs, slight increase in WBCs. I have seen other patients in the community with similar symptoms, viral diarrhea?  Covid?. Plan: Continue pushing fluids and a bland diet, check for Covid (patient will set up) wait few more days. Hypoxemia: O2 sat yesterday at the ER show a O2 sat of 88%, no chest pain, cough, shortness of breath.     Accurate reading?  I asked the patient and her daughter to get her O2 sat confirm hypoxemia, if that is the case further eval will be needed immediately. (See message, O2 sat 92% consistently today) EtOH: Started to drink again after she lost a friend in June, she is already doing better with the support of her family.    I discussed the assessment and treatment plan with the patient. The patient was provided an opportunity to ask questions and all were answered. The patient agreed with the plan and demonstrated an understanding of the instructions.   The patient was advised to call back or seek an in-person evaluation if the symptoms worsen or if the condition fails to improve as anticipated.

## 2019-09-07 ENCOUNTER — Other Ambulatory Visit: Payer: Medicare Other

## 2019-09-07 ENCOUNTER — Other Ambulatory Visit: Payer: Self-pay

## 2019-09-07 DIAGNOSIS — Z20822 Contact with and (suspected) exposure to covid-19: Secondary | ICD-10-CM

## 2019-09-07 NOTE — Assessment & Plan Note (Signed)
Acute diarrhea: As described above with no red flags. Blood work done yesterday at the ER before she left show a normal creatinine, increase LFTs, slight increase in WBCs. I have seen other patients in the community with similar symptoms, viral diarrhea?  Covid?. Plan: Continue pushing fluids and a bland diet, check for Covid (patient will set up) wait few more days. Hypoxemia: O2 sat yesterday at the ER show a O2 sat of 88%, no chest pain, cough, shortness of breath.     Accurate reading?  I asked the patient and her daughter to get her O2 sat confirm hypoxemia, if that is the case further eval will be needed immediately. (See message, O2 sat 92% consistently today) EtOH: Started to drink again after she lost a friend in June, she is already doing better with the support of her family.

## 2019-09-08 LAB — SARS-COV-2, NAA 2 DAY TAT

## 2019-09-08 LAB — NOVEL CORONAVIRUS, NAA: SARS-CoV-2, NAA: NOT DETECTED

## 2019-09-10 ENCOUNTER — Other Ambulatory Visit: Payer: Medicare Other

## 2019-09-21 ENCOUNTER — Other Ambulatory Visit: Payer: Self-pay | Admitting: Internal Medicine

## 2019-09-24 ENCOUNTER — Other Ambulatory Visit: Payer: Self-pay | Admitting: Internal Medicine

## 2019-09-24 ENCOUNTER — Telehealth: Payer: Self-pay

## 2019-09-24 NOTE — Telephone Encounter (Signed)
Monique Ballard- Pt's daughter called to let us know that she came in from out of town today and see her mom. Pt was in bed and had "soiled" herself, she is very weak and sleepy. I informed that diarrhea and nausea has been going on now for 3.5 weeks- recommend to take her back to ED (recommended [facility] over [facility] or [facility]). Monique Ballard verbalized understanding.

## 2019-09-28 ENCOUNTER — Telehealth: Payer: Self-pay | Admitting: Internal Medicine

## 2019-09-28 NOTE — Telephone Encounter (Signed)
Caller: [name] Call back # [phone]  Daughter would like to speak to you in regards to mom declining health

## 2019-09-29 NOTE — Telephone Encounter (Signed)
[name], [facility] I will be happy to talk to her tomorrow. If Monique Ballard is not doing well, ideally I would like to see her in person but if  she is doing very poorly, a ER evaluation is probably needed.

## 2019-09-29 NOTE — Telephone Encounter (Signed)
Dr. Larose Kells- I spoke w/ [name] last week-(see other telephone encounter)- she informed that her mom is very weak, still having abdominal issues, I had instructed her to take her to ED for possible fluids, but I don't see where Pt was taken. Can you discuss w/ her?

## 2019-09-30 ENCOUNTER — Encounter (HOSPITAL_BASED_OUTPATIENT_CLINIC_OR_DEPARTMENT_OTHER): Payer: Self-pay | Admitting: *Deleted

## 2019-09-30 ENCOUNTER — Emergency Department (HOSPITAL_BASED_OUTPATIENT_CLINIC_OR_DEPARTMENT_OTHER): Payer: Medicare Other

## 2019-09-30 ENCOUNTER — Other Ambulatory Visit: Payer: Self-pay

## 2019-09-30 ENCOUNTER — Inpatient Hospital Stay (HOSPITAL_BASED_OUTPATIENT_CLINIC_OR_DEPARTMENT_OTHER)
Admission: EM | Admit: 2019-09-30 | Discharge: 2019-10-02 | DRG: 189 | Disposition: A | Discharge status: Home / Self Care | Payer: Medicare Other | Attending: Internal Medicine | Admitting: Internal Medicine

## 2019-09-30 DIAGNOSIS — Z923 Personal history of irradiation: Secondary | ICD-10-CM

## 2019-09-30 DIAGNOSIS — F32A Depression, unspecified: Secondary | ICD-10-CM | POA: Diagnosis present

## 2019-09-30 DIAGNOSIS — Z8249 Family history of ischemic heart disease and other diseases of the circulatory system: Secondary | ICD-10-CM

## 2019-09-30 DIAGNOSIS — F329 Major depressive disorder, single episode, unspecified: Secondary | ICD-10-CM | POA: Diagnosis present

## 2019-09-30 DIAGNOSIS — E876 Hypokalemia: Secondary | ICD-10-CM | POA: Diagnosis present

## 2019-09-30 DIAGNOSIS — R5383 Other fatigue: Secondary | ICD-10-CM | POA: Diagnosis not present

## 2019-09-30 DIAGNOSIS — J9601 Acute respiratory failure with hypoxia: Principal | ICD-10-CM | POA: Diagnosis present

## 2019-09-30 DIAGNOSIS — F419 Anxiety disorder, unspecified: Secondary | ICD-10-CM | POA: Diagnosis present

## 2019-09-30 DIAGNOSIS — R778 Other specified abnormalities of plasma proteins: Secondary | ICD-10-CM | POA: Diagnosis present

## 2019-09-30 DIAGNOSIS — J9600 Acute respiratory failure, unspecified whether with hypoxia or hypercapnia: Secondary | ICD-10-CM | POA: Diagnosis present

## 2019-09-30 DIAGNOSIS — Z20822 Contact with and (suspected) exposure to covid-19: Secondary | ICD-10-CM | POA: Diagnosis present

## 2019-09-30 DIAGNOSIS — Z853 Personal history of malignant neoplasm of breast: Secondary | ICD-10-CM

## 2019-09-30 DIAGNOSIS — Z79899 Other long term (current) drug therapy: Secondary | ICD-10-CM

## 2019-09-30 DIAGNOSIS — R000 Tachycardia, unspecified: Secondary | ICD-10-CM | POA: Diagnosis not present

## 2019-09-30 DIAGNOSIS — R0602 Shortness of breath: Secondary | ICD-10-CM | POA: Diagnosis not present

## 2019-09-30 DIAGNOSIS — Z170 Estrogen receptor positive status [ER+]: Secondary | ICD-10-CM

## 2019-09-30 DIAGNOSIS — F1020 Alcohol dependence, uncomplicated: Secondary | ICD-10-CM | POA: Diagnosis present

## 2019-09-30 DIAGNOSIS — I10 Essential (primary) hypertension: Secondary | ICD-10-CM | POA: Diagnosis present

## 2019-09-30 DIAGNOSIS — Z87891 Personal history of nicotine dependence: Secondary | ICD-10-CM

## 2019-09-30 DIAGNOSIS — Z880 Allergy status to penicillin: Secondary | ICD-10-CM

## 2019-09-30 DIAGNOSIS — K219 Gastro-esophageal reflux disease without esophagitis: Secondary | ICD-10-CM | POA: Diagnosis present

## 2019-09-30 DIAGNOSIS — R05 Cough: Secondary | ICD-10-CM | POA: Diagnosis present

## 2019-09-30 LAB — CBC WITH DIFFERENTIAL/PLATELET
Abs Immature Granulocytes: 0.22 10*3/uL — ABNORMAL HIGH (ref 0.00–0.07)
Basophils Absolute: 0 10*3/uL (ref 0.0–0.1)
Basophils Relative: 0 %
Eosinophils Absolute: 0 10*3/uL (ref 0.0–0.5)
Eosinophils Relative: 0 %
HCT: 41.6 % (ref 36.0–46.0)
Hemoglobin: 14.7 g/dL (ref 12.0–15.0)
Immature Granulocytes: 2 %
Lymphocytes Relative: 14 %
Lymphs Abs: 1.7 10*3/uL (ref 0.7–4.0)
MCH: 35.5 pg — ABNORMAL HIGH (ref 26.0–34.0)
MCHC: 35.3 g/dL (ref 30.0–36.0)
MCV: 100.5 fL — ABNORMAL HIGH (ref 80.0–100.0)
Monocytes Absolute: 1.4 10*3/uL — ABNORMAL HIGH (ref 0.1–1.0)
Monocytes Relative: 12 %
Neutro Abs: 8.7 10*3/uL — ABNORMAL HIGH (ref 1.7–7.7)
Neutrophils Relative %: 72 %
Platelets: 355 10*3/uL (ref 150–400)
RBC: 4.14 MIL/uL (ref 3.87–5.11)
RDW: 12.9 % (ref 11.5–15.5)
WBC: 12 10*3/uL — ABNORMAL HIGH (ref 4.0–10.5)
nRBC: 0 % (ref 0.0–0.2)

## 2019-09-30 LAB — COMPREHENSIVE METABOLIC PANEL
ALT: 33 U/L (ref 0–44)
AST: 45 U/L — ABNORMAL HIGH (ref 15–41)
Albumin: 3 g/dL — ABNORMAL LOW (ref 3.5–5.0)
Alkaline Phosphatase: 75 U/L (ref 38–126)
Anion gap: 11 (ref 5–15)
BUN: 6 mg/dL — ABNORMAL LOW (ref 8–23)
CO2: 24 mmol/L (ref 22–32)
Calcium: 8.4 mg/dL — ABNORMAL LOW (ref 8.9–10.3)
Chloride: 93 mmol/L — ABNORMAL LOW (ref 98–111)
Creatinine, Ser: 0.74 mg/dL (ref 0.44–1.00)
GFR calc Af Amer: >60 mL/min (ref >60)
GFR calc non Af Amer: >60 mL/min (ref >60)
Glucose, Bld: 129 mg/dL — ABNORMAL HIGH (ref 70–99)
Potassium: 2.9 mmol/L — ABNORMAL LOW (ref 3.5–5.1)
Sodium: 128 mmol/L — ABNORMAL LOW (ref 135–145)
Total Bilirubin: 0.9 mg/dL (ref 0.3–1.2)
Total Protein: 6.1 g/dL — ABNORMAL LOW (ref 6.5–8.1)

## 2019-09-30 LAB — I-STAT ARTERIAL BLOOD GAS, ED
Acid-Base Excess: 1 mmol/L (ref 0.0–2.0)
Bicarbonate: 21.7 mmol/L (ref 20.0–28.0)
Calcium, Ion: 1.13 mmol/L — ABNORMAL LOW (ref 1.15–1.40)
HCT: 44 % (ref 36.0–46.0)
Hemoglobin: 15 g/dL (ref 12.0–15.0)
O2 Saturation: 85 %
Patient temperature: 98.6
Potassium: 2.7 mmol/L — CL (ref 3.5–5.1)
Sodium: 131 mmol/L — ABNORMAL LOW (ref 135–145)
TCO2: 22 mmol/L (ref 22–32)
pCO2 arterial: 24.4 mmHg — ABNORMAL LOW (ref 32.0–48.0)
pH, Arterial: 7.558 — ABNORMAL HIGH (ref 7.350–7.450)
pO2, Arterial: 41 mmHg — ABNORMAL LOW (ref 83.0–108.0)

## 2019-09-30 LAB — COOXEMETRY PANEL
Carboxyhemoglobin: 0.2 % — ABNORMAL LOW (ref 0.5–1.5)
Methemoglobin: 0.5 % (ref 0.0–1.5)
O2 Saturation: 22.6 %
Total hemoglobin: 14.4 g/dL (ref 12.0–16.0)

## 2019-09-30 LAB — BLOOD GAS, ARTERIAL

## 2019-09-30 LAB — TROPONIN I (HIGH SENSITIVITY): Troponin I (High Sensitivity): 48 ng/L — ABNORMAL HIGH (ref <18)

## 2019-09-30 LAB — MAGNESIUM: Magnesium: 1.9 mg/dL (ref 1.7–2.4)

## 2019-09-30 LAB — BRAIN NATRIURETIC PEPTIDE: B Natriuretic Peptide: 100.1 pg/mL — ABNORMAL HIGH (ref 0.0–100.0)

## 2019-09-30 LAB — SARS CORONAVIRUS 2 BY RT PCR (HOSPITAL ORDER, PERFORMED IN ~~LOC~~ HOSPITAL LAB): SARS Coronavirus 2: NEGATIVE

## 2019-09-30 IMAGING — CT CT ANGIO CHEST
2 of 10 series · 18 of 36 positions shown · IV contrast (omnipaque)
Comparison: None.

CLINICAL DATA: Hypoxia and shortness of breath

EXAM:
CT ANGIOGRAPHY CHEST WITH CONTRAST
TECHNIQUE: Multidetector CT imaging of the chest was performed using the
standard protocol during bolus administration of intravenous
contrast. Multiplanar CT image reconstructions and MIPs were
obtained to evaluate the vascular anatomy.
CONTRAST:  75mL OMNIPAQUE IOHEXOL 350 MG/ML SOLN

[Series 8: pe coronal mpr · coronal · 0.57mm/px · 1 of 105 slices shown]
[im 53/105  mediastinal]
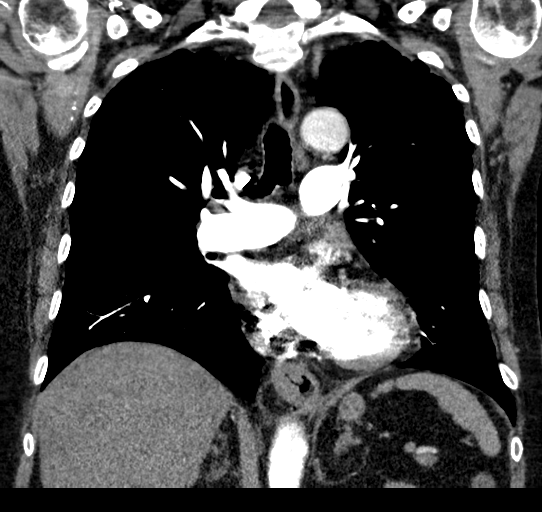

[Series 12: pe thins · axial · 0.69mm/px · z∈[-261,-11]mm · 17 of 282 slices shown]
[im 16/282  lung]
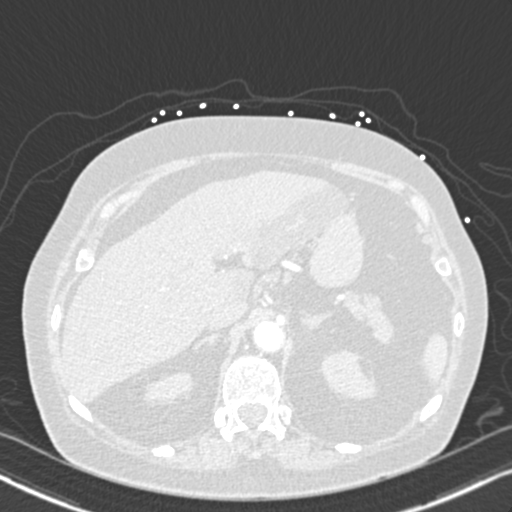
[im 32/282  mediastinal]
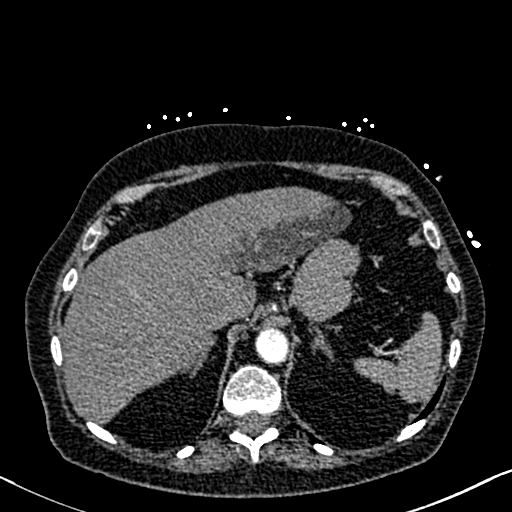
[im 47/282  lung]
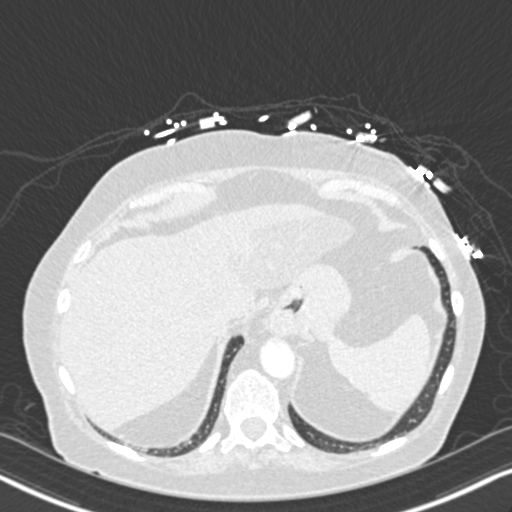
[im 63/282  mediastinal]
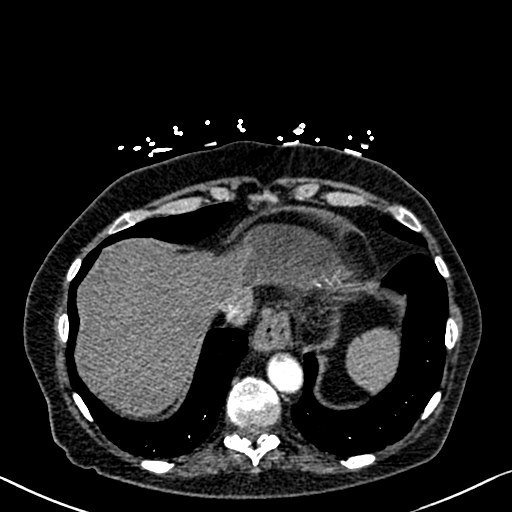
[im 79/282  lung]
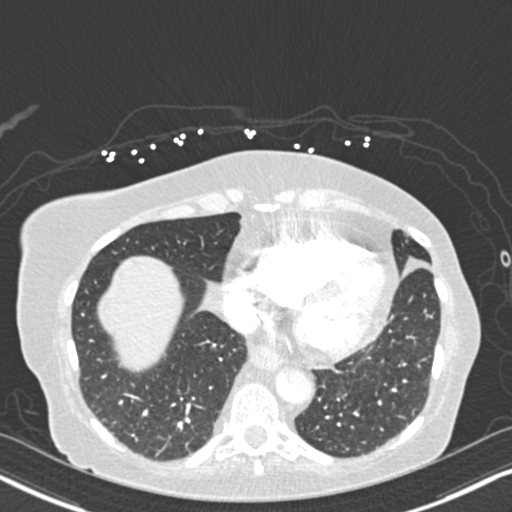
[im 94/282  mediastinal]
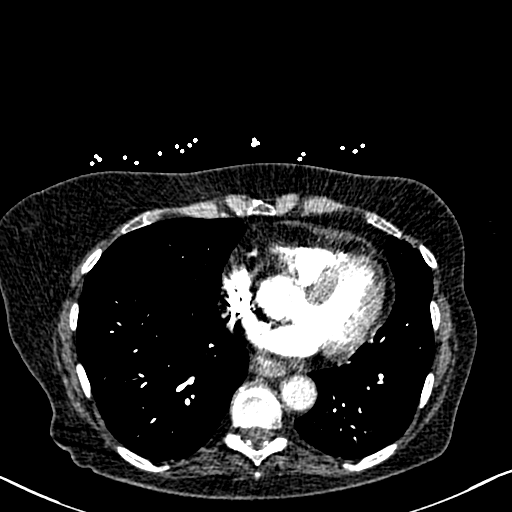
[im 110/282  lung]
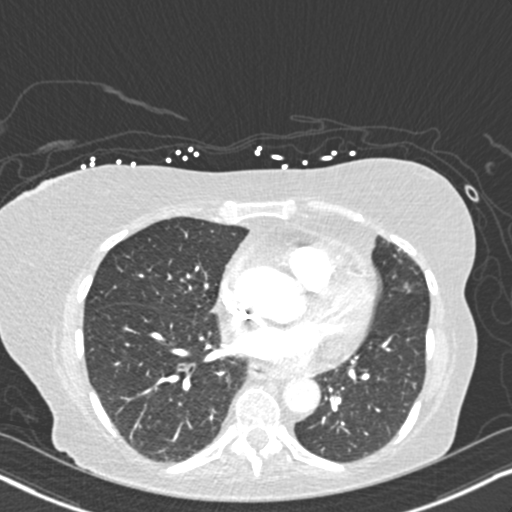
[im 125/282  mediastinal]
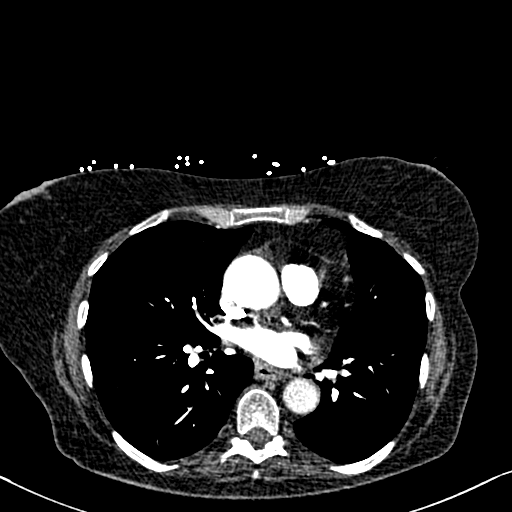
[im 141/282  lung]
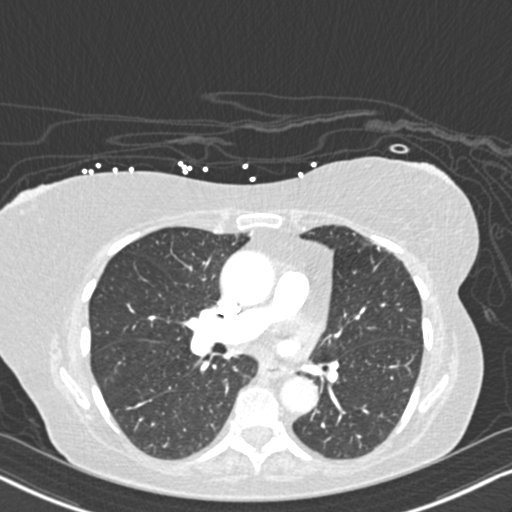
[im 157/282  mediastinal]
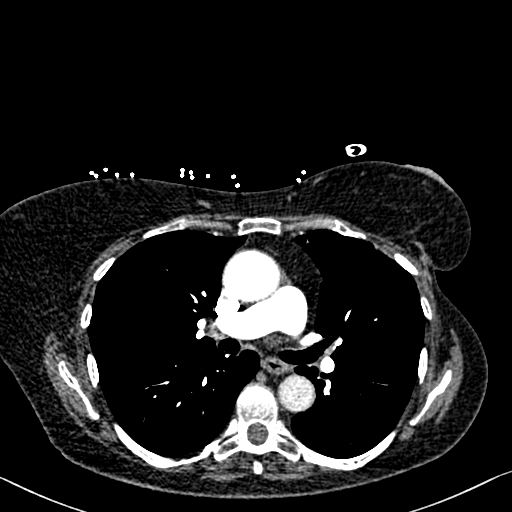
[im 172/282  lung]
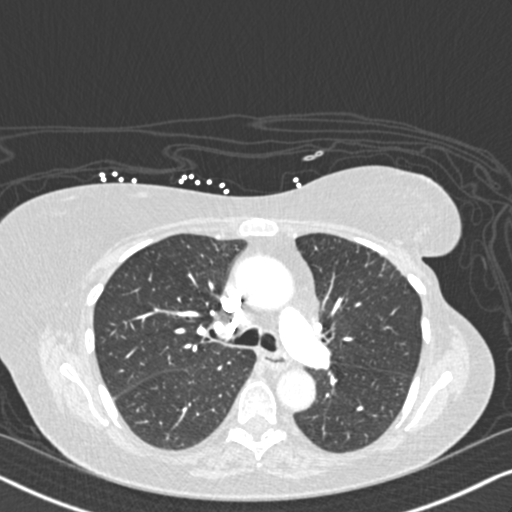
[im 188/282  mediastinal]
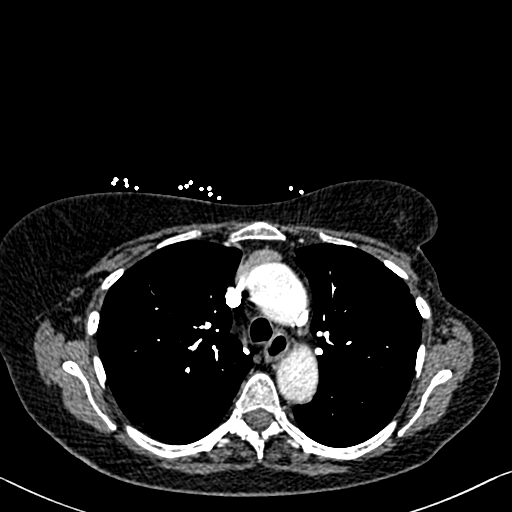
[im 203/282  lung]
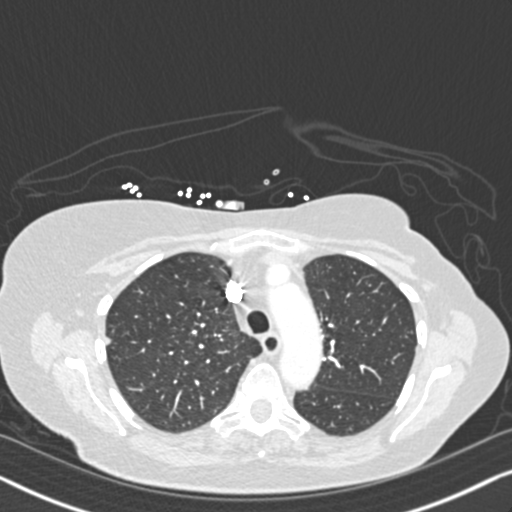
[im 219/282  mediastinal]
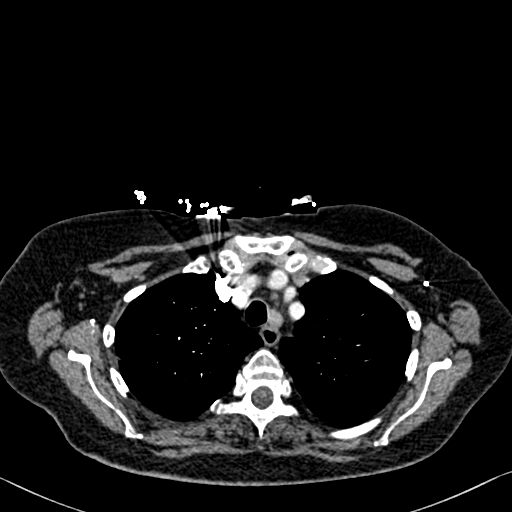
[im 235/282  lung]
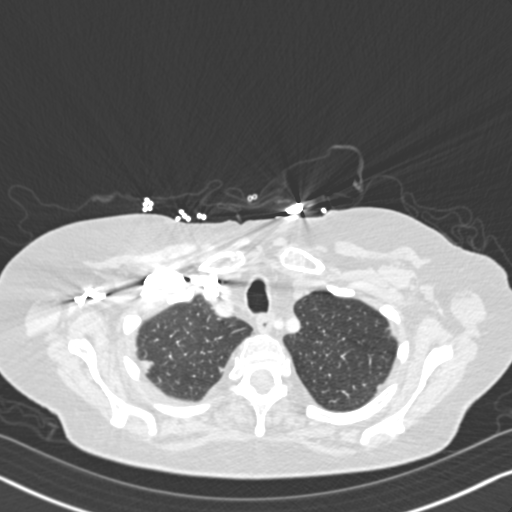
[im 250/282  mediastinal]
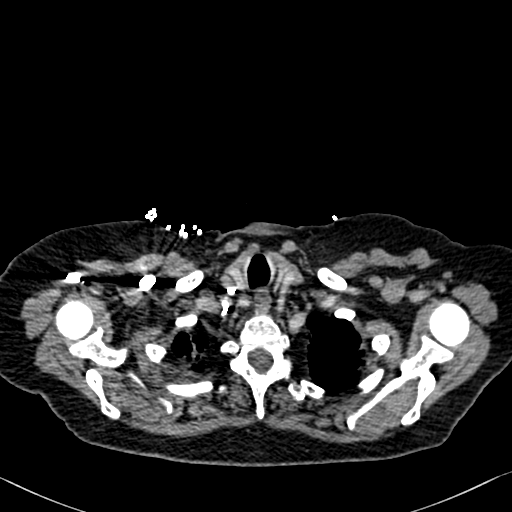
[im 266/282  lung]
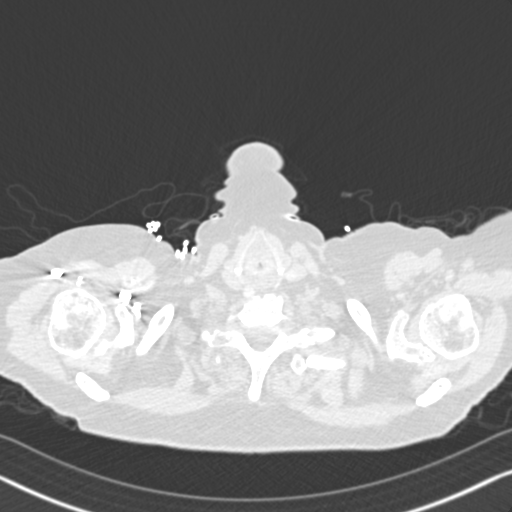

[18 of 36 positions shown; findings below may reference images not displayed]

FINDINGS: Cardiovascular: No cardiac enlargement is noted. Thoracic aorta
demonstrates mild atherosclerotic calcifications without aneurysmal
dilatation. The pulmonary artery shows a normal branching pattern.
No filling defects to suggest pulmonary emboli are identified.

Mediastinum/Nodes: Thoracic inlet is within normal limits. No hilar
or mediastinal adenopathy is noted. Esophagus as visualized is
within normal limits with the exception of a small hiatal hernia.

Lungs/Pleura: Lungs are well aerated bilaterally. No focal
infiltrate or sizable effusion is seen. Nodular thickening is noted
along the major fissure on the left best seen on image number 43 of
series 7 likely representing a Peri fissural lymph node. No other
significant nodular changes are noted. No focal infiltrate or
sizable effusion is seen.

Upper Abdomen: Visualized upper abdomen demonstrates a geographic
area of decreased attenuation in the left lateral segment likely
representing differential perfusion. No other focal abnormality in
the upper abdomen is noted.

Musculoskeletal: Degenerative changes of the thoracic spine are
seen. No acute rib abnormality is noted.

Review of the MIP images confirms the above findings.
IMPRESSION: No evidence of pulmonary emboli.

Small Peri fissural lymph node on the left.

Geographic area of decreased attenuation in the left lobe of the
liver likely related to differential perfusion.

## 2019-09-30 IMAGING — DX DG CHEST 1V PORT
1 series · 1 of 1 positions shown · non-contrast
Comparison: [DATE]

CLINICAL DATA: Hypoxia, fatigue

EXAM:
PORTABLE CHEST 1 VIEW

[chest ap]
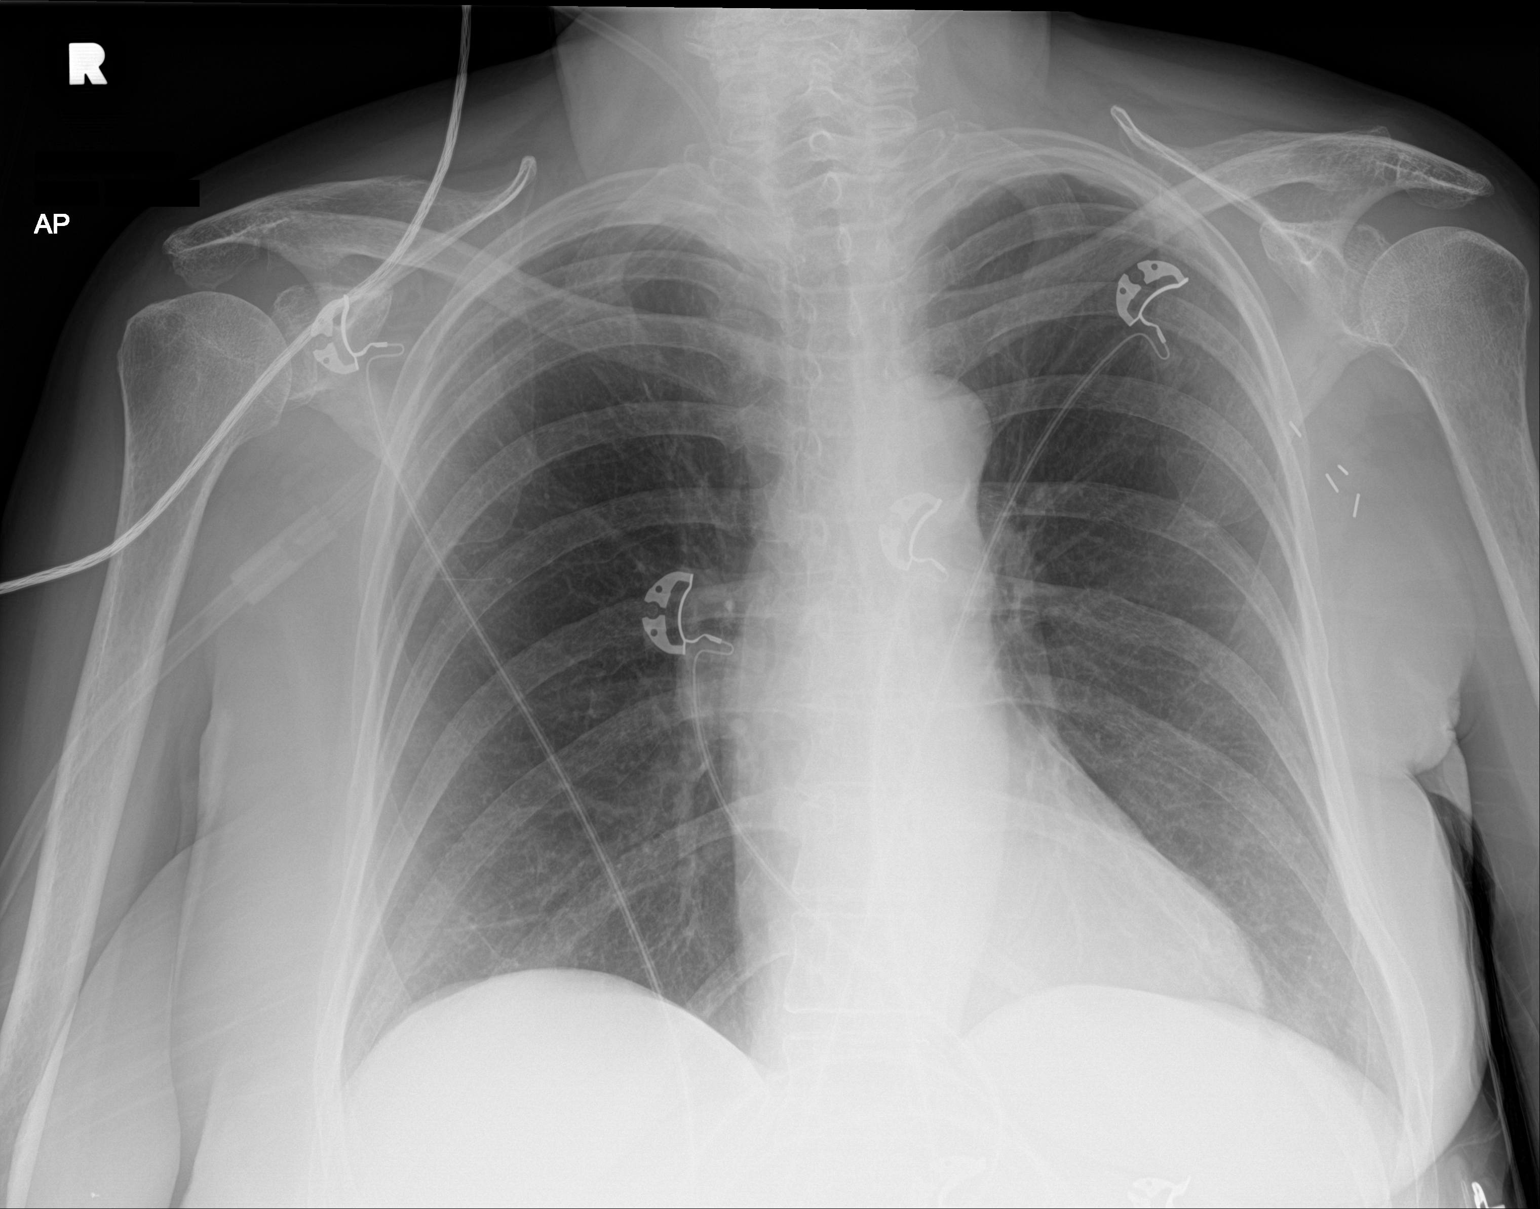

[1 of 1 positions shown; findings below may reference images not displayed]

FINDINGS: The heart size and mediastinal contours are within normal limits.
Atherosclerotic calcification of the aortic knob. Both lungs are
clear. The visualized skeletal structures are unremarkable. Surgical
clips in the left axillary region.
IMPRESSION: No active disease.

## 2019-09-30 MED ORDER — IOHEXOL 350 MG/ML SOLN
100.0000 mL | Freq: Once | INTRAVENOUS | Status: AC | PRN
  Administered 2019-09-30: 75 mL via INTRAVENOUS

## 2019-09-30 MED ORDER — IPRATROPIUM-ALBUTEROL 0.5-2.5 (3) MG/3ML IN SOLN
3.0000 mL | Freq: Once | RESPIRATORY_TRACT | Status: AC
  Administered 2019-09-30: 3 mL via RESPIRATORY_TRACT
  Filled 2019-09-30: qty 3

## 2019-09-30 MED ORDER — METHYLPREDNISOLONE SODIUM SUCC 125 MG IJ SOLR
125.0000 mg | Freq: Once | INTRAMUSCULAR | Status: AC
  Administered 2019-09-30: 125 mg via INTRAVENOUS
  Filled 2019-09-30: qty 2

## 2019-09-30 NOTE — Progress Notes (Signed)
Patient's has been weaned to 12 liter Hi-Flo nasal cannula.  Patient's SPO2 remains at 100%.  RT will continue to monitor.

## 2019-09-30 NOTE — ED Notes (Signed)
ABG obtained to verify SAT, sat was correct. MD aware.

## 2019-09-30 NOTE — ED Provider Notes (Signed)
  Provider Note MRN:  [id number]  Arrival date & time: 09/30/19    ED Course and Medical Decision Making  Assumed care from Dr. Tyrone Nine at shift change.  CTPA is negative.  Patient continues to require 6 L nasal cannula, sitting comfortably this oxygen supplement.  No wheezes on my exam.  Unclear if undiagnosed obstructive lung disease, restrictive lung disease, false negative Covid.  Will admit to medicine.  .Critical Care Performed by: Maudie Flakes, MD Authorized by: Maudie Flakes, MD   Critical care provider statement:    Critical care time (minutes):  32   Critical care was necessary to treat or prevent imminent or life-threatening deterioration of the following conditions:  Respiratory failure   Critical care was time spent personally by me on the following activities:  Discussions with consultants, evaluation of patient's response to treatment, examination of patient, ordering and performing treatments and interventions, ordering and review of laboratory studies, ordering and review of radiographic studies, pulse oximetry, re-evaluation of patient's condition, obtaining history from patient or surrogate and review of old charts   I assumed direction of critical care for this patient from another provider in my specialty: yes      Final Clinical Impressions(s) / ED Diagnoses     ICD-10-CM   1. Acute respiratory failure with hypoxia [facility])  J96.01     ED Discharge Orders    None      Discharge Instructions   None     Barth Kirks. Sedonia Small, [place] [email or web address]    Maudie Flakes, MD 09/30/19 [phone]

## 2019-09-30 NOTE — ED Notes (Signed)
Decreased O2 from 15L to 12L humidified O2 at this time

## 2019-09-30 NOTE — ED Notes (Signed)
Attempted to call report; this RN contact info provided for callback 

## 2019-09-30 NOTE — ED Notes (Signed)
Spoke with MD about increased o2 need. No increased WOB, BBS clear, but SAT still 89-90% on 15L. MD does not feel the need for ABG at this time. Will continue to monitor.

## 2019-09-30 NOTE — Telephone Encounter (Signed)
Spoke with the patient's daughter: She saw [place] 3 weeks ago and she was doing okay. She continue calling every day and she has noted that she was deteriorating gradually. A week ago, she visited again, found her in her bed, apparently she has not get out of the bed for days, using the same clothing for 3 days.  She apparently urinate and defecate on the bed. The patient's daughter suspects she is drinking alcohol and not taking her medications. She needs to be evaluated in person, we both agreed that ER evaluation would be required given her general debility, the need for labs and probably IV fluids. [name] said that she will contact her father with above advice.

## 2019-09-30 NOTE — ED Notes (Signed)
EKG done and given to MD. Currently on 6LNC, SAT 91-92%

## 2019-09-30 NOTE — ED Triage Notes (Signed)
Husband states increased confusion and fatigue x 3 months, snet here today by dentist d/t 02 sat  88.

## 2019-09-30 NOTE — ED Provider Notes (Signed)
[place] EMERGENCY DEPARTMENT Provider Note   CSN: [id number] Arrival date & time: 09/30/19  1241     History Chief Complaint  Patient presents with  . Altered Mental Status    Monique Ballard is a 79 y.o. female.  79 yo F with a chief complaints of fatigue and confusion.  She also has been having a decreased oral intake and has had very little to eat and drink for the past couple weeks.  They have sent multiple messages to their family doctor but have not been seen in the office.  She has been encouraged to come to the ED multiple times with does not come here either.  She went to her dentist today for routine dental checkup and her oxygen saturation on check was in the low 80s and she was sent to the ED for evaluation.  She does not feel like she is having trouble breathing.  Denies chest pain or pressure has had a chronic cough for the past few years that she thinks has not changed.  She denies abdominal pain nausea or vomiting.  The history is provided by the patient.  Illness Severity:  Moderate Onset quality:  Gradual Duration:  2 days Timing:  Constant Progression:  Worsening Chronicity:  New Associated symptoms: no chest pain, no congestion, no fever, no headaches, no myalgias, no nausea, no rhinorrhea, no shortness of breath, no vomiting and no wheezing        Past Medical History:  Diagnosis Date  . Breast cancer ([place]) fall 1997    Left Lumpectomy, XRT.  treated with Tamoxifem and Evista  . Depression   . Endometriosis in her 20's   surgery to help clear endometriosis to obtain a pregnancy.  Marland Kitchen GERD (gastroesophageal reflux disease)    w/"stretching" remotely  . HTN (hypertension)     Patient Active Problem List   Diagnosis Date Noted  . Alcohol dependency ([place]) 05/15/2018  . Tremor 02/24/2015  . Gait disorder 02/24/2015  . PCP NOTES >>>>>>> 12/13/2014  . Abdominal wall strain 03/19/2012  . Osteopenia 10/04/2011  . Annual physical exam  09/07/2010  . GERD 08/28/2009  . Anxiety and depression 10/06/2006  . Essential hypertension 10/06/2006  . BREAST CANCER, HX OF 10/06/2006    Past Surgical History:  Procedure Laterality Date  . BREAST LUMPECTOMY Left fall 1997   Lymph nodes X 3 were negative, ER+, Radiation treatmetn.  Took Tamoxifem X 5 yrs then Evista for 3-5 yrs.  . CESAREAN SECTION     x2  first pregnancy breach and second normal  . PELVIC FRACTURE SURGERY     endometriosis, remotely     OB History    Gravida  2   Para  2   Term  2   Preterm  0   AB  0   Living  2     SAB  0   TAB  0   Ectopic  0   Multiple  0   Live Births  2           Family History  Problem Relation Age of Onset  . Hypertension Mother   . Stroke Mother   . Heart attack Father        elderly  . Heart disease Father        CHF  . Throat cancer Other   . Breast cancer Neg Hx   . Colon cancer Neg Hx   . Diabetes Neg Hx  Social History   Tobacco Use  . Smoking status: Former Research scientist (life sciences)  . Smokeless tobacco: Never Used  . Tobacco comment: Quit >30 yrs ago  Substance Use Topics  . Alcohol use: Yes    Comment: 2 glass wine dialy  . Drug use: No    Home Medications Prior to Admission medications   Medication Sig Start Date End Date Taking? Authorizing Provider  ALPRAZolam (XANAX) 0.25 MG tablet TAKE ONE OR TWO TABLETS BY MOUTH TWICE DAILY AS NEEDED FOR ANXIETY 08/02/19   Colon Branch, MD  calcium-vitamin D (OSCAL 500/200 D-3) 500-200 MG-UNIT per tablet Take 1 tablet by mouth daily.      [provider]  escitalopram (LEXAPRO) 20 MG tablet Take 1 tablet (20 mg total) by mouth daily. 02/02/19   Cottle, Billey Co., MD  metoprolol tartrate (LOPRESSOR) 50 MG tablet Take 1 tablet (50 mg total) by mouth 2 (two) times daily. 03/09/19   Colon Branch, MD  Multiple Vitamins-Minerals (CENTRUM SILVER PO) Take 1 each by mouth daily.      [provider]  OLANZapine (ZYPREXA) 7.5 MG tablet TAKE ONE TABLET  BY MOUTH DAILY AT BEDTIME  05/24/19   Cottle, Billey Co., MD  ondansetron (ZOFRAN) 8 MG tablet Take 1 tablet (8 mg total) by mouth every 8 (eight) hours as needed for nausea or vomiting. 09/21/19   Colon Branch, MD  ondansetron (ZOFRAN-ODT) 4 MG disintegrating tablet Take 4 mg by mouth every 8 (eight) hours as needed for nausea or vomiting.    [provider]  pantoprazole (PROTONIX) 40 MG tablet Take 1 tablet (40 mg total) by mouth daily. 01/30/18   Colon Branch, MD  Probiotic Product (ALIGN PO) Take by mouth daily.    [provider]    Allergies    Penicillins  Review of Systems   Review of Systems  Constitutional: Negative for chills and fever.  HENT: Negative for congestion and rhinorrhea.   Eyes: Negative for redness and visual disturbance.  Respiratory: Negative for shortness of breath and wheezing.   Cardiovascular: Negative for chest pain and palpitations.  Gastrointestinal: Negative for nausea and vomiting.  Genitourinary: Negative for dysuria and urgency.  Musculoskeletal: Negative for arthralgias and myalgias.  Skin: Negative for pallor and wound.  Neurological: Negative for dizziness and headaches.    Physical Exam Updated Vital Signs BP (!) 128/95 Comment: 6L [place]  Pulse (!) 104 Comment: 6L [place]  Temp 98.5 F (36.9 C) (Oral)   Resp (!) 29 Comment: 6L [place]  Ht 5\' 3"  (1.6 m)   Wt 63.5 kg   LMP 01/22/1992 (Within Months)   SpO2 94% Comment: 6L [place]  BMI 24.80 kg/m   Physical Exam Vitals and nursing note reviewed.  Constitutional:      General: She is not in acute distress.    Appearance: She is well-developed. She is not diaphoretic.  HENT:     Head: Normocephalic and atraumatic.  Eyes:     Pupils: Pupils are equal, round, and reactive to light.  Cardiovascular:     Rate and Rhythm: Normal rate and regular rhythm.     Heart sounds: No murmur heard.  No friction rub. No gallop.   Pulmonary:     Effort: Pulmonary effort is normal.     Breath sounds:  No wheezing or rales.  Abdominal:     General: There is no distension.     Palpations: Abdomen is soft.     Tenderness: There  is no abdominal tenderness.  Musculoskeletal:        General: No tenderness.     Cervical back: Normal range of motion and neck supple.  Skin:    General: Skin is warm and dry.  Neurological:     Mental Status: She is alert and oriented to person, place, and time.  Psychiatric:        Behavior: Behavior normal.     ED Results / Procedures / Treatments   Labs (all labs ordered are listed, but only abnormal results are displayed) Labs Reviewed  CBC WITH DIFFERENTIAL/PLATELET - Abnormal; Notable for the following components:      Result Value   WBC 12.0 (*)    MCV 100.5 (*)    MCH 35.5 (*)    Neutro Abs 8.7 (*)    Monocytes Absolute 1.4 (*)    Abs Immature Granulocytes 0.22 (*)    All other components within normal limits  COMPREHENSIVE METABOLIC PANEL - Abnormal; Notable for the following components:   Sodium 128 (*)    Potassium 2.9 (*)    Chloride 93 (*)    Glucose, Bld 129 (*)    BUN 6 (*)    Calcium 8.4 (*)    Total Protein 6.1 (*)    Albumin 3.0 (*)    AST 45 (*)    All other components within normal limits  BRAIN NATRIURETIC PEPTIDE - Abnormal; Notable for the following components:   B Natriuretic Peptide 100.1 (*)    All other components within normal limits  TROPONIN I (HIGH SENSITIVITY) - Abnormal; Notable for the following components:   Troponin I (High Sensitivity) 48 (*)    All other components within normal limits  SARS CORONAVIRUS 2 BY RT PCR [facility] ORDER, [place] [facility])  MAGNESIUM    EKG EKG Interpretation  Date/Time:  Thursday September 30 2019 13:13:46 EDT Ventricular Rate:  101 PR Interval:    QRS Duration: 98 QT Interval:  370 QTC Calculation: 480 R Axis:   -89 Text Interpretation: Sinus tachycardia Probable left atrial enlargement Left anterior fascicular block Abnormal R-wave  progression, late transition Nonspecific repol abnormality, lateral leads No old tracing to compare Confirmed by Deno Etienne [phone]) on 09/30/2019 1:31:03 PM   Radiology DG Chest Port 1 View  Result Date: 09/30/2019 CLINICAL DATA:  Hypoxia, fatigue EXAM: PORTABLE CHEST 1 VIEW COMPARISON:  05/12/2018 FINDINGS: The heart size and mediastinal contours are within normal limits. Atherosclerotic calcification of the aortic knob. Both lungs are clear. The visualized skeletal structures are unremarkable. Surgical clips in the left axillary region. IMPRESSION: No active disease. Electronically Signed   By: Davina Poke D.O.   On: 09/30/2019 14:15    Procedures Procedures (including critical care time)  Medications Ordered in ED Medications  iohexol (OMNIPAQUE) 350 MG/ML injection 100 mL (75 mLs Intravenous Contrast Given 09/30/19 1512)    ED Course  I have reviewed the triage vital signs and the nursing notes.  Pertinent labs & imaging results that were available during my care of the patient were reviewed by me and considered in my medical decision making (see chart for details).    MDM Rules/Calculators/A&P                          79 yo F with a cc of failure to thrive and fatigue.  Going on for the past few weeks.  Found to be profoundly hypoxic at the dentist office.  Requiring 6 L of oxygen here initially.  She denies any difficulty breathing.  Has clear lung sounds for me.  Has a mild leukocytosis of 12,000.  Chest x-ray viewed by me without focal infiltrate or pneumothorax.  Obtain a CT angiogram of the chest to evaluate for pulmonary embolism.  Troponin is mildly elevated BNP is also mildly elevated.  Awaiting CT scan.  Patient signed out to Dr. Sedonia Small please see his note for further details care in ED.  CRITICAL CARE Performed by: Cecilio Asper   Total critical care time: 35 minutes  Critical care time was exclusive of separately billable procedures and treating other  patients.  Critical care was necessary to treat or prevent imminent or life-threatening deterioration.  Critical care was time spent personally by me on the following activities: development of treatment plan with patient and/or surrogate as well as nursing, discussions with consultants, evaluation of patient's response to treatment, examination of patient, obtaining history from patient or surrogate, ordering and performing treatments and interventions, ordering and review of laboratory studies, ordering and review of radiographic studies, pulse oximetry and re-evaluation of patient's condition.  Final Clinical Impression(s) / ED Diagnoses Final diagnoses:  Acute respiratory failure with hypoxia [facility])    Rx / DC Orders ED Discharge Orders    None       Deno Etienne, DO 09/30/19 1517

## 2019-09-30 NOTE — ED Notes (Signed)
ED Provider at bedside. 

## 2019-09-30 NOTE — ED Notes (Signed)
Pt attempted to get out of bed, states that she has to void.  Placed pt on purewick and advised that she may void and explained to her how it works.  Pt appears in no distress at this time.

## 2019-10-01 DIAGNOSIS — J9600 Acute respiratory failure, unspecified whether with hypoxia or hypercapnia: Secondary | ICD-10-CM

## 2019-10-01 DIAGNOSIS — R778 Other specified abnormalities of plasma proteins: Secondary | ICD-10-CM | POA: Diagnosis present

## 2019-10-01 DIAGNOSIS — E876 Hypokalemia: Secondary | ICD-10-CM | POA: Diagnosis present

## 2019-10-01 DIAGNOSIS — Z20822 Contact with and (suspected) exposure to covid-19: Secondary | ICD-10-CM | POA: Diagnosis present

## 2019-10-01 DIAGNOSIS — Z87891 Personal history of nicotine dependence: Secondary | ICD-10-CM | POA: Diagnosis not present

## 2019-10-01 DIAGNOSIS — F1020 Alcohol dependence, uncomplicated: Secondary | ICD-10-CM

## 2019-10-01 DIAGNOSIS — F329 Major depressive disorder, single episode, unspecified: Secondary | ICD-10-CM

## 2019-10-01 DIAGNOSIS — J9601 Acute respiratory failure with hypoxia: Secondary | ICD-10-CM | POA: Diagnosis present

## 2019-10-01 DIAGNOSIS — Z923 Personal history of irradiation: Secondary | ICD-10-CM | POA: Diagnosis not present

## 2019-10-01 DIAGNOSIS — Z79899 Other long term (current) drug therapy: Secondary | ICD-10-CM | POA: Diagnosis not present

## 2019-10-01 DIAGNOSIS — Z853 Personal history of malignant neoplasm of breast: Secondary | ICD-10-CM | POA: Diagnosis not present

## 2019-10-01 DIAGNOSIS — K219 Gastro-esophageal reflux disease without esophagitis: Secondary | ICD-10-CM | POA: Diagnosis present

## 2019-10-01 DIAGNOSIS — R05 Cough: Secondary | ICD-10-CM | POA: Diagnosis present

## 2019-10-01 DIAGNOSIS — Z170 Estrogen receptor positive status [ER+]: Secondary | ICD-10-CM | POA: Diagnosis not present

## 2019-10-01 DIAGNOSIS — Z8249 Family history of ischemic heart disease and other diseases of the circulatory system: Secondary | ICD-10-CM | POA: Diagnosis not present

## 2019-10-01 DIAGNOSIS — I10 Essential (primary) hypertension: Secondary | ICD-10-CM | POA: Diagnosis present

## 2019-10-01 DIAGNOSIS — Z880 Allergy status to penicillin: Secondary | ICD-10-CM | POA: Diagnosis not present

## 2019-10-01 DIAGNOSIS — F419 Anxiety disorder, unspecified: Secondary | ICD-10-CM

## 2019-10-01 DIAGNOSIS — R0602 Shortness of breath: Secondary | ICD-10-CM | POA: Diagnosis present

## 2019-10-01 LAB — PHOSPHORUS: Phosphorus: 4.2 mg/dL (ref 2.5–4.6)

## 2019-10-01 LAB — TROPONIN I (HIGH SENSITIVITY)
Troponin I (High Sensitivity): 63 ng/L — ABNORMAL HIGH (ref <18)
Troponin I (High Sensitivity): 66 ng/L — ABNORMAL HIGH (ref <18)

## 2019-10-01 LAB — MAGNESIUM: Magnesium: 2.2 mg/dL (ref 1.7–2.4)

## 2019-10-01 MED ORDER — METOPROLOL TARTRATE 50 MG PO TABS
50.0000 mg | ORAL_TABLET | Freq: Two times a day (BID) | ORAL | Status: DC
  Filled 2019-10-01: qty 1

## 2019-10-01 MED ORDER — POTASSIUM CHLORIDE CRYS ER 20 MEQ PO TBCR
40.0000 meq | EXTENDED_RELEASE_TABLET | Freq: Three times a day (TID) | ORAL | Status: AC
  Administered 2019-10-01 (×3): 40 meq via ORAL
  Filled 2019-10-01 (×3): qty 2

## 2019-10-01 MED ORDER — THIAMINE HCL 100 MG PO TABS
100.0000 mg | ORAL_TABLET | Freq: Every day | ORAL | Status: DC
  Administered 2019-10-01 – 2019-10-02 (×2): 100 mg via ORAL
  Filled 2019-10-01 (×2): qty 1

## 2019-10-01 MED ORDER — CALCIUM CARBONATE-VITAMIN D 500-200 MG-UNIT PO TABS
1.0000 | ORAL_TABLET | Freq: Every day | ORAL | Status: DC
  Administered 2019-10-01 – 2019-10-02 (×2): 1 via ORAL
  Filled 2019-10-01 (×2): qty 1

## 2019-10-01 MED ORDER — PANTOPRAZOLE SODIUM 40 MG PO TBEC: 40.0000 mg | DELAYED_RELEASE_TABLET | Freq: Every day | ORAL | Status: DC

## 2019-10-01 MED ORDER — OLANZAPINE 5 MG PO TABS
7.5000 mg | ORAL_TABLET | Freq: Every day | ORAL | Status: DC
  Administered 2019-10-01: 7.5 mg via ORAL
  Filled 2019-10-01: qty 2

## 2019-10-01 MED ORDER — LORAZEPAM 1 MG PO TABS: 1.0000 mg | ORAL_TABLET | ORAL | Status: DC | PRN

## 2019-10-01 MED ORDER — CENTRUM SILVER PO TABS: ORAL_TABLET | Freq: Every day | ORAL | Status: DC

## 2019-10-01 MED ORDER — SODIUM CHLORIDE 0.9 % IV SOLN: INTRAVENOUS | Status: DC

## 2019-10-01 MED ORDER — ADULT MULTIVITAMIN W/MINERALS CH
1.0000 | ORAL_TABLET | Freq: Every day | ORAL | Status: DC
  Administered 2019-10-01 – 2019-10-02 (×2): 1 via ORAL
  Filled 2019-10-01 (×2): qty 1

## 2019-10-01 MED ORDER — THIAMINE HCL 100 MG/ML IJ SOLN: 100.0000 mg | Freq: Every day | INTRAMUSCULAR | Status: DC

## 2019-10-01 MED ORDER — FOLIC ACID 1 MG PO TABS
1.0000 mg | ORAL_TABLET | Freq: Every day | ORAL | Status: DC
  Administered 2019-10-01 – 2019-10-02 (×2): 1 mg via ORAL
  Filled 2019-10-01 (×2): qty 1

## 2019-10-01 MED ORDER — LORAZEPAM 1 MG PO TABS: 0.0000 mg | ORAL_TABLET | Freq: Two times a day (BID) | ORAL | Status: DC

## 2019-10-01 MED ORDER — METOPROLOL TARTRATE 50 MG PO TABS
50.0000 mg | ORAL_TABLET | Freq: Two times a day (BID) | ORAL | Status: DC
  Administered 2019-10-01 – 2019-10-02 (×2): 50 mg via ORAL
  Filled 2019-10-01 (×2): qty 1

## 2019-10-01 MED ORDER — ACETAMINOPHEN 650 MG RE SUPP: 650.0000 mg | Freq: Four times a day (QID) | RECTAL | Status: DC | PRN

## 2019-10-01 MED ORDER — LORAZEPAM 2 MG/ML IJ SOLN: 1.0000 mg | INTRAMUSCULAR | Status: DC | PRN

## 2019-10-01 MED ORDER — ALPRAZOLAM 0.25 MG PO TABS: 0.2500 mg | ORAL_TABLET | Freq: Two times a day (BID) | ORAL | Status: DC | PRN

## 2019-10-01 MED ORDER — LORAZEPAM 1 MG PO TABS: 0.0000 mg | ORAL_TABLET | Freq: Four times a day (QID) | ORAL | Status: DC

## 2019-10-01 MED ORDER — ESCITALOPRAM OXALATE 20 MG PO TABS: 20.0000 mg | ORAL_TABLET | Freq: Every day | ORAL | Status: DC

## 2019-10-01 MED ORDER — ACETAMINOPHEN 325 MG PO TABS: 650.0000 mg | ORAL_TABLET | Freq: Four times a day (QID) | ORAL | Status: DC | PRN

## 2019-10-01 MED ORDER — ONDANSETRON HCL 4 MG PO TABS: 4.0000 mg | ORAL_TABLET | Freq: Four times a day (QID) | ORAL | Status: DC | PRN

## 2019-10-01 MED ORDER — ENOXAPARIN SODIUM 40 MG/0.4ML ~~LOC~~ SOLN
40.0000 mg | Freq: Every day | SUBCUTANEOUS | Status: DC
  Administered 2019-10-01: 40 mg via SUBCUTANEOUS
  Filled 2019-10-01: qty 0.4

## 2019-10-01 MED ORDER — ONDANSETRON HCL 4 MG/2ML IJ SOLN: 4.0000 mg | Freq: Four times a day (QID) | INTRAMUSCULAR | Status: DC | PRN

## 2019-10-01 NOTE — Progress Notes (Signed)
Patient removed herself from Hi-Flo nasal cannula.  Patient's SPO2 is 98% to 100% on RA.  RN is aware.  RT will continue to monitor.

## 2019-10-01 NOTE — ED Notes (Signed)
Pt found asleep with [place] hanging on the siderails.  Pt spo2 is 96% on RA.  Turned off the O2 and when pt woke up she climbed up to 100% on RA.  Pt is in no distress.  Will continue to monitor.

## 2019-10-01 NOTE — ED Notes (Signed)
Pt has been resting and Vital signs have been stable since I took over care.  Pt has not required any supplemental oxygen at all during the night.  When I took over care she was on 15L NRB and when I observed consistent spo2 readings at 100% I began titrating this down, when checking on pt I saw that she had removed her [place] completely and was still spo2 in high 90's to 100%.  Pt left on RA from there.  Pt has also been able to ambulate to bathroom on RA and maintained 98% spo2.

## 2019-10-01 NOTE — Progress Notes (Signed)
Patient was able to ambulate to the bathroom.  Upon returning to her room the patient's SPO2 was 98% on RA.

## 2019-10-01 NOTE — H&P (Addendum)
History and Physical    Monique Ballard EGB:[id number] DOB: [birth year] DOA: 09/30/2019  PCP: Colon Branch, MD   Patient coming from: Home  I have personally briefly reviewed patient's old medical records in [place]  Chief Complaint: Shortness of breath  HPI: Monique Ballard is a 79 y.o. female with medical history significant for GERD, depression and hypertension.  She was sent to the emergency room via EMS after she had gone to the dentist for routine dental checkup and was noted to have pulse oximetry in the low 80s.  Patient states that she has been feeling unwell for a couple of days and states that she has had shortness of breath but is unable to tell me if it is at rest or with exertion.  She has a chronic cough that is unchanged and denies having any sputum production.  She denies having any chest pain, no fever, no chills, no nausea, no vomiting, no congestion. Patient required oxygen supplementation while in the emergency room and was initially placed on 15 L of oxygen to maintain pulse oximetry greater than 92%.  While in the ER they started titrating her oxygen down and were able to get her to 6 L of oxygen via nasal cannula.  Overnight while patient was asleep her nasal cannula dislodged and on room air she had a pulse oximetry of 96% and did not have to be placed back on oxygen. Labs arterial blood gas shows a pH of 7.5, PCO2 24, PO2 41, bicarb 21, O2 sat 85 Sodium 131, potassium 2.7, chloride 93, bicarb 24, BUN 6, creatinine 0.74, magnesium 1.9, alk phos 75, AST 45, ALT 33, BNP 100, troponin 48, white count 12 with a left shift , hemoglobin 15, hematocrit 44 CT angiogram shows no evidence of pulmonary emboli Chest x-ray reviewed by me shows no infiltrate or effusion Twelve-lead EKG reviewed by me shows sinus tachycardia    ED Course: Patient is a 79 year old Caucasian female who was sent to the ER from her dentist's office for evaluation of hypoxia. She was said to have  room air pulse oximetry in the low 80's and required oxygen supplementation to improve her pulse oximetry.  She was placed on oxygen supplementation and initially started out at 15 L but has been weaned down to room air.  Patient received IV Solu-Medrol 125 mg and bronchodilator therapy in the ER.  Labs reveal hypokalemia and slightly elevated troponin levels.  She will be referred to observation status for further evaluation.  Review of Systems: As per HPI otherwise 10 point review of systems negative.    Past Medical History:  Diagnosis Date  . Breast cancer ([place]) fall 1997    Left Lumpectomy, XRT.  treated with Tamoxifem and Evista  . Depression   . Endometriosis in her 20's   surgery to help clear endometriosis to obtain a pregnancy.  Marland Kitchen GERD (gastroesophageal reflux disease)    w/"stretching" remotely  . HTN (hypertension)     Past Surgical History:  Procedure Laterality Date  . BREAST LUMPECTOMY Left fall 1997   Lymph nodes X 3 were negative, ER+, Radiation treatmetn.  Took Tamoxifem X 5 yrs then Evista for 3-5 yrs.  . CESAREAN SECTION     x2  first pregnancy breach and second normal  . PELVIC FRACTURE SURGERY     endometriosis, remotely     reports that she has quit smoking. She has never used smokeless tobacco. She reports current alcohol use. She reports that  she does not use drugs.  Allergies  Allergen Reactions  . Penicillins Rash    Has patient had a PCN reaction causing immediate rash, facial/tongue/throat swelling, SOB or lightheadedness with hypotension: Yes Has patient had a PCN reaction causing severe rash involving mucus membranes or skin necrosis: No Has patient had a PCN reaction that required hospitalization No Has patient had a PCN reaction occurring within the last 10 years: No If all of the above answers are "NO", then may proceed with Cephalosporin use.     Family History  Problem Relation Age of Onset  . Hypertension Mother   . Stroke Mother   .  Heart attack Father        elderly  . Heart disease Father        CHF  . Throat cancer Other   . Breast cancer Neg Hx   . Colon cancer Neg Hx   . Diabetes Neg Hx      Prior to Admission medications   Medication Sig Start Date End Date Taking? Authorizing Provider  ALPRAZolam (XANAX) 0.25 MG tablet TAKE ONE OR TWO TABLETS BY MOUTH TWICE DAILY AS NEEDED FOR ANXIETY 08/02/19   Colon Branch, MD  calcium-vitamin D (OSCAL 500/200 D-3) 500-200 MG-UNIT per tablet Take 1 tablet by mouth daily.      [provider]  escitalopram (LEXAPRO) 20 MG tablet Take 1 tablet (20 mg total) by mouth daily. 02/02/19   Cottle, Billey Co., MD  metoprolol tartrate (LOPRESSOR) 50 MG tablet Take 1 tablet (50 mg total) by mouth 2 (two) times daily. 03/09/19   Colon Branch, MD  Multiple Vitamins-Minerals (CENTRUM SILVER PO) Take 1 each by mouth daily.      [provider]  OLANZapine (ZYPREXA) 7.5 MG tablet TAKE ONE TABLET BY MOUTH DAILY AT BEDTIME  05/24/19   Cottle, Billey Co., MD  ondansetron (ZOFRAN) 8 MG tablet Take 1 tablet (8 mg total) by mouth every 8 (eight) hours as needed for nausea or vomiting. 09/21/19   Colon Branch, MD  ondansetron (ZOFRAN-ODT) 4 MG disintegrating tablet Take 4 mg by mouth every 8 (eight) hours as needed for nausea or vomiting.    [provider]  pantoprazole (PROTONIX) 40 MG tablet Take 1 tablet (40 mg total) by mouth daily. 01/30/18   Colon Branch, MD  Probiotic Product (ALIGN PO) Take by mouth daily.    [provider]    Physical Exam: Vitals:   10/01/19 0700 10/01/19 0904 10/01/19 1005 10/01/19 1015  BP: 101/63 120/77    Pulse: 71 88    Resp: 18 16    Temp:  98.7 F (37.1 C)    TempSrc:  Oral    SpO2: 95% 96% 90% 93%  Weight:      Height:         Vitals:   10/01/19 0700 10/01/19 0904 10/01/19 1005 10/01/19 1015  BP: 101/63 120/77    Pulse: 71 88    Resp: 18 16    Temp:  98.7 F (37.1 C)    TempSrc:  Oral    SpO2: 95% 96% 90% 93%    Weight:      Height:        Constitutional: NAD, alert and oriented x 3 Eyes: PERRL, lids and conjunctivae normal ENMT: Mucous membranes are dry Neck: normal, supple, no masses, no thyromegaly Respiratory: clear to auscultation bilaterally, no wheezing, no crackles. Normal respiratory effort. No accessory muscle use.  Cardiovascular:  Regular rate and rhythm, no murmurs / rubs / gallops. No extremity edema. 2+ pedal pulses. No carotid bruits.  Abdomen: no tenderness, no masses palpated. No hepatosplenomegaly. Bowel sounds positive.  Musculoskeletal: no clubbing / cyanosis. No joint deformity upper and lower extremities.  Skin: no rashes, lesions, ulcers.  Neurologic: No gross focal neurologic deficit. Psychiatric: Normal mood and affect.   Labs on Admission: I have personally reviewed following labs and imaging studies  CBC: Recent Labs  Lab 09/30/19 1353 09/30/19 1812  WBC 12.0*  --   NEUTROABS 8.7*  --   HGB 14.7 15.0  HCT 41.6 44.0  MCV 100.5*  --   PLT 355  --    Basic Metabolic Panel: Recent Labs  Lab 09/30/19 1353 09/30/19 1812  NA 128* 131*  K 2.9* 2.7*  CL 93*  --   CO2 24  --   GLUCOSE 129*  --   BUN 6*  --   CREATININE 0.74  --   CALCIUM 8.4*  --   MG 1.9  --    GFR: Estimated Creatinine Clearance: 53.6 mL/min (by C-G formula based on SCr of 0.74 mg/dL). Liver Function Tests: Recent Labs  Lab 09/30/19 1353  AST 45*  ALT 33  ALKPHOS 75  BILITOT 0.9  PROT 6.1*  ALBUMIN 3.0*   No results for input(s): LIPASE, AMYLASE in the last 168 hours. No results for input(s): AMMONIA in the last 168 hours. Coagulation Profile: No results for input(s): INR, PROTIME in the last 168 hours. Cardiac Enzymes: No results for input(s): CKTOTAL, CKMB, CKMBINDEX, TROPONINI in the last 168 hours. BNP (last 3 results) No results for input(s): PROBNP in the last 8760 hours. HbA1C: No results for input(s): HGBA1C in the last 72 hours. CBG: No results for input(s):  GLUCAP in the last 168 hours. Lipid Profile: No results for input(s): CHOL, HDL, LDLCALC, TRIG, CHOLHDL, LDLDIRECT in the last 72 hours. Thyroid Function Tests: No results for input(s): TSH, T4TOTAL, FREET4, T3FREE, THYROIDAB in the last 72 hours. Anemia Panel: No results for input(s): VITAMINB12, FOLATE, FERRITIN, TIBC, IRON, RETICCTPCT in the last 72 hours. Urine analysis: No results found for: COLORURINE, APPEARANCEUR, LABSPEC, PHURINE, GLUCOSEU, HGBUR, BILIRUBINUR, KETONESUR, PROTEINUR, UROBILINOGEN, NITRITE, LEUKOCYTESUR  Radiological Exams on Admission: CT Angio Chest PE W and/or Wo Contrast  Result Date: 09/30/2019 CLINICAL DATA:  Hypoxia and shortness of breath EXAM: CT ANGIOGRAPHY CHEST WITH CONTRAST TECHNIQUE: Multidetector CT imaging of the chest was performed using the standard protocol during bolus administration of intravenous contrast. Multiplanar CT image reconstructions and MIPs were obtained to evaluate the vascular anatomy. CONTRAST:  58m OMNIPAQUE IOHEXOL 350 MG/ML SOLN COMPARISON:  None. FINDINGS: Cardiovascular: No cardiac enlargement is noted. Thoracic aorta demonstrates mild atherosclerotic calcifications without aneurysmal dilatation. The pulmonary artery shows a normal branching pattern. No filling defects to suggest pulmonary emboli are identified. Mediastinum/Nodes: Thoracic inlet is within normal limits. No hilar or mediastinal adenopathy is noted. Esophagus as visualized is within normal limits with the exception of a small hiatal hernia. Lungs/Pleura: Lungs are well aerated bilaterally. No focal infiltrate or sizable effusion is seen. Nodular thickening is noted along the major fissure on the left best seen on image number 43 of series 7 likely representing a Peri fissural lymph node. No other significant nodular changes are noted. No focal infiltrate or sizable effusion is seen. Upper Abdomen: Visualized upper abdomen demonstrates a geographic area of decreased  attenuation in the left lateral segment likely representing differential perfusion. No other focal abnormality  in the upper abdomen is noted. Musculoskeletal: Degenerative changes of the thoracic spine are seen. No acute rib abnormality is noted. Review of the MIP images confirms the above findings. IMPRESSION: No evidence of pulmonary emboli. Small Peri fissural lymph node on the left. Geographic area of decreased attenuation in the left lobe of the liver likely related to differential perfusion. Electronically Signed   By: Inez Catalina M.D.   On: 09/30/2019 15:40   DG Chest Port 1 View  Result Date: 09/30/2019 CLINICAL DATA:  Hypoxia, fatigue EXAM: PORTABLE CHEST 1 VIEW COMPARISON:  05/12/2018 FINDINGS: The heart size and mediastinal contours are within normal limits. Atherosclerotic calcification of the aortic knob. Both lungs are clear. The visualized skeletal structures are unremarkable. Surgical clips in the left axillary region. IMPRESSION: No active disease. Electronically Signed   By: Davina Poke D.O.   On: 09/30/2019 14:15    EKG: Independently reviewed.  Sinus tachycardia  Assessment/Plan Principal Problem:   Acute respiratory failure (HCC) Active Problems:   Alcohol dependency (HCC)   Anxiety and depression   Essential hypertension   Hypokalemia       Acute respiratory failure Unclear etiology Patient was noted to have room air pulse oximetry in the low 80s at an outpatient appointment and was referred to the ER Patient does not have a known history of COPD She initially had a high oxygen requirement and was on as high as 15 L of oxygen and eventually weaned down to room air Upon arrival to [place], at rest she had room air pulse oximetry of 89 to 90% and with ambulation pulse oximetry remained at 93% Patient had a CT angiogram of the chest which is negative for PE and chest x-ray did not show any acute findings We will monitor patient closely over the next 24  hours   Hypokalemia Most likely secondary to poor oral intake Will supplement potassium and obtain magnesium levels   Alcohol dependence Patient admits to daily alcohol use but denies having symptoms of alcohol withdrawal when she does not drink We will place patient on lorazepam per alcohol withdrawal protocol and will administer for CIWA score of 8 or greater    Anxiety and Depression Continue Lexapro, alprazolam and Zyprexa   Hypertension Continue metoprolol    DVT prophylaxis: Lovenox Code Status: Full code Family Communication: Greater than 50% of time was spent discussing plan of care with patient at the bedside.  She verbalizes understanding and agrees with the plan. Disposition Plan: Back to previous home environment Consults called: None  Patient is admitted to an inpatient status due to electrolyte abnormalities that require close monitoring and repeat labs in a.m. to ensure resolution.  Snigdha Howser MD [facility]     10/01/2019, 10:30 AM

## 2019-10-01 NOTE — ED Notes (Signed)
Pt had pulled off monitoring and purewick and was standing at the side of the bed.  Pt helped to put shoes on and ambulate to the bathroom to void.  Pt reports no sob and tolerates ambulating to the bathroom to void without difficulties.

## 2019-10-01 NOTE — Plan of Care (Signed)

## 2019-10-01 NOTE — ED Notes (Signed)
Attempted to call family , no answer. Pt is transferred to [facility] .

## 2019-10-02 LAB — CBC
HCT: 35.4 % — ABNORMAL LOW (ref 36.0–46.0)
Hemoglobin: 12.2 g/dL (ref 12.0–15.0)
MCH: 36.3 pg — ABNORMAL HIGH (ref 26.0–34.0)
MCHC: 34.5 g/dL (ref 30.0–36.0)
MCV: 105.4 fL — ABNORMAL HIGH (ref 80.0–100.0)
Platelets: 347 10*3/uL (ref 150–400)
RBC: 3.36 MIL/uL — ABNORMAL LOW (ref 3.87–5.11)
RDW: 13.2 % (ref 11.5–15.5)
WBC: 10.1 10*3/uL (ref 4.0–10.5)
nRBC: 0 % (ref 0.0–0.2)

## 2019-10-02 LAB — BASIC METABOLIC PANEL
Anion gap: 9 (ref 5–15)
BUN: 7 mg/dL — ABNORMAL LOW (ref 8–23)
CO2: 21 mmol/L — ABNORMAL LOW (ref 22–32)
Calcium: 8.3 mg/dL — ABNORMAL LOW (ref 8.9–10.3)
Chloride: 108 mmol/L (ref 98–111)
Creatinine, Ser: 0.58 mg/dL (ref 0.44–1.00)
GFR calc Af Amer: >60 mL/min (ref >60)
GFR calc non Af Amer: >60 mL/min (ref >60)
Glucose, Bld: 88 mg/dL (ref 70–99)
Potassium: 3.9 mmol/L (ref 3.5–5.1)
Sodium: 138 mmol/L (ref 135–145)

## 2019-10-02 LAB — C DIFFICILE QUICK SCREEN W PCR REFLEX
C Diff antigen: NEGATIVE
C Diff interpretation: NOT DETECTED
C Diff toxin: NEGATIVE

## 2019-10-04 ENCOUNTER — Other Ambulatory Visit: Payer: Self-pay

## 2019-10-04 ENCOUNTER — Telehealth: Payer: Self-pay

## 2019-10-04 MED ORDER — PANTOPRAZOLE SODIUM 40 MG PO TBEC
40.0000 mg | DELAYED_RELEASE_TABLET | Freq: Every day | ORAL | 3 refills | Status: DC
Start: 2019-10-04 — End: 2020-07-25

## 2019-10-04 NOTE — Telephone Encounter (Signed)
Patient's daughter returned Kims call, Benita Gutter, [phone]. Stated the patient possibly needs home health care. Would like to speak with Dr. Larose Kells or nurse.

## 2019-10-04 NOTE — Telephone Encounter (Signed)
Transition Care Management Unsuccessful Follow-up Telephone Call  Date of discharge and from where:  10/02/2019; [facility]  Attempts:  1st Attempt  Reason for unsuccessful TCM follow-up call:  Left voice message

## 2019-10-04 NOTE — Telephone Encounter (Signed)
Caller states that her mother has been in the hospital since yesterday and is going to be discharged today. She is wanting to know what DR Larose Kells thinks about ordering a home health aid to come out and help her shower, get dressed and take meds.   Telephone: [phone]

## 2019-10-04 NOTE — Telephone Encounter (Signed)
Dr. Larose Kells- please see message below. Also, please let me know when Pt should be scheduled for hosp f/u.

## 2019-10-05 NOTE — Telephone Encounter (Signed)
Spoke with patients daughter, [name].   Transition Care Management Follow-up Telephone Call  Admission: 09/30/2019-10/02/2019 Diagnosis: Acute respiratory failure with hypoxia   How have you been since you were released from the hospital? Daughter reports respiratory status is improved, pulse ox remaining around 94-95% on room air.    Do you understand why you were in the hospital? yes   Do you understand the discharge instructions? yes   Where were you discharged to? Home. Resides with husband.    Items Reviewed:  Medications reviewed: no, did not have meds/list at time of call.   Allergies reviewed: no  Dietary changes reviewed: yes  Referrals reviewed: yes. Daughter requesting [facility] services for ADLs/Meds. She states patient has cognitively declined and is concerned about dementia. Examples provided: patient is not cleaning herself and is confused by time of day.    Functional Questionnaire:   Activities of Daily Living (ADLs):   She states they are independent in the following: ambulation, bathing and hygiene, feeding, continence, grooming, toileting and dressing. Daughter is not clear how thorough hygiene/toileting is.  States they require assistance with the following: None.    Any transportation issues/concerns?: no   Any patient concerns? yes, daughter requesting [facility] services.    Confirmed importance and date/time of follow-up visits scheduled yes  Provider Appointment booked with PCP on Friday, 10/08/2019.   Confirmed with patient if condition begins to worsen call PCP or go to the ER.  Patient was given the office number and encouraged to call back with question or concerns.  : yes

## 2019-10-05 NOTE — Discharge Summary (Signed)
[facility] Discharge Summary   Patient: Monique Ballard ZDG:[id number]  PCP: Colon Branch, MD  Date of admission: 09/30/2019   Date of discharge: 10/02/2019      Discharge Diagnoses:   Principal Problem:   Acute respiratory failure [facility]) Active Problems:   Anxiety and depression   Essential hypertension   Alcohol dependency ([place])   Hypokalemia   Admitted From: home Disposition:  Home   Recommendations for Outpatient Follow-up:  1. PCP: please follow up with PCP in 1 week 2. Follow up LABS/TEST:  none   Follow-up Information    Colon Branch, MD. Schedule an appointment as soon as possible for a visit in 1 week(s).   Specialty: Internal Medicine Contact information: [address] [place] [address] [phone]              Diet recommendation: Cardiac diet  Activity: The patient is advised to gradually reintroduce usual activities, as tolerated  Discharge Condition: stable  Code Status: Full code   History of present illness: As per the H and P dictated on admission, "Monique Ballard is a 79 y.o. female with medical history significant for GERD, depression and hypertension.  She was sent to the emergency room via EMS after she had gone to the dentist for routine dental checkup and was noted to have pulse oximetry in the low 80s.  Patient states that she has been feeling unwell for a couple of days and states that she has had shortness of breath but is unable to tell me if it is at rest or with exertion.  She has a chronic cough that is unchanged and denies having any sputum production.  She denies having any chest pain, no fever, no chills, no nausea, no vomiting, no congestion. Patient required oxygen supplementation while in the emergency room and was initially placed on 15 L of oxygen to maintain pulse oximetry greater than 92%.  While in the ER they started titrating her oxygen down and were able to get her to 6 L of oxygen via nasal cannula.    Overnight while patient was asleep her nasal cannula dislodged and on room air she had a pulse oximetry of 96% and did not have to be placed back on oxygen. Labs arterial blood gas shows a pH of 7.5, PCO2 24, PO2 41, bicarb 21, O2 sat 85 Sodium 131, potassium 2.7, chloride 93, bicarb 24, BUN 6, creatinine 0.74, magnesium 1.9, alk phos 75, AST 45, ALT 33, BNP 100, troponin 48, white count 12 with a left shift , hemoglobin 15, hematocrit 44 CT angiogram shows no evidence of pulmonary emboli Chest x-ray reviewed by me shows no infiltrate or effusion Twelve-lead EKG reviewed by me shows sinus tachycardia    ED Course: Patient is a 79 year old Caucasian female who was sent to the ER from her dentist's office for evaluation of hypoxia. She was said to have room air pulse oximetry in the low 80's and required oxygen supplementation to improve her pulse oximetry.  She was placed on oxygen supplementation and initially started out at 15 L but has been weaned down to room air.  Patient received IV Solu-Medrol 125 mg and bronchodilator therapy in the ER.  Labs reveal hypokalemia and slightly elevated troponin levels.  She will be referred to observation status for further evaluation."  Hospital Course:   Summary of her active problems in the hospital is as following.   Acute respiratory failure POA resolved now Unclear  etiology Patient was noted to have room air pulse oximetry in the low 80s at an outpatient appointment and was referred to the ER. Patient does not have a known history of COPD She initially had a high oxygen requirement and was on as high as 15 L of oxygen and eventually weaned down to room air Patient had a CT angiogram of the chest which is negative for PE and chest x-ray did not show any acute findings Clinical exam is negative.  Ambulatory sats are normal. Pt does not have any complains.   Hypokalemia Most likely secondary to poor oral intake Replaced   Alcohol  dependence Patient admits to daily alcohol use but denies having symptoms of alcohol withdrawal when she does not drink  Anxiety and Depression Continue Lexapro, alprazolam and Zyprexa  Hypertension Continue metoprolol  Patient was ambulatory without any assistance. On the day of the discharge the patient's vitals were stable, and no other acute medical condition were reported by patient. the patient was felt safe to be discharge at Home with no therapy needed on discharge.  Consultants: none Procedures: none  Discharge Exam: General: Appear in no distress, no Rash; Oral Mucosa Clear, moist. Cardiovascular: S1 and S2 Present, no Murmur, Respiratory: normal respiratory effort, Bilateral Air entry present and no Crackles, no wheezes Abdomen: Bowel Sound present, Soft and no tenderness, no hernia Extremities: no Pedal edema, no calf tenderness Neurology: alert and oriented to time, place, and person affect appropriate.  Filed Weights   09/30/19 1306  Weight: 63.5 kg   Vitals:   10/02/19 0001 10/02/19 0606  BP: 114/82 122/76  Pulse: 79 81  Resp:  18  Temp: 98.1 F (36.7 C) 97.9 F (36.6 C)  SpO2: 97% 98%    DISCHARGE MEDICATION: Allergies as of 10/02/2019      Reactions   Penicillins Rash   Has patient had a PCN reaction causing immediate rash, facial/tongue/throat swelling, SOB or lightheadedness with hypotension: Yes Has patient had a PCN reaction causing severe rash involving mucus membranes or skin necrosis: No Has patient had a PCN reaction that required hospitalization No Has patient had a PCN reaction occurring within the last 10 years: No If all of the above answers are "NO", then may proceed with Cephalosporin use.      Medication List    TAKE these medications   ALPRAZolam 0.25 MG tablet Commonly known as: XANAX TAKE ONE OR TWO TABLETS BY MOUTH TWICE DAILY AS NEEDED FOR ANXIETY What changed: See the new instructions.   escitalopram 20 MG  tablet Commonly known as: LEXAPRO Take 1 tablet (20 mg total) by mouth daily.   metoprolol tartrate 50 MG tablet Commonly known as: LOPRESSOR Take 1 tablet (50 mg total) by mouth 2 (two) times daily.   OLANZapine 7.5 MG tablet Commonly known as: ZYPREXA TAKE ONE TABLET BY MOUTH DAILY AT BEDTIME   ondansetron 8 MG tablet Commonly known as: ZOFRAN Take 1 tablet (8 mg total) by mouth every 8 (eight) hours as needed for nausea or vomiting.      Allergies  Allergen Reactions  . Penicillins Rash    Has patient had a PCN reaction causing immediate rash, facial/tongue/throat swelling, SOB or lightheadedness with hypotension: Yes Has patient had a PCN reaction causing severe rash involving mucus membranes or skin necrosis: No Has patient had a PCN reaction that required hospitalization No Has patient had a PCN reaction occurring within the last 10 years: No If all of the above answers are "NO",  then may proceed with Cephalosporin use.    Discharge Instructions    Diet - low sodium heart healthy   Complete by: As directed    Discharge instructions   Complete by: As directed    It is important that you read the given instructions as well as go over your medication list with RN to help you understand your care after this hospitalization.  Please follow-up with PCP in 1-2 weeks.  Please note that NO REFILLS for any discharge medications will be authorized once you are discharged, as it is imperative that you return to your primary care physician (or establish a relationship with a primary care physician if you do not have one) for your aftercare needs so that they can reassess your need for medications and monitor your lab values.  Please request your primary care physician to go over all Hospital Tests and Procedure/Radiological results at the follow up. Please get all Hospital records sent to your PCP by signing hospital release before you go home.   Do not drive, operating heavy  machinery, perform activities at heights, swimming or participation in water activities or provide baby sitting services; until you have been seen by Primary Care Physician or a Neurologist and are cleared to do such activities.  Do not take more than prescribed Pain, Sleep and Anxiety Medications.  You were cared for by a hospitalist during your hospital stay. If you have any questions about your discharge medications or the care you received while you were in the hospital after you are discharged, you can call the unit '@UNIT' @ you were admitted to and ask to speak with the hospitalist Berle Mull. Ask for Hospitalist on call if the hospitalist that took care of you is not available.   Once you are discharged, your primary care physician will handle any further medical issues.  You Must read complete instructions/literature along with all the possible adverse reactions/side effects for all the Medicines you take and that have been prescribed to you. Take any new Medicines after you have completely understood and accept all the possible adverse reactions/side effects. If you have smoked or chewed Tobacco in the last 2 yrs please STOP smoking   Increase activity slowly   Complete by: As directed       The results of significant diagnostics from this hospitalization (including imaging, microbiology, ancillary and laboratory) are listed below for reference.    Significant Diagnostic Studies: CT Angio Chest PE W and/or Wo Contrast  Result Date: 09/30/2019 CLINICAL DATA:  Hypoxia and shortness of breath EXAM: CT ANGIOGRAPHY CHEST WITH CONTRAST TECHNIQUE: Multidetector CT imaging of the chest was performed using the standard protocol during bolus administration of intravenous contrast. Multiplanar CT image reconstructions and MIPs were obtained to evaluate the vascular anatomy. CONTRAST:  1m OMNIPAQUE IOHEXOL 350 MG/ML SOLN COMPARISON:  None. FINDINGS: Cardiovascular: No cardiac enlargement is noted.  Thoracic aorta demonstrates mild atherosclerotic calcifications without aneurysmal dilatation. The pulmonary artery shows a normal branching pattern. No filling defects to suggest pulmonary emboli are identified. Mediastinum/Nodes: Thoracic inlet is within normal limits. No hilar or mediastinal adenopathy is noted. Esophagus as visualized is within normal limits with the exception of a small hiatal hernia. Lungs/Pleura: Lungs are well aerated bilaterally. No focal infiltrate or sizable effusion is seen. Nodular thickening is noted along the major fissure on the left best seen on image number 43 of series 7 likely representing a Peri fissural lymph node. No other significant nodular changes are noted. No focal  infiltrate or sizable effusion is seen. Upper Abdomen: Visualized upper abdomen demonstrates a geographic area of decreased attenuation in the left lateral segment likely representing differential perfusion. No other focal abnormality in the upper abdomen is noted. Musculoskeletal: Degenerative changes of the thoracic spine are seen. No acute rib abnormality is noted. Review of the MIP images confirms the above findings. IMPRESSION: No evidence of pulmonary emboli. Small Peri fissural lymph node on the left. Geographic area of decreased attenuation in the left lobe of the liver likely related to differential perfusion. Electronically Signed   By: Inez Catalina M.D.   On: 09/30/2019 15:40   DG Chest Port 1 View  Result Date: 09/30/2019 CLINICAL DATA:  Hypoxia, fatigue EXAM: PORTABLE CHEST 1 VIEW COMPARISON:  05/12/2018 FINDINGS: The heart size and mediastinal contours are within normal limits. Atherosclerotic calcification of the aortic knob. Both lungs are clear. The visualized skeletal structures are unremarkable. Surgical clips in the left axillary region. IMPRESSION: No active disease. Electronically Signed   By: Davina Poke D.O.   On: 09/30/2019 14:15    Microbiology: Recent Results (from the  past 240 hour(s))  SARS Coronavirus 2 by RT PCR (hospital order, performed in [facility] hospital lab) Nasopharyngeal Nasopharyngeal Swab     Status: None   Collection Time: 09/30/19  1:53 PM   Specimen: Nasopharyngeal Swab  Result Value Ref Range Status   SARS Coronavirus 2 NEGATIVE NEGATIVE Final    Comment: (NOTE) SARS-CoV-2 target nucleic acids are NOT DETECTED.  The SARS-CoV-2 RNA is generally detectable in upper and lower respiratory specimens during the acute phase of infection. The lowest concentration of SARS-CoV-2 viral copies this assay can detect is 250 copies / mL. A negative result does not preclude SARS-CoV-2 infection and should not be used as the sole basis for treatment or other patient management decisions.  A negative result may occur with improper specimen collection / handling, submission of specimen other than nasopharyngeal swab, presence of viral mutation(s) within the areas targeted by this assay, and inadequate number of viral copies (<250 copies / mL). A negative result must be combined with clinical observations, patient history, and epidemiological information.  Fact Sheet for Patients:   StrictlyIdeas.no  Fact Sheet for Healthcare Providers: BankingDealers.co.za  This test is not yet approved or  cleared by the [place] FDA and has been authorized for detection and/or diagnosis of SARS-CoV-2 by FDA under an Emergency Use Authorization (EUA).  This EUA will remain in effect (meaning this test can be used) for the duration of the COVID-19 declaration under Section 564(b)(1) of the Act, 21 U.S.C. section 360bbb-3(b)(1), unless the authorization is terminated or revoked sooner.  Performed at [facility], [address] [place], [place] [address]   C Difficile Quick Screen w PCR reflex     Status: None   Collection Time: 10/02/19 10:25 AM   Specimen: STOOL  Result Value Ref Range Status    C Diff antigen NEGATIVE NEGATIVE Final   C Diff toxin NEGATIVE NEGATIVE Final   C Diff interpretation No C. difficile detected.  Final    Comment: Performed at [facility], [place] [address], [place], [place] [address]     Labs: CBC: Recent Labs  Lab 09/30/19 1353 09/30/19 1812 10/02/19 0713  WBC 12.0*  --  10.1  NEUTROABS 8.7*  --   --   HGB 14.7 15.0 12.2  HCT 41.6 44.0 35.4*  MCV 100.5*  --  105.4*  PLT 355  --  972   Basic Metabolic Panel: Recent Labs  Lab 09/30/19 1353 09/30/19 1812 10/01/19 1035 10/02/19 0713  NA 128* 131*  --  138  K 2.9* 2.7*  --  3.9  CL 93*  --   --  108  CO2 24  --   --  21*  GLUCOSE 129*  --   --  88  BUN 6*  --   --  7*  CREATININE 0.74  --   --  0.58  CALCIUM 8.4*  --   --  8.3*  MG 1.9  --  2.2  --   PHOS  --   --  4.2  --    Liver Function Tests: Recent Labs  Lab 09/30/19 1353  AST 45*  ALT 33  ALKPHOS 75  BILITOT 0.9  PROT 6.1*  ALBUMIN 3.0*   No results for input(s): LIPASE, AMYLASE in the last 168 hours. No results for input(s): AMMONIA in the last 168 hours. Cardiac Enzymes: No results for input(s): CKTOTAL, CKMB, CKMBINDEX, TROPONINI in the last 168 hours. BNP (last 3 results) Recent Labs    09/30/19 1353  BNP 100.1*   CBG: No results for input(s): GLUCAP in the last 168 hours.  Time spent: 35 minutes  Signed:  Berle Mull  [facility] 10/02/2019

## 2019-10-08 ENCOUNTER — Other Ambulatory Visit: Payer: Self-pay

## 2019-10-08 ENCOUNTER — Ambulatory Visit (INDEPENDENT_AMBULATORY_CARE_PROVIDER_SITE_OTHER): Payer: Medicare Other | Admitting: Internal Medicine

## 2019-10-08 ENCOUNTER — Encounter: Payer: Self-pay | Admitting: Internal Medicine

## 2019-10-08 VITALS — BP 99/69 | HR 74 | Temp 97.8°F | Ht 62.0 in | Wt 140.4 lb

## 2019-10-08 DIAGNOSIS — R0902 Hypoxemia: Secondary | ICD-10-CM | POA: Diagnosis not present

## 2019-10-08 DIAGNOSIS — G629 Polyneuropathy, unspecified: Secondary | ICD-10-CM

## 2019-10-08 DIAGNOSIS — R269 Unspecified abnormalities of gait and mobility: Secondary | ICD-10-CM | POA: Diagnosis not present

## 2019-10-08 DIAGNOSIS — F1020 Alcohol dependence, uncomplicated: Secondary | ICD-10-CM

## 2019-10-08 DIAGNOSIS — F419 Anxiety disorder, unspecified: Secondary | ICD-10-CM

## 2019-10-08 DIAGNOSIS — J9600 Acute respiratory failure, unspecified whether with hypoxia or hypercapnia: Secondary | ICD-10-CM

## 2019-10-08 DIAGNOSIS — F329 Major depressive disorder, single episode, unspecified: Secondary | ICD-10-CM

## 2019-10-08 DIAGNOSIS — R2681 Unsteadiness on feet: Secondary | ICD-10-CM | POA: Diagnosis not present

## 2019-10-08 DIAGNOSIS — Z79899 Other long term (current) drug therapy: Secondary | ICD-10-CM

## 2019-10-08 NOTE — Patient Instructions (Addendum)
Decrease metoprolol to half tablet twice a day  Check the  blood pressure 2 or 3 times a week BP GOAL is between 110/65 and  135/85. If it is consistently higher or lower, let me know  Start taking multivitamin daily  We are referring you to neurology  GO TO THE LAB : Get the blood work     [place], [place] back for a checkup in 4 weeks

## 2019-10-08 NOTE — Progress Notes (Signed)
Pre visit review using our clinic review tool, if applicable. No additional management support is needed unless otherwise documented below in the visit note. 

## 2019-10-08 NOTE — Progress Notes (Signed)
Subjective:    Patient ID: Monique Ballard, female    DOB: 1940/06/17, 79 y.o.   MRN: [id number]  DOS:  10/08/2019 Type of visit - description: TCM Admitted to the hospital discharge 10/02/2019. See my phone note from 09/28/2019, prior to admission, the patient was gradually deteriorating, not getting out of bed, daughter suspected she was not eating much and was drinking alcohol. At the ER she was found to be hypoxic requiring  oxygen via nasal cannula. CT angiogram no pulmonary emboli, chest x-ray with no infiltrates.  EKG sinus tachycardia. She received Solu-Medrol and bronchodilator therapy. Respiratory failure resolved. Had hypokalemia that resolved Troponin was slightly elevated   Wt Readings from Last 3 Encounters:  10/08/19 140 lb 6 oz (63.7 kg)  09/30/19 140 lb (63.5 kg)  02/01/19 150 lb (68 kg)     Review of Systems Since she left the hospital, she is home under the care of her family. She admits drinking alcohol prior to admission, and staying in bed mostly because she was very tired and short of breath. No chest pain.  Since she left the hospital, she is somewhat better, still reports DOE. She reports unsteadiness, needing a cane Denies lower extremity paresthesias. Again no chest pain no palpitation Denies nausea, vomiting, blood in the stools but admits to occasional diarrhea. Denies depression  Past Medical History:  Diagnosis Date  . Breast cancer ([place]) fall 1997    Left Lumpectomy, XRT.  treated with Tamoxifem and Evista  . Depression   . Endometriosis in her 20's   surgery to help clear endometriosis to obtain a pregnancy.  Marland Kitchen GERD (gastroesophageal reflux disease)    w/"stretching" remotely  . HTN (hypertension)     Past Surgical History:  Procedure Laterality Date  . BREAST LUMPECTOMY Left fall 1997   Lymph nodes X 3 were negative, ER+, Radiation treatmetn.  Took Tamoxifem X 5 yrs then Evista for 3-5 yrs.  . CESAREAN SECTION     x2  first  pregnancy breach and second normal  . PELVIC FRACTURE SURGERY     endometriosis, remotely    Allergies as of 10/08/2019      Reactions   Penicillins Rash   Has patient had a PCN reaction causing immediate rash, facial/tongue/throat swelling, SOB or lightheadedness with hypotension: Yes Has patient had a PCN reaction causing severe rash involving mucus membranes or skin necrosis: No Has patient had a PCN reaction that required hospitalization No Has patient had a PCN reaction occurring within the last 10 years: No If all of the above answers are "NO", then may proceed with Cephalosporin use.      Medication List       Accurate as of October 08, 2019 11:59 PM. If you have any questions, ask your nurse or doctor.        ALPRAZolam 0.25 MG tablet Commonly known as: XANAX TAKE ONE OR TWO TABLETS BY MOUTH TWICE DAILY AS NEEDED FOR ANXIETY What changed: See the new instructions.   escitalopram 20 MG tablet Commonly known as: LEXAPRO Take 1 tablet (20 mg total) by mouth daily.   metoprolol tartrate 50 MG tablet Commonly known as: LOPRESSOR Take 0.5 tablets (25 mg total) by mouth 2 (two) times daily. What changed: how much to take Changed by: Kathlene November, MD   OLANZapine 7.5 MG tablet Commonly known as: ZYPREXA TAKE ONE TABLET BY MOUTH DAILY AT BEDTIME   ondansetron 8 MG tablet Commonly known as: ZOFRAN Take 1 tablet (8 mg  total) by mouth every 8 (eight) hours as needed for nausea or vomiting.   pantoprazole 40 MG tablet Commonly known as: PROTONIX Take 1 tablet (40 mg total) by mouth daily.          Objective:   Physical Exam BP 99/69 (BP Location: Left Arm, Patient Position: Sitting, Cuff Size: Small)   Pulse 74   Temp 97.8 F (36.6 C) (Oral)   Ht 5\' 2"  (1.575 m)   Wt 140 lb 6 oz (63.7 kg)   LMP 01/22/1992 (Within Months)   SpO2 93%   BMI 25.67 kg/m   General:   Well developed, NAD, BMI noted.  Somewhat frail appearing HEENT:  Normocephalic . Face symmetric,  atraumatic Lungs:  CTA B Normal respiratory effort, no intercostal retractions, no accessory muscle use. Heart: RRR,  no murmur.  Abdomen:  Not distended, soft, non-tender. No rebound or rigidity.   Skin: Not pale. Not jaundice Lower extremities: no pretibial edema bilaterally good pedal pulses Neurologic:  alert & oriented X3.  Speech normal, gait appropriate for age and unassisted.  DTR symmetrically decreased.  Pinprick examination: Patchy distal insensitivity Psych--  Cognition and judgment appear intact.  Cooperative with normal attention span and concentration.  Behavior appropriate. No anxious or depressed appearing.     Assessment     Assessment HTN Depression, Anxiety - Dr Clovis Pu  Tremor dx [phone] Gait d/o: unsteady onset ~ 2017 GERD with esophageal stricture L Breast cancer, 1997, lumpectomy, XRT, released from oncology ETOH  PLAN: TCM 14 Hypoxia: Recently admitted to the hospital, found to be hypoxic, CT chest (-) for PE, chest x-ray with no infiltrate, EKG sinus tachycardia, troponins were slightly elevated but she did not have chest pain (no cardiac work-up pursued). Today her O2 sat is 93% and did not drop with ambulation.  She continue with DOE. Plan: Echo , reassess in 4 weeks, consider pulmonary eval. HTN: BP today is in the low side, check BMP/CBC, decrease metoprolol to half tablet twice daily, monitor BPs. Gait disorder: The patient reports that her balance is definitely worse since the hospital admission, I suspect neuropathy, denies paresthesias but on pinprick examination there are areas of insensitivity.  See next Neuropathy: Possibly due to EtOH, recommend abstinence, will send home health for PT, check a T26, folic acid and thiamine.  Start a multi-vitamin.  Refer to neurology, w/u?  Continue using a cane. Diarrhea: She reported diarrhea a few weeks ago, things are getting slightly better, monitor your on RTC EtOH: Strongly recommend complete  abstinence Depression: Denies depression at this point, on meds. RTC 4 weeks   This visit occurred during the SARS-CoV-2 public health emergency.  Safety protocols were in place, including screening questions prior to the visit, additional usage of staff PPE, and extensive cleaning of exam room while observing appropriate contact time as indicated for disinfecting solutions.

## 2019-10-10 NOTE — Assessment & Plan Note (Signed)
TCM 14 Hypoxia: Recently admitted to the hospital, found to be hypoxic, CT chest (-) for PE, chest x-ray with no infiltrate, EKG sinus tachycardia, troponins were slightly elevated but she did not have chest pain (no cardiac work-up pursued). Today her O2 sat is 93% and did not drop with ambulation.  She continue with DOE. Plan: Echo , reassess in 4 weeks, consider pulmonary eval. HTN: BP today is in the low side, check BMP/CBC, decrease metoprolol to half tablet twice daily, monitor BPs. Gait disorder: The patient reports that her balance is definitely worse since the hospital admission, I suspect neuropathy, denies paresthesias but on pinprick examination there are areas of insensitivity.  See next Neuropathy: Possibly due to EtOH, recommend abstinence, will send home health for PT, check a O24, folic acid and thiamine.  Start a multi-vitamin.  Refer to neurology, w/u?  Continue using a cane. Diarrhea: She reported diarrhea a few weeks ago, things are getting slightly better, monitor your on RTC EtOH: Strongly recommend complete abstinence Depression: Denies depression at this point, on meds. RTC 4 weeks

## 2019-10-12 ENCOUNTER — Other Ambulatory Visit: Payer: Self-pay

## 2019-10-12 ENCOUNTER — Other Ambulatory Visit (INDEPENDENT_AMBULATORY_CARE_PROVIDER_SITE_OTHER): Payer: Medicare Other

## 2019-10-12 DIAGNOSIS — F329 Major depressive disorder, single episode, unspecified: Secondary | ICD-10-CM | POA: Diagnosis not present

## 2019-10-12 DIAGNOSIS — F1020 Alcohol dependence, uncomplicated: Secondary | ICD-10-CM

## 2019-10-12 DIAGNOSIS — F32A Depression, unspecified: Secondary | ICD-10-CM

## 2019-10-12 DIAGNOSIS — F419 Anxiety disorder, unspecified: Secondary | ICD-10-CM | POA: Diagnosis not present

## 2019-10-12 DIAGNOSIS — Z79899 Other long term (current) drug therapy: Secondary | ICD-10-CM | POA: Diagnosis not present

## 2019-10-12 DIAGNOSIS — G629 Polyneuropathy, unspecified: Secondary | ICD-10-CM

## 2019-10-13 LAB — CBC WITH DIFFERENTIAL/PLATELET
Absolute Monocytes: 1649 cells/uL — ABNORMAL HIGH (ref 200–950)
Basophils Absolute: 53 cells/uL (ref 0–200)
Basophils Relative: 0.5 %
Eosinophils Absolute: 42 cells/uL (ref 15–500)
Eosinophils Relative: 0.4 %
HCT: 42.9 % (ref 35.0–45.0)
Hemoglobin: 14.6 g/dL (ref 11.7–15.5)
Lymphs Abs: 1596 cells/uL (ref 850–3900)
MCH: 34.8 pg — ABNORMAL HIGH (ref 27.0–33.0)
MCHC: 34 g/dL (ref 32.0–36.0)
MCV: 102.1 fL — ABNORMAL HIGH (ref 80.0–100.0)
MPV: 10.2 fL (ref 7.5–12.5)
Monocytes Relative: 15.7 %
Neutro Abs: 7161 cells/uL (ref 1500–7800)
Neutrophils Relative %: 68.2 %
Platelets: 284 10*3/uL (ref 140–400)
RBC: 4.2 10*6/uL (ref 3.80–5.10)
RDW: 12.2 % (ref 11.0–15.0)
Total Lymphocyte: 15.2 %
WBC: 10.5 10*3/uL (ref 3.8–10.8)

## 2019-10-13 LAB — BASIC METABOLIC PANEL
BUN/Creatinine Ratio: 8 (calc) (ref 6–22)
BUN: 6 mg/dL — ABNORMAL LOW (ref 7–25)
CO2: 23 mmol/L (ref 20–32)
Calcium: 8.4 mg/dL — ABNORMAL LOW (ref 8.6–10.4)
Chloride: 99 mmol/L (ref 98–110)
Creat: 0.71 mg/dL (ref 0.60–0.93)
Glucose, Bld: 108 mg/dL — ABNORMAL HIGH (ref 65–99)
Potassium: 4.7 mmol/L (ref 3.5–5.3)
Sodium: 131 mmol/L — ABNORMAL LOW (ref 135–146)

## 2019-10-13 LAB — B12 AND FOLATE PANEL
Folate: 1.7 ng/mL — ABNORMAL LOW
Vitamin B-12: 587 pg/mL (ref 200–1100)

## 2019-10-14 LAB — DM TEMPLATE

## 2019-10-14 LAB — DRUG MONITORING, PANEL 8 WITH CONFIRMATION, URINE
6 Acetylmorphine: NEGATIVE ng/mL (ref <10)
Alcohol Metabolites: POSITIVE ng/mL — AB
Amphetamines: NEGATIVE ng/mL (ref <500)
Benzodiazepines: NEGATIVE ng/mL (ref <100)
Buprenorphine, Urine: NEGATIVE ng/mL (ref <5)
Cocaine Metabolite: NEGATIVE ng/mL (ref <150)
Creatinine: 37.1 mg/dL
Ethyl Glucuronide (ETG): 48815 ng/mL — ABNORMAL HIGH (ref <500)
Ethyl Sulfate (ETS): 16186 ng/mL — ABNORMAL HIGH (ref <100)
MDMA: NEGATIVE ng/mL (ref <500)
Marijuana Metabolite: NEGATIVE ng/mL (ref <20)
Opiates: NEGATIVE ng/mL (ref <100)
Oxidant: NEGATIVE ug/mL
Oxycodone: NEGATIVE ng/mL (ref <100)
pH: 6 (ref 4.5–9.0)

## 2019-10-17 LAB — VITAMIN B1: Vitamin B1 (Thiamine): <6 nmol/L — ABNORMAL LOW (ref 8–30)

## 2019-10-21 ENCOUNTER — Other Ambulatory Visit: Payer: Self-pay | Admitting: Internal Medicine

## 2019-10-21 MED ORDER — THIAMINE HCL 100 MG PO TABS: 100.0000 mg | ORAL_TABLET | Freq: Every day | ORAL | 0 refills | Status: DC

## 2019-10-21 MED ORDER — FOLIC ACID 1 MG PO TABS: 1.0000 mg | ORAL_TABLET | Freq: Every day | ORAL | 6 refills | Status: DC

## 2019-10-30 ENCOUNTER — Encounter (HOSPITAL_COMMUNITY): Payer: Self-pay

## 2019-10-30 ENCOUNTER — Other Ambulatory Visit: Payer: Self-pay

## 2019-10-30 ENCOUNTER — Emergency Department (HOSPITAL_COMMUNITY): Payer: Medicare Other

## 2019-10-30 ENCOUNTER — Emergency Department (HOSPITAL_COMMUNITY)
Admission: EM | Admit: 2019-10-30 | Discharge: 2019-10-30 | Disposition: A | Discharge status: Home / Self Care | Payer: Medicare Other | Attending: Emergency Medicine | Admitting: Emergency Medicine

## 2019-10-30 DIAGNOSIS — M47812 Spondylosis without myelopathy or radiculopathy, cervical region: Secondary | ICD-10-CM | POA: Diagnosis not present

## 2019-10-30 DIAGNOSIS — Z853 Personal history of malignant neoplasm of breast: Secondary | ICD-10-CM | POA: Diagnosis not present

## 2019-10-30 DIAGNOSIS — Z87891 Personal history of nicotine dependence: Secondary | ICD-10-CM | POA: Insufficient documentation

## 2019-10-30 DIAGNOSIS — I6782 Cerebral ischemia: Secondary | ICD-10-CM | POA: Diagnosis not present

## 2019-10-30 DIAGNOSIS — W19XXXA Unspecified fall, initial encounter: Secondary | ICD-10-CM | POA: Insufficient documentation

## 2019-10-30 DIAGNOSIS — Z79899 Other long term (current) drug therapy: Secondary | ICD-10-CM | POA: Diagnosis not present

## 2019-10-30 DIAGNOSIS — Z981 Arthrodesis status: Secondary | ICD-10-CM | POA: Diagnosis not present

## 2019-10-30 DIAGNOSIS — M4802 Spinal stenosis, cervical region: Secondary | ICD-10-CM | POA: Diagnosis not present

## 2019-10-30 DIAGNOSIS — S0990XA Unspecified injury of head, initial encounter: Secondary | ICD-10-CM | POA: Diagnosis not present

## 2019-10-30 DIAGNOSIS — I10 Essential (primary) hypertension: Secondary | ICD-10-CM | POA: Diagnosis not present

## 2019-10-30 DIAGNOSIS — Y92009 Unspecified place in unspecified non-institutional (private) residence as the place of occurrence of the external cause: Secondary | ICD-10-CM | POA: Insufficient documentation

## 2019-10-30 DIAGNOSIS — R220 Localized swelling, mass and lump, head: Secondary | ICD-10-CM | POA: Diagnosis not present

## 2019-10-30 DIAGNOSIS — S0101XA Laceration without foreign body of scalp, initial encounter: Secondary | ICD-10-CM

## 2019-10-30 DIAGNOSIS — G319 Degenerative disease of nervous system, unspecified: Secondary | ICD-10-CM | POA: Diagnosis not present

## 2019-10-30 IMAGING — CT CT HEAD W/O CM
2 of 4 series · 11 of 47 positions shown, 13 images · non-contrast
Comparison: Head CT [DATE].  No prior cervical spine CTs.

CLINICAL DATA: Unwitnessed fall.  Bleeding to back of head.

EXAM:
CT HEAD WITHOUT CONTRAST
CT CERVICAL SPINE WITHOUT CONTRAST
TECHNIQUE: Multidetector CT imaging of the head and cervical spine was
performed following the standard protocol without intravenous
contrast. Multiplanar CT image reconstructions of the cervical spine
were also generated.

[Series 6: coronal soft tissue · coronal · 0.30mm/px · 3 of 68 slices shown]
[im 23/68  brain]
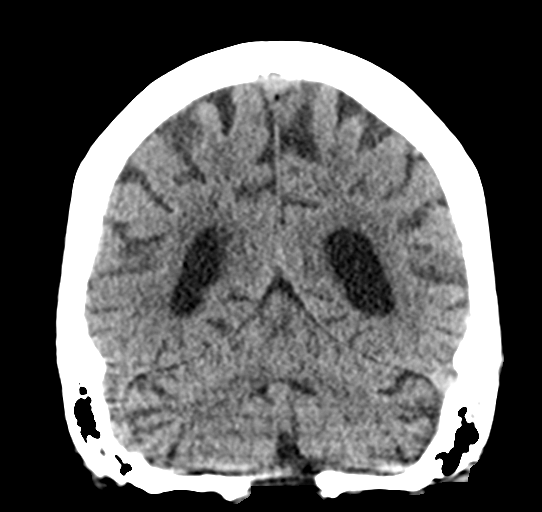
[im 30/68  brain]
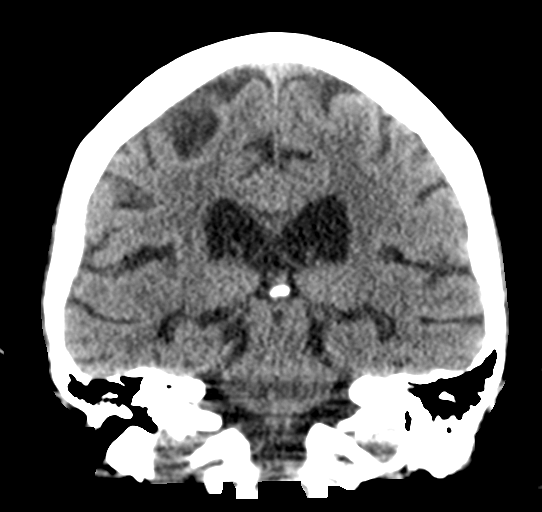
[im 38/68  brain]
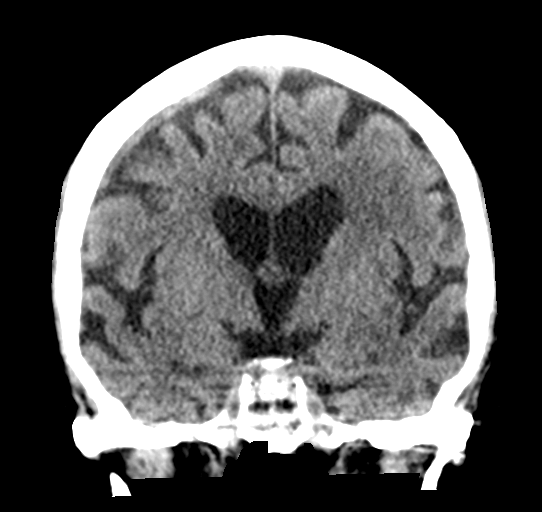

[Series 8: true axial · axial · 0.30mm/px · z∈[-140,-15]mm · 8 of 55 slices shown, 10 images]
[im 7/55  brain]
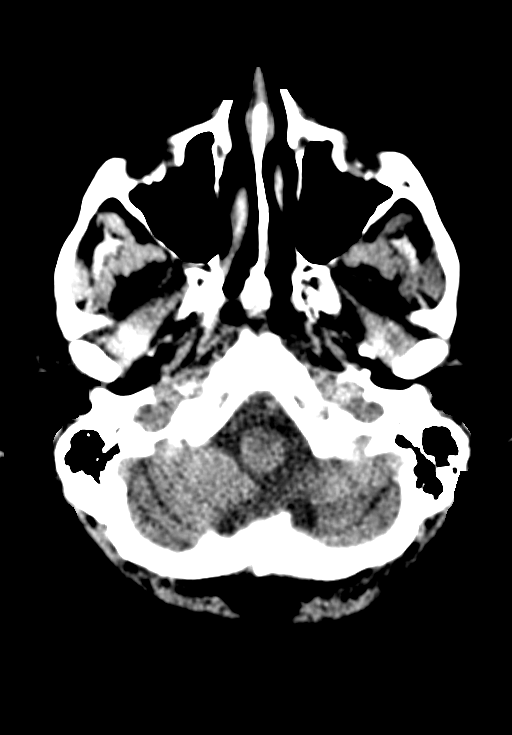
[im 7/55  bone]
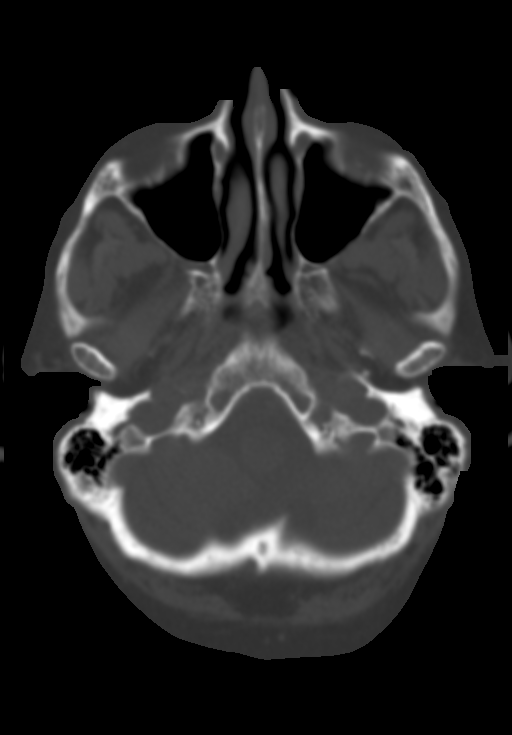
[im 13/55  brain]
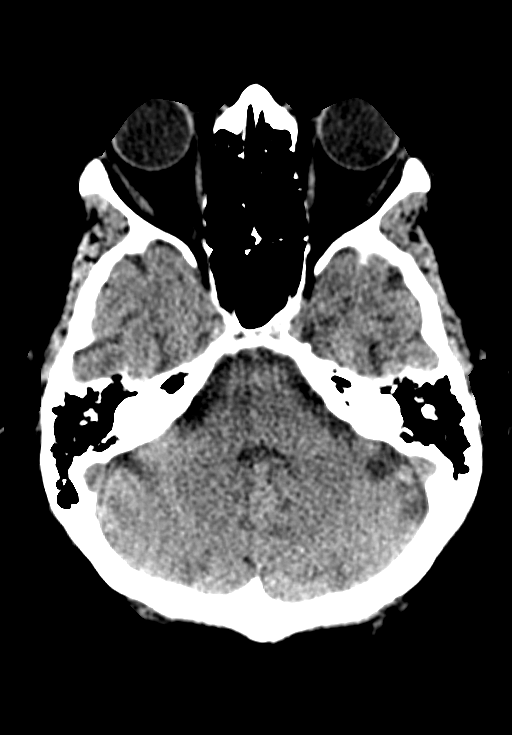
[im 19/55  brain]
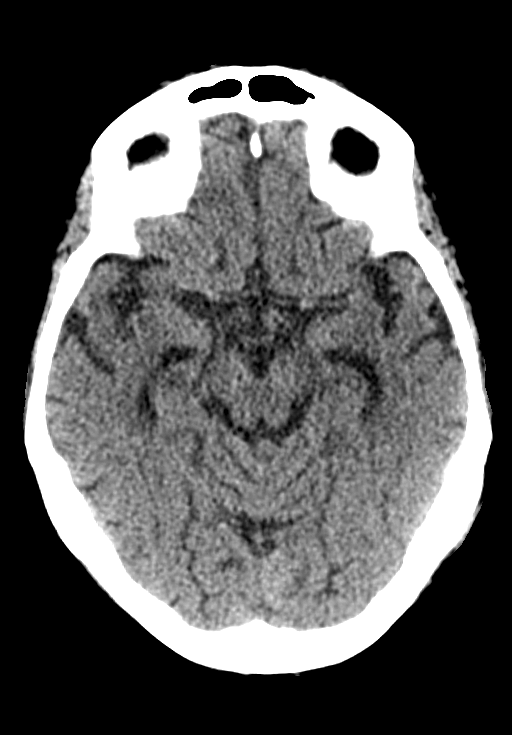
[im 25/55  brain]
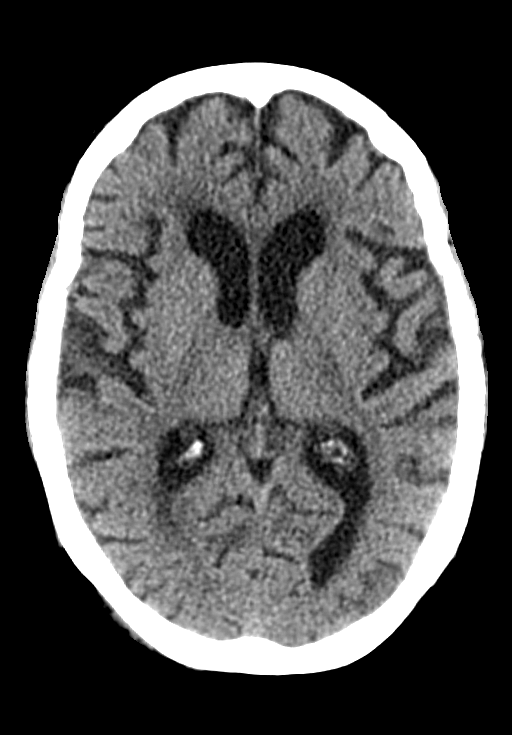
[im 31/55  brain]
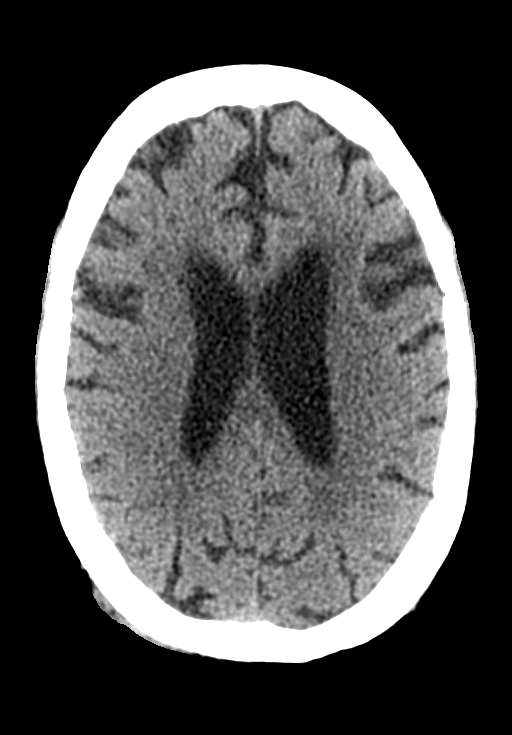
[im 31/55  bone]
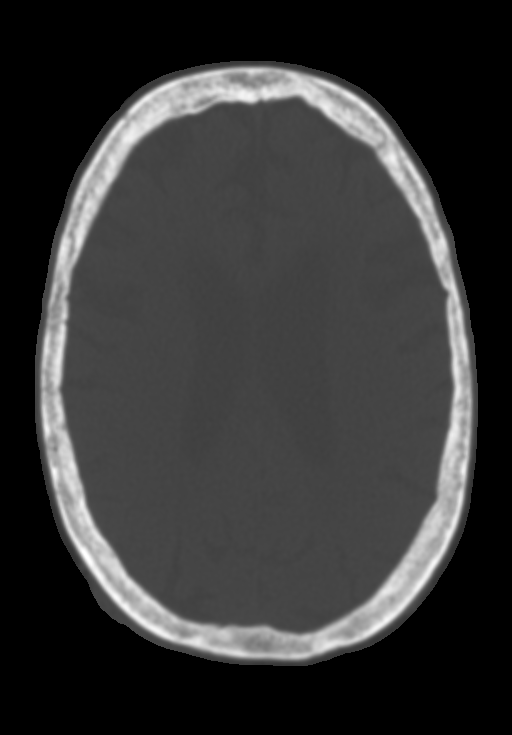
[im 37/55  brain]
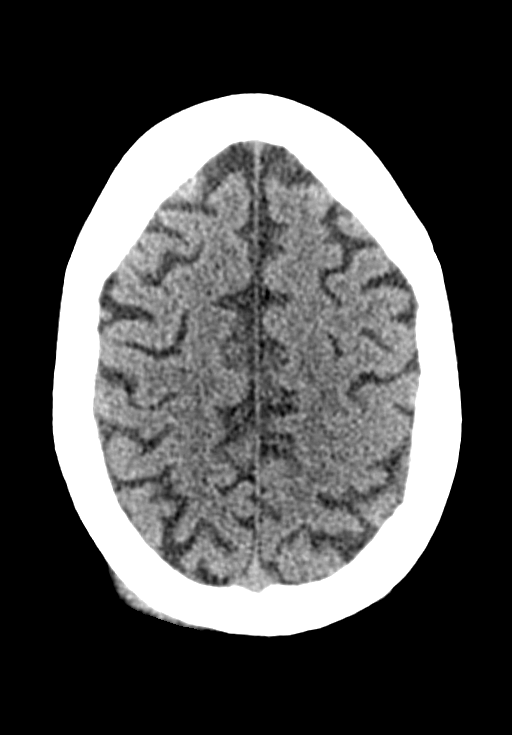
[im 43/55  brain]
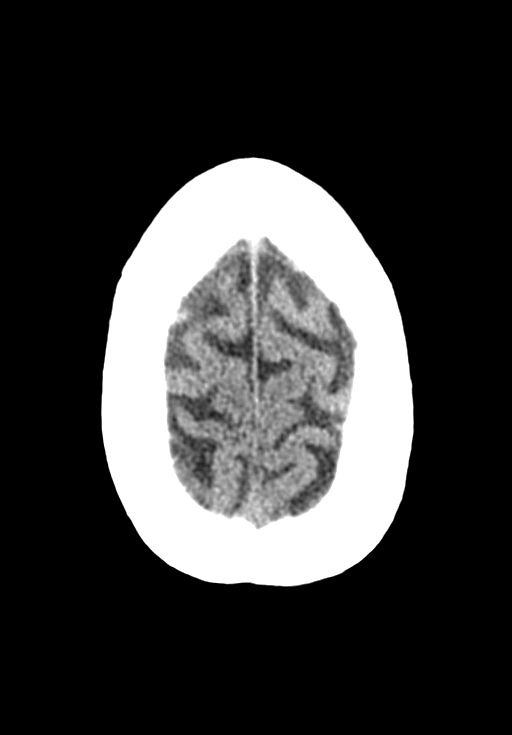
[im 49/55  brain]
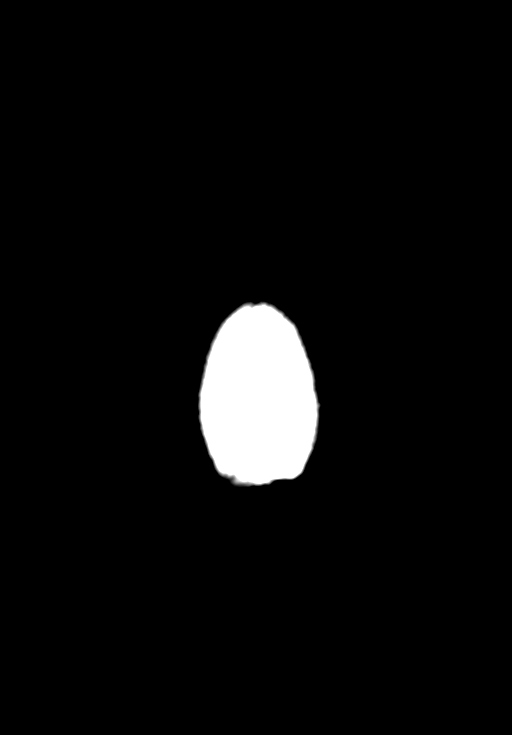

[11 of 47 positions shown; findings below may reference images not displayed]

FINDINGS: CT HEAD FINDINGS

Brain: Expected cerebral volume loss for age. Mild low density in
the periventricular white matter likely related to small vessel
disease. No mass lesion, hemorrhage, hydrocephalus, acute infarct,
intra-axial, or extra-axial fluid collection.

Vascular: No hyperdense vessel or unexpected calcification.

Skull: Mild right frontal scalp soft tissue swelling, including on
32/4. No skull fracture.

Sinuses/Orbits: Normal imaged portions of the orbits and globes.
Clear paranasal sinuses and mastoid air cells.

Other: None.

CT CERVICAL SPINE FINDINGS

Alignment: Spinal visualization through through the bottom of T1.
Motion degradation is mild, despite multiple attempts. Maintenance
of vertebral body height and alignment.

Skull base and vertebrae: Skull base intact. No acute fracture.
Facets are well aligned. Degenerative changes involve articular
facets, most significant on the left at C2-3, with there is
essential fusion.

Soft tissues and spinal canal: No prevertebral soft tissue swelling.

Disc levels: Loss of intervertebral disc height at C5-6, C6-7 and
less so C4-5. Multifactorial right neural foraminal narrowing at
C4-5, bilateral neural foraminal narrowing at C5-6 and left greater
than right neural foraminal narrowing at C6-7. Mild central canal
stenosis at C4-5 and C5-6.

Upper chest: No apical pneumothorax.

Other: None.
IMPRESSION: 1. Right frontal scalp soft tissue swelling, without acute
intracranial abnormality.
2.  Cerebral atrophy and small vessel ischemic change.
3. Mildly motion degraded evaluation of the cervical spine.
Spondylosis without acute osseous finding.

## 2019-10-30 IMAGING — CT CT CERVICAL SPINE W/O CM
4 of 9 series · 13 of 33 positions shown, 14 images · non-contrast
Comparison: Head CT [DATE].  No prior cervical spine CTs.

CLINICAL DATA: Unwitnessed fall.  Bleeding to back of head.

EXAM:
CT HEAD WITHOUT CONTRAST
CT CERVICAL SPINE WITHOUT CONTRAST
TECHNIQUE: Multidetector CT imaging of the head and cervical spine was
performed following the standard protocol without intravenous
contrast. Multiplanar CT image reconstructions of the cervical spine
were also generated.

[Series 9: orthogonal bone · axial · 0.21mm/px · z∈[-289,-198]mm · 3 of 105 slices shown, 4 images]
[im 27/105  soft-tissue]
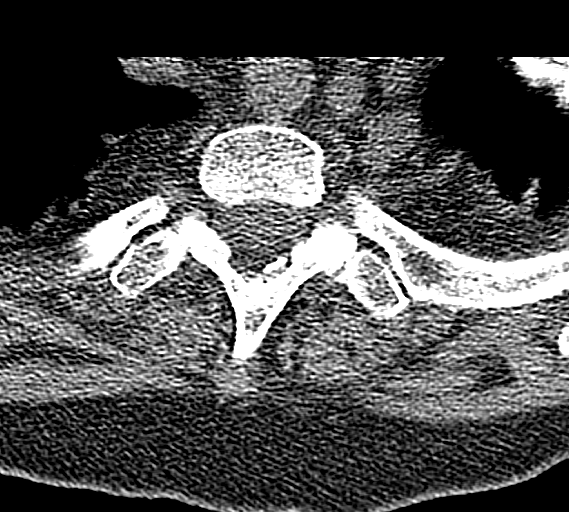
[im 27/105  bone]
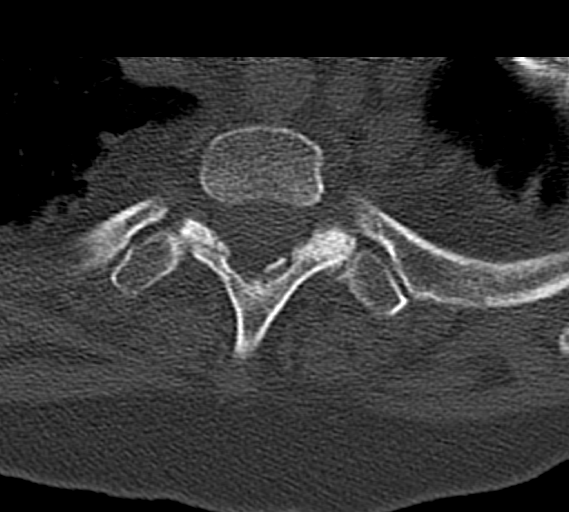
[im 53/105  bone]
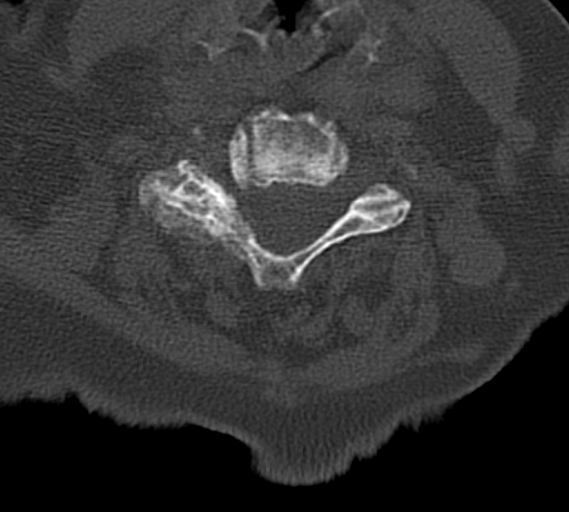
[im 79/105  bone]
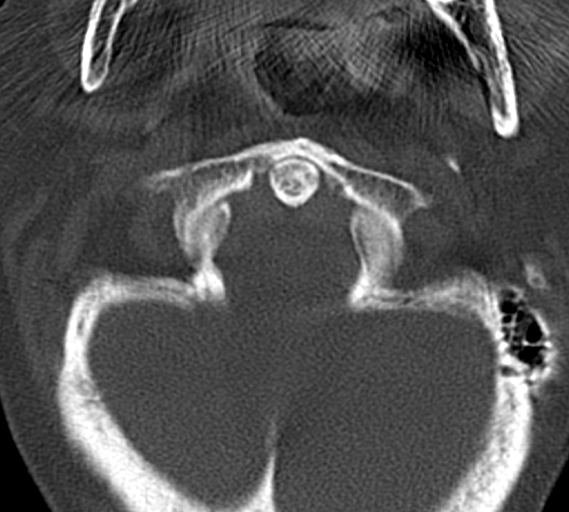

[Series 10: coronal bone · coronal · 0.23mm/px · 3 of 61 slices shown]
[im 3/61  bone]
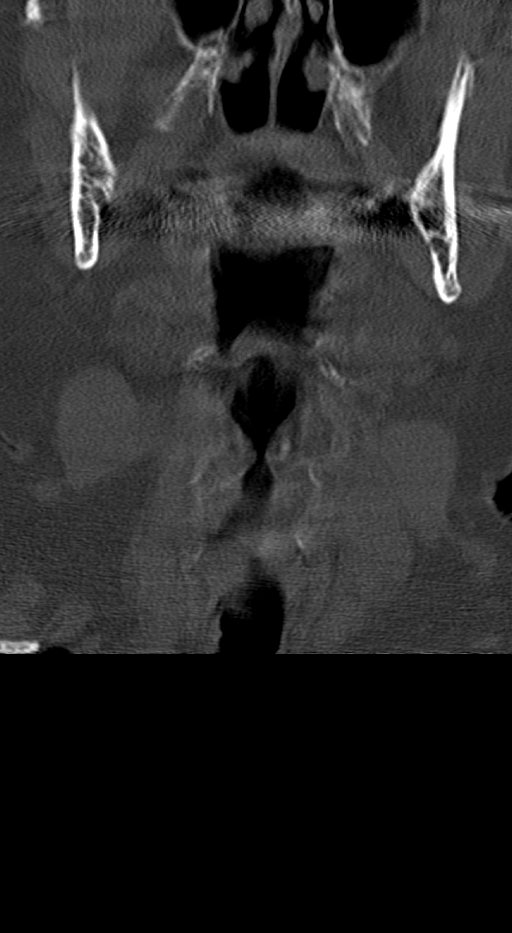
[im 31/61  bone]
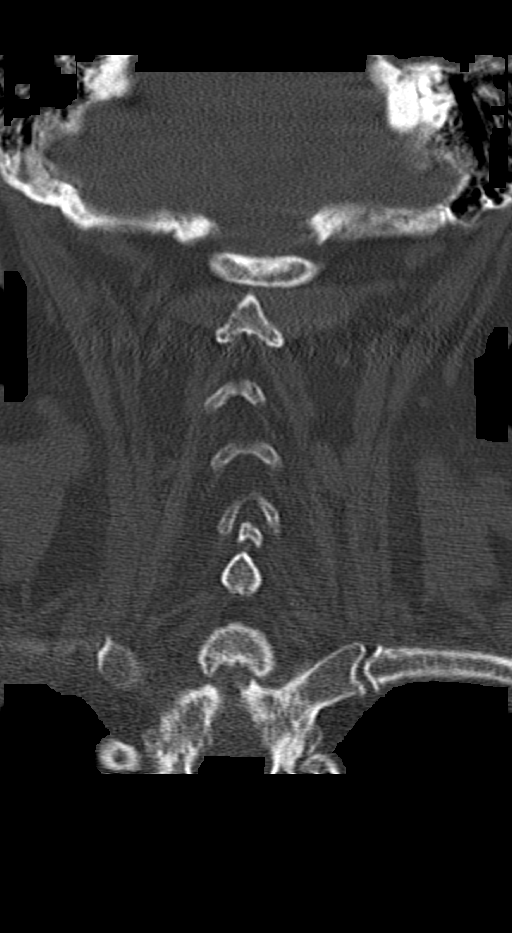
[im 59/61  bone]
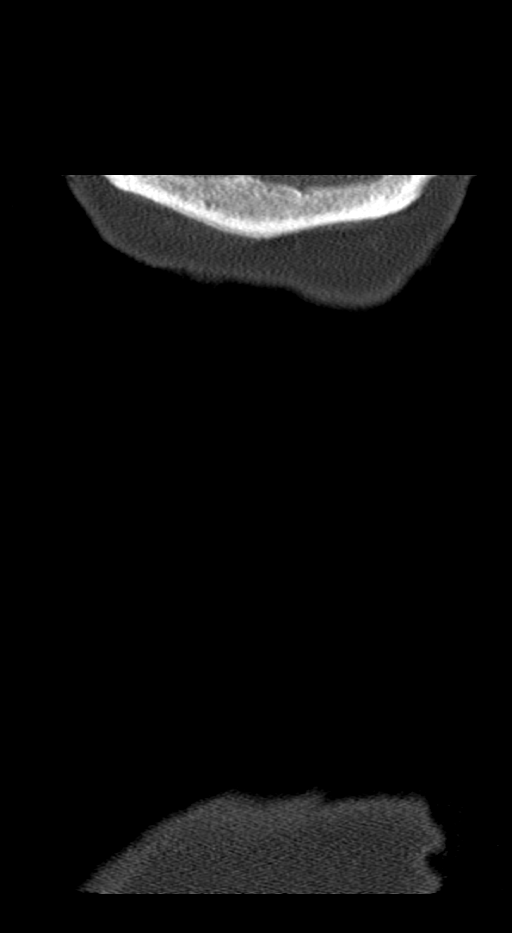

[Series 14: sagittal bone · sagittal · 0.18mm/px · 5 of 61 slices shown]
[im 11/61  bone]
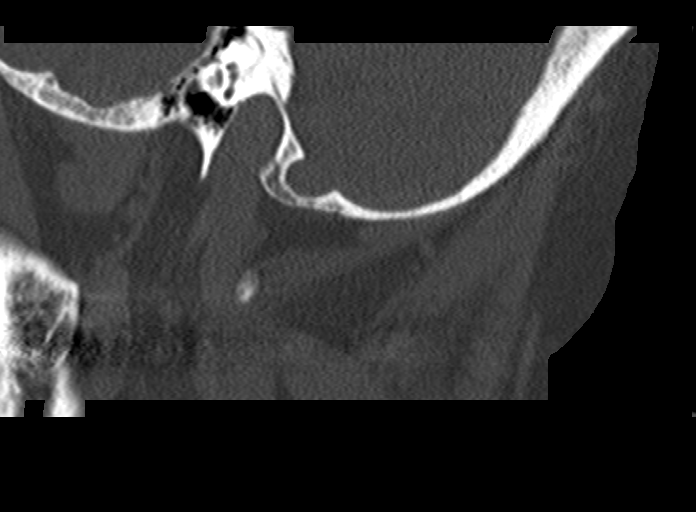
[im 21/61  bone]
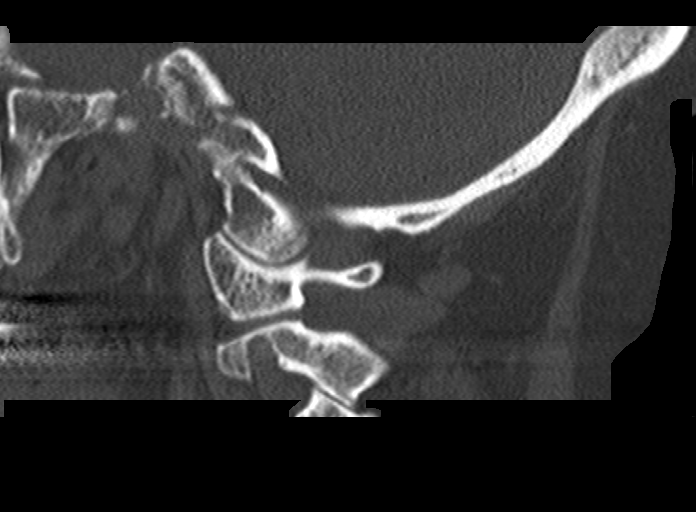
[im 31/61  bone]
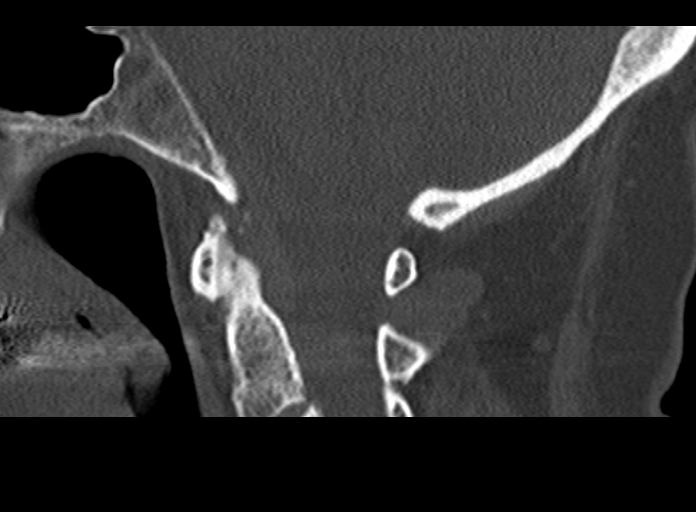
[im 41/61  bone]
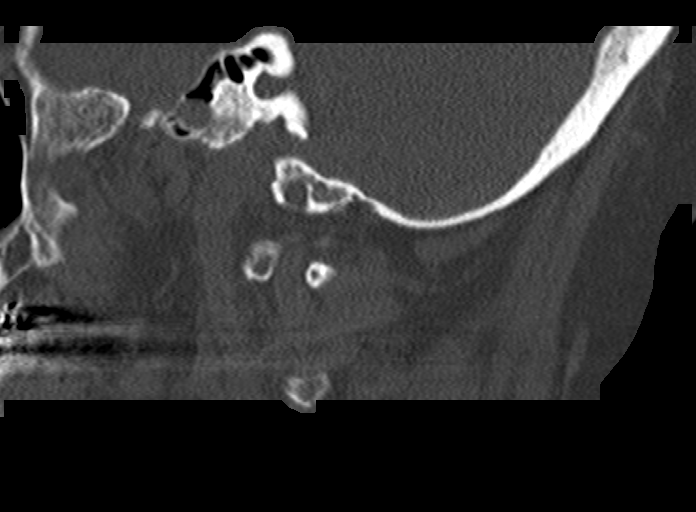
[im 51/61  bone]
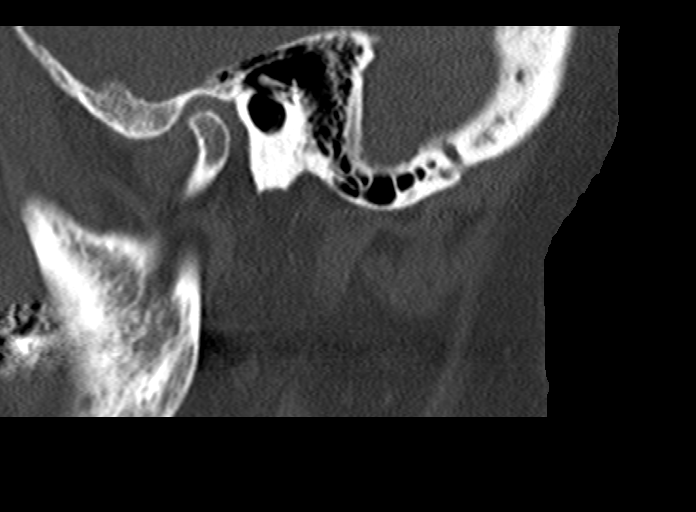

[Series 16: c spine soft · axial · 0.27mm/px · z∈[-233,-185]mm · 2 of 74 slices shown]
[im 25/74  soft-tissue]
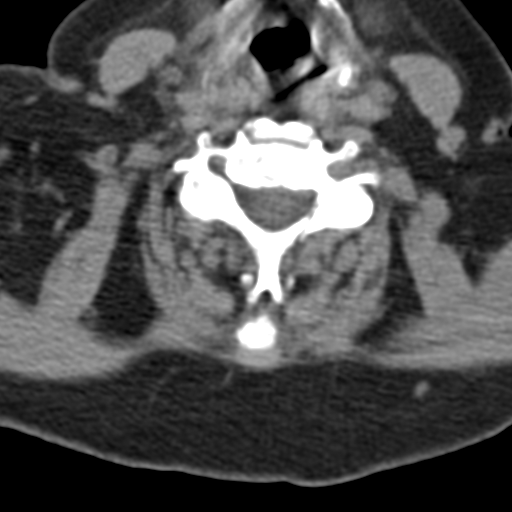
[im 49/74  soft-tissue]
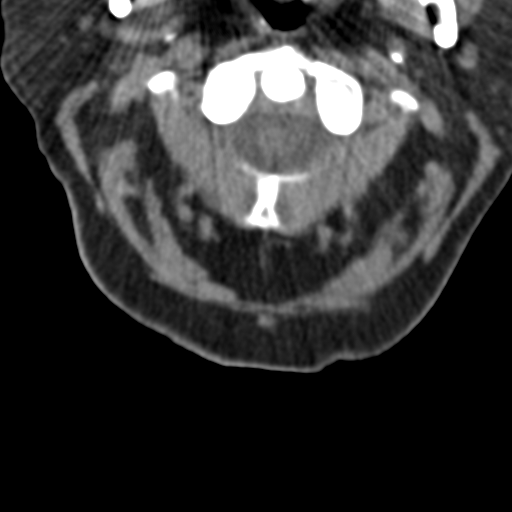

[13 of 33 positions shown; findings below may reference images not displayed]

FINDINGS: CT HEAD FINDINGS

Brain: Expected cerebral volume loss for age. Mild low density in
the periventricular white matter likely related to small vessel
disease. No mass lesion, hemorrhage, hydrocephalus, acute infarct,
intra-axial, or extra-axial fluid collection.

Vascular: No hyperdense vessel or unexpected calcification.

Skull: Mild right frontal scalp soft tissue swelling, including on
32/4. No skull fracture.

Sinuses/Orbits: Normal imaged portions of the orbits and globes.
Clear paranasal sinuses and mastoid air cells.

Other: None.

CT CERVICAL SPINE FINDINGS

Alignment: Spinal visualization through through the bottom of T1.
Motion degradation is mild, despite multiple attempts. Maintenance
of vertebral body height and alignment.

Skull base and vertebrae: Skull base intact. No acute fracture.
Facets are well aligned. Degenerative changes involve articular
facets, most significant on the left at C2-3, with there is
essential fusion.

Soft tissues and spinal canal: No prevertebral soft tissue swelling.

Disc levels: Loss of intervertebral disc height at C5-6, C6-7 and
less so C4-5. Multifactorial right neural foraminal narrowing at
C4-5, bilateral neural foraminal narrowing at C5-6 and left greater
than right neural foraminal narrowing at C6-7. Mild central canal
stenosis at C4-5 and C5-6.

Upper chest: No apical pneumothorax.

Other: None.
IMPRESSION: 1. Right frontal scalp soft tissue swelling, without acute
intracranial abnormality.
2.  Cerebral atrophy and small vessel ischemic change.
3. Mildly motion degraded evaluation of the cervical spine.
Spondylosis without acute osseous finding.

## 2019-10-30 NOTE — ED Triage Notes (Signed)
Pt report falling at home. Unwitnessed. Bleeding to back of head. Pt unsure of events and if there was LOC.

## 2019-10-30 NOTE — ED Notes (Signed)
SPO2 93% RA when ambulated

## 2019-10-30 NOTE — ED Provider Notes (Signed)
[facility] DEPT Provider Note   CSN: [id number] Arrival date & time: 10/30/19  9371     History Chief Complaint  Patient presents with  . Fall    Monique Ballard is a 79 y.o. female.  Patient with past medical history notable for alcohol dependency presents to the emergency department with a chief complaint of fall.  She states that her last drink of alcohol was last night.  She is uncertain what caused her to fall.  She sustained an injury to the back of her head.  She did not lose consciousness.  She denies pain in any pain in any of her extremities or her neck or back.  She is not anticoagulated.  The history is provided by the patient. No language interpreter was used.       Past Medical History:  Diagnosis Date  . Breast cancer ([place]) fall 1997    Left Lumpectomy, XRT.  treated with Tamoxifem and Evista  . Depression   . Endometriosis in her 20's   surgery to help clear endometriosis to obtain a pregnancy.  Marland Kitchen GERD (gastroesophageal reflux disease)    w/"stretching" remotely  . HTN (hypertension)     Patient Active Problem List   Diagnosis Date Noted  . Hypokalemia 10/01/2019  . Acute respiratory failure ([place]) 09/30/2019  . Alcohol dependency ([place]) 05/15/2018  . Tremor 02/24/2015  . Gait disorder 02/24/2015  . PCP NOTES >>>>>>> 12/13/2014  . Abdominal wall strain 03/19/2012  . Osteopenia 10/04/2011  . Annual physical exam 09/07/2010  . GERD 08/28/2009  . Anxiety and depression 10/06/2006  . Essential hypertension 10/06/2006  . BREAST CANCER, HX OF 10/06/2006    Past Surgical History:  Procedure Laterality Date  . BREAST LUMPECTOMY Left fall 1997   Lymph nodes X 3 were negative, ER+, Radiation treatmetn.  Took Tamoxifem X 5 yrs then Evista for 3-5 yrs.  . CESAREAN SECTION     x2  first pregnancy breach and second normal  . PELVIC FRACTURE SURGERY     endometriosis, remotely     OB History    Gravida  2   Para  2    Term  2   Preterm  0   AB  0   Living  2     SAB  0   TAB  0   Ectopic  0   Multiple  0   Live Births  2           Family History  Problem Relation Age of Onset  . Hypertension Mother   . Stroke Mother   . Heart attack Father        elderly  . Heart disease Father        CHF  . Throat cancer Other   . Breast cancer Neg Hx   . Colon cancer Neg Hx   . Diabetes Neg Hx     Social History   Tobacco Use  . Smoking status: Former Research scientist (life sciences)  . Smokeless tobacco: Never Used  . Tobacco comment: Quit >30 yrs ago  Substance Use Topics  . Alcohol use: Yes    Comment: 2 glass wine dialy  . Drug use: No    Home Medications Prior to Admission medications   Medication Sig Start Date End Date Taking? Authorizing Provider  ALPRAZolam (XANAX) 0.25 MG tablet TAKE ONE OR TWO TABLETS BY MOUTH TWICE DAILY AS NEEDED FOR ANXIETY Patient taking differently: Take 0.25-0.5 mg by mouth 2 (two) times  daily as needed for anxiety.  08/02/19   Colon Branch, MD  escitalopram (LEXAPRO) 20 MG tablet Take 1 tablet (20 mg total) by mouth daily. 02/02/19   Cottle, Billey Co., MD  folic acid (FOLVITE) 1 MG tablet Take 1 tablet (1 mg total) by mouth daily. 10/21/19   Colon Branch, MD  metoprolol tartrate (LOPRESSOR) 50 MG tablet Take 0.5 tablets (25 mg total) by mouth 2 (two) times daily. 10/08/19   Colon Branch, MD  OLANZapine (ZYPREXA) 7.5 MG tablet TAKE ONE TABLET BY MOUTH DAILY AT BEDTIME  Patient taking differently: Take 7.5 mg by mouth at bedtime.  05/24/19   Cottle, Billey Co., MD  ondansetron (ZOFRAN) 8 MG tablet Take 1 tablet (8 mg total) by mouth every 8 (eight) hours as needed for nausea or vomiting. 09/21/19   Colon Branch, MD  pantoprazole (PROTONIX) 40 MG tablet Take 1 tablet (40 mg total) by mouth daily. 10/04/19   Colon Branch, MD  thiamine 100 MG tablet Take 1 tablet (100 mg total) by mouth daily. 10/21/19   Colon Branch, MD    Allergies    Penicillins  Review of Systems   Review of  Systems  All other systems reviewed and are negative.   Physical Exam Updated Vital Signs BP 94/68   Pulse (!) 108   Temp 99 F (37.2 C) (Oral)   Resp 15   Ht 5\' 3"  (1.6 m)   Wt 68 kg   LMP 01/22/1992 (Within Months)   SpO2 94%   BMI 26.57 kg/m   Physical Exam Vitals and nursing note reviewed.  Constitutional:      General: She is not in acute distress.    Appearance: She is well-developed.  HENT:     Head: Normocephalic and atraumatic.     Comments: 2 cm posterior scalp laceration Eyes:     Conjunctiva/sclera: Conjunctivae normal.  Cardiovascular:     Rate and Rhythm: Normal rate and regular rhythm.     Heart sounds: No murmur heard.   Pulmonary:     Effort: Pulmonary effort is normal. No respiratory distress.     Breath sounds: Normal breath sounds.  Abdominal:     Palpations: Abdomen is soft.     Tenderness: There is no abdominal tenderness.  Musculoskeletal:     Cervical back: Neck supple.  Skin:    General: Skin is warm and dry.  Neurological:     Mental Status: She is alert.  Psychiatric:        Mood and Affect: Mood normal.        Behavior: Behavior normal.     Comments: Seems intoxicated     ED Results / Procedures / Treatments   Labs (all labs ordered are listed, but only abnormal results are displayed) Labs Reviewed - No data to display  EKG None  Radiology No results found.  Procedures .Marland KitchenLaceration Repair  Date/Time: 10/30/2019 5:25 AM Performed by: Montine Circle, PA-C Authorized by: Montine Circle, PA-C   Consent:    Consent obtained:  Verbal   Consent given by:  Patient   Risks discussed:  Infection, need for additional repair, pain, poor cosmetic result and poor wound healing   Alternatives discussed:  No treatment and delayed treatment Universal protocol:    Procedure explained and questions answered to patient or proxy's satisfaction: yes     Relevant documents present and verified: yes     Test results available and  properly labeled:  yes     Imaging studies available: yes     Required blood products, implants, devices, and special equipment available: yes     Site/side marked: yes     Immediately prior to procedure, a time out was called: yes     Patient identity confirmed:  Verbally with patient Laceration details:    Location:  Scalp   Scalp location:  Crown   Length (cm):  2 Repair type:    Repair type:  Simple Exploration:    Wound exploration: wound explored through full range of motion     Contaminated: no   Treatment:    Area cleansed with:  Saline   Irrigation method:  Syringe Skin repair:    Repair method:  Staples   Number of staples:  3 Approximation:    Approximation:  Close Post-procedure details:    Dressing:  Open (no dressing)   (including critical care time)  Medications Ordered in ED Medications - No data to display  ED Course  I have reviewed the triage vital signs and the nursing notes.  Pertinent labs & imaging results that were available during my care of the patient were reviewed by me and considered in my medical decision making (see chart for details).    MDM Rules/Calculators/A&P                          Patient here with fall from home.  She fell striking the back of her head.  She is not anticoagulated.  She does have significant history for alcohol dependence and was drinking last night.  She is also on benzodiazepines.  She seems slightly intoxicated.  Family member has arrived to pick her up.  She ambulates maintaining greater than 90% O2 saturation.  CT scans of the head and cervical spine revealed no intracranial injury.  There was a small laceration that was repaired with 3 staples.  Tetanus shot is current.  Patient seen by and discussed with Dr. Randal Buba.  Final Clinical Impression(s) / ED Diagnoses Final diagnoses:  Fall, initial encounter  Injury of head, initial encounter  Laceration of scalp, initial encounter    Rx / DC Orders ED  Discharge Orders    None       Montine Circle, PA-C 10/30/19 [place], April, MD 10/30/19 [phone]

## 2019-11-03 ENCOUNTER — Other Ambulatory Visit: Payer: Self-pay

## 2019-11-03 ENCOUNTER — Telehealth: Payer: Self-pay

## 2019-11-03 ENCOUNTER — Ambulatory Visit (INDEPENDENT_AMBULATORY_CARE_PROVIDER_SITE_OTHER): Payer: Medicare Other | Admitting: Internal Medicine

## 2019-11-03 ENCOUNTER — Encounter: Payer: Self-pay | Admitting: Internal Medicine

## 2019-11-03 VITALS — BP 110/77 | HR 88 | Temp 97.6°F | Resp 18 | Ht 62.0 in | Wt 141.5 lb

## 2019-11-03 DIAGNOSIS — Z23 Encounter for immunization: Secondary | ICD-10-CM | POA: Diagnosis not present

## 2019-11-03 DIAGNOSIS — R4701 Aphasia: Secondary | ICD-10-CM

## 2019-11-03 DIAGNOSIS — G629 Polyneuropathy, unspecified: Secondary | ICD-10-CM

## 2019-11-03 DIAGNOSIS — I10 Essential (primary) hypertension: Secondary | ICD-10-CM

## 2019-11-03 DIAGNOSIS — J9611 Chronic respiratory failure with hypoxia: Secondary | ICD-10-CM

## 2019-11-03 DIAGNOSIS — R269 Unspecified abnormalities of gait and mobility: Secondary | ICD-10-CM

## 2019-11-03 DIAGNOSIS — R0902 Hypoxemia: Secondary | ICD-10-CM

## 2019-11-03 DIAGNOSIS — R130 Aphagia: Secondary | ICD-10-CM

## 2019-11-03 NOTE — Progress Notes (Signed)
SATURATION QUALIFICATIONS: (This note is used to comply with regulatory documentation for home oxygen)  Patient Saturations on Room Air at Rest = 87%  Patient Saturations on Room Air while Ambulating = %  Patient Saturations on 2 Liters of oxygen while Ambulating = %  Please briefly explain why patient needs home oxygen: chronic resp failure

## 2019-11-03 NOTE — Patient Instructions (Addendum)
We will start you on oxygen  I am referring you to the pulmonary office due to your oxygen being low Please call the following number to schedule: [phone]  Please take folic acid 1mg  and  thiamine 100 mg daily  We are arranging for a brain MRI  If your difficulty with the speech increases or you have other strokelike symptoms: Go to the ER    GO TO THE FRONT DESK, PLEASE SCHEDULE YOUR APPOINTMENTS Come back for   stitches removal in 4 days  Come back for office checkup in 4 weeks

## 2019-11-03 NOTE — Telephone Encounter (Signed)
O2 orders faxed to [facility] at [phone].

## 2019-11-03 NOTE — Progress Notes (Signed)
Pre visit review using our clinic review tool, if applicable. No additional management support is needed unless otherwise documented below in the visit note. 

## 2019-11-03 NOTE — Progress Notes (Signed)
Subjective:    Patient ID: Monique Ballard, female    DOB: 1940/02/23, 79 y.o.   MRN: [id number]  DOS:  11/03/2019 Type of visit - description: ER follow-up   since the last office visit, she went to the ER 10/30/2019, had a fall at home while walking in a closet full of boxes and things, injuring the back of her head, no LOC, laceration was repaired. Had CT head & cervical spine with no acute changes.  Since then, she feels quite unsteady, is using her cane consistently, no further falls. She did report that she feels overall less steady than before. Was referred to home PT but she declined and she still thinks does not need PT.  Also, since the ER visit has noted that from time to time she is unable to finish sentences. Denies headache, dizziness, diplopia or motor deficits.  No facial weakness.  EtOH: Reports significant decrease on intake, still 2 drinks daily.  Review of Systems Patient's husband denies mental status changes or confusion  Past Medical History:  Diagnosis Date  . Breast cancer ([place]) fall 1997    Left Lumpectomy, XRT.  treated with Tamoxifem and Evista  . Depression   . Endometriosis in her 20's   surgery to help clear endometriosis to obtain a pregnancy.  Marland Kitchen GERD (gastroesophageal reflux disease)    w/"stretching" remotely  . HTN (hypertension)     Past Surgical History:  Procedure Laterality Date  . BREAST LUMPECTOMY Left fall 1997   Lymph nodes X 3 were negative, ER+, Radiation treatmetn.  Took Tamoxifem X 5 yrs then Evista for 3-5 yrs.  . CESAREAN SECTION     x2  first pregnancy breach and second normal  . PELVIC FRACTURE SURGERY     endometriosis, remotely    Allergies as of 11/03/2019      Reactions   Penicillins Rash   Has patient had a PCN reaction causing immediate rash, facial/tongue/throat swelling, SOB or lightheadedness with hypotension: Yes Has patient had a PCN reaction causing severe rash involving mucus membranes or skin necrosis:  No Has patient had a PCN reaction that required hospitalization No Has patient had a PCN reaction occurring within the last 10 years: No If all of the above answers are "NO", then may proceed with Cephalosporin use.      Medication List       Accurate as of November 03, 2019 11:59 PM. If you have any questions, ask your nurse or doctor.        STOP taking these medications   ALPRAZolam 0.25 MG tablet Commonly known as: Duanne Moron Stopped by: Kathlene November, MD     TAKE these medications   escitalopram 20 MG tablet Commonly known as: LEXAPRO Take 1 tablet (20 mg total) by mouth daily.   folic acid 1 MG tablet Commonly known as: FOLVITE Take 1 tablet (1 mg total) by mouth daily.   metoprolol tartrate 50 MG tablet Commonly known as: LOPRESSOR Take 0.5 tablets (25 mg total) by mouth 2 (two) times daily.   OLANZapine 7.5 MG tablet Commonly known as: ZYPREXA TAKE ONE TABLET BY MOUTH DAILY AT BEDTIME   ondansetron 8 MG tablet Commonly known as: ZOFRAN Take 1 tablet (8 mg total) by mouth every 8 (eight) hours as needed for nausea or vomiting.   pantoprazole 40 MG tablet Commonly known as: PROTONIX Take 1 tablet (40 mg total) by mouth daily.   thiamine 100 MG tablet Take 1 tablet (100 mg total) by  mouth daily.            Durable Medical Equipment  (From admission, onward)         Start     Ordered   11/03/19 0000  For home use only DME oxygen       Question Answer Comment  Length of Need Lifetime   Mode or (Route) Nasal cannula   Liters per Minute 2   Frequency Continuous (stationary and portable oxygen unit needed)   Oxygen conserving device No   Oxygen delivery system Gas      11/03/19 1541             Objective:   Physical Exam BP 110/77 (BP Location: Left Arm, Patient Position: Sitting, Cuff Size: Small)   Pulse 88   Temp 97.6 F (36.4 C) (Oral)   Resp 18   Ht 5\' 2"  (1.575 m)   Wt 141 lb 8 oz (64.2 kg)   LMP 01/22/1992 (Within Months)   SpO2 (!) 87%    BMI 25.88 kg/m  General:   Well developed, frail appearing, nontoxic. HEENT:  Normocephalic . Face symmetric, atraumatic Lungs:  CTA B Normal respiratory effort, no intercostal retractions, no accessory muscle use. Heart: RRR,  no murmur.  Lower extremities: no pretibial edema bilaterally  Skin: Not pale. Not jaundice Neurologic:  alert & oriented X3.  Gait: Assisted by a cane, slow, not very steady. Face symmetric. Speech: Have some difficulty finding words but not slurred per se. Motor: Symmetric. Psych--  Cognition and judgment appear intact.  Cooperative with normal attention span and concentration.  Behavior appropriate. No anxious or depressed appearing.      Assessment     Assessment HTN Depression, Anxiety - Dr Clovis Pu  Tremor dx [phone] Gait d/o: unsteady onset ~ 2017 GERD with esophageal stricture L Breast cancer, 1997, lumpectomy, XRT, released from oncology ETOH  PLAN: Fall: As described above, possibly mechanical, CT head and neck nonacute. She is now using her cane consistently but declines home PT. Plan: Call if she changes her mind about PT, no more Xanax prescriptions, continue using a cane or even a walker. Gait disorder: See above. Neuropathy: See last visit,thiamine was low, she was recommended folic acid and thiamine but has not started.  See AVS Today she reports no paresthesias but has an appointment to see neurology in few weeks. EtOH: Again recommend complete abstinence. Depression anxiety: Recommend to continue with Dr. Clovis Pu.  Stop Xanax Hypoxia: See last visit. Oxygen today  after she arrived to the office: 90%, recheck at rest 87%.  We will start oxygen supplementation.  Echo ordered, not pursue by pt.  Refer to pulmonary. Aphasia: Unable to finish sentences at times, will get a brain MRI. RTC for stitches removal in 4 days RTC for checkup in 4  weeks  This visit occurred during the SARS-CoV-2 public health emergency.  Safety protocols  were in place, including screening questions prior to the visit, additional usage of staff PPE, and extensive cleaning of exam room while observing appropriate contact time as indicated for disinfecting solutions.

## 2019-11-04 NOTE — Assessment & Plan Note (Signed)
Fall: As described above, possibly mechanical, CT head and neck nonacute. She is now using her cane consistently but declines home PT. Plan: Call if she changes her mind about PT, no more Xanax prescriptions, continue using a cane or even a walker. Gait disorder: See above. Neuropathy: See last visit,thiamine was low, she was recommended folic acid and thiamine but has not started.  See AVS Today she reports no paresthesias but has an appointment to see neurology in few weeks. EtOH: Again recommend complete abstinence. Depression anxiety: Recommend to continue with Dr. Clovis Pu.  Stop Xanax Hypoxia: See last visit. Oxygen today  after she arrived to the office: 90%, recheck at rest 87%.  We will start oxygen supplementation.  Echo ordered, not pursue by pt.  Refer to pulmonary. Aphasia: Unable to finish sentences at times, will get a brain MRI. RTC for stitches removal in 4 days RTC for checkup in 4  weeks

## 2019-11-10 ENCOUNTER — Encounter: Payer: Self-pay | Admitting: Internal Medicine

## 2019-11-10 ENCOUNTER — Other Ambulatory Visit: Payer: Self-pay

## 2019-11-10 ENCOUNTER — Ambulatory Visit (INDEPENDENT_AMBULATORY_CARE_PROVIDER_SITE_OTHER): Payer: Medicare Other | Admitting: Internal Medicine

## 2019-11-10 VITALS — BP 95/67 | HR 69 | Temp 97.8°F | Resp 18 | Ht 62.0 in | Wt 141.4 lb

## 2019-11-10 DIAGNOSIS — R4701 Aphasia: Secondary | ICD-10-CM

## 2019-11-10 DIAGNOSIS — R0902 Hypoxemia: Secondary | ICD-10-CM

## 2019-11-10 DIAGNOSIS — S0101XD Laceration without foreign body of scalp, subsequent encounter: Secondary | ICD-10-CM

## 2019-11-10 NOTE — Patient Instructions (Addendum)
Your blood pressure is low today. Check the  blood pressure 2 or 3 times week   BP GOAL is between 110/65 and  135/85. If it is consistently higher or lower, let me know  To schedule your MRI: [phone]    Please call the following number to schedule: [phone]  [phone] to schedule a visit with a pulmonologist

## 2019-11-10 NOTE — Progress Notes (Signed)
Subjective:    Patient ID: Monique Ballard, female    DOB: 20-Jul-1940, 79 y.o.   MRN: [id number]  DOS:  11/10/2019 Type of visit - description: Acute, here with her husband Needs staples removed from the  scalp. Denies pain, swelling or discharge. They also reports that her alcohol consumption has significantly decreased. Recently seen with aphasia, that seems to be resolved.   Review of Systems See above   Past Medical History:  Diagnosis Date   Breast cancer ([place]) fall 1997    Left Lumpectomy, XRT.  treated with Tamoxifem and Evista   Depression    Endometriosis in her 20's   surgery to help clear endometriosis to obtain a pregnancy.   GERD (gastroesophageal reflux disease)    w/"stretching" remotely   HTN (hypertension)     Past Surgical History:  Procedure Laterality Date   BREAST LUMPECTOMY Left fall 1997   Lymph nodes X 3 were negative, ER+, Radiation treatmetn.  Took Tamoxifem X 5 yrs then Evista for 3-5 yrs.   CESAREAN SECTION     x2  first pregnancy breach and second normal   PELVIC FRACTURE SURGERY     endometriosis, remotely    Allergies as of 11/10/2019      Reactions   Penicillins Rash   Has patient had a PCN reaction causing immediate rash, facial/tongue/throat swelling, SOB or lightheadedness with hypotension: Yes Has patient had a PCN reaction causing severe rash involving mucus membranes or skin necrosis: No Has patient had a PCN reaction that required hospitalization No Has patient had a PCN reaction occurring within the last 10 years: No If all of the above answers are "NO", then may proceed with Cephalosporin use.      Medication List       Accurate as of November 10, 2019  2:50 PM. If you have any questions, ask your nurse or doctor.        escitalopram 20 MG tablet Commonly known as: LEXAPRO Take 1 tablet (20 mg total) by mouth daily.   folic acid 1 MG tablet Commonly known as: FOLVITE Take 1 tablet (1 mg total) by mouth  daily.   metoprolol tartrate 50 MG tablet Commonly known as: LOPRESSOR Take 0.5 tablets (25 mg total) by mouth 2 (two) times daily.   OLANZapine 7.5 MG tablet Commonly known as: ZYPREXA TAKE ONE TABLET BY MOUTH DAILY AT BEDTIME   ondansetron 8 MG tablet Commonly known as: ZOFRAN Take 1 tablet (8 mg total) by mouth every 8 (eight) hours as needed for nausea or vomiting.   pantoprazole 40 MG tablet Commonly known as: PROTONIX Take 1 tablet (40 mg total) by mouth daily.   thiamine 100 MG tablet Take 1 tablet (100 mg total) by mouth daily.          Objective:   Physical Exam BP 95/67    Pulse 69    Temp 97.8 F (36.6 C) (Oral)    Resp 18    Ht 5\' 2"  (1.575 m)    Wt 141 lb 6.4 oz (64.1 kg)    LMP 01/22/1992 (Within Months)    SpO2 95%    BMI 25.86 kg/m  General:   Well developed, although still frail, she seems slightly stronger today HEENT:  Normocephalic . Face symmetric, atraumatic  skin: Not pale. Not jaundice.  Scalp injury: No redness, no swelling, no discharge Neurologic:  alert & oriented X3.  Speech normal, gait assisted by a cane Psych--  Cognition and judgment appear  intact.  Cooperative with normal attention span and concentration.  Behavior appropriate. No anxious or depressed appearing.      Assessment     Assessment HTN Depression, Anxiety - Dr Clovis Pu  Tremor dx [phone] Gait d/o: unsteady onset ~ 2017 GERD with esophageal stricture L Breast cancer, 1997, lumpectomy, XRT, released from oncology ETOH  PLAN: Scalp staples: 3 staples  removed without problems.  Recommend to keep the area clean and dry. Gait disorder: Seems a stable EtOH: Husband reports   she has decreased consumption to 1 glass of wine at night. Hypoxia: was rec to use oxygen 24/7, she is not wearing oxygen today and O2 sat is 95%.  Nevertheless, recommend to see pulmonary. Aphasia: Seems better, MRI brain pending. Has already scheduled follow-up appointment with me.    This  visit occurred during the SARS-CoV-2 public health emergency.  Safety protocols were in place, including screening questions prior to the visit, additional usage of staff PPE, and extensive cleaning of exam room while observing appropriate contact time as indicated for disinfecting solutions.

## 2019-11-12 NOTE — Assessment & Plan Note (Signed)
Scalp staples: 3 staples  removed without problems.  Recommend to keep the area clean and dry. Gait disorder: Seems a stable EtOH: Husband reports   she has decreased consumption to 1 glass of wine at night. Hypoxia: was rec to use oxygen 24/7, she is not wearing oxygen today and O2 sat is 95%.  Nevertheless, recommend to see pulmonary. Aphasia: Seems better, MRI brain pending. Has already scheduled follow-up appointment with me.

## 2019-11-15 ENCOUNTER — Ambulatory Visit (HOSPITAL_COMMUNITY): Admission: RE | Admit: 2019-11-15 | Payer: Medicare Other | Source: Ambulatory Visit

## 2019-11-15 ENCOUNTER — Encounter (HOSPITAL_COMMUNITY): Payer: Self-pay

## 2019-11-15 ENCOUNTER — Ambulatory Visit (HOSPITAL_COMMUNITY)
Admission: RE | Admit: 2019-11-15 | Discharge: 2019-11-15 | Disposition: A | Discharge status: Home / Self Care | Payer: Medicare Other | Source: Ambulatory Visit | Attending: Internal Medicine | Admitting: Internal Medicine

## 2019-11-15 ENCOUNTER — Other Ambulatory Visit: Payer: Self-pay

## 2019-11-15 DIAGNOSIS — I6389 Other cerebral infarction: Secondary | ICD-10-CM | POA: Diagnosis not present

## 2019-11-15 DIAGNOSIS — R130 Aphagia: Secondary | ICD-10-CM | POA: Diagnosis not present

## 2019-11-15 DIAGNOSIS — I6782 Cerebral ischemia: Secondary | ICD-10-CM | POA: Diagnosis not present

## 2019-11-15 DIAGNOSIS — G9389 Other specified disorders of brain: Secondary | ICD-10-CM | POA: Diagnosis not present

## 2019-11-15 DIAGNOSIS — R9082 White matter disease, unspecified: Secondary | ICD-10-CM | POA: Diagnosis not present

## 2019-11-15 IMAGING — MR MR HEAD W/O CM
6 of 10 series · 28 of 48 positions shown · non-contrast
Comparison: None.

CLINICAL DATA: TIA

EXAM:
MRI HEAD WITHOUT CONTRAST
TECHNIQUE: Multiplanar, multiecho pulse sequences of the brain and surrounding
structures were obtained without intravenous contrast.

[Series 2: DWI · axial · 3.0mm · 0.94mm/px · z∈[-107,+31]mm · 9 of 96 slices shown (1 of 2)]
[im 1/96]
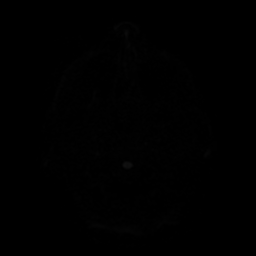
[im 12/96]
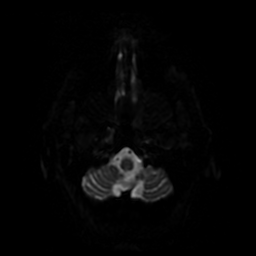
[im 24/96]
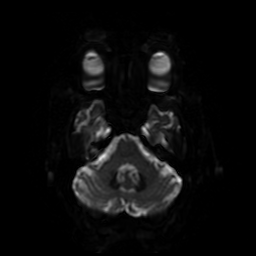
[im 36/96]
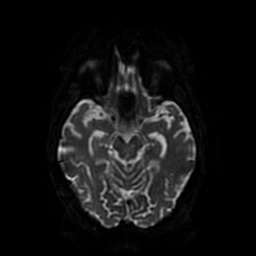
[im 48/96]
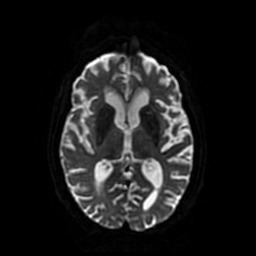
[im 60/96]
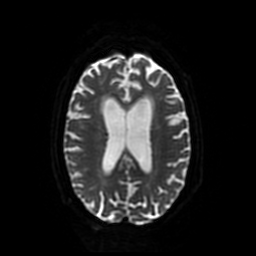
[im 72/96]
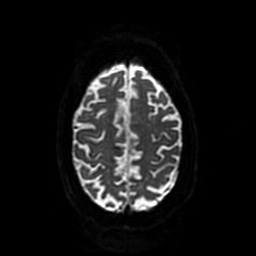
[im 84/96]
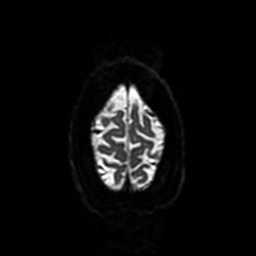
[im 96/96]
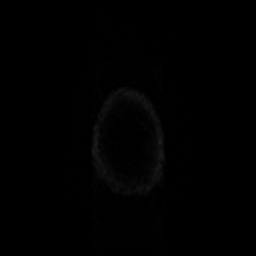

[Series 3: DWI · coronal · 4.0mm · 0.94mm/px · 7 of 72 slices shown (2 of 2)]
[im 1/72]
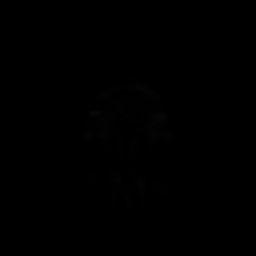
[im 12/72]
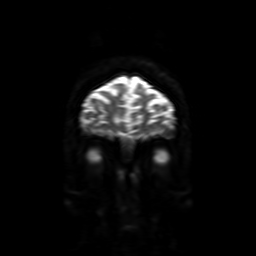
[im 24/72]
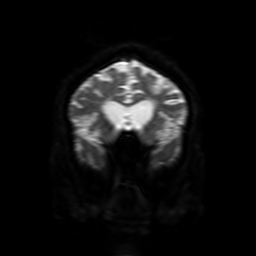
[im 36/72]
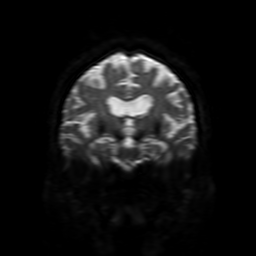
[im 48/72]
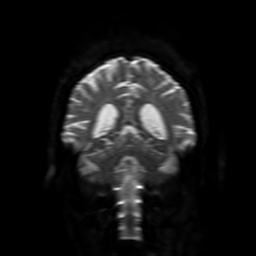
[im 60/72]
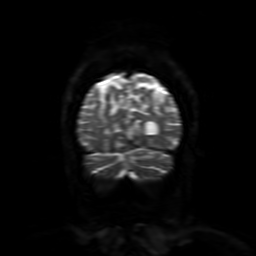
[im 72/72]
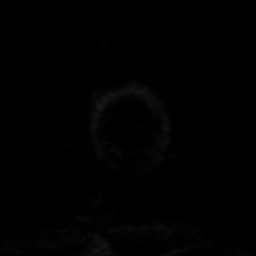

[Series 4: FLAIR · sagittal · 5.0mm · 0.23mm/px · 2 of 23 slices shown (1 of 2)]
[im 1/23]
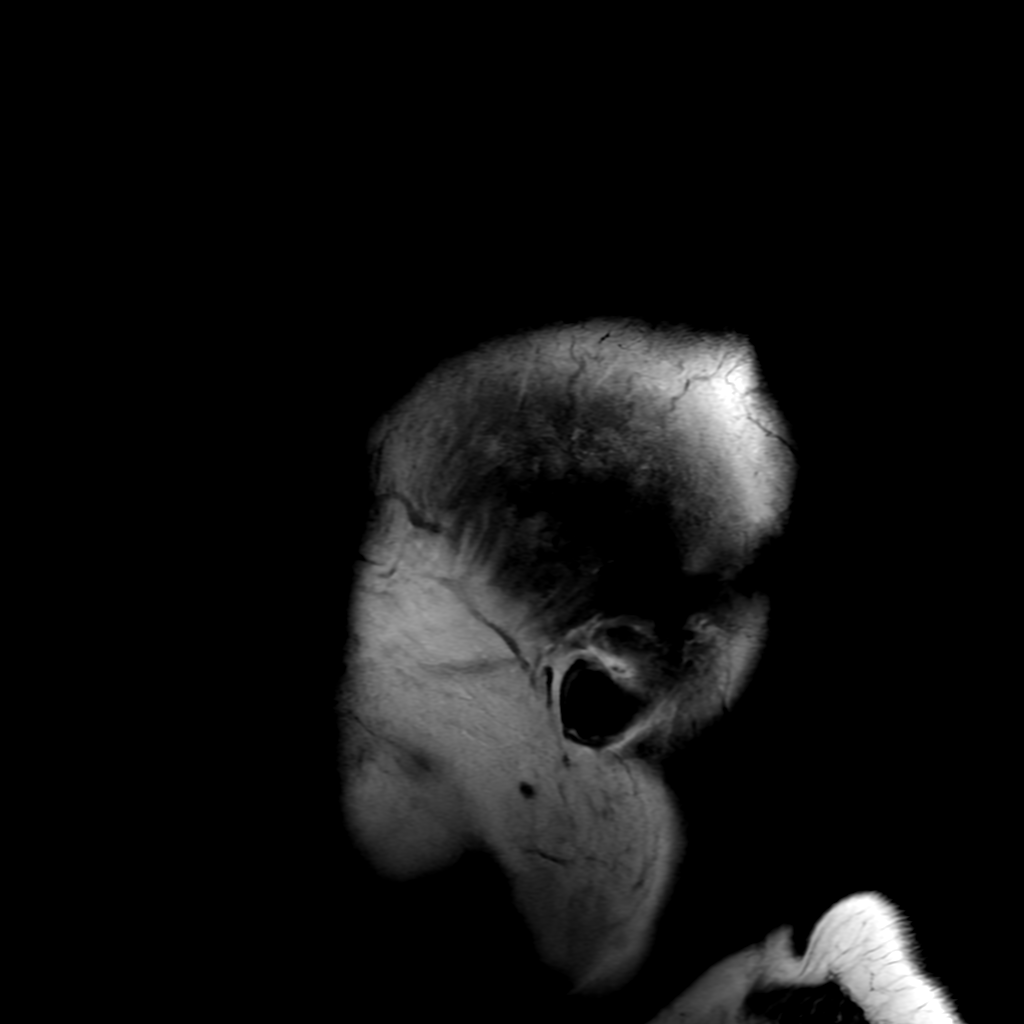
[im 23/23]
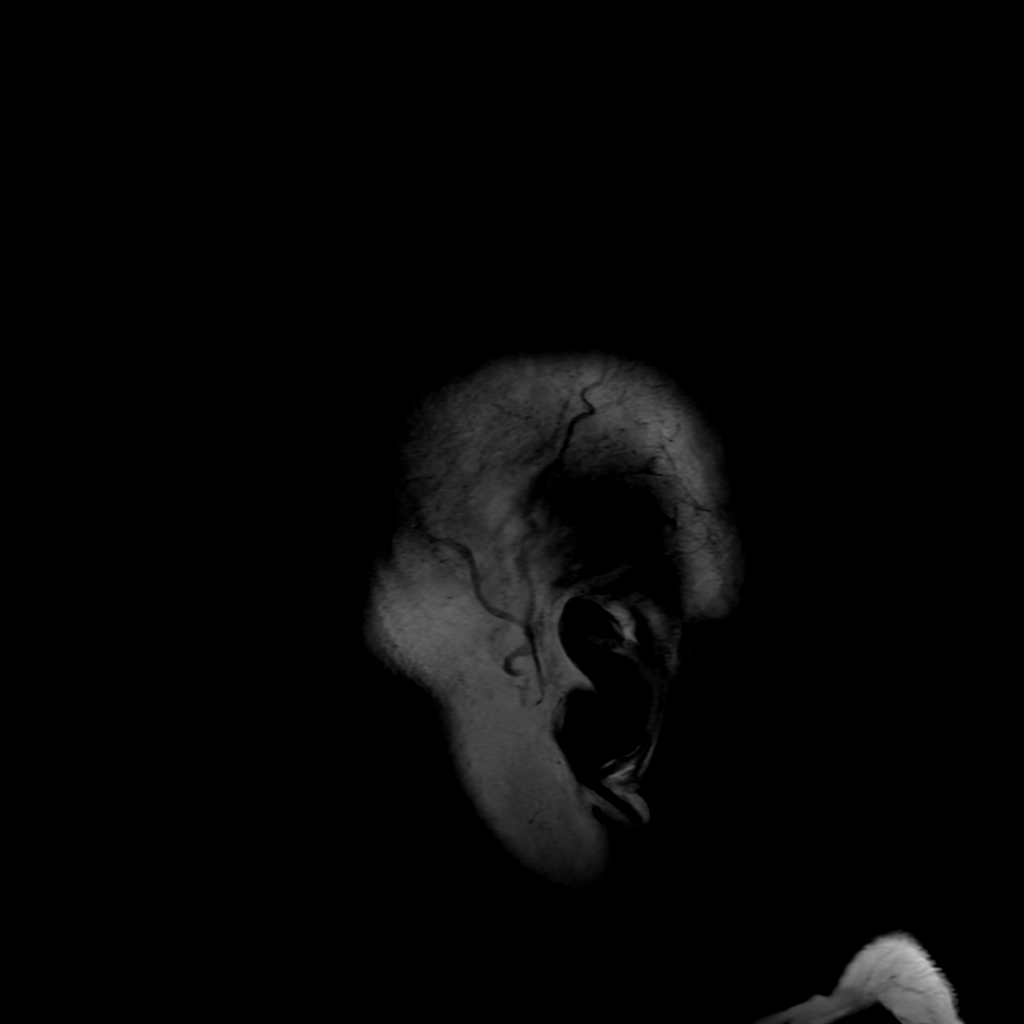

[Series 6: FLAIR · axial · 3.0mm · 0.45mm/px · z∈[-103,+26]mm · 2 of 23 slices shown (2 of 2)]
[im 1/23]
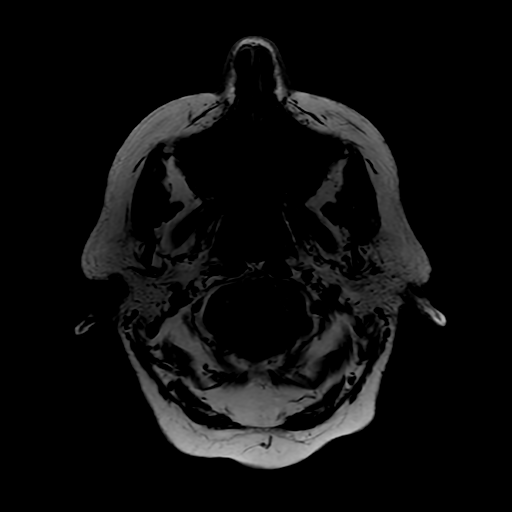
[im 23/23]
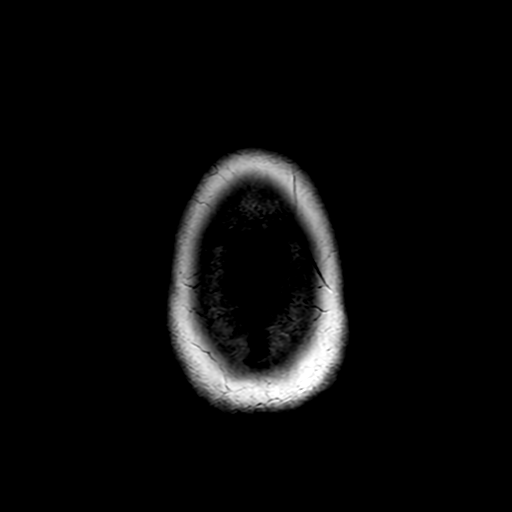

[Series 250: ADC · axial · 3.0mm · 0.94mm/px · z∈[-107,+31]mm · 5 of 48 slices shown (1 of 2)]
[im 1/48]
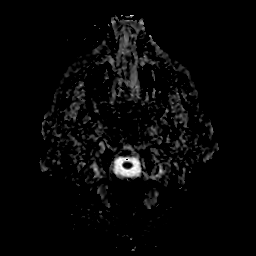
[im 12/48]
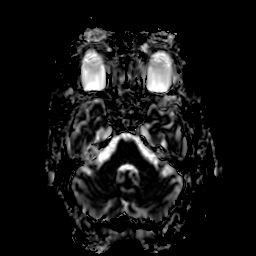
[im 24/48]
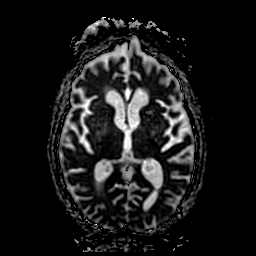
[im 36/48]
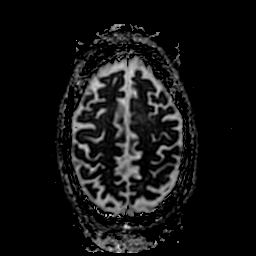
[im 48/48]
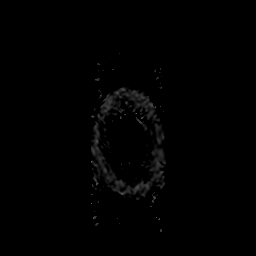

[Series 350: ADC · coronal · 4.0mm · 0.94mm/px · 3 of 36 slices shown (2 of 2)]
[im 1/36]
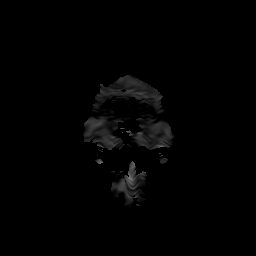
[im 18/36]
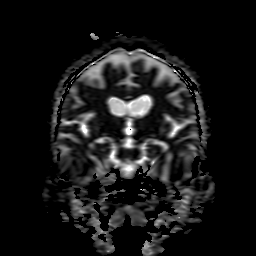
[im 36/36]
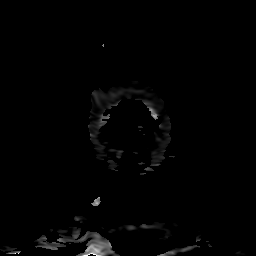

[28 of 48 positions shown; findings below may reference images not displayed]

FINDINGS: Brain: There is no acute infarction or intracranial hemorrhage.
There is no intracranial mass, mass effect, or edema. There is no
hydrocephalus or extra-axial fluid collection. Patchy T2
hyperintensity in the supratentorial white matter is nonspecific but
may reflect mild microvascular ischemic changes. There is a chronic
small vessel infarct of the right caudate. Is prominence of the
ventricles and sulci reflects generalized parenchymal volume loss.

Vascular: Major vessel flow voids at the skull base are preserved.

Skull and upper cervical spine: Normal marrow signal is preserved.

Sinuses/Orbits: Paranasal sinuses are aerated. No significant
abnormality of the orbits.

Other: Sella is unremarkable.  Minimal mastoid fluid opacification.
IMPRESSION: No evidence of recent infarction, hemorrhage, or mass. Chronic
microvascular ischemic changes.

## 2019-11-19 DIAGNOSIS — Z23 Encounter for immunization: Secondary | ICD-10-CM | POA: Diagnosis not present

## 2019-12-01 ENCOUNTER — Other Ambulatory Visit: Payer: Self-pay

## 2019-12-01 ENCOUNTER — Ambulatory Visit (INDEPENDENT_AMBULATORY_CARE_PROVIDER_SITE_OTHER): Payer: Medicare Other | Admitting: Internal Medicine

## 2019-12-01 ENCOUNTER — Encounter: Payer: Self-pay | Admitting: Internal Medicine

## 2019-12-01 VITALS — BP 89/63 | HR 77 | Temp 98.3°F | Resp 18 | Ht 62.0 in | Wt 138.1 lb

## 2019-12-01 DIAGNOSIS — R0902 Hypoxemia: Secondary | ICD-10-CM | POA: Diagnosis not present

## 2019-12-01 DIAGNOSIS — G629 Polyneuropathy, unspecified: Secondary | ICD-10-CM

## 2019-12-01 DIAGNOSIS — I10 Essential (primary) hypertension: Secondary | ICD-10-CM

## 2019-12-01 DIAGNOSIS — R269 Unspecified abnormalities of gait and mobility: Secondary | ICD-10-CM | POA: Diagnosis not present

## 2019-12-01 MED ORDER — DRONABINOL 2.5 MG PO CAPS: 2.5000 mg | ORAL_CAPSULE | Freq: Two times a day (BID) | ORAL | 1 refills | Status: DC

## 2019-12-01 NOTE — Progress Notes (Signed)
Pre visit review using our clinic review tool, if applicable. No additional management support is needed unless otherwise documented below in the visit note. 

## 2019-12-01 NOTE — Patient Instructions (Addendum)
Your blood pressure is too low. Please a stop metoprolol 25 mg  Continue checking your blood pressure and heart rate.  If your blood pressure goes over 130/85 or  your heart rate goes over 110: please call the office.      GO TO THE LAB : Get the blood work     [place], [place] back for for a checkup in 3 months

## 2019-12-01 NOTE — Progress Notes (Signed)
Subjective:    Patient ID: Monique Ballard, female    DOB: [birth year], 79 y.o.   MRN: [id number]  DOS:  12/01/2019 Type of visit - description: Follow-up, here with her husband. In general feels well. BP has been noted to be low. She denies dizziness. No recent fall. Appetite is low, she is trying to "force herself" to eat. Weight loss noted (she also is drinking etoh much less  which may account for the weight loss)  Wt Readings from Last 3 Encounters:  12/01/19 138 lb 2 oz (62.7 kg)  11/10/19 141 lb 6.4 oz (64.1 kg)  11/03/19 141 lb 8 oz (64.2 kg)     Review of Systems Denies feeling too sleepy, energy is actually a little improved. Denies chest pain or difficulty breathing No postprandial abdominal pain or difficulty swallowing.  No blood in the stools, occasional diarrhea. Emotionally doing okay.  Past Medical History:  Diagnosis Date  . Breast cancer ([place]) fall 1997    Left Lumpectomy, XRT.  treated with Tamoxifem and Evista  . Depression   . Endometriosis in her 20's   surgery to help clear endometriosis to obtain a pregnancy.  [place] GERD (gastroesophageal reflux disease)    w/"stretching" remotely  . HTN (hypertension)     Past Surgical History:  Procedure Laterality Date  . BREAST LUMPECTOMY Left fall 1997   Lymph nodes X 3 were negative, ER+, Radiation treatmetn.  Took Tamoxifem X 5 yrs then Evista for 3-5 yrs.  . CESAREAN SECTION     x2  first pregnancy breach and second normal  . PELVIC FRACTURE SURGERY     endometriosis, remotely    Social History   Socioeconomic History  . Marital status: Married    Spouse name: Not on file  . Number of children: 2  . Years of education: Not on file  . Highest education level: Not on file  Occupational History  . Occupation: retired  Tobacco Use  . Smoking status: Former Research scientist (life sciences)  . Smokeless tobacco: Never Used  . Tobacco comment: Quit >30 yrs ago  Substance and Sexual Activity  . Alcohol use: Yes    Comment:  2 glass wine dialy  . Drug use: No  . Sexual activity: Never    Birth control/protection: None  Other Topics Concern  . 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 Difficulty of Paying Living Expenses: Not hard at all  Food Insecurity: No Food Insecurity  . Worried About Charity fundraiser in the Last Year: Never true  . Ran Out of Food in the Last Year: Never true  Transportation Needs: No Transportation Needs  . Lack of Transportation (Medical): No  . Lack of Transportation (Non-Medical): No  Physical Activity:   . Days of Exercise per Week: Not on file  . Minutes of Exercise per Session: Not on file  Stress:   . Feeling of Stress : Not on file  Social Connections:   . Frequency of Communication with Friends and Family: Not on file  . Frequency of Social Gatherings with Friends and Family: Not on file  . Attends Religious  Services: Not on file  . Active Member of Clubs or Organizations: Not on file  . Attends Archivist Meetings: Not on file  . Marital Status: Not on file  Intimate Partner Violence:   . Fear of Current or Ex-Partner: Not on file  . Emotionally Abused: Not on file  . Physically Abused: Not on file  . Sexually Abused: Not on file      Allergies as of 12/01/2019      Reactions   Penicillins Rash   Has patient had a PCN reaction causing immediate rash, facial/tongue/throat swelling, SOB or lightheadedness with hypotension: Yes Has patient had a PCN reaction causing severe rash involving mucus membranes or skin necrosis: No Has patient had a PCN reaction that required hospitalization No Has patient had a PCN reaction occurring within the last 10 years:  No If all of the above answers are "NO", then may proceed with Cephalosporin use.      Medication List       Accurate as of December 01, 2019 11:59 PM. If you have any questions, ask your nurse or doctor.        STOP taking these medications   metoprolol tartrate 50 MG tablet Commonly known as: LOPRESSOR Stopped by: Kathlene November, MD     TAKE these medications   escitalopram 20 MG tablet Commonly known as: LEXAPRO Take 1 tablet (20 mg total) by mouth daily.   folic acid 1 MG tablet Commonly known as: FOLVITE Take 1 tablet (1 mg total) by mouth daily.   OLANZapine 7.5 MG tablet Commonly known as: ZYPREXA TAKE ONE TABLET BY MOUTH DAILY AT BEDTIME   ondansetron 8 MG tablet Commonly known as: ZOFRAN Take 1 tablet (8 mg total) by mouth every 8 (eight) hours as needed for nausea or vomiting.   pantoprazole 40 MG tablet Commonly known as: PROTONIX Take 1 tablet (40 mg total) by mouth daily.   thiamine 100 MG tablet Take 1 tablet (100 mg total) by mouth daily.          Objective:   Physical Exam BP (!) 89/63 (BP Location: Left Arm, Patient Position: Sitting, Cuff Size: Small)   Pulse 77   Temp 98.3 F (36.8 C) (Oral)   Resp 18   Ht 5\' 2"  (1.575 m)   Wt 138 lb 2 oz (62.7 kg)   LMP 01/22/1992 (Within Months)   SpO2 92%   BMI 25.26 kg/m  General:   Well developed, NAD, BMI noted.  HEENT:  Normocephalic . Face symmetric, atraumatic.  Slightly pale Lungs:  CTA B Normal respiratory effort, no intercostal retractions, no accessory muscle use. Heart: RRR,  no murmur.  Abdomen:  Not distended, soft, non-tender. No rebound or rigidity.   Skin: Not jaundice Lower extremities: no pretibial edema bilaterally  Neurologic:  alert & oriented X3.  Speech normal, gait appropriate for age and assisted by a cane Psych--  Cognition and judgment appear intact.  Cooperative with normal attention span and concentration.  Behavior appropriate. No anxious or depressed  appearing.     Assessment    Assessment HTN Depression, Anxiety - Dr Clovis Pu  Tremor dx [phone] Gait d/o: unsteady onset ~ 2017 GERD with esophageal stricture L Breast cancer, 1997, lumpectomy, XRT, released from oncology ETOH  PLAN: HTN: Currently on metoprolol 25 mg half tablet twice a day.  Ambulatory BPs typically less than 100, no high current than 115/68. She looks pale, will check labs. Plan: Stop metoprolol, monitor BPs, see AVS.  Check BMP and CBC Depression, anxiety: Reports her mood is okay Gait disorder: No further falls, uses a cane. EtOH: Reports she is limiting herself to 1 glass of wine Hypoxia: O2 sat at home range from 92-97.  Has an appointment with pulmonary. Aphasia: Seems better, MRI was nonacute Neuropathy: To see neurology in few weeks, symptoms unchanged, on thiamine and folic acid Lack of appetite: Encouraged to continue "forcing" herself to eat, drink plenty of fluids.  Marinol?  At this point I prefer not to since it is a controlled substance History of diarrhea: See previous visit, symptoms have decreased.  Still has occasional problem RTC 3 months     This visit occurred during the SARS-CoV-2 public health emergency.  Safety protocols were in place, including screening questions prior to the visit, additional usage of staff PPE, and extensive cleaning of exam room while observing appropriate contact time as indicated for disinfecting solutions.

## 2019-12-02 LAB — BASIC METABOLIC PANEL
BUN: 5 mg/dL — ABNORMAL LOW (ref 6–23)
CO2: 28 mEq/L (ref 19–32)
Calcium: 8.3 mg/dL — ABNORMAL LOW (ref 8.4–10.5)
Chloride: 106 mEq/L (ref 96–112)
Creatinine, Ser: 0.76 mg/dL (ref 0.40–1.20)
GFR: 75.86 mL/min (ref >60.00)
Glucose, Bld: 91 mg/dL (ref 70–99)
Potassium: 4.1 mEq/L (ref 3.5–5.1)
Sodium: 141 mEq/L (ref 135–145)

## 2019-12-02 LAB — CBC WITH DIFFERENTIAL/PLATELET
Basophils Absolute: 0.1 K/uL (ref 0.0–0.1)
Basophils Relative: 0.8 % (ref 0.0–3.0)
Eosinophils Absolute: 0.1 K/uL (ref 0.0–0.7)
Eosinophils Relative: 1.5 % (ref 0.0–5.0)
HCT: 38.5 % (ref 36.0–46.0)
Hemoglobin: 12.6 g/dL (ref 12.0–15.0)
Lymphocytes Relative: 21.7 % (ref 12.0–46.0)
Lymphs Abs: 1.8 K/uL (ref 0.7–4.0)
MCHC: 32.6 g/dL (ref 30.0–36.0)
MCV: 103.9 fl — ABNORMAL HIGH (ref 78.0–100.0)
Monocytes Absolute: 1.3 K/uL — ABNORMAL HIGH (ref 0.1–1.0)
Monocytes Relative: 15.1 % — ABNORMAL HIGH (ref 3.0–12.0)
Neutro Abs: 5.1 K/uL (ref 1.4–7.7)
Neutrophils Relative %: 60.9 % (ref 43.0–77.0)
Platelets: 247 K/uL (ref 150.0–400.0)
RBC: 3.71 Mil/uL — ABNORMAL LOW (ref 3.87–5.11)
RDW: 14.2 % (ref 11.5–15.5)
WBC: 8.3 K/uL (ref 4.0–10.5)

## 2019-12-02 MED ORDER — THIAMINE HCL 100 MG PO TABS: 100.0000 mg | ORAL_TABLET | Freq: Every day | ORAL | 6 refills | Status: DC

## 2019-12-02 NOTE — Assessment & Plan Note (Signed)
HTN: Currently on metoprolol 25 mg half tablet twice a day.  Ambulatory BPs typically less than 100, no high current than 115/68. She looks pale, will check labs. Plan: Stop metoprolol, monitor BPs, see AVS.  Check BMP and CBC Depression, anxiety: Reports her mood is okay Gait disorder: No further falls, uses a cane. EtOH: Reports she is limiting herself to 1 glass of wine Hypoxia: O2 sat at home range from 92-97.  Has an appointment with pulmonary. Aphasia: Seems better, MRI was nonacute Neuropathy: To see neurology in few weeks, symptoms unchanged, on thiamine and folic acid Lack of appetite: Encouraged to continue "forcing" herself to eat, drink plenty of fluids.  Marinol?  At this point I prefer not to since it is a controlled substance History of diarrhea: See previous visit, symptoms have decreased.  Still has occasional problem RTC 3 months

## 2019-12-02 NOTE — Addendum Note (Signed)
Addended byDamita Dunnings D on: 12/02/2019 04:19 PM   Modules accepted: Orders

## 2019-12-08 ENCOUNTER — Other Ambulatory Visit: Payer: Self-pay

## 2019-12-08 ENCOUNTER — Ambulatory Visit (INDEPENDENT_AMBULATORY_CARE_PROVIDER_SITE_OTHER): Payer: Medicare Other | Admitting: Pulmonary Disease

## 2019-12-08 ENCOUNTER — Encounter: Payer: Self-pay | Admitting: Pulmonary Disease

## 2019-12-08 VITALS — BP 90/58 | HR 113 | Temp 98.0°F | Ht 62.0 in | Wt 137.6 lb

## 2019-12-08 DIAGNOSIS — J9611 Chronic respiratory failure with hypoxia: Secondary | ICD-10-CM | POA: Diagnosis not present

## 2019-12-08 DIAGNOSIS — R0689 Other abnormalities of breathing: Secondary | ICD-10-CM | POA: Diagnosis not present

## 2019-12-08 NOTE — Patient Instructions (Addendum)
Nice to meet you!  Your CT of the chest was very normal - this would indicate the lungs are not to blame for the low oxygen. We should get a ultrasound of your heart with a bubble study to evaluate if there is abnormal mixing of blood.   We will get breathing (pulmonary function) tests in the coming days.  Return for follow up in 3 months or sooner as needed. Call the office or send my a MyChart with concerns.

## 2019-12-09 NOTE — Progress Notes (Signed)
@Patient  ID: Monique Ballard, female    DOB: May 12, 1940, 79 y.o.   MRN: [id number]  Chief Complaint  Patient presents with   Consult    No complaints currently    Referring provider: Colon Branch, MD  HPI:   79 year old woman whom are seen in consultation at request of Monique Fisher, MD for evaluation of hypoxemic respiratory failure.  Notes from referring provider reviewed.  Patient feels well.  She denies any respiratory issue.  No cough.  No dyspnea on exertion.  No shortness of breath.  She was noted to drop her oxygen saturations are less several months.  She is asymptomatic.  She denies any exertional dyspnea, no exertional syncope or presyncope.  No lightheadedness.  Symptoms was incidentally discovered.  She was started on oxygen which she wears rate fairly reliably.  Today in clinic she was below 88% sitting in the chair after walking to the room.  This corrected with oxygen.  Previously she been documented as having exertional hypoxemia.  Her PCP is arranged oxygen therapy.  Work-up for this is included chest imaging with normal chest x-ray, CT angio chest PE with on my interpretation clear lungs, no evidence of infiltrate or volume overload, no PE.  I do believe that there is increased fluid in the abdomen consistent with ascites.  Notably, reviewed CT abdomen pelvis 04/2018 which noted marked hepatic steatosis.  PMH: Hypertension, remote history of breast cancer Surgical history: Breast surgery early 2000 Family history: Coronary disease and first-degree relatives Social history: Never smoker, minimal alcohol use, retired Network engineer at [employer] / Pulmonary Flowsheets:   ACT:  No flowsheet data found.  MMRC: No flowsheet data found.  Epworth:  No flowsheet data found.  Tests:   FENO:  No results found for: NITRICOXIDE  PFT: No flowsheet data found.  WALK:  No flowsheet data found.  Imaging: Personally reviewed and as per EMR  discussion in this note MR Brain Wo Contrast  Result Date: 11/15/2019 CLINICAL DATA:  TIA EXAM: MRI HEAD WITHOUT CONTRAST TECHNIQUE: Multiplanar, multiecho pulse sequences of the brain and surrounding structures were obtained without intravenous contrast. COMPARISON:  None. FINDINGS: Brain: There is no acute infarction or intracranial hemorrhage. There is no intracranial mass, mass effect, or edema. There is no hydrocephalus or extra-axial fluid collection. Patchy T2 hyperintensity in the supratentorial white matter is nonspecific but may reflect mild microvascular ischemic changes. There is a chronic small vessel infarct of the right caudate. Is prominence of the ventricles and sulci reflects generalized parenchymal volume loss. Vascular: Major vessel flow voids at the skull base are preserved. Skull and upper cervical spine: Normal marrow signal is preserved. Sinuses/Orbits: Paranasal sinuses are aerated. No significant abnormality of the orbits. Other: Sella is unremarkable.  Minimal mastoid fluid opacification. IMPRESSION: No evidence of recent infarction, hemorrhage, or mass. Chronic microvascular ischemic changes. Electronically Signed   By: Monique Ballard M.D.   On: 11/15/2019 11:03    Lab Results: Reviewed, mild elevation LFTs in the past CBC    Component Value Date/Time   WBC 8.3 12/01/2019 1456   RBC 3.71 (L) 12/01/2019 1456   HGB 12.6 12/01/2019 1456   HCT 38.5 12/01/2019 1456   PLT 247.0 12/01/2019 1456   MCV 103.9 (H) 12/01/2019 1456   MCH 34.8 (H) 10/12/2019 1323   MCHC 32.6 12/01/2019 1456   RDW 14.2 12/01/2019 1456   LYMPHSABS 1.8 12/01/2019 1456   MONOABS 1.3 (H) 12/01/2019 1456   EOSABS  0.1 12/01/2019 1456   BASOSABS 0.1 12/01/2019 1456    BMET    Component Value Date/Time   NA 141 12/01/2019 1456   K 4.1 12/01/2019 1456   CL 106 12/01/2019 1456   CO2 28 12/01/2019 1456   GLUCOSE 91 12/01/2019 1456   BUN 5 (L) 12/01/2019 1456   CREATININE 0.76 12/01/2019 1456    CREATININE 0.71 10/12/2019 1323   CALCIUM 8.3 (L) 12/01/2019 1456   GFRNONAA >60 10/02/2019 0713   GFRAA >60 10/02/2019 0713    BNP    Component Value Date/Time   BNP 100.1 (H) 09/30/2019 1353    ProBNP No results found for: PROBNP  Specialty Problems      Pulmonary Problems   Acute respiratory failure (HCC)      Allergies  Allergen Reactions   Penicillins Rash    Has patient had a PCN reaction causing immediate rash, facial/tongue/throat swelling, SOB or lightheadedness with hypotension: Yes Has patient had a PCN reaction causing severe rash involving mucus membranes or skin necrosis: No Has patient had a PCN reaction that required hospitalization No Has patient had a PCN reaction occurring within the last 10 years: No If all of the above answers are "NO", then may proceed with Cephalosporin use.     Immunization History  Administered Date(s) Administered   Fluad Quad(high Dose 65+) 10/02/2018, 11/03/2019   Influenza Split 12/08/2013   Influenza Whole 11/16/2009   Influenza, High Dose Seasonal PF 10/14/2014, 12/22/2015, 11/25/2016, 01/01/2018   PFIZER SARS-COV-2 Vaccination 03/22/2019, 04/20/2019   Pneumococcal Conjugate-13 08/10/2014   Pneumococcal Polysaccharide-23 08/28/2009   Td 02/07/1999, 08/24/2008   Tdap 10/01/2014, 06/10/2018   Zoster 08/28/2009    Past Medical History:  Diagnosis Date   Breast cancer ([place]) fall 1997    Left Lumpectomy, XRT.  treated with Tamoxifem and Evista   Depression    Endometriosis in her 20's   surgery to help clear endometriosis to obtain a pregnancy.   GERD (gastroesophageal reflux disease)    w/"stretching" remotely   HTN (hypertension)     Tobacco History: Social History   Tobacco Use  Smoking Status Former Smoker   Packs/day: 0.50   Years: 25.00   Pack years: 12.50   Types: Cigarettes   Quit date: 1990   Years since quitting: 31.9  Smokeless Tobacco Never Used   Counseling given: Not  Answered   Continue to not smoke  Outpatient Encounter Medications as of 12/08/2019  Medication Sig   escitalopram (LEXAPRO) 20 MG tablet Take 1 tablet (20 mg total) by mouth daily.   folic acid (FOLVITE) 1 MG tablet Take 1 tablet (1 mg total) by mouth daily.   OLANZapine (ZYPREXA) 7.5 MG tablet TAKE ONE TABLET BY MOUTH DAILY AT BEDTIME  (Patient taking differently: Take 7.5 mg by mouth at bedtime. )   pantoprazole (PROTONIX) 40 MG tablet Take 1 tablet (40 mg total) by mouth daily.   thiamine 100 MG tablet Take 1 tablet (100 mg total) by mouth daily.   ondansetron (ZOFRAN) 8 MG tablet Take 1 tablet (8 mg total) by mouth every 8 (eight) hours as needed for nausea or vomiting. (Patient not taking: Reported on 12/08/2019)   No facility-administered encounter medications on file as of 12/08/2019.     Review of Systems  Review of Systems  No chest pain with exertion.  No change in breathing or sensation of shortness of breath when lying flat or sitting up.  No orthopnea or PND.  Conference review systems otherwise  negative.  Physical Exam  BP (!) 90/58 (BP Location: Right Arm, Cuff Size: Normal)    Pulse (!) 113    Temp 98 F (36.7 C) (Other (Comment)) Comment (Src): wrist   Ht 5\' 2"  (1.575 m)    Wt 137 lb 9.6 oz (62.4 kg)    LMP 01/22/1992 (Within Months)    SpO2 (!) 88% Comment: Room air   BMI 25.17 kg/m   Wt Readings from Last 5 Encounters:  12/08/19 137 lb 9.6 oz (62.4 kg)  12/01/19 138 lb 2 oz (62.7 kg)  11/10/19 141 lb 6.4 oz (64.1 kg)  11/03/19 141 lb 8 oz (64.2 kg)  10/30/19 150 lb (68 kg)    BMI Readings from Last 5 Encounters:  12/08/19 25.17 kg/m  12/01/19 25.26 kg/m  11/10/19 25.86 kg/m  11/03/19 25.88 kg/m  10/30/19 26.57 kg/m     Physical Exam General: Well-appearing, no distress Eyes: EOMI, no icterus Neck: Supple, no JVP appreciated Respiratory: Clear aspiration bilaterally, no wheezes or crackles Cardiovascular: Regular rhythm, no  murmurs Abdomen: Mild distention, nontender, bowel sounds present Extremities: No synovitis, no revisions Neuro: Normal gait, no weakness Psych: Normal mood, full affect   Assessment & Plan:   Hypoxemic respiratory failure: Has been documented low on exertion and low sats that relative rest in clinic today.  Curiously, she endorses no symptoms of dyspnea, no shortness of breath.  In addition, recent CT a chest 09/30/2019 did not demonstrate any parenchymal findings to suggest fibrosis or emphysema, no evidence of volume overload or pulmonary edema, and no pulmonary embolus.  Given these findings, there is no pulmonary reason for her to have low oxygen.  This raises high suspicion for shunt physiology.  Did not see any obvious AVMs on personal review of CT chest with contrast.  Ordered echocardiogram with bubble study to evaluate intracardiac and intrapulmonary shunt.  Review of CT to my eye raises concern for ascites in the abdomen.  She had severe hepatic steatosis in 2020 CT abdomen pelvis.  She does not endorse platypnea and cannot verify any orthodeoxia but raises concern for possible hepatopulmonary syndrome.  Lastly, will obtain PFTs to see if there is underlying pulmonary dysfunction does not readily evident with normal lung imaging.  Admittedly, her lack of respiratory symptoms and normal imaging will likely result in normal PFTs.     Return in about 3 months (around 03/09/2020).   Lanier Clam, MD 12/09/2019

## 2019-12-10 ENCOUNTER — Ambulatory Visit: Payer: Medicare Other

## 2019-12-10 ENCOUNTER — Other Ambulatory Visit: Payer: Self-pay

## 2019-12-17 ENCOUNTER — Other Ambulatory Visit: Payer: Self-pay | Admitting: Psychiatry

## 2019-12-17 DIAGNOSIS — F411 Generalized anxiety disorder: Secondary | ICD-10-CM

## 2019-12-17 DIAGNOSIS — F331 Major depressive disorder, recurrent, moderate: Secondary | ICD-10-CM

## 2019-12-17 DIAGNOSIS — F4001 Agoraphobia with panic disorder: Secondary | ICD-10-CM

## 2019-12-17 DIAGNOSIS — F5105 Insomnia due to other mental disorder: Secondary | ICD-10-CM

## 2019-12-20 NOTE — Telephone Encounter (Signed)
Call to RS

## 2019-12-23 ENCOUNTER — Encounter (HOSPITAL_COMMUNITY): Payer: Self-pay | Admitting: Pulmonary Disease

## 2019-12-23 ENCOUNTER — Telehealth: Payer: Self-pay | Admitting: Internal Medicine

## 2019-12-23 NOTE — Progress Notes (Signed)
°  Chronic Care Management   Outreach Note  12/23/2019 Name: Monique Ballard MRN: [id number] DOB: [birth year]  Referred by: Colon Branch, MD Reason for referral : No chief complaint on file.   An unsuccessful telephone outreach was attempted today. The patient was referred to the pharmacist for assistance with care management and care coordination.   Follow Up Plan:   Carley Perdue [employer] Scheduler

## 2019-12-27 ENCOUNTER — Telehealth: Payer: Self-pay | Admitting: Neurology

## 2019-12-27 ENCOUNTER — Ambulatory Visit (INDEPENDENT_AMBULATORY_CARE_PROVIDER_SITE_OTHER): Payer: Medicare Other | Admitting: Neurology

## 2019-12-27 ENCOUNTER — Encounter: Payer: Self-pay | Admitting: Neurology

## 2019-12-27 ENCOUNTER — Other Ambulatory Visit: Payer: Self-pay

## 2019-12-27 VITALS — BP 114/77 | HR 85 | Ht 62.0 in | Wt 134.0 lb

## 2019-12-27 DIAGNOSIS — R29898 Other symptoms and signs involving the musculoskeletal system: Secondary | ICD-10-CM | POA: Diagnosis not present

## 2019-12-27 DIAGNOSIS — M48062 Spinal stenosis, lumbar region with neurogenic claudication: Secondary | ICD-10-CM

## 2019-12-27 DIAGNOSIS — R296 Repeated falls: Secondary | ICD-10-CM | POA: Diagnosis not present

## 2019-12-27 MED ORDER — ASPIRIN EC 81 MG PO TBEC: 81.0000 mg | DELAYED_RELEASE_TABLET | Freq: Every day | ORAL | 11 refills | Status: AC

## 2019-12-27 NOTE — Progress Notes (Signed)
[facility]    Provider:  Dr Jaynee Eagles Requesting Provider: Colon Branch, MD Primary Care Provider:  Colon Branch, MD  CC:  Neuropathy  HPI:  Monique Ballard is a 79 y.o. female here as requested by Colon Branch, MD for neuropathy.  Past medical history hypertension, depression, remote breast cancer status post lumpectomy, acute respiratory failure with recent hospitalization which involved ingestion of alcohol possibly, gait disorder, tremor, alcohol dependency, osteopenia.  She is currently on thiamine and folic acid supplementation.  I reviewed Dr. Gilberto Better note, she was admitted to the hospital in September, prior to admission the patient was gradually deteriorating, not getting out of bed, daughter suspected she was not eating much and was drinking alcohol, at the emergency room she was found to be hypoxic requiring oxygen via nasal cannula, CT angiogram no pulmonary emboli, chest x-ray with no infiltrates, EKG sinus tachycardia, she received Solu-Medrol and bronchodilator therapy, respiratory failure resolved, hypokalemia resolved, troponin slightly elevated.  When she saw Dr. Past she also admitted drinking alcohol prior to admission and staying in the bed most of the time, she still reports unsteadiness needing a cane, denies lower extremity paresthesias, gait disorder unsteady onset since 2017.  MRI of the brain showed microvascular ischemic changes, chronic small vessel infarct of the right caudate, generalized parenchymal volume loss.  After review of epic notes, she has also been to the emergency room for falls for example in October 2021, May 2020, she is also been on antidopaminergic medications such as Abilify in the past for mood disorder, had elevated LFTs presumably alcohol .  Note from 2017 for gait disorder by Dr. Larose Kells appears as though she was referred to physical therapy did not pursue it at the time, also a tremor diagnosed November 2016, gait noted is unsteady without any  further details.  Patient is here alone and she reports: She has some "sore toes", she denies any numbness or tingling in her feet and so she is unsure why she was referred for neuropathy. She is very unsteady on her feet. She denies any focal weakness. She is unsteady, she uses the cane to "catch herself", when she wakes up she is very unsteady and it gets better as the day goes on. She doesn't feel secure. She has been doing exercises from PT (her daughter) for legs and feel like getting stronger, she is not driving anymore, her husband drives (she says she has a Electronics engineer), October was the last time she fell and now she uses a cane, she has some tiredness I the low back, her low back aches, legs feel fatigued after a while of walking and she has to sit down, just standing alone doesn't make it better. No numbness or tingling, feet do not get cold, she wears socks, no freezing, no stiffness other than the low back, she feels wobbly in both legs, arms are k no problems with the arms lifting them overhead. She does not think voice has become softer. But her handwriting is terrible and smaller. She feels she is walking slower because she is being more careful.    Reviewed notes, labs and imaging from outside physicians, which showed:  Vitamin B1 and folate deficiency 09/2019 05/12/2018: ethanol 46 (nml < 10) tsh 2019 2.27 03/10/2017 01/2016 hgba1c 5.4  MRI brain 11/15/2019: No evidence of recent infarction, hemorrhage, or mass. Chronic microvascular ischemic changes.  CT cervical spine: Disc levels: Loss of intervertebral disc height at C5-6, C6-7 and less so C4-5.  Multifactorial right neural foraminal narrowing at C4-5, bilateral neural foraminal narrowing at C5-6 and left greater than right neural foraminal narrowing at C6-7. Mild central canal stenosis at C4-5 and C5-6  B12 587, folate 1.7 B1 < 6   Review of Systems: Patient complains of symptoms per HPI as well as the following symptoms:falls, leg  weakness. Pertinent negatives and positives per HPI. All others negative.   Social History   Socioeconomic History  . Marital status: Married    Spouse name: Not on file  . Number of children: 2  . Years of education: Not on file  . Highest education level: Bachelor's degree (e.g., BA, AB, BS)  Occupational History  . Occupation: retired  Tobacco Use  . Smoking status: Former Smoker    Packs/day: 0.50    Years: 25.00    Pack years: 12.50    Types: Cigarettes    Quit date: 1990    Years since quitting: 31.9  . Smokeless tobacco: Never Used  Vaping Use  . Vaping Use: Never used  Substance and Sexual Activity  . Alcohol use: Yes    Alcohol/week: 7.0 standard drinks    Types: 7 Glasses of wine per week    Comment: 1 glass wine dialy  . Drug use: No  . Sexual activity: Never    Birth control/protection: None  Other Topics Concern  . 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 Difficulty of Paying Living Expenses: Not hard at all  Food Insecurity: No Food Insecurity  . Worried About Charity fundraiser in the Last Year: Never true  . Ran Out of Food in the Last Year: Never true  Transportation Needs: No Transportation Needs  . Lack of Transportation (Medical): No  . Lack of Transportation (Non-Medical): No  Physical Activity:   . Days of Exercise per Week: Not on file  . Minutes of Exercise per Session: Not on file  Stress:   . Feeling of Stress : Not on file  Social Connections:   . Frequency of Communication with Friends and Family: Not on file  . Frequency of Social Gatherings with Friends and Family: Not on file  .  Attends Religious Services: Not on file  . Active Member of Clubs or Organizations: Not on file  . Attends Archivist Meetings: Not on file  . Marital Status: Not on file  Intimate Partner Violence:   . Fear of Current or Ex-Partner: Not on file  . Emotionally Abused: Not on file  . Physically Abused: Not on file  . Sexually Abused: Not on file    Family History  Problem Relation Age of Onset  . Hypertension Mother   . Stroke Mother   . Heart attack Father        elderly  . Heart disease Father        CHF  . Throat cancer Other   . Breast cancer Neg Hx   . Colon cancer Neg Hx   . Diabetes Neg Hx   . Neuropathy Neg Hx  Past Medical History:  Diagnosis Date  . Anxiety   . Breast cancer ([place]) fall 1997    Left Lumpectomy, XRT.  treated with Tamoxifem and Evista  . Depression   . Endometriosis in her 20's   surgery to help clear endometriosis to obtain a pregnancy.  Marland Kitchen GERD (gastroesophageal reflux disease)    w/"stretching" remotely  . HTN (hypertension)   . Migraines     Patient Active Problem List   Diagnosis Date Noted  . Hypokalemia 10/01/2019  . Acute respiratory failure ([place]) 09/30/2019  . Alcohol dependency ([place]) 05/15/2018  . Tremor 02/24/2015  . Gait disorder 02/24/2015  . PCP NOTES >>>>>>> 12/13/2014  . Abdominal wall strain 03/19/2012  . Osteopenia 10/04/2011  . Annual physical exam 09/07/2010  . GERD 08/28/2009  . Anxiety and depression 10/06/2006  . Essential hypertension 10/06/2006  . BREAST CANCER, HX OF 10/06/2006    Past Surgical History:  Procedure Laterality Date  . BREAST LUMPECTOMY Left fall 1997   Lymph nodes X 3 were negative, ER+, Radiation treatmetn.  Took Tamoxifem X 5 yrs then Evista for 3-5 yrs.  . CESAREAN SECTION     x2  first pregnancy breach and second normal  . surgery for endometriosis      Current Outpatient Medications  Medication Sig Dispense Refill  . escitalopram (LEXAPRO) 20 MG tablet TAKE ONE TABLET  BY MOUTH ONE TIME DAILY 90 tablet 0  . folic acid (FOLVITE) 1 MG tablet Take 1 tablet (1 mg total) by mouth daily. 30 tablet 6  . OLANZapine (ZYPREXA) 7.5 MG tablet TAKE ONE TABLET BY MOUTH DAILY AT BEDTIME 90 tablet 0  . ondansetron (ZOFRAN) 8 MG tablet Take 1 tablet (8 mg total) by mouth every 8 (eight) hours as needed for nausea or vomiting. 21 tablet 0  . pantoprazole (PROTONIX) 40 MG tablet Take 1 tablet (40 mg total) by mouth daily. 90 tablet 3  . thiamine 100 MG tablet Take 1 tablet (100 mg total) by mouth daily. 30 tablet 6  . aspirin EC 81 MG tablet Take 1 tablet (81 mg total) by mouth daily. Swallow whole. 30 tablet 11   No current facility-administered medications for this visit.    Allergies as of 12/27/2019 - Review Complete 12/27/2019  Allergen Reaction Noted  . Penicillins Rash 02/17/2006    Vitals: BP 114/77 (BP Location: Left Arm, Patient Position: Sitting)   Pulse 85   Ht 5\' 2"  (1.575 m)   Wt 134 lb (60.8 kg)   LMP 01/22/1992 (Within Months)   BMI 24.51 kg/m  Last Weight:  Wt Readings from Last 1 Encounters:  12/27/19 134 lb (60.8 kg)   Last Height:   Ht Readings from Last 1 Encounters:  12/27/19 5\' 2"  (1.575 m)     Physical exam: Exam: Gen: NAD, conversant, thin appears frail, well groomed                     CV: RRR, no MRG. No Carotid Bruits. No peripheral edema, warm, nontender Eyes: Conjunctivae clear without exudates or hemorrhage  Neuro: Detailed Neurologic Exam  Speech:    Speech is normal; fluent and spontaneous with normal comprehension.  Cognition:    The patient is oriented to person, place, and time;     recent and remote memory intact;     language fluent;     normal attention, concentration,     fund of knowledge Cranial Nerves: slight hypomimia?    The pupils are equal,  round, and reactive to light. The fundi are normal and spontaneous venous pulsations are present. Visual fields are full to finger confrontation. Extraocular  movements are intact. Trigeminal sensation is intact and the muscles of mastication are normal. The face is symmetric. The palate elevates in the midline. Hearing intact. Voice is normal. Shoulder shrug is normal. The tongue has normal motion without fasciculations.   Coordination:    Normal finger to nose and heel to shin, bradykinesias   Gait:    Low clearance, decreased arm swing  Motor Observation:    No asymmetry, no atrophy, minimal postural tremor Tone:    Normal muscle tone, no cogwheeling  Posture:    Posture is normal. normal erect    Strength: generally 4-4+ throughout but no focal weakness     Sensation: intact to LT, pinprick and vibration distally in the feet     Reflex Exam:  DTR's:    Deep tendon reflexes in the upper and lower extremities are 2+ bilaterally.   Toes:    The toes are equiv bilaterally.   Clonus:    Clonus is absent.    Assessment/Plan:  79 y.o. female here as requested by Colon Branch, MD for neuropathy.  Past medical history hypertension, depression, remote breast cancer status post lumpectomy, acute respiratory failure with recent hospitalization which involved ingestion of alcohol possibly, gait disorder with falls, tremor, alcohol dependency, osteopenia.  She is currently on thiamine and folic acid supplementation.   Gait abnormality/ Falls: Likely deconditioning, alcohol abuse, Her gait also shows some decreased arm swing and low stride clearance but no rigidity or concerning tremor. She has been on anti-dopaminergic drugs in the past which can cause this however we will follow clinically and watch for progression of her gait disorder.   MRI lumbar spine to evaluate for any spinal stenosis   Referred for neuropathy but sensation is actually good: She surprisingly has intact pin prick and vibration in the feet, intact AJ reflexes despite her alcohol abuse and b1 and folic acid deficiency. B12 normal.   Suggest asa 81mg  every day (in the brain  we saw a small old lacunar infarct) discussed risk of bleeding and falls  Orders Placed This Encounter  Procedures  . MR LUMBAR SPINE WO CONTRAST   Meds ordered this encounter  Medications  . aspirin EC 81 MG tablet    Sig: Take 1 tablet (81 mg total) by mouth daily. Swallow whole.    Dispense:  30 tablet    Refill:  11    Cc: Paz, Alda Berthold, MD,  Colon Branch, MD  Sarina Ill, MD  [facility] [address] [place], [place] [address]  Phone [phone] Fax [phone]

## 2019-12-27 NOTE — Patient Instructions (Addendum)
MRI lumbar spine  ASA 81mg     It is important to avoid accidents which may result in broken bones.  Here are a few ideas on how to make your home safer so you will be less likely to trip or fall.  1. Use nonskid mats or non slip strips in your shower or tub, on your bathroom floor and around sinks.  If you know that you have spilled water, wipe it up! 2. In the bathroom, it is important to have properly installed grab bars on the walls or on the edge of the tub.  Towel racks are NOT strong enough for you to hold onto or to pull on for support. 3. Stairs and hallways should have enough light.  Add lamps or night lights if you need ore light. 4. It is good to have handrails on both sides of the stairs if possible.  Always fix broken handrails right away. 5. It is important to see the edges of steps.  Paint the edges of outdoor steps white so you can see them better.  Put colored tape on the edge of inside steps. 6. Throw-rugs are dangerous because they can slide.  Removing the rugs is the best idea, but if they must stay, add adhesive carpet tape to prevent slipping. 7. Do not keep things on stairs or in the halls.  Remove small furniture that blocks the halls as it may cause you to trip.  Keep telephone and electrical cords out of the way where you walk. 8. Always were sturdy, rubber-soled shoes for good support.  Never wear just socks, especially on the stairs.  Socks may cause you to slip or fall.  Do not wear full-length housecoats as you can easily trip on the bottom.  9. Place the things you use the most on the shelves that are the easiest to reach.  If you use a stepstool, make sure it is in good condition.  If you feel unsteady, DO NOT climb, ask for help. 10. If a health professional advises you to use a cane or walker, do not be ashamed.  These items can keep you from falling and breaking your bones.

## 2019-12-27 NOTE — Telephone Encounter (Signed)
Medicare/mutual of [place] order sent to GI. No auth they will reach out to the patient to schedule.  

## 2020-01-03 ENCOUNTER — Telehealth (HOSPITAL_COMMUNITY): Payer: Self-pay | Admitting: Pulmonary Disease

## 2020-01-03 NOTE — Telephone Encounter (Signed)
Just an FYI. We have made several attempts to contact this patient including sending a letter to schedule or reschedule their echocardiogram. We will be removing the patient from the echo [place].  12/23/19 MAILED LETTER [facility]  12/20/19 [facility] to schedule @ 3:26/[facility]  12/13/19 [facility] to schedule @ 11:17/[facility]  12/06/19 [facility] to schedule @ 10:24/[facility]        Thank you

## 2020-01-06 ENCOUNTER — Telehealth: Payer: Self-pay | Admitting: Internal Medicine

## 2020-01-06 NOTE — Progress Notes (Signed)
  Chronic Care Management   Outreach Note  01/06/2020 Name: Monique Ballard MRN: [id number] DOB: 01/08/1941  Referred by: Colon Branch, MD Reason for referral : No chief complaint on file.   A second unsuccessful telephone outreach was attempted today. The patient was referred to pharmacist for assistance with care management and care coordination.  Follow Up Plan:   Carley Perdue [employer] Scheduler

## 2020-03-03 ENCOUNTER — Ambulatory Visit (INDEPENDENT_AMBULATORY_CARE_PROVIDER_SITE_OTHER): Payer: Medicare Other | Admitting: Internal Medicine

## 2020-03-03 ENCOUNTER — Encounter: Payer: Self-pay | Admitting: Internal Medicine

## 2020-03-03 ENCOUNTER — Other Ambulatory Visit: Payer: Self-pay

## 2020-03-03 VITALS — BP 106/74 | HR 94 | Temp 98.1°F | Ht 62.0 in | Wt 123.9 lb

## 2020-03-03 DIAGNOSIS — R634 Abnormal weight loss: Secondary | ICD-10-CM

## 2020-03-03 DIAGNOSIS — R296 Repeated falls: Secondary | ICD-10-CM

## 2020-03-03 DIAGNOSIS — R1906 Epigastric swelling, mass or lump: Secondary | ICD-10-CM

## 2020-03-03 DIAGNOSIS — F1020 Alcohol dependence, uncomplicated: Secondary | ICD-10-CM | POA: Diagnosis not present

## 2020-03-03 DIAGNOSIS — R269 Unspecified abnormalities of gait and mobility: Secondary | ICD-10-CM

## 2020-03-03 DIAGNOSIS — G629 Polyneuropathy, unspecified: Secondary | ICD-10-CM | POA: Diagnosis not present

## 2020-03-03 DIAGNOSIS — I10 Essential (primary) hypertension: Secondary | ICD-10-CM

## 2020-03-03 NOTE — Progress Notes (Signed)
Pre visit review using our clinic review tool, if applicable. No additional management support is needed unless otherwise documented below in the visit note. 

## 2020-03-03 NOTE — Progress Notes (Signed)
Subjective:    Patient ID: Monique Ballard, female    DOB: 03-Oct-1940, 80 y.o.   MRN: [id number]  DOS:  03/03/2020 Type of visit - description: f/u  Since the last office visit saw pulmonary and neurology, notes reviewed.  I noted weight loss, she reported that is simply not hungry. Pt denies fever chills or headaches. No nausea, vomiting, diarrhea.  No postprandial abdominal pain.  No dysphagia or odynophagia. Not taking NSAIDs. Cough is at baseline.  EtOH: Still drinking  Gait disorder: Just near falls since the last visit.   Wt Readings from Last 3 Encounters:  03/03/20 123 lb 14.4 oz (56.2 kg)  12/27/19 134 lb (60.8 kg)  12/08/19 137 lb 9.6 oz (62.4 kg)     Review of Systems See above   Past Medical History:  Diagnosis Date  . Anxiety   . Breast cancer ([place]) fall 1997    Left Lumpectomy, XRT.  treated with Tamoxifem and Evista  . Depression   . Endometriosis in her 20's   surgery to help clear endometriosis to obtain a pregnancy.  Marland Kitchen GERD (gastroesophageal reflux disease)    w/"stretching" remotely  . HTN (hypertension)   . Migraines     Past Surgical History:  Procedure Laterality Date  . BREAST LUMPECTOMY Left fall 1997   Lymph nodes X 3 were negative, ER+, Radiation treatmetn.  Took Tamoxifem X 5 yrs then Evista for 3-5 yrs.  . CESAREAN SECTION     x2  first pregnancy breach and second normal  . surgery for endometriosis      Allergies as of 03/03/2020      Reactions   Penicillins Rash   Has patient had a PCN reaction causing immediate rash, facial/tongue/throat swelling, SOB or lightheadedness with hypotension: Yes Has patient had a PCN reaction causing severe rash involving mucus membranes or skin necrosis: No Has patient had a PCN reaction that required hospitalization No Has patient had a PCN reaction occurring within the last 10 years: No If all of the above answers are "NO", then may proceed with Cephalosporin use.      Medication List        Accurate as of March 03, 2020  2:16 PM. If you have any questions, ask your nurse or doctor.        aspirin EC 81 MG tablet Take 1 tablet (81 mg total) by mouth daily. Swallow whole.   escitalopram 20 MG tablet Commonly known as: LEXAPRO TAKE ONE TABLET BY MOUTH ONE TIME DAILY   folic acid 1 MG tablet Commonly known as: FOLVITE Take 1 tablet (1 mg total) by mouth daily.   OLANZapine 7.5 MG tablet Commonly known as: ZYPREXA TAKE ONE TABLET BY MOUTH DAILY AT BEDTIME   ondansetron 8 MG tablet Commonly known as: ZOFRAN Take 1 tablet (8 mg total) by mouth every 8 (eight) hours as needed for nausea or vomiting.   pantoprazole 40 MG tablet Commonly known as: PROTONIX Take 1 tablet (40 mg total) by mouth daily.   thiamine 100 MG tablet Take 1 tablet (100 mg total) by mouth daily.          Objective:   Physical Exam Abdominal:      BP 106/74 (BP Location: Left Arm, Patient Position: Sitting, Cuff Size: Large)   Pulse 94   Temp 98.1 F (36.7 C) (Oral)   Ht 5\' 2"  (1.575 m)   Wt 123 lb 14.4 oz (56.2 kg)   LMP 01/22/1992 (Within Months)  SpO2 93%   BMI 22.66 kg/m  General:   Well developed, NAD, BMI noted.  HEENT:  Normocephalic . Face symmetric, atraumatic Neck: No thyromegaly Lymphatic system: No enlarged lymph nodes at the neck, axillary or inguinal areas Lungs:  CTA B Normal respiratory effort, no intercostal retractions, no accessory muscle use. Heart: RRR,  no murmur.  Abdomen:  Not distended, soft, at the epigastric area I feel a nontender mass (?), R and L upper quadrants soft nontender. Skin: Not pale. Not jaundice Lower extremities: no pretibial edema bilaterally  Neurologic:  alert & oriented X3.  Speech normal, gait: Somewhat hesitant, uses a cane, needs help transferring Psych--  Cognition and judgment appear intact.  Cooperative with normal attention span and concentration.  Behavior appropriate. No anxious or depressed appearing.      Assessment     Assessment HTN Depression, Anxiety - Dr Clovis Pu  Tremor dx [phone] Gait d/o: unsteady onset ~ 2017 GERD with esophageal stricture L Breast cancer, 1997, lumpectomy, XRT, released from oncology ETOH  PLAN: Hypoxia: Saw pulmonary 12/08/2019: At the time heart rate was 113, O2 sat 88 at the clinic, shunt physiology?  Hepatopulmonary syndrome? Echo with bubbles was Rx. Has not pursued the echo.  "Nobody called me" Gait d/o: Uses a cane, no recent falls, would like PT, will arrange Neuropathy  Saw neurology 12/27/2019, they felt that gait disorder could be deconditioning, related to EtOH, MRI was ordered to eval spinal stenosis.  MRI not pursued. ASA: rec by neuro d/t infarct seen on MRI of the brain. Weight loss: Etiology not completely clear, recheck a CBC, A1c, TSH Epigastric abdominal mass: We will start w/u  with abdominal ultrasound and CMP HTN: No amb BPs, BP today is on the low side, on no meds EtOH: States she drinks "2 glasses of wine a day" previously she reported   "1 glass of wine"  Depression, anxiety: PHQ-9 --- 9 Lack of appetite: See comments under weight loss and abdominal mass. RTC 4 weeks   This visit occurred during the SARS-CoV-2 public health emergency.  Safety protocols were in place, including screening questions prior to the visit, additional usage of staff PPE, and extensive cleaning of exam room while observing appropriate contact time as indicated for disinfecting solutions.

## 2020-03-03 NOTE — Patient Instructions (Addendum)
GO TO THE LAB : Get the blood work   Wilson, Volin Come back for a checkup in 4 weeks STOP BY THE FIRST FLOOR:  Schedule the ultrasound of your    Advance Directive  Advance directives are legal documents that allow you to make decisions about your health care and medical treatment in case you become unable to communicate for yourself. Advance directives let your wishes be known to family, friends, and health care providers. Discussing and writing advance directives should happen over time rather than all at once. Advance directives can be changed and updated at any time. There are different types of advance directives, such as:  Medical power of attorney.  Living will.  Do not resuscitate (DNR) order or do not attempt resuscitation (DNAR) order. Health care proxy and medical power of attorney A health care proxy is also called a health care agent. This person is appointed to make medical decisions for you when you are unable to make decisions for yourself. Generally, people ask a trusted friend or family member to act as their proxy and represent their preferences. Make sure you have an agreement with your trusted person to act as your proxy. A proxy may have to make a medical decision on your behalf if your wishes are not known. A medical power of attorney, also called a durable power of attorney for health care, is a legal document that names your health care proxy. Depending on the laws in your state, the document may need to be:  Signed.  Notarized.  Dated.  Copied.  Witnessed.  Incorporated into your medical record. You may also want to appoint a trusted person to manage your money in the event you are unable to do so. This is called a durable power of attorney for finances. It is a separate legal document from the durable power of attorney for health care. You may choose your health care proxy or someone different to act as your agent  in money matters. If you do not appoint a proxy, or there is a concern that the proxy is not acting in your best interest, a court may appoint a guardian to act on your behalf. Living will A living will is a set of instructions that state your wishes about medical care when you cannot express them yourself. Health care providers should keep a copy of your living will in your medical record. You may want to give a copy to family members or friends. To alert caregivers in case of an emergency, you can place a card in your wallet to let them know that you have a living will and where they can find it. A living will is used if you become:  Terminally ill.  Disabled.  Unable to communicate or make decisions. The following decisions should be included in your living will:  To use or not to use life support equipment, such as dialysis machines and breathing machines (ventilators).  Whether you want a DNR or DNAR order. This tells health care providers not to use cardiopulmonary resuscitation (CPR) if breathing or heartbeat stops.  To use or not to use tube feeding.  To be given or not to be given food and fluids.  Whether you want comfort (palliative) care when the goal becomes comfort rather than a cure.  Whether you want to donate your organs and tissues. A living will does not give instructions for distributing your money and property if you should  pass away. DNR or DNAR A DNR or DNAR order is a request not to have CPR in the event that your heart stops beating or you stop breathing. If a DNR or DNAR order has not been made and shared, a health care provider will try to help any patient whose heart has stopped or who has stopped breathing. If you plan to have surgery, talk with your health care provider about how your DNR or DNAR order will be followed if problems occur. What if I do not have an advance directive? Some states assign family decision makers to act on your behalf if you do not  have an advance directive. Each state has its own laws about advance directives. You may want to check with your health care provider, attorney, or state representative about the laws in your state. Summary  Advance directives are legal documents that allow you to make decisions about your health care and medical treatment in case you become unable to communicate for yourself.  The process of discussing and writing advance directives should happen over time. You can change and update advance directives at any time.  Advance directives may include a medical power of attorney, a living will, and a DNR or DNAR order. This information is not intended to replace advice given to you by your health care provider. Make sure you discuss any questions you have with your health care provider. Document Revised: 10/12/2019 Document Reviewed: 10/12/2019 Elsevier Patient Education  2021 [employer].

## 2020-03-04 LAB — COMPREHENSIVE METABOLIC PANEL
AG Ratio: 0.9 (calc) — ABNORMAL LOW (ref 1.0–2.5)
ALT: 24 U/L (ref 6–29)
AST: 58 U/L — ABNORMAL HIGH (ref 10–35)
Albumin: 2.9 g/dL — ABNORMAL LOW (ref 3.6–5.1)
Alkaline phosphatase (APISO): 80 U/L (ref 37–153)
BUN/Creatinine Ratio: 8 (calc) (ref 6–22)
BUN: 6 mg/dL — ABNORMAL LOW (ref 7–25)
CO2: 23 mmol/L (ref 20–32)
Calcium: 8.7 mg/dL (ref 8.6–10.4)
Chloride: 106 mmol/L (ref 98–110)
Creat: 0.71 mg/dL (ref 0.60–0.93)
Globulin: 3.1 g/dL (calc) (ref 1.9–3.7)
Glucose, Bld: 101 mg/dL — ABNORMAL HIGH (ref 65–99)
Potassium: 4.3 mmol/L (ref 3.5–5.3)
Sodium: 141 mmol/L (ref 135–146)
Total Bilirubin: 1 mg/dL (ref 0.2–1.2)
Total Protein: 6 g/dL — ABNORMAL LOW (ref 6.1–8.1)

## 2020-03-04 LAB — CBC WITH DIFFERENTIAL/PLATELET
Absolute Monocytes: 1094 cells/uL — ABNORMAL HIGH (ref 200–950)
Basophils Absolute: 58 cells/uL (ref 0–200)
Basophils Relative: 0.6 %
Eosinophils Absolute: 10 cells/uL — ABNORMAL LOW (ref 15–500)
Eosinophils Relative: 0.1 %
HCT: 34 % — ABNORMAL LOW (ref 35.0–45.0)
Hemoglobin: 11.4 g/dL — ABNORMAL LOW (ref 11.7–15.5)
Lymphs Abs: 1920 cells/uL (ref 850–3900)
MCH: 32.5 pg (ref 27.0–33.0)
MCHC: 33.5 g/dL (ref 32.0–36.0)
MCV: 96.9 fL (ref 80.0–100.0)
MPV: 11.7 fL (ref 7.5–12.5)
Monocytes Relative: 11.4 %
Neutro Abs: 6518 cells/uL (ref 1500–7800)
Neutrophils Relative %: 67.9 %
Platelets: 291 10*3/uL (ref 140–400)
RBC: 3.51 10*6/uL — ABNORMAL LOW (ref 3.80–5.10)
RDW: 12 % (ref 11.0–15.0)
Total Lymphocyte: 20 %
WBC: 9.6 10*3/uL (ref 3.8–10.8)

## 2020-03-04 LAB — TSH: TSH: 3.26 mIU/L (ref 0.40–4.50)

## 2020-03-04 LAB — HEMOGLOBIN A1C
Hgb A1c MFr Bld: 4.1 % of total Hgb (ref <5.7)
Mean Plasma Glucose: 71 mg/dL
eAG (mmol/L): 3.9 mmol/L

## 2020-03-05 NOTE — Assessment & Plan Note (Signed)
Hypoxia: Saw pulmonary 12/08/2019: At the time heart rate was 113, O2 sat 88 at the clinic, shunt physiology?  Hepatopulmonary syndrome? Echo with bubbles was Rx. Has not pursued the echo.  "Nobody called me" Gait d/o: Uses a cane, no recent falls, would like PT, will arrange Neuropathy  Saw neurology 12/27/2019, they felt that gait disorder could be deconditioning, related to EtOH, MRI was ordered to eval spinal stenosis.  MRI not pursued. ASA: rec by neuro d/t infarct seen on MRI of the brain. Weight loss: Etiology not completely clear, recheck a CBC, A1c, TSH Epigastric abdominal mass: We will start w/u  with abdominal ultrasound and CMP HTN: No amb BPs, BP today is on the low side, on no meds EtOH: States she drinks "2 glasses of wine a day" previously she reported   "1 glass of wine"  Depression, anxiety: PHQ-9 --- 9 Lack of appetite: See comments under weight loss and abdominal mass. RTC 4 weeks

## 2020-03-07 ENCOUNTER — Ambulatory Visit (HOSPITAL_BASED_OUTPATIENT_CLINIC_OR_DEPARTMENT_OTHER)
Admission: RE | Admit: 2020-03-07 | Discharge: 2020-03-07 | Disposition: A | Discharge status: Home / Self Care | Payer: Medicare Other | Source: Ambulatory Visit | Attending: Internal Medicine | Admitting: Internal Medicine

## 2020-03-07 ENCOUNTER — Other Ambulatory Visit: Payer: Self-pay

## 2020-03-07 DIAGNOSIS — R634 Abnormal weight loss: Secondary | ICD-10-CM | POA: Diagnosis present

## 2020-03-07 DIAGNOSIS — F1020 Alcohol dependence, uncomplicated: Secondary | ICD-10-CM | POA: Insufficient documentation

## 2020-03-07 DIAGNOSIS — R1906 Epigastric swelling, mass or lump: Secondary | ICD-10-CM | POA: Diagnosis present

## 2020-03-07 DIAGNOSIS — K760 Fatty (change of) liver, not elsewhere classified: Secondary | ICD-10-CM | POA: Diagnosis not present

## 2020-03-07 IMAGING — US US ABDOMEN COMPLETE
1 series · 13 of 25 positions shown · non-contrast
Comparison: CT abdomen pelvis [DATE]

CLINICAL DATA: Suspected epigastric mass.  Alcohol dependence.

EXAM:
ABDOMEN ULTRASOUND COMPLETE

[Series 1: us abdomen complete · 13 of 70 slices shown]
[im 1/70]
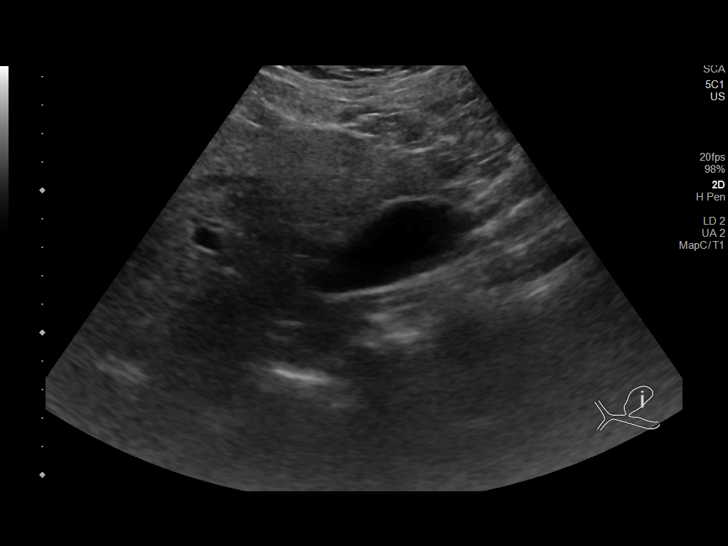
[im 6/70]
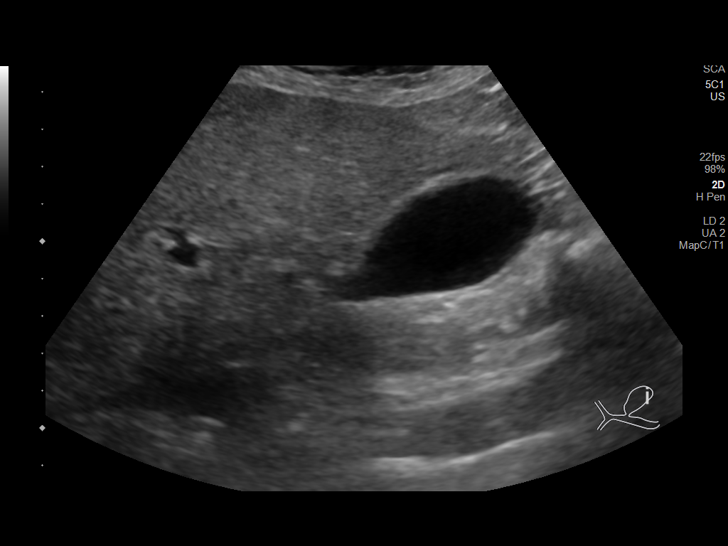
[im 12/70]
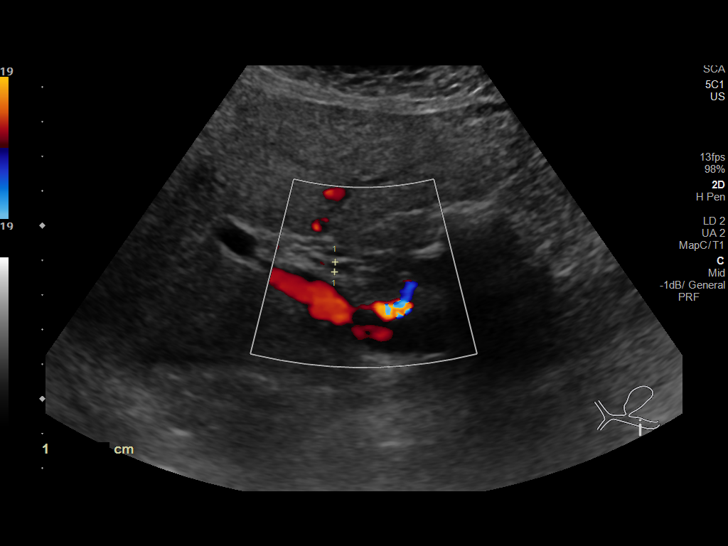
[im 18/70]
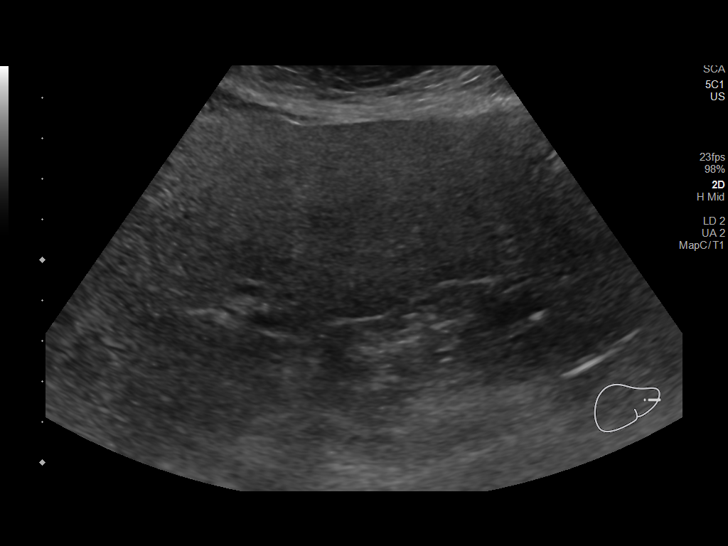
[im 24/70]
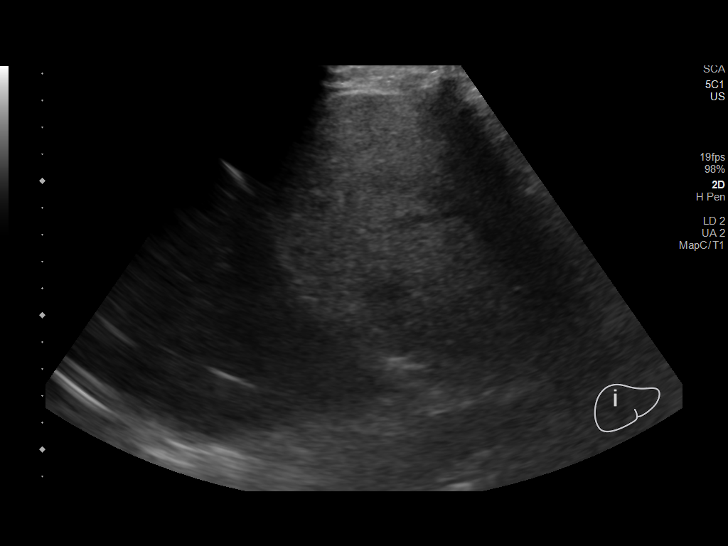
[im 29/70]
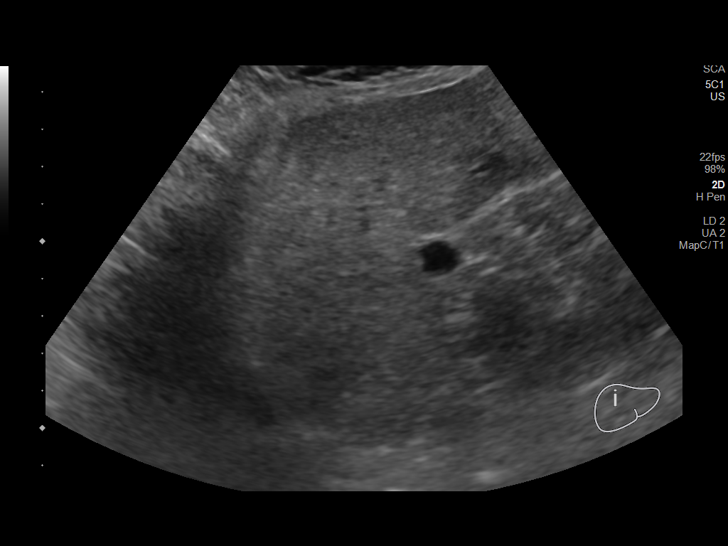
[im 35/70]
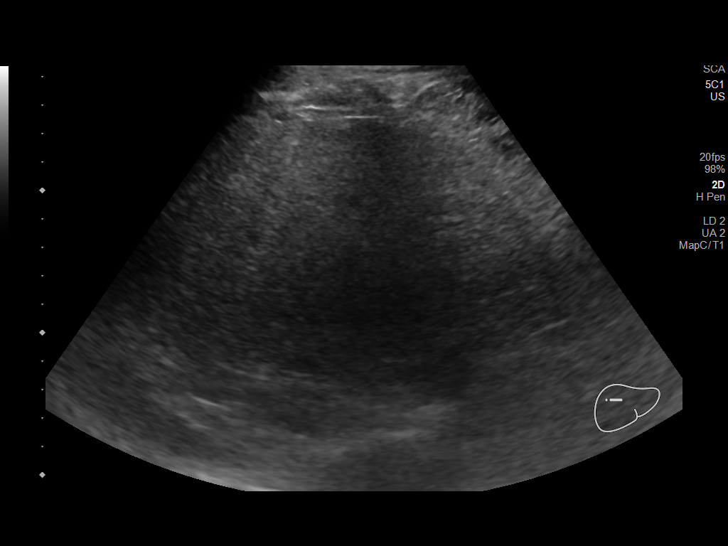
[im 41/70]
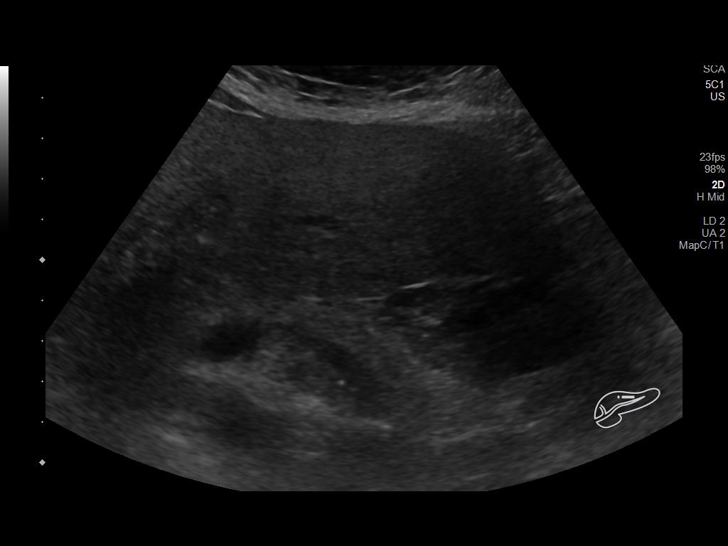
[im 47/70]
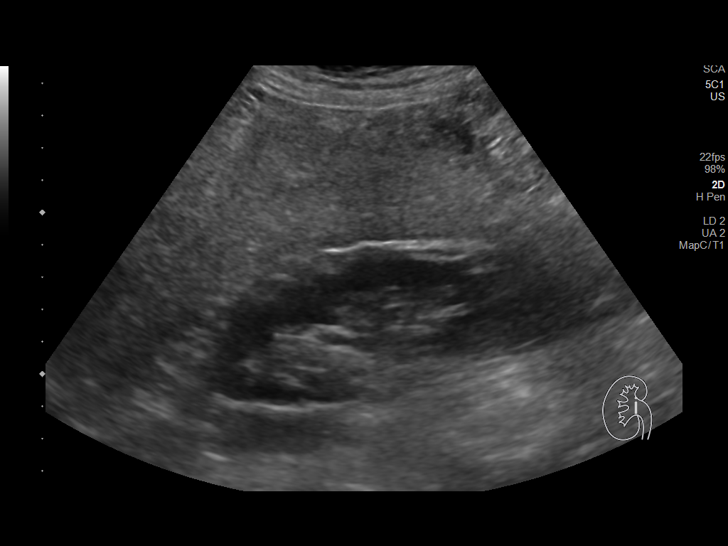
[im 52/70]
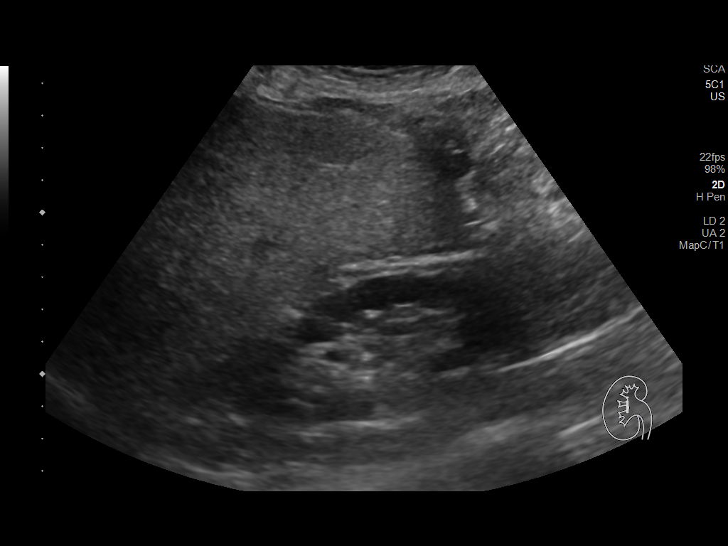
[im 58/70]
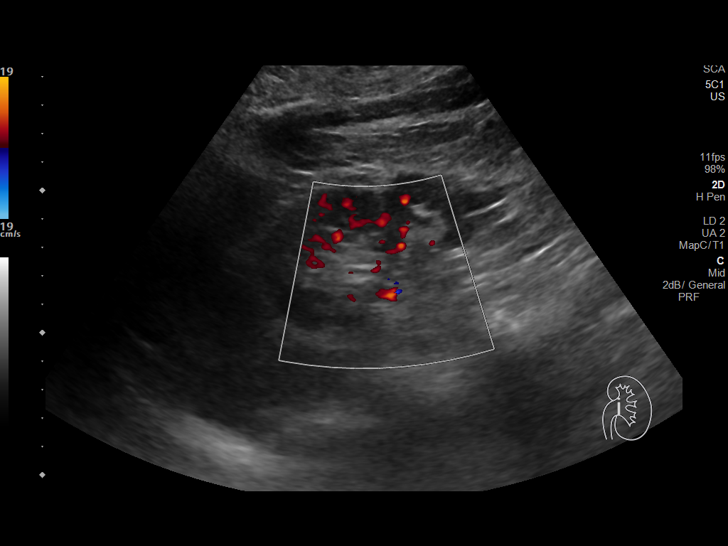
[im 64/70]
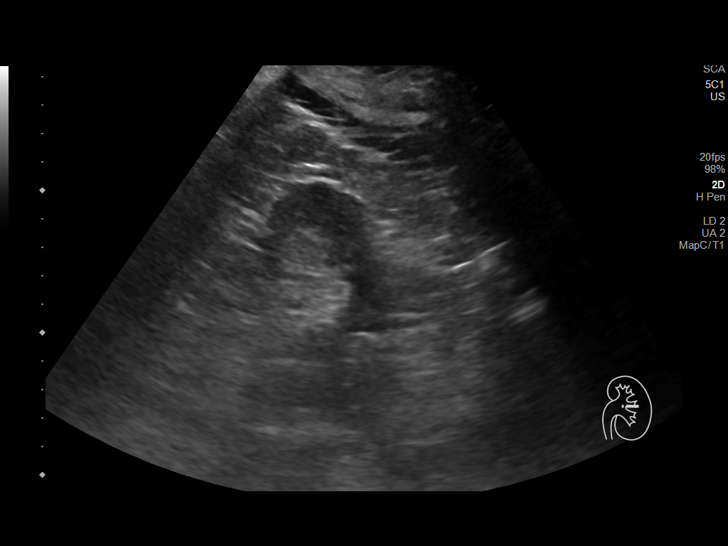
[im 70/70]
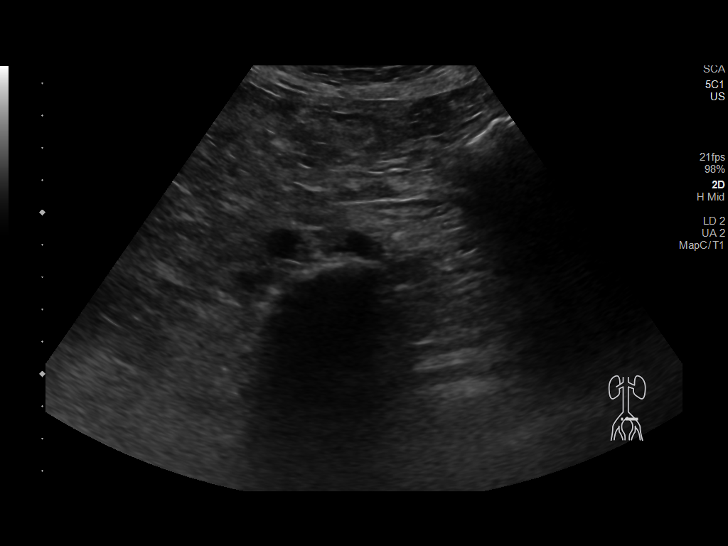

[13 of 25 positions shown; findings below may reference images not displayed]

FINDINGS: Gallbladder: No gallstones or wall thickening visualized. No
sonographic Murphy sign noted by sonographer.

Common bile duct: Diameter: 3 mm

Liver: Enlarged in size. No focal lesion identified. Increased
parenchymal echogenicity. Portal vein is patent on color Doppler
imaging with normal direction of blood flow towards the liver.

IVC: No abnormality visualized.

Pancreas: Visualized portion unremarkable.

Spleen: Size and appearance within normal limits.

Right Kidney: Length: 9.4 cm. Echogenicity within normal limits. No
mass or hydronephrosis visualized.

Left Kidney: Length: 9.3 cm. Echogenicity within normal limits. No
mass or hydronephrosis visualized.

Abdominal aorta: No aneurysm visualized.

Other findings: At least trace volume right pleural effusion.
IMPRESSION: 1. Hepatic steatosis. Please note limited evaluation for focal
hepatic masses in a patient with hepatic steatosis due to decreased
penetration of the acoustic ultrasound waves. Recommend MRI liver
protocol for further evaluation.
2. At least trace right pleural effusion.
3. Bilateral kidneys measure at the lower limit of normal in size.

## 2020-03-13 ENCOUNTER — Other Ambulatory Visit: Payer: Self-pay | Admitting: Psychiatry

## 2020-03-13 DIAGNOSIS — F331 Major depressive disorder, recurrent, moderate: Secondary | ICD-10-CM

## 2020-03-13 DIAGNOSIS — F4001 Agoraphobia with panic disorder: Secondary | ICD-10-CM

## 2020-03-13 DIAGNOSIS — F411 Generalized anxiety disorder: Secondary | ICD-10-CM

## 2020-03-13 NOTE — Telephone Encounter (Signed)
refill 

## 2020-03-13 NOTE — Telephone Encounter (Signed)
Pt has not been seen since 02/11/19.Should I send Rx with no refills,and have admin staff schedule her an appointment?

## 2020-03-13 NOTE — Telephone Encounter (Signed)
Please schedule a appt.

## 2020-03-14 ENCOUNTER — Encounter: Payer: Self-pay | Admitting: Neurology

## 2020-03-31 ENCOUNTER — Ambulatory Visit: Payer: Medicare Other | Admitting: Internal Medicine

## 2020-04-17 ENCOUNTER — Other Ambulatory Visit: Payer: Self-pay | Admitting: Psychiatry

## 2020-04-17 DIAGNOSIS — F411 Generalized anxiety disorder: Secondary | ICD-10-CM

## 2020-04-17 DIAGNOSIS — F4001 Agoraphobia with panic disorder: Secondary | ICD-10-CM

## 2020-04-17 DIAGNOSIS — F331 Major depressive disorder, recurrent, moderate: Secondary | ICD-10-CM

## 2020-04-18 ENCOUNTER — Encounter: Payer: Self-pay | Admitting: Internal Medicine

## 2020-04-18 NOTE — Telephone Encounter (Signed)
Review.

## 2020-04-18 NOTE — Telephone Encounter (Signed)
Call to RS

## 2020-04-19 NOTE — Telephone Encounter (Signed)
LM for Quentin to call for appt with CC.  Needs appt to continue medications.

## 2020-04-27 ENCOUNTER — Ambulatory Visit (INDEPENDENT_AMBULATORY_CARE_PROVIDER_SITE_OTHER): Payer: Medicare Other

## 2020-04-27 VITALS — Ht 62.0 in | Wt 123.0 lb

## 2020-04-27 DIAGNOSIS — Z0000 Encounter for general adult medical examination without abnormal findings: Secondary | ICD-10-CM

## 2020-04-27 NOTE — Progress Notes (Signed)
Subjective:   Monique Ballard is a 80 y.o. female who presents for Medicare Annual (Subsequent) preventive examination.  I connected with [place] today by telephone and verified that I am speaking with the correct person using two identifiers. Location patient: home Location provider: work Persons participating in the virtual visit: patient, Marine scientist.    I discussed the limitations, risks, security and privacy concerns of performing an evaluation and management service by telephone and the availability of in person appointments. I also discussed with the patient that there may be a patient responsible charge related to this service. The patient expressed understanding and verbally consented to this telephonic visit.    Interactive audio and video telecommunications were attempted between this provider and patient, however failed, due to patient having technical difficulties OR patient did not have access to video capability.  We continued and completed visit with audio only.  Some vital signs may be absent or patient reported.   Time Spent with patient on telephone encounter: 20 minutes    Review of Systems     Cardiac Risk Factors include: advanced age (>30men, >36 women);hypertension;sedentary lifestyle     Objective:    Today's Vitals   04/27/20 1405  Weight: 123 lb (55.8 kg)  Height: 5\' 2"  (1.575 m)   Body mass index is 22.5 kg/m.  Advanced Directives 04/27/2020 10/30/2019 10/01/2019 09/30/2019 09/05/2019 04/26/2019 06/10/2018  Does Patient Have a Medical Advance Directive? No No No No No No No  Would patient like information on creating a medical advance directive? Yes (MAU/Ambulatory/Procedural Areas - Information given) - No - Patient declined - No - Patient declined No - Patient declined -    Current Medications (verified) Outpatient Encounter Medications as of 04/27/2020  Medication Sig  . aspirin EC 81 MG tablet Take 1 tablet (81 mg total) by mouth daily. Swallow whole.  .  escitalopram (LEXAPRO) 20 MG tablet TAKE ONE TABLET BY MOUTH ONE TIME DAILY **Schedule appt**  . folic acid (FOLVITE) 1 MG tablet Take 1 tablet (1 mg total) by mouth daily.  Marland Kitchen OLANZapine (ZYPREXA) 7.5 MG tablet TAKE ONE TABLET BY MOUTH DAILY AT BEDTIME  . ondansetron (ZOFRAN) 8 MG tablet Take 1 tablet (8 mg total) by mouth every 8 (eight) hours as needed for nausea or vomiting.  . pantoprazole (PROTONIX) 40 MG tablet Take 1 tablet (40 mg total) by mouth daily.  Marland Kitchen thiamine 100 MG tablet Take 1 tablet (100 mg total) by mouth daily.   No facility-administered encounter medications on file as of 04/27/2020.    Allergies (verified) Penicillins   History: Past Medical History:  Diagnosis Date  . Anxiety   . Breast cancer ([place]) fall 1997    Left Lumpectomy, XRT.  treated with Tamoxifem and Evista  . Depression   . Endometriosis in her 20's   surgery to help clear endometriosis to obtain a pregnancy.  Marland Kitchen GERD (gastroesophageal reflux disease)    w/"stretching" remotely  . HTN (hypertension)   . Migraines    Past Surgical History:  Procedure Laterality Date  . BREAST LUMPECTOMY Left fall 1997   Lymph nodes X 3 were negative, ER+, Radiation treatmetn.  Took Tamoxifem X 5 yrs then Evista for 3-5 yrs.  . CESAREAN SECTION     x2  first pregnancy breach and second normal  . surgery for endometriosis     Family History  Problem Relation Age of Onset  . Hypertension Mother   . Stroke Mother   . Heart attack Father  elderly  . Heart disease Father        CHF  . Throat cancer Other   . Breast cancer Neg Hx   . Colon cancer Neg Hx   . Diabetes Neg Hx   . Neuropathy Neg Hx    Social History   Socioeconomic History  . Marital status: Married    Spouse name: Not on file  . Number of children: 2  . Years of education: Not on file  . Highest education level: Bachelor's degree (e.g., BA, AB, BS)  Occupational History  . Occupation: retired  Tobacco Use  . Smoking status: Former  Smoker    Packs/day: 0.50    Years: 25.00    Pack years: 12.50    Types: Cigarettes    Quit date: 1990    Years since quitting: 32.2  . Smokeless tobacco: Never Used  Vaping Use  . Vaping Use: Never used  Substance and Sexual Activity  . Alcohol use: Yes    Alcohol/week: 7.0 standard drinks    Types: 7 Glasses of wine per week    Comment: 1 glass wine dialy  . Drug use: No  . Sexual activity: Never    Birth control/protection: None  Other Topics Concern  . 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 Difficulty of Paying Living Expenses: Not hard at all  Food Insecurity: No Food Insecurity  . Worried About Charity fundraiser in the Last Year: Never true  . Ran Out of Food in the Last Year: Never true  Transportation Needs: No Transportation Needs  . Lack of Transportation (Medical): No  . Lack of Transportation (Non-Medical): No  Physical Activity: Inactive  . Days of Exercise per Week: 0 days  . Minutes of Exercise per Session: 0 min  Stress: No Stress Concern Present  . Feeling of Stress : Not at all  Social Connections: Socially Integrated  . Frequency of Communication with Friends and Family: More than three times a week  . Frequency of Social Gatherings with Friends and Family: More than three times a week  . Attends Religious Services: More than 4 times per year  . Active Member of Clubs or Organizations: Yes  . Attends Archivist Meetings: 1 to 4 times per year  . Marital Status: Married    Tobacco Counseling Counseling given: Not Answered   Clinical Intake:  Pre-visit preparation completed:  Yes  Pain : No/denies pain     Nutritional Status: BMI of 19-24  Normal Nutritional Risks: None Diabetes: No  How often do you need to have someone help you when you read instructions, pamphlets, or other written materials from your doctor or pharmacy?: 1 - Never  Diabetic?No  Interpreter Needed?: No  Information entered by :: Caroleen Hamman LPN   Activities of Daily Living In your present state of health, do you have any difficulty performing the following activities: 04/27/2020 10/01/2019  Hearing? N N  Vision? N N  Difficulty concentrating or making decisions? N N  Walking or climbing stairs? Aggie Moats  Comment - secondary to weakness  Dressing or bathing? N N  Doing errands, shopping? N Y  Conservation officer, nature and eating ? N -  Using the Toilet? N -  In the past six months, have you accidently leaked urine? Y -  Comment wears depends -  Do you have problems with loss of bowel control? N -  Managing your Medications? N -  Managing your Finances? N -  Housekeeping or managing your Housekeeping? N -  Some recent data might be hidden    Patient Care Team: Colon Branch, MD as PCP - Windle Guard, MD as Consulting Physician (Gastroenterology) Kem Boroughs, FNP as Nurse Practitioner (Family Medicine) Hunsucker, Bonna Gains, MD as Consulting Physician (Pulmonary Disease)  Indicate any recent Medical Services you may have received from other than [facility] providers in the past year (date may be approximate).     Assessment:   This is a routine wellness examination for [place].  Hearing/Vision screen  Hearing Screening   125Hz  250Hz  500Hz  1000Hz  2000Hz  3000Hz  4000Hz  6000Hz  8000Hz   Right ear:           Left ear:           Comments: No issues  Vision Screening Comments: Wears glasses Last eye exam-2021-  Dietary issues and exercise activities discussed: Exercise limited by: Other - see comments (impaired balance)  Goals      Patient Stated   .  Continue PT balance  exercises to prevent more falls. (pt-stated)      Other   .  Patient Stated      Maintain current health      Depression Screen PHQ 2/9 Scores 04/27/2020 03/03/2020 12/01/2019 10/08/2019 04/26/2019 04/23/2018 01/01/2018  PHQ - 2 Score 0 2 - - 1 1 6   PHQ- 9 Score - 9 - - - - 15  Exception Documentation - - Other- indicate reason in comment box Other- indicate reason in comment box - - -  Not completed - - defered to psych deferred psychiatry - - -    Fall Risk Fall Risk  04/27/2020 03/03/2020 04/26/2019 04/23/2018 04/21/2017  Falls in the past year? 1 1 0 1 Yes  Number falls in past yr: 1 1 0 1 2 or more  Injury with Fall? 1 1 0 1 No  Risk Factor Category  - - - - High Fall Risk  Risk for fall due to : History of fall(s);Impaired balance/gait - - Impaired balance/gait -  Follow up Falls prevention discussed - Education provided;Falls prevention discussed - Falls evaluation completed;Education provided    FALL RISK PREVENTION PERTAINING TO THE HOME:  Any stairs in or around the home? Yes  If so, are there any without handrails? No  Home free of loose throw rugs in walkways, pet beds, electrical cords, etc? Yes  Adequate lighting in your home to reduce risk of falls? Yes   ASSISTIVE DEVICES UTILIZED TO PREVENT FALLS:  Life alert? No  Use of a cane, walker or w/c? Yes  Grab bars in the bathroom? Yes  Shower chair or bench in shower? No  Elevated toilet seat or a handicapped toilet? No   TIMED UP AND GO:  Was the test performed? No . Phone visit   Cognitive Function:Normal cognitive status assessed by  this Nurse Health Advisor. No abnormalities found.          Immunizations Immunization History  Administered Date(s) Administered  . Fluad Quad(high Dose 65+) 10/02/2018, 11/03/2019  . Influenza Split 12/08/2013  .  Influenza Whole 11/16/2009  . Influenza, High Dose Seasonal PF 10/14/2014, 12/22/2015, 11/25/2016, 01/01/2018  . PFIZER(Purple Top)SARS-COV-2 Vaccination 03/22/2019,  04/20/2019, 11/19/2019  . Pneumococcal Conjugate-13 08/10/2014  . Pneumococcal Polysaccharide-23 08/28/2009  . Td 02/07/1999, 08/24/2008  . Tdap 10/01/2014, 06/10/2018  . Zoster 08/28/2009    TDAP status: Up to date  Flu Vaccine status: Up to date  Pneumococcal vaccine status: Up to date  Covid-19 vaccine status: Completed vaccines  Qualifies for Shingles Vaccine? Yes   Zostavax completed Yes   Shingrix Completed?: No.    Education has been provided regarding the importance of this vaccine. Patient has been advised to call insurance company to determine out of pocket expense if they have not yet received this vaccine. Advised may also receive vaccine at local pharmacy or Health Dept. Verbalized acceptance and understanding.  Screening Tests Health Maintenance  Topic Date Due  . Hepatitis C Screening  Never done  . MAMMOGRAM  07/06/2020  . INFLUENZA VACCINE  08/21/2020  . TETANUS/TDAP  06/09/2028  . DEXA SCAN  Completed  . COVID-19 Vaccine  Completed  . PNA vac Low Risk Adult  Completed  . HPV VACCINES  Aged Out    Health Maintenance  Health Maintenance Due  Topic Date Due  . Hepatitis C Screening  Never done    Colorectal cancer screening: No longer required.   Mammogram status: Completed Bilateral 07/07/2019. Repeat every year  Bone Density status: Completed 07/07/2019. Results reflect: Bone density results: OSTEOPENIA. Repeat every 2 years.  Lung Cancer Screening: (Low Dose CT Chest recommended if Age 56-80 years, 30 pack-year currently smoking OR have quit w/in 15years.) does not qualify.    Additional Screening:  Hepatitis C Screening: does not qualify  Vision Screening: Recommended annual ophthalmology exams for early detection of glaucoma and other disorders of the eye. Is the patient up to date with their annual eye exam?  Yes  Who is the provider or what is the name of the office in which the patient attends annual eye exams? Pt unsure of name  Dental  Screening: Recommended annual dental exams for proper oral hygiene  Community Resource Referral / Chronic Care Management: CRR required this visit?  No   CCM required this visit?  No      Plan:     I have personally reviewed and noted the following in the patient's chart:   . Medical and social history . Use of alcohol, tobacco or illicit drugs  . Current medications and supplements . Functional ability and status . Nutritional status . Physical activity . Advanced directives . List of other physicians . Hospitalizations, surgeries, and ER visits in previous 12 months . Vitals . Screenings to include cognitive, depression, and falls . Referrals and appointments  In addition, I have reviewed and discussed with patient certain preventive protocols, quality metrics, and best practice recommendations. A written personalized care plan for preventive services as well as general preventive health recommendations were provided to patient.   Due to this being a telephonic visit, the after visit summary with patients personalized plan was offered to patient via mail or my-chart. Patient would like to access on my-chart.   Marta Antu, LPN   06/27/6193  Nurse Health Advisor  Nurse Notes: None

## 2020-04-27 NOTE — Patient Instructions (Signed)
Monique Ballard , Thank you for taking time to complete your Medicare Wellness Visit. I appreciate your ongoing commitment to your health goals. Please review the following plan we discussed and let me know if I can assist you in the future.   Screening recommendations/referrals: Colonoscopy: No longer required Mammogram: 07/07/2019-Due 07/06/2020 Bone Density: Completed 07/07/2019-Due 07/06/2021 Recommended yearly ophthalmology/optometry visit for glaucoma screening and checkup Recommended yearly dental visit for hygiene and checkup  Vaccinations: Influenza vaccine: Up to date Pneumococcal vaccine: Completed vaccines Tdap vaccine: Up to date-Due 06/09/2028 Shingles vaccine: Discuss with pharmacy   Covid-19:Completed vacines  Advanced directives: Information mailed today  Conditions/risks identified: See problem list  Next appointment: Follow up in one year for your annual wellness visit    Preventive Care 65 Years and Older, Female Preventive care refers to lifestyle choices and visits with your health care provider that can promote health and wellness. What does preventive care include?  A yearly physical exam. This is also called an annual well check.  Dental exams once or twice a year.  Routine eye exams. Ask your health care provider how often you should have your eyes checked.  Personal lifestyle choices, including:  Daily care of your teeth and gums.  Regular physical activity.  Eating a healthy diet.  Avoiding tobacco and drug use.  Limiting alcohol use.  Practicing safe sex.  Taking low-dose aspirin every day.  Taking vitamin and mineral supplements as recommended by your health care provider. What happens during an annual well check? The services and screenings done by your health care provider during your annual well check will depend on your age, overall health, lifestyle risk factors, and family history of disease. Counseling  Your health care provider may ask  you questions about your:  Alcohol use.  Tobacco use.  Drug use.  Emotional well-being.  Home and relationship well-being.  Sexual activity.  Eating habits.  History of falls.  Memory and ability to understand (cognition).  Work and work Statistician.  Reproductive health. Screening  You may have the following tests or measurements:  Height, weight, and BMI.  Blood pressure.  Lipid and cholesterol levels. These may be checked every 5 years, or more frequently if you are over 78 years old.  Skin check.  Lung cancer screening. You may have this screening every year starting at age 62 if you have a 30-pack-year history of smoking and currently smoke or have quit within the past 15 years.  Fecal occult blood test (FOBT) of the stool. You may have this test every year starting at age 78.  Flexible sigmoidoscopy or colonoscopy. You may have a sigmoidoscopy every 5 years or a colonoscopy every 10 years starting at age 78.  Hepatitis C blood test.  Hepatitis B blood test.  Sexually transmitted disease (STD) testing.  Diabetes screening. This is done by checking your blood sugar (glucose) after you have not eaten for a while (fasting). You may have this done every 1-3 years.  Bone density scan. This is done to screen for osteoporosis. You may have this done starting at age 78.  Mammogram. This may be done every 1-2 years. Talk to your health care provider about how often you should have regular mammograms. Talk with your health care provider about your test results, treatment options, and if necessary, the need for more tests. Vaccines  Your health care provider may recommend certain vaccines, such as:  Influenza vaccine. This is recommended every year.  Tetanus, diphtheria, and acellular pertussis (Tdap,  Td) vaccine. You may need a Td booster every 10 years.  Zoster vaccine. You may need this after age 78.  Pneumococcal 13-valent conjugate (PCV13) vaccine. One dose  is recommended after age 78.  Pneumococcal polysaccharide (PPSV23) vaccine. One dose is recommended after age 78. Talk to your health care provider about which screenings and vaccines you need and how often you need them. This information is not intended to replace advice given to you by your health care provider. Make sure you discuss any questions you have with your health care provider. Document Released: 02/03/2015 Document Revised: 09/27/2015 Document Reviewed: 11/08/2014 Elsevier Interactive Patient Education  2017 [place] Prevention in the Home Falls can cause injuries. They can happen to people of all ages. There are many things you can do to make your home safe and to help prevent falls. What can I do on the outside of my home?  Regularly fix the edges of walkways and driveways and fix any cracks.  Remove anything that might make you trip as you walk through a door, such as a raised step or threshold.  Trim any bushes or trees on the path to your home.  Use bright outdoor lighting.  Clear any walking paths of anything that might make someone trip, such as rocks or tools.  Regularly check to see if handrails are loose or broken. Make sure that both sides of any steps have handrails.  Any raised decks and porches should have guardrails on the edges.  Have any leaves, snow, or ice cleared regularly.  Use sand or salt on walking paths during winter.  Clean up any spills in your garage right away. This includes oil or grease spills. What can I do in the bathroom?  Use night lights.  Install grab bars by the toilet and in the tub and shower. Do not use towel bars as grab bars.  Use non-skid mats or decals in the tub or shower.  If you need to sit down in the shower, use a plastic, non-slip stool.  Keep the floor dry. Clean up any water that spills on the floor as soon as it happens.  Remove soap buildup in the tub or shower regularly.  Attach bath mats  securely with double-sided non-slip rug tape.  Do not have throw rugs and other things on the floor that can make you trip. What can I do in the bedroom?  Use night lights.  Make sure that you have a light by your bed that is easy to reach.  Do not use any sheets or blankets that are too big for your bed. They should not hang down onto the floor.  Have a firm chair that has side arms. You can use this for support while you get dressed.  Do not have throw rugs and other things on the floor that can make you trip. What can I do in the kitchen?  Clean up any spills right away.  Avoid walking on wet floors.  Keep items that you use a lot in easy-to-reach places.  If you need to reach something above you, use a strong step stool that has a grab bar.  Keep electrical cords out of the way.  Do not use floor polish or wax that makes floors slippery. If you must use wax, use non-skid floor wax.  Do not have throw rugs and other things on the floor that can make you trip. What can I do with my stairs?  Do not  leave any items on the stairs.  Make sure that there are handrails on both sides of the stairs and use them. Fix handrails that are broken or loose. Make sure that handrails are as long as the stairways.  Check any carpeting to make sure that it is firmly attached to the stairs. Fix any carpet that is loose or worn.  Avoid having throw rugs at the top or bottom of the stairs. If you do have throw rugs, attach them to the floor with carpet tape.  Make sure that you have a light switch at the top of the stairs and the bottom of the stairs. If you do not have them, ask someone to add them for you. What else can I do to help prevent falls?  Wear shoes that:  Do not have high heels.  Have rubber bottoms.  Are comfortable and fit you well.  Are closed at the toe. Do not wear sandals.  If you use a stepladder:  Make sure that it is fully opened. Do not climb a closed  stepladder.  Make sure that both sides of the stepladder are locked into place.  Ask someone to hold it for you, if possible.  Clearly mark and make sure that you can see:  Any grab bars or handrails.  First and last steps.  Where the edge of each step is.  Use tools that help you move around (mobility aids) if they are needed. These include:  Canes.  Walkers.  Scooters.  Crutches.  Turn on the lights when you go into a dark area. Replace any light bulbs as soon as they burn out.  Set up your furniture so you have a clear path. Avoid moving your furniture around.  If any of your floors are uneven, fix them.  If there are any pets around you, be aware of where they are.  Review your medicines with your doctor. Some medicines can make you feel dizzy. This can increase your chance of falling. Ask your doctor what other things that you can do to help prevent falls. This information is not intended to replace advice given to you by your health care provider. Make sure you discuss any questions you have with your health care provider. Document Released: 11/03/2008 Document Revised: 06/15/2015 Document Reviewed: 02/11/2014 Elsevier Interactive Patient Education  2017 [employer].

## 2020-06-04 ENCOUNTER — Other Ambulatory Visit: Payer: Self-pay | Admitting: Internal Medicine

## 2020-06-28 ENCOUNTER — Ambulatory Visit: Payer: Medicare Other | Admitting: Neurology

## 2020-07-06 ENCOUNTER — Other Ambulatory Visit: Payer: Self-pay | Admitting: Internal Medicine

## 2020-07-06 ENCOUNTER — Telehealth: Payer: Self-pay | Admitting: *Deleted

## 2020-07-06 NOTE — Telephone Encounter (Signed)
Patient never called GI back to schedule MRI lumbar spine. She has a f/u with Dr Jaynee Eagles on 07/12/20.

## 2020-07-10 ENCOUNTER — Encounter: Payer: Self-pay | Admitting: Neurology

## 2020-07-10 NOTE — Telephone Encounter (Signed)
Called the patient.  There was no answer.  Left a message advising that we saw that there was an upcoming appointment on 6/22.  Instructed on the voicemail that Dr. Lavell Anchors would like to have her imaging completed prior to her appointment and that I did not see where that had been completed we can advised this.  **If the patient returns the call, please ask has she proceeded with getting an MRI completed.  We did not see where it had been done.  If she has not had her imaging, Dr. Jaynee Eagles would recommend that she do that first before following up.  We would cancel the upcoming appointment 6/22 and reschedule for a later timeframe to allow time for her to have the imaging completed.  Please advise the patient to contact [facility] imaging to schedule the MRI.

## 2020-07-11 ENCOUNTER — Other Ambulatory Visit: Payer: Self-pay

## 2020-07-11 ENCOUNTER — Ambulatory Visit
Admission: RE | Admit: 2020-07-11 | Discharge: 2020-07-11 | Disposition: A | Discharge status: Home / Self Care | Payer: Medicare Other | Source: Ambulatory Visit | Attending: Neurology | Admitting: Neurology

## 2020-07-11 DIAGNOSIS — R296 Repeated falls: Secondary | ICD-10-CM | POA: Diagnosis not present

## 2020-07-11 DIAGNOSIS — M48062 Spinal stenosis, lumbar region with neurogenic claudication: Secondary | ICD-10-CM | POA: Diagnosis not present

## 2020-07-11 DIAGNOSIS — M545 Low back pain, unspecified: Secondary | ICD-10-CM | POA: Diagnosis not present

## 2020-07-11 DIAGNOSIS — R29898 Other symptoms and signs involving the musculoskeletal system: Secondary | ICD-10-CM | POA: Diagnosis not present

## 2020-07-11 NOTE — Telephone Encounter (Signed)
Left patient another VM informing her that we needed to reschedule tomorrow's appointment.

## 2020-07-12 ENCOUNTER — Ambulatory Visit (INDEPENDENT_AMBULATORY_CARE_PROVIDER_SITE_OTHER): Payer: Medicare Other | Admitting: Neurology

## 2020-07-12 ENCOUNTER — Ambulatory Visit: Payer: Medicare Other | Admitting: Neurology

## 2020-07-12 ENCOUNTER — Encounter: Payer: Self-pay | Admitting: Neurology

## 2020-07-12 VITALS — BP 138/83 | HR 66 | Ht 63.0 in | Wt 118.0 lb

## 2020-07-12 DIAGNOSIS — R296 Repeated falls: Secondary | ICD-10-CM | POA: Diagnosis not present

## 2020-07-12 DIAGNOSIS — R269 Unspecified abnormalities of gait and mobility: Secondary | ICD-10-CM | POA: Diagnosis not present

## 2020-07-12 NOTE — Progress Notes (Signed)
[facility]    Provider:  Dr Jaynee Eagles Requesting Provider: Colon Branch, MD Primary Care Provider:  Colon Branch, MD  CC:  Neuropathy  Interval history July 12, 2020: Patient was seen initially about 6 months ago for unsteadiness on her feet, she had reported a fall and some low back pain, she denied any sensory changes, no numbness or tingling but just wobbliness in both legs.  She has a history of alcohol dependency, hypertension, depression, tremors, on thiamine and folic acid supplementation.  Suspicion last appointment was for likely deconditioning and alcohol abuse, no significant parkinsonian symptoms but she had been on anti dopaminergic drugs in the past and she did have some decreased arm swing and low stride clearance.  We did order an MRI of the spine to evaluate for any central stenosis.  Her sensory examination was surprisingly very good.  We suggested aspirin every day for small lacunar infarct.  She is feeling "great!". Her gait has improved. She doesn't even think she needs a cane anymore. We discussed her MRI lumbar spine that showed no central canal stenosis. Still no numbness or tingling in the feet. She has lost her balance 3 times. She falls backwards. She does not trip over anything just falls, she may feel weakness in her legs and says she needs to exercise more. No tremor. She declines anymore testing.  HPI December 27, 2019:  Odeal Welden is a 80 y.o. female here as requested by Colon Branch, MD for neuropathy.  Past medical history hypertension, depression, remote breast cancer status post lumpectomy, acute respiratory failure with recent hospitalization which involved ingestion of alcohol possibly, gait disorder, tremor, alcohol dependency, osteopenia.  She is currently on thiamine and folic acid supplementation.  I reviewed Dr. Gilberto Better note, she was admitted to the hospital in September, prior to admission the patient was gradually deteriorating, not getting  out of bed, daughter suspected she was not eating much and was drinking alcohol, at the emergency room she was found to be hypoxic requiring oxygen via nasal cannula, CT angiogram no pulmonary emboli, chest x-ray with no infiltrates, EKG sinus tachycardia, she received Solu-Medrol and bronchodilator therapy, respiratory failure resolved, hypokalemia resolved, troponin slightly elevated.  When she saw Dr. Past she also admitted drinking alcohol prior to admission and staying in the bed most of the time, she still reports unsteadiness needing a cane, denies lower extremity paresthesias, gait disorder unsteady onset since 2017.  MRI of the brain showed microvascular ischemic changes, chronic small vessel infarct of the right caudate, generalized parenchymal volume loss.  After review of epic notes, she has also been to the emergency room for falls for example in October 2021, May 2020, she is also been on antidopaminergic medications such as Abilify in the past for mood disorder, had elevated LFTs presumably alcohol .  Note from 2017 for gait disorder by Dr. Larose Kells appears as though she was referred to physical therapy did not pursue it at the time, also a tremor diagnosed November 2016, gait noted is unsteady without any further details.  Patient is here alone and she reports: She has some "sore toes", she denies any numbness or tingling in her feet and so she is unsure why she was referred for neuropathy. She is very unsteady on her feet. She denies any focal weakness. She is unsteady, she uses the cane to "catch herself", when she wakes up she is very unsteady and it gets better as the day goes on. She  doesn't feel secure. She has been doing exercises from PT (her daughter) for legs and feel like getting stronger, she is not driving anymore, her husband drives (she says she has a Electronics engineer), October was the last time she fell and now she uses a cane, she has some tiredness I the low back, her low back aches, legs feel  fatigued after a while of walking and she has to sit down, just standing alone doesn't make it better. No numbness or tingling, feet do not get cold, she wears socks, no freezing, no stiffness other than the low back, she feels wobbly in both legs, arms are k no problems with the arms lifting them overhead. She does not think voice has become softer. But her handwriting is terrible and smaller. She feels she is walking slower because she is being more careful.    Reviewed notes, labs and imaging from outside physicians, which showed:  Vitamin B1 and folate deficiency 09/2019 05/12/2018: ethanol 46 (nml < 10) tsh 2019 2.27 03/10/2017 01/2016 hgba1c 5.4  MRI brain 11/15/2019: No evidence of recent infarction, hemorrhage, or mass. Chronic microvascular ischemic changes.  CT cervical spine: Disc levels: Loss of intervertebral disc height at C5-6, C6-7 and less so C4-5. Multifactorial right neural foraminal narrowing at C4-5, bilateral neural foraminal narrowing at C5-6 and left greater than right neural foraminal narrowing at C6-7. Mild central canal stenosis at C4-5 and C5-6  B12 587, folate 1.7 B1 < 6   Review of Systems: Patient complains of symptoms per HPI as well as the following symptoms: fall . Pertinent negatives and positives per HPI. All others negative    Social History   Socioeconomic History   Marital status: Married    Spouse name: Not on file   Number of children: 2   Years of education: Not on file   Highest education level: Bachelor's degree (e.g., BA, AB, BS)  Occupational History   Occupation: retired  Tobacco Use   Smoking status: Former    Packs/day: 0.50    Years: 25.00    Pack years: 12.50    Types: Cigarettes    Quit date: 1990    Years since quitting: 32.4   Smokeless tobacco: Never  Vaping Use   Vaping Use: Never used  Substance and Sexual Activity   Alcohol use: Yes    Alcohol/week: 7.0 standard drinks    Types: 7 Glasses of wine per week     Comment: 1 glass wine dialy   Drug use: No   Sexual activity: Never    Birth control/protection: None  Other Topics Concern   Not on file  Social History Narrative   Lives w/ husband and son   1 Older Brother passed away   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   Son lives with her as of 12/2016    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, she is a Community education officer, lives in [place], has 2 children   Caffeine: 1 cup coffee daily      Social Determinants of Radio broadcast assistant Strain: Low Risk    Difficulty of Paying Living Expenses: Not hard at all  Food Insecurity: No Food Insecurity   Worried About Charity fundraiser in the Last Year: Never true   Arboriculturist in the Last Year: Never true  Transportation Needs: No Transportation Needs   Lack of Transportation (Medical): No   Lack of Transportation (Non-Medical): No  Physical Activity: Inactive   Days of  Exercise per Week: 0 days   Minutes of Exercise per Session: 0 min  Stress: No Stress Concern Present   Feeling of Stress : Not at all  Social Connections: Socially Integrated   Frequency of Communication with Friends and Family: More than three times a week   Frequency of Social Gatherings with Friends and Family: More than three times a week   Attends Religious Services: More than 4 times per year   Active Member of [employer] or Organizations: Yes   Attends Archivist Meetings: 1 to 4 times per year   Marital Status: Married  Human resources officer Violence: Not At Risk   Fear of Current or Ex-Partner: No   Emotionally Abused: No   Physically Abused: No   Sexually Abused: No    Family History  Problem Relation Age of Onset   Hypertension Mother    Stroke Mother    Heart attack Father        elderly   Heart disease Father        CHF   Throat cancer Other    Breast cancer Neg Hx    Colon cancer Neg Hx    Diabetes Neg Hx    Neuropathy Neg Hx     Past Medical History:  Diagnosis Date    Anxiety    Breast cancer ([place]) fall 1997    Left Lumpectomy, XRT.  treated with Tamoxifem and Evista   Depression    Endometriosis in her 20's   surgery to help clear endometriosis to obtain a pregnancy.   GERD (gastroesophageal reflux disease)    w/"stretching" remotely   HTN (hypertension)    Migraines     Patient Active Problem List   Diagnosis Date Noted   Hypokalemia 10/01/2019   Acute respiratory failure ([place]) 09/30/2019   Alcohol dependency ([place]) 05/15/2018   Tremor 02/24/2015   Gait disorder 02/24/2015   PCP NOTES >>>>>>> 12/13/2014   Abdominal wall strain 03/19/2012   Osteopenia 10/04/2011   Annual physical exam 09/07/2010   GERD 08/28/2009   Anxiety and depression 10/06/2006   Essential hypertension 10/06/2006   BREAST CANCER, HX OF 10/06/2006    Past Surgical History:  Procedure Laterality Date   BREAST LUMPECTOMY Left fall 1997   Lymph nodes X 3 were negative, ER+, Radiation treatmetn.  Took Tamoxifem X 5 yrs then Evista for 3-5 yrs.   CESAREAN SECTION     x2  first pregnancy breach and second normal   surgery for endometriosis      Current Outpatient Medications  Medication Sig Dispense Refill   aspirin EC 81 MG tablet Take 1 tablet (81 mg total) by mouth daily. Swallow whole. 30 tablet 11   escitalopram (LEXAPRO) 20 MG tablet TAKE ONE TABLET BY MOUTH ONE TIME DAILY **Schedule appt** 90 tablet 0   folic acid (FOLVITE) 1 MG tablet Take 1 tablet (1 mg total) by mouth daily. 30 tablet 0   OLANZapine (ZYPREXA) 7.5 MG tablet TAKE ONE TABLET BY MOUTH DAILY AT BEDTIME 90 tablet 0   ondansetron (ZOFRAN) 8 MG tablet Take 1 tablet (8 mg total) by mouth every 8 (eight) hours as needed for nausea or vomiting. 21 tablet 0   pantoprazole (PROTONIX) 40 MG tablet Take 1 tablet (40 mg total) by mouth daily. 90 tablet 3   thiamine 100 MG tablet Take 1 tablet (100 mg total) by mouth daily. 30 tablet 6   No current facility-administered medications for this visit.     Allergies as  of 07/12/2020 - Review Complete 07/12/2020  Allergen Reaction Noted   Penicillins Rash 02/17/2006    Vitals: BP 138/83   Pulse 66   Ht 5\' 3"  (1.6 m)   Wt 118 lb (53.5 kg)   LMP 01/22/1992 (Within Months)   BMI 20.90 kg/m  Last Weight:  Wt Readings from Last 1 Encounters:  07/12/20 118 lb (53.5 kg)   Last Height:   Ht Readings from Last 1 Encounters:  07/12/20 5\' 3"  (1.6 m)     Neuro: Detailed Neurologic Exam  Speech: stable    Speech is normal; fluent and spontaneous with normal comprehension.  Cognition:    The patient is oriented to person, place, and time;     recent and remote memory intact;     language fluent;     normal attention, concentration,     fund of knowledge Cranial Nerves: slight hypomimia?    The pupils are equal, round, and reactive to light.  Visual fields are full to finger confrontation. Extraocular movements are intact. Trigeminal sensation is intact and the muscles of mastication are normal. The face is symmetric. The palate elevates in the midline. Hearing intact. Voice is normal. Shoulder shrug is normal. The tongue has normal motion without fasciculations.   Coordination:    Normal finger to nose and heel to shin, bradykinesias   Gait:    Low clearance, decreased arm swing  Motor Observation:    No asymmetry, no atrophy, minimal postural tremor Tone:    Normal muscle tone, no cogwheeling  Posture:    Posture is normal. normal erect    Strength: generally 4-4+ throughout more proximally then distally but no focal weakness     Sensation: intact to LT, pinprick and vibration distally in the feet     Reflex Exam:  DTR's:    Deep tendon reflexes in the upper and lower extremities are 2+ bilaterally.   Toes:    The toes are equiv bilaterally.   Clonus:    Clonus is absent.    Assessment/Plan:  80 y.o. female here as requested by Colon Branch, MD for neuropathy.  Past medical history hypertension, depression,  remote breast cancer status post lumpectomy, acute respiratory failure with recent hospitalization which involved ingestion of alcohol possibly, gait disorder with falls, tremor, alcohol dependency, osteopenia.  She is currently on thiamine and folic acid supplementation.   I recommended further testing with patient including emg/ncs, DAT Scan(eval for PD), repeat MRI Brain/Cervical spine. Patient declines, states she feels "great!", she will call us if she feels further eval is needed.  Gait abnormality/ Falls: Likely deconditioning, alcohol abuse, Her gait also shows some decreased arm swing and low stride clearance but no rigidity or concerning tremor. She has been on anti-dopaminergic drugs in the past which can cause this or could be the start of parkinson's disease. MRI L spine showed degenerative/arthritic changes but no central stenosis.   Referred for neuropathy but sensation is actually good: She surprisingly has intact pin prick and vibration in the feet, intact AJ reflexes despite her alcohol abuse and b1 and folic acid deficiency. B12 normal.   Suggest asa 81mg  every day (in the brain we saw a small old lacunar infarct) discussed risk of bleeding and falls  No orders of the defined types were placed in this encounter.  No orders of the defined types were placed in this encounter.   Cc: Colon Branch, MD,  Colon Branch, MD  Sarina Ill, MD  [facility] [address] [place], [place] [address]  Phone [phone] Fax [phone]  I spent 30 minutes of face-to-face and non-face-to-face time with patient on the  1. Gait disorder   2. Falls frequently    diagnosis.  This included previsit chart review, lab review, study review, order entry, electronic health record documentation, patient education on the different diagnostic and therapeutic options, counseling and coordination of care, risks and benefits of management, compliance, or risk factor  reduction

## 2020-07-25 ENCOUNTER — Encounter: Payer: Self-pay | Admitting: Internal Medicine

## 2020-07-25 ENCOUNTER — Ambulatory Visit: Payer: Medicare Other | Attending: Internal Medicine

## 2020-07-25 ENCOUNTER — Ambulatory Visit (INDEPENDENT_AMBULATORY_CARE_PROVIDER_SITE_OTHER): Payer: Medicare Other | Admitting: Internal Medicine

## 2020-07-25 ENCOUNTER — Other Ambulatory Visit: Payer: Self-pay

## 2020-07-25 VITALS — BP 114/68 | HR 84 | Temp 98.1°F | Resp 18 | Ht 63.0 in | Wt 118.0 lb

## 2020-07-25 DIAGNOSIS — Z23 Encounter for immunization: Secondary | ICD-10-CM

## 2020-07-25 DIAGNOSIS — Z1231 Encounter for screening mammogram for malignant neoplasm of breast: Secondary | ICD-10-CM

## 2020-07-25 DIAGNOSIS — M8580 Other specified disorders of bone density and structure, unspecified site: Secondary | ICD-10-CM

## 2020-07-25 DIAGNOSIS — F32A Depression, unspecified: Secondary | ICD-10-CM | POA: Diagnosis not present

## 2020-07-25 DIAGNOSIS — G4734 Idiopathic sleep related nonobstructive alveolar hypoventilation: Secondary | ICD-10-CM | POA: Diagnosis not present

## 2020-07-25 DIAGNOSIS — D649 Anemia, unspecified: Secondary | ICD-10-CM | POA: Diagnosis not present

## 2020-07-25 DIAGNOSIS — R269 Unspecified abnormalities of gait and mobility: Secondary | ICD-10-CM | POA: Diagnosis not present

## 2020-07-25 DIAGNOSIS — I10 Essential (primary) hypertension: Secondary | ICD-10-CM

## 2020-07-25 DIAGNOSIS — F419 Anxiety disorder, unspecified: Secondary | ICD-10-CM

## 2020-07-25 MED ORDER — FOLIC ACID 1 MG PO TABS
1.0000 mg | ORAL_TABLET | Freq: Every day | ORAL | 1 refills | Status: DC
Start: 2020-07-25 — End: 2021-07-27

## 2020-07-25 MED ORDER — PANTOPRAZOLE SODIUM 40 MG PO TBEC
40.0000 mg | DELAYED_RELEASE_TABLET | Freq: Every day | ORAL | 1 refills | Status: DC
Start: 2020-07-25 — End: 2021-07-27

## 2020-07-25 NOTE — Progress Notes (Signed)
   Covid-19 Vaccination Clinic  Name:  Monique Ballard    MRN: [id number] DOB: [birth year]  07/25/2020  [name] was observed post Covid-19 immunization for 15 minutes without incident. She was provided with Vaccine Information Sheet and instruction to access the V-Safe system.   [name] was instructed to call 911 with any severe reactions post vaccine: Difficulty breathing  Swelling of face and throat  A fast heartbeat  A bad rash all over body  Dizziness and weakness   Immunizations Administered     Name Date Dose VIS Date Route   PFIZER Comrnaty(Gray TOP) Covid-19 Vaccine 07/25/2020  2:57 PM 0.3 mL 12/30/2019 Intramuscular   Manufacturer: [place]   Lot: [id number]   [place]: [phone]

## 2020-07-25 NOTE — Progress Notes (Signed)
Subjective:    Patient ID: Monique Ballard, female    DOB: September 18, 1940, 80 y.o.   MRN: [id number]  DOS:  07/25/2020 Type of visit - description: F/U Since the last office visit reports no major concerns. She saw neurology, note reviewed I ask about falls and she did have "at least one fall" since the last visit.  No major injuries. She continue drinking alcohol in moderation.  Wt Readings from Last 3 Encounters:  07/25/20 118 lb (53.5 kg)  07/12/20 118 lb (53.5 kg)  04/27/20 123 lb (55.8 kg)     Review of Systems See above   Past Medical History:  Diagnosis Date   Anxiety    Breast cancer ([place]) fall 1997    Left Lumpectomy, XRT.  treated with Tamoxifem and Evista   Depression    Endometriosis in her 20's   surgery to help clear endometriosis to obtain a pregnancy.   GERD (gastroesophageal reflux disease)    w/"stretching" remotely   HTN (hypertension)    Migraines     Past Surgical History:  Procedure Laterality Date   BREAST LUMPECTOMY Left fall 1997   Lymph nodes X 3 were negative, ER+, Radiation treatmetn.  Took Tamoxifem X 5 yrs then Evista for 3-5 yrs.   CESAREAN SECTION     x2  first pregnancy breach and second normal   surgery for endometriosis      Allergies as of 07/25/2020       Reactions   Penicillins Rash   Has patient had a PCN reaction causing immediate rash, facial/tongue/throat swelling, SOB or lightheadedness with hypotension: Yes Has patient had a PCN reaction causing severe rash involving mucus membranes or skin necrosis: No Has patient had a PCN reaction that required hospitalization No Has patient had a PCN reaction occurring within the last 10 years: No If all of the above answers are "NO", then may proceed with Cephalosporin use.        Medication List        Accurate as of July 25, 2020 11:59 PM. If you have any questions, ask your nurse or doctor.          aspirin EC 81 MG tablet Take 1 tablet (81 mg total) by mouth daily.  Swallow whole.   escitalopram 20 MG tablet Commonly known as: LEXAPRO TAKE ONE TABLET BY MOUTH ONE TIME DAILY **Schedule appt**   folic acid 1 MG tablet Commonly known as: FOLVITE Take 1 tablet (1 mg total) by mouth daily.   OLANZapine 7.5 MG tablet Commonly known as: ZYPREXA TAKE ONE TABLET BY MOUTH DAILY AT BEDTIME   ondansetron 8 MG tablet Commonly known as: ZOFRAN Take 1 tablet (8 mg total) by mouth every 8 (eight) hours as needed for nausea or vomiting.   pantoprazole 40 MG tablet Commonly known as: PROTONIX Take 1 tablet (40 mg total) by mouth daily.   thiamine 100 MG tablet Take 1 tablet (100 mg total) by mouth daily.           Objective:   Physical Exam BP 114/68 (BP Location: Left Arm, Patient Position: Sitting, Cuff Size: Small)   Pulse 84   Temp 98.1 F (36.7 C) (Oral)   Resp 18   Ht 5\' 3"  (1.6 m)   Wt 118 lb (53.5 kg)   LMP 01/22/1992 (Within Months)   SpO2 95%   BMI 20.90 kg/m  General:   Well developed, NAD, BMI noted.  HEENT:  Normocephalic . Face symmetric, atraumatic Lungs:  CTA B Normal respiratory effort, no intercostal retractions, no accessory muscle use. Heart: RRR,  no murmur.  Abdomen:  Not distended, soft, non-tender.  Today I did not appreciate any mass.  No rebound or rigidity.   Skin: Not pale. Not jaundice Lower extremities: no pretibial edema bilaterally  Neurologic:  alert & oriented X3.  Speech normal, gait assisted by a cane, needed help transferring to the table. Psych--  Cognition and judgment appear intact.  Cooperative with normal attention span and concentration.  Behavior appropriate. No anxious or depressed appearing.     Assessment      Assessment HTN Depression, Anxiety - Dr Clovis Pu  Tremor dx [phone] Gait d/o: unsteady onset ~ 2017 GERD with esophageal stricture L Breast cancer, 1997, lumpectomy, XRT, released from oncology ETOH Stroke per MRI brain (2021), rx ASA per neuro Fatty liver per [place]   03/07/2020 Hypoxemia: Found on admission 09/30/2019.  On nocturnal oxygen as of 07-2020.  Saw pulmonary, did not pursue work-up   PLAN: HTN: BP today is very good.  On no medications Depression anxiety: Under very good control with olanzapine Lexapro, does not have a follow-up schedule w/ Dr. Clovis Pu Neuropathy, last seen by neurology on 07/12/2020, at the time she reported she was feeling great and declined a EMG, NCS, DAT scans, MRIs. Gait disorder: Saw neurology, likely combination of deconditioning, etoh .  MRI of the lumbar spine was eventually done and showed no central stenosis; uses a cane, encouraged to continue to do so, walker?  Declined. GERD with esophageal stricture: RF PPIs Osteopenia: T score -2.22 June 2019, she has a small frame, at high risk for falls.  History of dysphagia, offered treatment with Prolia, she agreed.  We will check a BMP Hypoxemia: Currently using oxygen at night only, today O2 sat is 95% on room air.  Plan: Continue nocturnal O2, at some point consider ONO. EtOH: Continue with 2 servings at night.  RF folic acid. Epigastric abdominal mass?: See last visit, US showed fatty liver but no other abnormalities, clinical exam today is benign, no mass thus rec observation for now. Anemia: Per chart review, checking labs  Preventive care:  Rec COVID-vaccine #4, consider Shingrix. Has a healthcare POA, recommend to bring a copy RTC 3 months next   This visit occurred during the SARS-CoV-2 public health emergency.  Safety protocols were in place, including screening questions prior to the visit, additional usage of staff PPE, and extensive cleaning of exam room while observing appropriate contact time as indicated for disinfecting solutions.

## 2020-07-25 NOTE — Patient Instructions (Addendum)
Get your COVID-vaccine today.  Consider getting Shingrix vaccine  We have placed a order for your mammogram. Please expect a call from [facility] to schedule an appointment.   Please bring a copy of your healthcare power of attorney     GO TO THE LAB : Get the blood work     [place], [place] back for   a checkup in 3 months

## 2020-07-26 ENCOUNTER — Telehealth: Payer: Self-pay

## 2020-07-26 DIAGNOSIS — G4734 Idiopathic sleep related nonobstructive alveolar hypoventilation: Secondary | ICD-10-CM | POA: Insufficient documentation

## 2020-07-26 LAB — CBC WITH DIFFERENTIAL/PLATELET
Basophils Absolute: 0.1 10*3/uL (ref 0.0–0.1)
Basophils Relative: 1 % (ref 0.0–3.0)
Eosinophils Absolute: 0.1 10*3/uL (ref 0.0–0.7)
Eosinophils Relative: 1.1 % (ref 0.0–5.0)
HCT: 40.1 % (ref 36.0–46.0)
Hemoglobin: 13.4 g/dL (ref 12.0–15.0)
Lymphocytes Relative: 26.7 % (ref 12.0–46.0)
Lymphs Abs: 2 10*3/uL (ref 0.7–4.0)
MCHC: 33.5 g/dL (ref 30.0–36.0)
MCV: 97.3 fl (ref 78.0–100.0)
Monocytes Absolute: 1.4 10*3/uL — ABNORMAL HIGH (ref 0.1–1.0)
Monocytes Relative: 19.1 % — ABNORMAL HIGH (ref 3.0–12.0)
Neutro Abs: 3.9 10*3/uL (ref 1.4–7.7)
Neutrophils Relative %: 52.1 % (ref 43.0–77.0)
Platelets: 322 10*3/uL (ref 150.0–400.0)
RBC: 4.12 Mil/uL (ref 3.87–5.11)
RDW: 13.5 % (ref 11.5–15.5)
WBC: 7.5 10*3/uL (ref 4.0–10.5)

## 2020-07-26 LAB — BASIC METABOLIC PANEL
BUN: 6 mg/dL (ref 6–23)
CO2: 27 mEq/L (ref 19–32)
Calcium: 9.7 mg/dL (ref 8.4–10.5)
Chloride: 105 mEq/L (ref 96–112)
Creatinine, Ser: 0.62 mg/dL (ref 0.40–1.20)
GFR: 85.82 mL/min (ref >60.00)
Glucose, Bld: 93 mg/dL (ref 70–99)
Potassium: 4.3 mEq/L (ref 3.5–5.1)
Sodium: 141 mEq/L (ref 135–145)

## 2020-07-26 LAB — FERRITIN: Ferritin: 253.7 ng/mL (ref 10.0–291.0)

## 2020-07-26 LAB — IRON: Iron: 88 ug/dL (ref 42–145)

## 2020-07-26 NOTE — Assessment & Plan Note (Signed)
HTN: BP today is very good.  On no medications Depression anxiety: Under very good control with olanzapine Lexapro, does not have a follow-up schedule w/ Dr. Clovis Pu Neuropathy, last seen by neurology on 07/12/2020, at the time she reported she was feeling great and declined a EMG, NCS, DAT scans, MRIs. Gait disorder: Saw neurology, likely combination of deconditioning, etoh .  MRI of the lumbar spine was eventually done and showed no central stenosis; uses a cane, encouraged to continue to do so, walker?  Declined. GERD with esophageal stricture: RF PPIs Osteopenia: T score -2.22 June 2019, she has a small frame, at high risk for falls.  History of dysphagia, offered treatment with Prolia, she agreed.  We will check a BMP Hypoxemia: Currently using oxygen at night only, today O2 sat is 95% on room air.  Plan: Continue nocturnal O2, at some point consider ONO. EtOH: Continue with 2 servings at night.  RF folic acid. Epigastric abdominal mass?: See last visit, US showed fatty liver but no other abnormalities, clinical exam today is benign, no mass thus rec observation for now. Anemia: Per chart review, checking labs  Preventive care:  Rec COVID-vaccine #4, consider Shingrix. Has a healthcare POA, recommend to bring a copy RTC 3 months next

## 2020-07-26 NOTE — Telephone Encounter (Signed)
Initiated Prolia within portal. Submitted clinica info. Waiting on summary of benefits from insurance. Will discuss once determined coverage.

## 2020-07-28 ENCOUNTER — Other Ambulatory Visit (HOSPITAL_BASED_OUTPATIENT_CLINIC_OR_DEPARTMENT_OTHER): Payer: Self-pay

## 2020-07-28 DIAGNOSIS — Z23 Encounter for immunization: Secondary | ICD-10-CM | POA: Diagnosis not present

## 2020-07-28 MED ORDER — COVID-19 MRNA VAC-TRIS(PFIZER) 30 MCG/0.3ML IM SUSP
INTRAMUSCULAR | 0 refills | Status: DC
  Filled 2020-07-28: qty 0.3, 1d supply, fill #0

## 2020-08-27 ENCOUNTER — Other Ambulatory Visit: Payer: Self-pay | Admitting: Psychiatry

## 2020-08-27 DIAGNOSIS — F331 Major depressive disorder, recurrent, moderate: Secondary | ICD-10-CM

## 2020-08-27 DIAGNOSIS — F4001 Agoraphobia with panic disorder: Secondary | ICD-10-CM

## 2020-08-27 DIAGNOSIS — F411 Generalized anxiety disorder: Secondary | ICD-10-CM

## 2020-08-30 NOTE — Telephone Encounter (Signed)
Left message for pt to schedule appt

## 2020-08-30 NOTE — Telephone Encounter (Signed)
Please schedule appt

## 2020-09-16 ENCOUNTER — Ambulatory Visit
Admission: RE | Admit: 2020-09-16 | Discharge: 2020-09-16 | Disposition: A | Discharge status: Home / Self Care | Payer: Medicare Other | Source: Ambulatory Visit | Attending: Internal Medicine | Admitting: Internal Medicine

## 2020-09-16 DIAGNOSIS — Z1231 Encounter for screening mammogram for malignant neoplasm of breast: Secondary | ICD-10-CM | POA: Diagnosis not present

## 2020-09-16 IMAGING — MG MM DIGITAL SCREENING BILAT W/ TOMO AND CAD
6 of 10 series · 6 of 30 positions shown · non-contrast
Comparison: Previous exam(s).

CLINICAL DATA: Screening.

EXAM:
DIGITAL SCREENING BILATERAL MAMMOGRAM WITH TOMOSYNTHESIS AND CAD
TECHNIQUE: Bilateral screening digital craniocaudal and mediolateral oblique
mammograms were obtained. Bilateral screening digital breast
tomosynthesis was performed. The images were evaluated with
computer-aided detection.

[R CC synth-2D]
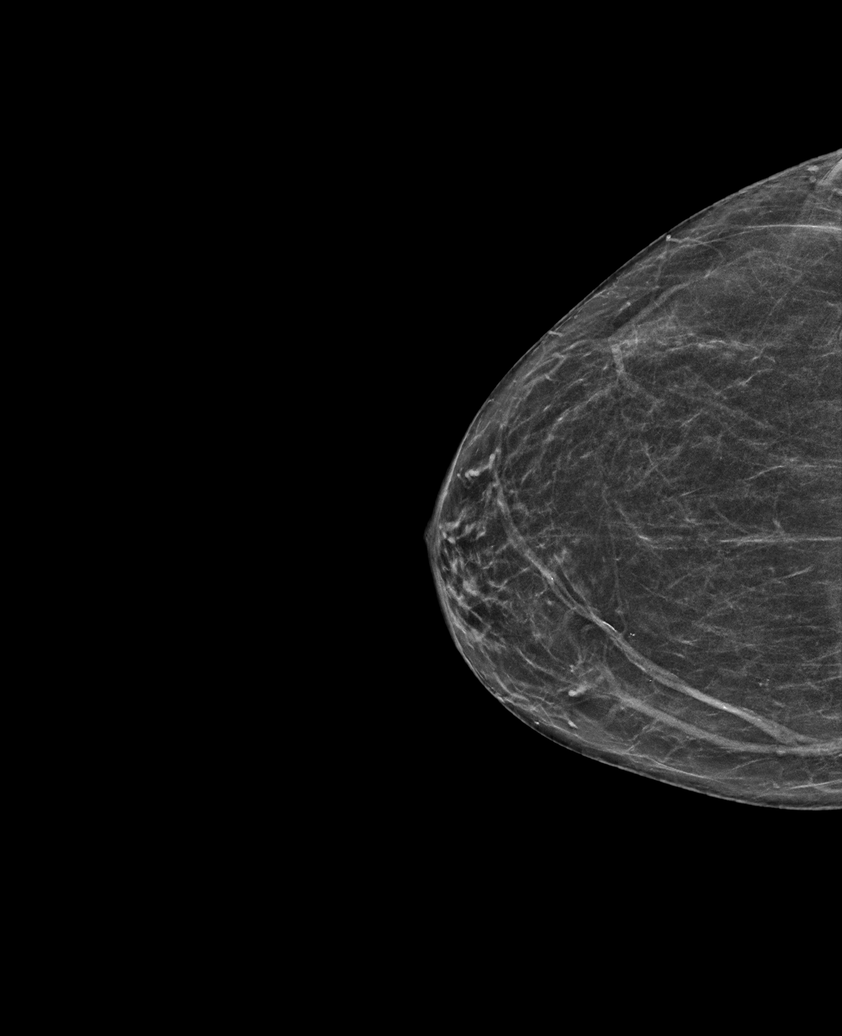

[R MLO synth-2D]
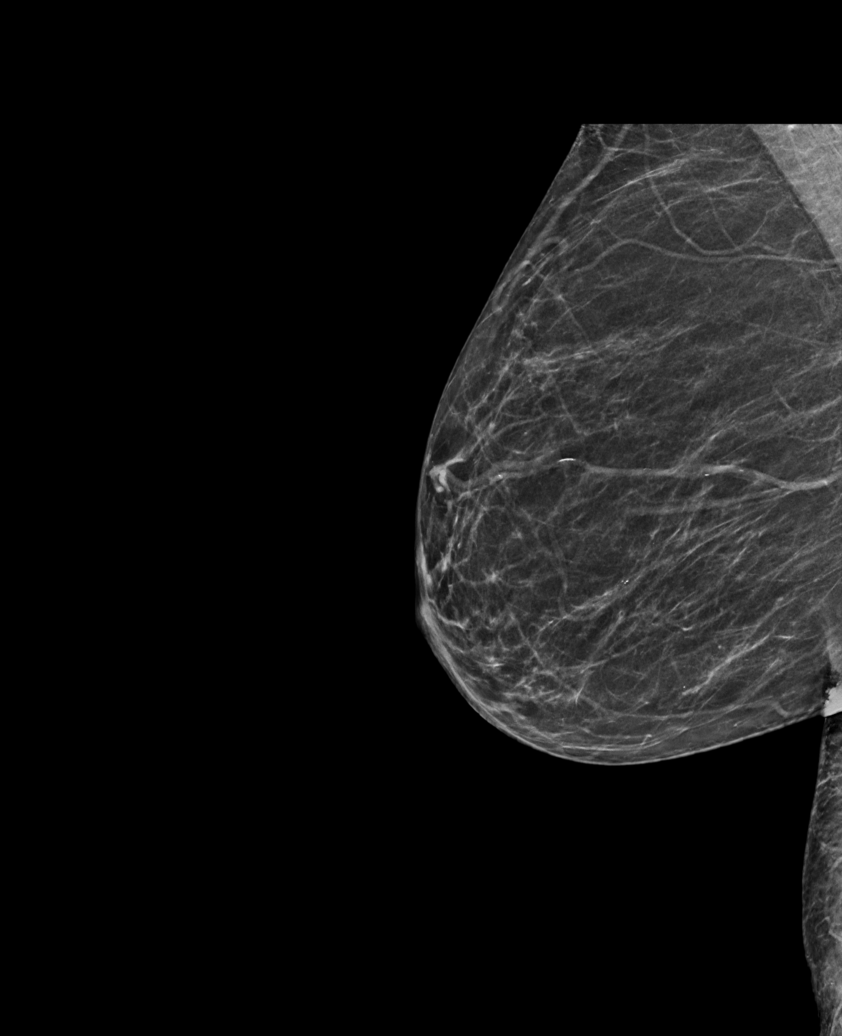

[L MLO synth-2D]
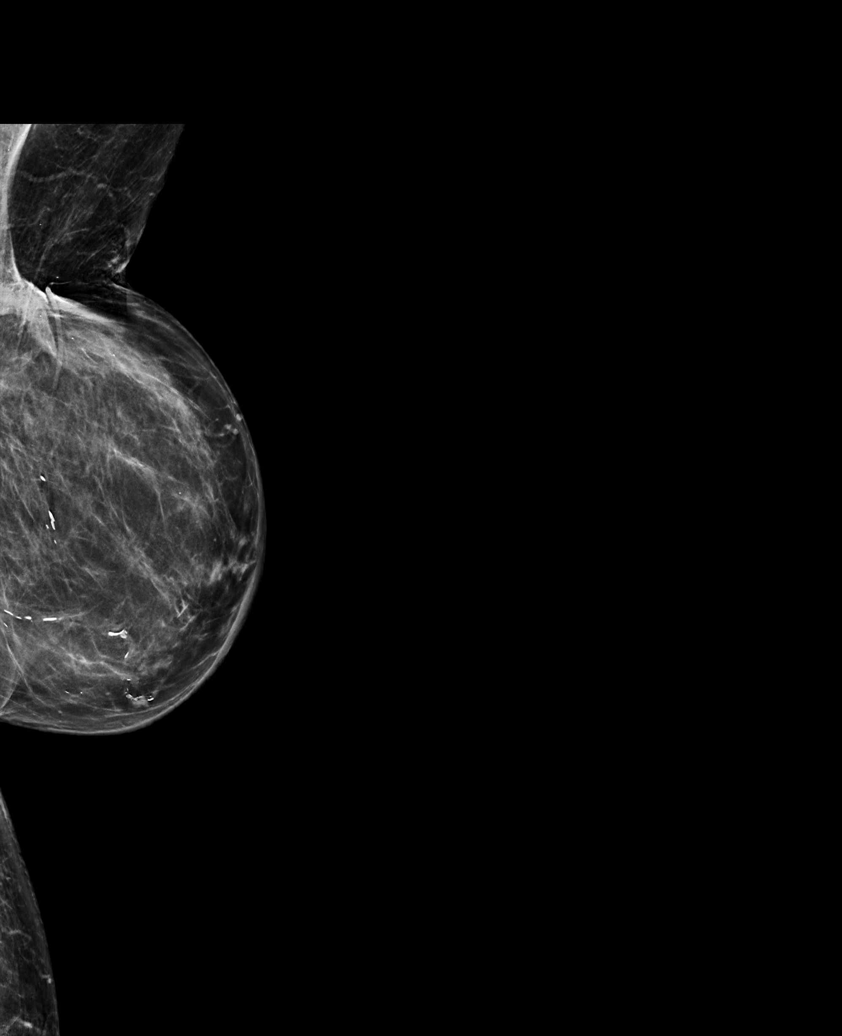

[L CC synth-2D]
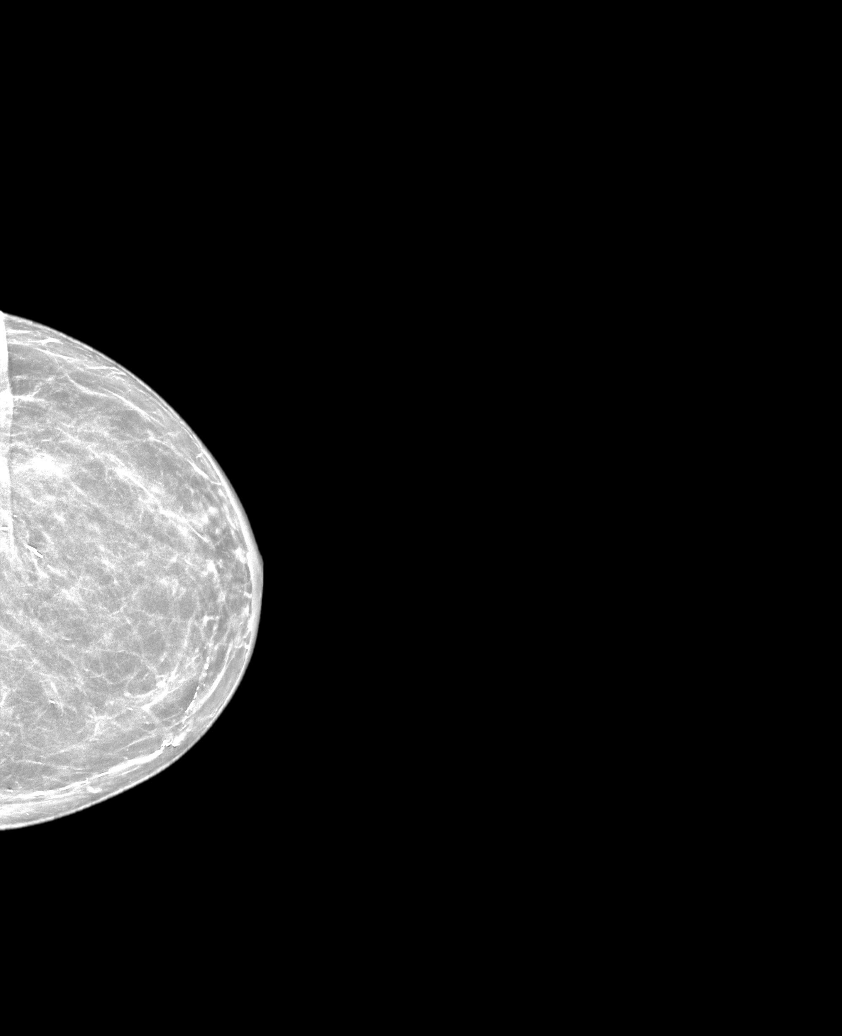

[L XCCL synth-2D]
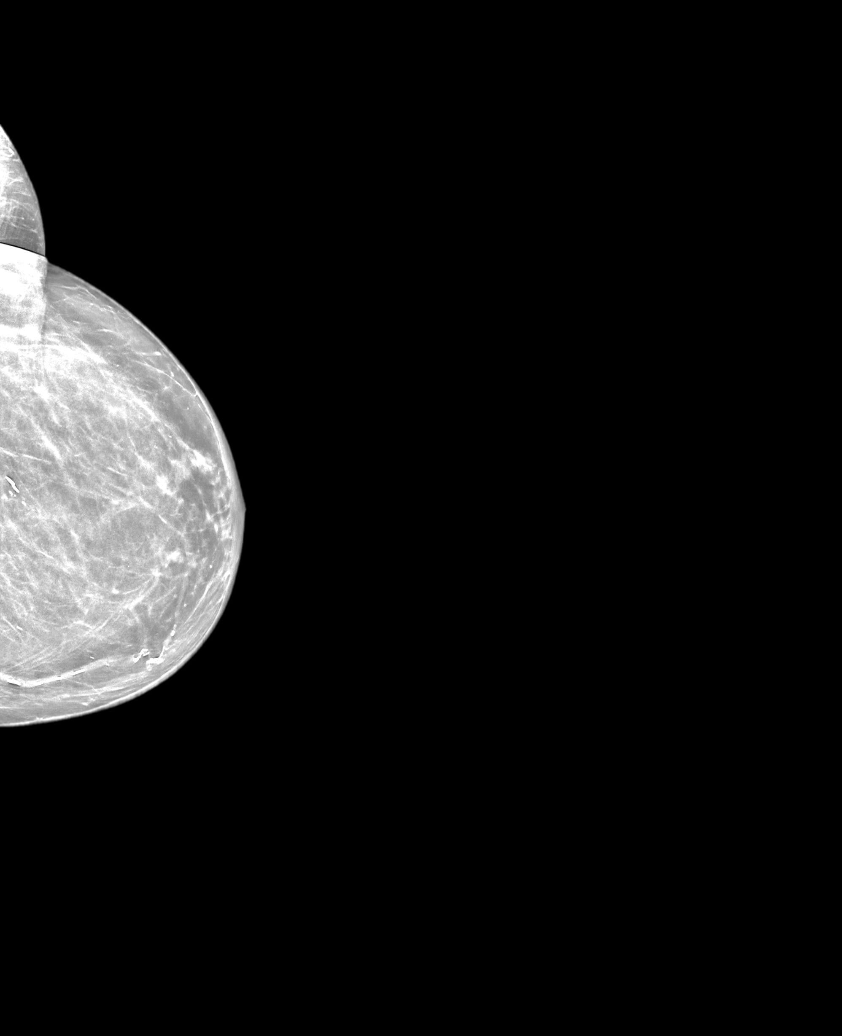

[R CC tomo · tomo slice 29/57.0]
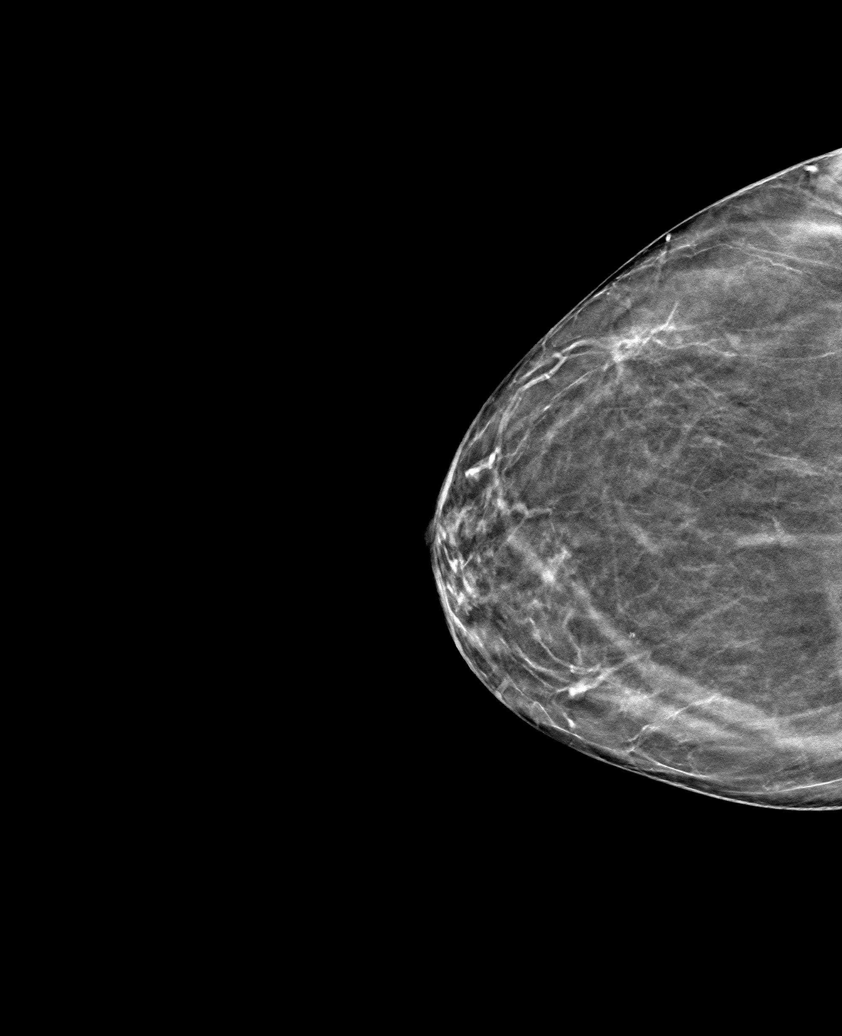

[6 of 30 positions shown; findings below may reference images not displayed]

ACR Breast Density Category b: There are scattered areas of
fibroglandular density.
FINDINGS: There are no findings suspicious for malignancy.
IMPRESSION: No mammographic evidence of malignancy. A result letter of this
screening mammogram will be mailed directly to the patient.

RECOMMENDATION:
Screening mammogram in one year. (Code:[BY])

BI-RADS CATEGORY  1: Negative.

## 2020-09-26 ENCOUNTER — Other Ambulatory Visit (HOSPITAL_BASED_OUTPATIENT_CLINIC_OR_DEPARTMENT_OTHER): Payer: Self-pay

## 2020-10-15 ENCOUNTER — Other Ambulatory Visit: Payer: Self-pay | Admitting: Psychiatry

## 2020-10-15 DIAGNOSIS — F411 Generalized anxiety disorder: Secondary | ICD-10-CM

## 2020-10-15 DIAGNOSIS — F331 Major depressive disorder, recurrent, moderate: Secondary | ICD-10-CM

## 2020-10-15 DIAGNOSIS — F4001 Agoraphobia with panic disorder: Secondary | ICD-10-CM

## 2020-10-25 ENCOUNTER — Encounter: Payer: Self-pay | Admitting: Internal Medicine

## 2020-10-25 ENCOUNTER — Other Ambulatory Visit: Payer: Self-pay

## 2020-10-25 ENCOUNTER — Ambulatory Visit (INDEPENDENT_AMBULATORY_CARE_PROVIDER_SITE_OTHER): Payer: Medicare Other | Admitting: Internal Medicine

## 2020-10-25 VITALS — BP 118/84 | HR 101 | Temp 98.8°F | Resp 16 | Ht 63.0 in | Wt 133.5 lb

## 2020-10-25 DIAGNOSIS — F32A Depression, unspecified: Secondary | ICD-10-CM

## 2020-10-25 DIAGNOSIS — R296 Repeated falls: Secondary | ICD-10-CM

## 2020-10-25 DIAGNOSIS — F419 Anxiety disorder, unspecified: Secondary | ICD-10-CM | POA: Diagnosis not present

## 2020-10-25 DIAGNOSIS — I10 Essential (primary) hypertension: Secondary | ICD-10-CM

## 2020-10-25 DIAGNOSIS — Z23 Encounter for immunization: Secondary | ICD-10-CM

## 2020-10-25 DIAGNOSIS — R269 Unspecified abnormalities of gait and mobility: Secondary | ICD-10-CM

## 2020-10-25 DIAGNOSIS — F1020 Alcohol dependence, uncomplicated: Secondary | ICD-10-CM

## 2020-10-25 DIAGNOSIS — R2681 Unsteadiness on feet: Secondary | ICD-10-CM

## 2020-10-25 NOTE — Telephone Encounter (Signed)
Can you chck status of this please?

## 2020-10-25 NOTE — Progress Notes (Signed)
Subjective:    Patient ID: Monique Ballard, female    DOB: [birth year], 80 y.o.   MRN: [id number]  DOS:  10/25/2020 Type of visit - description: Routine checkup Since the last office visit is doing well. Has really no concerns. When asked, admits that she continue drinking EtOH. She had 2 falls without major injury   Review of Systems Heart rate is about 100, she denies chest pain, palpitations.  Past Medical History:  Diagnosis Date   Anxiety    Breast cancer ([place]) fall 1997    Left Lumpectomy, XRT.  treated with Tamoxifem and Evista   Depression    Endometriosis in her 20's   surgery to help clear endometriosis to obtain a pregnancy.   GERD (gastroesophageal reflux disease)    w/"stretching" remotely   HTN (hypertension)    Migraines     Past Surgical History:  Procedure Laterality Date   BREAST LUMPECTOMY Left fall 1997   Lymph nodes X 3 were negative, ER+, Radiation treatmetn.  Took Tamoxifem X 5 yrs then Evista for 3-5 yrs.   CESAREAN SECTION     x2  first pregnancy breach and second normal   surgery for endometriosis      Allergies as of 10/25/2020       Reactions   Penicillins Rash   Has patient had a PCN reaction causing immediate rash, facial/tongue/throat swelling, SOB or lightheadedness with hypotension: Yes Has patient had a PCN reaction causing severe rash involving mucus membranes or skin necrosis: No Has patient had a PCN reaction that required hospitalization No Has patient had a PCN reaction occurring within the last 10 years: No If all of the above answers are "NO", then may proceed with Cephalosporin use.        Medication List        Accurate as of October 25, 2020 11:59 PM. If you have any questions, ask your nurse or doctor.          STOP taking these medications    Pfizer-BioNT COVID-19 Vac-TriS Susp injection Generic drug: COVID-19 mRNA Vac-TriS Therapist, music) Stopped by: Kathlene November, MD       TAKE these medications    aspirin EC  81 MG tablet Take 1 tablet (81 mg total) by mouth daily. Swallow whole.   escitalopram 20 MG tablet Commonly known as: LEXAPRO TAKE ONE TABLET BY MOUTH ONE TIME DAILY **Schedule appt**   folic acid 1 MG tablet Commonly known as: FOLVITE Take 1 tablet (1 mg total) by mouth daily.   OLANZapine 7.5 MG tablet Commonly known as: ZYPREXA TAKE ONE TABLET BY MOUTH DAILY AT BEDTIME   ondansetron 8 MG tablet Commonly known as: ZOFRAN Take 1 tablet (8 mg total) by mouth every 8 (eight) hours as needed for nausea or vomiting.   pantoprazole 40 MG tablet Commonly known as: PROTONIX Take 1 tablet (40 mg total) by mouth daily.   thiamine 100 MG tablet Take 1 tablet (100 mg total) by mouth daily.           Objective:   Physical Exam BP 118/84 (BP Location: Left Arm, Patient Position: Sitting, Cuff Size: Small)   Pulse (!) 101   Temp 98.8 F (37.1 C) (Oral)   Resp 16   Ht 5\' 3"  (1.6 m)   Wt 133 lb 8 oz (60.6 kg)   LMP 01/22/1992 (Within Months)   SpO2 97%   BMI 23.65 kg/m  General:   Well developed, NAD, BMI noted. HEENT:  Normocephalic .  Face symmetric, atraumatic Lungs:  CTA B Normal respiratory effort, no intercostal retractions, no accessory muscle use. Heart: Tachycardic, regular,  no murmur.  Lower extremities: no pretibial edema bilaterally  Skin: Not pale. Not jaundice Neurologic:  alert & oriented X3.  Speech normal, gait appropriate for age, assisted by a cane psych--  Cognition and judgment appear intact.  Cooperative with normal attention span and concentration.  Behavior appropriate. No anxious or depressed appearing.      Assessment    Assessment HTN Depression, Anxiety - Dr Clovis Pu  Tremor dx [phone] Gait d/o: unsteady onset ~ 2017 GERD with esophageal stricture L Breast cancer, 1997, lumpectomy, XRT, released from oncology ETOH Stroke per MRI brain (2021), rx ASA per neuro Fatty liver per [place]  03/07/2020 Hypoxemia: Found on admission 09/30/2019.   On nocturnal oxygen as of 07-2020.  Saw pulmonary, did not pursue work-up   PLAN: HTN: On no meds, BP is very good. Depression, anxiety: Reports she is doing well emotionally on Lexapro and Zyprexa, recommend to ask for refills when needed (I will RF  if needed) Gait disorder: Had 2 additional falls, risk of fractures discussed, recommend PT, she reluctantly agreed. EtOH: Still drinks, "3 glasses of wine at night".  Strongly recommend to quit or at least moderate to 1 glass at night.  AA?  She is not motivated to quit. Tachycardia: Heart rate is slightly elevated today, no symptoms, heart rate is regular.  Observe. Anemia: Resolved, recent labs normal. Osteopenia: See last visit, has not gotten Prolia yet. Preventive care: Flu shot today, had 4 COVID vaccines.  She provided a healthcare power of attorney today RTC 4 months    This visit occurred during the SARS-CoV-2 public health emergency.  Safety protocols were in place, including screening questions prior to the visit, additional usage of staff PPE, and extensive cleaning of exam room while observing appropriate contact time as indicated for disinfecting solutions.

## 2020-10-25 NOTE — Patient Instructions (Signed)
Check the  blood pressure 2  times a month   BP GOAL is between 110/65 and  135/85. If it is consistently higher or lower, let me know    GO TO THE FRONT DESK, PLEASE SCHEDULE YOUR APPOINTMENTS Come back for   4 months

## 2020-10-27 NOTE — Assessment & Plan Note (Signed)
HTN: On no meds, BP is very good. Depression, anxiety: Reports she is doing well emotionally on Lexapro and Zyprexa, recommend to ask for refills when needed (I will RF  if needed) Gait disorder: Had 2 additional falls, risk of fractures discussed, recommend PT, she reluctantly agreed. EtOH: Still drinks, "3 glasses of wine at night".  Strongly recommend to quit or at least moderate to 1 glass at night.  AA?  She is not motivated to quit. Tachycardia: Heart rate is slightly elevated today, no symptoms, heart rate is regular.  Observe. Anemia: Resolved, recent labs normal. Osteopenia: See last visit, has not gotten Prolia yet. Preventive care: Flu shot today, had 4 COVID vaccines.  She provided a healthcare power of attorney today RTC 4 months

## 2020-11-09 ENCOUNTER — Other Ambulatory Visit: Payer: Self-pay | Admitting: Psychiatry

## 2020-11-09 DIAGNOSIS — F411 Generalized anxiety disorder: Secondary | ICD-10-CM

## 2020-11-09 DIAGNOSIS — F331 Major depressive disorder, recurrent, moderate: Secondary | ICD-10-CM

## 2020-11-09 DIAGNOSIS — F4001 Agoraphobia with panic disorder: Secondary | ICD-10-CM

## 2020-11-10 NOTE — Telephone Encounter (Signed)
Please schedule appt

## 2020-11-10 NOTE — Telephone Encounter (Signed)
Dr. Clovis Pu,    This patient has NOT been seen in office since Jan 2021.  She called for a refill.  Beth told me to check with you to see if you can/will see her or if she needs to see Aaron Edelman.

## 2020-11-13 NOTE — Telephone Encounter (Signed)
Please review

## 2020-11-17 ENCOUNTER — Ambulatory Visit: Payer: Medicare Other | Attending: Internal Medicine

## 2020-11-17 DIAGNOSIS — Z23 Encounter for immunization: Secondary | ICD-10-CM

## 2020-11-17 NOTE — Progress Notes (Signed)
   Covid-19 Vaccination Clinic  Name:  Monique Ballard    MRN: [id number] DOB: Nov 14, 1940  11/17/2020  [name] was observed post Covid-19 immunization for 15 minutes without incident. She was provided with Vaccine Information Sheet and instruction to access the V-Safe system.   [name] was instructed to call 911 with any severe reactions post vaccine: Difficulty breathing  Swelling of face and throat  A fast heartbeat  A bad rash all over body  Dizziness and weakness   Immunizations Administered     Name Date Dose VIS Date Route   Pfizer Covid-19 Vaccine Bivalent Booster 11/17/2020  2:15 PM 0.3 mL 09/20/2020 Intramuscular   Manufacturer: [place]   Lot: [id number]   [place]: [phone]

## 2020-11-28 DIAGNOSIS — R269 Unspecified abnormalities of gait and mobility: Secondary | ICD-10-CM | POA: Diagnosis not present

## 2020-11-28 DIAGNOSIS — R296 Repeated falls: Secondary | ICD-10-CM | POA: Diagnosis not present

## 2020-11-30 ENCOUNTER — Other Ambulatory Visit: Payer: Self-pay | Admitting: Psychiatry

## 2020-11-30 DIAGNOSIS — F411 Generalized anxiety disorder: Secondary | ICD-10-CM

## 2020-11-30 DIAGNOSIS — F331 Major depressive disorder, recurrent, moderate: Secondary | ICD-10-CM

## 2020-11-30 DIAGNOSIS — F5105 Insomnia due to other mental disorder: Secondary | ICD-10-CM

## 2020-12-01 NOTE — Telephone Encounter (Signed)
Last seen 11/23/19

## 2020-12-01 NOTE — Telephone Encounter (Signed)
Please schedule appt

## 2020-12-05 DIAGNOSIS — R296 Repeated falls: Secondary | ICD-10-CM | POA: Diagnosis not present

## 2020-12-05 DIAGNOSIS — R269 Unspecified abnormalities of gait and mobility: Secondary | ICD-10-CM | POA: Diagnosis not present

## 2020-12-07 ENCOUNTER — Telehealth: Payer: Self-pay

## 2020-12-07 NOTE — Telephone Encounter (Signed)
Plan of care signed and faxed back to [facility] at [phone]. Form sent for scanning.

## 2020-12-11 ENCOUNTER — Other Ambulatory Visit (HOSPITAL_BASED_OUTPATIENT_CLINIC_OR_DEPARTMENT_OTHER): Payer: Self-pay

## 2020-12-11 DIAGNOSIS — Z23 Encounter for immunization: Secondary | ICD-10-CM | POA: Diagnosis not present

## 2020-12-11 MED ORDER — PFIZER COVID-19 VAC BIVALENT 30 MCG/0.3ML IM SUSP
INTRAMUSCULAR | 0 refills | Status: DC
  Filled 2020-12-11: qty 0.3, 1d supply, fill #0

## 2020-12-19 DIAGNOSIS — R269 Unspecified abnormalities of gait and mobility: Secondary | ICD-10-CM | POA: Diagnosis not present

## 2020-12-19 DIAGNOSIS — Z961 Presence of intraocular lens: Secondary | ICD-10-CM | POA: Diagnosis not present

## 2020-12-19 DIAGNOSIS — R296 Repeated falls: Secondary | ICD-10-CM | POA: Diagnosis not present

## 2020-12-19 DIAGNOSIS — H524 Presbyopia: Secondary | ICD-10-CM | POA: Diagnosis not present

## 2020-12-19 DIAGNOSIS — H52223 Regular astigmatism, bilateral: Secondary | ICD-10-CM | POA: Diagnosis not present

## 2020-12-19 DIAGNOSIS — H5203 Hypermetropia, bilateral: Secondary | ICD-10-CM | POA: Diagnosis not present

## 2020-12-19 DIAGNOSIS — H35372 Puckering of macula, left eye: Secondary | ICD-10-CM | POA: Diagnosis not present

## 2020-12-26 DIAGNOSIS — R269 Unspecified abnormalities of gait and mobility: Secondary | ICD-10-CM | POA: Diagnosis not present

## 2020-12-26 DIAGNOSIS — R296 Repeated falls: Secondary | ICD-10-CM | POA: Diagnosis not present

## 2021-01-02 DIAGNOSIS — R296 Repeated falls: Secondary | ICD-10-CM | POA: Diagnosis not present

## 2021-01-02 DIAGNOSIS — R269 Unspecified abnormalities of gait and mobility: Secondary | ICD-10-CM | POA: Diagnosis not present

## 2021-01-08 ENCOUNTER — Telehealth: Payer: Self-pay

## 2021-01-08 NOTE — Telephone Encounter (Signed)
PT orders signed and faxed back to [facility] at [phone]. Form sent for scanning.

## 2021-01-09 DIAGNOSIS — R296 Repeated falls: Secondary | ICD-10-CM | POA: Diagnosis not present

## 2021-01-09 DIAGNOSIS — R269 Unspecified abnormalities of gait and mobility: Secondary | ICD-10-CM | POA: Diagnosis not present

## 2021-01-16 ENCOUNTER — Other Ambulatory Visit: Payer: Self-pay | Admitting: Internal Medicine

## 2021-01-16 DIAGNOSIS — R296 Repeated falls: Secondary | ICD-10-CM | POA: Diagnosis not present

## 2021-01-16 DIAGNOSIS — R269 Unspecified abnormalities of gait and mobility: Secondary | ICD-10-CM | POA: Diagnosis not present

## 2021-02-26 ENCOUNTER — Telehealth (INDEPENDENT_AMBULATORY_CARE_PROVIDER_SITE_OTHER): Payer: Medicare Other | Admitting: Internal Medicine

## 2021-02-26 ENCOUNTER — Encounter: Payer: Self-pay | Admitting: Internal Medicine

## 2021-02-26 VITALS — Ht 63.0 in | Wt 110.0 lb

## 2021-02-26 DIAGNOSIS — F32A Depression, unspecified: Secondary | ICD-10-CM | POA: Diagnosis not present

## 2021-02-26 DIAGNOSIS — F1020 Alcohol dependence, uncomplicated: Secondary | ICD-10-CM | POA: Diagnosis not present

## 2021-02-26 DIAGNOSIS — F419 Anxiety disorder, unspecified: Secondary | ICD-10-CM | POA: Diagnosis not present

## 2021-02-26 DIAGNOSIS — R269 Unspecified abnormalities of gait and mobility: Secondary | ICD-10-CM | POA: Diagnosis not present

## 2021-02-26 NOTE — Progress Notes (Signed)
Subjective:    Patient ID: Monique Ballard, female    DOB: February 05, 1940, 81 y.o.   MRN: [id number]  DOS:  02/26/2021 Type of visit - description: Virtual Visit via Video Note  I connected with the above patient  by a video enabled telemedicine application and verified that I am speaking with the correct person using two identifiers.   THIS ENCOUNTER IS A VIRTUAL VISIT DUE TO COVID-19 - PATIENT WAS NOT SEEN IN THE OFFICE. PATIENT HAS CONSENTED TO VIRTUAL VISIT / TELEMEDICINE VISIT   Location of patient: home  Location of provider: office  Persons participating in the virtual visit: patient, provider   I discussed the limitations of evaluation and management by telemedicine and the availability of in person appointments. The patient expressed understanding and agreed to proceed.  Follow-up Since the last office visit is doing well. Gait d/o- She completed physical therapy. No further falls. No recent ambulatory BPs. Good compliance with medications.  Review of Systems See above   Past Medical History:  Diagnosis Date   Anxiety    Breast cancer ([place]) fall 1997    Left Lumpectomy, XRT.  treated with Tamoxifem and Evista   Depression    Endometriosis in her 20's   surgery to help clear endometriosis to obtain a pregnancy.   GERD (gastroesophageal reflux disease)    w/"stretching" remotely   HTN (hypertension)    Migraines     Past Surgical History:  Procedure Laterality Date   BREAST LUMPECTOMY Left fall 1997   Lymph nodes X 3 were negative, ER+, Radiation treatmetn.  Took Tamoxifem X 5 yrs then Evista for 3-5 yrs.   CESAREAN SECTION     x2  first pregnancy breach and second normal   surgery for endometriosis      Current Outpatient Medications  Medication Instructions   aspirin EC 81 mg, Oral, Daily, Swallow whole.   escitalopram (LEXAPRO) 20 MG tablet TAKE ONE TABLET BY MOUTH ONE TIME DAILY **Schedule appt**   folic acid (FOLVITE) 1 mg, Oral, Daily   OLANZapine  (ZYPREXA) 7.5 MG tablet TAKE ONE TABLET BY MOUTH DAILY AT BEDTIME   ondansetron (ZOFRAN) 8 mg, Oral, Every 8 hours PRN   pantoprazole (PROTONIX) 40 mg, Oral, Daily   thiamine 100 mg, Oral, Daily       Objective:   Physical Exam Ht 5\' 3"  (1.6 m)    Wt 110 lb (49.9 kg)    LMP 01/22/1992 (Within Months)    BMI 19.49 kg/m  This is a virtual video visit, she is alert oriented x3, seems to be in no distress.  No vital signs available today    Assessment      Assessment HTN Depression, Anxiety - Dr Clovis Pu  Tremor dx [phone] Gait d/o: unsteady onset ~ 2017 GERD with esophageal stricture L Breast cancer, 1997, lumpectomy, XRT, released from oncology ETOH Stroke per MRI brain (2021), rx ASA per neuro Fatty liver per [place]  03/07/2020 Hypoxemia: Found on admission 09/30/2019.  On nocturnal oxygen as of 07-2020.  Saw pulmonary, did not pursue work-up   PLAN: HTN: No BP available today Depression and anxiety: Continue to be well controlled on Lexapro and Zyprexa. Gait disorder: Since last visit, had PT, no further falls EtOH: History of drinks 1 or 2 glasses of wine a day.  According to the patient less than before. Preventive care: Up-to-date on flu and COVID vaccines. RTC 3 months in person routine checkup.    I discussed the assessment and  treatment plan with the patient. The patient was provided an opportunity to ask questions and all were answered. The patient agreed with the plan and demonstrated an understanding of the instructions.   The patient was advised to call back or seek an in-person evaluation if the symptoms worsen or if the condition fails to improve as anticipated.

## 2021-02-27 NOTE — Assessment & Plan Note (Signed)
HTN: No BP available today Depression and anxiety: Continue to be well controlled on Lexapro and Zyprexa. Gait disorder: Since last visit, had PT, no further falls EtOH: History of drinks 1 or 2 glasses of wine a day.  According to the patient less than before. Preventive care: Up-to-date on flu and COVID vaccines. RTC 3 months in person routine checkup.

## 2021-03-23 ENCOUNTER — Encounter: Payer: Self-pay | Admitting: Internal Medicine

## 2021-05-01 ENCOUNTER — Ambulatory Visit (INDEPENDENT_AMBULATORY_CARE_PROVIDER_SITE_OTHER): Payer: Medicare Other

## 2021-05-01 VITALS — Ht 63.0 in | Wt 133.0 lb

## 2021-05-01 DIAGNOSIS — Z0000 Encounter for general adult medical examination without abnormal findings: Secondary | ICD-10-CM | POA: Diagnosis not present

## 2021-05-01 NOTE — Progress Notes (Addendum)
? ?Subjective:  ? Monique Ballard is a 81 y.o. female who presents for Medicare Annual (Subsequent) preventive examination. ? ?Review of Systems    ?Virtual Visit via Telephone Note ? ?I connected with  Monique Ballard on 05/01/21 at 11:15 AM EDT by telephone and verified that I am speaking with the correct person using two identifiers. ? ?Location: ?Patient: Home ?Provider: Office ?Persons participating in the virtual visit: patient/Nurse Health Advisor ?  ?I discussed the limitations, risks, security and privacy concerns of performing an evaluation and management service by telephone and the availability of in person appointments. The patient expressed understanding and agreed to proceed. ? ?Interactive audio and video telecommunications were attempted between this nurse and patient, however failed, due to patient having technical difficulties OR patient did not have access to video capability.  We continued and completed visit with audio only. ? ?Some vital signs may be absent or patient reported.  ? ?Criselda Peaches, LPN  ?Cardiac Risk Factors include: advanced age (>27mn, >>55women);hypertension ? ?   ?Objective:  ?  ?Today's Vitals  ? 05/01/21 1127  ?Weight: 133 lb (60.3 kg)  ?Height: '5\' 3"'$  (1.6 m)  ? ?Body mass index is 23.56 kg/m?. ? ? ?  05/01/2021  ? 11:36 AM 04/27/2020  ?  2:07 PM 10/30/2019  ?  3:23 AM 10/01/2019  ?  9:24 AM 09/30/2019  ?  1:08 PM 09/05/2019  ?  6:12 PM 04/26/2019  ?  2:21 PM  ?Advanced Directives  ?Does Patient Have a Medical Advance Directive? Yes No No No No No No  ?Type of AParamed[place]of [place]ive [place] will        ?Does patient want to make changes to medical advance directive? No - Patient declined        ?Copy of [place]? No - copy requested        ?Would patient like information on creating a medical advance directive?  Yes (MAU/Ambulatory/Procedural Areas - Information given)  No - Patient declined  No - Patient declined No - Patient  declined  ? ? ?Current Medications (verified) ?Outpatient Encounter Medications as of 05/01/2021  ?Medication Sig  ? aspirin EC 81 MG tablet Take 1 tablet (81 mg total) by mouth daily. Swallow whole.  ? escitalopram (LEXAPRO) 20 MG tablet TAKE ONE TABLET BY MOUTH ONE TIME DAILY **Schedule appt**  ? folic acid (FOLVITE) 1 MG tablet Take 1 tablet (1 mg total) by mouth daily.  ? OLANZapine (ZYPREXA) 7.5 MG tablet TAKE ONE TABLET BY MOUTH DAILY AT BEDTIME  ? ondansetron (ZOFRAN) 8 MG tablet Take 1 tablet (8 mg total) by mouth every 8 (eight) hours as needed for nausea or vomiting. (Patient not taking: Reported on 02/26/2021)  ? pantoprazole (PROTONIX) 40 MG tablet Take 1 tablet (40 mg total) by mouth daily.  ? thiamine 100 MG tablet Take 1 tablet (100 mg total) by mouth daily.  ? ?No facility-administered encounter medications on file as of 05/01/2021.  ? ? ?Allergies (verified) ?Penicillins  ? ?History: ?Past Medical History:  ?Diagnosis Date  ? Anxiety   ? [place]  ?  Left Lumpectomy, XRT.  treated with Tamoxifem and Evista  ? Depression   ? Endometriosis in her 20's  ? surgery to help clear endometriosis to obtain a pregnancy.  ? GERD (gastroesophageal reflux disease)   ? w/"stretching" remotely  ? HTN (hypertension)   ? Migraines   ? ?Past Surgical  History:  ?Procedure Laterality Date  ? BREAST LUMPECTOMY Left fall 1997  ? Lymph nodes X 3 were negative, ER+, Radiation treatmetn.  Took Tamoxifem X 5 yrs then Evista for 3-5 yrs.  ? CESAREAN SECTION    ? x2  first pregnancy breach and second normal  ? surgery for endometriosis    ? ?Family History  ?Problem Relation Age of Onset  ? Hypertension Mother   ? Stroke Mother   ? Heart attack Father   ?     elderly  ? Heart disease Father   ?     CHF  ? Throat cancer Other   ? Breast cancer Neg Hx   ? Colon cancer Neg Hx   ? Diabetes Neg Hx   ? Neuropathy Neg Hx   ? ?Social History  ? ?Socioeconomic History  ? Marital status: Married  ?  Spouse name: Not on  file  ? Number of children: 2  ? Years of education: Not on file  ? Highest education level: Bachelor's degree (e.g., BA, AB, BS)  ?Occupational History  ? Occupation: retired  ?Tobacco Use  ? Smoking status: Former  ?  Packs/day: 0.50  ?  Years: 25.00  ?  Pack years: 12.50  ?  Types: Cigarettes  ?  Quit date: 48  ?  Years since quitting: 33.2  ? Smokeless tobacco: Never  ?Vaping Use  ? Vaping Use: Never used  ?Substance and Sexual Activity  ? Alcohol use: Yes  ?  Alcohol/week: 7.0 standard drinks  ?  Types: 7 Glasses of wine per week  ?  Comment: 1 glass wine dialy  ? Drug use: No  ? Sexual activity: Never  ?  Birth control/protection: None  ?Other Topics Concern  ? Not on file  ?Social History Narrative  ? Lives w/ husband and son  ? 1 Older Brother passed away  ? Occupation: retired 2011 from [employer] office  ? Son lives with her as of 12/2016   ? 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 Caffeine: 1 cup coffee daily  ?   ? ?Social Determinants of Health  ? ?Financial Resource Strain: Low Risk   ? Difficulty of Paying Living Expenses: Not hard at all  ?Food Insecurity: No Food Insecurity  ? Worried About Charity fundraiser in the Last Year: Never true  ? Ran Out of Food in the Last Year: Never true  ?Transportation Needs: No Transportation Needs  ? Lack of Transportation (Medical): No  ? Lack of Transportation (Non-Medical): No  ?Physical Activity: Inactive  ? Days of Exercise per Week: 0 days  ? Minutes of Exercise per Session: 0 min  ?Stress: No Stress Concern Present  ? Feeling of Stress : Not at all  ?Social Connections: Socially Integrated  ? Frequency of Communication with Friends and Family: More than three times a week  ? Frequency of Social Gatherings with Friends and Family: More than three times a week  ? Attends Religious Services: More than 4 times per year  ? Active Member of Clubs or Organizations: Yes  ? Attends Archivist  Meetings: More than 4 times per year  ? Marital Status: Married  ? ? ?Clinical Intake: ? ? ?Diabetic?  No ? ? ?Activities of Daily Living ? ?  05/01/2021  ? 11:35 AM  ?In your present state of health, do you have any difficulty performing the following activities:  ?  Hearing? 0  ?Vision? 0  ?Difficulty concentrating or making decisions? 0  ?Walking or climbing stairs? 0  ?Dressing or bathing? 0  ?Doing errands, shopping? 0  ?Preparing Food and eating ? N  ?Using the Toilet? N  ?In the past six months, have you accidently leaked urine? N  ?Do you have problems with loss of bowel control? N  ?Managing your Medications? N  ?Managing your Finances? N  ?Housekeeping or managing your Housekeeping? N  ? ? ?Patient Care Team: ?Colon Branch, MD as PCP - General ?Ronald Lobo, MD as Consulting Physician (Gastroenterology) ?Kem Boroughs, FNP as Nurse Practitioner (Family Medicine) ?Hunsucker, Bonna Gains, MD as Consulting Physician (Pulmonary Disease) ? ?Indicate any recent Medical Services you may have received from other than [facility] providers in the past year (date may be approximate). ? ?   ?Assessment:  ? This is a routine wellness examination for [place]. ? ?Hearing/Vision screen ?Hearing Screening - Comments:: No hearing difficulty ?Vision Screening - Comments:: Wears glasses. Followed by Dr Sherlean Foot ? ?Dietary issues and exercise activities discussed: ?Exercise limited by: None identified ? ? Goals Addressed   ? ?  ?  ?  ?  ?  ? This Visit's Progress  ?   Patient Stated (pt-stated)     ?   Maintain current health. ?  ? ?  ? ?Depression Screen ? ?  05/01/2021  ? 11:33 AM 02/26/2021  ?  2:22 PM 10/25/2020  ?  2:29 PM 07/25/2020  ?  2:03 PM 04/27/2020  ?  2:09 PM 03/03/2020  ?  2:43 PM 12/01/2019  ?  2:03 PM  ?PHQ 2/9 Scores  ?PHQ - 2 Score 0 0 0 0 0 2   ?PHQ- 9 Score  0 0 0  9   ?Exception Documentation       Other- indicate reason in comment box  ?Not completed       defered to psych  ?  ?Fall Risk ? ?  05/01/2021  ? 11:36 AM 02/26/2021  ?   2:23 PM 10/25/2020  ?  2:30 PM 07/25/2020  ?  1:20 PM 07/12/2020  ? 12:52 PM  ?Fall Risk   ?Falls in the past year? 0 '1 1 1 1  '$ ?Number falls in past yr: 0 0 '1 1 1  '$ ?Injury with Fall? 0 1 0 0 1  ?Risk

## 2021-05-01 NOTE — Patient Instructions (Addendum)
?Ms. Goodwill , ?Thank you for taking time to come for your Medicare Wellness Visit. I appreciate your ongoing commitment to your health goals. Please review the following plan we discussed and let me know if I can assist you in the future.  ? ?These are the goals we discussed: ? Goals   ? ?   Continue PT balance exercises to prevent more falls. (pt-stated)   ?   Patient Stated (pt-stated)   ?   Maintain current health. ?  ? ?  ?  ?This is a list of the screening recommended for you and due dates:  ?Health Maintenance  ?Topic Date Due  ? Zoster (Shingles) Vaccine (1 of 2) Never done  ? Hepatitis C Screening: USPSTF Recommendation to screen - Ages 18-79 yo.  05/02/2022*  ? Flu Shot  08/21/2021  ? Mammogram  09/16/2021  ? Tetanus Vaccine  06/09/2028  ? Pneumonia Vaccine  Completed  ? DEXA scan (bone density measurement)  Completed  ? COVID-19 Vaccine  Completed  ? HPV Vaccine  Aged Out  ? Colon Cancer Screening  Discontinued  ?*Topic was postponed. The date shown is not the original due date.  ? ?Advanced directives: Yes Copies on file ? ?Conditions/risks identified: None ? ?Next appointment: Follow up in one year for your annual wellness visit  ? ? ? ?Preventive Care 70 Years and Older, Female ?Preventive care refers to lifestyle choices and visits with your health care provider that can promote health and wellness. ?What does preventive care include? ?A yearly physical exam. This is also called an annual well check. ?Dental exams once or twice a year. ?Routine eye exams. Ask your health care provider how often you should have your eyes checked. ?Personal lifestyle choices, including: ?Daily care of your teeth and gums. ?Regular physical activity. ?Eating a healthy diet. ?Avoiding tobacco and drug use. ?Limiting alcohol use. ?Practicing safe sex. ?Taking low-dose aspirin every day. ?Taking vitamin and mineral supplements as recommended by your health care provider. ?What happens during an annual well check? ?The  services and screenings done by your health care provider during your annual well check will depend on your age, overall health, lifestyle risk factors, and family history of disease. ?Counseling  ?Your health care provider may ask you questions about your: ?Alcohol use. ?Tobacco use. ?Drug use. ?Emotional well-being. ?Home and relationship well-being. ?Sexual activity. ?Eating habits. ?History of falls. ?Memory and ability to understand (cognition). ?Work and work Statistician. ?Reproductive health. ?Screening  ?You may have the following tests or measurements: ?Height, weight, and BMI. ?Blood pressure. ?Lipid and cholesterol levels. These may be checked every 5 years, or more frequently if you are over 54 years old. ?Skin check. ?Lung cancer screening. You may have this screening every year starting at age 93 if you have a 30-pack-year history of smoking and currently smoke or have quit within the past 15 years. ?Fecal occult blood test (FOBT) of the stool. You may have this test every year starting at age 58. ?Flexible sigmoidoscopy or colonoscopy. You may have a sigmoidoscopy every 5 years or a colonoscopy every 10 years starting at age 64. ?Hepatitis C blood test. ?Hepatitis B blood test. ?Sexually transmitted disease (STD) testing. ?Diabetes screening. This is done by checking your blood sugar (glucose) after you have not eaten for a while (fasting). You may have this done every 1-3 years. ?Bone density scan. This is done to screen for osteoporosis. You may have this done starting at age 34. ?Mammogram. This may be  done every 1-2 years. Talk to your health care provider about how often you should have regular mammograms. ?Talk with your health care provider about your test results, treatment options, and if necessary, the need for more tests. ?Vaccines  ?Your health care provider may recommend certain vaccines, such as: ?Influenza vaccine. This is recommended every year. ?Tetanus, diphtheria, and acellular  pertussis (Tdap, Td) vaccine. You may need a Td booster every 10 years. ?Zoster vaccine. You may need this after age 27. ?Pneumococcal 13-valent conjugate (PCV13) vaccine. One dose is recommended after age 70. ?Pneumococcal polysaccharide (PPSV23) vaccine. One dose is recommended after age 6. ?Talk to your health care provider about which screenings and vaccines you need and how often you need them. ?This information is not intended to replace advice given to you by your health care provider. Make sure you discuss any questions you have with your health care provider. ?Document Released: 02/03/2015 Document Revised: 09/27/2015 Document Reviewed: 11/08/2014 ?Elsevier Interactive Patient Education ? 2017 [place]. ? ?Fall Prevention in the Home ?Falls can cause injuries. They can happen to people of all ages. There are many things you can do to make your home safe and to help prevent falls. ?What can I do on the outside of my home? ?Regularly fix the edges of walkways and driveways and fix any cracks. ?Remove anything that might make you trip as you walk through a door, such as a raised step or threshold. ?Trim any bushes or trees on the path to your home. ?Use bright outdoor lighting. ?Clear any walking paths of anything that might make someone trip, such as rocks or tools. ?Regularly check to see if handrails are loose or broken. Make sure that both sides of any steps have handrails. ?Any raised decks and porches should have guardrails on the edges. ?Have any leaves, snow, or ice cleared regularly. ?Use sand or salt on walking paths during winter. ?Clean up any spills in your garage right away. This includes oil or grease spills. ?What can I do in the bathroom? ?Use night lights. ?Install grab bars by the toilet and in the tub and shower. Do not use towel bars as grab bars. ?Use non-skid mats or decals in the tub or shower. ?If you need to sit down in the shower, use a plastic, non-slip stool. ?Keep the floor  dry. Clean up any water that spills on the floor as soon as it happens. ?Remove soap buildup in the tub or shower regularly. ?Attach bath mats securely with double-sided non-slip rug tape. ?Do not have throw rugs and other things on the floor that can make you trip. ?What can I do in the bedroom? ?Use night lights. ?Make sure that you have a light by your bed that is easy to reach. ?Do not use any sheets or blankets that are too big for your bed. They should not hang down onto the floor. ?Have a firm chair that has side arms. You can use this for support while you get dressed. ?Do not have throw rugs and other things on the floor that can make you trip. ?What can I do in the kitchen? ?Clean up any spills right away. ?Avoid walking on wet floors. ?Keep items that you use a lot in easy-to-reach places. ?If you need to reach something above you, use a strong step stool that has a grab bar. ?Keep electrical cords out of the way. ?Do not use floor polish or wax that makes floors slippery. If you must use wax,  use non-skid floor wax. ?Do not have throw rugs and other things on the floor that can make you trip. ?What can I do with my stairs? ?Do not leave any items on the stairs. ?Make sure that there are handrails on both sides of the stairs and use them. Fix handrails that are broken or loose. Make sure that handrails are as long as the stairways. ?Check any carpeting to make sure that it is firmly attached to the stairs. Fix any carpet that is loose or worn. ?Avoid having throw rugs at the top or bottom of the stairs. If you do have throw rugs, attach them to the floor with carpet tape. ?Make sure that you have a light switch at the top of the stairs and the bottom of the stairs. If you do not have them, ask someone to add them for you. ?What else can I do to help prevent falls? ?Wear shoes that: ?Do not have high heels. ?Have rubber bottoms. ?Are comfortable and fit you well. ?Are closed at the toe. Do not wear  sandals. ?If you use a stepladder: ?Make sure that it is fully opened. Do not climb a closed stepladder. ?Make sure that both sides of the stepladder are locked into place. ?Ask someone to hold it for you, if possible

## 2021-05-03 ENCOUNTER — Encounter: Payer: Self-pay | Admitting: Medical

## 2021-05-03 ENCOUNTER — Ambulatory Visit (HOSPITAL_BASED_OUTPATIENT_CLINIC_OR_DEPARTMENT_OTHER)
Admission: RE | Admit: 2021-05-03 | Discharge: 2021-05-03 | Disposition: A | Discharge status: Home / Self Care | Payer: Medicare Other | Source: Ambulatory Visit | Attending: Medical | Admitting: Medical

## 2021-05-03 ENCOUNTER — Ambulatory Visit (INDEPENDENT_AMBULATORY_CARE_PROVIDER_SITE_OTHER): Payer: Medicare Other | Admitting: Medical

## 2021-05-03 VITALS — BP 130/78 | HR 100 | Resp 18 | Ht 63.0 in | Wt 137.0 lb

## 2021-05-03 DIAGNOSIS — R059 Cough, unspecified: Secondary | ICD-10-CM

## 2021-05-03 DIAGNOSIS — M545 Low back pain, unspecified: Secondary | ICD-10-CM

## 2021-05-03 DIAGNOSIS — Z122 Encounter for screening for malignant neoplasm of respiratory organs: Secondary | ICD-10-CM

## 2021-05-03 MED ORDER — METHYLPREDNISOLONE 4 MG PO TABS: ORAL_TABLET | ORAL | 0 refills | Status: DC

## 2021-05-03 MED ORDER — KETOROLAC TROMETHAMINE 30 MG/ML IJ SOLN
30.0000 mg | Freq: Once | INTRAMUSCULAR | Status: AC
  Administered 2021-05-03: 30 mg via INTRAMUSCULAR

## 2021-05-03 MED ORDER — BENZONATATE 100 MG PO CAPS: 100.0000 mg | ORAL_CAPSULE | Freq: Three times a day (TID) | ORAL | 0 refills | Status: DC | PRN

## 2021-05-03 NOTE — Addendum Note (Signed)
Addended by: Jeronimo Greaves on: 05/03/2021 10:39 AM ? ? Modules accepted: Orders ? ?

## 2021-05-03 NOTE — Patient Instructions (Signed)
Low back pain for 1 week.  On review some significant findings on prior MRI but thankfully you are not having any radicular type pain to lower extremities.  So presently pain might be from degenerative changes in lumbar spine and combination of muscle pain. ? ?We will give Toradol 30 mg IM injection today.  I decided to prescribe low-dose tapered Medrol as you did not get much response to ibuprofen recently.  Rx advisement given with Medrol. ? ?Intermittent coughing on exam and new for the last 2 days.  Decided to get chest x-ray today.  Prescribed benzonatate for cough and recommend trying Claritin over-the-counter in the event cough associated with allergies.  If infection seen on x-ray will give antibiotic.  ? ?On review history of smoking at least 20 years a pack a day.  Place referral for screening CT for lung cancer.  If COPD type changes seen would refer to pulmonologist for evaluation and treatment. ? ?Follow-up in 7 to 10 days or sooner if needed. ?

## 2021-05-03 NOTE — Progress Notes (Signed)
? ?Subjective:  ? ? Patient ID: Monique Ballard, female    DOB: 09-25-1940, 81 y.o.   MRN: [id number] ? ?HPI ? ? ?Pt has one week of low back pain that is new. Pt states no injury or preceding activity. Pain just in low back. No radicular  pain. No leg weakness. No incontinence. Pain level 6/10 even at rest.  ? ?States no hx of back pain. ? ?States she is taking ibuprofen 400 mg for pain. Does not help much. ?Good kidney function. No hx of stomach ulcers. Pt not diabetic. ? ?Pt  had mri of lumbar spine done in past to evaluate leg weakness. ? ?IMPRESSION: This MRI of the lumbar spine without contrast shows the following: ?1.  At L4-L5, there is minimal anterolisthesis and other degenerative changes causing mild to moderate bilateral lateral recess stenosis.  There is no spinal stenosis or nerve root compression. ?2.  Milder degenerative changes at other lumbar levels not causing any significant foraminal narrowing or lateral recess stenosis.  No spinal stenosis or nerve root compression. ? ?Coughing intemittent during the exam. States cough recently for 2 days. Pack a day smoker for 20 years. No wheezing. No recent sob. No leg pain. ? ?Review of Systems  ?Constitutional:  Negative for chills, fatigue and fever.  ?HENT:  Negative for congestion.   ?Respiratory:  Negative for choking, shortness of breath and wheezing.   ?Cardiovascular:  Negative for chest pain and palpitations.  ?Gastrointestinal:  Negative for abdominal pain, rectal pain and vomiting.  ?Musculoskeletal:  Positive for back pain.  ?Skin:  Negative for rash.  ?Neurological:  Negative for dizziness, seizures, syncope, weakness, numbness and headaches.  ?Hematological:  Does not bruise/bleed easily.  ?Psychiatric/Behavioral:  Negative for behavioral problems and confusion.   ? ? ?Past Medical History:  ?Diagnosis Date  ? Anxiety   ? Breast cancer ([place]) fall 1997  ?  Left Lumpectomy, XRT.  treated with Tamoxifem and Evista  ? Depression   ? Endometriosis  in her 20's  ? surgery to help clear endometriosis to obtain a pregnancy.  ? GERD (gastroesophageal reflux disease)   ? w/"stretching" remotely  ? HTN (hypertension)   ? Migraines   ? ?  ?Social History  ? ?Socioeconomic History  ? Marital status: Married  ?  Spouse name: Not on file  ? Number of children: 2  ? Years of education: Not on file  ? Highest education level: Bachelor's degree (e.g., BA, AB, BS)  ?Occupational History  ? Occupation: retired  ?Tobacco Use  ? Smoking status: Former  ?  Packs/day: 0.50  ?  Years: 25.00  ?  Pack years: 12.50  ?  Types: Cigarettes  ?  Quit date: 61  ?  Years since quitting: 33.3  ? Smokeless tobacco: Never  ?Vaping Use  ? Vaping Use: Never used  ?Substance and Sexual Activity  ? Alcohol use: Yes  ?  Alcohol/week: 7.0 standard drinks  ?  Types: 7 Glasses of wine per week  ?  Comment: 1 glass wine dialy  ? Drug use: No  ? Sexual activity: Never  ?  Birth control/protection: None  ?Other Topics Concern  ? Not on file  ?Social History Narrative  ? Lives w/ husband and son  ? 1 Older Brother passed away  ? 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 Son lives with her as of 12/2016   ? 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 Caffeine: 1 cup coffee daily  ?   ? ?Social Determinants of Health  ? ?Financial Resource Strain: Low Risk   ? Difficulty of Paying Living Expenses: Not hard at all  ?Food Insecurity: No Food Insecurity  ? Worried About Charity fundraiser in the Last Year: Never true  ? Ran Out of Food in the Last Year: Never true  ?Transportation Needs: No Transportation Needs  ? Lack of Transportation (Medical): No  ? Lack of Transportation (Non-Medical): No  ?Physical Activity: Inactive  ? Days of Exercise per Week: 0 days  ? Minutes of Exercise per Session: 0 min  ?Stress: No Stress Concern Present  ? Feeling of Stress : Not at all  ?Social Connections: Socially Integrated  ? Frequency of Communication  with Friends and Family: More than three times a week  ? Frequency of Social Gatherings with Friends and Family: More than three times a week  ? Attends Religious Services: More than 4 times per year  ? Active Member of Clubs or Organizations: Yes  ? Attends Archivist Meetings: More than 4 times per year  ? Marital Status: Married  ?Intimate Partner Violence: Not At Risk  ? Fear of Current or Ex-Partner: No  ? Emotionally Abused: No  ? Physically Abused: No  ? Sexually Abused: No  ? ? ?Past Surgical History:  ?Procedure Laterality Date  ? BREAST LUMPECTOMY Left fall 1997  ? Lymph nodes X 3 were negative, ER+, Radiation treatmetn.  Took Tamoxifem X 5 yrs then Evista for 3-5 yrs.  ? CESAREAN SECTION    ? x2  first pregnancy breach and second normal  ? surgery for endometriosis    ? ? ?Family History  ?Problem Relation Age of Onset  ? Hypertension Mother   ? Stroke Mother   ? Heart attack Father   ?     elderly  ? Heart disease Father   ?     CHF  ? Throat cancer Other   ? Breast cancer Neg Hx   ? Colon cancer Neg Hx   ? Diabetes Neg Hx   ? Neuropathy Neg Hx   ? ? ?Allergies  ?Allergen Reactions  ? Penicillins Rash  ?  Has patient had a PCN reaction causing immediate rash, facial/tongue/throat swelling, SOB or lightheadedness with hypotension: Yes ?Has patient had a PCN reaction causing severe rash involving mucus membranes or skin necrosis: No ?Has patient had a PCN reaction that required hospitalization No ?Has patient had a PCN reaction occurring within the last 10 years: No ?If all of the above answers are "NO", then may proceed with Cephalosporin use. ?  ? ? ?Current Outpatient Medications on File Prior to Visit  ?Medication Sig Dispense Refill  ? aspirin EC 81 MG tablet Take 1 tablet (81 mg total) by mouth daily. Swallow whole. 30 tablet 11  ? escitalopram (LEXAPRO) 20 MG tablet TAKE ONE TABLET BY MOUTH ONE TIME DAILY **Schedule appt** 90 tablet 0  ? folic acid (FOLVITE) 1 MG tablet Take 1 tablet (1 mg  total) by mouth daily. 90 tablet 1  ? OLANZapine (ZYPREXA) 7.5 MG tablet TAKE ONE TABLET BY MOUTH DAILY AT BEDTIME 90 tablet 0  ? ondansetron (ZOFRAN) 8 MG tablet Take 1 tablet (8 mg total) by mouth every 8 (eight) hours as needed for nausea or vomiting. 21 tablet 0  ? pantoprazole (PROTONIX) 40 MG tablet Take 1 tablet (40 mg total) by mouth daily. 90 tablet  1  ? thiamine 100 MG tablet Take 1 tablet (100 mg total) by mouth daily. 30 tablet 6  ? ?No current facility-administered medications on file prior to visit.  ? ? ?BP 130/78   Pulse 100   Resp 18   Ht '5\' 3"'$  (1.6 m)   Wt 137 lb (62.1 kg)   LMP 01/22/1992 (Within Months)   SpO2 93%   BMI 24.27 kg/m?  ? 02 sat 94%. Initially lower but finger felt cold. Then did increase to 94% ?   ?Objective:  ? Physical Exam ? ?General Appearance- Not in acute distress. ? ? ? ?Chest and Lung Exam ?Auscultation: ?Breath sounds:-Normal. Clear even and unlabored. ?Adventitious sounds:- No Adventitious sounds. ? ?Cardiovascular ?Auscultation:Rythm - Regular, rate and rythm. Heart Sounds -Normal heart sounds. ? ?Abdomen ?Inspection:-Inspection Normal.  ?Palpation/Perucssion: Palpation and Percussion of the abdomen reveal- Non Tender, No Rebound tenderness, No rigidity(Guarding) and No Palpable abdominal masses.  ?Liver:-Normal.  ?Spleen:- Normal.  ? ?Back ?Mid  upper lumbar spine tenderness to palpation. ?No Pain on straight leg lift. ?Pain on lateral movements and flexion/extension of the spine. ? ?Lower ext neurologic ? ?L5-S1 sensation intact bilaterally. ?Normal patellar reflexes bilaterally. ?No foot drop bilaterally.  ? ?Lower ext- calfs symmetric. No pedal edema. Negative homans signs.  ?   ?Assessment & Plan:  ? ?Patient Instructions  ?Low back pain for 1 week.  On review some significant findings on prior MRI but thankfully you are not having any radicular type pain to lower extremities.  So presently pain might be from degenerative changes in lumbar spine and  combination of muscle pain. ? ?We will give Toradol 30 mg IM injection today.  I decided to prescribe low-dose tapered Medrol as you did not get much response to ibuprofen recently.  Rx advisement given with Medrol

## 2021-05-15 ENCOUNTER — Encounter: Payer: Self-pay | Admitting: Internal Medicine

## 2021-05-15 ENCOUNTER — Ambulatory Visit (INDEPENDENT_AMBULATORY_CARE_PROVIDER_SITE_OTHER): Payer: Medicare Other | Admitting: Internal Medicine

## 2021-05-15 VITALS — BP 118/76 | HR 79 | Temp 98.3°F | Resp 18 | Ht 63.0 in | Wt 129.5 lb

## 2021-05-15 DIAGNOSIS — M810 Age-related osteoporosis without current pathological fracture: Secondary | ICD-10-CM | POA: Diagnosis not present

## 2021-05-15 MED ORDER — ALENDRONATE SODIUM 70 MG PO TABS: 70.0000 mg | ORAL_TABLET | ORAL | 3 refills | Status: DC

## 2021-05-15 NOTE — Patient Instructions (Addendum)
Start taking Fosamax 70 mg once a week. ? ?Take it in the morning, before breakfast, with plenty of water. ? ?Do not eat or go back to bed for 45 minutes. ? ?If you develop chest pain or severe acid reflux let us know ? ? ? ? ?GO TO THE FRONT DESK, PLEASE SCHEDULE YOUR APPOINTMENTS ?Come back for a checkup in about 2 or 3 months ?

## 2021-05-15 NOTE — Progress Notes (Signed)
? ?Subjective:  ? ? Patient ID: Monique Ballard, female    DOB: [birth year], 81 y.o.   MRN: [id number] ? ?DOS:  05/15/2021 ?Type of visit - description: Acute ? ?Was seen at this office approximately 12 days ago. ?At the time she complained of cough back pain. ?She was prescribed prednisone and a cough suppressant. ?Also, x-ray: ?Age-indeterminate compression deformities of the T3 and T11 vertebra. ?They are new since 2021. ?Also, age-indeterminate fracture of the eighth rib ? ?She is here for a follow-up regards to x-ray. ? ?Actually the back pain essentially resolved. ?Cough is much better ? ?Review of Systems ?See above  ? ?Past Medical History:  ?Diagnosis Date  ? Anxiety   ? Breast cancer ([place]) fall 1997  ?  Left Lumpectomy, XRT.  treated with Tamoxifem and Evista  ? Depression   ? Endometriosis in her 20's  ? surgery to help clear endometriosis to obtain a pregnancy.  ? GERD (gastroesophageal reflux disease)   ? w/"stretching" remotely  ? HTN (hypertension)   ? Migraines   ? ? ?Past Surgical History:  ?Procedure Laterality Date  ? BREAST LUMPECTOMY Left fall 1997  ? Lymph nodes X 3 were negative, ER+, Radiation treatmetn.  Took Tamoxifem X 5 yrs then Evista for 3-5 yrs.  ? CESAREAN SECTION    ? x2  first pregnancy breach and second normal  ? surgery for endometriosis    ? ? ?Current Outpatient Medications  ?Medication Instructions  ? aspirin EC 81 mg, Oral, Daily, Swallow whole.  ? benzonatate (TESSALON) 100 mg, Oral, 3 times daily PRN  ? escitalopram (LEXAPRO) 20 MG tablet TAKE ONE TABLET BY MOUTH ONE TIME DAILY **Schedule appt**  ? folic acid (FOLVITE) 1 mg, Oral, Daily  ? methylPREDNISolone (MEDROL) 4 MG tablet Standard 6 day taper  ? OLANZapine (ZYPREXA) 7.5 MG tablet TAKE ONE TABLET BY MOUTH DAILY AT BEDTIME  ? ondansetron (ZOFRAN) 8 mg, Oral, Every 8 hours PRN  ? pantoprazole (PROTONIX) 40 mg, Oral, Daily  ? thiamine 100 mg, Oral, Daily  ? ? ?   ?Objective:  ? Physical Exam ?BP 118/76 (BP Location: Left  Arm, Patient Position: Sitting, Cuff Size: Small)   Pulse 79   Temp 98.3 ?F (36.8 ?C) (Oral)   Resp 18   Ht '5\' 3"'$  (1.6 m)   Wt 129 lb 8 oz (58.7 kg)   LMP 01/22/1992 (Within Months)   SpO2 94%   BMI 22.94 kg/m?  ?General:   ?Well developed, NAD, BMI noted. ?HEENT:  ?Normocephalic . Face symmetric, atraumatic ?Skin: Not pale. Not jaundice ?Neurologic:  ?alert & oriented X3.  ?Speech normal, gait  assisted by a cane ?Psych--  ?Cognition and judgment appear intact.  ?Cooperative with normal attention span and concentration.  ?Behavior appropriate. ?No anxious or depressed appearing.  ? ?   ?Assessment   ? ?  ?Assessment ?HTN ?Depression, Anxiety - Dr Clovis Pu  ?Tremor dx 11-2014 ?Gait d/o: unsteady onset ~ 2017 ?GERD with esophageal stricture ?L Breast cancer, 1997, lumpectomy, XRT, released from oncology ?ETOH ?Stroke per MRI brain (2021), rx ASA per neuro ?Fatty liver per [place]  03/07/2020 ?Hypoxemia: Found on admission 09/30/2019.  On nocturnal oxygen as of 07-2020.  Saw pulmonary, did not pursue work-up ? ? ?PLAN: ?Osteoporosis: ?Last T score was -2.1 in 2021. ?On a x-ray done few days ago she was found to have 2 vertebral compression fractures.  With these findings is more pressing than before to treat her osteoporosis. ?Treatment was discussed  07-2020, b/c her h/o dysphagia I prescribed Prolia however we were never able to get that for her. ?I  reviewed the GI notes, they reported history of dysphagia, responsive to PPIs but not responded to dilatation in 2004. ?On further questioning, she has not had any dysphagia to pills in long time. ?Based on this information and recent finding of vertebral fractures  I recommend Fosamax, precautions carefully discussed with the pt.  We agreed to proceed. ?See instructions. ?RTC 2 to 3 months ?  ? ? ?This visit occurred during the SARS-CoV-2 public health emergency.  Safety protocols were in place, including screening questions prior to the visit, additional usage of staff  PPE, and extensive cleaning of exam room while observing appropriate contact time as indicated for disinfecting solutions.  ? ?

## 2021-05-16 NOTE — Assessment & Plan Note (Signed)
Osteoporosis: ?Last T score was -2.1 in 2021. ?On a x-ray done few days ago she was found to have 2 vertebral compression fractures.  With these findings is more pressing than before to treat her osteoporosis. ?Treatment was discussed 07-2020, b/c her h/o dysphagia I prescribed Prolia however we were never able to get that for her. ?I  reviewed the GI notes, they reported history of dysphagia, responsive to PPIs but not responded to dilatation in 2004. ?On further questioning, she has not had any dysphagia to pills in long time. ?Based on this information and recent finding of vertebral fractures  I recommend Fosamax, precautions carefully discussed with the pt.  We agreed to proceed. ?See instructions. ?RTC 2 to 3 months ?

## 2021-07-08 ENCOUNTER — Emergency Department (HOSPITAL_COMMUNITY): Payer: Medicare Other

## 2021-07-08 ENCOUNTER — Encounter (HOSPITAL_COMMUNITY): Payer: Self-pay

## 2021-07-08 ENCOUNTER — Other Ambulatory Visit: Payer: Self-pay

## 2021-07-08 ENCOUNTER — Inpatient Hospital Stay (HOSPITAL_COMMUNITY)
Admission: EM | Admit: 2021-07-08 | Discharge: 2021-07-15 | DRG: 982 | Disposition: A | Discharge status: Rehabilitation Facility | Payer: Medicare Other | Attending: Family Medicine | Admitting: Family Medicine

## 2021-07-08 DIAGNOSIS — R5381 Other malaise: Secondary | ICD-10-CM | POA: Diagnosis not present

## 2021-07-08 DIAGNOSIS — I959 Hypotension, unspecified: Secondary | ICD-10-CM | POA: Diagnosis not present

## 2021-07-08 DIAGNOSIS — I272 Pulmonary hypertension, unspecified: Secondary | ICD-10-CM | POA: Diagnosis not present

## 2021-07-08 DIAGNOSIS — I280 Arteriovenous fistula of pulmonary vessels: Secondary | ICD-10-CM | POA: Diagnosis present

## 2021-07-08 DIAGNOSIS — M40204 Unspecified kyphosis, thoracic region: Secondary | ICD-10-CM | POA: Diagnosis present

## 2021-07-08 DIAGNOSIS — Z808 Family history of malignant neoplasm of other organs or systems: Secondary | ICD-10-CM

## 2021-07-08 DIAGNOSIS — J9621 Acute and chronic respiratory failure with hypoxia: Secondary | ICD-10-CM | POA: Diagnosis not present

## 2021-07-08 DIAGNOSIS — N390 Urinary tract infection, site not specified: Secondary | ICD-10-CM | POA: Diagnosis present

## 2021-07-08 DIAGNOSIS — R000 Tachycardia, unspecified: Secondary | ICD-10-CM | POA: Diagnosis present

## 2021-07-08 DIAGNOSIS — I10 Essential (primary) hypertension: Secondary | ICD-10-CM | POA: Diagnosis not present

## 2021-07-08 DIAGNOSIS — R0609 Other forms of dyspnea: Secondary | ICD-10-CM | POA: Diagnosis not present

## 2021-07-08 DIAGNOSIS — J9611 Chronic respiratory failure with hypoxia: Secondary | ICD-10-CM | POA: Diagnosis present

## 2021-07-08 DIAGNOSIS — F10239 Alcohol dependence with withdrawal, unspecified: Secondary | ICD-10-CM | POA: Diagnosis not present

## 2021-07-08 DIAGNOSIS — W19XXXA Unspecified fall, initial encounter: Secondary | ICD-10-CM | POA: Diagnosis not present

## 2021-07-08 DIAGNOSIS — S22080A Wedge compression fracture of T11-T12 vertebra, initial encounter for closed fracture: Secondary | ICD-10-CM | POA: Diagnosis not present

## 2021-07-08 DIAGNOSIS — K7460 Unspecified cirrhosis of liver: Secondary | ICD-10-CM | POA: Diagnosis present

## 2021-07-08 DIAGNOSIS — I5189 Other ill-defined heart diseases: Secondary | ICD-10-CM | POA: Diagnosis not present

## 2021-07-08 DIAGNOSIS — W1830XA Fall on same level, unspecified, initial encounter: Secondary | ICD-10-CM | POA: Diagnosis present

## 2021-07-08 DIAGNOSIS — R188 Other ascites: Secondary | ICD-10-CM | POA: Diagnosis present

## 2021-07-08 DIAGNOSIS — M7989 Other specified soft tissue disorders: Secondary | ICD-10-CM | POA: Diagnosis not present

## 2021-07-08 DIAGNOSIS — Z79899 Other long term (current) drug therapy: Secondary | ICD-10-CM

## 2021-07-08 DIAGNOSIS — B965 Pseudomonas (aeruginosa) (mallei) (pseudomallei) as the cause of diseases classified elsewhere: Secondary | ICD-10-CM | POA: Diagnosis present

## 2021-07-08 DIAGNOSIS — K8020 Calculus of gallbladder without cholecystitis without obstruction: Secondary | ICD-10-CM | POA: Diagnosis not present

## 2021-07-08 DIAGNOSIS — F32A Depression, unspecified: Secondary | ICD-10-CM | POA: Diagnosis present

## 2021-07-08 DIAGNOSIS — F0393 Unspecified dementia, unspecified severity, with mood disturbance: Secondary | ICD-10-CM | POA: Diagnosis present

## 2021-07-08 DIAGNOSIS — Z7982 Long term (current) use of aspirin: Secondary | ICD-10-CM

## 2021-07-08 DIAGNOSIS — R1310 Dysphagia, unspecified: Secondary | ICD-10-CM | POA: Diagnosis not present

## 2021-07-08 DIAGNOSIS — R609 Edema, unspecified: Secondary | ICD-10-CM | POA: Diagnosis not present

## 2021-07-08 DIAGNOSIS — K760 Fatty (change of) liver, not elsewhere classified: Secondary | ICD-10-CM | POA: Diagnosis present

## 2021-07-08 DIAGNOSIS — Z8774 Personal history of (corrected) congenital malformations of heart and circulatory system: Secondary | ICD-10-CM | POA: Diagnosis not present

## 2021-07-08 DIAGNOSIS — I700 Atherosclerosis of aorta: Secondary | ICD-10-CM | POA: Diagnosis not present

## 2021-07-08 DIAGNOSIS — S82831A Other fracture of upper and lower end of right fibula, initial encounter for closed fracture: Secondary | ICD-10-CM | POA: Diagnosis present

## 2021-07-08 DIAGNOSIS — D45 Polycythemia vera: Secondary | ICD-10-CM | POA: Diagnosis not present

## 2021-07-08 DIAGNOSIS — E876 Hypokalemia: Secondary | ICD-10-CM | POA: Diagnosis present

## 2021-07-08 DIAGNOSIS — J9600 Acute respiratory failure, unspecified whether with hypoxia or hypercapnia: Secondary | ICD-10-CM | POA: Diagnosis not present

## 2021-07-08 DIAGNOSIS — K219 Gastro-esophageal reflux disease without esophagitis: Secondary | ICD-10-CM | POA: Diagnosis present

## 2021-07-08 DIAGNOSIS — K7681 Hepatopulmonary syndrome: Secondary | ICD-10-CM | POA: Diagnosis present

## 2021-07-08 DIAGNOSIS — R339 Retention of urine, unspecified: Secondary | ICD-10-CM | POA: Diagnosis not present

## 2021-07-08 DIAGNOSIS — Z91199 Patient's noncompliance with other medical treatment and regimen due to unspecified reason: Secondary | ICD-10-CM

## 2021-07-08 DIAGNOSIS — Q2112 Patent foramen ovale: Secondary | ICD-10-CM

## 2021-07-08 DIAGNOSIS — F0392 Unspecified dementia, unspecified severity, with psychotic disturbance: Secondary | ICD-10-CM | POA: Diagnosis not present

## 2021-07-08 DIAGNOSIS — Z880 Allergy status to penicillin: Secondary | ICD-10-CM

## 2021-07-08 DIAGNOSIS — Z853 Personal history of malignant neoplasm of breast: Secondary | ICD-10-CM

## 2021-07-08 DIAGNOSIS — E8721 Acute metabolic acidosis: Secondary | ICD-10-CM | POA: Diagnosis present

## 2021-07-08 DIAGNOSIS — R0902 Hypoxemia: Secondary | ICD-10-CM | POA: Diagnosis not present

## 2021-07-08 DIAGNOSIS — Z8673 Personal history of transient ischemic attack (TIA), and cerebral infarction without residual deficits: Secondary | ICD-10-CM

## 2021-07-08 DIAGNOSIS — F1010 Alcohol abuse, uncomplicated: Secondary | ICD-10-CM | POA: Diagnosis present

## 2021-07-08 DIAGNOSIS — R778 Other specified abnormalities of plasma proteins: Secondary | ICD-10-CM | POA: Diagnosis present

## 2021-07-08 DIAGNOSIS — F1020 Alcohol dependence, uncomplicated: Secondary | ICD-10-CM | POA: Diagnosis present

## 2021-07-08 DIAGNOSIS — K449 Diaphragmatic hernia without obstruction or gangrene: Secondary | ICD-10-CM | POA: Diagnosis not present

## 2021-07-08 DIAGNOSIS — Z7902 Long term (current) use of antithrombotics/antiplatelets: Secondary | ICD-10-CM | POA: Diagnosis not present

## 2021-07-08 DIAGNOSIS — Z823 Family history of stroke: Secondary | ICD-10-CM

## 2021-07-08 DIAGNOSIS — Z923 Personal history of irradiation: Secondary | ICD-10-CM

## 2021-07-08 DIAGNOSIS — F0394 Unspecified dementia, unspecified severity, with anxiety: Secondary | ICD-10-CM | POA: Diagnosis present

## 2021-07-08 DIAGNOSIS — Z87891 Personal history of nicotine dependence: Secondary | ICD-10-CM | POA: Diagnosis not present

## 2021-07-08 DIAGNOSIS — D751 Secondary polycythemia: Secondary | ICD-10-CM | POA: Diagnosis present

## 2021-07-08 DIAGNOSIS — F419 Anxiety disorder, unspecified: Secondary | ICD-10-CM | POA: Diagnosis present

## 2021-07-08 DIAGNOSIS — E519 Thiamine deficiency, unspecified: Secondary | ICD-10-CM | POA: Diagnosis present

## 2021-07-08 DIAGNOSIS — Z20822 Contact with and (suspected) exposure to covid-19: Secondary | ICD-10-CM | POA: Diagnosis present

## 2021-07-08 DIAGNOSIS — M25571 Pain in right ankle and joints of right foot: Secondary | ICD-10-CM | POA: Diagnosis not present

## 2021-07-08 DIAGNOSIS — K224 Dyskinesia of esophagus: Secondary | ICD-10-CM | POA: Diagnosis not present

## 2021-07-08 DIAGNOSIS — R931 Abnormal findings on diagnostic imaging of heart and coronary circulation: Secondary | ICD-10-CM | POA: Diagnosis not present

## 2021-07-08 DIAGNOSIS — S82831D Other fracture of upper and lower end of right fibula, subsequent encounter for closed fracture with routine healing: Secondary | ICD-10-CM | POA: Diagnosis not present

## 2021-07-08 DIAGNOSIS — I77810 Thoracic aortic ectasia: Secondary | ICD-10-CM | POA: Diagnosis not present

## 2021-07-08 DIAGNOSIS — Z8249 Family history of ischemic heart disease and other diseases of the circulatory system: Secondary | ICD-10-CM | POA: Diagnosis not present

## 2021-07-08 DIAGNOSIS — S2241XD Multiple fractures of ribs, right side, subsequent encounter for fracture with routine healing: Secondary | ICD-10-CM | POA: Diagnosis not present

## 2021-07-08 DIAGNOSIS — S89301A Unspecified physeal fracture of lower end of right fibula, initial encounter for closed fracture: Secondary | ICD-10-CM | POA: Diagnosis not present

## 2021-07-08 DIAGNOSIS — R0602 Shortness of breath: Secondary | ICD-10-CM | POA: Diagnosis not present

## 2021-07-08 DIAGNOSIS — R600 Localized edema: Secondary | ICD-10-CM | POA: Diagnosis not present

## 2021-07-08 LAB — I-STAT ARTERIAL BLOOD GAS, ED
Acid-base deficit: 5 mmol/L — ABNORMAL HIGH (ref 0.0–2.0)
Bicarbonate: 15.6 mmol/L — ABNORMAL LOW (ref 20.0–28.0)
Calcium, Ion: 1.22 mmol/L (ref 1.15–1.40)
HCT: 48 % — ABNORMAL HIGH (ref 36.0–46.0)
Hemoglobin: 16.3 g/dL — ABNORMAL HIGH (ref 12.0–15.0)
O2 Saturation: 77 %
Patient temperature: 98.6
Potassium: 3.3 mmol/L — ABNORMAL LOW (ref 3.5–5.1)
Sodium: 131 mmol/L — ABNORMAL LOW (ref 135–145)
TCO2: 16 mmol/L — ABNORMAL LOW (ref 22–32)
pCO2 arterial: 20.8 mmHg — ABNORMAL LOW (ref 32–48)
pH, Arterial: 7.483 — ABNORMAL HIGH (ref 7.35–7.45)
pO2, Arterial: 37 mmHg — CL (ref 83–108)

## 2021-07-08 LAB — CBC WITH DIFFERENTIAL/PLATELET
Abs Immature Granulocytes: 0.18 10*3/uL — ABNORMAL HIGH (ref 0.00–0.07)
Basophils Absolute: 0 10*3/uL (ref 0.0–0.1)
Basophils Relative: 0 %
Eosinophils Absolute: 0 10*3/uL (ref 0.0–0.5)
Eosinophils Relative: 0 %
HCT: 48.3 % — ABNORMAL HIGH (ref 36.0–46.0)
Hemoglobin: 16.9 g/dL — ABNORMAL HIGH (ref 12.0–15.0)
Immature Granulocytes: 2 %
Lymphocytes Relative: 17 %
Lymphs Abs: 1.9 10*3/uL (ref 0.7–4.0)
MCH: 31.9 pg (ref 26.0–34.0)
MCHC: 35 g/dL (ref 30.0–36.0)
MCV: 91.1 fL (ref 80.0–100.0)
Monocytes Absolute: 2 10*3/uL — ABNORMAL HIGH (ref 0.1–1.0)
Monocytes Relative: 19 %
Neutro Abs: 6.6 10*3/uL (ref 1.7–7.7)
Neutrophils Relative %: 62 %
Platelets: 278 10*3/uL (ref 150–400)
RBC: 5.3 MIL/uL — ABNORMAL HIGH (ref 3.87–5.11)
RDW: 13.3 % (ref 11.5–15.5)
WBC: 10.7 10*3/uL — ABNORMAL HIGH (ref 4.0–10.5)
nRBC: 0 % (ref 0.0–0.2)

## 2021-07-08 LAB — COMPREHENSIVE METABOLIC PANEL
ALT: 22 U/L (ref 0–44)
AST: 25 U/L (ref 15–41)
Albumin: 3.6 g/dL (ref 3.5–5.0)
Alkaline Phosphatase: 70 U/L (ref 38–126)
Anion gap: 16 — ABNORMAL HIGH (ref 5–15)
BUN: 16 mg/dL (ref 8–23)
CO2: 20 mmol/L — ABNORMAL LOW (ref 22–32)
Calcium: 9.8 mg/dL (ref 8.9–10.3)
Chloride: 98 mmol/L (ref 98–111)
Creatinine, Ser: 1.03 mg/dL — ABNORMAL HIGH (ref 0.44–1.00)
GFR, Estimated: 56 mL/min — ABNORMAL LOW (ref >60)
Glucose, Bld: 137 mg/dL — ABNORMAL HIGH (ref 70–99)
Potassium: 3.3 mmol/L — ABNORMAL LOW (ref 3.5–5.1)
Sodium: 134 mmol/L — ABNORMAL LOW (ref 135–145)
Total Bilirubin: 2.1 mg/dL — ABNORMAL HIGH (ref 0.3–1.2)
Total Protein: 7.3 g/dL (ref 6.5–8.1)

## 2021-07-08 LAB — PROTIME-INR
INR: 1.1 (ref 0.8–1.2)
Prothrombin Time: 14.1 seconds (ref 11.4–15.2)

## 2021-07-08 LAB — COOXEMETRY PANEL
Carboxyhemoglobin: 1.8 % — ABNORMAL HIGH (ref 0.5–1.5)
Methemoglobin: 1.2 % (ref 0.0–1.5)
O2 Saturation: 51.6 %
Total hemoglobin: 16.1 g/dL — ABNORMAL HIGH (ref 12.0–16.0)

## 2021-07-08 LAB — I-STAT CHEM 8, ED
BUN: 17 mg/dL (ref 8–23)
Calcium, Ion: 1.15 mmol/L (ref 1.15–1.40)
Chloride: 99 mmol/L (ref 98–111)
Creatinine, Ser: 0.7 mg/dL (ref 0.44–1.00)
Glucose, Bld: 133 mg/dL — ABNORMAL HIGH (ref 70–99)
HCT: 53 % — ABNORMAL HIGH (ref 36.0–46.0)
Hemoglobin: 18 g/dL — ABNORMAL HIGH (ref 12.0–15.0)
Potassium: 3.2 mmol/L — ABNORMAL LOW (ref 3.5–5.1)
Sodium: 133 mmol/L — ABNORMAL LOW (ref 135–145)
TCO2: 18 mmol/L — ABNORMAL LOW (ref 22–32)

## 2021-07-08 LAB — SARS CORONAVIRUS 2 BY RT PCR: SARS Coronavirus 2 by RT PCR: NEGATIVE

## 2021-07-08 LAB — LACTIC ACID, PLASMA
Lactic Acid, Venous: 2.3 mmol/L (ref 0.5–1.9)
Lactic Acid, Venous: 4.5 mmol/L (ref 0.5–1.9)
Lactic Acid, Venous: 4.2 mmol/L (ref 0.5–1.9)
Lactic Acid, Venous: 1.5 mmol/L (ref 0.5–1.9)

## 2021-07-08 LAB — TSH: TSH: 1.324 u[IU]/mL (ref 0.350–4.500)

## 2021-07-08 LAB — MAGNESIUM: Magnesium: 1.9 mg/dL (ref 1.7–2.4)

## 2021-07-08 LAB — TROPONIN I (HIGH SENSITIVITY)
Troponin I (High Sensitivity): 30 ng/L — ABNORMAL HIGH (ref <18)
Troponin I (High Sensitivity): 20 ng/L — ABNORMAL HIGH (ref <18)

## 2021-07-08 LAB — BRAIN NATRIURETIC PEPTIDE: B Natriuretic Peptide: 96.3 pg/mL (ref 0.0–100.0)

## 2021-07-08 IMAGING — US US ABDOMEN LIMITED
1 series · 14 of 25 positions shown · non-contrast
Comparison: [DATE]

CLINICAL DATA: Cirrhosis.

EXAM:
ULTRASOUND ABDOMEN LIMITED RIGHT UPPER QUADRANT

[Series 1: us abdomen limited ruq (liver/gb) · 14 of 45 slices shown]
[im 1/45]
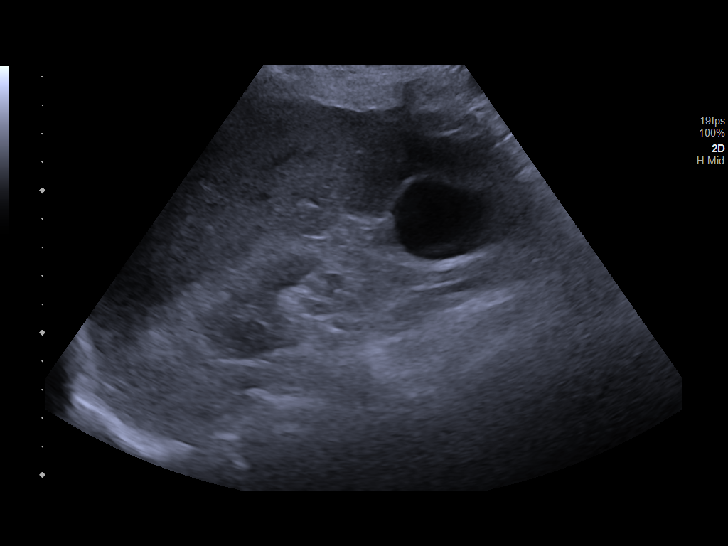
[im 4/45]
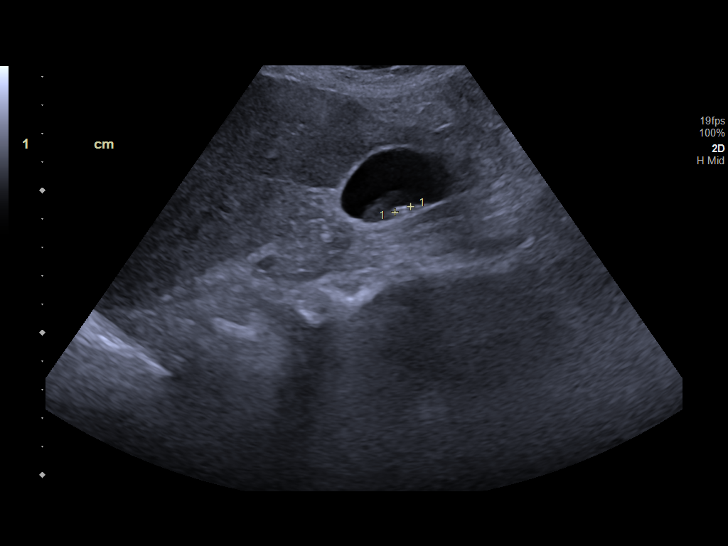
[im 8/45]
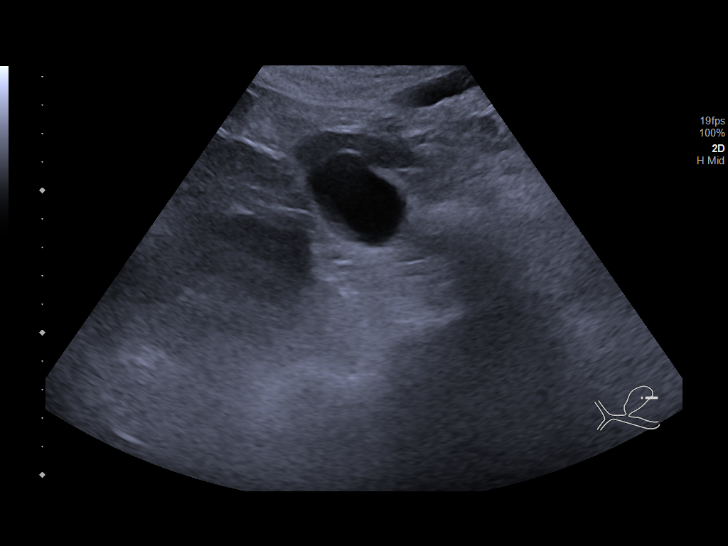
[im 12/45]
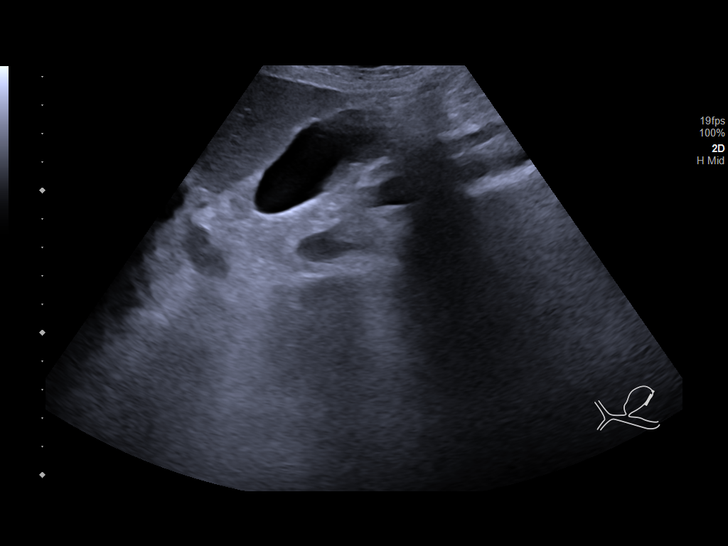
[im 15/45]
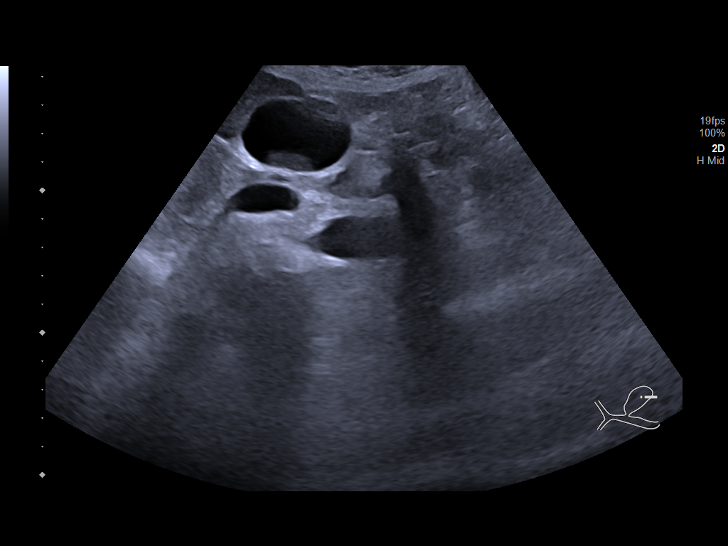
[im 17/45]
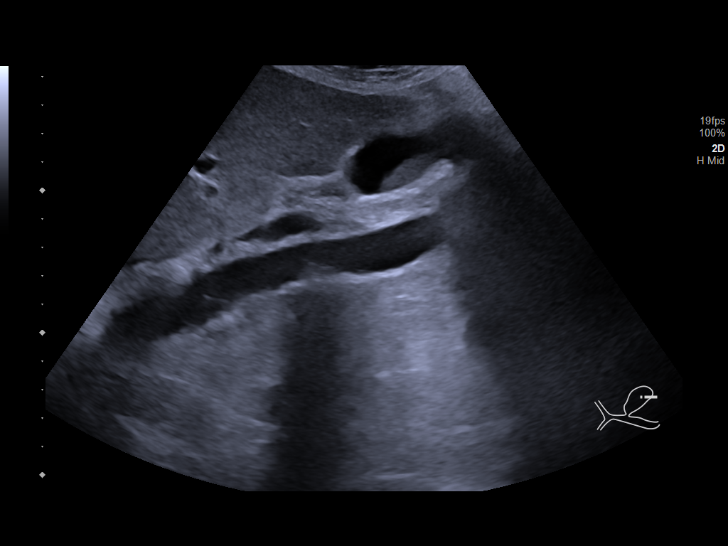
[im 21/45]
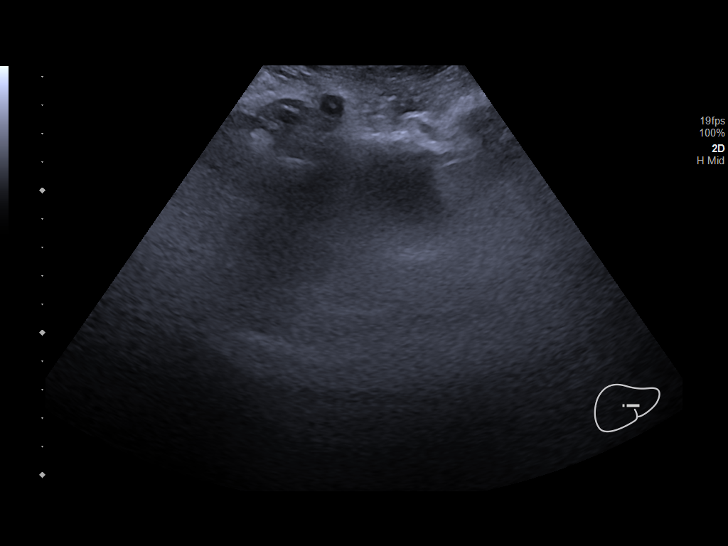
[im 24/45]
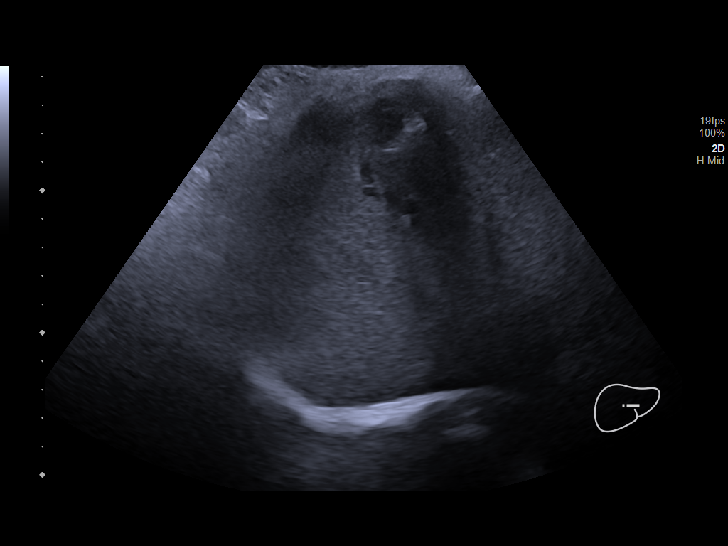
[im 28/45]
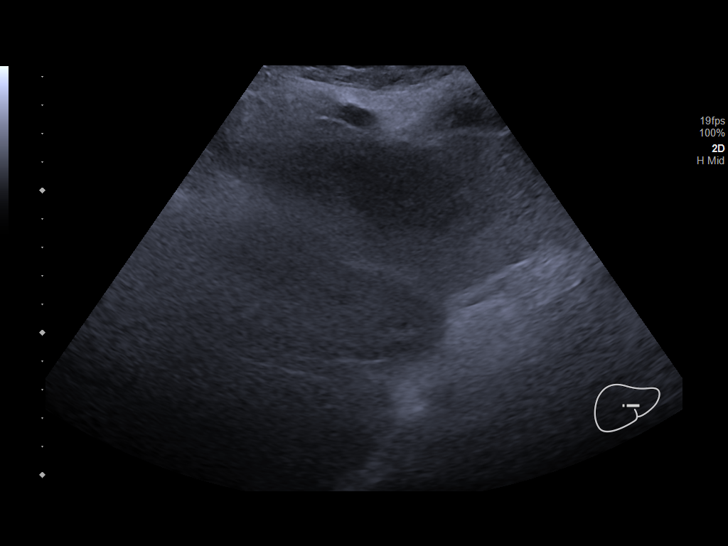
[im 30/45]
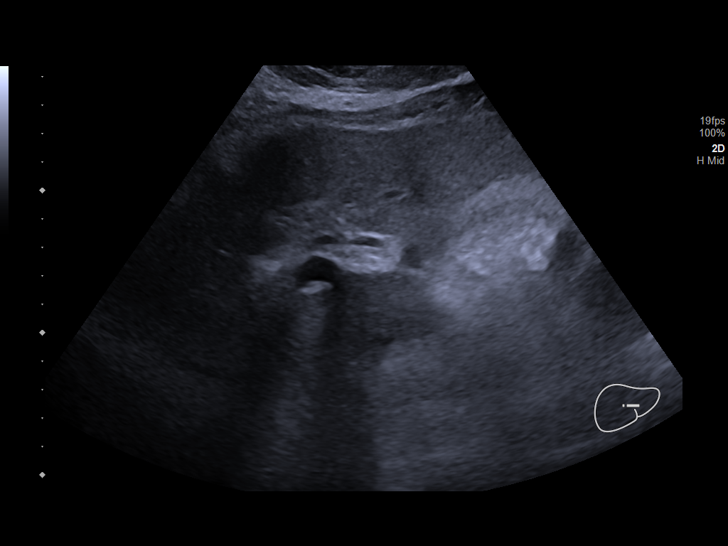
[im 34/45]
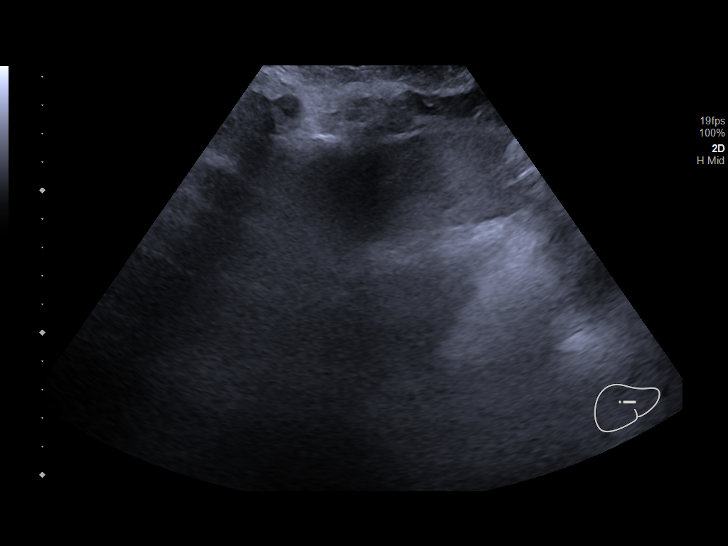
[im 37/45]
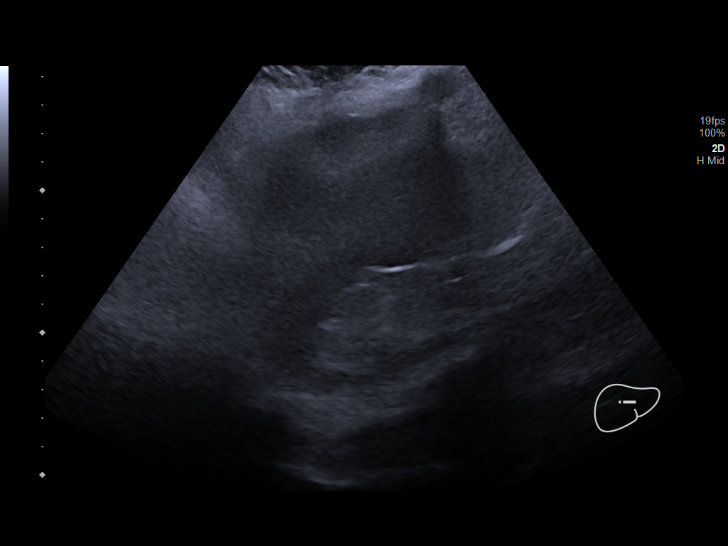
[im 41/45]
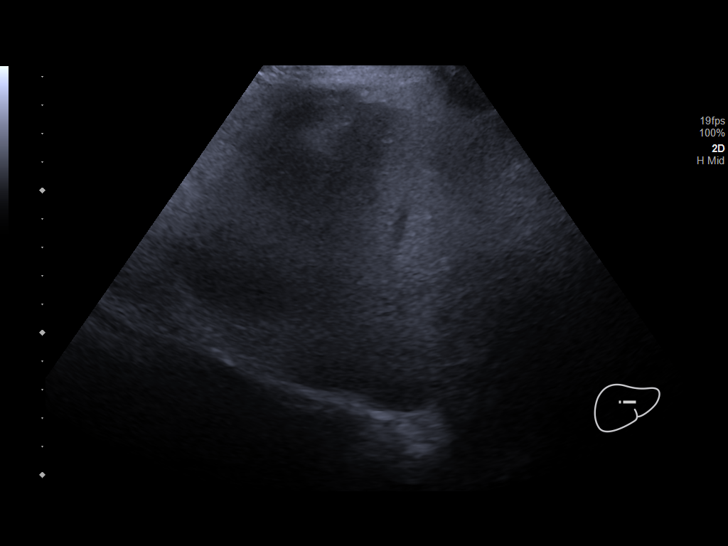
[im 45/45]
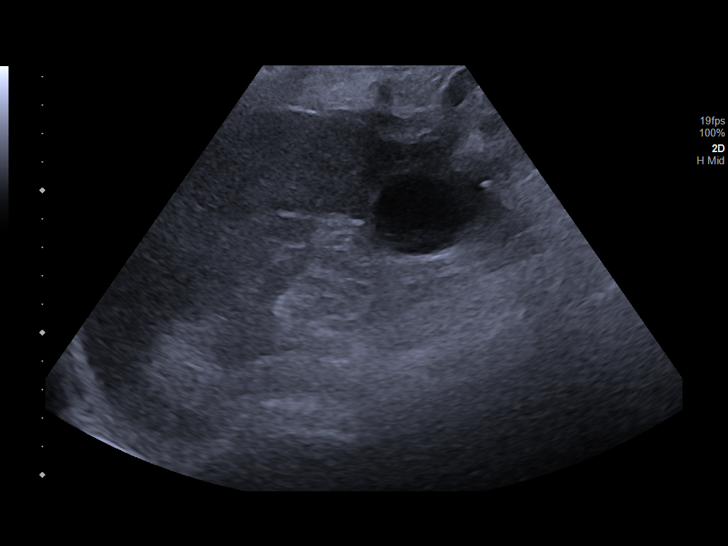

[14 of 25 positions shown; findings below may reference images not displayed]

FINDINGS: Gallbladder:

Single 6 mm stone with mild associated sludge. No gallbladder wall
thickening or adjacent free fluid. Negative sonographic Murphy sign.

Common bile duct:

Diameter: 2.5 mm.

Liver:

No focal lesion identified. Within normal limits in parenchymal
echogenicity. Portal vein is patent on color Doppler imaging with
normal direction of blood flow towards the liver.

Other: None.
IMPRESSION: Single 6 mm gallstone with associated gallbladder sludge. No
additional sonographic evidence to suggest acute cholecystitis.

## 2021-07-08 IMAGING — CT CT ANGIO CHEST
2 of 6 series · 18 of 36 positions shown · IV contrast (agent unspecified)
Comparison: CTA of the chest [DATE].

CLINICAL DATA: 80-year-old female with suspected pulmonary
embolism.

EXAM:
CT ANGIOGRAPHY CHEST WITH CONTRAST
TECHNIQUE: Multidetector CT imaging of the chest was performed using the
standard protocol during bolus administration of intravenous
contrast. Multiplanar CT image reconstructions and MIPs were
obtained to evaluate the vascular anatomy.

[Series 7: pe thins · axial · 0.73mm/px · z∈[+1311,+1578]mm · 17 of 301 slices shown]
[im 17/301  lung]
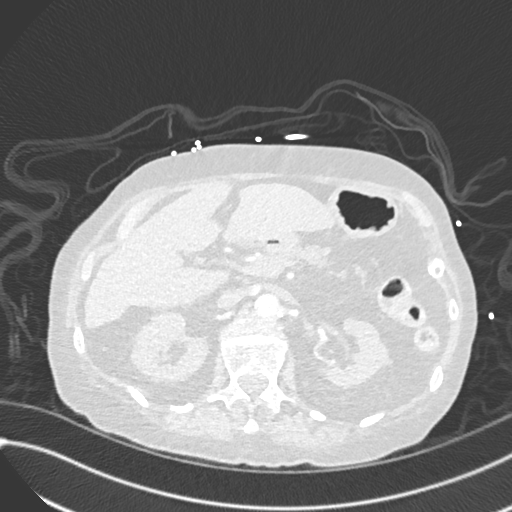
[im 34/301  mediastinal]
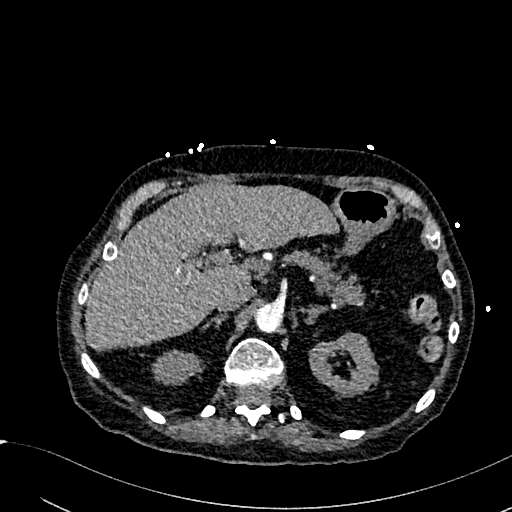
[im 51/301  lung]
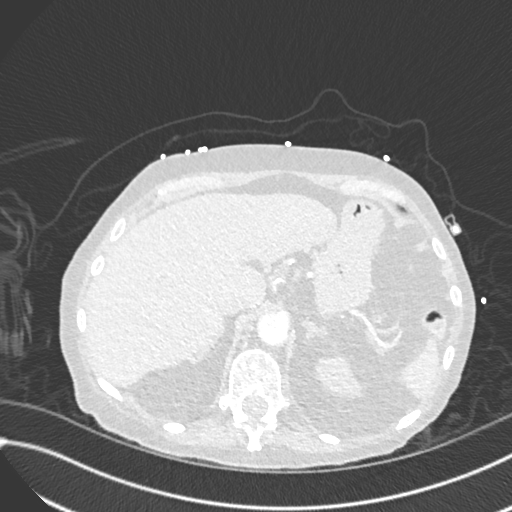
[im 67/301  mediastinal]
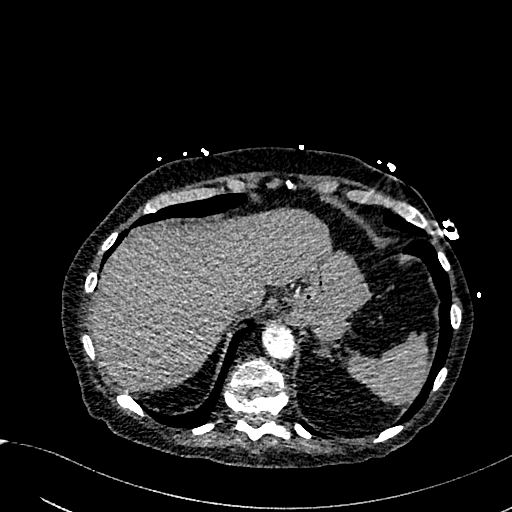
[im 84/301  lung]
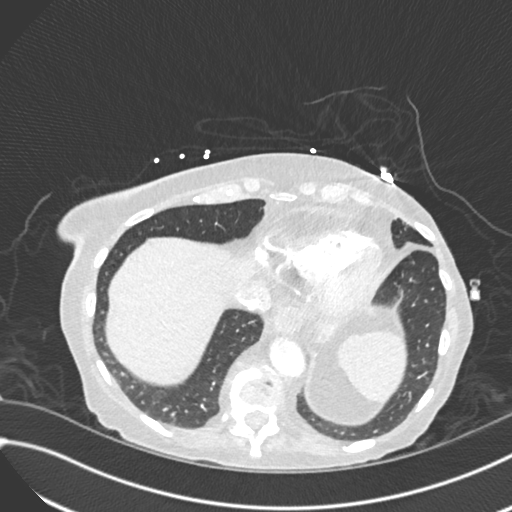
[im 101/301  mediastinal]
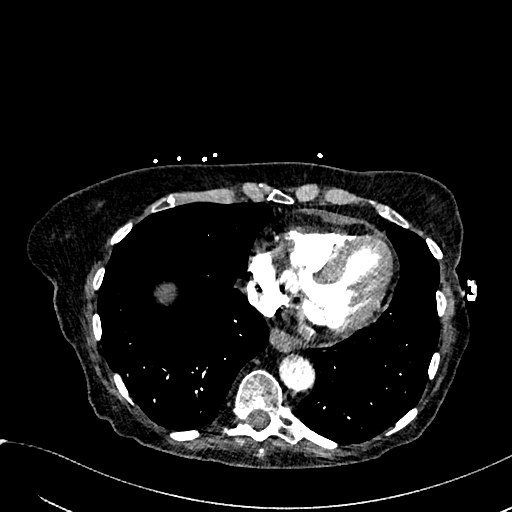
[im 117/301  lung]
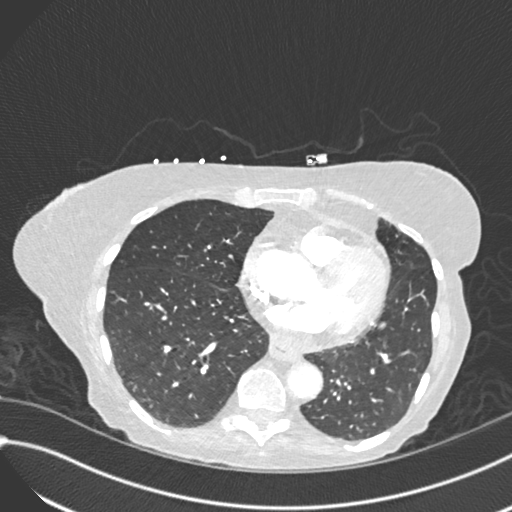
[im 134/301  mediastinal]
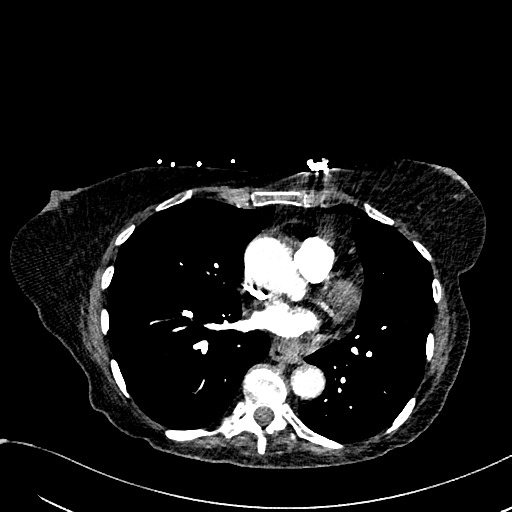
[im 151/301  lung]
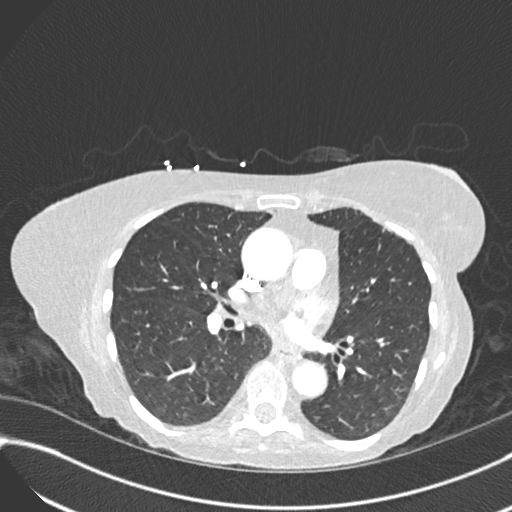
[im 167/301  mediastinal]
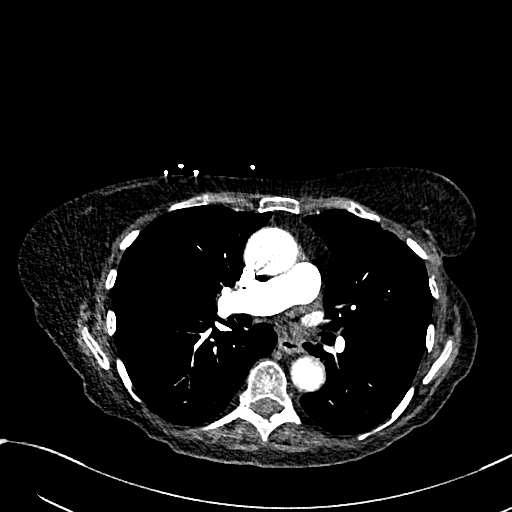
[im 184/301  lung]
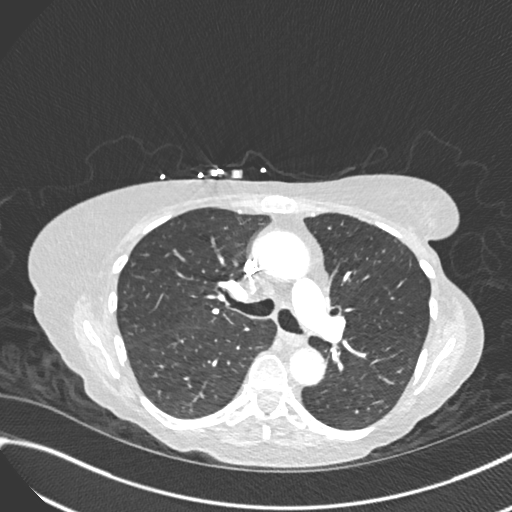
[im 201/301  mediastinal]
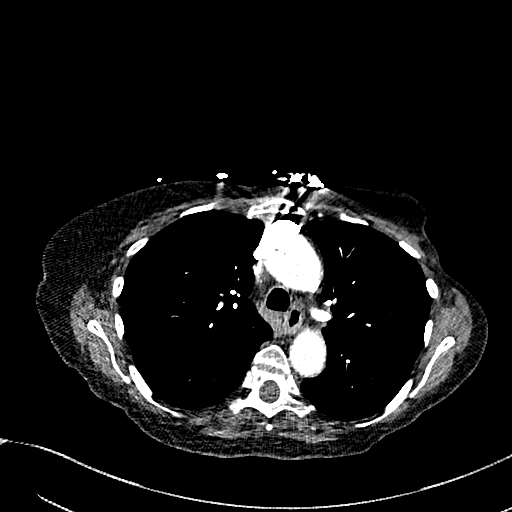
[im 217/301  lung]
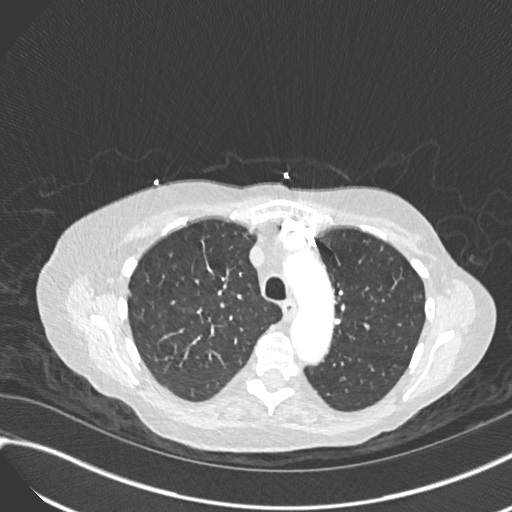
[im 234/301  mediastinal]
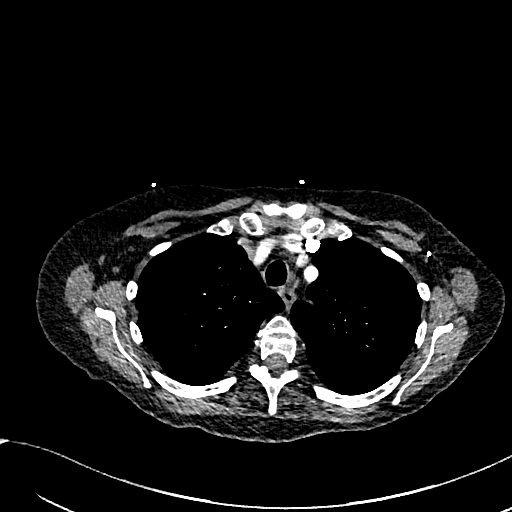
[im 251/301  lung]
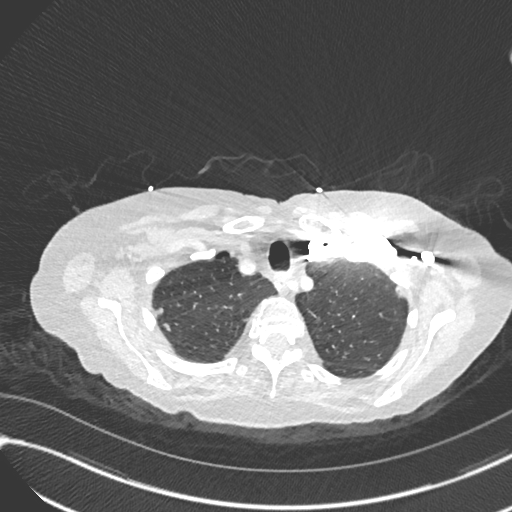
[im 267/301  mediastinal]
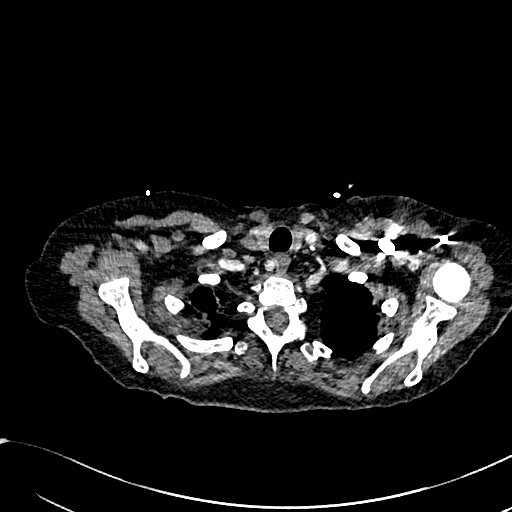
[im 284/301  lung]
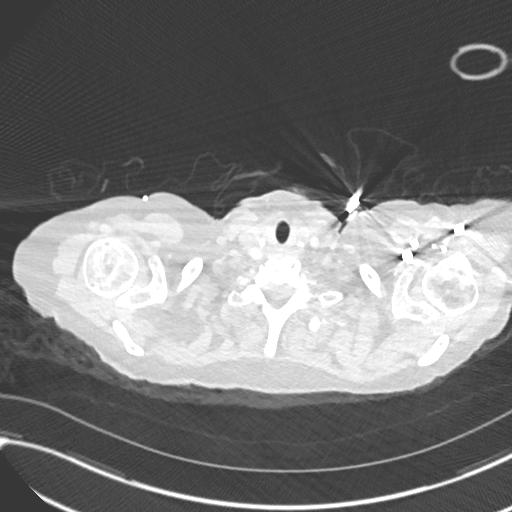

[Series 8: pe 2mm cor · coronal · 0.59mm/px · 1 of 150 slices shown]
[im 75/150  mediastinal]
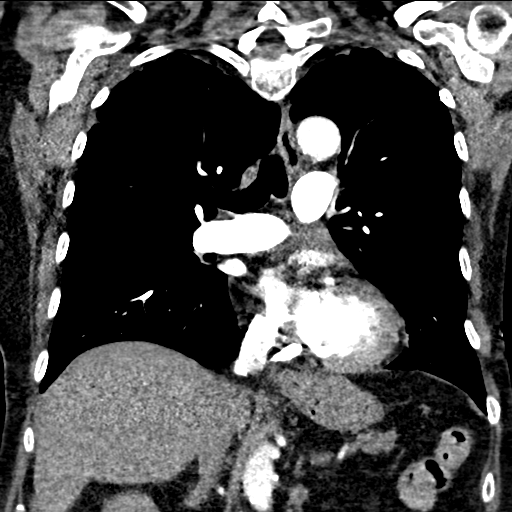

[18 of 36 positions shown; findings below may reference images not displayed]

RADIATION DOSE REDUCTION: This exam was performed according to the
departmental dose-optimization program which includes automated
exposure control, adjustment of the mA and/or kV according to
patient size and/or use of iterative reconstruction technique.

CONTRAST:  100mL OMNIPAQUE IOHEXOL 350 MG/ML SOLN
FINDINGS: Cardiovascular: No filling defects within the pulmonary arterial
tree to suggest pulmonary embolism. Heart size is normal. There is
no significant pericardial fluid, thickening or pericardial
calcification. Aortic atherosclerosis with ectasia of the ascending
thoracic aorta (3.9 cm in diameter). No definite coronary artery
calcifications.

Mediastinum/Nodes: No pathologically enlarged mediastinal or hilar
lymph nodes. Esophagus is unremarkable in appearance. No axillary
lymphadenopathy.

Lungs/Pleura: No acute consolidative airspace disease. No pleural
effusions. No definite suspicious appearing pulmonary nodules or
masses are noted.

Upper Abdomen: Aortic atherosclerosis.

Musculoskeletal: New compression fracture of T11 with 70% loss of
anterior vertebral body height. Fracture lines are evident and there
is a trace amount of paravertebral soft tissue swelling. Healing
fracture of the posterolateral aspect of the right ninth rib. Old
healed fracture of the posterolateral aspect of the right tenth rib.

Review of the MIP images confirms the above findings.
IMPRESSION: 1. No evidence of pulmonary embolism.
2. New, likely subacute compression fracture of T11 with 70% loss of
anterior vertebral body height.
3. Aortic atherosclerosis with ectasia of the ascending thoracic
aorta (3.9 cm in diameter.

Aortic Atherosclerosis ([3T]-[3T]).

## 2021-07-08 IMAGING — DX DG ANKLE COMPLETE 3+V*R*
1 series · 3 of 3 positions shown · non-contrast
Comparison: None Available.

CLINICAL DATA: Fall with lateral right ankle pain.

EXAM:
RIGHT ANKLE - COMPLETE 3+ VIEW

[Series 1: ankle · 0.14mm/px · 3 of 3 slices shown]
[im 1/3]
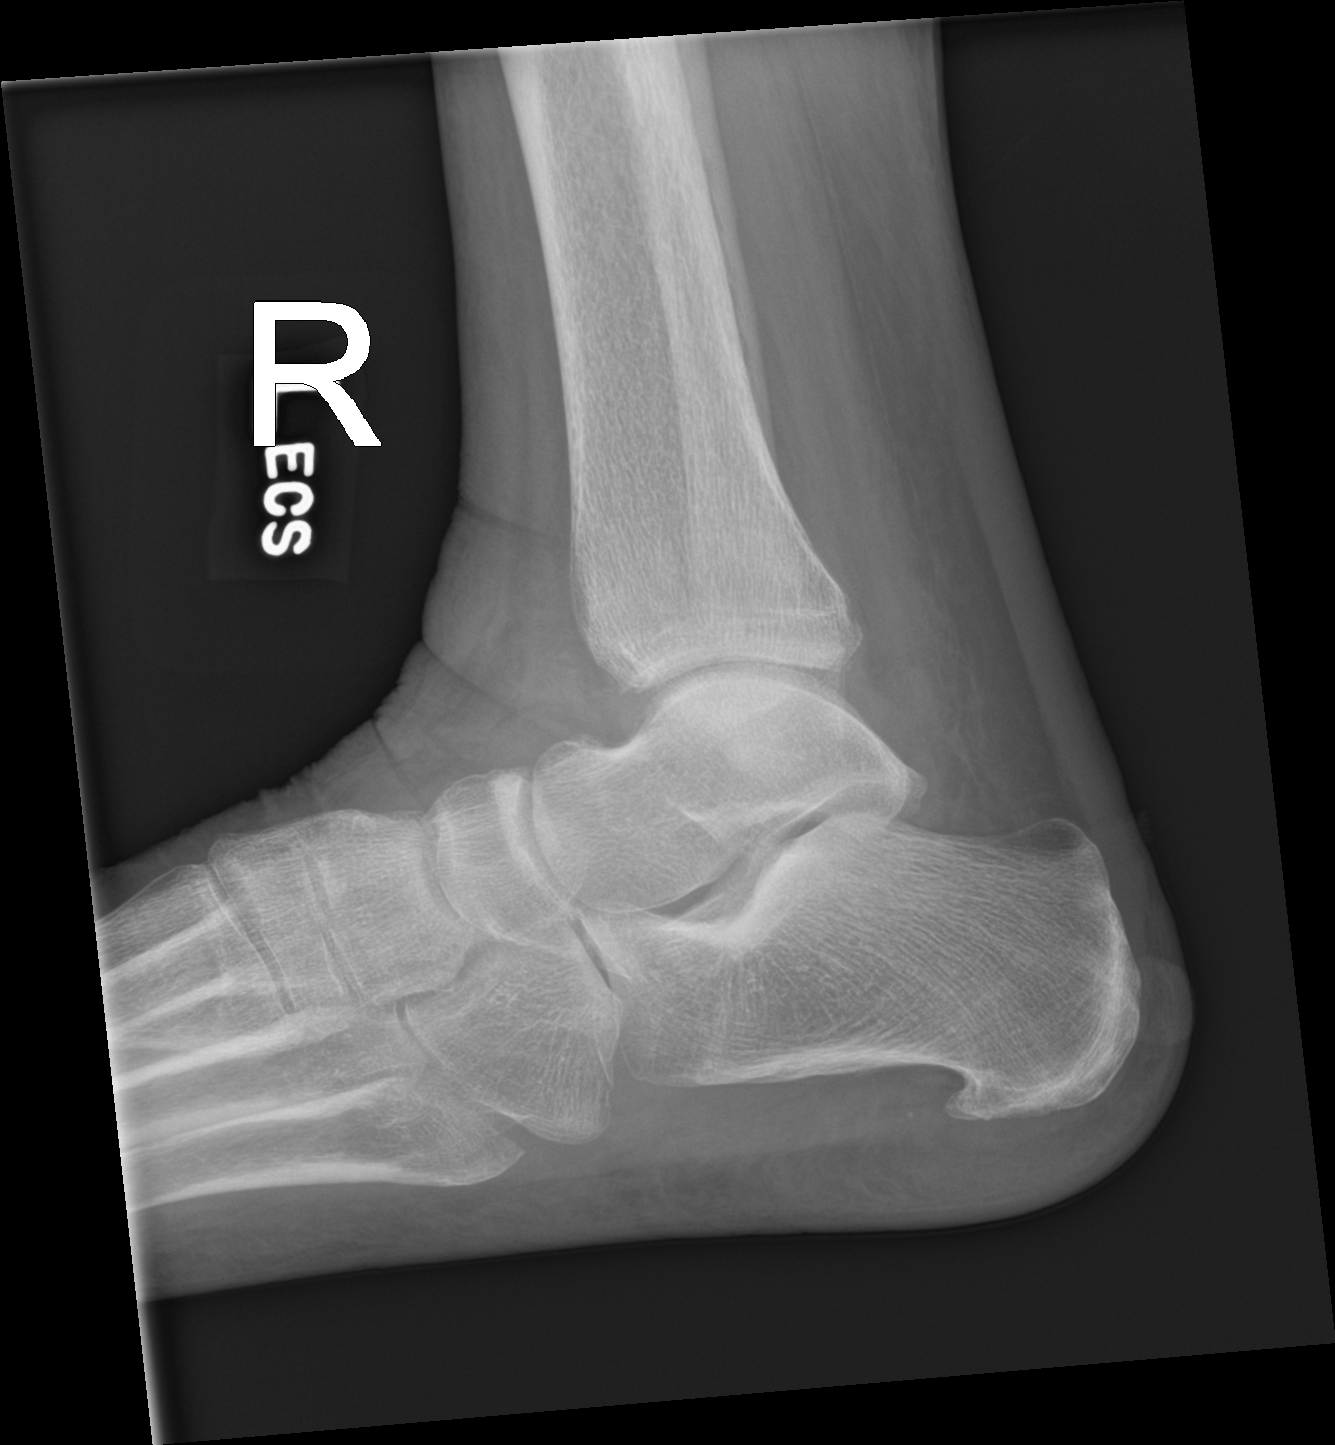
[im 2/3]
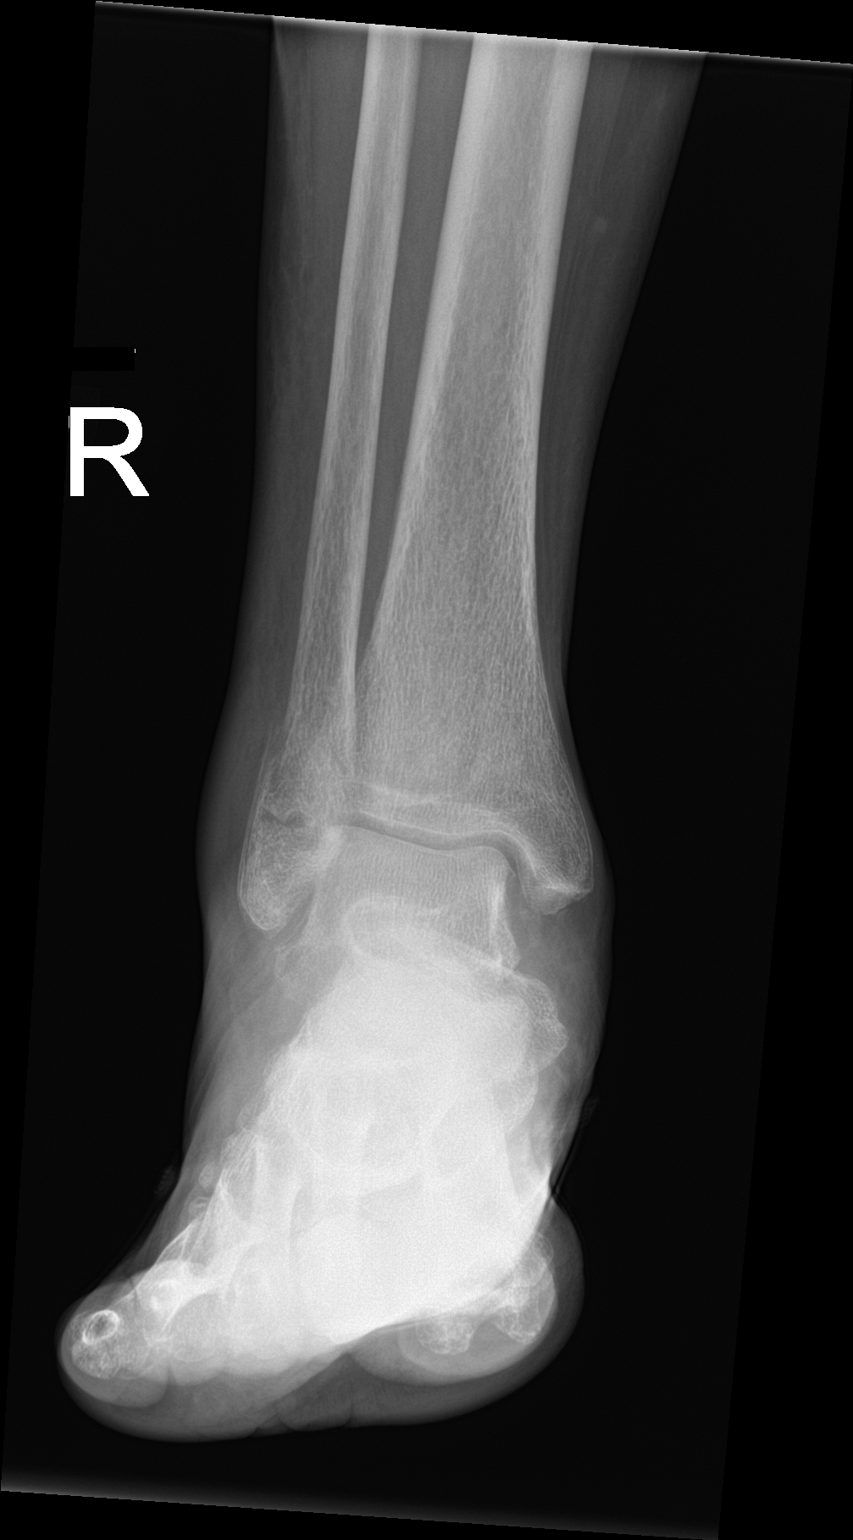
[im 3/3]
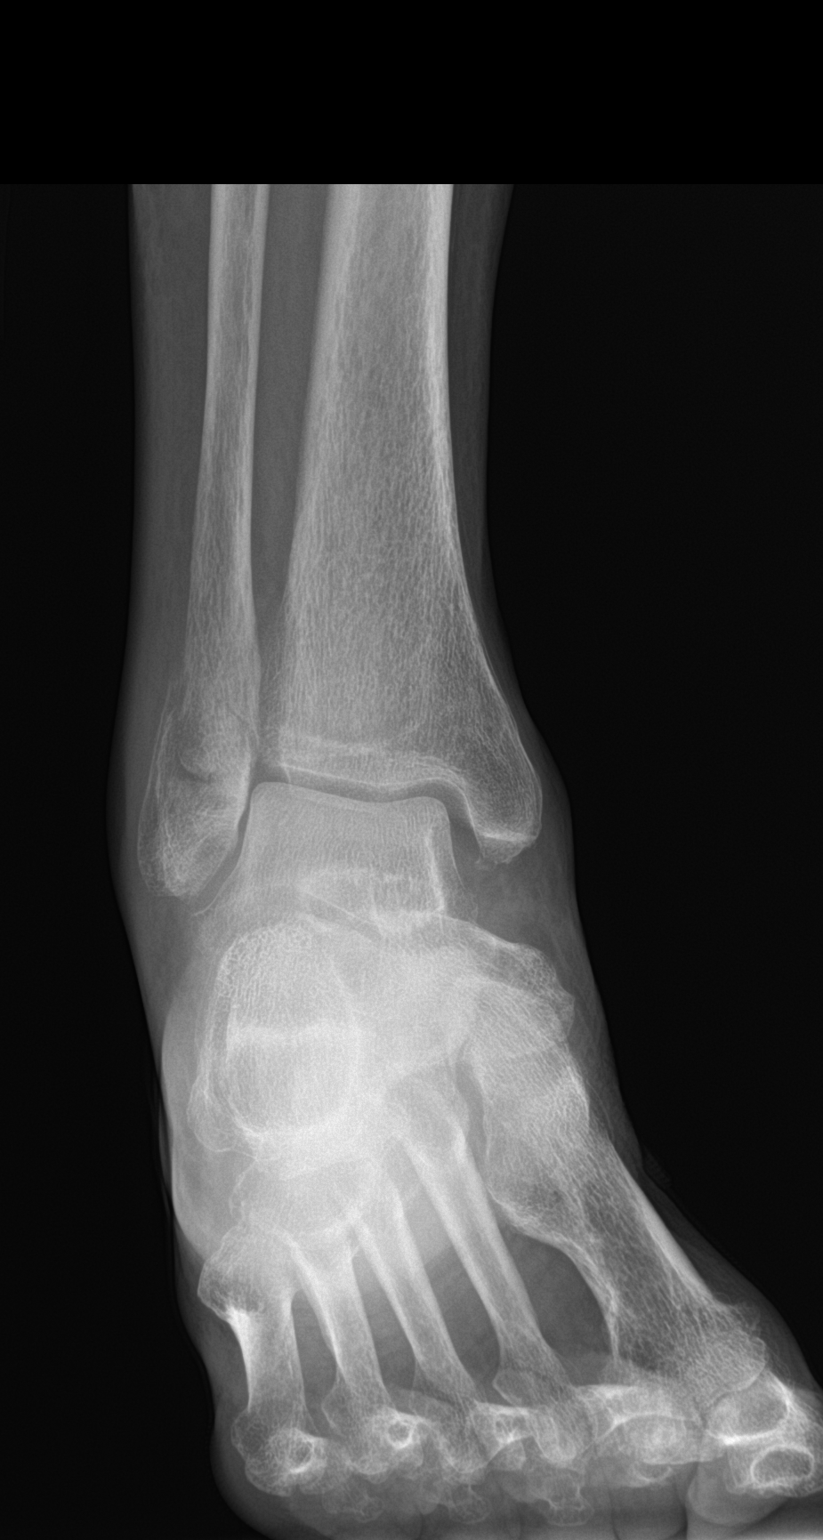

[3 of 3 positions shown; findings below may reference images not displayed]

FINDINGS: Exam demonstrates a minimally displaced oblique fracture of the
distal fibular metaphyseal region at the level of the ankle mortise.
Ankle mortise is otherwise within normal. Minimal degenerative
change of the hindfoot. Small inferior calcaneal spur.
IMPRESSION: Minimally displaced oblique fracture of the distal fibular
metaphysis.

## 2021-07-08 IMAGING — DX DG CHEST 1V PORT
1 series · 1 of 1 positions shown · non-contrast
Comparison: [DATE]

CLINICAL DATA: Hypoxia and shortness of breath since yesterday.
Fall yesterday.

EXAM:
PORTABLE CHEST 1 VIEW

[chest]
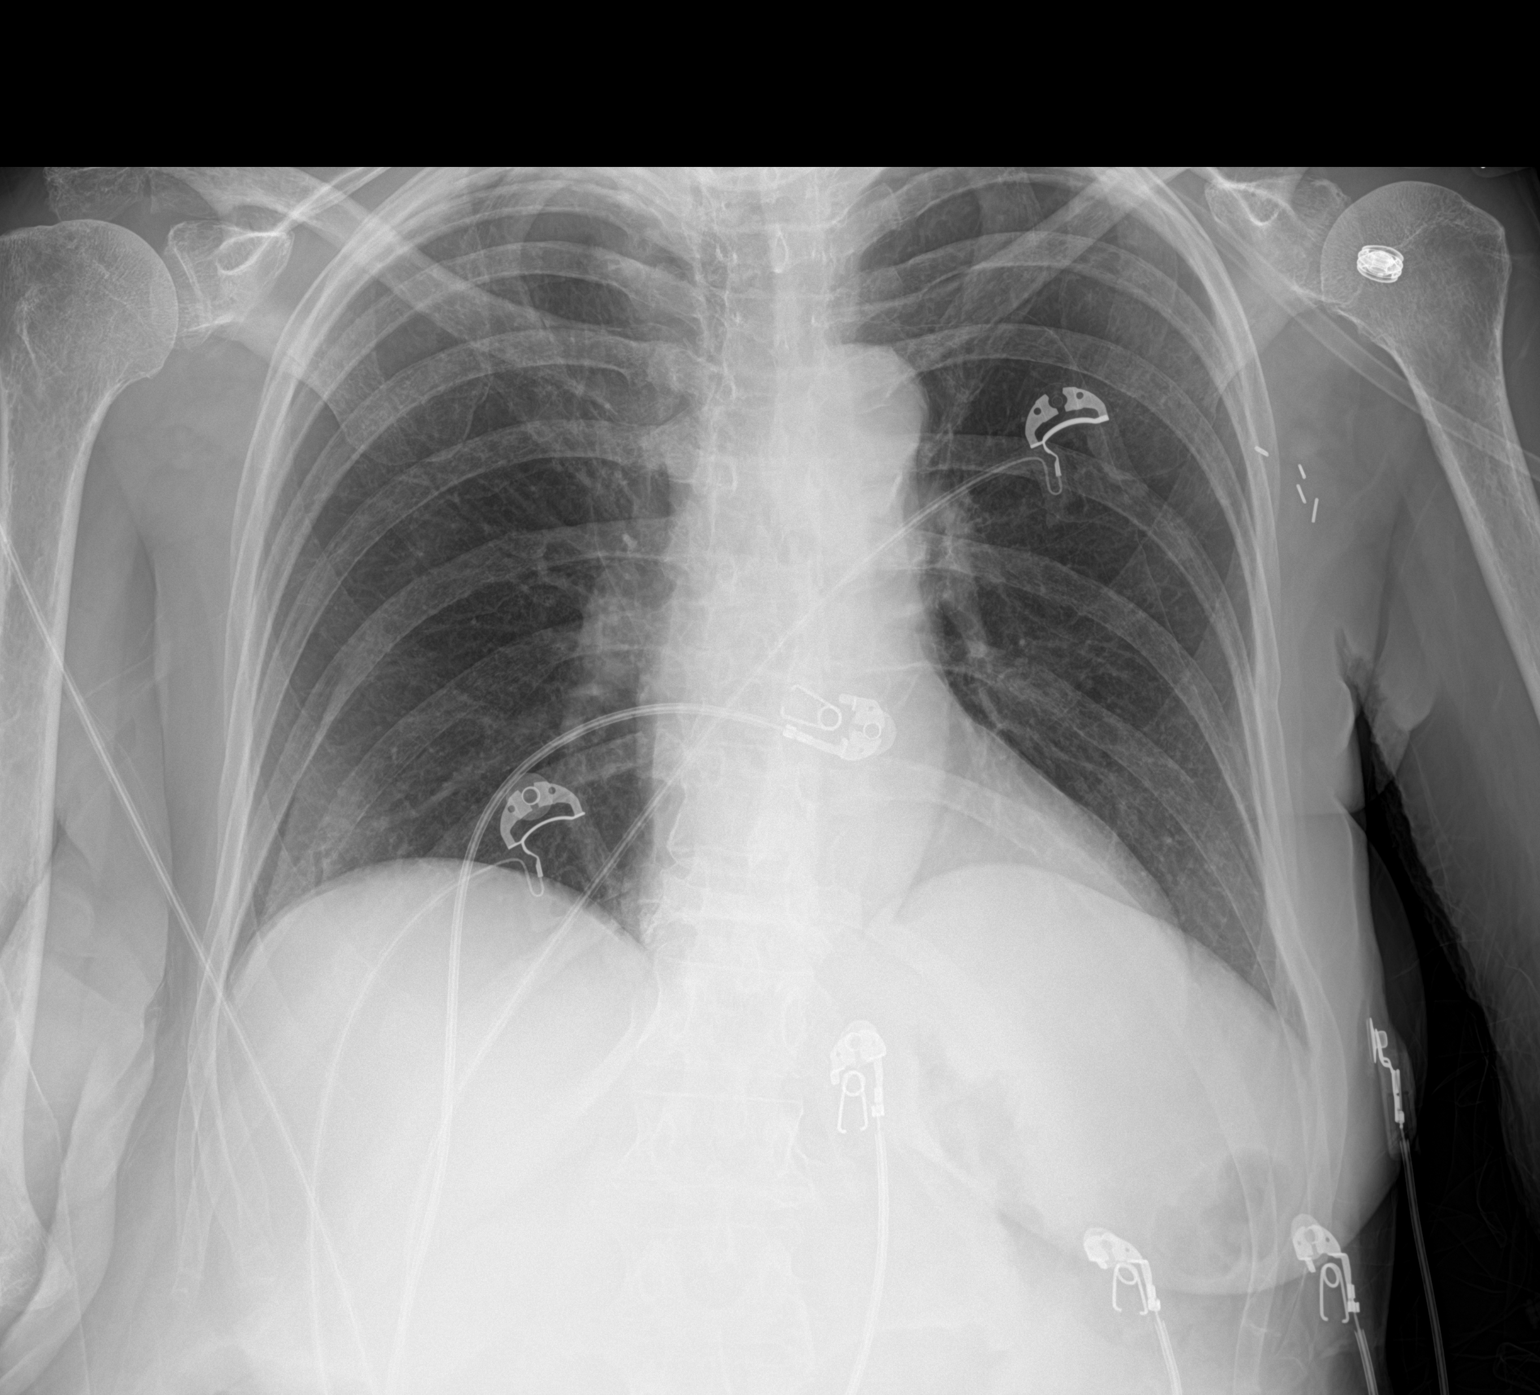

[1 of 1 positions shown; findings below may reference images not displayed]

FINDINGS: Lungs are adequately inflated and otherwise clear. Cardiomediastinal
silhouette is normal. Remainder of the exam is unremarkable.
IMPRESSION: No active disease.

## 2021-07-08 MED ORDER — METOPROLOL TARTRATE 25 MG PO TABS
25.0000 mg | ORAL_TABLET | Freq: Two times a day (BID) | ORAL | Status: DC
  Administered 2021-07-08 – 2021-07-15 (×11): 25 mg via ORAL
  Filled 2021-07-08 (×13): qty 1

## 2021-07-08 MED ORDER — PANTOPRAZOLE SODIUM 40 MG PO TBEC
40.0000 mg | DELAYED_RELEASE_TABLET | Freq: Every day | ORAL | Status: DC
  Administered 2021-07-08 – 2021-07-15 (×6): 40 mg via ORAL
  Filled 2021-07-08 (×7): qty 1

## 2021-07-08 MED ORDER — LORAZEPAM 2 MG/ML IJ SOLN
1.0000 mg | INTRAMUSCULAR | Status: AC | PRN
  Administered 2021-07-09: 1 mg via INTRAVENOUS
  Filled 2021-07-08: qty 1

## 2021-07-08 MED ORDER — METHYLPREDNISOLONE SODIUM SUCC 125 MG IJ SOLR
125.0000 mg | Freq: Once | INTRAMUSCULAR | Status: AC
  Administered 2021-07-08: 125 mg via INTRAVENOUS
  Filled 2021-07-08: qty 2

## 2021-07-08 MED ORDER — ESCITALOPRAM OXALATE 10 MG PO TABS
20.0000 mg | ORAL_TABLET | Freq: Every day | ORAL | Status: DC
  Administered 2021-07-08 – 2021-07-15 (×6): 20 mg via ORAL
  Filled 2021-07-08: qty 2
  Filled 2021-07-08: qty 1
  Filled 2021-07-08 (×2): qty 2
  Filled 2021-07-08: qty 1
  Filled 2021-07-08 (×2): qty 2
  Filled 2021-07-08: qty 1

## 2021-07-08 MED ORDER — ALBUTEROL SULFATE (2.5 MG/3ML) 0.083% IN NEBU
5.0000 mg | INHALATION_SOLUTION | Freq: Once | RESPIRATORY_TRACT | Status: AC
  Administered 2021-07-08: 5 mg via RESPIRATORY_TRACT
  Filled 2021-07-08: qty 6

## 2021-07-08 MED ORDER — ADULT MULTIVITAMIN W/MINERALS CH
1.0000 | ORAL_TABLET | Freq: Every day | ORAL | Status: DC
  Administered 2021-07-08 – 2021-07-15 (×6): 1 via ORAL
  Filled 2021-07-08 (×7): qty 1

## 2021-07-08 MED ORDER — THIAMINE HCL 100 MG PO TABS
100.0000 mg | ORAL_TABLET | Freq: Every day | ORAL | Status: DC
  Administered 2021-07-08: 100 mg via ORAL
  Filled 2021-07-08: qty 1

## 2021-07-08 MED ORDER — LORAZEPAM 1 MG PO TABS: 1.0000 mg | ORAL_TABLET | ORAL | Status: AC | PRN

## 2021-07-08 MED ORDER — POTASSIUM CHLORIDE CRYS ER 20 MEQ PO TBCR
40.0000 meq | EXTENDED_RELEASE_TABLET | Freq: Once | ORAL | Status: AC
  Administered 2021-07-08: 40 meq via ORAL
  Filled 2021-07-08: qty 2

## 2021-07-08 MED ORDER — ASPIRIN 81 MG PO TBEC
81.0000 mg | DELAYED_RELEASE_TABLET | Freq: Every day | ORAL | Status: DC
  Administered 2021-07-08 – 2021-07-15 (×6): 81 mg via ORAL
  Filled 2021-07-08 (×7): qty 1

## 2021-07-08 MED ORDER — IPRATROPIUM BROMIDE 0.02 % IN SOLN
0.5000 mg | Freq: Once | RESPIRATORY_TRACT | Status: AC
  Administered 2021-07-08: 0.5 mg via RESPIRATORY_TRACT
  Filled 2021-07-08: qty 2.5

## 2021-07-08 MED ORDER — LACTATED RINGERS IV BOLUS
1000.0000 mL | Freq: Once | INTRAVENOUS | Status: AC
  Administered 2021-07-08: 1000 mL via INTRAVENOUS

## 2021-07-08 MED ORDER — IOHEXOL 350 MG/ML SOLN
100.0000 mL | Freq: Once | INTRAVENOUS | Status: AC | PRN
  Administered 2021-07-08: 100 mL via INTRAVENOUS

## 2021-07-08 MED ORDER — STERILE WATER FOR INJECTION IJ SOLN
INTRAMUSCULAR | Status: AC
  Administered 2021-07-08: 2 mL
  Filled 2021-07-08: qty 10

## 2021-07-08 MED ORDER — FOLIC ACID 1 MG PO TABS
1.0000 mg | ORAL_TABLET | Freq: Every day | ORAL | Status: DC
  Administered 2021-07-08 – 2021-07-15 (×6): 1 mg via ORAL
  Filled 2021-07-08 (×7): qty 1

## 2021-07-08 MED ORDER — ENOXAPARIN SODIUM 40 MG/0.4ML IJ SOSY
40.0000 mg | PREFILLED_SYRINGE | INTRAMUSCULAR | Status: DC
  Administered 2021-07-08 – 2021-07-15 (×7): 40 mg via SUBCUTANEOUS
  Filled 2021-07-08 (×6): qty 0.4

## 2021-07-08 MED ORDER — OLANZAPINE 5 MG PO TABS
7.5000 mg | ORAL_TABLET | Freq: Every day | ORAL | Status: DC
  Administered 2021-07-08: 7.5 mg via ORAL
  Filled 2021-07-08: qty 1
  Filled 2021-07-08: qty 2

## 2021-07-08 NOTE — Consult Note (Signed)
NAME:  Monique Ballard, MRN:  [id number], DOB:  August 01, 1940, LOS: 0 ADMISSION DATE:  07/08/2021, CONSULTATION DATE:  6/18 REFERRING MD:  Alvino Chapel, CHIEF COMPLAINT:  unexplained acute hypoxia    History of Present Illness:  32 yof presented to ER 6/18 w/ cc: about 1 wk h/o SOB. No cough, CP. No sick exposure. On arrival to ER sats 70% on room air. CT chest negative for PE or parenchymal disease. Place on Supplemental oxygen. ER  worsening hypoxia when in upright position and improved when in supine position.   Pertinent  Medical History  Breast cancer, w/ prior left lumpectomy & XRT 1997 (released from [place]) Tremor ETOH Prior stroke 2021 Fatty liver Depression Endometriosis  Gerd HTN Esophageal stricture  Incidental episode of hypoxia back in 2021; seen by [facility] at that time no PE, fibrosis or emphysema. ECHO bubble study ordered to eval for shunt but not done. (At that point no Platypnea or orthodeoxia) back in 2021 he was concerned about hepatopulmonary syndrome.  Sats 88% then in the office  Significant Hospital Events: Including procedures, antibiotic start and stop dates in addition to other pertinent events   6/18 admitted for 24hr h/o hypoxia. CT negative for PE and Parenchymal disease.   Interim History / Subjective:  Feels better w/oxygen  Objective   Blood pressure 119/80, pulse (Abnormal) 110, temperature 98.2 F (36.8 C), resp. rate 19, last menstrual period 01/22/1992, SpO2 95 %.    FiO2 (%):  [100 %] 100 %   Intake/Output Summary (Last 24 hours) at 07/08/2021 1155 Last data filed at 07/08/2021 1155 Gross per 24 hour  Intake 1000 ml  Output no documentation  Net 1000 ml   There were no vitals filed for this visit.  Examination: General: 32 yof lying supine at 15 degrees in bed on NRB. Currently in NAD HENT: NNCAT no JVD  Lungs: clear, no accessory use  Cardiovascular: no MRG Abdomen: soft + telangiectasia on abd not tender  Extremities: no sig edema  pulses +  Neuro: awake, oriented  GU: due to void   Resolved Hospital Problem list     Assessment & Plan:  Acute hypoxic respiratory failure.  -etiology not entirely clear as of yet w/ current workup thus far primary pulm parenchymal or vascular pathology seems unlikely but did smoke and we never did get PFTs so air flow disease possible. Also consider intracardiac or acquired noncardiac shunt and hepatopulmonary syndrome.  Plan Cont supplemental oxygen  Pulse ox  ECHO w/ delayed bubble study to r/o shunt RUQ [place] to eval liver Eventually needs PFTs.     Best Practice (right click and "Reselect all SmartList Selections" daily)   Per primary   Labs   CBC: Recent Labs  Lab 07/08/21 0932 07/08/21 0949 07/08/21 1000  WBC 10.7*  --   --   NEUTROABS 6.6  --   --   HGB 16.9* 16.3* 18.0*  HCT 48.3* 48.0* 53.0*  MCV 91.1  --   --   PLT 278  --   --     Basic Metabolic Panel: Recent Labs  Lab 07/08/21 0932 07/08/21 0949 07/08/21 1000  NA 134* 131* 133*  K 3.3* 3.3* 3.2*  CL 98  --  99  CO2 20*  --   --   GLUCOSE 137*  --  133*  BUN 16  --  17  CREATININE 1.03*  --  0.70  CALCIUM 9.8  --   --    GFR: CrCl cannot  be calculated (Unknown ideal weight.). Recent Labs  Lab 07/08/21 0932  WBC 10.7*  LATICACIDVEN 2.3*    Liver Function Tests: Recent Labs  Lab 07/08/21 0932  AST 25  ALT 22  ALKPHOS 70  BILITOT 2.1*  PROT 7.3  ALBUMIN 3.6   No results for input(s): "LIPASE", "AMYLASE" in the last 168 hours. No results for input(s): "AMMONIA" in the last 168 hours.  ABG    Component Value Date/Time   PHART 7.483 (H) 07/08/2021 0949   PCO2ART 20.8 (L) 07/08/2021 0949   PO2ART 37 (LL) 07/08/2021 0949   HCO3 15.6 (L) 07/08/2021 0949   TCO2 18 (L) 07/08/2021 1000   ACIDBASEDEF 5.0 (H) 07/08/2021 0949   O2SAT 51.6 07/08/2021 1005     Coagulation Profile: Recent Labs  Lab 07/08/21 0932  INR 1.1    Cardiac Enzymes: No results for input(s): "CKTOTAL",  "CKMB", "CKMBINDEX", "TROPONINI" in the last 168 hours.  HbA1C: Hgb A1c MFr Bld  Date/Time Value Ref Range Status  03/03/2020 02:53 PM 4.1 <5.7 % of total Hgb Final    Comment:    For the purpose of screening for the presence of diabetes: . <5.7%       Consistent with the absence of diabetes 5.7-6.4%    Consistent with increased risk for diabetes             (prediabetes) > or =6.5%  Consistent with diabetes . This assay result is consistent with a decreased risk of diabetes. . Currently, no consensus exists regarding use of hemoglobin A1c for diagnosis of diabetes in children. . According to American Diabetes Association (ADA) guidelines, hemoglobin A1c <7.0% represents optimal control in non-pregnant diabetic patients. Different metrics may apply to specific patient populations.  Standards of Medical Care in Diabetes(ADA). Marland Kitchen   02/06/2016 02:26 PM 5.4 <5.7 % Final    Comment:      For the purpose of screening for the presence of diabetes:   <5.7%       Consistent with the absence of diabetes 5.7-6.4 %   Consistent with increased risk for diabetes (prediabetes) >=6.5 %     Consistent with diabetes   This assay result is consistent with a decreased risk of diabetes.   Currently, no consensus exists regarding use of hemoglobin A1c for diagnosis of diabetes in children.   According to American Diabetes Association (ADA) guidelines, hemoglobin A1c <7.0% represents optimal control in non-pregnant diabetic patients. Different metrics may apply to specific patient populations. Standards of Medical Care in Diabetes (ADA).       CBG: No results for input(s): "GLUCAP" in the last 168 hours.  Review of Systems:   Review of Systems  Constitutional:  Negative for chills, fever, malaise/fatigue and weight loss.  HENT: Negative.    Eyes: Negative.   Respiratory:  Positive for shortness of breath.   Cardiovascular: Negative.   Gastrointestinal: Negative.   Genitourinary:  Negative.   Musculoskeletal: Negative.   Skin: Negative.   Neurological: Negative.   Endo/Heme/Allergies: Negative.   Psychiatric/Behavioral:  Positive for memory loss.      Past Medical History:  She,  has a past medical history of Anxiety, Breast cancer ([place]) (fall 1997), Depression, Endometriosis (in her 68's), GERD (gastroesophageal reflux disease), HTN (hypertension), and Migraines.   Surgical History:   Past Surgical History:  Procedure Laterality Date   BREAST LUMPECTOMY Left fall 1997   Lymph nodes X 3 were negative, ER+, Radiation treatmetn.  Took Tamoxifem X 5 yrs then Evista for  3-5 yrs.   CESAREAN SECTION     x2  first pregnancy breach and second normal   surgery for endometriosis       Social History:   reports that she quit smoking about 33 years ago. Her smoking use included cigarettes. She has a 12.50 pack-year smoking history. She has never used smokeless tobacco. She reports current alcohol use of about 7.0 standard drinks of alcohol per week. She reports that she does not use drugs.   Family History:  Her family history includes Heart attack in her father; Heart disease in her father; Hypertension in her mother; Stroke in her mother; Throat cancer in an other family member. There is no history of Breast cancer, Colon cancer, Diabetes, or Neuropathy.   Allergies Allergies  Allergen Reactions   Penicillins Rash    Has patient had a PCN reaction causing immediate rash, facial/tongue/throat swelling, SOB or lightheadedness with hypotension: Yes Has patient had a PCN reaction causing severe rash involving mucus membranes or skin necrosis: No Has patient had a PCN reaction that required hospitalization No Has patient had a PCN reaction occurring within the last 10 years: No If all of the above answers are "NO", then may proceed with Cephalosporin use.      Home Medications  Prior to Admission medications   Medication Sig Start Date End Date Taking? Authorizing  Provider  alendronate (FOSAMAX) 70 MG tablet Take 1 tablet (70 mg total) by mouth every 7 (seven) days. Take with a full glass of water on an empty stomach. 05/15/21   Colon Branch, MD  aspirin EC 81 MG tablet Take 1 tablet (81 mg total) by mouth daily. Swallow whole. 12/27/19   Melvenia Beam, MD  escitalopram (LEXAPRO) 20 MG tablet TAKE ONE TABLET BY MOUTH ONE TIME DAILY **Schedule appt** 04/18/20   Cottle, Billey Co., MD  folic acid (FOLVITE) 1 MG tablet Take 1 tablet (1 mg total) by mouth daily. 07/25/20   Colon Branch, MD  OLANZapine (ZYPREXA) 7.5 MG tablet TAKE ONE TABLET BY MOUTH DAILY AT BEDTIME 12/20/19   Cottle, Billey Co., MD  pantoprazole (PROTONIX) 40 MG tablet Take 1 tablet (40 mg total) by mouth daily. 07/25/20   Colon Branch, MD  thiamine 100 MG tablet Take 1 tablet (100 mg total) by mouth daily. 12/02/19   Colon Branch, MD     Critical care time: NA  Erick Colace ACNP-BC [place] Pager # [phone] OR # [phone] if no answer

## 2021-07-08 NOTE — Progress Notes (Signed)
Orthopedic Tech Progress Note Patient Details:  Monique Ballard 1940/08/30 [id number]  Ortho Devices Type of Ortho Device: Short leg splint, Stirrup splint Ortho Device/Splint Location: RLE Ortho Device/Splint Interventions: Ordered, Application, Adjustment   Post Interventions Patient Tolerated: Well Splint applied, patient seemed to be slightly externally rotated, so I applied it to my best ability. Vernona Rieger 07/08/2021, 4:50 PM

## 2021-07-08 NOTE — ED Notes (Signed)
Admitting MD at bedside.

## 2021-07-08 NOTE — H&P (Signed)
History and Physical    Monique Ballard LPF:[id number] DOB: 10-Nov-1940 DOA: 07/08/2021  PCP: Colon Branch, MD (Confirm with patient/family/NH records and if not entered, this has to be entered at Monique Ballard point of entry) Patient coming from: Home  I have personally briefly reviewed patient's old medical records in [place]  Chief Complaint: SOB  HPI: Monique Ballard is a 81 y.o. female with medical history significant of chronic hypoxic respiratory failure noncoherent to home O2, alcohol dependence, breast cancer status post lumpectomy and agent therapy, GERD, anxiety/depression, presented with hypoxia.  Patient started to help exertional dyspnea for the last 2 to 3 days.  At baseline, she has had chronic exertional dyspnea but worsened in the last 3 days.  Denies any cough no wheezing no fever chills no chest pains.  2 years ago, patient was incidentally found hypoxia in her dentist office and was sent to ED, and she was sent home with home O2.  She reported that she used to home O2 for some time and then discontinued herself.  She has not followed up with pulmonology for the last 2 years.  She also sustained a mechanical fall and hit her right ankle and started to feel pain this morning.  ED Course: Patient was found hypoxic with saturation 77% on room air, and patient was started on BiPAP then switched to nonrebreather 15 L.  Chest x-ray clear for infiltrates, CT angiogram negative for PE.  Right ankle x-ray showed nondisplaced right distal fibular/metaphysis fracture.  Review of Systems: As per HPI otherwise 14 point review of systems negative.    Past Medical History:  Diagnosis Date   Anxiety    Breast cancer ([place]) fall 1997    Left Lumpectomy, XRT.  treated with Tamoxifem and Evista   Depression    Endometriosis in her 20's   surgery to help clear endometriosis to obtain a pregnancy.   GERD (gastroesophageal reflux disease)    w/"stretching" remotely   HTN (hypertension)     Migraines     Past Surgical History:  Procedure Laterality Date   BREAST LUMPECTOMY Left fall 1997   Lymph nodes X 3 were negative, ER+, Radiation treatmetn.  Took Tamoxifem X 5 yrs then Evista for 3-5 yrs.   CESAREAN SECTION     x2  first pregnancy breach and second normal   surgery for endometriosis       reports that she quit smoking about 33 years ago. Her smoking use included cigarettes. She has a 12.50 pack-year smoking history. She has never used smokeless tobacco. She reports current alcohol use of about 7.0 standard drinks of alcohol per week. She reports that she does not use drugs.  Allergies  Allergen Reactions   Penicillins Rash    Has patient had a PCN reaction causing immediate rash, facial/tongue/throat swelling, SOB or lightheadedness with hypotension: Yes Has patient had a PCN reaction causing severe rash involving mucus membranes or skin necrosis: No Has patient had a PCN reaction that required hospitalization No Has patient had a PCN reaction occurring within the last 10 years: No If all of the above answers are "NO", then may proceed with Cephalosporin use.     Family History  Problem Relation Age of Onset   Hypertension Mother    Stroke Mother    Heart attack Father        elderly   Heart disease Father        CHF   Throat cancer Other  Breast cancer Neg Hx    Colon cancer Neg Hx    Diabetes Neg Hx    Neuropathy Neg Hx      Prior to Admission medications   Medication Sig Start Date End Date Taking? Authorizing Provider  alendronate (FOSAMAX) 70 MG tablet Take 1 tablet (70 mg total) by mouth every 7 (seven) days. Take with a full glass of water on an empty stomach. 05/15/21   Colon Branch, MD  aspirin EC 81 MG tablet Take 1 tablet (81 mg total) by mouth daily. Swallow whole. 12/27/19   Melvenia Beam, MD  escitalopram (LEXAPRO) 20 MG tablet TAKE ONE TABLET BY MOUTH ONE TIME DAILY **Schedule appt** 04/18/20   Cottle, Billey Co., MD  folic acid  (FOLVITE) 1 MG tablet Take 1 tablet (1 mg total) by mouth daily. 07/25/20   Colon Branch, MD  OLANZapine (ZYPREXA) 7.5 MG tablet TAKE ONE TABLET BY MOUTH DAILY AT BEDTIME 12/20/19   Cottle, Billey Co., MD  pantoprazole (PROTONIX) 40 MG tablet Take 1 tablet (40 mg total) by mouth daily. 07/25/20   Colon Branch, MD  thiamine 100 MG tablet Take 1 tablet (100 mg total) by mouth daily. 12/02/19   Colon Branch, MD    Physical Exam: Vitals:   07/08/21 1200 07/08/21 1215 07/08/21 1230 07/08/21 1245  BP: 126/78 123/80 (!) 129/99 127/87  Pulse: (!) 110 (!) 110 (!) 108 (!) 114  Resp: 16 (!) 26 (!) 24 (!) 24  Temp:      SpO2: 92% 93% 96% 91%    Constitutional: NAD, calm, comfortable Vitals:   07/08/21 1200 07/08/21 1215 07/08/21 1230 07/08/21 1245  BP: 126/78 123/80 (!) 129/99 127/87  Pulse: (!) 110 (!) 110 (!) 108 (!) 114  Resp: 16 (!) 26 (!) 24 (!) 24  Temp:      SpO2: 92% 93% 96% 91%   Eyes: PERRL, lids and conjunctivae normal ENMT: Mucous membranes are moist. Posterior pharynx clear of any exudate or lesions.Normal dentition.  Neck: normal, supple, no masses, no thyromegaly Respiratory: clear to auscultation bilaterally, no wheezing, no crackles. Normal respiratory effort. No accessory muscle use.  Cardiovascular: Regular rate and rhythm, no murmurs / rubs / gallops. No extremity edema. 2+ pedal pulses. No carotid bruits.  Abdomen: no tenderness, no masses palpated. No hepatosplenomegaly. Bowel sounds positive.  Musculoskeletal: no clubbing / cyanosis. No joint deformity upper and lower extremities. Good ROM, no contractures. Normal muscle tone.  Skin: no rashes, lesions, ulcers. No induration Neurologic: CN 2-12 grossly intact. Sensation intact, DTR normal. Strength 5/5 in all 4.  Psychiatric: Normal judgment and insight. Alert and oriented x 3. Normal mood.     Labs on Admission: I have personally reviewed following labs and imaging studies  CBC: Recent Labs  Lab 07/08/21 0932  07/08/21 0949 07/08/21 1000  WBC 10.7*  --   --   NEUTROABS 6.6  --   --   HGB 16.9* 16.3* 18.0*  HCT 48.3* 48.0* 53.0*  MCV 91.1  --   --   PLT 278  --   --    Basic Metabolic Panel: Recent Labs  Lab 07/08/21 0932 07/08/21 0949 07/08/21 1000  NA 134* 131* 133*  K 3.3* 3.3* 3.2*  CL 98  --  99  CO2 20*  --   --   GLUCOSE 137*  --  133*  BUN 16  --  17  CREATININE 1.03*  --  0.70  CALCIUM 9.8  --   --  GFR: CrCl cannot be calculated (Unknown ideal weight.). Liver Function Tests: Recent Labs  Lab 07/08/21 0932  AST 25  ALT 22  ALKPHOS 70  BILITOT 2.1*  PROT 7.3  ALBUMIN 3.6   No results for input(s): "LIPASE", "AMYLASE" in the last 168 hours. No results for input(s): "AMMONIA" in the last 168 hours. Coagulation Profile: Recent Labs  Lab 07/08/21 0932  INR 1.1   Cardiac Enzymes: No results for input(s): "CKTOTAL", "CKMB", "CKMBINDEX", "TROPONINI" in the last 168 hours. BNP (last 3 results) No results for input(s): "PROBNP" in the last 8760 hours. HbA1C: No results for input(s): "HGBA1C" in the last 72 hours. CBG: No results for input(s): "GLUCAP" in the last 168 hours. Lipid Profile: No results for input(s): "CHOL", "HDL", "LDLCALC", "TRIG", "CHOLHDL", "LDLDIRECT" in the last 72 hours. Thyroid Function Tests: No results for input(s): "TSH", "T4TOTAL", "FREET4", "T3FREE", "THYROIDAB" in the last 72 hours. Anemia Panel: No results for input(s): "VITAMINB12", "FOLATE", "FERRITIN", "TIBC", "IRON", "RETICCTPCT" in the last 72 hours. Urine analysis: No results found for: "COLORURINE", "APPEARANCEUR", "LABSPEC", "PHURINE", "GLUCOSEU", "HGBUR", "BILIRUBINUR", "KETONESUR", "PROTEINUR", "UROBILINOGEN", "NITRITE", "LEUKOCYTESUR"  Radiological Exams on Admission: CT Angio Chest PE W and/or Wo Contrast  Result Date: 07/08/2021 CLINICAL DATA:  81 year old female with suspected pulmonary embolism. EXAM: CT ANGIOGRAPHY CHEST WITH CONTRAST TECHNIQUE: Multidetector CT  imaging of the chest was performed using the standard protocol during bolus administration of intravenous contrast. Multiplanar CT image reconstructions and MIPs were obtained to evaluate the vascular anatomy. RADIATION DOSE REDUCTION: This exam was performed according to the departmental dose-optimization program which includes automated exposure control, adjustment of the mA and/or kV according to patient size and/or use of iterative reconstruction technique. CONTRAST:  157m OMNIPAQUE IOHEXOL 350 MG/ML SOLN COMPARISON:  CTA of the chest 09/30/2019. FINDINGS: Cardiovascular: No filling defects within the pulmonary arterial tree to suggest pulmonary embolism. Heart size is normal. There is no significant pericardial fluid, thickening or pericardial calcification. Aortic atherosclerosis with ectasia of the ascending thoracic aorta (3.9 cm in diameter). No definite coronary artery calcifications. Mediastinum/Nodes: No pathologically enlarged mediastinal or hilar lymph nodes. Esophagus is unremarkable in appearance. No axillary lymphadenopathy. Lungs/Pleura: No acute consolidative airspace disease. No pleural effusions. No definite suspicious appearing pulmonary nodules or masses are noted. Upper Abdomen: Aortic atherosclerosis. Musculoskeletal: New compression fracture of T11 with 70% loss of anterior vertebral body height. Fracture lines are evident and there is a trace amount of paravertebral soft tissue swelling. Healing fracture of the posterolateral aspect of the right ninth rib. Old healed fracture of the posterolateral aspect of the right tenth rib. Review of the MIP images confirms the above findings. IMPRESSION: 1. No evidence of pulmonary embolism. 2. New, likely subacute compression fracture of T11 with 70% loss of anterior vertebral body height. 3. Aortic atherosclerosis with ectasia of the ascending thoracic aorta (3.9 cm in diameter. Aortic Atherosclerosis (ICD10-I70.0). Electronically Signed   By:  DVinnie LangtonM.D.   On: 07/08/2021 10:54   DG Chest Portable 1 View  Result Date: 07/08/2021 CLINICAL DATA:  Hypoxia and shortness of breath since yesterday. Fall yesterday. EXAM: PORTABLE CHEST 1 VIEW COMPARISON:  05/03/2021 FINDINGS: Lungs are adequately inflated and otherwise clear. Cardiomediastinal silhouette is normal. Remainder of the exam is unremarkable. IMPRESSION: No active disease. Electronically Signed   By: DMarin OlpM.D.   On: 07/08/2021 10:29    EKG: Independently reviewed. Sinus, LVH  Assessment/Plan Principal Problem:   Hypoxia Active Problems:   Alcohol dependency (HCC)   Acute respiratory  failure ([place])  (please populate well all problems here in Problem List. (For example, if patient is on BP meds at home and you resume or decide to hold them, it is a problem that needs to be her. Same for CAD, COPD, HLD and so on)  Acute on chronic hypoxic respiratory failure -Unknown etiology, discussed with PCCM attending at bedside, initial impression is hepatopulmonary syndrome versus other shunt conditions.  Echocardiogram, RUQ ultrasound. -She also has polycythemia, which likely reflects a chronic hypoxia -Other Ddx, PE ruled out.  Patient does history of cigarette smoke but she quit smoking more than 10 years ago and CT angiogram does not show any signs of emphysema and there is no wheezing or crackles on physical exam, will not make COPD less likely.  PFT outpatient; there is no previous echocardiogram to compare but, pulm hypertension is also highly possible; She has Hx of radiation therapy but no significant  Right ankle fracture -X-ray showed nondisplaced right distal fibular fracture, D/W with [facility], splinter for now and nonweightbearing, follow-up with Dr. Kathaleen Bury in 7 days.  Hypokalemia -P.o. replacement, check magnesium level  Polycythemia -Likely secondary to chronic hypoxia  Elevated lactate -Euvolemic, discussed with PCCM, with concern  about underlying liver disease, agreed with no aggressive hydration at this point.  Clinically there is no cough, no infiltrate on x-ray, no diarrhea no urinary symptoms, monitor off antibiotics.  Sinus tachycardia -Check TSH, clinically appears to be euvolemic, no further hydration as seasoned above. -Start low-dose of metoprolol  Alcohol abuse -No symptoms or signs of active withdrawal, CIWA protocol with as needed benzos  DVT prophylaxis: Lovenox Code Status: Full code Family Communication: Daughter at bedside Disposition Plan: Patient sick with acute on chronic hypoxia with unknown etiology, expect more than 2 midnight hospital stay. Consults called: PCCM, orthopedic surgery Admission status: PCU   Lequita Halt MD [facility] Pager [phone]  07/08/2021, 12:52 PM

## 2021-07-08 NOTE — ED Notes (Signed)
RT at bedside to place patient on BIPAP

## 2021-07-08 NOTE — Progress Notes (Signed)
Placed patient on BiPAP per order 

## 2021-07-08 NOTE — ED Triage Notes (Signed)
Patient reports sob since yesterday but worse today.  Low 90's on NRB.  Patient also had fall yesterday with swollen right ankle.

## 2021-07-08 NOTE — Plan of Care (Signed)

## 2021-07-08 NOTE — ED Notes (Signed)
I have called for purple man, still waiting on purple man

## 2021-07-08 NOTE — ED Notes (Signed)
RT switched patient to NRB at this time.  Will continue to monitor

## 2021-07-08 NOTE — Progress Notes (Signed)
Took patient off of BiPAP and placed back on NRB, Dr Alvino Chapel at bedside and approved, RN notified if patient request to go back on BiPAP to notify  RT

## 2021-07-08 NOTE — ED Provider Notes (Addendum)
[facility] EMERGENCY DEPARTMENT Provider Note   CSN: [id number] Arrival date & time: 07/08/21  9371     History  Chief Complaint  Patient presents with   Shortness of Breath    Monique Ballard is a 81 y.o. female.   Shortness of Breath  Patient presented with shortness of breath.  Some shortness of breath since yesterday.  No coughing.  Reportedly more dyspnea and patient's daughter came to see her and she seemed short of breath.  Upon arrival was on nonrebreather with sats in the 70s.  No cough.  No fevers.  No underlying issue but I reviewed the notes and found that she had a similar episode around 2 years ago.  Unknown cause of severe hypoxia.  Did have a fall a couple days ago with right ankle pain and some swelling.  No dysuria.  Reportedly has likely some baseline dementia although has not been diagnosed with it.   Past Medical History:  Diagnosis Date   Anxiety    Breast cancer ([place]) fall 1997    Left Lumpectomy, XRT.  treated with Tamoxifem and Evista   Depression    Endometriosis in her 20's   surgery to help clear endometriosis to obtain a pregnancy.   GERD (gastroesophageal reflux disease)    w/"stretching" remotely   HTN (hypertension)    Migraines     Home Medications Prior to Admission medications   Medication Sig Start Date End Date Taking? Authorizing Provider  alendronate (FOSAMAX) 70 MG tablet Take 1 tablet (70 mg total) by mouth every 7 (seven) days. Take with a full glass of water on an empty stomach. 05/15/21   Colon Branch, MD  aspirin EC 81 MG tablet Take 1 tablet (81 mg total) by mouth daily. Swallow whole. 12/27/19   Melvenia Beam, MD  escitalopram (LEXAPRO) 20 MG tablet TAKE ONE TABLET BY MOUTH ONE TIME DAILY **Schedule appt** 04/18/20   Cottle, Billey Co., MD  folic acid (FOLVITE) 1 MG tablet Take 1 tablet (1 mg total) by mouth daily. 07/25/20   Colon Branch, MD  OLANZapine (ZYPREXA) 7.5 MG tablet TAKE ONE TABLET BY MOUTH DAILY AT  BEDTIME 12/20/19   Cottle, Billey Co., MD  pantoprazole (PROTONIX) 40 MG tablet Take 1 tablet (40 mg total) by mouth daily. 07/25/20   Colon Branch, MD  thiamine 100 MG tablet Take 1 tablet (100 mg total) by mouth daily. 12/02/19   Colon Branch, MD      Allergies    Penicillins    Review of Systems   Review of Systems  Respiratory:  Positive for shortness of breath.     Physical Exam Updated Vital Signs BP (!) 126/99 Comment: nrb  Pulse (!) 114 Comment: nrb  Temp 98.2 F (36.8 C)   Resp 16 Comment: nrb  LMP 01/22/1992 (Within Months)   SpO2 94% Comment: nrb Physical Exam Vitals reviewed.  HENT:     Head: Normocephalic.  Cardiovascular:     Rate and Rhythm: Tachycardia present.  Pulmonary:     Effort: No respiratory distress.     Comments: Mildly harsh breath sound without focal rales or rhonchi. Chest:     Chest wall: No tenderness.  Musculoskeletal:     Comments: Mild edema on right lower extremity with mild erythema.  Tenderness on ankle without large deformity.  Skin:    General: Skin is warm.     Capillary Refill: Capillary refill takes less than 2  seconds.  Neurological:     Mental Status: She is alert and oriented to person, place, and time.     ED Results / Procedures / Treatments   Labs (all labs ordered are listed, but only abnormal results are displayed) Labs Reviewed  COMPREHENSIVE METABOLIC PANEL - Abnormal; Notable for the following components:      Result Value   Sodium 134 (*)    Potassium 3.3 (*)    CO2 20 (*)    Glucose, Bld 137 (*)    Creatinine, Ser 1.03 (*)    Total Bilirubin 2.1 (*)    GFR, Estimated 56 (*)    Anion gap 16 (*)    All other components within normal limits  LACTIC ACID, PLASMA - Abnormal; Notable for the following components:   Lactic Acid, Venous 2.3 (*)    All other components within normal limits  CBC WITH DIFFERENTIAL/PLATELET - Abnormal; Notable for the following components:   WBC 10.7 (*)    RBC 5.30 (*)     Hemoglobin 16.9 (*)    HCT 48.3 (*)    Monocytes Absolute 2.0 (*)    Abs Immature Granulocytes 0.18 (*)    All other components within normal limits  COOXEMETRY PANEL - Abnormal; Notable for the following components:   Total hemoglobin 16.1 (*)    Carboxyhemoglobin 1.8 (*)    All other components within normal limits  I-STAT ARTERIAL BLOOD GAS, ED - Abnormal; Notable for the following components:   pH, Arterial 7.483 (*)    pCO2 arterial 20.8 (*)    pO2, Arterial 37 (*)    Bicarbonate 15.6 (*)    TCO2 16 (*)    Acid-base deficit 5.0 (*)    Sodium 131 (*)    Potassium 3.3 (*)    HCT 48.0 (*)    Hemoglobin 16.3 (*)    All other components within normal limits  I-STAT CHEM 8, ED - Abnormal; Notable for the following components:   Sodium 133 (*)    Potassium 3.2 (*)    Glucose, Bld 133 (*)    TCO2 18 (*)    Hemoglobin 18.0 (*)    HCT 53.0 (*)    All other components within normal limits  TROPONIN I (HIGH SENSITIVITY) - Abnormal; Notable for the following components:   Troponin I (High Sensitivity) 30 (*)    All other components within normal limits  CULTURE, BLOOD (ROUTINE X 2)  CULTURE, BLOOD (ROUTINE X 2)  SARS CORONAVIRUS 2 BY RT PCR  PROTIME-INR  BRAIN NATRIURETIC PEPTIDE  LACTIC ACID, PLASMA  URINALYSIS, ROUTINE W REFLEX MICROSCOPIC  TROPONIN I (HIGH SENSITIVITY)    EKG EKG Interpretation  Date/Time:  Sunday July 08 2021 09:25:59 EDT Ventricular Rate:  110 PR Interval:  154 QRS Duration: 96 QT Interval:  367 QTC Calculation: 497 R Axis:   270 Text Interpretation: Sinus tachycardia Probable left atrial enlargement LAD, consider LAFB or inferior infarct Abnormal R-wave progression, late transition LVH with secondary repolarization abnormality Borderline prolonged QT interval Confirmed by Davonna Belling [phone]) on 07/08/2021 11:34:17 AM  Radiology CT Angio Chest PE W and/or Wo Contrast  Result Date: 07/08/2021 CLINICAL DATA:  81 year old female with suspected  pulmonary embolism. EXAM: CT ANGIOGRAPHY CHEST WITH CONTRAST TECHNIQUE: Multidetector CT imaging of the chest was performed using the standard protocol during bolus administration of intravenous contrast. Multiplanar CT image reconstructions and MIPs were obtained to evaluate the vascular anatomy. RADIATION DOSE REDUCTION: This exam was performed according to the departmental  dose-optimization program which includes automated exposure control, adjustment of the mA and/or kV according to patient size and/or use of iterative reconstruction technique. CONTRAST:  136m OMNIPAQUE IOHEXOL 350 MG/ML SOLN COMPARISON:  CTA of the chest 09/30/2019. FINDINGS: Cardiovascular: No filling defects within the pulmonary arterial tree to suggest pulmonary embolism. Heart size is normal. There is no significant pericardial fluid, thickening or pericardial calcification. Aortic atherosclerosis with ectasia of the ascending thoracic aorta (3.9 cm in diameter). No definite coronary artery calcifications. Mediastinum/Nodes: No pathologically enlarged mediastinal or hilar lymph nodes. Esophagus is unremarkable in appearance. No axillary lymphadenopathy. Lungs/Pleura: No acute consolidative airspace disease. No pleural effusions. No definite suspicious appearing pulmonary nodules or masses are noted. Upper Abdomen: Aortic atherosclerosis. Musculoskeletal: New compression fracture of T11 with 70% loss of anterior vertebral body height. Fracture lines are evident and there is a trace amount of paravertebral soft tissue swelling. Healing fracture of the posterolateral aspect of the right ninth rib. Old healed fracture of the posterolateral aspect of the right tenth rib. Review of the MIP images confirms the above findings. IMPRESSION: 1. No evidence of pulmonary embolism. 2. New, likely subacute compression fracture of T11 with 70% loss of anterior vertebral body height. 3. Aortic atherosclerosis with ectasia of the ascending thoracic aorta  (3.9 cm in diameter. Aortic Atherosclerosis (ICD10-I70.0). Electronically Signed   By: DVinnie LangtonM.D.   On: 07/08/2021 10:54   DG Chest Portable 1 View  Result Date: 07/08/2021 CLINICAL DATA:  Hypoxia and shortness of breath since yesterday. Fall yesterday. EXAM: PORTABLE CHEST 1 VIEW COMPARISON:  05/03/2021 FINDINGS: Lungs are adequately inflated and otherwise clear. Cardiomediastinal silhouette is normal. Remainder of the exam is unremarkable. IMPRESSION: No active disease. Electronically Signed   By: DMarin OlpM.D.   On: 07/08/2021 10:29    Procedures Procedures    Medications Ordered in ED Medications  methylPREDNISolone sodium succinate (SOLU-MEDROL) 125 mg/2 mL injection 125 mg (125 mg Intravenous Given 07/08/21 0959)  albuterol (PROVENTIL) (2.5 MG/3ML) 0.083% nebulizer solution 5 mg (5 mg Nebulization Given 07/08/21 1000)  ipratropium (ATROVENT) nebulizer solution 0.5 mg (0.5 mg Nebulization Given 07/08/21 1000)  sterile water (preservative free) injection (2 mLs  Given 07/08/21 1010)  lactated ringers bolus 1,000 mL (1,000 mLs Intravenous New Bag/Given 07/08/21 1007)  iohexol (OMNIPAQUE) 350 MG/ML injection 100 mL (100 mLs Intravenous Contrast Given 07/08/21 1027)    ED Course/ Medical Decision Making/ A&P                           Medical Decision Making Amount and/or Complexity of Data Reviewed Labs: ordered. Radiology: ordered.  Risk Prescription drug management.   Patient presented with severe hypoxia.  Was on nonrebreather and still had sats of around 77%.  ABG done and did verify that this was an accurate reading.  Lungs just mildly harsh breath sounds but not focal rales or rhonchi.  Reviewed records from previous inpatient admission and had similar symptoms treated with breathing treatments and steroids.  Started on BiPAP.  Chest x-ray independently interpreted and was reassuring without clear cause.  Did have some tenderness on the right ankle and some edema.   Thought of pulmonary embolism/DVT.  CT angiography done and showed no clear cause of the breathing and specifically pulmonary embolism.  Patient had been started on BiPAP and improved significantly.  Did have breathing treatment.  Saturation improved and now on nonrebreather and attempting to titrate down oxygen.  Troponin mildly elevated  but I think likely secondary to the hypoxia.  Doubt primary cardiac cause.  Will admit to hospitalist  Even with the severe hypoxia patient did not look dyspneic and was well-appearing.  Patient now in the mid 90s on the nonrebreather.  Also discussed with Marni Griffon from pulmonary critical care who will see patient.  CRITICAL CARE Performed by: Davonna Belling Total critical care time: 30 minutes Critical care time was exclusive of separately billable procedures and treating other patients. Critical care was necessary to treat or prevent imminent or life-threatening deterioration. Critical care was time spent personally by me on the following activities: development of treatment plan with patient and/or surrogate as well as nursing, discussions with consultants, evaluation of patient's response to treatment, examination of patient, obtaining history from patient or surrogate, ordering and performing treatments and interventions, ordering and review of laboratory studies, ordering and review of radiographic studies, pulse oximetry and re-evaluation of patient's condition.          Final Clinical Impression(s) / ED Diagnoses Final diagnoses:  Hypoxia    Rx / DC Orders ED Discharge Orders     None         Davonna Belling, MD 07/08/21 1137    Davonna Belling, MD 07/08/21 1201

## 2021-07-08 NOTE — ED Notes (Signed)
RT at bedside.

## 2021-07-08 NOTE — ED Notes (Signed)
Patient sats dropped to 85-88% NRB MD and RT notified. MD wants sats >90%

## 2021-07-08 NOTE — ED Notes (Signed)
CT called, unable to go at this time, CT to call back when ready

## 2021-07-09 ENCOUNTER — Inpatient Hospital Stay (HOSPITAL_COMMUNITY): Payer: Medicare Other

## 2021-07-09 DIAGNOSIS — R0902 Hypoxemia: Secondary | ICD-10-CM | POA: Diagnosis not present

## 2021-07-09 DIAGNOSIS — J9621 Acute and chronic respiratory failure with hypoxia: Secondary | ICD-10-CM | POA: Diagnosis not present

## 2021-07-09 LAB — ECHOCARDIOGRAM COMPLETE BUBBLE STUDY
AR max vel: 3.45 cm2
AV Area VTI: 3.33 cm2
AV Area mean vel: 3.13 cm2
AV Mean grad: 2 mmHg
AV Peak grad: 2.5 mmHg
Ao pk vel: 0.78 m/s
Area-P 1/2: 5.31 cm2
S' Lateral: 3.6 cm

## 2021-07-09 MED ORDER — ACETAMINOPHEN 325 MG PO TABS
650.0000 mg | ORAL_TABLET | Freq: Four times a day (QID) | ORAL | Status: DC | PRN
  Administered 2021-07-09: 650 mg via ORAL
  Filled 2021-07-09: qty 2

## 2021-07-09 MED ORDER — THIAMINE HCL 100 MG/ML IJ SOLN
500.0000 mg | Freq: Three times a day (TID) | INTRAVENOUS | Status: DC
  Administered 2021-07-09 – 2021-07-12 (×9): 500 mg via INTRAVENOUS
  Filled 2021-07-09 (×11): qty 5

## 2021-07-09 MED ORDER — HALOPERIDOL 1 MG PO TABS
2.0000 mg | ORAL_TABLET | Freq: Two times a day (BID) | ORAL | Status: DC
  Administered 2021-07-10: 2 mg via ORAL
  Filled 2021-07-09: qty 2

## 2021-07-09 MED ORDER — HALOPERIDOL 1 MG PO TABS
2.0000 mg | ORAL_TABLET | Freq: Every day | ORAL | Status: AC
  Administered 2021-07-09: 2 mg via ORAL
  Filled 2021-07-09: qty 2

## 2021-07-09 NOTE — TOC Initial Note (Signed)
Transition of Care [facility]) - Initial/Assessment Note    Patient Details  Name: Monique Ballard MRN: [id number] Date of Birth: [birth year]  [facility] CM/SW Contact:    Pollie Friar, RN Phone Number: 07/09/2021, 3:53 PM  Clinical Narrative:                 Patient slept through CM visit as she had Ativan this am. CM spoke with the daughter at the bedside. She states the patients spouse is with her most of the time. Patients son is there at night.  Spouse was managing the patients medications at home up until about a week ago when they let the patient take over her meds. Daughter states they will be managing the patients meds at home from now on.  Spouse provides the needed transportation.  Daughter states the patient was on home oxygen up until recently. She still has portable tanks but no concentrator. Daughter interested in having her oxygen sats checked while in the hospital.  Also if PT/OT can assess for home needs prior to d/c. CM will ask MD.  TOC following.  Expected Discharge Plan: Home/Self Care Barriers to Discharge: Continued Medical Work up   Patient Goals and CMS Choice        Expected Discharge Plan and Services Expected Discharge Plan: Home/Self Care       Living arrangements for the past 2 months: Single Family Home                                      Prior Living Arrangements/Services Living arrangements for the past 2 months: Single Family Home Lives with:: Adult Children, Spouse Patient language and need for interpreter reviewed:: Yes Do you feel safe going back to the place where you live?: Yes        Care giver support system in place?: Yes (comment) Current home services: DME (quad cane/ walker/ shower seat/ portable O2 tank/ grab bars in shower/ elevated toilet seat with handles) Criminal Activity/Legal Involvement Pertinent to Current Situation/Hospitalization: No - Comment as needed  Activities of Daily Living Home  Assistive Devices/Equipment: Bedside commode/3-in-1, Cane (specify quad or straight), Grab bars in shower ADL Screening (condition at time of admission) Patient's cognitive ability adequate to safely complete daily activities?: Yes Is the patient deaf or have difficulty hearing?: No Does the patient have difficulty seeing, even when wearing glasses/contacts?: No Does the patient have difficulty concentrating, remembering, or making decisions?: No Patient able to express need for assistance with ADLs?: Yes Does the patient have difficulty dressing or bathing?: Yes Independently performs ADLs?: No Communication: Independent Dressing (OT): Needs assistance Is this a change from baseline?: Change from baseline, expected to last >3 days Grooming: Independent Feeding: Independent Bathing: Needs assistance Is this a change from baseline?: Change from baseline, expected to last >3 days Toileting: Needs assistance Is this a change from baseline?: Change from baseline, expected to last >3days In/Out Bed: Dependent Is this a change from baseline?: Change from baseline, expected to last >3 days Walks in Home: Dependent Is this a change from baseline?: Change from baseline, expected to last >3 days Does the patient have difficulty walking or climbing stairs?: Yes Weakness of Legs: Right Weakness of Arms/Hands: None  Permission Sought/Granted                  Emotional Assessment Appearance:: Appears stated age  Psych Involvement: Yes (comment)  Admission diagnosis:  Hypoxia [R09.02] Patient Active Problem List   Diagnosis Date Noted   Hypoxia 07/08/2021   Nocturnal hypoxia 07/26/2020   Hypokalemia 10/01/2019   Acute respiratory failure ([place]) 09/30/2019   Alcohol dependency ([place]) 05/15/2018   Tremor 02/24/2015   Gait disorder 02/24/2015   PCP NOTES >>>>>>> 12/13/2014   Abdominal wall strain 03/19/2012   Osteoporosis 10/04/2011   Annual physical exam 09/07/2010   GERD  08/28/2009   Anxiety and depression 10/06/2006   Essential hypertension 10/06/2006   BREAST CANCER, HX OF 10/06/2006   PCP:  Colon Branch, MD Pharmacy:   [facility] [address], [place] [address] [place] [place] [address] Phone: [phone] Fax: [phone]     Social Determinants of Health (SDOH) Interventions    Readmission Risk Interventions     No data to display

## 2021-07-09 NOTE — Progress Notes (Signed)
  Echocardiogram 2D Echocardiogram has been performed.  Monique Ballard 07/09/2021, 2:40 PM

## 2021-07-09 NOTE — Consult Note (Cosign Needed Addendum)
Consult Psychiatry Brief Note    Patient was asleep after receiving Ativan '1mg'$  PRN around 8:40AM. Patient was not arousal to tactile stimuli, but vitals appear stable. Patient's daughter Karle Plumber was in the room. [name] is a PT at [employer]. [name] was able to endorse that she believes patient is drinking at min 2 glasses of wine/ day, but thinks it could be more. [name] reports that she is concerned her mother's sudden behavior overnight is 2/2 to Time also reports that she has noticed some slight cognitive changes over time in patient.    Assessment It is possible that the reported hallucinations and agitation are 2/2 Etoh withdrawal. Patient would benefit from low dose scheduled Haldol due to her age and being neuroleptic naive. Patient may also benefit from starting a lower dose for PRN Ativan due to concern from respiratory failure at initial encounter and advanced age. Higher dose thiamine may be helpful fore other reversible causes for agitated behavior.  Plan -- Start Haldol '2mg'$  BID, agitation and hallucinations -- Increase thiamine to '500mg'$  q8h IV, thiamine deficiency  -- Recommend changing Ativan 1-'4mg'$  PRN q 1h to 0.'5mg'$  q8h PRN -- Discontinue Zyprexa 7.'5mg'$  QHS, at increased risk for resp depression if given with Ativan -- Continue CIWA   PGY-2 Damita Dunnings, MD

## 2021-07-09 NOTE — Progress Notes (Signed)
PROGRESS NOTE    Monique Ballard  YHC:[id number] DOB: 03/19/1940 DOA: 07/08/2021 PCP: Colon Branch, MD    Brief Narrative:  This 81 years old female with PMH significant for chronic hypoxic respiratory failure, noncompliant with home oxygen, alcohol dependence, breast cancer status postlumpectomy and chemotherapy, GERD, anxiety/depression presented with exertional shortness of breath for 2 to 3 days.  At baseline she has chronic exertional dyspnea but has worsened in the last few days.  Patient was using oxygen on and off for last 2 years.  She has not followed up with any pulmonologist.  Patient also sustained a mechanical fall and hit her right ankle and started to feel pain. Patient was admitted for acute on chronic hypoxic respiratory failure requiring BiPAP and nondisplaced right distal fibular fracture. She is successfully weaned down to high flow nasal cannula at 3 L.  Chest x-ray without any infiltrates.  CTA negative for PE.  It was discussed with [facility] Dr. Lyla Glassing who recommended to splinter and nonweightbearing follow-up Dr. Kathaleen Bury in 7 days.  Assessment & Plan:   Principal Problem:   Hypoxia Active Problems:   Alcohol dependency ([place])   Acute respiratory failure (H[facility])  Acute on chronic hypoxic respiratory failure: She was severely hypoxic requiring BiPAP on arrival.  Now weaned down to HFNC 15L Etiology unknown.  Patient was evaluated by PCCM, initial impression was hepatopulmonary syndrome versus other shunt conditions.   Recommended echocardiogram and RUQ ultrasound. RUQ ultrasound: Single 6 mm gallstone without acute cholecystitis. Echo pending Patient also found to have polycythemia which could be causing chronic hypoxia. CTA chest ruled out pulmonary embolism.  Other differential diagnosis pulmonary hypertension. She is successfully weaned down to 3 L high flow nasal cannula sats 94%.  Wean as tolerated  Right ankle fracture: X-ray showed nondisplaced right  distal fibular fracture. Discussed with [facility] Dr. Lyla Glassing, recommended splint for now and nonweightbearing. Follow-up Dr. Kathaleen Bury in 7 days.  Hypokalemia: Replaced.  Continue to monitor  Polycythemia: Likely causing chronic hypoxia  Elevated lactic acid: Patient appears euvolemic on exam.  Assessed by PCCM, concerned about underlying liver disease.   Avoid aggressive hydration at this point. Chest x-ray no infiltrate, no urinary symptoms, monitor off antibiotics. Lactic acid normalized with IV hydration.  Sinus tachycardia: Patient appears euvolemic,  no further hydration. Continue low-dose metoprolol  Elevated troponin: Could be in the setting of hypoxia, denies any chest pain. Troponin trending down 30> 20.  EKG without ischemic changes Plan 2D echocardiogram  EtOH abuse: No signs and symptoms of active withdrawal.   Continue CIWA protocol. Patient was agitated restless trying to get out of bed, was given Ativan.   DVT prophylaxis: Lovenox. Code Status: Full code. Family Communication: No family at bed side. Disposition Plan:    Status is: Inpatient Remains inpatient appropriate because:  Admitted for acute on chronic hypoxic respiratory failure requiring BiPAP now weaned down to HFNC 3 L.  Patient continued on CIWA protocol for alcohol withdrawal.  Continue current plan pending improvement of respiratory status.  PT and OT pending   Consultants:  None  Procedures:  Echo, RUQ ultrasound Antimicrobials:   Anti-infectives (From admission, onward)    None        Subjective: Patient was seen and examined at bedside.  Overnight events noted. Patient was very agitated, restless trying to get out of bed, Patient was given Ativan as per CIWA protocol. Patient was very sleepy when examined at bedside.  Objective: Vitals:   07/08/21 2310 07/09/21 0450  07/09/21 0757 07/09/21 1000  BP: 135/87 128/77 (!) 155/81   Pulse: 66 66 (!) 59 62  Resp: '12 17 15 12   '$ Temp: 98.2 F (36.8 C) 97.7 F (36.5 C) 98 F (36.7 C)   TempSrc: Oral Oral Axillary   SpO2: 100% 99% 100% 98%  Weight:      Height:        Intake/Output Summary (Last 24 hours) at 07/09/2021 1111 Last data filed at 07/09/2021 0500 Gross per 24 hour  Intake 1000 ml  Output 550 ml  Net 450 ml   Filed Weights   07/08/21 1843  Weight: 59.1 kg    Examination:  General exam: Appears comfortable, not in any acute distress, deconditioned. Respiratory system: CTA bilaterally, no wheezing, no crackles, normal respiratory effort. Cardiovascular system: S1-S2 heard, regular rate and rhythm, no murmur. Gastrointestinal system: Abdomen is soft, non tender, non distended, BS+ Central nervous system: Sleepy, arousable, no focal neurological deficits. Extremities: No edema, no cyanosis, no clubbing. Skin: No rashes, lesions or ulcers Psychiatry: Not assessed.    Data Reviewed: I have personally reviewed following labs and imaging studies  CBC: Recent Labs  Lab 07/08/21 0932 07/08/21 0949 07/08/21 1000  WBC 10.7*  --   --   NEUTROABS 6.6  --   --   HGB 16.9* 16.3* 18.0*  HCT 48.3* 48.0* 53.0*  MCV 91.1  --   --   PLT 278  --   --    Basic Metabolic Panel: Recent Labs  Lab 07/08/21 0932 07/08/21 0949 07/08/21 1000 07/08/21 1132  NA 134* 131* 133*  --   K 3.3* 3.3* 3.2*  --   CL 98  --  99  --   CO2 20*  --   --   --   GLUCOSE 137*  --  133*  --   BUN 16  --  17  --   CREATININE 1.03*  --  0.70  --   CALCIUM 9.8  --   --   --   MG  --   --   --  1.9   GFR: Estimated Creatinine Clearance: 47.9 mL/min (by C-G formula based on SCr of 0.7 mg/dL). Liver Function Tests: Recent Labs  Lab 07/08/21 0932  AST 25  ALT 22  ALKPHOS 70  BILITOT 2.1*  PROT 7.3  ALBUMIN 3.6   No results for input(s): "LIPASE", "AMYLASE" in the last 168 hours. No results for input(s): "AMMONIA" in the last 168 hours. Coagulation Profile: Recent Labs  Lab 07/08/21 0932  INR 1.1    Cardiac Enzymes: No results for input(s): "CKTOTAL", "CKMB", "CKMBINDEX", "TROPONINI" in the last 168 hours. BNP (last 3 results) No results for input(s): "PROBNP" in the last 8760 hours. HbA1C: No results for input(s): "HGBA1C" in the last 72 hours. CBG: No results for input(s): "GLUCAP" in the last 168 hours. Lipid Profile: No results for input(s): "CHOL", "HDL", "LDLCALC", "TRIG", "CHOLHDL", "LDLDIRECT" in the last 72 hours. Thyroid Function Tests: Recent Labs    07/08/21 1803  TSH 1.324   Anemia Panel: No results for input(s): "VITAMINB12", "FOLATE", "FERRITIN", "TIBC", "IRON", "RETICCTPCT" in the last 72 hours. Sepsis Labs: Recent Labs  Lab 07/08/21 0932 07/08/21 1132 07/08/21 1450 07/08/21 1804  LATICACIDVEN 2.3* 4.5* 4.2* 1.5    Recent Results (from the past 240 hour(s))  Culture, blood (Routine x 2)     Status: None (Preliminary result)   Collection Time: 07/08/21  9:32 AM   Specimen: BLOOD  Result  Value Ref Range Status   Specimen Description BLOOD LEFT ANTECUBITAL  Final   Special Requests   Final    BOTTLES DRAWN AEROBIC AND ANAEROBIC Blood Culture results may not be optimal due to an inadequate volume of blood received in culture bottles   Culture   Final    NO GROWTH < 24 HOURS Performed at [facility] [address], [place], [place] [address]    Report Status PENDING  Incomplete  SARS Coronavirus 2 by RT PCR (hospital order, performed in [facility] hospital lab) *cepheid single result test* Anterior Nasal Swab     Status: None   Collection Time: 07/08/21  9:33 AM   Specimen: Anterior Nasal Swab  Result Value Ref Range Status   SARS Coronavirus 2 by RT PCR NEGATIVE NEGATIVE Final    Comment: (NOTE) SARS-CoV-2 target nucleic acids are NOT DETECTED.  The SARS-CoV-2 RNA is generally detectable in upper and lower respiratory specimens during the acute phase of infection. The lowest concentration of SARS-CoV-2 viral copies this assay can detect  is 250 copies / mL. A negative result does not preclude SARS-CoV-2 infection and should not be used as the sole basis for treatment or other patient management decisions.  A negative result may occur with improper specimen collection / handling, submission of specimen other than nasopharyngeal swab, presence of viral mutation(s) within the areas targeted by this assay, and inadequate number of viral copies (<250 copies / mL). A negative result must be combined with clinical observations, patient history, and epidemiological information.  Fact Sheet for Patients:   https://www.patel.info/  Fact Sheet for Healthcare Providers: https://hall.com/  This test is not yet approved or  cleared by the [place] FDA and has been authorized for detection and/or diagnosis of SARS-CoV-2 by FDA under an Emergency Use Authorization (EUA).  This EUA will remain in effect (meaning this test can be used) for the duration of the COVID-19 declaration under Section 564(b)(1) of the Act, 21 U.S.C. section 360bbb-3(b)(1), unless the authorization is terminated or revoked sooner.  Performed at [facility], [place] [address] [place], [place] [address]   Culture, blood (Routine x 2)     Status: None (Preliminary result)   Collection Time: 07/08/21 10:13 AM   Specimen: BLOOD RIGHT WRIST  Result Value Ref Range Status   Specimen Description BLOOD RIGHT WRIST  Final   Special Requests   Final    BOTTLES DRAWN AEROBIC AND ANAEROBIC Blood Culture results may not be optimal due to an inadequate volume of blood received in culture bottles   Culture   Final    NO GROWTH < 24 HOURS Performed at [facility], [place] [address] [place], [place] [address]    Report Status PENDING  Incomplete    Radiology Studies: US Abdomen Limited RUQ (LIVER/GB)  Result Date: 07/08/2021 CLINICAL DATA:  Cirrhosis. EXAM: ULTRASOUND ABDOMEN LIMITED RIGHT UPPER QUADRANT  COMPARISON:  03/07/2020 FINDINGS: Gallbladder: Single 6 mm stone with mild associated sludge. No gallbladder wall thickening or adjacent free fluid. Negative sonographic Murphy sign. Common bile duct: Diameter: 2.5 mm. Liver: No focal lesion identified. Within normal limits in parenchymal echogenicity. Portal vein is patent on color Doppler imaging with normal direction of blood flow towards the liver. Other: None. IMPRESSION: Single 6 mm gallstone with associated gallbladder sludge. No additional sonographic evidence to suggest acute cholecystitis. Electronically Signed   By: Marin Olp M.D.   On: 07/08/2021 13:32   DG Ankle Complete  Right  Result Date: 07/08/2021 CLINICAL DATA:  Fall with lateral right ankle pain. EXAM: RIGHT ANKLE - COMPLETE 3+ VIEW COMPARISON:  None Available. FINDINGS: Exam demonstrates a minimally displaced oblique fracture of the distal fibular metaphyseal region at the level of the ankle mortise. Ankle mortise is otherwise within normal. Minimal degenerative change of the hindfoot. Small inferior calcaneal spur. IMPRESSION: Minimally displaced oblique fracture of the distal fibular metaphysis. Electronically Signed   By: Marin Olp M.D.   On: 07/08/2021 12:53   CT Angio Chest PE W and/or Wo Contrast  Result Date: 07/08/2021 CLINICAL DATA:  81 year old female with suspected pulmonary embolism. EXAM: CT ANGIOGRAPHY CHEST WITH CONTRAST TECHNIQUE: Multidetector CT imaging of the chest was performed using the standard protocol during bolus administration of intravenous contrast. Multiplanar CT image reconstructions and MIPs were obtained to evaluate the vascular anatomy. RADIATION DOSE REDUCTION: This exam was performed according to the departmental dose-optimization program which includes automated exposure control, adjustment of the mA and/or kV according to patient size and/or use of iterative reconstruction technique. CONTRAST:  130m OMNIPAQUE IOHEXOL 350 MG/ML SOLN COMPARISON:   CTA of the chest 09/30/2019. FINDINGS: Cardiovascular: No filling defects within the pulmonary arterial tree to suggest pulmonary embolism. Heart size is normal. There is no significant pericardial fluid, thickening or pericardial calcification. Aortic atherosclerosis with ectasia of the ascending thoracic aorta (3.9 cm in diameter). No definite coronary artery calcifications. Mediastinum/Nodes: No pathologically enlarged mediastinal or hilar lymph nodes. Esophagus is unremarkable in appearance. No axillary lymphadenopathy. Lungs/Pleura: No acute consolidative airspace disease. No pleural effusions. No definite suspicious appearing pulmonary nodules or masses are noted. Upper Abdomen: Aortic atherosclerosis. Musculoskeletal: New compression fracture of T11 with 70% loss of anterior vertebral body height. Fracture lines are evident and there is a trace amount of paravertebral soft tissue swelling. Healing fracture of the posterolateral aspect of the right ninth rib. Old healed fracture of the posterolateral aspect of the right tenth rib. Review of the MIP images confirms the above findings. IMPRESSION: 1. No evidence of pulmonary embolism. 2. New, likely subacute compression fracture of T11 with 70% loss of anterior vertebral body height. 3. Aortic atherosclerosis with ectasia of the ascending thoracic aorta (3.9 cm in diameter. Aortic Atherosclerosis (ICD10-I70.0). Electronically Signed   By: DVinnie LangtonM.D.   On: 07/08/2021 10:54   DG Chest Portable 1 View  Result Date: 07/08/2021 CLINICAL DATA:  Hypoxia and shortness of breath since yesterday. Fall yesterday. EXAM: PORTABLE CHEST 1 VIEW COMPARISON:  05/03/2021 FINDINGS: Lungs are adequately inflated and otherwise clear. Cardiomediastinal silhouette is normal. Remainder of the exam is unremarkable. IMPRESSION: No active disease. Electronically Signed   By: DMarin OlpM.D.   On: 07/08/2021 10:29     Scheduled Meds:  aspirin EC  81 mg Oral Daily    enoxaparin (LOVENOX) injection  40 mg Subcutaneous Q24H   escitalopram  20 mg Oral Daily   folic acid  1 mg Oral Daily   metoprolol tartrate  25 mg Oral BID   multivitamin with minerals  1 tablet Oral Daily   OLANZapine  7.5 mg Oral QHS   pantoprazole  40 mg Oral Daily   thiamine  100 mg Oral Daily   Continuous Infusions:   LOS: 1 day    Time spent: 50 mins    Kyesha Balla, MD [facility]   If 7PM-7AM, please contact night-coverage

## 2021-07-09 NOTE — Progress Notes (Signed)
PCCM Interval Note  Chart reviewed.  Patient down to 3L [place] at 98%.    RUQ US> Single 6 mm gallstone with associated gallbladder sludge. No additional sonographic evidence to suggest acute cholecystitis.  Within normal limits in parenchymal echogenicity.    Bubble study still pending.    PCCM will see again after studies are back or sooner if needed.        Kennieth Rad, ACNP [facility] 07/09/2021, 11:42 AM  See Amion for pager If no response to pager, please call PCCM consult pager After 7:00 pm call Elink

## 2021-07-10 ENCOUNTER — Inpatient Hospital Stay (HOSPITAL_COMMUNITY): Payer: Medicare Other

## 2021-07-10 DIAGNOSIS — R0902 Hypoxemia: Secondary | ICD-10-CM

## 2021-07-10 LAB — COMPREHENSIVE METABOLIC PANEL
ALT: 21 U/L (ref 0–44)
AST: 23 U/L (ref 15–41)
Albumin: 2.9 g/dL — ABNORMAL LOW (ref 3.5–5.0)
Alkaline Phosphatase: 49 U/L (ref 38–126)
Anion gap: 7 (ref 5–15)
BUN: 15 mg/dL (ref 8–23)
CO2: 21 mmol/L — ABNORMAL LOW (ref 22–32)
Calcium: 9.2 mg/dL (ref 8.9–10.3)
Chloride: 108 mmol/L (ref 98–111)
Creatinine, Ser: 0.75 mg/dL (ref 0.44–1.00)
GFR, Estimated: >60 mL/min (ref >60)
Glucose, Bld: 85 mg/dL (ref 70–99)
Potassium: 3.5 mmol/L (ref 3.5–5.1)
Sodium: 136 mmol/L (ref 135–145)
Total Bilirubin: 0.7 mg/dL (ref 0.3–1.2)
Total Protein: 5.8 g/dL — ABNORMAL LOW (ref 6.5–8.1)

## 2021-07-10 LAB — CBC
HCT: 42.4 % (ref 36.0–46.0)
Hemoglobin: 14 g/dL (ref 12.0–15.0)
MCH: 31.4 pg (ref 26.0–34.0)
MCHC: 33 g/dL (ref 30.0–36.0)
MCV: 95.1 fL (ref 80.0–100.0)
Platelets: 235 10*3/uL (ref 150–400)
RBC: 4.46 MIL/uL (ref 3.87–5.11)
RDW: 13.5 % (ref 11.5–15.5)
WBC: 13 10*3/uL — ABNORMAL HIGH (ref 4.0–10.5)
nRBC: 0 % (ref 0.0–0.2)

## 2021-07-10 LAB — PHOSPHORUS: Phosphorus: 2.9 mg/dL (ref 2.5–4.6)

## 2021-07-10 LAB — MAGNESIUM: Magnesium: 1.9 mg/dL (ref 1.7–2.4)

## 2021-07-10 IMAGING — MR MR CARD MORPHOLOGY WO/W CM
45 of 48 series · 45 of 48 positions shown · IV contrast (Contrast agent)
Comparison: none

CLINICAL DATA: Congenital heart disease, known or suspected
Evaluation hypoxia notes positive bubble study. CI for BUZIN therefore
cmRI to evaluate for P:FO/ASD and associated anomalies.

EXAM:
CARDIAC MRI
TECHNIQUE: The patient was scanned on a 1.5 Tesla GE magnet. A dedicated
cardiac coil was used. Functional imaging was done using Fiest[employer]
sequences. [DATE], and 4 chamber views were done to assess for RWMA's.
Modified BUZIN rule using a short axis stack was used to
calculate an ejection fraction on a dedicated work station using
Circle software. The patient received 7mL GADAVIST GADOBUTROL 1
MMOL/ML IV SOLN. After 10 minutes inversion recovery sequences were
used to assess for infiltration and scar tissue.

[Series 4: [id number]_bh · axial · 8.0mm · 1.41mm/px · 1 of 16 slices shown]
[im 1/16]
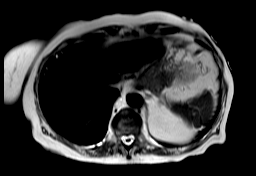

[Series 9: bSSFP · oblique · 8.0mm · 1.61mm/px · 1 of 25 slices shown (1 of 21)]
[im 1/25]
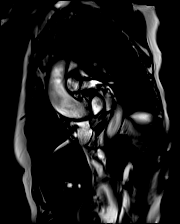

[Series 10: bSSFP · oblique · 8.0mm · 1.61mm/px · 1 of 25 slices shown (2 of 21)]
[im 1/25]
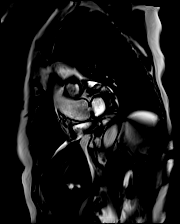

[Series 11: bSSFP · oblique · 8.0mm · 1.61mm/px · 1 of 25 slices shown (3 of 21)]
[im 1/25]
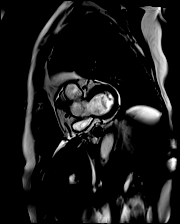

[Series 12: bSSFP · oblique · 8.0mm · 1.61mm/px · 1 of 25 slices shown (4 of 21)]
[im 1/25]
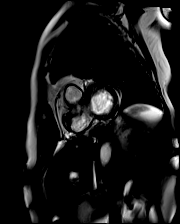

[Series 13: bSSFP · oblique · 8.0mm · 1.61mm/px · 1 of 25 slices shown (5 of 21)]
[im 1/25]
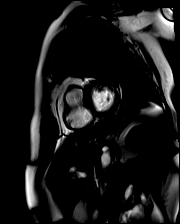

[Series 14: bSSFP · oblique · 8.0mm · 1.61mm/px · 1 of 25 slices shown (6 of 21)]
[im 1/25]
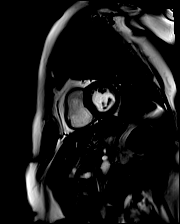

[Series 15: bSSFP · oblique · 8.0mm · 1.61mm/px · 1 of 25 slices shown (7 of 21)]
[im 1/25]
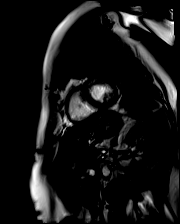

[Series 16: bSSFP · oblique · 8.0mm · 1.61mm/px · 1 of 25 slices shown (8 of 21)]
[im 1/25]
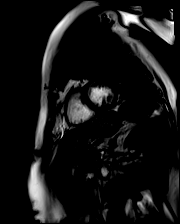

[Series 17: bSSFP · oblique · 8.0mm · 1.61mm/px · 1 of 25 slices shown (9 of 21)]
[im 1/25]
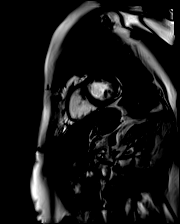

[Series 18: bSSFP · oblique · 8.0mm · 1.61mm/px · 1 of 25 slices shown (10 of 21)]
[im 1/25]
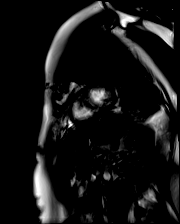

[Series 19: bSSFP · oblique · 8.0mm · 1.61mm/px · 1 of 25 slices shown (11 of 21)]
[im 1/25]
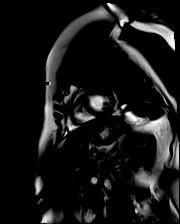

[Series 20: bSSFP · oblique · 8.0mm · 1.61mm/px · 1 of 25 slices shown (12 of 21)]
[im 1/25]
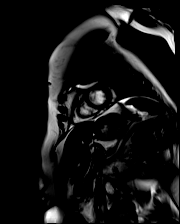

[Series 21: bSSFP · oblique · 8.0mm · 1.61mm/px · 1 of 25 slices shown (13 of 21)]
[im 1/25]
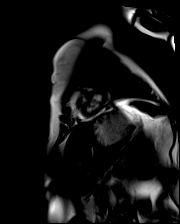

[Series 22: bSSFP · oblique · 8.0mm · 1.61mm/px · 1 of 25 slices shown (14 of 21)]
[im 1/25]
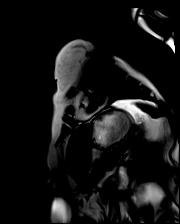

[Series 23: bSSFP · oblique · 8.0mm · 1.61mm/px · 1 of 25 slices shown (15 of 21)]
[im 1/25]
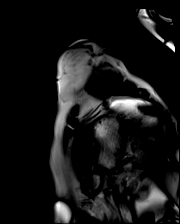

[Series 24: bSSFP · axial · 6.0mm · 1.41mm/px · 1 of 25 slices shown (16 of 21)]
[im 1/25]
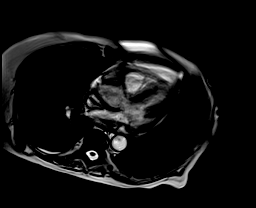

[Series 25: bSSFP · coronal · 6.0mm · 1.41mm/px · 1 of 25 slices shown (17 of 21)]
[im 1/25]
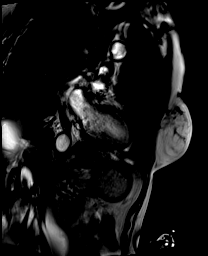

[Series 26: bSSFP · oblique · 6.0mm · 1.41mm/px · 1 of 25 slices shown (18 of 21)]
[im 1/25]
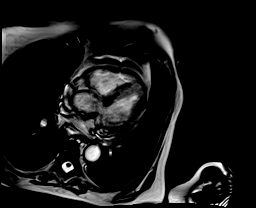

[Series 27: bSSFP · oblique · 6.0mm · 1.41mm/px · 1 of 25 slices shown (19 of 21)]
[im 1/25]
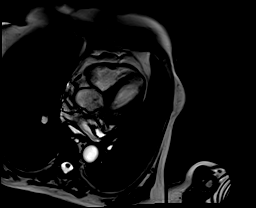

[Series 28: bSSFP · axial · 6.0mm · 1.41mm/px · 1 of 25 slices shown (20 of 21)]
[im 1/25]
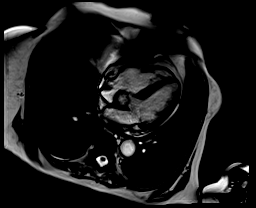

[Series 29: bSSFP · axial · 6.0mm · 1.41mm/px · 1 of 25 slices shown (21 of 21)]
[im 1/25]
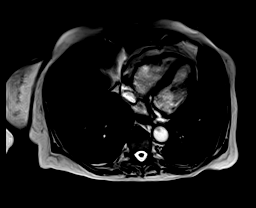

[Series 30: (id)_long_t1 · oblique · 8.0mm · 1.56mm/px · 1 of 24 slices shown]
[im 1/24]
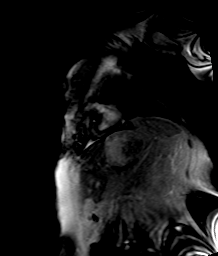

[Series 31: (id)_long_t1_moco · oblique · 8.0mm · 1.56mm/px · 1 of 24 slices shown]
[im 1/24]
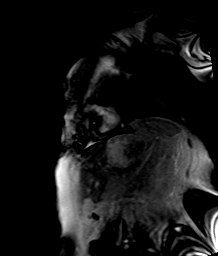

[Series 32: (id)_long_t1_moco_t1 · oblique · 8.0mm · 1.56mm/px · 1 of 3 slices shown]
[im 1/3]
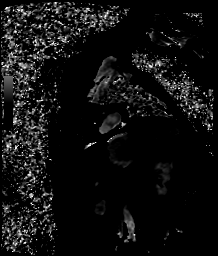

[Series 34: (id)_trufi · oblique · 8.0mm · 2.08mm/px · 1 of 9 slices shown]
[im 1/9]
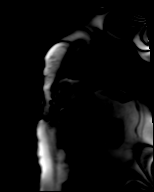

[Series 35: (id)_trufi_moco · oblique · 8.0mm · 2.08mm/px · 1 of 9 slices shown]
[im 1/9]
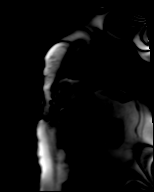

[Series 36: (id)_trufi_moco_t2 · oblique · 8.0mm · 2.08mm/px · 1 of 3 slices shown]
[im 1/3]
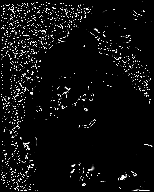

[Series 38: cine_trufi_cs_rt_short axis · oblique · 8.0mm · 1.73mm/px · 1 of 15 slices shown (1 of 17)]
[im 1/15]
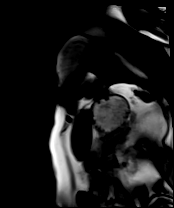

[Series 38: cine_trufi_cs_rt_short axis · oblique · 8.0mm · 1.73mm/px · 1 of 15 slices shown (2 of 17)]
[im 1/15]
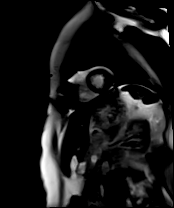

[Series 38: cine_trufi_cs_rt_short axis · oblique · 8.0mm · 1.73mm/px · 1 of 15 slices shown (3 of 17)]
[im 1/15]
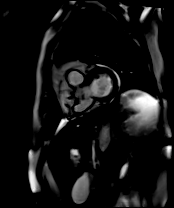

[Series 38: cine_trufi_cs_rt_short axis · oblique · 8.0mm · 1.73mm/px · 1 of 15 slices shown (4 of 17)]
[im 1/15]
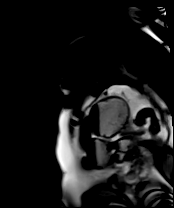

[Series 38: cine_trufi_cs_rt_short axis · oblique · 8.0mm · 1.73mm/px · 1 of 15 slices shown (5 of 17)]
[im 1/15]
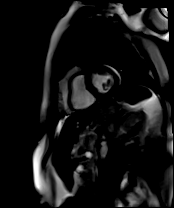

[Series 38: cine_trufi_cs_rt_short axis · oblique · 8.0mm · 1.73mm/px · 1 of 15 slices shown (6 of 17)]
[im 1/15]
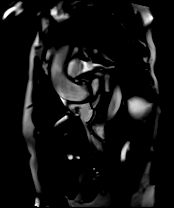

[Series 38: cine_trufi_cs_rt_short axis · oblique · 8.0mm · 1.73mm/px · 1 of 15 slices shown (7 of 17)]
[im 1/15]
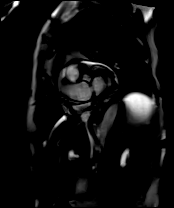

[Series 38: cine_trufi_cs_rt_short axis · oblique · 8.0mm · 1.73mm/px · 1 of 15 slices shown (8 of 17)]
[im 1/15]
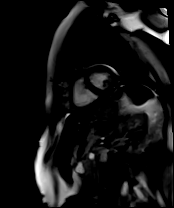

[Series 38: cine_trufi_cs_rt_short axis · oblique · 8.0mm · 1.73mm/px · 1 of 15 slices shown (9 of 17)]
[im 1/15]
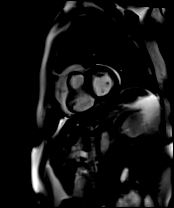

[Series 38: cine_trufi_cs_rt_short axis · oblique · 8.0mm · 1.73mm/px · 1 of 15 slices shown (10 of 17)]
[im 1/15]
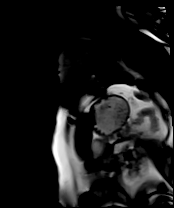

[Series 38: cine_trufi_cs_rt_short axis · oblique · 8.0mm · 1.73mm/px · 1 of 15 slices shown (11 of 17)]
[im 1/15]
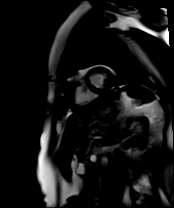

[Series 38: cine_trufi_cs_rt_short axis · oblique · 8.0mm · 1.73mm/px · 1 of 15 slices shown (12 of 17)]
[im 1/15]
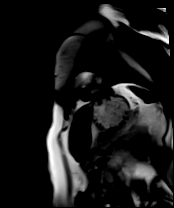

[Series 38: cine_trufi_cs_rt_short axis · oblique · 8.0mm · 1.73mm/px · 1 of 15 slices shown (13 of 17)]
[im 1/15]
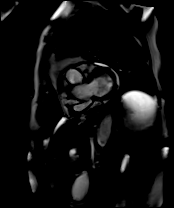

[Series 38: cine_trufi_cs_rt_short axis · oblique · 8.0mm · 1.73mm/px · 1 of 15 slices shown (14 of 17)]
[im 1/15]
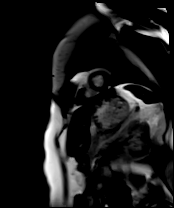

[Series 39: cine_trufi_cs_rt_short axis · axial · 8.0mm · 1.73mm/px · 1 of 15 slices shown (15 of 17)]
[im 1/15]
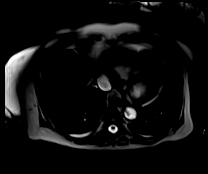

[Series 40: cine_trufi_cs_rt_short axis · axial · 8.0mm · 1.73mm/px · 1 of 15 slices shown (16 of 17)]
[im 1/15]
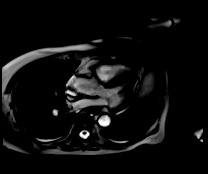

[Series 41: cine_trufi_cs_rt_short axis · coronal · 8.0mm · 1.73mm/px · 1 of 15 slices shown (17 of 17)]
[im 1/15]
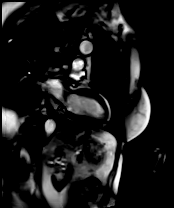

[45 of 48 positions shown; findings below may reference images not displayed]

This examination is tailored for evaluation cardiac anatomy and
function and provides very limited assessment of noncardiac
structures, which are accordingly not evaluated during
interpretation. If there is clinical concern for extracardiac
pathology, further evaluation with CT imaging should be considered.
FINDINGS: LEFT VENTRICLE:

Normal left ventricular chamber size.

Normal left ventricular wall thickness.

Normal left ventricular systolic function.

LVEF = 50%, visually appears 55-60%. Quantitation may be impacted by
motion artifact.

There are no regional wall motion abnormalities.

No myocardial edema, T2 = 54 msec, upper limit of scanner normal

Normal first pass perfusion.

There is a small focus of post contrast delayed myocardial
enhancement, inferobasal that is midmyocardial and focal. This is
nonspecific and in one segment. Undetermined significance.

Normal T1 myocardial nulling kinetics suggest against a diagnosis of
cardiac amyloidosis.

ECV = 30%, nonspecific elevation

RIGHT VENTRICLE:

Normal right ventricular chamber size.

Normal right ventricular wall thickness.

Normal right ventricular systolic function.

RVEF = 51%

There are no regional wall motion abnormalities.

No post contrast delayed myocardial enhancement.

ATRIA:

Normal left atrial size.

Normal right atrial size.

VALVES:

No significant valvular abnormalities.  Tricuspid aortic valve.

PERICARDIUM:

Normal pericardium.  Small pericardial effusion.

OTHER: No significant extracardiac findings.

MEASUREMENTS:
Qp/Qs:

Left ventricle:

LV Female

LV EF: 50% (Normal 56-78%)

Absolute volumes:

LV EDV: 79mL (Normal 52-141 mL)

LV ESV: 40mL (Normal 13-51 mL)

LV SV: 40mL (Normal 33-97 mL)

CO: 2.4L/min (Normal 2.7-6.0 L/min)

Indexed volumes:

LV EDV: 49mL/sq-m (Normal 41-81 mL/sq-m)

LV ESV: 24mL/sq-m (Normal 12-21 mL/sq-m)

LV SV: 25mL/sq-m (Normal 26-56 mL/sq-m)

CI: 1.47L/min/sq-m (Normal 1.8-3.8 L/min/sq-m)

Right ventricle:

RV female

RV EF: 51% (Normal 47-80%)

Absolute volumes:

RV EDV: 73 mL (Normal 58-154 mL)

RV ESV: 36 mL (Normal 12-68 mL)

RV SV: 37 mL (Normal 35-98 mL)

CO: 2.2 L/min (Normal 2.7-6 L/min)

Indexed volumes:

RV EDV: 45 ML/sq-m (Normal 48-87 mL/sq-m)

RV ESV: 22 mL/sq-m (Normal 11-28 mL/sq-m)

RV SV: 23 mL/sq-m (Normal 27-57 mL/sq-m)

CI: 1.38 L/min/sq-m (Normal 1.8-3.8 L/min/sq-m)
IMPRESSION: 1.  Normal biventricular chamber size and function.

2.  Qp/Qs is 0.93.

3. No ASD seen. No definite anomalous pulmonary venous return. No
findings to suggest congenital heart lesions or intracardiac shunt.

## 2021-07-10 MED ORDER — GADOBUTROL 1 MMOL/ML IV SOLN
7.0000 mL | Freq: Once | INTRAVENOUS | Status: AC | PRN
  Administered 2021-07-10: 7 mL via INTRAVENOUS

## 2021-07-10 MED ORDER — HALOPERIDOL 1 MG PO TABS: 2.0000 mg | ORAL_TABLET | Freq: Two times a day (BID) | ORAL | Status: DC | PRN

## 2021-07-10 NOTE — Consult Note (Signed)
Referring Provider: [facility] Primary Care Physician:  Colon Branch, MD Primary Gastroenterologist:  previous patient of Dr. Cristina Gong  Reason for Consultation: Chronic dysphagia  HPI: Monique Ballard is a 81 y.o. female female with medical history significant of chronic hypoxic respiratory failure noncoherent to home O2, alcohol dependence, breast cancer status post lumpectomy and agent therapy, GERD, anxiety/depression, presents for evaluation of chronic dysphagia  Patient originally presented to emergency department with hypoxia.  Reported exertional dyspnea for the last 2 to 3 days.  GI is consulted for chronic dysphagia ongoing for the last 20 years.  Patient did have an endoscopy in 2004 by Dr. Cristina Gong, at which time widely patent Schatzki's ring was seen.  Cardiology planning to do TEE for evaluation of shortness of breath. Patient states she has no issues swallowing solids and liquids. Reports dysphagia with "large pills." Denies other GI issues.  Past Medical History:  Diagnosis Date   Anxiety    Breast cancer ([place]) fall 1997    Left Lumpectomy, XRT.  treated with Tamoxifem and Evista   Depression    Endometriosis in her 20's   surgery to help clear endometriosis to obtain a pregnancy.   GERD (gastroesophageal reflux disease)    w/"stretching" remotely   HTN (hypertension)    Migraines     Past Surgical History:  Procedure Laterality Date   BREAST LUMPECTOMY Left fall 1997   Lymph nodes X 3 were negative, ER+, Radiation treatmetn.  Took Tamoxifem X 5 yrs then Evista for 3-5 yrs.   CESAREAN SECTION     x2  first pregnancy breach and second normal   surgery for endometriosis      Prior to Admission medications   Medication Sig Start Date End Date Taking? Authorizing Provider  aspirin EC 81 MG tablet Take 1 tablet (81 mg total) by mouth daily. Swallow whole. 12/27/19  Yes Melvenia Beam, MD  benzonatate (TESSALON) 100 MG capsule Take 100 mg by mouth 3 (three) times daily as  needed for cough.   Yes [provider]  escitalopram (LEXAPRO) 20 MG tablet TAKE ONE TABLET BY MOUTH ONE TIME DAILY **Schedule appt** Patient taking differently: Take 20 mg by mouth daily. 04/18/20  Yes Cottle, Billey Co., MD  folic acid (FOLVITE) 1 MG tablet Take 1 tablet (1 mg total) by mouth daily. 07/25/20  Yes Paz, Jacqulyn Bath E, MD  OLANZapine (ZYPREXA) 7.5 MG tablet TAKE ONE TABLET BY MOUTH DAILY AT BEDTIME Patient taking differently: Take 7.5 mg by mouth at bedtime. 12/20/19  Yes Cottle, Billey Co., MD  pantoprazole (PROTONIX) 40 MG tablet Take 1 tablet (40 mg total) by mouth daily. 07/25/20  Yes Paz, Alda Berthold, MD  thiamine 100 MG tablet Take 1 tablet (100 mg total) by mouth daily. 12/02/19  Yes Paz, Alda Berthold, MD  alendronate (FOSAMAX) 70 MG tablet Take 1 tablet (70 mg total) by mouth every 7 (seven) days. Take with a full glass of water on an empty stomach. Patient not taking: Reported on 07/08/2021 05/15/21   Colon Branch, MD    Scheduled Meds:  aspirin EC  81 mg Oral Daily   enoxaparin (LOVENOX) injection  40 mg Subcutaneous Q24H   escitalopram  20 mg Oral Daily   folic acid  1 mg Oral Daily   metoprolol tartrate  25 mg Oral BID   multivitamin with minerals  1 tablet Oral Daily   pantoprazole  40 mg Oral Daily   Continuous Infusions:  thiamine injection 500  mg (07/10/21 0435)   PRN Meds:.acetaminophen, haloperidol, LORazepam **OR** LORazepam  Allergies as of 07/08/2021 - Review Complete 07/08/2021  Allergen Reaction Noted   Penicillins Rash 02/17/2006    Family History  Problem Relation Age of Onset   Hypertension Mother    Stroke Mother    Heart attack Father        elderly   Heart disease Father        CHF   Throat cancer Other    Breast cancer Neg Hx    Colon cancer Neg Hx    Diabetes Neg Hx    Neuropathy Neg Hx     Social History   Socioeconomic History   Marital status: Married    Spouse name: Not on file   Number of children: 2   Years of education: Not  on file   Highest education level: Bachelor's degree (e.g., BA, AB, BS)  Occupational History   Occupation: retired  Tobacco Use   Smoking status: Former    Packs/day: 0.50    Years: 25.00    Total pack years: 12.50    Types: Cigarettes    Quit date: 1990    Years since quitting: 33.4   Smokeless tobacco: Never  Vaping Use   Vaping Use: Never used  Substance and Sexual Activity   Alcohol use: Yes    Alcohol/week: 7.0 standard drinks of alcohol    Types: 7 Glasses of wine per week    Comment: 1 glass wine dialy   Drug use: No   Sexual activity: Never    Birth control/protection: None  Other Topics Concern   Not on file  Social History Narrative   Lives w/ husband and son   1 Older Brother passed away   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   Son lives with her as of 12/2016    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, she is a Community education officer, lives in [place], has 2 children   Caffeine: 1 cup coffee daily      Social Determinants of Health   Financial Resource Strain: Low Risk  (05/01/2021)   Overall Financial Resource Strain (CARDIA)    Difficulty of Paying Living Expenses: Not hard at all  Food Insecurity: No Food Insecurity (05/01/2021)   Hunger Vital Sign    Worried About Running Out of Food in the Last Year: Never true    [place] in the Last Year: Never true  Transportation Needs: No Transportation Needs (05/01/2021)   PRAPARE - Hydrologist (Medical): No    Lack of Transportation (Non-Medical): No  Physical Activity: Inactive (05/01/2021)   Exercise Vital Sign    Days of Exercise per Week: 0 days    Minutes of Exercise per Session: 0 min  Stress: No Stress Concern Present (05/01/2021)   [place]    Feeling of Stress : Not at all  Social Connections: [place] (05/01/2021)   Social Connection and Isolation Panel [NHANES]    Frequency of  Communication with Friends and Family: More than three times a week    Frequency of Social Gatherings with Friends and Family: More than three times a week    Attends Religious Services: More than 4 times per year    Active Member of [employer] or Organizations: Yes    Attends Music therapist: More than 4 times per year    Marital Status: Married  Human resources officer  Violence: Not At Risk (05/01/2021)   Humiliation, Afraid, Rape, and Kick questionnaire    Fear of Current or Ex-Partner: No    Emotionally Abused: No    Physically Abused: No    Sexually Abused: No    Review of Systems: Review of Systems  Constitutional:  Negative for chills and fever.  HENT:  Negative for hearing loss and tinnitus.   Eyes:  Negative for blurred vision and double vision.  Respiratory:  Negative for cough and hemoptysis.   Cardiovascular:  Negative for chest pain and palpitations.  Gastrointestinal:  Negative for abdominal pain, blood in stool, constipation, diarrhea, heartburn, melena, nausea and vomiting.       Dysphagia to pills  Genitourinary:  Negative for dysuria and urgency.  Musculoskeletal:  Negative for myalgias and neck pain.  Skin:  Negative for itching and rash.  Neurological:  Negative for dizziness and headaches.  Psychiatric/Behavioral:  Negative for depression and suicidal ideas.      Physical Exam:Physical Exam Constitutional:      Appearance: She is well-developed.  HENT:     Head: Normocephalic and atraumatic.     Nose: Nose normal. No congestion.     Mouth/Throat:     Mouth: Mucous membranes are moist.     Pharynx: Oropharynx is clear.  Eyes:     Extraocular Movements: Extraocular movements intact.     Conjunctiva/sclera: Conjunctivae normal.  Cardiovascular:     Rate and Rhythm: Normal rate and regular rhythm.  Pulmonary:     Effort: Pulmonary effort is normal. No respiratory distress.  Abdominal:     General: Bowel sounds are normal. There is no distension.      Palpations: Abdomen is soft. There is no mass.     Tenderness: There is no abdominal tenderness. There is no guarding or rebound.     Hernia: No hernia is present.  Musculoskeletal:        General: No swelling. Normal range of motion.     Cervical back: Normal range of motion and neck supple.  Skin:    General: Skin is warm and dry.  Neurological:     General: No focal deficit present.     Mental Status: She is oriented to person, place, and time.  Psychiatric:        Mood and Affect: Mood normal.        Behavior: Behavior normal.        Thought Content: Thought content normal.        Judgment: Judgment normal.     Vital signs: Vitals:   07/10/21 0430 07/10/21 0800  BP: (!) 98/57 95/63  Pulse: (!) 55 76  Resp: 14 16  Temp: 97.9 F (36.6 C) 97.6 F (36.4 C)  SpO2: 100% 94%   Last BM Date : 07/07/21    GI:  Lab Results: Recent Labs    07/08/21 0932 07/08/21 0949 07/08/21 1000 07/10/21 0257  WBC 10.7*  --   --  13.0*  HGB 16.9* 16.3* 18.0* 14.0  HCT 48.3* 48.0* 53.0* 42.4  PLT 278  --   --  235   BMET Recent Labs    07/08/21 0932 07/08/21 0949 07/08/21 1000 07/10/21 0257  NA 134* 131* 133* 136  K 3.3* 3.3* 3.2* 3.5  CL 98  --  99 108  CO2 20*  --   --  21*  GLUCOSE 137*  --  133* 85  BUN 16  --  17 15  CREATININE 1.03*  --  0.70 0.75  CALCIUM 9.8  --   --  9.2   LFT Recent Labs    07/10/21 0257  PROT 5.8*  ALBUMIN 2.9*  AST 23  ALT 21  ALKPHOS 49  BILITOT 0.7   PT/INR Recent Labs    07/08/21 0932  LABPROT 14.1  INR 1.1     Studies/Results: ECHOCARDIOGRAM COMPLETE BUBBLE STUDY  Result Date: 07/09/2021    ECHOCARDIOGRAM REPORT   Patient Name:   GENEVIVE PRINTUP Date of Exam: 07/09/2021 Medical Rec #:  [id number]       Height:       63.0 in Accession #:    [id number]      Weight:       130.3 lb Date of Birth:  [birth year]      BSA:          1.612 m Patient Age:    26 years        BP:           132/82 mmHg Patient Gender: F               HR:            77 bpm. Exam Location:  Inpatient Procedure: 2D Echo, Cardiac Doppler, Color Doppler and Saline Contrast Bubble            Study Indications:    Respiratory failure  History:        Patient has no prior history of Echocardiogram examinations.                 Signs/Symptoms:Dyspnea. Hypoxia, alcohol dependency, GERD, hx                 breast cancer.  Sonographer:    Clayton Lefort RDCS (AE) Referring Phys: [place] Comments: Suboptimal subcostal window. IMPRESSIONS  1. Left ventricular ejection fraction, by estimation, is 55 to 60%. The left ventricle has normal function. The left ventricle has no regional wall motion abnormalities. Left ventricular diastolic parameters are consistent with Grade I diastolic dysfunction (impaired relaxation).  2. Right ventricular systolic function is normal. The right ventricular size is normal.  3. The mitral valve is normal in structure. No evidence of mitral valve regurgitation. No evidence of mitral stenosis.  4. The aortic valve is tricuspid. There is mild calcification of the aortic valve. Aortic valve regurgitation is not visualized. No aortic stenosis is present.  5. The inferior vena cava is normal in size with greater than 50% respiratory variability, suggesting right atrial pressure of 3 mmHg.  6. Agitated saline contrast bubble study was positive with shunting observed within 3-6 cardiac cycles suggestive of interatrial shunt. FINDINGS  Left Ventricle: Left ventricular ejection fraction, by estimation, is 55 to 60%. The left ventricle has normal function. The left ventricle has no regional wall motion abnormalities. The left ventricular internal cavity size was normal in size. There is  no left ventricular hypertrophy. Left ventricular diastolic parameters are consistent with Grade I diastolic dysfunction (impaired relaxation). Right Ventricle: The right ventricular size is normal. No increase in right ventricular wall thickness. Right  ventricular systolic function is normal. Left Atrium: Left atrial size was normal in size. Right Atrium: Right atrial size was normal in size. Pericardium: There is no evidence of pericardial effusion. Mitral Valve: The mitral valve is normal in structure. No evidence of mitral valve regurgitation. No evidence of mitral valve stenosis. Tricuspid Valve: The tricuspid valve is normal in structure. Tricuspid  valve regurgitation is not demonstrated. No evidence of tricuspid stenosis. Aortic Valve: The aortic valve is tricuspid. There is mild calcification of the aortic valve. Aortic valve regurgitation is not visualized. No aortic stenosis is present. Aortic valve mean gradient measures 2.0 mmHg. Aortic valve peak gradient measures 2.5 mmHg. Aortic valve area, by VTI measures 3.33 cm. Pulmonic Valve: The pulmonic valve was normal in structure. Pulmonic valve regurgitation is not visualized. No evidence of pulmonic stenosis. Aorta: The aortic root is normal in size and structure. Venous: The inferior vena cava is normal in size with greater than 50% respiratory variability, suggesting right atrial pressure of 3 mmHg. IAS/Shunts: No atrial level shunt detected by color flow Doppler. Agitated saline contrast was given intravenously to evaluate for intracardiac shunting. Agitated saline contrast bubble study was positive with shunting observed within 3-6 cardiac cycles suggestive of interatrial shunt.  LEFT VENTRICLE PLAX 2D LVIDd:         4.40 cm   Diastology LVIDs:         3.60 cm   LV e' medial:    4.43 cm/s LV PW:         0.80 cm   LV E/e' medial:  7.2 LV IVS:        1.20 cm   LV e' lateral:   7.46 cm/s LVOT diam:     2.30 cm   LV E/e' lateral: 4.2 LV SV:         55 LV SV Index:   34 LVOT Area:     4.15 cm  RIGHT VENTRICLE RV Basal diam:  2.50 cm RV S prime:     9.57 cm/s TAPSE (M-mode): 1.8 cm LEFT ATRIUM           Index        RIGHT ATRIUM          Index LA diam:      2.50 cm 1.55 cm/m   RA Area:     8.76 cm LA  Vol (A2C): 34.0 ml 21.10 ml/m  RA Volume:   15.00 ml 9.31 ml/m LA Vol (A4C): 15.9 ml 9.87 ml/m  AORTIC VALVE AV Area (Vmax):    3.45 cm AV Area (Vmean):   3.13 cm AV Area (VTI):     3.33 cm AV Vmax:           78.30 cm/s AV Vmean:          59.200 cm/s AV VTI:            0.166 m AV Peak Grad:      2.5 mmHg AV Mean Grad:      2.0 mmHg LVOT Vmax:         65.00 cm/s LVOT Vmean:        44.600 cm/s LVOT VTI:          0.133 m LVOT/AV VTI ratio: 0.80  AORTA Ao Root diam: 3.20 cm Ao Asc diam:  3.60 cm MITRAL VALVE MV Area (PHT): 5.31 cm    SHUNTS MV Decel Time: 143 msec    Systemic VTI:  0.13 m MV E velocity: 31.70 cm/s  Systemic Diam: 2.30 cm MV A velocity: 98.50 cm/s MV E/A ratio:  0.32 Glori Bickers MD Electronically signed by Glori Bickers MD Signature Date/Time: 07/09/2021/3:36:01 PM    Final     Impression: Chronic dysphagia -Normal LFTs -Normal renal function -Hgb 14.0 - Esophagram shows cervical stricture, tablet unable to pass.  Plan: Esophagram with cervical stricture, dysphagia to  pills. Requires GI clearance prior to TEE. Recommend EGD with dilation. Will discuss timing of EGD with Dr. Paulita Fujita Continue supportive care [facility] will follow.   LOS: 2 days   Ramces Shomaker Radford Pax  PA-C 07/10/2021, 1:34 PM  Contact #  [phone]

## 2021-07-10 NOTE — Consult Note (Addendum)
[facility] Followup Face-to-Face Psychiatric Evaluation   Service Date: July 10, 2021 LOS:  LOS: 2 days    Assessment  Monique Ballard is a 81 y.o. female admitted medically for 07/08/2021  9:14 AM for hypoxia. She carries the psychiatric diagnoses of Depression and Anxiety and has a past medical history of  chronic hypoxic respiratory failure, noncompliant with home oxygen, alcohol dependence, breast cancer status postlumpectomy and chemotherapy, and GERD.Psychiatry was consulted for agitation and hallucinations by Shawna Clamp, MD.    Her initial presentation is most consistent with delirium. She meets criteria for multifactorial delirium based on patient's sudden change in behavior with agitation and having multiple possible etiologies including advanced age, change in environment, possible Etoh withdrawal, and hypoxia.  Current outpatient psychotropic medications include Lexapro '20mg'$  and historically she has had a good response to these medications. She was  compliant with medications prior to admission as evidenced by patient endorsing. On initial examination, patient was sedated due Ativan. Please see plan below for detailed recommendations.   6/20- Patient appears back to baseline today. Patient was noted to have her daughter Monique Ballard in the room again. Daughter feels that patient is back to herself. Patient slept through the night, is eating well and has no dysphoria or significant anxiety. Patient is not having continued behavior issues and is asking appropriate questions regarding her healthcare. Patient did not require additional Ativan. Patient is also likely benefiting from thiamine '500mg'$  IV q8h  Diagnoses:  Active Hospital problems: Principal Problem:   Hypoxia Active Problems:   Alcohol dependency ([place])   Acute respiratory failure ([place])     Plan  ## Safety and Observation Level:  - Based on my clinical evaluation, I estimate the patient to be at low risk of  self harm in the current setting - At this time, we recommend a routine level of observation. This decision is based on my review of the chart including patient's history and current presentation, interview of the patient, mental status examination, and consideration of suicide risk including evaluating suicidal ideation, plan, intent, suicidal or self-harm behaviors, risk factors, and protective factors. This judgment is based on our ability to directly address suicide risk, implement suicide prevention strategies and develop a safety plan while the patient is in the clinical setting. Please contact our team if there is a concern that risk level has changed.   ## Medications:  -- Continue home Lexapro '20mg'$  daily -- Change Haldol to '2mg'$  PRN BID -- Continue thiamine to '500mg'$  q8h IV, thiamine deficiency  -- Recommend changing Ativan 1-'4mg'$  PRN q 1h to 0.'5mg'$  q8h PRN -- Continue CIWA  ## Medical Decision Making Capacity:  Not formally assessed  ## Further Work-up:  -- Per primary   -- most recent EKG on 6/18 had QtC of 497 w/ Sinus tachy of 110 but JT is 271 which is appropriate -- Pertinent labwork reviewed earlier this admission includes: ABG: pO2 37/ ph 7.483/HCO3 15.6,  CMP- Na 133>136/K 3.2>3.5/Hgb 18>14/, LA 4.5,TSH- WNL  ## Disposition:  -- Per primary  ## Behavioral / Environmental:  -- DELIRIUM RECS 1: Avoid  unnecessary benzodiazepines, antihistamines, anticholinergics, and minimize opiate use as these may worsen delirium. 2:Assess, prevent and manage pain as lack of treatment can result in delirium.  3: Recommend consult to PT/OT if not already done. Early mobility and exercise has been shown to decrease duration of delirium.  4:Provide appropriate lighting and clear signage; a clock and calendar should be easily visible to the  patient. 5:Monitor environmental factors. Reduce light and noise at night (close shades, turn off lights, turn off TV, ect). Correct any alterations in  sleep cycle. 6: Reorient the patient to person, place, time and situation on each encounter.  7: Correct sensory deficits if possible (replace eye glasses, hearing aids, ect). 8: Avoid restraints. Severely delirious patients benefit from constant observation by a sitter. 9: Do not leave patient unattended.     ##Legal Status   Thank you for this consult request. Recommendations have been communicated to the primary team.  We will continue to follow at this time.    PGY-2 Freida Busman, MD   followup history  Relevant Aspects of Hospital Course:  Admitted on 07/08/2021 for hypoxia.  Patient Report:  Patient awake and oriented this AM. Patient's daughter is also in the room (patient was ok with her staying). Per both patient slept well last night and her appetite is coming back. Patient reports that her daughter has talked to her about psychiatry starting Haldol and concern for Etoh withdrawal. Today, patient and provider discussed patient's possible Etoh withdrawal contributing to her agitated behavior and hallucinations. Patient endorsed that she is only drinking 2 glasses of wine a day and provider educated that even this was too much for a 81 yo female. Patient endorsed understanding.   Patient reported that otherwise she does not have dysphoric mood or significant anxiety. Patient denies anhedonia, feelings of guilt/hopelessness/ worthlessness. Patient also denied feeling overly anxious and did not screen positive for generalized anxiety. Patient endorsed that she feels that her Lexapro has been beneficial to alleviating these symptoms that were worse last year. Patient reports she wishes to continue her medication.   Patient denies SI, HI, and AVH and wishes to return home, but  understands that she still has further workup to be done.   Collateral information:  Daughter in the room and provided PPH.   Psychiatric History:  Information collected from patient and family - Saw Dr.  Clovis Pu until 11/2020 when she was felt stable enough for d'c  Family psych history: Daughter is on medication   Social History:     Alcohol use: "2 glasses of wine a day"   Family History:   The patient's family history includes Heart attack in her father; Heart disease in her father; Hypertension in her mother; Stroke in her mother; Throat cancer in an other family member.  Medical History: Past Medical History:  Diagnosis Date   Anxiety    Breast cancer ([place]) fall 1997    Left Lumpectomy, XRT.  treated with Tamoxifem and Evista   Depression    Endometriosis in her 20's   surgery to help clear endometriosis to obtain a pregnancy.   GERD (gastroesophageal reflux disease)    w/"stretching" remotely   HTN (hypertension)    Migraines     Surgical History: Past Surgical History:  Procedure Laterality Date   BREAST LUMPECTOMY Left fall 1997   Lymph nodes X 3 were negative, ER+, Radiation treatmetn.  Took Tamoxifem X 5 yrs then Evista for 3-5 yrs.   CESAREAN SECTION     x2  first pregnancy breach and second normal   surgery for endometriosis      Medications:   Current Facility-Administered Medications:    acetaminophen (TYLENOL) tablet 650 mg, 650 mg, Oral, Q6H PRN, Howerter, Justin B, DO, 650 mg at 07/09/21 2014   aspirin EC tablet 81 mg, 81 mg, Oral, Daily, Wynetta Fines T, MD, 81 mg at 07/10/21  0754   enoxaparin (LOVENOX) injection 40 mg, 40 mg, Subcutaneous, Q24H, Wynetta Fines T, MD, 40 mg at 07/09/21 1329   escitalopram (LEXAPRO) tablet 20 mg, 20 mg, Oral, Daily, Wynetta Fines T, MD, 20 mg at 99/37/16 9678   folic acid (FOLVITE) tablet 1 mg, 1 mg, Oral, Daily, Wynetta Fines T, MD, 1 mg at 07/10/21 0755   haloperidol (HALDOL) tablet 2 mg, 2 mg, Oral, BID, Shunte Senseney B, MD, 2 mg at 07/10/21 0755   LORazepam (ATIVAN) tablet 1-4 mg, 1-4 mg, Oral, Q1H PRN **OR** LORazepam (ATIVAN) injection 1-4 mg, 1-4 mg, Intravenous, Q1H PRN, Wynetta Fines T, MD, 1 mg at 07/09/21 0839    metoprolol tartrate (LOPRESSOR) tablet 25 mg, 25 mg, Oral, BID, Wynetta Fines T, MD, 25 mg at 07/09/21 2200   multivitamin with minerals tablet 1 tablet, 1 tablet, Oral, Daily, Wynetta Fines T, MD, 1 tablet at 07/10/21 0754   pantoprazole (PROTONIX) EC tablet 40 mg, 40 mg, Oral, Daily, Wynetta Fines T, MD, 40 mg at 07/10/21 0755   thiamine '500mg'$  in normal saline (63m) IVPB, 500 mg, Intravenous, Q8H, Thurza Kwiecinski B, MD, Last Rate: 100 mL/hr at 07/10/21 0435, 500 mg at 07/10/21 0435  Allergies: Allergies  Allergen Reactions   Penicillins Rash    Has patient had a PCN reaction causing immediate rash, facial/tongue/throat swelling, SOB or lightheadedness with hypotension: Yes Has patient had a PCN reaction causing severe rash involving mucus membranes or skin necrosis: No Has patient had a PCN reaction that required hospitalization No Has patient had a PCN reaction occurring within the last 10 years: No If all of the above answers are "NO", then may proceed with Cephalosporin use.        Objective  Vital signs:  Temp:  [97.6 F (36.4 C)-98.2 F (36.8 C)] 97.6 F (36.4 C) (06/20 0800) Pulse Rate:  [55-95] 76 (06/20 0800) Resp:  [12-20] 16 (06/20 0800) BP: (94-132)/(56-82) 95/63 (06/20 0800) SpO2:  [94 %-100 %] 94 % (06/20 0800)  Psychiatric Specialty Exam:  Presentation  General Appearance: Appropriate for Environment; Casual  Eye Contact:Good  Speech:Clear and Coherent  Speech Volume:Normal  Handedness:No data recorded  Mood and Affect  Mood:Euthymic  Affect:Appropriate   Thought Process  Thought Processes:Goal Directed  Descriptions of Associations:Circumstantial  Orientation:Full (Time, Place and Person)  Thought Content:Logical  History of Schizophrenia/Schizoaffective disorder:No data recorded Duration of Psychotic Symptoms:No data recorded Hallucinations:Hallucinations: None  Ideas of Reference:None  Suicidal Thoughts:Suicidal Thoughts: No  Homicidal  Thoughts:Homicidal Thoughts: No   Sensorium  Memory:Immediate Fair; Recent Fair  Judgment:-- (Improving)  Insight:Fair   Executive Functions  Concentration:Fair  Attention Span:Fair  Recall:No data recorded FTravilah Language:Good   Psychomotor Activity  Psychomotor Activity:Psychomotor Activity: Normal   Assets  Assets:Desire for Improvement; 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  Pulmonary:     Effort: Pulmonary effort is normal.  Neurological:     Mental Status: She is alert and oriented to person, place, and time.    Review of Systems  Psychiatric/Behavioral:  Negative for depression, hallucinations and suicidal ideas. The patient does not have insomnia.    Blood pressure 95/63, pulse 76, temperature 97.6 F (36.4 C), temperature source Oral, resp. rate 16, height '5\' 3"'$  (1.6 m), weight 59.1 kg, last menstrual period 01/22/1992, SpO2 94 %. Body mass index is 23.08 kg/m.

## 2021-07-10 NOTE — Progress Notes (Signed)
Patient admitted with hypoxic respiratory failure and concern for cardiac shunt.  Patient with 20-year history of dysphagia, extending back to early 2000's.  Per [facility] Chart review, she had one endoscopy in 2004 by Dr. Cristina Gong, at which time widely patent Schatzki's ring was seen.  I would advise barium esophagram for investigation of dysphagia.  In absence of significant esophageal stricturing, I would not plan on doing endoscopy prior to anticipated TEE.  We will await results on barium esophagram.

## 2021-07-10 NOTE — Progress Notes (Addendum)
NAME:  Monique Ballard, MRN:  [id number], DOB:  1940/06/20, LOS: 2 ADMISSION DATE:  07/08/2021, CONSULTATION DATE:  07/08/21 REFERRING MD:  Alvino Chapel CHIEF COMPLAINT:  hypoxemia   History of Present Illness:  19 yof presented to ER 6/18 w/ cc: about 1 wk h/o SOB. No cough, CP. No sick exposure. On arrival to ER sats 70% on room air. CT chest negative for PE or parenchymal disease. Place on Supplemental oxygen. ER  worsening hypoxia when in upright position and improved when in supine position.   Incidental episode of hypoxia back in 2021; seen by Dr. Silas Flood at that time no PE, fibrosis or emphysema. ECHO bubble study ordered to eval for shunt but not done. (At that point no Platypnea or orthodeoxia) back in 2021 he was concerned about hepatopulmonary syndrome.  Sats 88% then in the office on room air at that time.  Pertinent  Medical History  Breast cancer, w/ prior left lumpectomy & XRT 1997 (released from [place]) Tremor ETOH Prior stroke 2021 Fatty liver Depression Endometriosis  Gerd HTN Esophageal stricture   Significant Hospital Events: Including procedures, antibiotic start and stop dates in addition to other pertinent events   6/18 admitted for 24hr h/o hypoxia. CT negative for PE and Parenchymal disease.  6/19 Echo w/ bubble study showing intracardiac shunt  Interim History / Subjective:    Patient is feeling better since admission No acute complaints at this time  Objective   Blood pressure 95/63, pulse 76, temperature 97.9 F (36.6 C), temperature source Axillary, resp. rate 16, height '5\' 3"'$  (1.6 m), weight 59.1 kg, last menstrual period 01/22/1992, SpO2 94 %.        Intake/Output Summary (Last 24 hours) at 07/10/2021 0809 Last data filed at 07/10/2021 0600 Gross per 24 hour  Intake 50 ml  Output 400 ml  Net -350 ml   Filed Weights   07/08/21 1843  Weight: 59.1 kg    Examination: General: elderly woman, lying in bed, no acute distress HENT: [place]/AT, moist  mucous membranes, sclera anicteric Lungs: clear to auscultation, no wheezing Cardiovascular: rrr, no murmurs Abdomen: soft, non-tender, non-distended Extremities: right ankle wrapped, no edema of left leg Neuro: alert, oriented, moving all extremities GU: n/a  Resolved Hospital Problem list     Assessment & Plan:  Acute Hypoxemic Respiratory Failure Intracardiac Shunt, Positive bubble study  Discussion/Plan: - Her CT Chest scan is unremarkable in regards to interstitial lung disease or other pulmonary etiologies leading to her hypoxemia - RUQ shows normal appearing liver parenchyma ruling out concern for cirrhosis leading to hepatopulmonary disease - Her Echo does not indicate concern for pulmonary hypertension as the right heart size and function appears normal. - Cardiology consult placed for further evaluation of cardiac shunt process - She will eventually need pulmonary function testing done.   PCCM will sign off. Please call with any further questions or concerns.   Best Practice (right click and "Reselect all SmartList Selections" daily)   Per primary  Labs   CBC: Recent Labs  Lab 07/08/21 0932 07/08/21 0949 07/08/21 1000 07/10/21 0257  WBC 10.7*  --   --  13.0*  NEUTROABS 6.6  --   --   --   HGB 16.9* 16.3* 18.0* 14.0  HCT 48.3* 48.0* 53.0* 42.4  MCV 91.1  --   --  95.1  PLT 278  --   --  882    Basic Metabolic Panel: Recent Labs  Lab 07/08/21 0932 07/08/21 0949 07/08/21 1000 07/08/21  1132 07/10/21 0257  NA 134* 131* 133*  --  136  K 3.3* 3.3* 3.2*  --  3.5  CL 98  --  99  --  108  CO2 20*  --   --   --  21*  GLUCOSE 137*  --  133*  --  85  BUN 16  --  17  --  15  CREATININE 1.03*  --  0.70  --  0.75  CALCIUM 9.8  --   --   --  9.2  MG  --   --   --  1.9 1.9  PHOS  --   --   --   --  2.9   GFR: Estimated Creatinine Clearance: 47.9 mL/min (by C-G formula based on SCr of 0.75 mg/dL). Recent Labs  Lab 07/08/21 0932 07/08/21 1132 07/08/21 1450  07/08/21 1804 07/10/21 0257  WBC 10.7*  --   --   --  13.0*  LATICACIDVEN 2.3* 4.5* 4.2* 1.5  --     Liver Function Tests: Recent Labs  Lab 07/08/21 0932 07/10/21 0257  AST 25 23  ALT 22 21  ALKPHOS 70 49  BILITOT 2.1* 0.7  PROT 7.3 5.8*  ALBUMIN 3.6 2.9*   No results for input(s): "LIPASE", "AMYLASE" in the last 168 hours. No results for input(s): "AMMONIA" in the last 168 hours.  ABG    Component Value Date/Time   PHART 7.483 (H) 07/08/2021 0949   PCO2ART 20.8 (L) 07/08/2021 0949   PO2ART 37 (LL) 07/08/2021 0949   HCO3 15.6 (L) 07/08/2021 0949   TCO2 18 (L) 07/08/2021 1000   ACIDBASEDEF 5.0 (H) 07/08/2021 0949   O2SAT 51.6 07/08/2021 1005     Coagulation Profile: Recent Labs  Lab 07/08/21 0932  INR 1.1    Cardiac Enzymes: No results for input(s): "CKTOTAL", "CKMB", "CKMBINDEX", "TROPONINI" in the last 168 hours.  HbA1C: Hgb A1c MFr Bld  Date/Time Value Ref Range Status  03/03/2020 02:53 PM 4.1 <5.7 % of total Hgb Final    Comment:    For the purpose of screening for the presence of diabetes: . <5.7%       Consistent with the absence of diabetes 5.7-6.4%    Consistent with increased risk for diabetes             (prediabetes) > or =6.5%  Consistent with diabetes . This assay result is consistent with a decreased risk of diabetes. . Currently, no consensus exists regarding use of hemoglobin A1c for diagnosis of diabetes in children. . According to American Diabetes Association (ADA) guidelines, hemoglobin A1c <7.0% represents optimal control in non-pregnant diabetic patients. Different metrics may apply to specific patient populations.  Standards of Medical Care in Diabetes(ADA). Marland Kitchen   02/06/2016 02:26 PM 5.4 <5.7 % Final    Comment:      For the purpose of screening for the presence of diabetes:   <5.7%       Consistent with the absence of diabetes 5.7-6.4 %   Consistent with increased risk for diabetes (prediabetes) >=6.5 %     Consistent  with diabetes   This assay result is consistent with a decreased risk of diabetes.   Currently, no consensus exists regarding use of hemoglobin A1c for diagnosis of diabetes in children.   According to American Diabetes Association (ADA) guidelines, hemoglobin A1c <7.0% represents optimal control in non-pregnant diabetic patients. Different metrics may apply to specific patient populations. Standards of Medical Care in Diabetes (ADA).  CBG: No results for input(s): "GLUCAP" in the last 168 hours.   Critical care time: n/a    Freda Jackson, MD [facility] Office: [phone]   See Amion for personal pager PCCM on call pager [phone] until 7pm. Please call [facility] 7p-7a. [phone]

## 2021-07-10 NOTE — Progress Notes (Signed)
TR[facility] night cross cover note:   I was notified by RN of the patient's complaint of ankle discomfort, with the patient requesting acetaminophen for such.  I subsequently placed order for prn acetaminophen.      Babs Bertin, DO Hospitalist

## 2021-07-10 NOTE — Evaluation (Signed)
Physical Therapy Evaluation Patient Details Name: Monique Ballard MRN: [id number] DOB: 1940/09/25 Today's Date: 07/10/2021  History of Present Illness  Pt is a 81 y/o female admitted secondary to fall and hypoxia. Pt found to have R distal fibular fx and was splinted. Pt also with some hallucinations during hospitalization, but that has since improved. PMH includes breast cancer, alcohol dependency, depression/anxiety.  Clinical Impression  Pt admitted secondary to problem above with deficits below. Pt requiring min A for bed mobility and mod A for transfers using RW. Pt with difficulty maintaining WB status on the R throughout transfer. Oxygen sensor did not get good read throughout transfer while pt on 2L, but pt did not exhibit signs of SOB. BP at 85/63 following transfer, but asymptomatic. Pt's family reports they would prefer for pt to return home. Discussed use of WC for increased safety with mobility tasks and need for assist with transfers. Recommending HHPT to address current deficits. Will continue to follow acutely.      Recommendations for follow up therapy are one component of a multi-disciplinary discharge planning process, led by the attending physician.  Recommendations may be updated based on patient status, additional functional criteria and insurance authorization.  Follow Up Recommendations Home health PT (family would like for pt to return home)    Assistance Recommended at Discharge Frequent or constant Supervision/Assistance  Patient can return home with the following  A lot of help with walking and/or transfers;A lot of help with bathing/dressing/bathroom;Assistance with cooking/housework;Help with stairs or ramp for entrance;Assist for transportation;Direct supervision/assist for financial management;Direct supervision/assist for medications management    Equipment Recommendations Wheelchair (measurements PT);Wheelchair cushion (measurements PT);BSC/3in1 (drop arm BSC)   Recommendations for Other Services       Functional Status Assessment Patient has had a recent decline in their functional status and demonstrates the ability to make significant improvements in function in a reasonable and predictable amount of time.     Precautions / Restrictions Precautions Precautions: Fall Restrictions Weight Bearing Restrictions: Yes RLE Weight Bearing: Non weight bearing      Mobility  Bed Mobility Overal bed mobility: Needs Assistance Bed Mobility: Supine to Sit     Supine to sit: Min assist     General bed mobility comments: assist for trunk and LE assist.    Transfers Overall transfer level: Needs assistance Equipment used: Rolling walker (2 wheels) Transfers: Sit to/from Stand, Bed to chair/wheelchair/BSC Sit to Stand: Mod assist Stand pivot transfers: Mod assist         General transfer comment: Assist for lift assist and steadying. PT kept foot under pt's R foot to help with maintaining weightbearing precautions, however, pt continuing to bear some weight even with cues. Discussed use of WC at home to increase safety    Ambulation/Gait                  Stairs            Wheelchair Mobility    Modified Rankin (Stroke Patients Only)       Balance Overall balance assessment: Needs assistance Sitting-balance support: No upper extremity supported Sitting balance-Leahy Scale: Good     Standing balance support: Bilateral upper extremity supported Standing balance-Leahy Scale: Poor Standing balance comment: Reliant on UE and external support                             Pertinent Vitals/Pain Pain Assessment Pain Assessment: Faces Faces Pain  Scale: Hurts little more Pain Location: R ankle Pain Descriptors / Indicators: Grimacing, Guarding Pain Intervention(s): Limited activity within patient's tolerance, Monitored during session, Repositioned    Home Living Family/patient expects to be discharged to::  Private residence Living Arrangements: Spouse/significant other;Children Available Help at Discharge: Family;Available 24 hours/day Type of Home: House Home Access: Stairs to enter Entrance Stairs-Rails: Left;Right;Can reach both Entrance Stairs-Number of Steps: 2   Home Layout: One level Home Equipment: Grab bars - toilet;BSC/3in1;Grab bars - tub/shower;Shower seat;Cane Soil scientist (4 wheels)      Prior Function Prior Level of Function : Independent/Modified Independent             Mobility Comments: Used cane for ambulation       Hand Dominance        Extremity/Trunk Assessment   Upper Extremity Assessment Upper Extremity Assessment: Defer to OT evaluation    Lower Extremity Assessment Lower Extremity Assessment: RLE deficits/detail;Generalized weakness RLE Deficits / Details: RLE in splint throughout.    Cervical / Trunk Assessment Cervical / Trunk Assessment: Kyphotic  Communication   Communication: No difficulties  Cognition Arousal/Alertness: Awake/alert Behavior During Therapy: WFL for tasks assessed/performed Overall Cognitive Status: Impaired/Different from baseline Area of Impairment: Orientation, Problem solving, Safety/judgement                 Orientation Level: Disoriented to, Time       Safety/Judgement: Decreased awareness of deficits, Decreased awareness of safety   Problem Solving: Slow processing General Comments: Pt with difficulty naming the month, but was eventually able to with cues.        General Comments      Exercises     Assessment/Plan    PT Assessment Patient needs continued PT services  PT Problem List Decreased strength;Decreased range of motion;Decreased activity tolerance;Decreased balance;Decreased mobility;Decreased knowledge of use of DME;Decreased knowledge of precautions;Decreased safety awareness;Decreased cognition;Pain       PT Treatment Interventions DME instruction;Gait training;Functional  mobility training;Balance training;Therapeutic exercise;Therapeutic activities;Stair training;Patient/family education    PT Goals (Current goals can be found in the Care Plan section)  Acute Rehab PT Goals Patient Stated Goal: to go home PT Goal Formulation: With patient/family Time For Goal Achievement: 07/24/21 Potential to Achieve Goals: Good    Frequency Min 5X/week     Co-evaluation               AM-PAC PT "6 Clicks" Mobility  Outcome Measure Help needed turning from your back to your side while in a flat bed without using bedrails?: A Little Help needed moving from lying on your back to sitting on the side of a flat bed without using bedrails?: A Little Help needed moving to and from a bed to a chair (including a wheelchair)?: A Lot Help needed standing up from a chair using your arms (e.g., wheelchair or bedside chair)?: A Lot Help needed to walk in hospital room?: Total Help needed climbing 3-5 steps with a railing? : Total 6 Click Score: 12    End of Session Equipment Utilized During Treatment: Gait belt Activity Tolerance: Patient tolerated treatment well Patient left: in chair;with call bell/phone within reach;with chair alarm set Nurse Communication: Mobility status PT Visit Diagnosis: Unsteadiness on feet (R26.81);Muscle weakness (generalized) (M62.81);Difficulty in walking, not elsewhere classified (R26.2)    Time: 3244-0102 PT Time Calculation (min) (ACUTE ONLY): 21 min   Charges:   PT Evaluation $PT Eval Moderate Complexity: 1 Mod          [place] G,  PT, DPT  Acute Rehabilitation Services  Office: [phone]   Rudean Hitt 07/10/2021, 11:28 AM

## 2021-07-10 NOTE — Consult Note (Signed)
CARDIOLOGY CONSULT NOTE  Patient ID: Monique Ballard MRN: [id number] DOB/AGE: Aug 10, 1940 81 y.o.  Admit date: 07/08/2021 Attending physician: Shawna Clamp, MD Primary Physician:  Colon Branch, MD Outpatient Cardiologist: NA Inpatient Cardiologist: Rex Kras, DO, [facility]  Reason of consultation: Chronic Hypoxia - evaluate for intra-cardiac shunt.  Referring physician: Dr. Freda Jackson  Chief complaint: Shortness of breath  HPI:  Monique Ballard is a 81 y.o. Caucasian female who presents with a chief complaint of " shortness of breath." Her past medical history and cardiovascular risk factors include: Hypertension, chronic hypoxia, left breast cancer status postlumpectomy/radiation/tamoxifen, anxiety, depression, GERD, history of dysphagia requiring esophageal dilatation, aortic atherosclerosis, ectasia of the ascending thoracic aorta 39 mm (CT scan 07/08/2021).  Patient is accompanied by her daughter Monique Ballard at bedside who is a physical therapist at [facility].  Patient presents to the hospital status post mechanical fall on Saturday night 07/07/2021.  When her daughter visited her the following day she was noted to be hypoxic 74% on room air.  Patient has chronic hypoxia since 2021 and has been on oxygen therapy at home.  Patient has shortness of breath at rest and with effort related activities.  Patient states that she feels better lying flat difficulty sitting up findings suggestive of platypnoea.  Review of EMR notes she is supposed to have a TTE with bubble study back in 2021 this did not take place as the patient claims " nobody called me."  Currently denies angina pectoris.  As dysphagia when swallowing large pills, history of esophageal dilatation x2 at least per daughter.  Cardiology has been asked to evaluate the patient for intracardiac shunting as an echocardiogram was performed during this hospitalization which noted a positive agitated bubble study.  ALLERGIES: Allergies   Allergen Reactions   Penicillins Rash    Has patient had a PCN reaction causing immediate rash, facial/tongue/throat swelling, SOB or lightheadedness with hypotension: Yes Has patient had a PCN reaction causing severe rash involving mucus membranes or skin necrosis: No Has patient had a PCN reaction that required hospitalization No Has patient had a PCN reaction occurring within the last 10 years: No If all of the above answers are "NO", then may proceed with Cephalosporin use.     PAST MEDICAL HISTORY: Past Medical History:  Diagnosis Date   Anxiety    Breast cancer ([place]) fall 1997    Left Lumpectomy, XRT.  treated with Tamoxifem and Evista   Depression    Endometriosis in her 20's   surgery to help clear endometriosis to obtain a pregnancy.   GERD (gastroesophageal reflux disease)    w/"stretching" remotely   HTN (hypertension)    Migraines     PAST SURGICAL HISTORY: Past Surgical History:  Procedure Laterality Date   BREAST LUMPECTOMY Left fall 1997   Lymph nodes X 3 were negative, ER+, Radiation treatmetn.  Took Tamoxifem X 5 yrs then Evista for 3-5 yrs.   CESAREAN SECTION     x2  first pregnancy breach and second normal   surgery for endometriosis      FAMILY HISTORY: The patient's family history includes Heart attack in her father; Heart disease in her father; Hypertension in her mother; Stroke in her mother; Throat cancer in an other family member.   SOCIAL HISTORY:  The patient  reports that she quit smoking about 33 years ago. Her smoking use included cigarettes. She has a 12.50 pack-year smoking history. She has never used smokeless tobacco. She reports current alcohol use of  about 7.0 standard drinks of alcohol per week. She reports that she does not use drugs.  MEDICATIONS: Current Outpatient Medications  Medication Instructions   alendronate (FOSAMAX) 70 mg, Oral, Every 7 days, Take with a full glass of water on an empty stomach.   aspirin EC 81 mg, Oral,  Daily, Swallow whole.   benzonatate (TESSALON) 100 mg, Oral, 3 times daily PRN   escitalopram (LEXAPRO) 20 MG tablet TAKE ONE TABLET BY MOUTH ONE TIME DAILY **Schedule appt**   folic acid (FOLVITE) 1 mg, Oral, Daily   OLANZapine (ZYPREXA) 7.5 MG tablet TAKE ONE TABLET BY MOUTH DAILY AT BEDTIME   pantoprazole (PROTONIX) 40 mg, Oral, Daily   thiamine 100 mg, Oral, Daily    REVIEW OF SYSTEMS: Review of Systems  Constitutional: Negative for chills and fever.  HENT:  Negative for hoarse voice and nosebleeds.   Eyes:  Negative for discharge, double vision and pain.  Cardiovascular:  Positive for dyspnea on exertion. Negative for chest pain, claudication, leg swelling, near-syncope, orthopnea, palpitations, paroxysmal nocturnal dyspnea and syncope.  Respiratory:  Positive for shortness of breath. Negative for hemoptysis.        Hypoxia - on home O2, platypnoea    Musculoskeletal:  Negative for muscle cramps and myalgias.  Gastrointestinal:  Positive for dysphagia. Negative for abdominal pain, constipation, diarrhea, hematemesis, hematochezia, melena, nausea and vomiting.  Neurological:  Negative for dizziness and light-headedness.    PHYSICAL EXAM:    07/10/2021    8:00 AM 07/10/2021    4:30 AM 07/10/2021   12:00 AM  Vitals with BMI  Systolic 95 98 629  Diastolic 63 57 74  Pulse 76 55 70    Intake/Output Summary (Last 24 hours) at 07/10/2021 1222 Last data filed at 07/10/2021 0600 Gross per 24 hour  Intake 50 ml  Output 400 ml  Net -350 ml    Net IO Since Admission: 100 mL [07/10/21 1222]  CONSTITUTIONAL: Frail, appears older than stated age, hemodynamically stable, no acute distress, daughter present at bedside.   SKIN: Skin is warm and dry. No rash noted. No cyanosis. No pallor. No jaundice HEAD: Normocephalic and atraumatic.  EYES: No scleral icterus MOUTH/THROAT: Moist oral membranes.  NECK: No JVD present. No thyromegaly noted. No carotid bruits  CHEST Normal respiratory  effort. No intercostal retractions  LUNGS: Poor inspiratory effort, decreased breath sounds bilaterally, no wheezes rales or rhonchi's.  CARDIOVASCULAR: Regular, positive S1-S2, no murmurs rubs or gallops are appreciated.  ABDOMINAL: Soft, nontender, nondistended, positive bowel sounds in all 4 quadrants. EXTREMITIES: Left lower extremity no significant edema.  Right lower extremity wrapped in Ace bandage secondary to fracture.   HEMATOLOGIC: No significant bruising NEUROLOGIC: Oriented to person, place, and time. Nonfocal. Normal muscle tone.  PSYCHIATRIC: Normal mood and affect. Normal behavior. Cooperative  RADIOLOGY: ECHOCARDIOGRAM COMPLETE BUBBLE STUDY  Result Date: 07/09/2021    ECHOCARDIOGRAM REPORT   Patient Name:   Monique Ballard Date of Exam: 07/09/2021 Medical Rec #:  [id number]       Height:       63.0 in Accession #:    [id number]      Weight:       130.3 lb Date of Birth:  12/16/1940      BSA:          1.612 m Patient Age:    31 years        BP:           132/82 mmHg Patient Gender: F  HR:           77 bpm. Exam Location:  Inpatient Procedure: 2D Echo, Cardiac Doppler, Color Doppler and Saline Contrast Bubble            Study Indications:    Respiratory failure  History:        Patient has no prior history of Echocardiogram examinations.                 Signs/Symptoms:Dyspnea. Hypoxia, alcohol dependency, GERD, hx                 breast cancer.  Sonographer:    Clayton Lefort RDCS (AE) Referring Phys: [place] Comments: Suboptimal subcostal window. IMPRESSIONS  1. Left ventricular ejection fraction, by estimation, is 55 to 60%. The left ventricle has normal function. The left ventricle has no regional wall motion abnormalities. Left ventricular diastolic parameters are consistent with Grade I diastolic dysfunction (impaired relaxation).  2. Right ventricular systolic function is normal. The right ventricular size is normal.  3. The mitral valve is normal in  structure. No evidence of mitral valve regurgitation. No evidence of mitral stenosis.  4. The aortic valve is tricuspid. There is mild calcification of the aortic valve. Aortic valve regurgitation is not visualized. No aortic stenosis is present.  5. The inferior vena cava is normal in size with greater than 50% respiratory variability, suggesting right atrial pressure of 3 mmHg.  6. Agitated saline contrast bubble study was positive with shunting observed within 3-6 cardiac cycles suggestive of interatrial shunt. FINDINGS  Left Ventricle: Left ventricular ejection fraction, by estimation, is 55 to 60%. The left ventricle has normal function. The left ventricle has no regional wall motion abnormalities. The left ventricular internal cavity size was normal in size. There is  no left ventricular hypertrophy. Left ventricular diastolic parameters are consistent with Grade I diastolic dysfunction (impaired relaxation). Right Ventricle: The right ventricular size is normal. No increase in right ventricular wall thickness. Right ventricular systolic function is normal. Left Atrium: Left atrial size was normal in size. Right Atrium: Right atrial size was normal in size. Pericardium: There is no evidence of pericardial effusion. Mitral Valve: The mitral valve is normal in structure. No evidence of mitral valve regurgitation. No evidence of mitral valve stenosis. Tricuspid Valve: The tricuspid valve is normal in structure. Tricuspid valve regurgitation is not demonstrated. No evidence of tricuspid stenosis. Aortic Valve: The aortic valve is tricuspid. There is mild calcification of the aortic valve. Aortic valve regurgitation is not visualized. No aortic stenosis is present. Aortic valve mean gradient measures 2.0 mmHg. Aortic valve peak gradient measures 2.5 mmHg. Aortic valve area, by VTI measures 3.33 cm. Pulmonic Valve: The pulmonic valve was normal in structure. Pulmonic valve regurgitation is not visualized. No  evidence of pulmonic stenosis. Aorta: The aortic root is normal in size and structure. Venous: The inferior vena cava is normal in size with greater than 50% respiratory variability, suggesting right atrial pressure of 3 mmHg. IAS/Shunts: No atrial level shunt detected by color flow Doppler. Agitated saline contrast was given intravenously to evaluate for intracardiac shunting. Agitated saline contrast bubble study was positive with shunting observed within 3-6 cardiac cycles suggestive of interatrial shunt.  LEFT VENTRICLE PLAX 2D LVIDd:         4.40 cm   Diastology LVIDs:         3.60 cm   LV e' medial:    4.43 cm/s LV PW:  0.80 cm   LV E/e' medial:  7.2 LV IVS:        1.20 cm   LV e' lateral:   7.46 cm/s LVOT diam:     2.30 cm   LV E/e' lateral: 4.2 LV SV:         55 LV SV Index:   34 LVOT Area:     4.15 cm  RIGHT VENTRICLE RV Basal diam:  2.50 cm RV S prime:     9.57 cm/s TAPSE (M-mode): 1.8 cm LEFT ATRIUM           Index        RIGHT ATRIUM          Index LA diam:      2.50 cm 1.55 cm/m   RA Area:     8.76 cm LA Vol (A2C): 34.0 ml 21.10 ml/m  RA Volume:   15.00 ml 9.31 ml/m LA Vol (A4C): 15.9 ml 9.87 ml/m  AORTIC VALVE AV Area (Vmax):    3.45 cm AV Area (Vmean):   3.13 cm AV Area (VTI):     3.33 cm AV Vmax:           78.30 cm/s AV Vmean:          59.200 cm/s AV VTI:            0.166 m AV Peak Grad:      2.5 mmHg AV Mean Grad:      2.0 mmHg LVOT Vmax:         65.00 cm/s LVOT Vmean:        44.600 cm/s LVOT VTI:          0.133 m LVOT/AV VTI ratio: 0.80  AORTA Ao Root diam: 3.20 cm Ao Asc diam:  3.60 cm MITRAL VALVE MV Area (PHT): 5.31 cm    SHUNTS MV Decel Time: 143 msec    Systemic VTI:  0.13 m MV E velocity: 31.70 cm/s  Systemic Diam: 2.30 cm MV A velocity: 98.50 cm/s MV E/A ratio:  0.32 Monique Bickers MD Electronically signed by Monique Bickers MD Signature Date/Time: 07/09/2021/3:36:01 PM    Final    US Abdomen Limited RUQ (LIVER/GB)  Result Date: 07/08/2021 CLINICAL DATA:  Cirrhosis.  EXAM: ULTRASOUND ABDOMEN LIMITED RIGHT UPPER QUADRANT COMPARISON:  03/07/2020 FINDINGS: Gallbladder: Single 6 mm stone with mild associated sludge. No gallbladder wall thickening or adjacent free fluid. Negative sonographic Murphy sign. Common bile duct: Diameter: 2.5 mm. Liver: No focal lesion identified. Within normal limits in parenchymal echogenicity. Portal vein is patent on color Doppler imaging with normal direction of blood flow towards the liver. Other: None. IMPRESSION: Single 6 mm gallstone with associated gallbladder sludge. No additional sonographic evidence to suggest acute cholecystitis. Electronically Signed   By: Monique Ballard M.D.   On: 07/08/2021 13:32   DG Ankle Complete Right  Result Date: 07/08/2021 CLINICAL DATA:  Fall with lateral right ankle pain. EXAM: RIGHT ANKLE - COMPLETE 3+ VIEW COMPARISON:  None Available. FINDINGS: Exam demonstrates a minimally displaced oblique fracture of the distal fibular metaphyseal region at the level of the ankle mortise. Ankle mortise is otherwise within normal. Minimal degenerative change of the hindfoot. Small inferior calcaneal spur. IMPRESSION: Minimally displaced oblique fracture of the distal fibular metaphysis. Electronically Signed   By: Monique Ballard M.D.   On: 07/08/2021 12:53    LABORATORY DATA: Lab Results  Component Value Date   WBC 13.0 (H) 07/10/2021   HGB 14.0 07/10/2021  HCT 42.4 07/10/2021   MCV 95.1 07/10/2021   PLT 235 07/10/2021    Recent Labs  Lab 07/10/21 0257  NA 136  K 3.5  CL 108  CO2 21*  BUN 15  CREATININE 0.75  CALCIUM 9.2  PROT 5.8*  BILITOT 0.7  ALKPHOS 49  ALT 21  AST 23  GLUCOSE 85    Lipid Panel  Lab Results  Component Value Date   CHOL 161 01/01/2018   HDL 47.70 01/01/2018   LDLCALC 74 01/01/2018   TRIG 197.0 (H) 01/01/2018   CHOLHDL 3 01/01/2018    BNP (last 3 results) Recent Labs    07/08/21 0932  BNP 96.3    HEMOGLOBIN A1C Lab Results  Component Value Date   HGBA1C 4.1  03/03/2020   MPG 71 03/03/2020    Cardiac Panel (last 3 results) Recent Labs    07/08/21 0932 07/08/21 1132  TROPONINIHS 30* 20*     TSH Recent Labs    07/08/21 1803  TSH 1.324     CARDIAC DATABASE: EKG: 07/08/2021: Sinus tachycardia, 110 bpm, left axis deviation, subtle ST-T changes in the lateral leads cannot rule out ischemia.  Echocardiogram: 07/09/2021: LVEF 55-60%, no regional wall motion abnormalities, grade 1 diastolic impairment, right ventricular size and function normal, no mitral stenosis, aortic valve sclerosis without stenosis, estimated RAP 3 mmHg, positive sonicated saline study suggestive of intra-atrial shunt.  IMPRESSION & RECOMMENDATIONS: Monique Ballard is a 81 y.o. Caucasian female whose past medical history and cardiovascular risk factors include: Hypertension, chronic hypoxia, left breast cancer status postlumpectomy/radiation/tamoxifen, anxiety, depression, GERD, history of dysphagia requiring esophageal dilatation, aortic atherosclerosis, ectasia of the ascending thoracic aorta 39 mm (CT scan 07/08/2021).  Impression:  Hypoxia -multifactorial. Platypnoea    Abnormal echocardiogram-sonicated bubble study concerning for intra-atrial shunting. Hypertension. Grade 1 diastolic dysfunction. Dysphagia with history of esophageal dilatation. Aortic atherosclerosis. Ectasia of the ascending thoracic aorta 39 mm. History of left-sided breast cancer status postlumpectomy/radiation/tamoxifen  Plan:  Patient was evaluated by pulmonary medicine due to chronic hypoxia requiring oxygen therapy at home.  Had an echocardiogram as part of the hypoxia work-up which notes preserved LVEF, grade 1 diastolic impairment, and intra atrial shunting suggestive of possible PFO.  Other findings include thoracic kyphosis, ectatic ascending thoracic aorta, and possible PFO which all may be contributing to her symptoms of platypnoea.  Currently on 2 L nasal cannula oxygen  CT PE  study negative for pulmonary embolism.  BNP within normal limits.  Not volume overloaded on physical examination.  Personally reviewed the echocardiogram bubble study is positive for intra-atrial shunting image acquisition was apical 5 chamber and therefore suboptimal images to evaluate the interatrial septum.  No flow per color Doppler on transthoracic echo.  We discussed undergoing transesophageal echocardiogram with both the patient and her daughter with regards to risks, benefits, limitations and alternatives.  Given her active dysphagia and history of esophageal dilatation shared decision was to proceed with GI consultation for further evaluation and management prior to proceeding with TEE.  In the meantime we will obtain a cardiac MRI to evaluate for PFO/ASD and other associated cardiac anomalies.  Check orthostatic vital signs.  We will await for GI clearance prior to proceeding with transesophageal echocardiogram which is tentatively scheduled for tomorrow afternoon.  We will defer management of other chronic comorbid conditions and active problem list to primary team.  Plan of care discussed with patient, daughter Monique Ballard, attending physician Dr. Dwyane Dee, and pulmonologist Dr. Erin Fulling.  Patient's questions and  concerns were addressed to her satisfaction. She voices understanding of the instructions provided during this encounter.   This note was created using a voice recognition software as a result there may be grammatical errors inadvertently enclosed that do not reflect the nature of this encounter. Every attempt is made to correct such errors.  Mechele Claude [facility]  Pager: [phone] Office: [phone] 07/10/2021, 12:22 PM

## 2021-07-10 NOTE — Progress Notes (Signed)
PROGRESS NOTE    Monique Ballard  EHU:[id number] DOB: 12/13/1940 DOA: 07/08/2021 PCP: Colon Branch, MD    Brief Narrative:  This 81 years old female with PMH significant for chronic hypoxic respiratory failure, noncompliant with home oxygen, alcohol dependence, breast cancer status postlumpectomy and chemotherapy, GERD, anxiety/depression presented with exertional shortness of breath for 2 to 3 days.  At baseline she has chronic exertional dyspnea but has worsened in the last few days.  Patient was using oxygen on and off for last 2 years.  She has not followed up with any pulmonologist.  Patient also sustained a mechanical fall and hit her right ankle and started to feel pain. Patient was admitted for acute on chronic hypoxic respiratory failure requiring BiPAP and nondisplaced right distal fibular fracture. She is successfully weaned down to high flow nasal cannula at 3 L.  Chest x-ray without any infiltrates.  CTA negative for PE.  It was discussed with [facility] Dr. Lyla Glassing who recommended to splinter and nonweightbearing follow-up Dr. Kathaleen Bury in 7 days.  Echocardiogram shows intra-atrial shunt.  Cardiology is consulted for TEE.  Patient does have a history of esophageal strictures in the past,  required esophageal dilatation.  GI is consulted for evaluation before TEE is scheduled tomorrow.  Assessment & Plan:   Principal Problem:   Hypoxia Active Problems:   Alcohol dependency ([place])   Acute respiratory failure (HCC)  Acute on chronic hypoxic respiratory failure: She was severely hypoxic requiring BiPAP on arrival.  Now weaned down to HFNC 15L Etiology unknown.  Patient was evaluated by PCCM, initial impression was hepatopulmonary syndrome versus other shunt conditions.   Recommended echocardiogram and RUQ ultrasound. RUQ ultrasound: Single 6 mm gallstone without acute cholecystitis.  Echo shows LVEF 55 to 60%, no RWMA.  Intra-atrial shunt noted. Patient also found to have  polycythemia which could be causing chronic hypoxia. CTA chest ruled out pulmonary embolism.  Other differential diagnosis pulmonary hypertension. She is successfully weaned down to 3 L high flow nasal cannula sats 94%.  Wean as tolerated. Given echo finding noted on echo,  cardiology is consulted for TEE, tentatively scheduled for tomorrow. Cardiology recommended cardiac MRI, GI evaluation.  Intra atrial shunt : Cardiology is consulted, tentatively scheduled for TEE tomorrow Cardiac MRI completed,  report pending.  GI was consulted given history of esophageal strictures in the past, required dilatation GI recommended barium esophagogram.  Right ankle fracture: X-ray showed nondisplaced right distal fibular fracture. Discussed with [facility] Dr. Lyla Glassing, recommended splint for now and nonweightbearing. Follow-up Dr. Kathaleen Bury in 7 days.  Hypokalemia: Replaced.  Continue to monitor  Polycythemia: Likely causing chronic hypoxia.  Elevated lactic acid: > Resolved. Patient appears euvolemic on exam.  Assessed by PCCM, concerned about underlying liver disease.   Avoid aggressive hydration at this point. Chest x-ray no infiltrate, no urinary symptoms, monitor off antibiotics. Lactic acid normalized with IV hydration.  Sinus tachycardia: Patient appears euvolemic,  no further hydration. Continue low-dose metoprolol.  Elevated troponin: Could be in the setting of hypoxia, denies any chest pain. Troponin trending down 30> 20.  EKG without ischemic changes 2D echocardiogram shows LVEF 55 to 60%.  No RWMA.  EtOH abuse: No signs and symptoms of active withdrawal.   Continue CIWA protocol. Patient was agitated restless trying to get out of bed, was given Ativan.   DVT prophylaxis: Lovenox. Code Status: Full code. Family Communication: No family at bed side. Disposition Plan:    Status is: Inpatient Remains inpatient appropriate because:  Admitted for acute on chronic  hypoxic respiratory failure requiring BiPAP now weaned down to HFNC 3 L.  Patient continued on CIWA protocol for alcohol withdrawal.  Echo shows intra-atrial shunt, requiring TEE tentatively scheduled for tomorrow.    Consultants:  None  Procedures:  Echo, RUQ ultrasound Antimicrobials:   Anti-infectives (From admission, onward)    None        Subjective: Patient was seen and examined at bedside.  Overnight events noted. Patient appears much improved, lying comfortably,  very interactive fully conversant. She denies any chest pain, shortness of breath or dizziness.  Objective: Vitals:   07/09/21 2200 07/10/21 0000 07/10/21 0430 07/10/21 0800  BP: 98/68 102/74 (!) 98/57 95/63  Pulse: 90 70 (!) 55 76  Resp:  '16 14 16  '$ Temp:  98.2 F (36.8 C) 97.9 F (36.6 C) 97.6 F (36.4 C)  TempSrc:  Axillary Axillary Oral  SpO2:  100% 100% 94%  Weight:      Height:        Intake/Output Summary (Last 24 hours) at 07/10/2021 1505 Last data filed at 07/10/2021 0600 Gross per 24 hour  Intake --  Output 400 ml  Net -400 ml   Filed Weights   07/08/21 1843  Weight: 59.1 kg    Examination:  General exam: Appears comfortable, not in any acute distress, deconditioned Respiratory system: CTA bilaterally, no wheezing, no crackles, normal respiratory effort. Cardiovascular system: S1-S2 heard, regular rate and rhythm, no murmur. Gastrointestinal system: Abdomen is soft, nontender, nondistended, BS+ Central nervous system: Alert, oriented x3, no focal neurological deficits. Extremities: No edema, no cyanosis, no clubbing. Skin: No rashes, lesions or ulcers Psychiatry: Mood and judgment normal.    Data Reviewed: I have personally reviewed following labs and imaging studies  CBC: Recent Labs  Lab 07/08/21 0932 07/08/21 0949 07/08/21 1000 07/10/21 0257  WBC 10.7*  --   --  13.0*  NEUTROABS 6.6  --   --   --   HGB 16.9* 16.3* 18.0* 14.0  HCT 48.3* 48.0* 53.0* 42.4  MCV 91.1  --    --  95.1  PLT 278  --   --  630   Basic Metabolic Panel: Recent Labs  Lab 07/08/21 0932 07/08/21 0949 07/08/21 1000 07/08/21 1132 07/10/21 0257  NA 134* 131* 133*  --  136  K 3.3* 3.3* 3.2*  --  3.5  CL 98  --  99  --  108  CO2 20*  --   --   --  21*  GLUCOSE 137*  --  133*  --  85  BUN 16  --  17  --  15  CREATININE 1.03*  --  0.70  --  0.75  CALCIUM 9.8  --   --   --  9.2  MG  --   --   --  1.9 1.9  PHOS  --   --   --   --  2.9   GFR: Estimated Creatinine Clearance: 47.9 mL/min (by C-G formula based on SCr of 0.75 mg/dL). Liver Function Tests: Recent Labs  Lab 07/08/21 0932 07/10/21 0257  AST 25 23  ALT 22 21  ALKPHOS 70 49  BILITOT 2.1* 0.7  PROT 7.3 5.8*  ALBUMIN 3.6 2.9*   No results for input(s): "LIPASE", "AMYLASE" in the last 168 hours. No results for input(s): "AMMONIA" in the last 168 hours. Coagulation Profile: Recent Labs  Lab 07/08/21 0932  INR 1.1   Cardiac Enzymes: No results for  input(s): "CKTOTAL", "CKMB", "CKMBINDEX", "TROPONINI" in the last 168 hours. BNP (last 3 results) No results for input(s): "PROBNP" in the last 8760 hours. HbA1C: No results for input(s): "HGBA1C" in the last 72 hours. CBG: No results for input(s): "GLUCAP" in the last 168 hours. Lipid Profile: No results for input(s): "CHOL", "HDL", "LDLCALC", "TRIG", "CHOLHDL", "LDLDIRECT" in the last 72 hours. Thyroid Function Tests: Recent Labs    07/08/21 1803  TSH 1.324   Anemia Panel: No results for input(s): "VITAMINB12", "FOLATE", "FERRITIN", "TIBC", "IRON", "RETICCTPCT" in the last 72 hours. Sepsis Labs: Recent Labs  Lab 07/08/21 0932 07/08/21 1132 07/08/21 1450 07/08/21 1804  LATICACIDVEN 2.3* 4.5* 4.2* 1.5    Recent Results (from the past 240 hour(s))  Culture, blood (Routine x 2)     Status: None (Preliminary result)   Collection Time: 07/08/21  9:32 AM   Specimen: BLOOD  Result Value Ref Range Status   Specimen Description BLOOD LEFT ANTECUBITAL  Final    Special Requests   Final    BOTTLES DRAWN AEROBIC AND ANAEROBIC Blood Culture results may not be optimal due to an inadequate volume of blood received in culture bottles   Culture   Final    NO GROWTH 2 DAYS Performed at [facility] [address], [place], [place] [address]    Report Status PENDING  Incomplete  SARS Coronavirus 2 by RT PCR (hospital order, performed in [facility] hospital lab) *cepheid single result test* Anterior Nasal Swab     Status: None   Collection Time: 07/08/21  9:33 AM   Specimen: Anterior Nasal Swab  Result Value Ref Range Status   SARS Coronavirus 2 by RT PCR NEGATIVE NEGATIVE Final    Comment: (NOTE) SARS-CoV-2 target nucleic acids are NOT DETECTED.  The SARS-CoV-2 RNA is generally detectable in upper and lower respiratory specimens during the acute phase of infection. The lowest concentration of SARS-CoV-2 viral copies this assay can detect is 250 copies / mL. A negative result does not preclude SARS-CoV-2 infection and should not be used as the sole basis for treatment or other patient management decisions.  A negative result may occur with improper specimen collection / handling, submission of specimen other than nasopharyngeal swab, presence of viral mutation(s) within the areas targeted by this assay, and inadequate number of viral copies (<250 copies / mL). A negative result must be combined with clinical observations, patient history, and epidemiological information.  Fact Sheet for Patients:   https://www.patel.info/  Fact Sheet for Healthcare Providers: https://hall.com/  This test is not yet approved or  cleared by the [place] FDA and has been authorized for detection and/or diagnosis of SARS-CoV-2 by FDA under an Emergency Use Authorization (EUA).  This EUA will remain in effect (meaning this test can be used) for the duration of the COVID-19 declaration under Section 564(b)(1) of  the Act, 21 U.S.C. section 360bbb-3(b)(1), unless the authorization is terminated or revoked sooner.  Performed at [facility], [place] [address] [place], [place] [address]   Culture, blood (Routine x 2)     Status: None (Preliminary result)   Collection Time: 07/08/21 10:13 AM   Specimen: BLOOD RIGHT WRIST  Result Value Ref Range Status   Specimen Description BLOOD RIGHT WRIST  Final   Special Requests   Final    BOTTLES DRAWN AEROBIC AND ANAEROBIC Blood Culture results may not be optimal due to an inadequate volume of blood received in culture bottles   Culture  Final    NO GROWTH 2 DAYS Performed at [facility], [place] [address] [place], [place] [address]    Report Status PENDING  Incomplete    Radiology Studies: ECHOCARDIOGRAM COMPLETE BUBBLE STUDY  Result Date: 07/09/2021    ECHOCARDIOGRAM REPORT   Patient Name:   STEPAHNIE CAMPO Date of Exam: 07/09/2021 Medical Rec #:  [id number]       Height:       63.0 in Accession #:    [id number]      Weight:       130.3 lb Date of Birth:  10/20/1940      BSA:          1.612 m Patient Age:    29 years        BP:           132/82 mmHg Patient Gender: F               HR:           77 bpm. Exam Location:  Inpatient Procedure: 2D Echo, Cardiac Doppler, Color Doppler and Saline Contrast Bubble            Study Indications:    Respiratory failure  History:        Patient has no prior history of Echocardiogram examinations.                 Signs/Symptoms:Dyspnea. Hypoxia, alcohol dependency, GERD, hx                 breast cancer.  Sonographer:    Clayton Lefort RDCS (AE) Referring Phys: [place] Comments: Suboptimal subcostal window. IMPRESSIONS  1. Left ventricular ejection fraction, by estimation, is 55 to 60%. The left ventricle has normal function. The left ventricle has no regional wall motion abnormalities. Left ventricular diastolic parameters are consistent with Grade I diastolic dysfunction (impaired relaxation).   2. Right ventricular systolic function is normal. The right ventricular size is normal.  3. The mitral valve is normal in structure. No evidence of mitral valve regurgitation. No evidence of mitral stenosis.  4. The aortic valve is tricuspid. There is mild calcification of the aortic valve. Aortic valve regurgitation is not visualized. No aortic stenosis is present.  5. The inferior vena cava is normal in size with greater than 50% respiratory variability, suggesting right atrial pressure of 3 mmHg.  6. Agitated saline contrast bubble study was positive with shunting observed within 3-6 cardiac cycles suggestive of interatrial shunt. FINDINGS  Left Ventricle: Left ventricular ejection fraction, by estimation, is 55 to 60%. The left ventricle has normal function. The left ventricle has no regional wall motion abnormalities. The left ventricular internal cavity size was normal in size. There is  no left ventricular hypertrophy. Left ventricular diastolic parameters are consistent with Grade I diastolic dysfunction (impaired relaxation). Right Ventricle: The right ventricular size is normal. No increase in right ventricular wall thickness. Right ventricular systolic function is normal. Left Atrium: Left atrial size was normal in size. Right Atrium: Right atrial size was normal in size. Pericardium: There is no evidence of pericardial effusion. Mitral Valve: The mitral valve is normal in structure. No evidence of mitral valve regurgitation. No evidence of mitral valve stenosis. Tricuspid Valve: The tricuspid valve is normal in structure. Tricuspid valve regurgitation is not demonstrated. No evidence of tricuspid stenosis. Aortic Valve: The aortic valve is tricuspid. There is mild calcification of the aortic valve. Aortic valve regurgitation  is not visualized. No aortic stenosis is present. Aortic valve mean gradient measures 2.0 mmHg. Aortic valve peak gradient measures 2.5 mmHg. Aortic valve area, by VTI measures 3.33  cm. Pulmonic Valve: The pulmonic valve was normal in structure. Pulmonic valve regurgitation is not visualized. No evidence of pulmonic stenosis. Aorta: The aortic root is normal in size and structure. Venous: The inferior vena cava is normal in size with greater than 50% respiratory variability, suggesting right atrial pressure of 3 mmHg. IAS/Shunts: No atrial level shunt detected by color flow Doppler. Agitated saline contrast was given intravenously to evaluate for intracardiac shunting. Agitated saline contrast bubble study was positive with shunting observed within 3-6 cardiac cycles suggestive of interatrial shunt.  LEFT VENTRICLE PLAX 2D LVIDd:         4.40 cm   Diastology LVIDs:         3.60 cm   LV e' medial:    4.43 cm/s LV PW:         0.80 cm   LV E/e' medial:  7.2 LV IVS:        1.20 cm   LV e' lateral:   7.46 cm/s LVOT diam:     2.30 cm   LV E/e' lateral: 4.2 LV SV:         55 LV SV Index:   34 LVOT Area:     4.15 cm  RIGHT VENTRICLE RV Basal diam:  2.50 cm RV S prime:     9.57 cm/s TAPSE (M-mode): 1.8 cm LEFT ATRIUM           Index        RIGHT ATRIUM          Index LA diam:      2.50 cm 1.55 cm/m   RA Area:     8.76 cm LA Vol (A2C): 34.0 ml 21.10 ml/m  RA Volume:   15.00 ml 9.31 ml/m LA Vol (A4C): 15.9 ml 9.87 ml/m  AORTIC VALVE AV Area (Vmax):    3.45 cm AV Area (Vmean):   3.13 cm AV Area (VTI):     3.33 cm AV Vmax:           78.30 cm/s AV Vmean:          59.200 cm/s AV VTI:            0.166 m AV Peak Grad:      2.5 mmHg AV Mean Grad:      2.0 mmHg LVOT Vmax:         65.00 cm/s LVOT Vmean:        44.600 cm/s LVOT VTI:          0.133 m LVOT/AV VTI ratio: 0.80  AORTA Ao Root diam: 3.20 cm Ao Asc diam:  3.60 cm MITRAL VALVE MV Area (PHT): 5.31 cm    SHUNTS MV Decel Time: 143 msec    Systemic VTI:  0.13 m MV E velocity: 31.70 cm/s  Systemic Diam: 2.30 cm MV A velocity: 98.50 cm/s MV E/A ratio:  0.32 Glori Bickers MD Electronically signed by Glori Bickers MD Signature Date/Time:  07/09/2021/3:36:01 PM    Final      Scheduled Meds:  aspirin EC  81 mg Oral Daily   enoxaparin (LOVENOX) injection  40 mg Subcutaneous Q24H   escitalopram  20 mg Oral Daily   folic acid  1 mg Oral Daily   metoprolol tartrate  25 mg Oral BID   multivitamin with minerals  1  tablet Oral Daily   pantoprazole  40 mg Oral Daily   Continuous Infusions:  thiamine injection 500 mg (07/10/21 1435)     LOS: 2 days    Time spent: 35 mins    Jaheim Canino, MD [facility]   If 7PM-7AM, please contact night-coverage

## 2021-07-10 NOTE — TOC Initial Note (Signed)
Transition of Care [facility]) - Initial/Assessment Note    Patient Details  Name: Monique Ballard MRN: [id number] Date of Birth: March 04, 1940  [facility] CM/SW Contact:    Carles Collet, RN Phone Number: 07/10/2021, 2:32 PM  Clinical Narrative:           Spoke w patient and daughter at bedside to discuss needs for DC [place], patient and daughter agreeable to [facility] services, shared medicare ratings and [facility] chosen, referral accepted. Needs orders for [facility] PT OT and face to face DME Identified need for WC. Order placed. Likely need for home oxygen, ambulatory sats pending. They would like to use provider they had before. Verified w [facility] she was active in 2021 for oxygen. Patient still has POC but will need home concentrator Needs ambulatory sats, order for oxygen if applicable, and request to [facility] for WC and O2.          Expected Discharge Plan: [place] Barriers to Discharge: Continued Medical Work up   Patient Goals and CMS Choice Patient states their goals for this hospitalization and ongoing recovery are:: to go home CMS Medicare.gov Compare Post Acute Care list provided to:: Patient Choice offered to / list presented to : Patient  Expected Discharge Plan and Services Expected Discharge Plan: [place]   Discharge Planning Services: CM Consult Post Acute Care Choice: Home Health, Durable Medical Equipment Living arrangements for the past 2 months: Single Family Home                 DME Arranged: Wheelchair manual         HH Arranged: PT, OT HH Agency: [facility] Date [place]: 07/10/21 Time [place]: [id number] Representative spoke with at [place]: [place]  Prior Living Arrangements/Services Living arrangements for the past 2 months: [place] with:: Adult Children, Spouse Patient language and need for interpreter reviewed:: Yes Do you feel safe going back to the  place where you live?: Yes        Care giver support system in place?: Yes (comment) Current home services: DME (quad cane/ walker/ shower seat/ portable O2 tank/ grab bars in shower/ elevated toilet seat with handles) Criminal Activity/Legal Involvement Pertinent to Current Situation/Hospitalization: No - Comment as needed  Activities of Daily Living Home Assistive Devices/Equipment: Bedside commode/3-in-1, Cane (specify quad or straight), Grab bars in shower ADL Screening (condition at time of admission) Patient's cognitive ability adequate to safely complete daily activities?: Yes Is the patient deaf or have difficulty hearing?: No Does the patient have difficulty seeing, even when wearing glasses/contacts?: No Does the patient have difficulty concentrating, remembering, or making decisions?: No Patient able to express need for assistance with ADLs?: Yes Does the patient have difficulty dressing or bathing?: Yes Independently performs ADLs?: No Communication: Independent Dressing (OT): Needs assistance Is this a change from baseline?: Change from baseline, expected to last >3 days Grooming: Independent Feeding: Independent Bathing: Needs assistance Is this a change from baseline?: Change from baseline, expected to last >3 days Toileting: Needs assistance Is this a change from baseline?: Change from baseline, expected to last >3days In/Out Bed: Dependent Is this a change from baseline?: Change from baseline, expected to last >3 days Walks in Home: Dependent Is this a change from baseline?: Change from baseline, expected to last >3 days Does the patient have difficulty walking or climbing stairs?: Yes Weakness of Legs: Right Weakness of  Arms/Hands: None  Permission Sought/Granted                  Emotional Assessment Appearance:: Appears stated age         Psych Involvement: Yes (comment)  Admission diagnosis:  Hypoxia [R09.02] Patient Active Problem List    Diagnosis Date Noted   Hypoxia 07/08/2021   Nocturnal hypoxia 07/26/2020   Hypokalemia 10/01/2019   Acute respiratory failure ([place]) 09/30/2019   Alcohol dependency ([place]) 05/15/2018   Tremor 02/24/2015   Gait disorder 02/24/2015   PCP NOTES >>>>>>> 12/13/2014   Abdominal wall strain 03/19/2012   Osteoporosis 10/04/2011   Annual physical exam 09/07/2010   GERD 08/28/2009   Anxiety and depression 10/06/2006   Essential hypertension 10/06/2006   BREAST CANCER, HX OF 10/06/2006   PCP:  Colon Branch, MD Pharmacy:   [facility] [address] [place] [address] [place] [address] Phone: [phone] Fax: [phone]     Social Determinants of Health (SDOH) Interventions    Readmission Risk Interventions     No data to display

## 2021-07-10 NOTE — Care Management (Cosign Needed Addendum)
    Durable Medical Equipment  (From admission, onward)           Start     Ordered   07/10/21 1430  For home use only DME lightweight manual wheelchair with seat cushion  Once       Comments: Patient suffers from weakness which impairs their ability to perform daily activities like toileting in the home.  A cane will not resolve  issue with performing activities of daily living. A wheelchair will allow patient to safely perform daily activities. Patient is not able to propel themselves in the home using a standard weight wheelchair due to general weakness. Patient can self propel in the lightweight wheelchair. Length of need Lifetime. Accessories: elevating leg rests (ELRs), wheel locks, extensions and anti-tippers.   07/10/21 1430

## 2021-07-10 NOTE — Progress Notes (Signed)
SATURATION QUALIFICATIONS: (This note is used to comply with regulatory documentation for home oxygen)  Patient Saturations on Room Air at Rest = 88%  Patient Saturations on Room Air while Ambulating = 86%  Patient Saturations on 2 Liters of oxygen while Ambulating = 94%

## 2021-07-11 ENCOUNTER — Inpatient Hospital Stay (HOSPITAL_COMMUNITY): Payer: Medicare Other

## 2021-07-11 ENCOUNTER — Other Ambulatory Visit (HOSPITAL_COMMUNITY): Payer: Medicare Other

## 2021-07-11 DIAGNOSIS — R0902 Hypoxemia: Secondary | ICD-10-CM | POA: Diagnosis not present

## 2021-07-11 LAB — ERYTHROPOIETIN: Erythropoietin: 3.8 m[IU]/mL (ref 2.6–18.5)

## 2021-07-11 IMAGING — RF DG ESOPHAGUS
6 of 8 series · 19 of 24 positions shown · non-contrast
Comparison: CT Angio Chest [DATE]

CLINICAL DATA: Dysphagia. Prior history of Schatzki's ring in [7K].

EXAM:
ESOPHAGUS/BARIUM SWALLOW/TABLET STUDY
TECHNIQUE: Single contrast examination was performed using thin liquid barium.
This exam was performed by TAYTAY, and was supervised
and interpreted by TAYTAY.
FLUOROSCOPY:
Radiation Exposure Index (as provided by the fluoroscopic device):
7.3 mGy

[Series 1: cp_standard · 0.54mm/px · 3 of 115 frames shown (1 of 6)]
[frame 18/115]
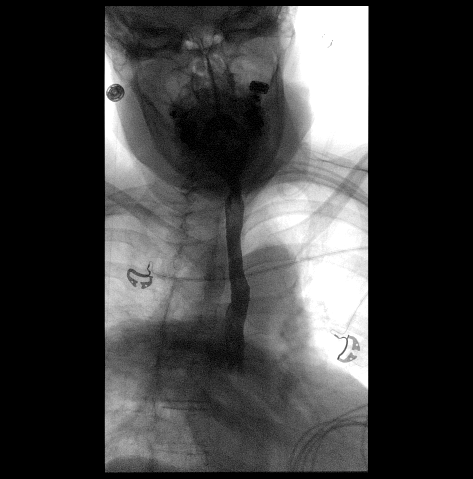
[frame 58/115]
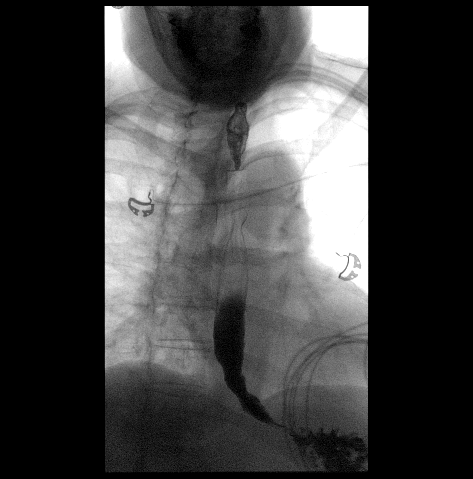
[frame 98/115]
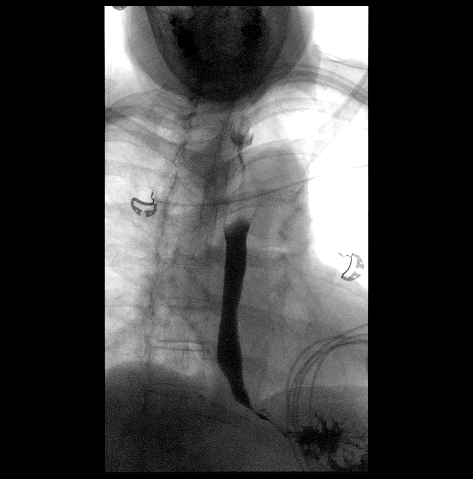

[Series 2: cp_standard · 0.55mm/px · 3 of 128 frames shown (2 of 6)]
[frame 20/128]
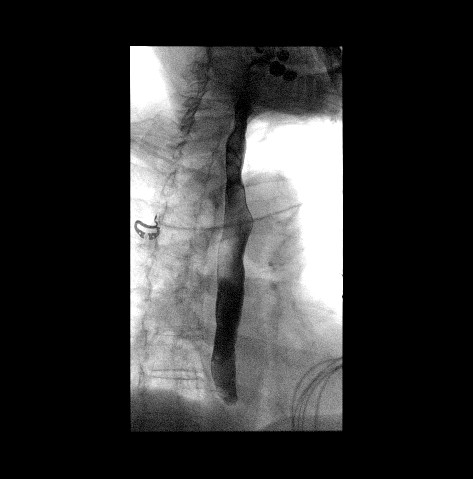
[frame 56/128]
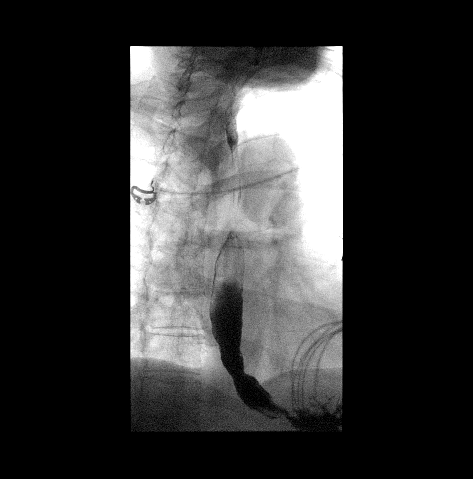
[frame 109/128]
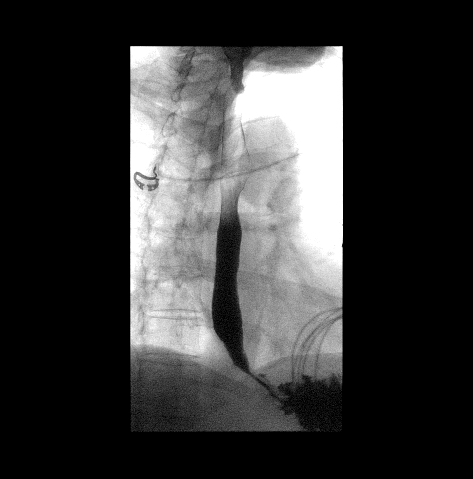

[Series 3: cp_standard · 0.37mm/px · 3 of 64 frames shown (3 of 6)]
[frame 33/64]
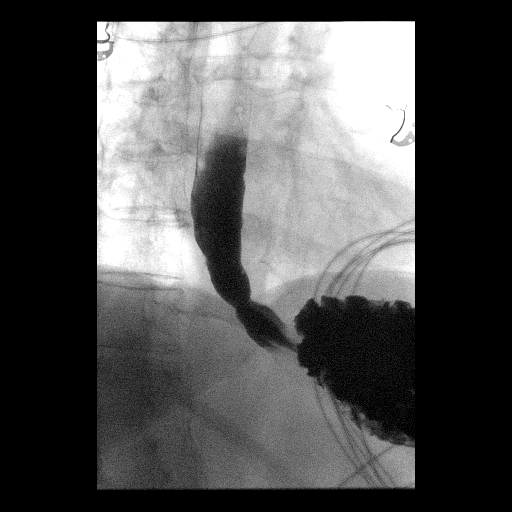
[frame 55/64]
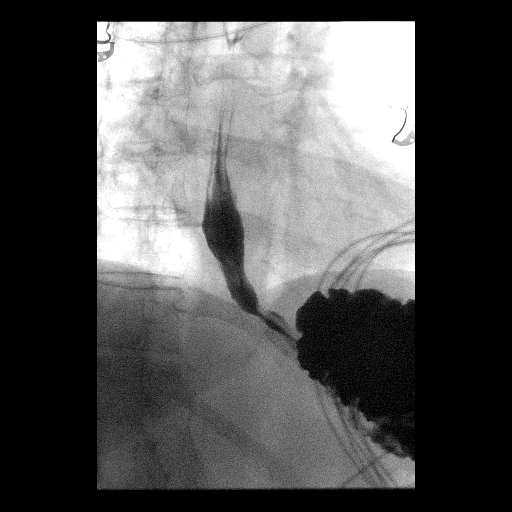
[frame 64/64]
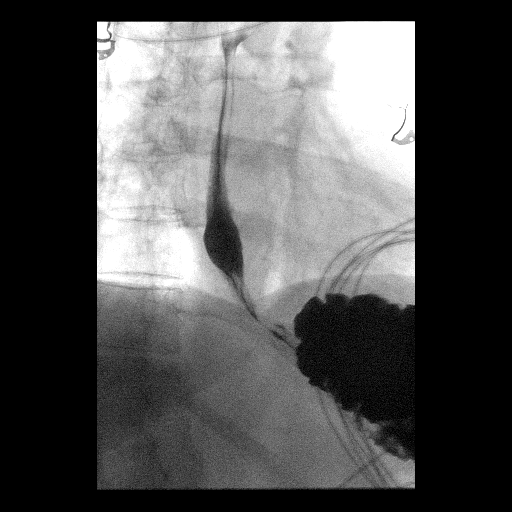

[Series 5: cp_standard · 0.56mm/px · 4 of 92 frames shown (4 of 6)]
[frame 9/92]
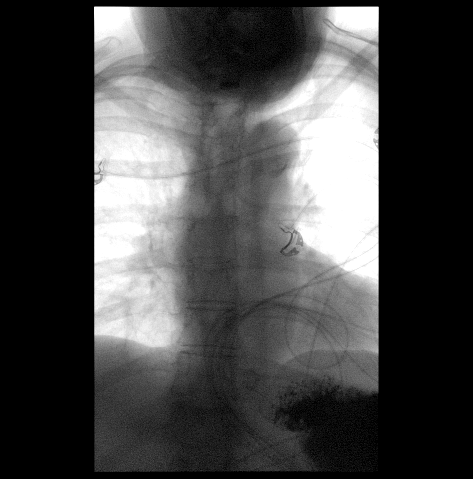
[frame 14/92]
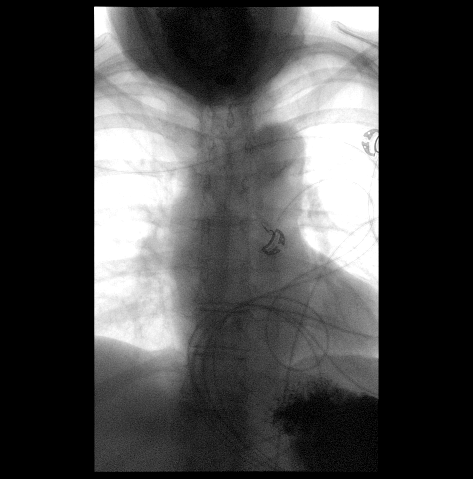
[frame 47/92]
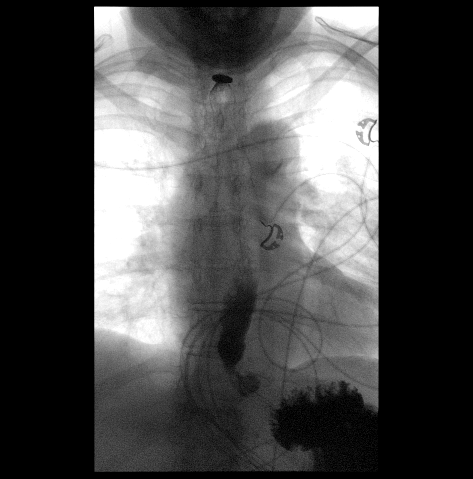
[frame 79/92]
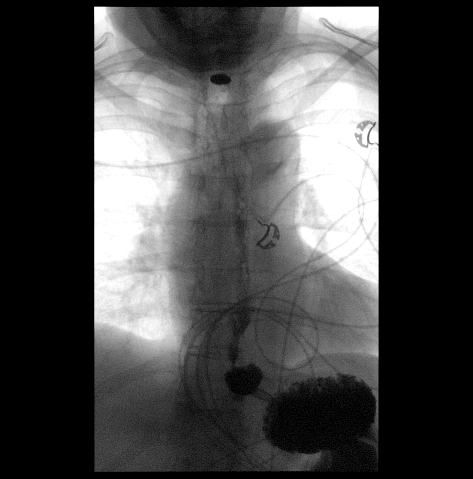

[Series 7: cp_standard · 0.58mm/px · 3 of 70 frames shown (5 of 6)]
[frame 11/70]
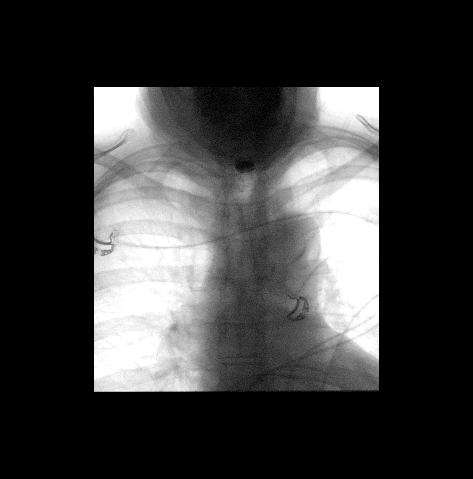
[frame 36/70]
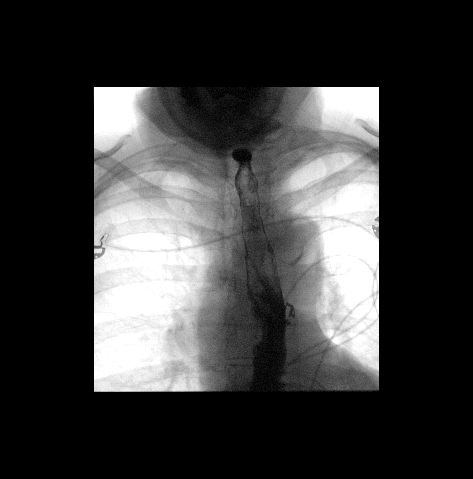
[frame 60/70]
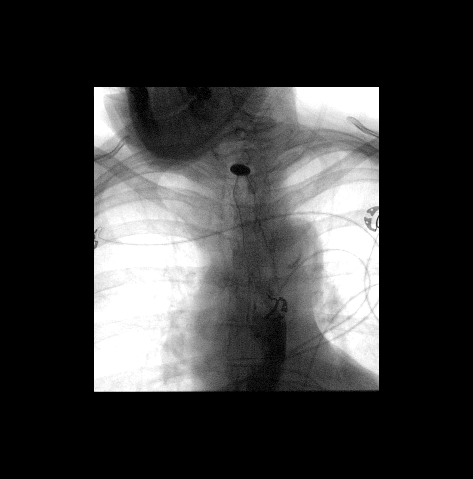

[Series 8: cp_standard · 0.58mm/px · 3 of 42 frames shown (6 of 6)]
[frame 7/42]
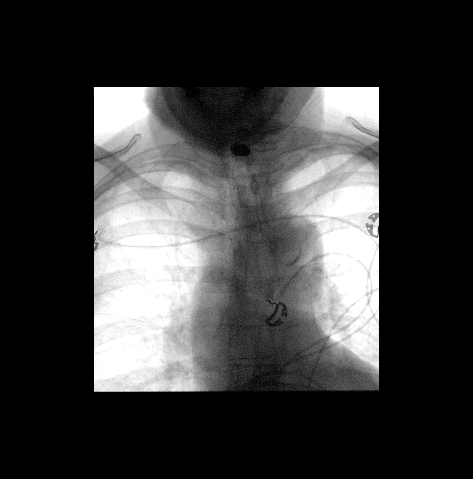
[frame 31/42]
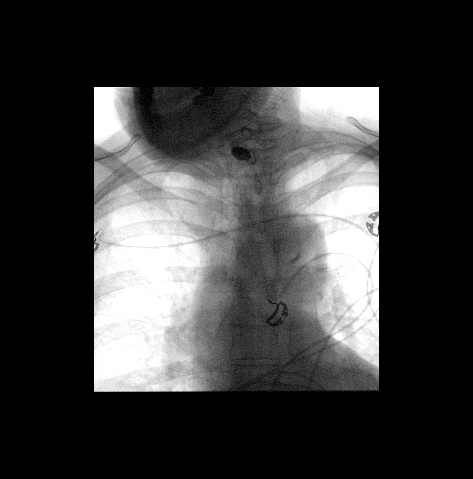
[frame 36/42]
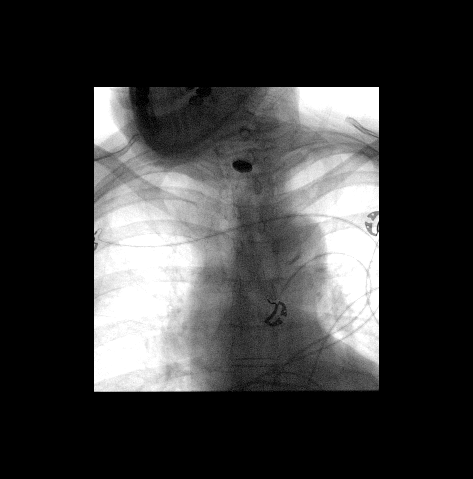

[19 of 24 positions shown; findings below may reference images not displayed]

FINDINGS: Limited evaluation as patient is nonweightbearing with limited
mobility.

Swallowing: Grossly unremarkable on this limited study. No
aspiration was observed.

Pharynx: Unremarkable.

Esophagus: Approximately 1 cm long fixed segment of moderate, smooth
with narrowing in the cervical esophagus. No evidence of a
significant stricture or gross mass more distally.

Esophageal motility: Mild-moderate dysmotility evidenced by
retropulsion of barium bolus as well as tertiary contractions.

Hiatal Hernia: Small sliding hiatal hernia.

Gastroesophageal reflux: None visualized during this limited exam.

Ingested 13 mm barium tablet: Became stuck at the site of the
cervical stricture described above and did not pass despite
ingestion of additional water, thin barium, and applesauce.
IMPRESSION: 1. Stricture in the cervical esophagus through which a barium tablet
would not pass.
2. Mild-to-moderate esophageal dysmotility.
3. Small sliding hiatal hernia.

## 2021-07-11 IMAGING — MR MR CARD MORPHOLOGY WO/W CM
30 of 33 series · 34 of 40 positions shown · IV contrast (gadavist)
Comparison: none

CLINICAL DATA: Congenital heart disease, known or suspected
Evaluation hypoxia notes positive bubble study. CI for BUZIN therefore
cmRI to evaluate for P:FO/ASD and associated anomalies.

EXAM:
CARDIAC MRI
TECHNIQUE: The patient was scanned on a 1.5 Tesla GE magnet. A dedicated
cardiac coil was used. Functional imaging was done using [employer]
sequences. [DATE], and 4 chamber views were done to assess for RWMA's.
Modified BUZIN rule using a short axis stack was used to
calculate an ejection fraction on a dedicated work station using
[employer] software. The patient received 7mL GADAVIST GADOBUTROL 1
MMOL/ML IV SOLN. After 10 minutes inversion recovery sequences were
used to assess for infiltration and scar tissue.

[Series 3: t2_haste_db_tra_bh · axial · 8.0mm · 1.41mm/px · z∈[-166,+14]mm · 2 of 16 slices shown]
[im 1/16]
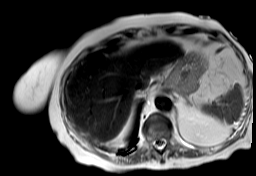
[im 16/16]
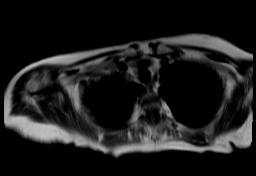

[Series 7: bSSFP · oblique · 8.0mm · 1.61mm/px · 2 of 25 slices shown (1 of 15)]
[im 1/25]
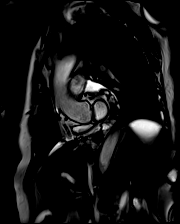
[im 25/25]
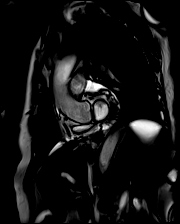

[Series 8: bSSFP · oblique · 8.0mm · 1.61mm/px · 2 of 25 slices shown (2 of 15)]
[im 1/25]
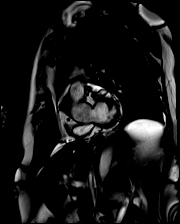
[im 25/25]
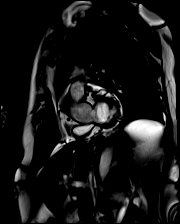

[Series 9: bSSFP · oblique · 8.0mm · 1.61mm/px · 2 of 25 slices shown (3 of 15)]
[im 1/25]
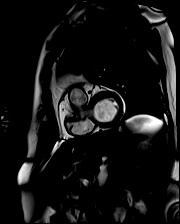
[im 25/25]
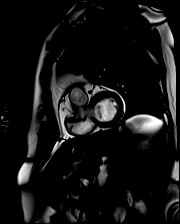

[Series 10: bSSFP · oblique · 8.0mm · 1.61mm/px · 1 of 25 slices shown (4 of 15)]
[im 1/25]
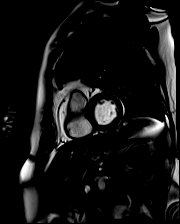

[Series 11: bSSFP · oblique · 8.0mm · 1.61mm/px · 1 of 25 slices shown (5 of 15)]
[im 1/25]
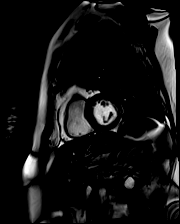

[Series 12: bSSFP · oblique · 8.0mm · 1.61mm/px · 1 of 25 slices shown (6 of 15)]
[im 1/25]
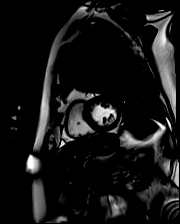

[Series 13: bSSFP · oblique · 8.0mm · 1.61mm/px · 1 of 25 slices shown (7 of 15)]
[im 1/25]
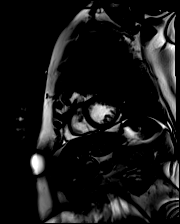

[Series 14: bSSFP · oblique · 8.0mm · 1.61mm/px · 1 of 25 slices shown (8 of 15)]
[im 1/25]
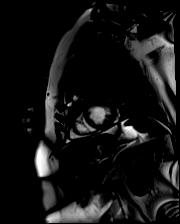

[Series 15: bSSFP · oblique · 8.0mm · 1.61mm/px · 1 of 25 slices shown (9 of 15)]
[im 1/25]
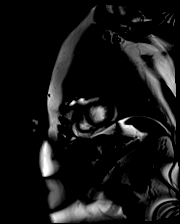

[Series 16: bSSFP · oblique · 8.0mm · 1.61mm/px · 1 of 25 slices shown (10 of 15)]
[im 1/25]
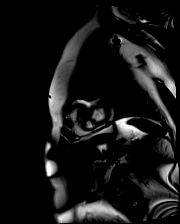

[Series 17: bSSFP · oblique · 8.0mm · 1.61mm/px · 1 of 25 slices shown (11 of 15)]
[im 1/25]
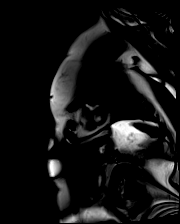

[Series 18: bSSFP · oblique · 8.0mm · 1.61mm/px · 1 of 25 slices shown (12 of 15)]
[im 1/25]
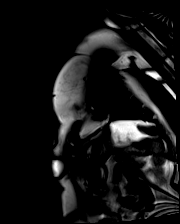

[Series 19: bSSFP · oblique · 8.0mm · 1.61mm/px · 1 of 25 slices shown (13 of 15)]
[im 1/25]
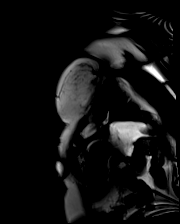

[Series 20: bSSFP · axial · 6.0mm · 1.41mm/px · 1 of 25 slices shown (14 of 15)]
[im 1/25]
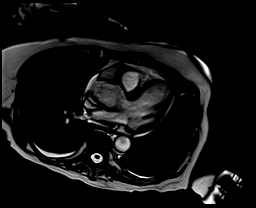

[Series 21: bSSFP · coronal · 6.0mm · 1.41mm/px · 1 of 25 slices shown (15 of 15)]
[im 1/25]
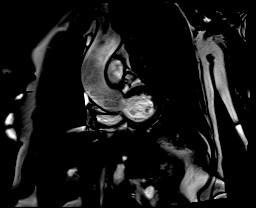

[Series 22: cine rvot · sagittal · 6.0mm · 1.41mm/px · 1 of 25 slices shown]
[im 1/25]
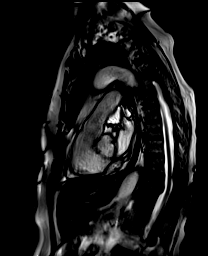

[Series 23: cor rvot · coronal · 6.0mm · 1.41mm/px · 1 of 25 slices shown]
[im 1/25]
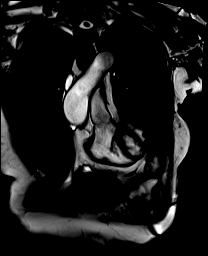

[Series 24: aortic valve flow_200_tp_retro_bh · oblique · 6.0mm · 1.73mm/px · 1 of 30 slices shown (1 of 2)]
[im 1/30]
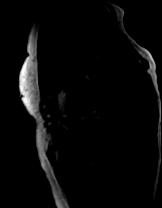

[Series 25: aortic valve flow_200_tp_retro_bh_mag · oblique · 6.0mm · 1.73mm/px · 1 of 28 slices shown (1 of 2)]
[im 1/28]
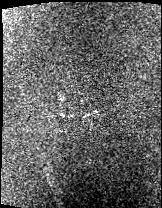

[Series 26: aortic valve flow_200_tp_retro_bh_p · oblique · 6.0mm · 1.73mm/px · 1 of 30 slices shown (1 of 2)]
[im 1/30]
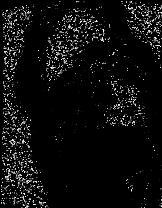

[Series 27: aortic valve flow_200_tp_retro_bh · oblique · 6.0mm · 1.73mm/px · 1 of 30 slices shown (2 of 2)]
[im 1/30]
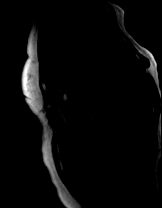

[Series 28: aortic valve flow_200_tp_retro_bh_mag · oblique · 6.0mm · 1.73mm/px · 1 of 24 slices shown (2 of 2)]
[im 1/24]
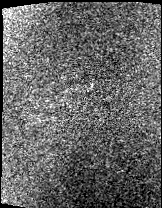

[Series 29: aortic valve flow_200_tp_retro_bh_p · oblique · 6.0mm · 1.73mm/px · 1 of 30 slices shown (2 of 2)]
[im 1/30]
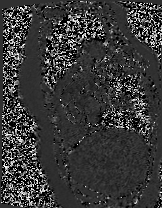

[Series 30: pulmonary valve flow_150_tp_retro_bh · axial · 6.0mm · 1.73mm/px · 1 of 30 slices shown (1 of 2)]
[im 1/30]
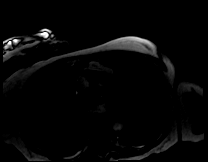

[Series 31: pulmonary valve flow_150_tp_retro_bh_mag · axial · 6.0mm · 1.73mm/px · 1 of 29 slices shown (1 of 2)]
[im 1/29]
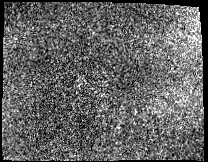

[Series 32: pulmonary valve flow_150_tp_retro_bh_p · axial · 6.0mm · 1.73mm/px · 1 of 30 slices shown (1 of 2)]
[im 1/30]
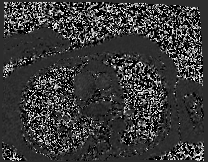

[Series 33: pulmonary valve flow_150_tp_retro_bh · axial · 6.0mm · 1.73mm/px · 1 of 30 slices shown (2 of 2)]
[im 1/30]
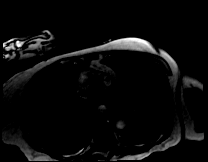

[Series 34: pulmonary valve flow_150_tp_retro_bh_mag · axial · 6.0mm · 1.73mm/px · 1 of 29 slices shown (2 of 2)]
[im 1/29]
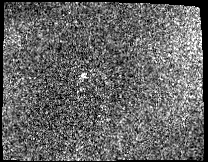

[Series 35: pulmonary valve flow_150_tp_retro_bh_p · axial · 6.0mm · 1.73mm/px · 1 of 30 slices shown (2 of 2)]
[im 1/30]
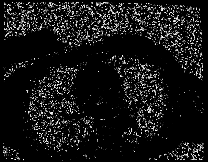

[34 of 40 positions shown; findings below may reference images not displayed]

This examination is tailored for evaluation cardiac anatomy and
function and provides very limited assessment of noncardiac
structures, which are accordingly not evaluated during
interpretation. If there is clinical concern for extracardiac
pathology, further evaluation with CT imaging should be considered.
FINDINGS: LEFT VENTRICLE:

Normal left ventricular chamber size.

Normal left ventricular wall thickness.

Normal left ventricular systolic function.

LVEF = 50%, visually appears 55-60%. Quantitation may be impacted by
motion artifact.

There are no regional wall motion abnormalities.

No myocardial edema, T2 = 54 msec, upper limit of scanner normal

Normal first pass perfusion.

There is a small focus of post contrast delayed myocardial
enhancement, inferobasal that is midmyocardial and focal. This is
nonspecific and in one segment. Undetermined significance.

Normal T1 myocardial nulling kinetics suggest against a diagnosis of
cardiac amyloidosis.

ECV = 30%, nonspecific elevation

RIGHT VENTRICLE:

Normal right ventricular chamber size.

Normal right ventricular wall thickness.

Normal right ventricular systolic function.

RVEF = 51%

There are no regional wall motion abnormalities.

No post contrast delayed myocardial enhancement.

ATRIA:

Normal left atrial size.

Normal right atrial size.

VALVES:

No significant valvular abnormalities.  Tricuspid aortic valve.

PERICARDIUM:

Normal pericardium.  Small pericardial effusion.

OTHER: No significant extracardiac findings.

MEASUREMENTS:
Qp/Qs:

Left ventricle:

LV Female

LV EF: 50% (Normal 56-78%)

Absolute volumes:

LV EDV: 79mL (Normal 52-141 mL)

LV ESV: 40mL (Normal 13-51 mL)

LV SV: 40mL (Normal 33-97 mL)

CO: 2.4L/min (Normal 2.7-6.0 L/min)

Indexed volumes:

LV EDV: 49mL/sq-m (Normal 41-81 mL/sq-m)

LV ESV: 24mL/sq-m (Normal 12-21 mL/sq-m)

LV SV: 25mL/sq-m (Normal 26-56 mL/sq-m)

CI: 1.47L/min/sq-m (Normal 1.8-3.8 L/min/sq-m)

Right ventricle:

RV female

RV EF: 51% (Normal 47-80%)

Absolute volumes:

RV EDV: 73 mL (Normal 58-154 mL)

RV ESV: 36 mL (Normal 12-68 mL)

RV SV: 37 mL (Normal 35-98 mL)

CO: 2.2 L/min (Normal 2.7-6 L/min)

Indexed volumes:

RV EDV: 45 ML/sq-m (Normal 48-87 mL/sq-m)

RV ESV: 22 mL/sq-m (Normal 11-28 mL/sq-m)

RV SV: 23 mL/sq-m (Normal 27-57 mL/sq-m)

CI: 1.38 L/min/sq-m (Normal 1.8-3.8 L/min/sq-m)
IMPRESSION: 1.  Normal biventricular chamber size and function.

2.  Qp/Qs is 0.93.

3. No ASD seen. No definite anomalous pulmonary venous return. No
findings to suggest congenital heart lesions or intracardiac shunt.

## 2021-07-11 NOTE — Progress Notes (Signed)
   07/11/21 0727  Orthostatic Lying   BP- Lying 111/90  Pulse- Lying 91  Orthostatic Sitting  BP- Sitting (!) 127/99  Pulse- Sitting 92  Orthostatic Standing at 0 minutes  Pulse- Standing at 0 minutes 103  Orthostatic Standing at 3 minutes  BP- Standing at 3 minutes 121/70  Pulse- Standing at 3 minutes 105   Unable to obtain an accurate blood pressure while patient standing due to patient clenching walker for support. BP 121/70 when she returned to a sitting position.

## 2021-07-11 NOTE — Evaluation (Signed)
Occupational Therapy Evaluation Patient Details Name: Monique Ballard MRN: [id number] DOB: [birth year] Today's Date: 07/11/2021   History of Present Illness Pt is a 81 y/o female admitted secondary to fall and hypoxia. Pt found to have R distal fibular fx and was splinted. Pt also with some hallucinations during hospitalization, but that has since improved. PMH includes breast cancer, alcohol dependency, depression/anxiety.   Clinical Impression   Pt was ambulating with a cane and independent in self care prior to admission. Presents with generalized weakness, decreased standing balance and impaired cognition. Pt requires mod assist to stand and take side steps to [facility] with RW and multimodal cues to maintain NWB. Pt requires set up to total assist for ADLs. The plan is for her to return home with her family's assist. Recommending HHOT. Pt on RA upon arrival with Sp02 of 96%, did not see appreciable change with activity, although waveform variable.      Recommendations for follow up therapy are one component of a multi-disciplinary discharge planning process, led by the attending physician.  Recommendations may be updated based on patient status, additional functional criteria and insurance authorization.   Follow Up Recommendations  Home health OT    Assistance Recommended at Discharge Frequent or constant Supervision/Assistance  Patient can return home with the following A lot of help with walking and/or transfers;A lot of help with bathing/dressing/bathroom;Direct supervision/assist for financial management;Assist for transportation;Help with stairs or ramp for entrance;Assistance with cooking/housework;Direct supervision/assist for medications management    Functional Status Assessment  Patient has had a recent decline in their functional status and demonstrates the ability to make significant improvements in function in a reasonable and predictable amount of time.  Equipment  Recommendations  Wheelchair (measurements OT);Wheelchair cushion (measurements OT)    Recommendations for Other Services       Precautions / Restrictions Precautions Precautions: Fall Restrictions Weight Bearing Restrictions: Yes RLE Weight Bearing: Non weight bearing      Mobility Bed Mobility Overal bed mobility: Needs Assistance Bed Mobility: Supine to Sit, Sit to Supine     Supine to sit: Min guard Sit to supine: Min guard   General bed mobility comments: cues to use rail, no physical assist    Transfers Overall transfer level: Needs assistance Equipment used: Rolling walker (2 wheels) Transfers: Sit to/from Stand Sit to Stand: Mod assist, +2 safety/equipment           General transfer comment: cues for hand placement, assist to rise and steady      Balance Overall balance assessment: Needs assistance   Sitting balance-Leahy Scale: Good     Standing balance support: Bilateral upper extremity supported Standing balance-Leahy Scale: Poor Standing balance comment: Reliant on UE and external support                           ADL either performed or assessed with clinical judgement   ADL Overall ADL's : Needs assistance/impaired Eating/Feeding: Set up;Bed level   Grooming: Set up;Sitting   Upper Body Bathing: Minimal assistance;Sitting   Lower Body Bathing: Sit to/from stand;Total assistance   Upper Body Dressing : Minimal assistance;Sitting   Lower Body Dressing: Total assistance;Sit to/from stand       Toileting- Water quality scientist and Hygiene: Total assistance;Sit to/from stand       Functional mobility during ADLs: +2 for physical assistance;Minimal assistance       Vision Ability to See in Adequate Light: 0 Adequate Patient Visual Report: No  change from baseline       Perception     Praxis      Pertinent Vitals/Pain Pain Assessment Pain Assessment: Faces Faces Pain Scale: Hurts a little bit Pain Location: R  ankle Pain Descriptors / Indicators: Discomfort Pain Intervention(s): Monitored during session     Hand Dominance Right   Extremity/Trunk Assessment Upper Extremity Assessment Upper Extremity Assessment: Overall WFL for tasks assessed;Generalized weakness           Communication Communication Communication: No difficulties (minimally verbal this session)   Cognition Arousal/Alertness: Awake/alert Behavior During Therapy: WFL for tasks assessed/performed Overall Cognitive Status: Impaired/Different from baseline Area of Impairment: Problem solving, Following commands                       Following Commands: Follows one step commands with increased time     Problem Solving: Slow processing, Decreased initiation, Difficulty sequencing, Requires verbal cues       General Comments       Exercises     Shoulder Instructions      Home Living Family/patient expects to be discharged to:: Private residence Living Arrangements: Spouse/significant other;Children Available Help at Discharge: Family;Available 24 hours/day Type of Home: House Home Access: Stairs to enter [employer] of Steps: 2 Entrance Stairs-Rails: Left;Right;Can reach both Home Layout: One level     Bathroom Shower/Tub: Occupational psychologist: Standard     Home Equipment: Grab bars - toilet;BSC/3in1;Grab bars - tub/shower;Shower seat;Cane - quad;Rollator (4 wheels)          Prior Functioning/Environment Prior Level of Function : Independent/Modified Independent             Mobility Comments: Used cane for ambulation          OT Problem List: Decreased strength;Impaired balance (sitting and/or standing);Decreased knowledge of use of DME or AE;Pain;Decreased cognition;Decreased safety awareness      OT Treatment/Interventions: Self-care/ADL training;DME and/or AE instruction;Therapeutic activities;Patient/family education;Balance training    OT  Goals(Current goals can be found in the care plan section) Acute Rehab OT Goals OT Goal Formulation: With patient/family Time For Goal Achievement: 07/25/21 Potential to Achieve Goals: Good ADL Goals Pt Will Perform Grooming: with set-up;sitting (at sink) Pt Will Perform Lower Body Bathing: with min assist;sitting/lateral leans Pt Will Perform Lower Body Dressing: with min assist;sitting/lateral leans Pt Will Transfer to Toilet: with min assist;ambulating;bedside commode Pt Will Perform Toileting - Clothing Manipulation and hygiene: with min assist;sitting/lateral leans Additional ADL Goal #1: Pt will adhere to NWB on R LE with min verbal cues.  OT Frequency: Min 2X/week    Co-evaluation              AM-PAC OT "6 Clicks" Daily Activity     Outcome Measure Help from another person eating meals?: A Little Help from another person taking care of personal grooming?: A Little Help from another person toileting, which includes using toliet, bedpan, or urinal?: Total Help from another person bathing (including washing, rinsing, drying)?: A Lot Help from another person to put on and taking off regular upper body clothing?: A Little Help from another person to put on and taking off regular lower body clothing?: Total 6 Click Score: 13   End of Session Equipment Utilized During Treatment: Rolling walker (2 wheels)  Activity Tolerance: Patient tolerated treatment well Patient left: in bed (going to test)  OT Visit Diagnosis: Unsteadiness on feet (R26.81);Muscle weakness (generalized) (M62.81);Other symptoms and signs involving cognitive function  Time: 3943-2003 OT Time Calculation (min): 25 min Charges:  OT General Charges $OT Visit: 1 Visit OT Evaluation $OT Eval Moderate Complexity: Daisy, OTR/L Acute Rehabilitation Services Office: [phone]  Malka So 07/11/2021, 9:54 AM

## 2021-07-11 NOTE — Progress Notes (Signed)
PROGRESS NOTE    Monique Ballard  XAJ:[id number] DOB: 11/12/1940 DOA: 07/08/2021 PCP: Colon Branch, MD    Brief Narrative:  This 81 years old female with PMH significant for chronic hypoxic respiratory failure, noncompliant with home oxygen, alcohol dependence, breast cancer status postlumpectomy and chemotherapy, GERD, anxiety/depression presented with exertional shortness of breath for 2 to 3 days.  At baseline she has chronic exertional dyspnea but has worsened in the last few days.  Patient was using oxygen on and off for last 2 years.  She has not followed up with any pulmonologist.  Patient also sustained a mechanical fall and hit her right ankle and started to feel pain. Patient was admitted for acute on chronic hypoxic respiratory failure requiring BiPAP and nondisplaced right distal fibular fracture. She is successfully weaned down to high flow nasal cannula at 3 L.  Chest x-ray without any infiltrates.  CTA negative for PE.  It was discussed with [facility] Dr. Lyla Glassing who recommended to splinter and nonweightbearing follow-up Dr. Kathaleen Bury in 7 days.  Echocardiogram shows intra-atrial shunt.  Cardiology is consulted for TEE.  Patient does have a history of esophageal strictures in the past,  required esophageal dilatation.  GI is consulted for evaluation before TEE.  Esophagogram shows strictures within the cervical esophagus, it can be addressed outpatient.  As per cardiology patient will proceed with evaluation of shunt but it does not require TEE given narrow cervical esophageal stricture. Patient is now scheduled for RHC and intracardiac echo to further evaluate and treat possible PFO/ASD given contraindication for TEE.  Assessment & Plan:   Principal Problem:   Hypoxia Active Problems:   Alcohol dependency ([place])   Acute respiratory failure (HCC)  Acute on chronic hypoxic respiratory failure: She was severely hypoxic requiring BiPAP on arrival.  Now weaned down to HFNC  15L Etiology unknown.  Patient was evaluated by PCCM, initial impression was hepatopulmonary syndrome versus other shunt conditions.   Recommended echocardiogram and RUQ ultrasound. RUQ ultrasound: Single 6 mm gallstone without acute cholecystitis.  Echo shows LVEF 55 to 60%, no RWMA.  Intra-atrial shunt noted. Patient also found to have polycythemia which could be causing chronic hypoxia. CTA chest ruled out pulmonary embolism.  Other differential diagnosis pulmonary hypertension. She is successfully weaned down to 3 L high flow nasal cannula sats 94%.  Wean as tolerated.  Intra atrial shunt : Cardiology is consulted, tentatively scheduled for TEE today GI was consulted given history of esophageal strictures in the past, required dilatation.  Esophagogram shows strictures within the cervical esophagus, it can be addressed outpatient Patient is now scheduled for RHC and intracardiac echo to further evaluate and treat possible PFO/ASD given contraindication for TEE.  Right ankle fracture: X-ray showed nondisplaced right distal fibular fracture. Discussed with [facility] Dr. Lyla Glassing, recommended splint for now and nonweightbearing. Follow-up Dr. Kathaleen Bury in 7 days.  Hypokalemia: Replaced.  Continue to monitor  Polycythemia: Likely causing chronic hypoxia.  Elevated lactic acid: > Resolved. Patient appears euvolemic on exam.  Assessed by PCCM, concerned about underlying liver disease.   Avoid aggressive hydration at this point. Chest x-ray no infiltrate, no urinary symptoms, monitor off antibiotics. Lactic acid normalized with IV hydration.  Sinus tachycardia: Patient appears euvolemic,  no further hydration. Continue low-dose metoprolol.  Elevated troponin: Could be in the setting of hypoxia, denies any chest pain. Troponin trending down 30> 20.  EKG without ischemic changes 2D echocardiogram shows LVEF 55 to 60%.  No RWMA.  EtOH abuse: No signs  and symptoms of active  withdrawal.   Continue CIWA protocol.  DVT prophylaxis: Lovenox. Code Status: Full code. Family Communication: No family at bed side. Disposition Plan:    Status is: Inpatient Remains inpatient appropriate because:  Admitted for acute on chronic hypoxic respiratory failure requiring BiPAP now weaned down to HFNC 3 L.  Patient continued on CIWA protocol for alcohol withdrawal.  Echo shows intra-atrial shunt, scheduled for RHC and intracardiac echo tomorrow to further evaluate and treat possible PFO/ASD given contraindication for TEE.    Consultants:  Cardiology GI Pulmonology  Procedures:  Echo, RUQ ultrasound Antimicrobials:   Anti-infectives (From admission, onward)    None        Subjective: Patient was seen and examined at bedside.  Overnight events noted. Patient was sitting comfortably on the chair,  very interactive, fully conversant. Denies any chest pain shortness of breath or dizziness. .  Objective: Vitals:   07/11/21 0403 07/11/21 0727 07/11/21 0813 07/11/21 1237  BP: (!) 91/59 111/90 121/70 (!) 153/89  Pulse: 77 92  79  Resp: 17 (!) 25  20  Temp: 97.6 F (36.4 C) 97.6 F (36.4 C)  97.8 F (36.6 C)  TempSrc: Axillary Oral  Oral  SpO2: 95% 98%  92%  Weight:      Height:        Intake/Output Summary (Last 24 hours) at 07/11/2021 1614 Last data filed at 07/11/2021 1302 Gross per 24 hour  Intake 50 ml  Output 550 ml  Net -500 ml   Filed Weights   07/08/21 1843  Weight: 59.1 kg    Examination:  General exam: Appears comfortable, not in any acute distress.  Deconditioned Respiratory system: CTA bilaterally, no accessory muscle use, normal respiratory effort. Cardiovascular system: S1-S2 heard, regular rate and rhythm, no murmur. Gastrointestinal system: Abdomen is soft, non tender, non distended, BS+ Central nervous system: Alert, oriented x 3, no focal neurological deficits. Extremities: No edema, no cyanosis, no clubbing. Skin: No rashes,  lesions or ulcers Psychiatry: Mood and judgment normal.    Data Reviewed: I have personally reviewed following labs and imaging studies  CBC: Recent Labs  Lab 07/08/21 0932 07/08/21 0949 07/08/21 1000 07/10/21 0257  WBC 10.7*  --   --  13.0*  NEUTROABS 6.6  --   --   --   HGB 16.9* 16.3* 18.0* 14.0  HCT 48.3* 48.0* 53.0* 42.4  MCV 91.1  --   --  95.1  PLT 278  --   --  196   Basic Metabolic Panel: Recent Labs  Lab 07/08/21 0932 07/08/21 0949 07/08/21 1000 07/08/21 1132 07/10/21 0257  NA 134* 131* 133*  --  136  K 3.3* 3.3* 3.2*  --  3.5  CL 98  --  99  --  108  CO2 20*  --   --   --  21*  GLUCOSE 137*  --  133*  --  85  BUN 16  --  17  --  15  CREATININE 1.03*  --  0.70  --  0.75  CALCIUM 9.8  --   --   --  9.2  MG  --   --   --  1.9 1.9  PHOS  --   --   --   --  2.9   GFR: Estimated Creatinine Clearance: 47.9 mL/min (by C-G formula based on SCr of 0.75 mg/dL). Liver Function Tests: Recent Labs  Lab 07/08/21 0932 07/10/21 0257  AST 25 23  ALT  22 21  ALKPHOS 70 49  BILITOT 2.1* 0.7  PROT 7.3 5.8*  ALBUMIN 3.6 2.9*   No results for input(s): "LIPASE", "AMYLASE" in the last 168 hours. No results for input(s): "AMMONIA" in the last 168 hours. Coagulation Profile: Recent Labs  Lab 07/08/21 0932  INR 1.1   Cardiac Enzymes: No results for input(s): "CKTOTAL", "CKMB", "CKMBINDEX", "TROPONINI" in the last 168 hours. BNP (last 3 results) No results for input(s): "PROBNP" in the last 8760 hours. HbA1C: No results for input(s): "HGBA1C" in the last 72 hours. CBG: No results for input(s): "GLUCAP" in the last 168 hours. Lipid Profile: No results for input(s): "CHOL", "HDL", "LDLCALC", "TRIG", "CHOLHDL", "LDLDIRECT" in the last 72 hours. Thyroid Function Tests: Recent Labs    07/08/21 1803  TSH 1.324   Anemia Panel: No results for input(s): "VITAMINB12", "FOLATE", "FERRITIN", "TIBC", "IRON", "RETICCTPCT" in the last 72 hours. Sepsis Labs: Recent Labs   Lab 07/08/21 0932 07/08/21 1132 07/08/21 1450 07/08/21 1804  LATICACIDVEN 2.3* 4.5* 4.2* 1.5    Recent Results (from the past 240 hour(s))  Culture, blood (Routine x 2)     Status: None (Preliminary result)   Collection Time: 07/08/21  9:32 AM   Specimen: BLOOD  Result Value Ref Range Status   Specimen Description BLOOD LEFT ANTECUBITAL  Final   Special Requests   Final    BOTTLES DRAWN AEROBIC AND ANAEROBIC Blood Culture results may not be optimal due to an inadequate volume of blood received in culture bottles   Culture   Final    NO GROWTH 3 DAYS Performed at [facility] [address], [place], [place] [address]    Report Status PENDING  Incomplete  SARS Coronavirus 2 by RT PCR (hospital order, performed in [facility] hospital lab) *cepheid single result test* Anterior Nasal Swab     Status: None   Collection Time: 07/08/21  9:33 AM   Specimen: Anterior Nasal Swab  Result Value Ref Range Status   SARS Coronavirus 2 by RT PCR NEGATIVE NEGATIVE Final    Comment: (NOTE) SARS-CoV-2 target nucleic acids are NOT DETECTED.  The SARS-CoV-2 RNA is generally detectable in upper and lower respiratory specimens during the acute phase of infection. The lowest concentration of SARS-CoV-2 viral copies this assay can detect is 250 copies / mL. A negative result does not preclude SARS-CoV-2 infection and should not be used as the sole basis for treatment or other patient management decisions.  A negative result may occur with improper specimen collection / handling, submission of specimen other than nasopharyngeal swab, presence of viral mutation(s) within the areas targeted by this assay, and inadequate number of viral copies (<250 copies / mL). A negative result must be combined with clinical observations, patient history, and epidemiological information.  Fact Sheet for Patients:   https://www.patel.info/  Fact Sheet for Healthcare  Providers: https://hall.com/  This test is not yet approved or  cleared by the [place] FDA and has been authorized for detection and/or diagnosis of SARS-CoV-2 by FDA under an Emergency Use Authorization (EUA).  This EUA will remain in effect (meaning this test can be used) for the duration of the COVID-19 declaration under Section 564(b)(1) of the Act, 21 U.S.C. section 360bbb-3(b)(1), unless the authorization is terminated or revoked sooner.  Performed at [facility], [place] [address] [place], [place] [address]   Culture, blood (Routine x 2)     Status: None (Preliminary result)   Collection Time: 07/08/21 10:13 AM  Specimen: BLOOD RIGHT WRIST  Result Value Ref Range Status   Specimen Description BLOOD RIGHT WRIST  Final   Special Requests   Final    BOTTLES DRAWN AEROBIC AND ANAEROBIC Blood Culture results may not be optimal due to an inadequate volume of blood received in culture bottles   Culture   Final    NO GROWTH 3 DAYS Performed at [facility], [place] [address] [place], [place] [address]    Report Status PENDING  Incomplete    Radiology Studies: MR CARDIAC VELOCITY FLOW MAP  Result Date: 07/11/2021 CLINICAL DATA:  Congenital heart disease, known or suspected EXAM: CARDIAC MRI TECHNIQUE: The patient was scanned on a 1.5 Tesla GE magnet. A dedicated cardiac coil was used. Functional imaging was done using Fiesta sequences. 2,3, and 4 chamber views were done to assess for RWMA's. Modified Simpson's rule using a short axis stack was used to calculate an ejection fraction on a dedicated work Conservation officer, nature. The patient received . After 10 minutes inversion recovery sequences were used to assess for infiltration and scar tissue. This examination is tailored for evaluation cardiac anatomy and function and provides very limited assessment of noncardiac structures, which are accordingly not evaluated during interpretation. If  there is clinical concern for extracardiac pathology, further evaluation with CT imaging should be considered. FINDINGS: LEFT VENTRICLE: Normal left ventricular chamber size. Normal left ventricular wall thickness. Normal left ventricular systolic function. LVEF = 50%, visually appears 55-60%. Quantitation may be impacted by motion artifact. There are no regional wall motion abnormalities. No myocardial edema, T2 = 54 msec, upper limit of scanner normal Normal first pass perfusion. There is a small focus of post contrast delayed myocardial enhancement, inferobasal that is midmyocardial and focal. This is nonspecific and in one segment. Undetermined significance. Normal T1 myocardial nulling kinetics suggest against a diagnosis of cardiac amyloidosis. ECV = 30%, nonspecific elevation RIGHT VENTRICLE: Normal right ventricular chamber size. Normal right ventricular wall thickness. Normal right ventricular systolic function. RVEF = 51% There are no regional wall motion abnormalities. No post contrast delayed myocardial enhancement. ATRIA: Normal left atrial size. Normal right atrial size. VALVES: No significant valvular abnormalities.  Tricuspid aortic valve. PERICARDIUM: Normal pericardium.  Small pericardial effusion. OTHER: No significant extracardiac findings. MEASUREMENTS: Qp/Qs: 0.93 Left ventricle: LV Female LV EF: 50% (Normal 56-78%) Absolute volumes: LV EDV: 25m (Normal 52-141 mL) LV ESV: 465m(Normal 13-51 mL) LV SV: 4046mNormal 33-97 mL) CO: 2.4L/min (Normal 2.7-6.0 L/min) Indexed volumes: LV EDV: 10m36m-m (Normal 41-81 mL/sq-m) LV ESV: 24mL66mm (Normal 12-21 mL/sq-m) LV SV: 25mL/61m (Normal 26-56 mL/sq-m) CI: 1.47L/min/sq-m (Normal 1.8-3.8 L/min/sq-m) Right ventricle: RV female RV EF: 51% (Normal 47-80%) Absolute volumes: RV EDV: 73 mL (Normal 58-154 mL) RV ESV: 36 mL (Normal 12-68 mL) RV SV: 37 mL (Normal 35-98 mL) CO: 2.2 L/min (Normal 2.7-6 L/min) Indexed volumes: RV EDV: 45 ML/sq-m (Normal 48-87  mL/sq-m) RV ESV: 22 mL/sq-m (Normal 11-28 mL/sq-m) RV SV: 23 mL/sq-m (Normal 27-57 mL/sq-m) CI: 1.38 L/min/sq-m (Normal 1.8-3.8 L/min/sq-m) IMPRESSION: 1.  Normal biventricular chamber size and function. 2.  Qp/Qs is 0.93. 3. No ASD seen. No definite anomalous pulmonary venous return. No findings to suggest congenital heart lesions or intracardiac shunt. Electronically Signed   By: GayatrCherlynn Kaiser  On: 07/11/2021 13:41   MR CARDIAC MORPHOLOGY W WO CONTRAST  Result Date: 07/11/2021 CLINICAL DATA:  Congenital heart disease, known or suspected Evaluation hypoxia notes positive bubble study. CI for TEE therefore  cmRI to evaluate for P:FO/ASD and associated anomalies. EXAM: CARDIAC MRI TECHNIQUE: The patient was scanned on a 1.5 Tesla GE magnet. A dedicated cardiac coil was used. Functional imaging was done using Fiesta sequences. 2,3, and 4 chamber views were done to assess for RWMA's. Modified Simpson's rule using a short axis stack was used to calculate an ejection fraction on a dedicated work Conservation officer, nature. The patient received 3m GADAVIST GADOBUTROL 1 MMOL/ML IV SOLN. After 10 minutes inversion recovery sequences were used to assess for infiltration and scar tissue. This examination is tailored for evaluation cardiac anatomy and function and provides very limited assessment of noncardiac structures, which are accordingly not evaluated during interpretation. If there is clinical concern for extracardiac pathology, further evaluation with CT imaging should be considered. FINDINGS: LEFT VENTRICLE: Normal left ventricular chamber size. Normal left ventricular wall thickness. Normal left ventricular systolic function. LVEF = 50%, visually appears 55-60%. Quantitation may be impacted by motion artifact. There are no regional wall motion abnormalities. No myocardial edema, T2 = 54 msec, upper limit of scanner normal Normal first pass perfusion. There is a small focus of post contrast delayed  myocardial enhancement, inferobasal that is midmyocardial and focal. This is nonspecific and in one segment. Undetermined significance. Normal T1 myocardial nulling kinetics suggest against a diagnosis of cardiac amyloidosis. ECV = 30%, nonspecific elevation RIGHT VENTRICLE: Normal right ventricular chamber size. Normal right ventricular wall thickness. Normal right ventricular systolic function. RVEF = 51% There are no regional wall motion abnormalities. No post contrast delayed myocardial enhancement. ATRIA: Normal left atrial size. Normal right atrial size. VALVES: No significant valvular abnormalities.  Tricuspid aortic valve. PERICARDIUM: Normal pericardium.  Small pericardial effusion. OTHER: No significant extracardiac findings. MEASUREMENTS: Qp/Qs: 0.93 Left ventricle: LV Female LV EF: 50% (Normal 56-78%) Absolute volumes: LV EDV: 770m(Normal 52-141 mL) LV ESV: 4045mNormal 13-51 mL) LV SV: 6m34mormal 33-97 mL) CO: 2.4L/min (Normal 2.7-6.0 L/min) Indexed volumes: LV EDV: 49mL44mm (Normal 41-81 mL/sq-m) LV ESV: 24mL/36m (Normal 12-21 mL/sq-m) LV SV: 25mL/s67m(Normal 26-56 mL/sq-m) CI: 1.47L/min/sq-m (Normal 1.8-3.8 L/min/sq-m) Right ventricle: RV female RV EF: 51% (Normal 47-80%) Absolute volumes: RV EDV: 73 mL (Normal 58-154 mL) RV ESV: 36 mL (Normal 12-68 mL) RV SV: 37 mL (Normal 35-98 mL) CO: 2.2 L/min (Normal 2.7-6 L/min) Indexed volumes: RV EDV: 45 ML/sq-m (Normal 48-87 mL/sq-m) RV ESV: 22 mL/sq-m (Normal 11-28 mL/sq-m) RV SV: 23 mL/sq-m (Normal 27-57 mL/sq-m) CI: 1.38 L/min/sq-m (Normal 1.8-3.8 L/min/sq-m) IMPRESSION: 1.  Normal biventricular chamber size and function. 2.  Qp/Qs is 0.93. 3. No ASD seen. No definite anomalous pulmonary venous return. No findings to suggest congenital heart lesions or intracardiac shunt. Electronically Signed   By: GayatriCherlynn Kaiser On: 07/11/2021 13:40   DG ESOPHAGUS W SINGLE CM (SOL OR THIN BA)  Result Date: 07/11/2021 CLINICAL DATA:  Dysphagia. Prior  history of Schatzki's ring in 2004. EXAM: ESOPHAGUS/BARIUM SWALLOW/TABLET STUDY TECHNIQUE: Single contrast examination was performed using thin liquid barium. This exam was performed by Kacie MBrynda Greathouseand was supervised and interpreted by Allen GLogan BoresLUOROSCOPY: Radiation Exposure Index (as provided by the fluoroscopic device): 7.3 mGy COMPARISON:  CT Angio Chest 07/08/2021 FINDINGS: Limited evaluation as patient is nonweightbearing with limited mobility. Swallowing: Grossly unremarkable on this limited study. No aspiration was observed. Pharynx: Unremarkable. Esophagus: Approximately 1 cm long fixed segment of moderate, smooth with narrowing in the cervical esophagus. No evidence of a significant stricture or gross mass more distally. Esophageal  motility: Mild-moderate dysmotility evidenced by retropulsion of barium bolus as well as tertiary contractions. Hiatal Hernia: Small sliding hiatal hernia. Gastroesophageal reflux: None visualized during this limited exam. Ingested 13 mm barium tablet: Became stuck at the site of the cervical stricture described above and did not pass despite ingestion of additional water, thin barium, and applesauce. IMPRESSION: 1. Stricture in the cervical esophagus through which a barium tablet would not pass. 2. Mild-to-moderate esophageal dysmotility. 3. Small sliding hiatal hernia. Electronically Signed   By: Logan Bores M.D.   On: 07/11/2021 12:41     Scheduled Meds:  aspirin EC  81 mg Oral Daily   enoxaparin (LOVENOX) injection  40 mg Subcutaneous Q24H   escitalopram  20 mg Oral Daily   folic acid  1 mg Oral Daily   metoprolol tartrate  25 mg Oral BID   multivitamin with minerals  1 tablet Oral Daily   pantoprazole  40 mg Oral Daily   Continuous Infusions:  thiamine injection 500 mg (07/11/21 1302)     LOS: 3 days    Time spent: 35 mins    Nosson Wender, MD [facility]   If 7PM-7AM, please contact night-coverage

## 2021-07-11 NOTE — Progress Notes (Addendum)
Progress Note  Patient Name: Monique Ballard Date of Encounter: 07/11/2021  Attending physician: Monique Clamp, MD Primary care provider: Colon Branch, MD  Subjective: Monique Ballard is a 81 y.o. female who was seen and examined at bedside  Was about to eat breakfast - recommended NPO.  No chest pain, shortness of breath MRI results pending.  GI evaluation pending  Daughter at bedside.  Case discussed and reviewed with her nurse.  Objective: Vital Signs in the last 24 hours: Temp:  [97.6 F (36.4 C)-98.2 F (36.8 C)] 97.6 F (36.4 C) (06/21 0727) Pulse Rate:  [66-92] 92 (06/21 0727) Resp:  [15-25] 25 (06/21 0727) BP: (91-121)/(59-90) 121/70 (06/21 0813) SpO2:  [95 %-100 %] 98 % (06/21 0727)  Intake/Output:  Intake/Output Summary (Last 24 hours) at 07/11/2021 0852 Last data filed at 07/11/2021 0600 Gross per 24 hour  Intake --  Output 550 ml  Net -550 ml    Net IO Since Admission: -450 mL [07/11/21 0852]  Weights:  Filed Weights   07/08/21 1843  Weight: 59.1 kg    Telemetry: Personally reviewed. SR  Physical examination: PHYSICAL EXAM: Today's Vitals   07/11/21 0020 07/11/21 0403 07/11/21 0727 07/11/21 0813  BP: 118/68 (!) 91/59 111/90 121/70  Pulse: 66 77 92   Resp: 15 17 (!) 25   Temp: 98.2 F (36.8 C) 97.6 F (36.4 C) 97.6 F (36.4 C)   TempSrc: Oral Axillary Oral   SpO2: 98% 95% 98%   Weight:      Height:      PainSc:       Body mass index is 23.08 kg/m.  Orthostatic VS:  Supine 111/90, HR 91 Sitting 127/99, HR 92 Standing 121/70 HR 105  CONSTITUTIONAL: Frail, appears older than stated age, hemodynamically stable, no acute distress, daughter present at bedside.   SKIN: Skin is warm and dry. No rash noted. No cyanosis. No pallor. No jaundice HEAD: Normocephalic and atraumatic.  EYES: No scleral icterus MOUTH/THROAT: Moist oral membranes.  NECK: No JVD present. No thyromegaly noted. No carotid bruits  CHEST Normal respiratory effort. No  intercostal retractions  LUNGS: Poor inspiratory effort, decreased breath sounds bilaterally, no wheezes rales or rhonchi's.  CARDIOVASCULAR: Regular, positive S1-S2, no murmurs rubs or gallops are appreciated.  ABDOMINAL: Soft, nontender, nondistended, positive bowel sounds in all 4 quadrants. EXTREMITIES: Left lower extremity no significant edema.  Right lower extremity wrapped in Ace bandage secondary to fracture.   HEMATOLOGIC: No significant bruising NEUROLOGIC: Oriented to person, place, and time. Nonfocal. Normal muscle tone.  PSYCHIATRIC: Normal mood and affect. Normal behavior. Cooperative No change in physical examination when compared to yesterday.   Lab Results: Hematology Recent Labs  Lab 07/08/21 0932 07/08/21 0949 07/08/21 1000 07/10/21 0257  WBC 10.7*  --   --  13.0*  RBC 5.30*  --   --  4.46  HGB 16.9* 16.3* 18.0* 14.0  HCT 48.3* 48.0* 53.0* 42.4  MCV 91.1  --   --  95.1  MCH 31.9  --   --  31.4  MCHC 35.0  --   --  33.0  RDW 13.3  --   --  13.5  PLT 278  --   --  235    Chemistry Recent Labs  Lab 07/08/21 0932 07/08/21 0949 07/08/21 1000 07/10/21 0257  NA 134* 131* 133* 136  K 3.3* 3.3* 3.2* 3.5  CL 98  --  99 108  CO2 20*  --   --  21*  GLUCOSE 137*  --  133* 85  BUN 16  --  17 15  CREATININE 1.03*  --  0.70 0.75  CALCIUM 9.8  --   --  9.2  PROT 7.3  --   --  5.8*  ALBUMIN 3.6  --   --  2.9*  AST 25  --   --  23  ALT 22  --   --  21  ALKPHOS 70  --   --  49  BILITOT 2.1*  --   --  0.7  GFRNONAA 56*  --   --  >60  ANIONGAP 16*  --   --  7     Cardiac Enzymes: Cardiac Panel (last 3 results) Recent Labs    07/08/21 0932 07/08/21 1132  TROPONINIHS 30* 20*    BNP (last 3 results) Recent Labs    07/08/21 0932  BNP 96.3    ProBNP (last 3 results) No results for input(s): "PROBNP" in the last 8760 hours.   DDimer No results for input(s): "DDIMER" in the last 168 hours.   Hemoglobin A1c:  Lab Results  Component Value Date    HGBA1C 4.1 03/03/2020   MPG 71 03/03/2020    TSH  Recent Labs    07/08/21 1803  TSH 1.324    Lipid Panel     Component Value Date/Time   CHOL 161 01/01/2018 1044   TRIG 197.0 (H) 01/01/2018 1044   HDL 47.70 01/01/2018 1044   CHOLHDL 3 01/01/2018 1044   VLDL 39.4 01/01/2018 1044   LDLCALC 74 01/01/2018 1044    Imaging: ECHOCARDIOGRAM COMPLETE BUBBLE STUDY  Result Date: 07/09/2021    ECHOCARDIOGRAM REPORT   Patient Name:   Monique Ballard Date of Exam: 07/09/2021 Medical Rec #:  [id number]       Height:       63.0 in Accession #:    [id number]      Weight:       130.3 lb Date of Birth:  02-11-1940      BSA:          1.612 m Patient Age:    33 years        BP:           132/82 mmHg Patient Gender: F               HR:           77 bpm. Exam Location:  Inpatient Procedure: 2D Echo, Cardiac Doppler, Color Doppler and Saline Contrast Bubble            Study Indications:    Respiratory failure  History:        Patient has no prior history of Echocardiogram examinations.                 Signs/Symptoms:Dyspnea. Hypoxia, alcohol dependency, GERD, hx                 breast cancer.  Sonographer:    Clayton Lefort RDCS (AE) Referring Phys: [place] Comments: Suboptimal subcostal window. IMPRESSIONS  1. Left ventricular ejection fraction, by estimation, is 55 to 60%. The left ventricle has normal function. The left ventricle has no regional wall motion abnormalities. Left ventricular diastolic parameters are consistent with Grade I diastolic dysfunction (impaired relaxation).  2. Right ventricular systolic function is normal. The right ventricular size is normal.  3. The mitral valve is normal in structure. No evidence of mitral valve regurgitation.  No evidence of mitral stenosis.  4. The aortic valve is tricuspid. There is mild calcification of the aortic valve. Aortic valve regurgitation is not visualized. No aortic stenosis is present.  5. The inferior vena cava is normal in size  with greater than 50% respiratory variability, suggesting right atrial pressure of 3 mmHg.  6. Agitated saline contrast bubble study was positive with shunting observed within 3-6 cardiac cycles suggestive of interatrial shunt. FINDINGS  Left Ventricle: Left ventricular ejection fraction, by estimation, is 55 to 60%. The left ventricle has normal function. The left ventricle has no regional wall motion abnormalities. The left ventricular internal cavity size was normal in size. There is  no left ventricular hypertrophy. Left ventricular diastolic parameters are consistent with Grade I diastolic dysfunction (impaired relaxation). Right Ventricle: The right ventricular size is normal. No increase in right ventricular wall thickness. Right ventricular systolic function is normal. Left Atrium: Left atrial size was normal in size. Right Atrium: Right atrial size was normal in size. Pericardium: There is no evidence of pericardial effusion. Mitral Valve: The mitral valve is normal in structure. No evidence of mitral valve regurgitation. No evidence of mitral valve stenosis. Tricuspid Valve: The tricuspid valve is normal in structure. Tricuspid valve regurgitation is not demonstrated. No evidence of tricuspid stenosis. Aortic Valve: The aortic valve is tricuspid. There is mild calcification of the aortic valve. Aortic valve regurgitation is not visualized. No aortic stenosis is present. Aortic valve mean gradient measures 2.0 mmHg. Aortic valve peak gradient measures 2.5 mmHg. Aortic valve area, by VTI measures 3.33 cm. Pulmonic Valve: The pulmonic valve was normal in structure. Pulmonic valve regurgitation is not visualized. No evidence of pulmonic stenosis. Aorta: The aortic root is normal in size and structure. Venous: The inferior vena cava is normal in size with greater than 50% respiratory variability, suggesting right atrial pressure of 3 mmHg. IAS/Shunts: No atrial level shunt detected by color flow Doppler.  Agitated saline contrast was given intravenously to evaluate for intracardiac shunting. Agitated saline contrast bubble study was positive with shunting observed within 3-6 cardiac cycles suggestive of interatrial shunt.  LEFT VENTRICLE PLAX 2D LVIDd:         4.40 cm   Diastology LVIDs:         3.60 cm   LV e' medial:    4.43 cm/s LV PW:         0.80 cm   LV E/e' medial:  7.2 LV IVS:        1.20 cm   LV e' lateral:   7.46 cm/s LVOT diam:     2.30 cm   LV E/e' lateral: 4.2 LV SV:         55 LV SV Index:   34 LVOT Area:     4.15 cm  RIGHT VENTRICLE RV Basal diam:  2.50 cm RV S prime:     9.57 cm/s TAPSE (M-mode): 1.8 cm LEFT ATRIUM           Index        RIGHT ATRIUM          Index LA diam:      2.50 cm 1.55 cm/m   RA Area:     8.76 cm LA Vol (A2C): 34.0 ml 21.10 ml/m  RA Volume:   15.00 ml 9.31 ml/m LA Vol (A4C): 15.9 ml 9.87 ml/m  AORTIC VALVE AV Area (Vmax):    3.45 cm AV Area (Vmean):   3.13 cm AV Area (VTI):  3.33 cm AV Vmax:           78.30 cm/s AV Vmean:          59.200 cm/s AV VTI:            0.166 m AV Peak Grad:      2.5 mmHg AV Mean Grad:      2.0 mmHg LVOT Vmax:         65.00 cm/s LVOT Vmean:        44.600 cm/s LVOT VTI:          0.133 m LVOT/AV VTI ratio: 0.80  AORTA Ao Root diam: 3.20 cm Ao Asc diam:  3.60 cm MITRAL VALVE MV Area (PHT): 5.31 cm    SHUNTS MV Decel Time: 143 msec    Systemic VTI:  0.13 m MV E velocity: 31.70 cm/s  Systemic Diam: 2.30 cm MV A velocity: 98.50 cm/s MV E/A ratio:  0.32 Glori Bickers MD Electronically signed by Glori Bickers MD Signature Date/Time: 07/09/2021/3:36:01 PM    Final     CARDIAC DATABASE: EKG: 07/08/2021: Sinus tachycardia, 110 bpm, left axis deviation, subtle ST-T changes in the lateral leads cannot rule out ischemia.   Echocardiogram: 07/09/2021: LVEF 55-60%, no regional wall motion abnormalities, grade 1 diastolic impairment, right ventricular size and function normal, no mitral stenosis, aortic valve sclerosis without stenosis, estimated  RAP 3 mmHg, positive sonicated saline study suggestive of intra-atrial shunt.  Scheduled Meds:  aspirin EC  81 mg Oral Daily   enoxaparin (LOVENOX) injection  40 mg Subcutaneous Q24H   escitalopram  20 mg Oral Daily   folic acid  1 mg Oral Daily   metoprolol tartrate  25 mg Oral BID   multivitamin with minerals  1 tablet Oral Daily   pantoprazole  40 mg Oral Daily    Continuous Infusions:  thiamine injection 500 mg (07/11/21 0408)    PRN Meds: acetaminophen, haloperidol, LORazepam **OR** LORazepam   IMPRESSION & RECOMMENDATIONS: Monique Ballard is a 81 y.o. Caucasian female whose past medical history and cardiac risk factors include: Hypertension, chronic hypoxia, left breast cancer status postlumpectomy/radiation/tamoxifen, anxiety, depression, GERD, history of dysphagia requiring esophageal dilatation, aortic atherosclerosis, ectasia of the ascending thoracic aorta 39 mm (CT scan 07/08/2021).  Impression:  Hypoxia - multifactorial  Platypnea  Abnormal echo -  positive bubble study concerning for interatrial shunting.  HTN Diastolic Dysfunction.  Dysphagia w/ hx of esophageal dilatation.  Aortic atherosclerosis  Ectasia of ascending aorta 17m. Hx of breast cancer, left sided, s/p lumpectomy/radiation/tamoxifen   Plan:  Cardiac MRI results pending.  Orthostatic vital signs positive -we will hydrate and 0.9 normal saline 75 cc/ hour for 24 hours.  GI evaluation pending.  They did stop by yesterday to consult the patient was not in her room.  They recommended esophagram and based on the results we will provide recommendations and plans for transesophageal echocardiogram.  Keep n.p.o.  We will proceed with transesophageal echocardiogram once cleared by GI.  After careful review of history and examination, the risks, benefits of transesophageal echocardiogram, and alternatives have been explained to the patient and her daughter. Complications include but not limited to  esophageal perforation (rare), gastrointestinal bleeding (rare), cardiac arrhythmia which can include cardiac arrest and death (rare), pharyngeal irritation / discomfort with swallowing / hematoma, methemoglobinemia, bronchospasm, transient hypoxia, nonsustained ventricular tachycardia, transient atrial fibrillation, minimal hemoptysis, vomiting, hypotension, respiratory compromise, reaction to medications, unavoidable damage to teeth and gums, aspiration pneumonia  were reviewed with the patient.  Patient voices understands, provides verbal feedback, questions answered, and patient and daughter wishes to proceed with the procedure.   This note was created using a voice recognition software as a result there may be grammatical errors inadvertently enclosed that do not reflect the nature of this encounter. Every attempt is made to correct such errors.  Total time spent: 25 minutes.   Mechele Claude [facility]  Pager: [phone] Office: [phone] 07/11/2021, 8:52 AM    Addendum:  Given history of dysphagia patient underwent barium esophagram as recommended by GI.  She is noted to have stricture within the cervical esophagus.  Given this finding we will discontinue the tentatively planned transesophageal echocardiogram on 07/12/2021.  Given her hypoxia requiring oxygenation, positive TTE bubble study concerning for intracardiac shunt, and symptoms of Platypnea.  Discussed with interventional cardiology with regards to undergoing right heart catheterization & intracardiac echo to further evaluate and treat possible PFO/ASD given the contraindications for transesophageal echocardiogram. Dr. Einar Gip agrees with proceeding with RHC and intracardiac echo to further evaluate for intracardiac shunting and if needed will proceed w/ closure.  Findings and recommendations conveyed to the patient and her daughter [name].  They verbalized understanding and are willing to proceed with right heart catheterization,  intracardiac echo, and fixing of intracardiac shunt is present.  Also spoke to Dr.Outlaw from GI service and he is aware of the plan as well.   Mechele Claude [facility]  Pager: [phone] Office: [phone]  07/11/21 2:05 PM

## 2021-07-11 NOTE — Progress Notes (Addendum)
Physical Therapy Treatment Patient Details Name: Monique Ballard MRN: [id number] DOB: 1940-03-08 Today's Date: 07/11/2021   History of Present Illness Pt is a 81 y/o female admitted secondary to fall and hypoxia. Pt found to have R distal fibular fx and was splinted. Pt also with some hallucinations during hospitalization, but that has since improved. PMH includes breast cancer, alcohol dependency, depression/anxiety.    PT Comments    Pt received in supine, agreeable to therapy session with excellent participation and fair tolerance for transfer training. Pt hypoxic on RA, needing 3L O2 [place] to maintain SpO2 92% and greater with exertion, and demonstrates orthostatic hypotension after lateral scoot transfer to wheelchair.  Orthostatic BPs Supine 96/73 (82); HR 70's bpm  Sitting 60/49 (54); HR 80's bpm  Pt needing up to minA +2 for lateral scoot transfer using slide board and up to modA +2 for squat pivot transfer with +2 HHA/bed pad. Defer wheelchair mobility this session due to pt orthostatic symptoms and significant fatigue, pt reports >8/10 modified RPE post-exertion.  Pt continues to benefit from PT services to progress toward functional mobility goals, continue to recommend HHPT once medically stable as pt/family have good support and remain agreeable to HHPT.   Recommendations for follow up therapy are one component of a multi-disciplinary discharge planning process, led by the attending physician.  Recommendations may be updated based on patient status, additional functional criteria and insurance authorization.  Follow Up Recommendations  Home health PT (family would like pt to return home)     Assistance Recommended at Discharge Frequent or constant Supervision/Assistance  Patient can return home with the following A lot of help with walking and/or transfers;A lot of help with bathing/dressing/bathroom;Assistance with cooking/housework;Help with stairs or ramp for entrance;Assist for  transportation;Direct supervision/assist for financial management;Direct supervision/assist for medications management   Equipment Recommendations  Wheelchair (measurements PT);Wheelchair cushion (measurements PT);BSC/3in1;Other (comment) (drop arm BSC, slide board)    Recommendations for Other Services       Precautions / Restrictions Precautions Precautions: Fall Precaution Comments: orthostatic hypotension, increased O2 needs w/exertion Restrictions Weight Bearing Restrictions: Yes RLE Weight Bearing: Non weight bearing     Mobility  Bed Mobility Overal bed mobility: Needs Assistance Bed Mobility: Supine to Sit, Sit to Supine     Supine to sit: Min guard Sit to supine: Min assist   General bed mobility comments: cues to use rail, no physical assist to sit up but increased assist to return to supine 2/2 orthostatic symptoms    Transfers Overall transfer level: Needs assistance Equipment used: Rolling walker (2 wheels) Transfers: Bed to chair/wheelchair/BSC       Squat pivot transfers: Mod assist, +2 physical assistance    Lateral/Scoot Transfers: Min assist, +2 safety/equipment, With slide board General transfer comment: cues for hand placement, body mechanics for board placement, WC locking, NWB on RLE; pt with pale pallor and orthostatic hypotension after sitting a few mins so given increased assist and squat pivot to return to bed with transfer pad and +2 assist.       Balance Overall balance assessment: Needs assistance Sitting-balance support: No upper extremity supported Sitting balance-Leahy Scale: Good Sitting balance - Comments: Good static seated balance, intermittent UE support needed   Standing balance support: Bilateral upper extremity supported Standing balance-Leahy Scale: Poor Standing balance comment: Reliant on UE and external support +2 while squat pivoting 2/2 RLE NWB precs  Cognition Arousal/Alertness:  Awake/alert Behavior During Therapy: WFL for tasks assessed/performed Overall Cognitive Status: Impaired/Different from baseline Area of Impairment: Problem solving, Following commands                       Following Commands: Follows one step commands with increased time     Problem Solving: Slow processing, Decreased initiation, Difficulty sequencing, Requires verbal cues General Comments: Good initiation and participation, pt can be mildly impulsive, pt daughter present (who works as a PT in OP setting) and was helpful with mobilizing pt as needed.        Exercises Other Exercises Other Exercises: supine BLE review with A/AAROM: ankle pumps, quad sets, hip abduction, SLR, SAQ, hip flexion x1-2 reps ea for teachback Other Exercises: pursed-lip breathing x10 reps while resting (x5 reps multiple bouts) Other Exercises: seated leans with elbow taps, scooting to L/R sides for BUE strengthening    General Comments General comments (skin integrity, edema, etc.): after scooting to WC on slide board, pt with pale pallor, BP taken reading 60/49 (54) and HR 80's bpm; BP 96/73 (82) after return to supine, HR 70's bpm, color improving. Hypoxic on RA, SpO2 desat to 83% on 1L with exertion, improves to 92% and above on 3L [place] with exertion.      Pertinent Vitals/Pain Pain Assessment Pain Assessment: Faces Faces Pain Scale: Hurts a little bit Pain Location: R ankle Pain Descriptors / Indicators: Discomfort Pain Intervention(s): Monitored during session, Repositioned           PT Goals (current goals can now be found in the care plan section) Acute Rehab PT Goals Patient Stated Goal: to go home PT Goal Formulation: With patient/family Time For Goal Achievement: 07/24/21 Progress towards PT goals: Progressing toward goals    Frequency    Min 5X/week      PT Plan Current plan remains appropriate       AM-PAC PT "6 Clicks" Mobility   Outcome Measure  Help needed  turning from your back to your side while in a flat bed without using bedrails?: A Little Help needed moving from lying on your back to sitting on the side of a flat bed without using bedrails?: A Little Help needed moving to and from a bed to a chair (including a wheelchair)?: A Lot Help needed standing up from a chair using your arms (e.g., wheelchair or bedside chair)?: A Lot Help needed to walk in hospital room?: Total Help needed climbing 3-5 steps with a railing? : Total 6 Click Score: 12    End of Session Equipment Utilized During Treatment: Oxygen Activity Tolerance: Treatment limited secondary to medical complications (Comment);Patient limited by fatigue (orthostatic hypotension limiting mobility progression) Patient left: in bed;with bed alarm set;with call bell/phone within reach;Other (comment);with family/visitor present (pt heels floated) Nurse Communication: Mobility status;Other (comment) (orthostatic hypotension, hypoxia on RA and 1L with exertion) PT Visit Diagnosis: Unsteadiness on feet (R26.81);Muscle weakness (generalized) (M62.81);Difficulty in walking, not elsewhere classified (R26.2)     Time: 3785-8850 PT Time Calculation (min) (ACUTE ONLY): 31 min  Charges:  $Therapeutic Exercise: 8-22 mins $Therapeutic Activity: 8-22 mins                     Amario Longmore P., PTA Acute Rehabilitation Services Secure Chat Preferred 9a-5:30pm Office: [place] 07/11/2021, 5:39 PM

## 2021-07-11 NOTE — Care Management Important Message (Signed)
Important Message  Patient Details  Name: Teigen Parslow MRN: [id number] Date of Birth: 1940-02-26   Medicare Important Message Given:  Yes     Orbie Pyo 07/11/2021, 1:57 PM

## 2021-07-11 NOTE — Progress Notes (Signed)
SATURATION QUALIFICATIONS: (This note is used to comply with regulatory documentation for home oxygen)  Patient Saturations on Room Air at Rest = 82%  Patient Saturations on Room Air while Ambulating = N/A  Patient Saturations on 3 Liters of oxygen while performing mobility tasks = 92% and greater  Please briefly explain why patient needs home oxygen: Pt hypoxic on RA, she desat on 1 and 2L/min during functional mobility tasks and needed 3L O2 [place] to maintain SpO2 92% and greater with seated/scooting tasks.

## 2021-07-12 ENCOUNTER — Inpatient Hospital Stay (HOSPITAL_COMMUNITY)
Admission: EM | Disposition: A | Discharge status: Rehabilitation Facility | Payer: Self-pay | Source: Home / Self Care | Attending: Family Medicine

## 2021-07-12 ENCOUNTER — Encounter (HOSPITAL_COMMUNITY): Payer: Self-pay | Admitting: Cardiology

## 2021-07-12 ENCOUNTER — Encounter (HOSPITAL_COMMUNITY)
Admission: EM | Disposition: A | Discharge status: Rehabilitation Facility | Payer: Self-pay | Source: Home / Self Care | Attending: Family Medicine

## 2021-07-12 DIAGNOSIS — R0609 Other forms of dyspnea: Secondary | ICD-10-CM

## 2021-07-12 DIAGNOSIS — R0902 Hypoxemia: Secondary | ICD-10-CM | POA: Diagnosis not present

## 2021-07-12 DIAGNOSIS — D45 Polycythemia vera: Secondary | ICD-10-CM

## 2021-07-12 HISTORY — PX: PATENT FORAMEN OVALE(PFO) CLOSURE: CATH118300

## 2021-07-12 HISTORY — PX: RIGHT HEART CATH: CATH118263

## 2021-07-12 LAB — BASIC METABOLIC PANEL
Anion gap: 5 (ref 5–15)
BUN: 9 mg/dL (ref 8–23)
CO2: 20 mmol/L — ABNORMAL LOW (ref 22–32)
Calcium: 8.1 mg/dL — ABNORMAL LOW (ref 8.9–10.3)
Chloride: 106 mmol/L (ref 98–111)
Creatinine, Ser: 0.8 mg/dL (ref 0.44–1.00)
GFR, Estimated: >60 mL/min (ref >60)
Glucose, Bld: 113 mg/dL — ABNORMAL HIGH (ref 70–99)
Potassium: 3.2 mmol/L — ABNORMAL LOW (ref 3.5–5.1)
Sodium: 131 mmol/L — ABNORMAL LOW (ref 135–145)

## 2021-07-12 LAB — POCT I-STAT EG7
Acid-base deficit: 4 mmol/L — ABNORMAL HIGH (ref 0.0–2.0)
Acid-base deficit: 4 mmol/L — ABNORMAL HIGH (ref 0.0–2.0)
Acid-base deficit: 5 mmol/L — ABNORMAL HIGH (ref 0.0–2.0)
Bicarbonate: 20.7 mmol/L (ref 20.0–28.0)
Bicarbonate: 21.2 mmol/L (ref 20.0–28.0)
Bicarbonate: 21.8 mmol/L (ref 20.0–28.0)
Calcium, Ion: 1.25 mmol/L (ref 1.15–1.40)
Calcium, Ion: 1.26 mmol/L (ref 1.15–1.40)
Calcium, Ion: 1.27 mmol/L (ref 1.15–1.40)
HCT: 41 % (ref 36.0–46.0)
HCT: 41 % (ref 36.0–46.0)
HCT: 41 % (ref 36.0–46.0)
Hemoglobin: 13.9 g/dL (ref 12.0–15.0)
Hemoglobin: 13.9 g/dL (ref 12.0–15.0)
Hemoglobin: 13.9 g/dL (ref 12.0–15.0)
O2 Saturation: 60 %
O2 Saturation: 67 %
O2 Saturation: 69 %
Potassium: 3.9 mmol/L (ref 3.5–5.1)
Potassium: 4 mmol/L (ref 3.5–5.1)
Potassium: 4.1 mmol/L (ref 3.5–5.1)
Sodium: 133 mmol/L — ABNORMAL LOW (ref 135–145)
Sodium: 134 mmol/L — ABNORMAL LOW (ref 135–145)
Sodium: 134 mmol/L — ABNORMAL LOW (ref 135–145)
TCO2: 22 mmol/L (ref 22–32)
TCO2: 22 mmol/L (ref 22–32)
TCO2: 23 mmol/L (ref 22–32)
pCO2, Ven: 38 mmHg — ABNORMAL LOW (ref 44–60)
pCO2, Ven: 38.7 mmHg — ABNORMAL LOW (ref 44–60)
pCO2, Ven: 40.4 mmHg — ABNORMAL LOW (ref 44–60)
pH, Ven: 7.336 (ref 7.25–7.43)
pH, Ven: 7.341 (ref 7.25–7.43)
pH, Ven: 7.356 (ref 7.25–7.43)
pO2, Ven: 33 mmHg (ref 32–45)
pO2, Ven: 37 mmHg (ref 32–45)
pO2, Ven: 37 mmHg (ref 32–45)

## 2021-07-12 LAB — CBC
HCT: 39.6 % (ref 36.0–46.0)
Hemoglobin: 12.9 g/dL (ref 12.0–15.0)
MCH: 30.6 pg (ref 26.0–34.0)
MCHC: 32.6 g/dL (ref 30.0–36.0)
MCV: 93.8 fL (ref 80.0–100.0)
Platelets: 248 10*3/uL (ref 150–400)
RBC: 4.22 MIL/uL (ref 3.87–5.11)
RDW: 13.3 % (ref 11.5–15.5)
WBC: 9.3 10*3/uL (ref 4.0–10.5)
nRBC: 0 % (ref 0.0–0.2)

## 2021-07-12 LAB — URINALYSIS, ROUTINE W REFLEX MICROSCOPIC
Bilirubin Urine: NEGATIVE
Glucose, UA: NEGATIVE mg/dL
Ketones, ur: 5 mg/dL — AB
Nitrite: NEGATIVE
Protein, ur: 30 mg/dL — AB
RBC / HPF: >50 RBC/hpf — ABNORMAL HIGH (ref 0–5)
Specific Gravity, Urine: 1.01 (ref 1.005–1.030)
WBC, UA: >50 WBC/hpf — ABNORMAL HIGH (ref 0–5)
pH: 6 (ref 5.0–8.0)

## 2021-07-12 LAB — POCT I-STAT 7, (LYTES, BLD GAS, ICA,H+H)
Acid-base deficit: 5 mmol/L — ABNORMAL HIGH (ref 0.0–2.0)
Acid-base deficit: 7 mmol/L — ABNORMAL HIGH (ref 0.0–2.0)
Bicarbonate: 18.5 mmol/L — ABNORMAL LOW (ref 20.0–28.0)
Bicarbonate: 20.2 mmol/L (ref 20.0–28.0)
Calcium, Ion: 1.11 mmol/L — ABNORMAL LOW (ref 1.15–1.40)
Calcium, Ion: 1.2 mmol/L (ref 1.15–1.40)
HCT: 37 % (ref 36.0–46.0)
HCT: 38 % (ref 36.0–46.0)
Hemoglobin: 12.6 g/dL (ref 12.0–15.0)
Hemoglobin: 12.9 g/dL (ref 12.0–15.0)
O2 Saturation: 79 %
O2 Saturation: 94 %
Potassium: 3.5 mmol/L (ref 3.5–5.1)
Potassium: 3.7 mmol/L (ref 3.5–5.1)
Sodium: 135 mmol/L (ref 135–145)
Sodium: 137 mmol/L (ref 135–145)
TCO2: 20 mmol/L — ABNORMAL LOW (ref 22–32)
TCO2: 21 mmol/L — ABNORMAL LOW (ref 22–32)
pCO2 arterial: 34.9 mmHg (ref 32–48)
pCO2 arterial: 39.1 mmHg (ref 32–48)
pH, Arterial: 7.321 — ABNORMAL LOW (ref 7.35–7.45)
pH, Arterial: 7.334 — ABNORMAL LOW (ref 7.35–7.45)
pO2, Arterial: 47 mmHg — ABNORMAL LOW (ref 83–108)
pO2, Arterial: 73 mmHg — ABNORMAL LOW (ref 83–108)

## 2021-07-12 LAB — POCT ACTIVATED CLOTTING TIME: Activated Clotting Time: 347 seconds

## 2021-07-12 SURGERY — RIGHT HEART CATH
Anesthesia: LOCAL

## 2021-07-12 SURGERY — ECHOCARDIOGRAM, TRANSESOPHAGEAL
Anesthesia: Monitor Anesthesia Care

## 2021-07-12 MED ORDER — POTASSIUM CHLORIDE 10 MEQ/100ML IV SOLN
10.0000 meq | INTRAVENOUS | Status: AC
  Administered 2021-07-12 (×3): 10 meq via INTRAVENOUS
  Filled 2021-07-12 (×3): qty 100

## 2021-07-12 MED ORDER — CLOPIDOGREL BISULFATE 75 MG PO TABS
75.0000 mg | ORAL_TABLET | Freq: Every day | ORAL | Status: DC
  Administered 2021-07-13 – 2021-07-15 (×3): 75 mg via ORAL
  Filled 2021-07-12 (×3): qty 1

## 2021-07-12 MED ORDER — HEPARIN (PORCINE) IN NACL 1000-0.9 UT/500ML-% IV SOLN
INTRAVENOUS | Status: AC
  Filled 2021-07-12: qty 500

## 2021-07-12 MED ORDER — HEPARIN (PORCINE) IN NACL 1000-0.9 UT/500ML-% IV SOLN
INTRAVENOUS | Status: AC
  Filled 2021-07-12: qty 1000

## 2021-07-12 MED ORDER — HEPARIN SODIUM (PORCINE) 1000 UNIT/ML IJ SOLN
INTRAMUSCULAR | Status: DC | PRN
  Administered 2021-07-12: 8000 [IU] via INTRAVENOUS

## 2021-07-12 MED ORDER — VANCOMYCIN HCL IN DEXTROSE 1-5 GM/200ML-% IV SOLN
1000.0000 mg | INTRAVENOUS | Status: AC
  Administered 2021-07-12: 1000 mg via INTRAVENOUS

## 2021-07-12 MED ORDER — SODIUM CHLORIDE 0.9 % WEIGHT BASED INFUSION
1.0000 mL/kg/h | INTRAVENOUS | Status: DC
  Administered 2021-07-12: 1 mL/kg/h via INTRAVENOUS

## 2021-07-12 MED ORDER — HEPARIN SODIUM (PORCINE) 1000 UNIT/ML IJ SOLN
INTRAMUSCULAR | Status: AC
  Filled 2021-07-12: qty 10

## 2021-07-12 MED ORDER — MIDAZOLAM HCL 2 MG/2ML IJ SOLN
INTRAMUSCULAR | Status: AC
  Filled 2021-07-12: qty 2

## 2021-07-12 MED ORDER — LIDOCAINE HCL (PF) 1 % IJ SOLN
INTRAMUSCULAR | Status: DC | PRN
  Administered 2021-07-12: 2 mL via INTRADERMAL

## 2021-07-12 MED ORDER — SODIUM CHLORIDE 0.9 % IV SOLN
250.0000 mL | INTRAVENOUS | Status: DC | PRN
  Administered 2021-07-14: 250 mL via INTRAVENOUS

## 2021-07-12 MED ORDER — SODIUM CHLORIDE 0.9 % IV BOLUS
INTRAVENOUS | Status: AC | PRN
  Administered 2021-07-12: 500 mL via INTRAVENOUS

## 2021-07-12 MED ORDER — THIAMINE HCL 100 MG/ML IJ SOLN
500.0000 mg | Freq: Three times a day (TID) | INTRAVENOUS | Status: AC
  Administered 2021-07-12 – 2021-07-14 (×7): 500 mg via INTRAVENOUS
  Filled 2021-07-12 (×8): qty 5

## 2021-07-12 MED ORDER — FENTANYL CITRATE (PF) 100 MCG/2ML IJ SOLN
INTRAMUSCULAR | Status: DC | PRN
  Administered 2021-07-12: 50 ug via INTRAVENOUS

## 2021-07-12 MED ORDER — SODIUM CHLORIDE 0.9% FLUSH
3.0000 mL | Freq: Two times a day (BID) | INTRAVENOUS | Status: DC
  Administered 2021-07-12 – 2021-07-15 (×6): 3 mL via INTRAVENOUS

## 2021-07-12 MED ORDER — SODIUM CHLORIDE 0.9 % WEIGHT BASED INFUSION
3.0000 mL/kg/h | INTRAVENOUS | Status: DC
  Administered 2021-07-12: 3 mL/kg/h via INTRAVENOUS

## 2021-07-12 MED ORDER — VANCOMYCIN HCL IN DEXTROSE 1-5 GM/200ML-% IV SOLN
INTRAVENOUS | Status: AC
  Filled 2021-07-12: qty 200

## 2021-07-12 MED ORDER — SODIUM CHLORIDE 0.9% FLUSH: 3.0000 mL | INTRAVENOUS | Status: DC | PRN

## 2021-07-12 MED ORDER — MIDAZOLAM HCL 2 MG/2ML IJ SOLN
INTRAMUSCULAR | Status: DC | PRN
  Administered 2021-07-12: 2 mg via INTRAVENOUS

## 2021-07-12 MED ORDER — CLOPIDOGREL BISULFATE 300 MG PO TABS
ORAL_TABLET | ORAL | Status: AC
  Filled 2021-07-12: qty 1

## 2021-07-12 MED ORDER — SODIUM CHLORIDE 0.9% FLUSH
3.0000 mL | Freq: Two times a day (BID) | INTRAVENOUS | Status: DC
  Administered 2021-07-14 – 2021-07-15 (×3): 3 mL via INTRAVENOUS

## 2021-07-12 MED ORDER — THIAMINE HCL 100 MG PO TABS
100.0000 mg | ORAL_TABLET | Freq: Every day | ORAL | Status: DC
  Administered 2021-07-15: 100 mg via ORAL
  Filled 2021-07-12: qty 1

## 2021-07-12 MED ORDER — LIDOCAINE HCL (PF) 1 % IJ SOLN
INTRAMUSCULAR | Status: AC
  Filled 2021-07-12: qty 30

## 2021-07-12 MED ORDER — POTASSIUM CHLORIDE CRYS ER 20 MEQ PO TBCR
40.0000 meq | EXTENDED_RELEASE_TABLET | Freq: Once | ORAL | Status: AC
Start: 2021-07-12 — End: 2021-07-12
  Administered 2021-07-12: 40 meq via ORAL
  Filled 2021-07-12: qty 2

## 2021-07-12 MED ORDER — CLOPIDOGREL BISULFATE 300 MG PO TABS
ORAL_TABLET | ORAL | Status: DC | PRN
  Administered 2021-07-12: 600 mg via ORAL

## 2021-07-12 MED ORDER — ONDANSETRON HCL 4 MG/2ML IJ SOLN: 4.0000 mg | Freq: Four times a day (QID) | INTRAMUSCULAR | Status: DC | PRN

## 2021-07-12 MED ORDER — FENTANYL CITRATE (PF) 100 MCG/2ML IJ SOLN
INTRAMUSCULAR | Status: AC
  Filled 2021-07-12: qty 2

## 2021-07-12 SURGICAL SUPPLY — 20 items
CATH 8FR ACUNAV REPROCESSED (CATHETERS) IMPLANT
CATH EXPO 5F MPA-1 (CATHETERS) ×1 IMPLANT
CATH REPROCESSED 8FR ACUNAV (CATHETERS) ×2 IMPLANT
CATH SWAN GANZ 7F STRAIGHT (CATHETERS) ×1 IMPLANT
CLOSURE PERCLOSE PROSTYLE (VASCULAR PRODUCTS) ×2 IMPLANT
COVER SWIFTLINK CONNECTOR (BAG) ×1 IMPLANT
GUIDEWIRE AMPLATZER 1.5JX260 (WIRE) ×1 IMPLANT
KIT HEART LEFT (KITS) ×2 IMPLANT
OCCLUDER AMPLATZER PFO 25MM (Prosthesis & Implant Heart) ×1 IMPLANT
PACK CARDIAC CATHETERIZATION (CUSTOM PROCEDURE TRAY) ×2 IMPLANT
SHEATH DELIVERY TALISMAN 8F 80 (SHEATH) IMPLANT
SHEATH INTROD W/O MIN 9FR 25CM (SHEATH) ×1 IMPLANT
SHEATH PINNACLE 8F 10CM (SHEATH) ×1 IMPLANT
TALISMAN DELIVERY SHEATH 8F 80 (SHEATH) ×2
TRANSDUCER W/STOPCOCK (MISCELLANEOUS) ×3 IMPLANT
TUBING ART PRESS 72  MALE/FEM (TUBING) ×2
TUBING ART PRESS 72 MALE/FEM (TUBING) IMPLANT
TUBING CIL FLEX 10 FLL-RA (TUBING) ×2 IMPLANT
WIRE EMERALD 3MM-J .035X150CM (WIRE) ×1 IMPLANT
WIRE EMERALD 3MM-J .035X260CM (WIRE) ×1 IMPLANT

## 2021-07-12 NOTE — Progress Notes (Signed)
Pt attempting to have BM with bedpan. C/O stomach pain. Denies nausea. Rt and Left lower quads of abd feel firm with palpation, but pt denies tenderness. Bladder scan done = 721. In and Out cath attempted x 2 with RRT nurse at bedside but unsuccessful. Also, RRT notified of possible code stroke as pt noted to have word finding problems and unable to verbalize what she is trying to say. Keeps repeating "6:30, 6:30", but unable to find other words. No other focal deficits noted on assessment with Wilburn Cornelia, Therapist, sports. Dr Einar Gip notified of word finding problems and firmness of abdomen. Can try dulcolax or colace per Dr Einar Gip, but pt's daughter denies pt being constipated and has received laxatives over the last couple of days with several BM's.

## 2021-07-12 NOTE — Progress Notes (Signed)
PROGRESS NOTE    Monique Ballard  DQQ:[id number] DOB: December 19, 1940 DOA: 07/08/2021 PCP: Colon Branch, MD    Brief Narrative:  This 81 years old female with PMH significant for chronic hypoxic respiratory failure, noncompliant with home oxygen, alcohol dependence, breast cancer status postlumpectomy and chemotherapy, GERD, anxiety/depression presented with exertional shortness of breath for 2 to 3 days.  At baseline she has chronic exertional dyspnea but has worsened in the last few days.  Patient was using oxygen on and off for last 2 years.  She has not followed up with any pulmonologist.  Patient also sustained a mechanical fall and hit her right ankle and started to feel pain. Patient was admitted for acute on chronic hypoxic respiratory failure requiring BiPAP and nondisplaced right distal fibular fracture. She is successfully weaned down to high flow nasal cannula at 3 L.  Chest x-ray without any infiltrates.  CTA negative for PE.  It was discussed with [facility] Dr. Lyla Glassing who recommended to splinter and nonweightbearing follow-up Dr. Kathaleen Bury in 7 days.  Echocardiogram shows intra-atrial shunt.  Cardiology is consulted for TEE.  Patient does have a history of esophageal strictures in the past,  required esophageal dilatation.  GI is consulted for evaluation before TEE.  Esophagogram shows strictures within the cervical esophagus, it can be addressed outpatient.  As per cardiology patient will proceed with evaluation of shunt but it does not require TEE given narrow cervical esophageal stricture. Patient is now scheduled for RHC and intracardiac echo to further evaluate and treat possible PFO/ASD given contraindication for TEE.  Assessment & Plan:   Principal Problem:   Hypoxia Active Problems:   Alcohol dependency ([place])   Acute respiratory failure ([facility])  Acute on chronic hypoxic respiratory failure: She was severely hypoxic requiring BiPAP on arrival.  Now weaned down to [place]  2l/min. Etiology unknown.  Patient was evaluated by PCCM, initial impression was hepatopulmonary syndrome versus other shunt conditions.   Recommended echocardiogram and RUQ ultrasound. RUQ ultrasound: Single 6 mm gallstone without acute cholecystitis.  Echo shows LVEF 55 to 60%, no RWMA.  Intra-atrial shunt noted. Patient also found to have polycythemia which could be causing chronic hypoxia. CTA chest ruled out pulmonary embolism.  Other differential diagnosis pulmonary hypertension. She is successfully weaned down to 3 L high flow nasal cannula sats 94%.  Wean as tolerated.  Intra atrial shunt : Cardiology is consulted, she was tentatively scheduled for TEE but cancelled. GI was consulted given history of esophageal strictures in the past, required dilatation.  Esophagogram shows strictures within the cervical esophagus, it can be addressed outpatient Patient is now scheduled for RHC and intracardiac echo to further evaluate and treat possible PFO/ASD given contraindication for TEE.  Right ankle fracture: X-ray showed nondisplaced right distal fibular fracture. Discussed with [facility] Dr. Lyla Glassing, recommended splint for now and nonweightbearing. Follow-up Dr. Kathaleen Bury in 7 days.  Hypokalemia: Replaced.  Continue to monitor  Polycythemia: Likely causing chronic hypoxia.  Elevated lactic acid: > Resolved. Patient appears euvolemic on exam.  Assessed by PCCM, concerned about underlying liver disease.   Avoid aggressive hydration at this point. Chest x-ray no infiltrate, no urinary symptoms, monitor off antibiotics. Lactic acid normalized with IV hydration.  Sinus tachycardia: Patient appears euvolemic,  no further hydration. Continue low-dose metoprolol.  Elevated troponin: Could be in the setting of hypoxia, denies any chest pain. Troponin trending down 30> 20.  EKG without ischemic changes 2D echocardiogram shows LVEF 55 to 60%.  No RWMA.  EtOH  abuse: No signs and  symptoms of active withdrawal.   Continue CIWA protocol.  DVT prophylaxis: Lovenox. Code Status: Full code. Family Communication: No family at bed side. Disposition Plan:    Status is: Inpatient Remains inpatient appropriate because:  Admitted for acute on chronic hypoxic respiratory failure requiring BiPAP now weaned down to HFNC 3 L.  Patient continued on CIWA protocol for alcohol withdrawal.  Echo shows intra-atrial shunt, scheduled for RHC and intracardiac echo tomorrow to further evaluate and treat possible PFO/ASD given contraindication for TEE.    Consultants:  Cardiology GI Pulmonology  Procedures:  Echo, RUQ ultrasound Antimicrobials:   Anti-infectives (From admission, onward)    Start     Dose/Rate Route Frequency Ordered Stop   07/12/21 1045  vancomycin (VANCOCIN) IVPB 1000 mg/200 mL premix        1,000 mg 200 mL/hr over 60 Minutes Intravenous On call 07/12/21 0958 07/13/21 1045        Subjective: Patient was seen and examined at bedside.  Overnight events noted. Patient was lying comfortably on the bed,  watching television, very interactive, fully conversant. Denies any chest pain shortness of breath or dizziness.   Objective: Vitals:   07/12/21 0400 07/12/21 0754 07/12/21 1136 07/12/21 1200  BP: 98/71  (!) 88/62 124/69  Pulse: (!) 59  66 69  Resp: 15     Temp: 98.4 F (36.9 C) 98 F (36.7 C) 97.9 F (36.6 C) 97.9 F (36.6 C)  TempSrc: Axillary Oral Oral Oral  SpO2: 98%  97% 100%  Weight:      Height:       No intake or output data in the 24 hours ending 07/12/21 1347  Filed Weights   07/08/21 1843  Weight: 59.1 kg    Examination:  General exam: Appears comfortable, not in any acute distress.  Deconditioned Respiratory system: CTA bilaterally, no accessory muscle use, normal respiratory effort.   Cardiovascular system: S1-S2 heard, regular rate and rhythm, no murmur. Gastrointestinal system: Abdomen is soft, non tender, non distended,  BS+ Central nervous system: Alert, oriented x 3, no focal neurological deficits. Extremities: No edema, no cyanosis, no clubbing. Skin: No rashes, lesions or ulcers Psychiatry: Mood and judgment normal.    Data Reviewed: I have personally reviewed following labs and imaging studies  CBC: Recent Labs  Lab 07/08/21 0932 07/08/21 0949 07/08/21 1000 07/10/21 0257 07/12/21 0322  WBC 10.7*  --   --  13.0* 9.3  NEUTROABS 6.6  --   --   --   --   HGB 16.9* 16.3* 18.0* 14.0 12.9  HCT 48.3* 48.0* 53.0* 42.4 39.6  MCV 91.1  --   --  95.1 93.8  PLT 278  --   --  235 818   Basic Metabolic Panel: Recent Labs  Lab 07/08/21 0932 07/08/21 0949 07/08/21 1000 07/08/21 1132 07/10/21 0257 07/12/21 0322  NA 134* 131* 133*  --  136 131*  K 3.3* 3.3* 3.2*  --  3.5 3.2*  CL 98  --  99  --  108 106  CO2 20*  --   --   --  21* 20*  GLUCOSE 137*  --  133*  --  85 113*  BUN 16  --  17  --  15 9  CREATININE 1.03*  --  0.70  --  0.75 0.80  CALCIUM 9.8  --   --   --  9.2 8.1*  MG  --   --   --  1.9 1.9  --   PHOS  --   --   --   --  2.9  --    GFR: Estimated Creatinine Clearance: 47.9 mL/min (by C-G formula based on SCr of 0.8 mg/dL). Liver Function Tests: Recent Labs  Lab 07/08/21 0932 07/10/21 0257  AST 25 23  ALT 22 21  ALKPHOS 70 49  BILITOT 2.1* 0.7  PROT 7.3 5.8*  ALBUMIN 3.6 2.9*   No results for input(s): "LIPASE", "AMYLASE" in the last 168 hours. No results for input(s): "AMMONIA" in the last 168 hours. Coagulation Profile: Recent Labs  Lab 07/08/21 0932  INR 1.1   Cardiac Enzymes: No results for input(s): "CKTOTAL", "CKMB", "CKMBINDEX", "TROPONINI" in the last 168 hours. BNP (last 3 results) No results for input(s): "PROBNP" in the last 8760 hours. HbA1C: No results for input(s): "HGBA1C" in the last 72 hours. CBG: No results for input(s): "GLUCAP" in the last 168 hours. Lipid Profile: No results for input(s): "CHOL", "HDL", "LDLCALC", "TRIG", "CHOLHDL",  "LDLDIRECT" in the last 72 hours. Thyroid Function Tests: No results for input(s): "TSH", "T4TOTAL", "FREET4", "T3FREE", "THYROIDAB" in the last 72 hours.  Anemia Panel: No results for input(s): "VITAMINB12", "FOLATE", "FERRITIN", "TIBC", "IRON", "RETICCTPCT" in the last 72 hours. Sepsis Labs: Recent Labs  Lab 07/08/21 0932 07/08/21 1132 07/08/21 1450 07/08/21 1804  LATICACIDVEN 2.3* 4.5* 4.2* 1.5    Recent Results (from the past 240 hour(s))  Culture, blood (Routine x 2)     Status: None (Preliminary result)   Collection Time: 07/08/21  9:32 AM   Specimen: BLOOD  Result Value Ref Range Status   Specimen Description BLOOD LEFT ANTECUBITAL  Final   Special Requests   Final    BOTTLES DRAWN AEROBIC AND ANAEROBIC Blood Culture results may not be optimal due to an inadequate volume of blood received in culture bottles   Culture   Final    NO GROWTH 4 DAYS Performed at [facility] [address], [place], [place] [address]    Report Status PENDING  Incomplete  SARS Coronavirus 2 by RT PCR (hospital order, performed in [facility] hospital lab) *cepheid single result test* Anterior Nasal Swab     Status: None   Collection Time: 07/08/21  9:33 AM   Specimen: Anterior Nasal Swab  Result Value Ref Range Status   SARS Coronavirus 2 by RT PCR NEGATIVE NEGATIVE Final    Comment: (NOTE) SARS-CoV-2 target nucleic acids are NOT DETECTED.  The SARS-CoV-2 RNA is generally detectable in upper and lower respiratory specimens during the acute phase of infection. The lowest concentration of SARS-CoV-2 viral copies this assay can detect is 250 copies / mL. A negative result does not preclude SARS-CoV-2 infection and should not be used as the sole basis for treatment or other patient management decisions.  A negative result may occur with improper specimen collection / handling, submission of specimen other than nasopharyngeal swab, presence of viral mutation(s) within the areas  targeted by this assay, and inadequate number of viral copies (<250 copies / mL). A negative result must be combined with clinical observations, patient history, and epidemiological information.  Fact Sheet for Patients:   https://www.patel.info/  Fact Sheet for Healthcare Providers: https://hall.com/  This test is not yet approved or  cleared by the [place] FDA and has been authorized for detection and/or diagnosis of SARS-CoV-2 by FDA under an Emergency Use Authorization (EUA).  This EUA will remain in effect (meaning this test can be used) for  the duration of the COVID-19 declaration under Section 564(b)(1) of the Act, 21 U.S.C. section 360bbb-3(b)(1), unless the authorization is terminated or revoked sooner.  Performed at [facility], [place] [address] [place], [place] [address]   Culture, blood (Routine x 2)     Status: None (Preliminary result)   Collection Time: 07/08/21 10:13 AM   Specimen: BLOOD RIGHT WRIST  Result Value Ref Range Status   Specimen Description BLOOD RIGHT WRIST  Final   Special Requests   Final    BOTTLES DRAWN AEROBIC AND ANAEROBIC Blood Culture results may not be optimal due to an inadequate volume of blood received in culture bottles   Culture   Final    NO GROWTH 4 DAYS Performed at [facility], [place] [address] [place], [place] [address]    Report Status PENDING  Incomplete    Radiology Studies: MR CARDIAC VELOCITY FLOW MAP  Result Date: 07/11/2021 CLINICAL DATA:  Congenital heart disease, known or suspected EXAM: CARDIAC MRI TECHNIQUE: The patient was scanned on a 1.5 Tesla GE magnet. A dedicated cardiac coil was used. Functional imaging was done using [place] sequences. 2,3, and 4 chamber views were done to assess for RWMA's. Modified Simpson's rule using a short axis stack was used to calculate an ejection fraction on a dedicated work Conservation officer, nature. The patient received .  After 10 minutes inversion recovery sequences were used to assess for infiltration and scar tissue. This examination is tailored for evaluation cardiac anatomy and function and provides very limited assessment of noncardiac structures, which are accordingly not evaluated during interpretation. If there is clinical concern for extracardiac pathology, further evaluation with CT imaging should be considered. FINDINGS: LEFT VENTRICLE: Normal left ventricular chamber size. Normal left ventricular wall thickness. Normal left ventricular systolic function. LVEF = 50%, visually appears 55-60%. Quantitation may be impacted by motion artifact. There are no regional wall motion abnormalities. No myocardial edema, T2 = 54 msec, upper limit of scanner normal Normal first pass perfusion. There is a small focus of post contrast delayed myocardial enhancement, inferobasal that is midmyocardial and focal. This is nonspecific and in one segment. Undetermined significance. Normal T1 myocardial nulling kinetics suggest against a diagnosis of cardiac amyloidosis. ECV = 30%, nonspecific elevation RIGHT VENTRICLE: Normal right ventricular chamber size. Normal right ventricular wall thickness. Normal right ventricular systolic function. RVEF = 51% There are no regional wall motion abnormalities. No post contrast delayed myocardial enhancement. ATRIA: Normal left atrial size. Normal right atrial size. VALVES: No significant valvular abnormalities.  Tricuspid aortic valve. PERICARDIUM: Normal pericardium.  Small pericardial effusion. OTHER: No significant extracardiac findings. MEASUREMENTS: Qp/Qs: 0.93 Left ventricle: LV Female LV EF: 50% (Normal 56-78%) Absolute volumes: LV EDV: 50m (Normal 52-141 mL) LV ESV: 441m(Normal 13-51 mL) LV SV: 4030mNormal 33-97 mL) CO: 2.4L/min (Normal 2.7-6.0 L/min) Indexed volumes: LV EDV: 76m61m-m (Normal 41-81 mL/sq-m) LV ESV: 24mL34mm (Normal 12-21 mL/sq-m) LV SV: 25mL/51m (Normal 26-56 mL/sq-m) CI:  1.47L/min/sq-m (Normal 1.8-3.8 L/min/sq-m) Right ventricle: RV female RV EF: 51% (Normal 47-80%) Absolute volumes: RV EDV: 73 mL (Normal 58-154 mL) RV ESV: 36 mL (Normal 12-68 mL) RV SV: 37 mL (Normal 35-98 mL) CO: 2.2 L/min (Normal 2.7-6 L/min) Indexed volumes: RV EDV: 45 ML/sq-m (Normal 48-87 mL/sq-m) RV ESV: 22 mL/sq-m (Normal 11-28 mL/sq-m) RV SV: 23 mL/sq-m (Normal 27-57 mL/sq-m) CI: 1.38 L/min/sq-m (Normal 1.8-3.8 L/min/sq-m) IMPRESSION: 1.  Normal biventricular chamber size and function. 2.  Qp/Qs is 0.93. 3. No ASD  seen. No definite anomalous pulmonary venous return. No findings to suggest congenital heart lesions or intracardiac shunt. Electronically Signed   By: Cherlynn Kaiser M.D.   On: 07/11/2021 13:41   DG ESOPHAGUS W SINGLE CM (SOL OR THIN BA)  Result Date: 07/11/2021 CLINICAL DATA:  Dysphagia. Prior history of Schatzki's ring in 2004. EXAM: ESOPHAGUS/BARIUM SWALLOW/TABLET STUDY TECHNIQUE: Single contrast examination was performed using thin liquid barium. This exam was performed by Brynda Greathouse PA-C, and was supervised and interpreted by Logan Bores, MD. FLUOROSCOPY: Radiation Exposure Index (as provided by the fluoroscopic device): 7.3 mGy COMPARISON:  CT Angio Chest 07/08/2021 FINDINGS: Limited evaluation as patient is nonweightbearing with limited mobility. Swallowing: Grossly unremarkable on this limited study. No aspiration was observed. Pharynx: Unremarkable. Esophagus: Approximately 1 cm long fixed segment of moderate, smooth with narrowing in the cervical esophagus. No evidence of a significant stricture or gross mass more distally. Esophageal motility: Mild-moderate dysmotility evidenced by retropulsion of barium bolus as well as tertiary contractions. Hiatal Hernia: Small sliding hiatal hernia. Gastroesophageal reflux: None visualized during this limited exam. Ingested 13 mm barium tablet: Became stuck at the site of the cervical stricture described above and did not pass despite  ingestion of additional water, thin barium, and applesauce. IMPRESSION: 1. Stricture in the cervical esophagus through which a barium tablet would not pass. 2. Mild-to-moderate esophageal dysmotility. 3. Small sliding hiatal hernia. Electronically Signed   By: Logan Bores M.D.   On: 07/11/2021 12:41     Scheduled Meds:  aspirin EC  81 mg Oral Daily   enoxaparin (LOVENOX) injection  40 mg Subcutaneous Q24H   escitalopram  20 mg Oral Daily   folic acid  1 mg Oral Daily   metoprolol tartrate  25 mg Oral BID   multivitamin with minerals  1 tablet Oral Daily   pantoprazole  40 mg Oral Daily   sodium chloride flush  3 mL Intravenous Q12H   [START ON 07/15/2021] thiamine  100 mg Oral Daily   Continuous Infusions:  sodium chloride 1 mL/kg/hr (07/12/21 1041)   potassium chloride 10 mEq (07/12/21 1329)   thiamine injection 500 mg (07/12/21 1047)   vancomycin       LOS: 4 days    Time spent: 35 mins    Kailia Starry, MD [facility]   If 7PM-7AM, please contact night-coverage

## 2021-07-12 NOTE — Consult Note (Signed)
[facility] Followup Face-to-Face Psychiatric Evaluation   Service Date: July 12, 2021 LOS:  LOS: 4 days    Assessment  Monique Ballard is a 81 y.o. female admitted medically for 07/08/2021  9:14 AM for hypoxia. She carries the psychiatric diagnoses of Depression and Anxiety and has a past medical history of  chronic hypoxic respiratory failure, noncompliant with home oxygen, alcohol dependence, breast cancer status postlumpectomy and chemotherapy, and GERD.Psychiatry was consulted for agitation and hallucinations by Shawna Clamp, MD.      Her initial presentation is most consistent with delirium. She meets criteria for multifactorial delirium based on patient's sudden change in behavior with agitation and having multiple possible etiologies including advanced age, change in environment, possible Etoh withdrawal, and hypoxia.  Current outpatient psychotropic medications include Lexapro '20mg'$  and historically she has had a good response to these medications. She was  compliant with medications prior to admission as evidenced by patient endorsing. On initial examination, patient was sedated due Ativan. Please see plan below for detailed recommendations.   6/22- Patient has not required Haldol PRN the last 48hrs and is no longer hallucinating or having agitation. Patient is sleeping well. Patient mood is stable and patient displays understanding of the implication of her prior Etoh habits.   Diagnoses:  Active Hospital problems: Principal Problem:   Hypoxia Active Problems:   Alcohol dependency ([name])   Acute respiratory failure ([name])     Plan  ## Safety and Observation Level:  - Based on my clinical evaluation, I estimate the patient to be at low risk of self harm in the current setting - At this time, we recommend a routine level of observation. This decision is based on my review of the chart including patient's history and current presentation, interview of the patient,  mental status examination, and consideration of suicide risk including evaluating suicidal ideation, plan, intent, suicidal or self-harm behaviors, risk factors, and protective factors. This judgment is based on our ability to directly address suicide risk, implement suicide prevention strategies and develop a safety plan while the patient is in the clinical setting. Please contact our team if there is a concern that risk level has changed.     ## Medications:  -- Continue home Lexapro '20mg'$  daily -- Discontinue Haldol to '2mg'$  PRN BID -- Continue thiamine to '500mg'$  q8h IV, thiamine deficiency for a total of 5 days before restarting '100mg'$  thiamine PO -- Continue CIWA   ## Medical Decision Making Capacity:  Not formally assessed   ## Further Work-up:  -- Per primary     -- most recent EKG on 6/18 had QtC of 497 w/ Sinus tachy of 110 but JT is 271 which is appropriate -- Pertinent labwork reviewed earlier this admission includes: ABG: pO2 37/ ph 7.483/HCO3 15.6,  CMP- Na 133>136/K 3.2>3.5/Hgb 18>14/, LA 4.5,TSH- WNL   ## Disposition:  -- Per primary   ## Behavioral / Environmental:  -- DELIRIUM RECS 1: Avoid  unnecessary benzodiazepines, antihistamines, anticholinergics, and minimize opiate use as these may worsen delirium. 2:Assess, prevent and manage pain as lack of treatment can result in delirium.  3: Recommend consult to PT/OT if not already done. Early mobility and exercise has been shown to decrease duration of delirium.  4:Provide appropriate lighting and clear signage; a clock and calendar should be easily visible to the patient. 5:Monitor environmental factors. Reduce light and noise at night (close shades, turn off lights, turn off TV, ect). Correct any alterations in sleep cycle. 6:  Reorient the patient to person, place, time and situation on each encounter.  7: Correct sensory deficits if possible (replace eye glasses, hearing aids, ect). 8: Avoid restraints. Severely delirious  patients benefit from constant observation by a sitter. 9: Do not leave patient unattended.      ##Legal Status     Thank you for this consult request. Recommendations have been communicated to the primary team.  We will sign off at this time.     PGY-2 Freida Busman, MD   followup history  Relevant Aspects of Hospital Course:  Admitted on 07/08/2021 for hypoxia.  Patient Report:  Patient lying in bed awake with daughter in room. Patient reports she has been doing fairly well "all things considered" and denies any dysphoric or overly anxious mood. Patient reports she is sleeping well and is awaiting her RHC. Patient reports that she understands that she should not continue drinking Etoh at discharge. Patient denies SI, HI, and AVH.     Psychiatric History:  Information collected from patient and family - Saw Dr. Clovis Pu until 11/2020 when she was felt stable enough for d'c   Family psych history: Daughter is on medication     Social History:        Alcohol use: "2 glasses of wine a day"   Family History:   The patient's family history includes Heart attack in her father; Heart disease in her father; Hypertension in her mother; Stroke in her mother; Throat cancer in an other family member.  Medical History: Past Medical History:  Diagnosis Date   Anxiety    Breast cancer ([place]) fall 1997    Left Lumpectomy, XRT.  treated with Tamoxifem and Evista   Depression    Endometriosis in her 20's   surgery to help clear endometriosis to obtain a pregnancy.   GERD (gastroesophageal reflux disease)    w/"stretching" remotely   HTN (hypertension)    Migraines     Surgical History: Past Surgical History:  Procedure Laterality Date   BREAST LUMPECTOMY Left fall 1997   Lymph nodes X 3 were negative, ER+, Radiation treatmetn.  Took Tamoxifem X 5 yrs then Evista for 3-5 yrs.   CESAREAN SECTION     x2  first pregnancy breach and second normal   surgery for endometriosis       Medications:   Current Facility-Administered Medications:    acetaminophen (TYLENOL) tablet 650 mg, 650 mg, Oral, Q6H PRN, Howerter, Justin B, DO, 650 mg at 07/09/21 2014   aspirin EC tablet 81 mg, 81 mg, Oral, Daily, Wynetta Fines T, MD, 81 mg at 07/11/21 0813   enoxaparin (LOVENOX) injection 40 mg, 40 mg, Subcutaneous, Q24H, Roosevelt Locks, Ping T, MD, 40 mg at 07/11/21 1300   escitalopram (LEXAPRO) tablet 20 mg, 20 mg, Oral, Daily, Wynetta Fines T, MD, 20 mg at 37/85/88 5027   folic acid (FOLVITE) tablet 1 mg, 1 mg, Oral, Daily, Wynetta Fines T, MD, 1 mg at 07/11/21 0813   haloperidol (HALDOL) tablet 2 mg, 2 mg, Oral, BID PRN, Damita Dunnings B, MD   metoprolol tartrate (LOPRESSOR) tablet 25 mg, 25 mg, Oral, BID, Wynetta Fines T, MD, 25 mg at 07/11/21 2210   multivitamin with minerals tablet 1 tablet, 1 tablet, Oral, Daily, Wynetta Fines T, MD, 1 tablet at 07/11/21 0813   pantoprazole (PROTONIX) EC tablet 40 mg, 40 mg, Oral, Daily, Wynetta Fines T, MD, 40 mg at 07/11/21 0813   potassium chloride 10 mEq in 100 mL IVPB, 10 mEq,  Intravenous, Q1 Hr x 3, Kumar, Pardeep, MD   sodium chloride flush (NS) 0.9 % injection 3 mL, 3 mL, Intravenous, Q12H, Adrian Prows, MD   thiamine '500mg'$  in normal saline (82m) IVPB, 500 mg, Intravenous, Q8H, Esparanza Krider B, MD, Last Rate: 100 mL/hr at 07/12/21 0548, 500 mg at 07/12/21 0548  Allergies: Allergies  Allergen Reactions   Penicillins Rash    Has patient had a PCN reaction causing immediate rash, facial/tongue/throat swelling, SOB or lightheadedness with hypotension: Yes Has patient had a PCN reaction causing severe rash involving mucus membranes or skin necrosis: No Has patient had a PCN reaction that required hospitalization No Has patient had a PCN reaction occurring within the last 10 years: No If all of the above answers are "NO", then may proceed with Cephalosporin use.        Objective  Vital signs:  Temp:  [97.5 F (36.4 C)-98.4 F (36.9 C)] 98 F (36.7 C)  (06/22 0754) Pulse Rate:  [59-98] 59 (06/22 0400) Resp:  [15-20] 15 (06/22 0400) BP: (98-153)/(60-89) 98/71 (06/22 0400) SpO2:  [92 %-100 %] 98 % (06/22 0400)  Psychiatric Specialty Exam:  Presentation  General Appearance: Appropriate for Environment; Casual  Eye Contact:Good  Speech:Clear and Coherent  Speech Volume:Normal  Handedness:No data recorded  Mood and Affect  Mood:Euthymic  Affect:Appropriate; Congruent   Thought Process  Thought Processes:Goal Directed  Descriptions of Associations:Circumstantial  Orientation:Full (Time, Place and Person)  Thought Content:Logical  History of Schizophrenia/Schizoaffective disorder:No data recorded Duration of Psychotic Symptoms:No data recorded Hallucinations:Hallucinations: None  Ideas of Reference:None  Suicidal Thoughts:Suicidal Thoughts: No  Homicidal Thoughts:Homicidal Thoughts: No   Sensorium  Memory:Immediate Fair; Recent Fair  Judgment:Fair  Insight:Shallow   Executive Functions  Concentration:Fair  Attention Span:Fair  Recall:No data recorded Fund of Knowledge:Fair  Language:Fair   Psychomotor Activity  Psychomotor Activity:Psychomotor Activity: Decreased   Assets  Assets:Social Support; Desire for Improvement; Housing   Sleep  Sleep:Sleep: Good    Physical Exam: Physical Exam HENT:     Head: Normocephalic and atraumatic.  Pulmonary:     Effort: Pulmonary effort is normal.  Neurological:     Mental Status: She is alert and oriented to person, place, and time.    Review of Systems  Psychiatric/Behavioral:  Negative for depression, hallucinations and suicidal ideas. The patient does not have insomnia.    Blood pressure 98/71, pulse (!) 59, temperature 98 F (36.7 C), temperature source Oral, resp. rate 15, height '5\' 3"'$  (1.6 m), weight 59.1 kg, last menstrual period 01/22/1992, SpO2 98 %. Body mass index is 23.08 kg/m.

## 2021-07-12 NOTE — Interval H&P Note (Signed)
History and Physical Interval Note:  07/12/2021 3:22 PM  Monique Ballard  has presented today for surgery, with the diagnosis of hypoxia.  The various methods of treatment have been discussed with the patient and family. After consideration of risks, benefits and other options for treatment, the patient has consented to  Procedure(s): RIGHT HEART CATH (N/A) PATENT FORAMEN OVALE(PFO) CLOSURE (N/A) as a surgical intervention.  The patient's history has been reviewed, patient examined, no change in status, stable for surgery.  I have reviewed the patient's chart and labs.  Questions were answered to the patient's satisfaction.     Adrian Prows

## 2021-07-12 NOTE — Plan of Care (Signed)

## 2021-07-12 NOTE — Progress Notes (Signed)
81 year old Caucasian female with hypertension, chronic hypoxemia, non-smoker, left breast cancer SP lumpectomy/radiation and chemotherapy, history of esophageal dysphagia requiring esophageal dilatation remotely, admitted with hypoxemia, found to have at least a moderate-sized PFO by transthoracic echocardiogram.  I was asked to consider PFO closure due to diagnosis of platypnea orthodeoxia syndrome.  Patient's daughter is present at the bedside.  Physical Exam Neck:     Vascular: No JVD.  Cardiovascular:     Rate and Rhythm: Normal rate and regular rhythm.     Pulses: Intact distal pulses.     Heart sounds: Normal heart sounds. No murmur heard.    No gallop.  Pulmonary:     Effort: Pulmonary effort is normal.     Breath sounds: Normal breath sounds.  Abdominal:     General: Bowel sounds are normal.     Palpations: Abdomen is soft.  Musculoskeletal:     Right lower leg: No edema.     Left lower leg: No edema.    Lab Results: BMP BNP (last 3 results) Recent Labs    07/08/21 0932  BNP 96.3    ProBNP (last 3 results) No results for input(s): "PROBNP" in the last 8760 hours.    Latest Ref Rng & Units 07/12/2021    3:22 AM 07/10/2021    2:57 AM 07/08/2021   10:00 AM  BMP  Glucose 70 - 99 mg/dL 113  85  133   BUN 8 - 23 mg/dL '9  15  17   ' Creatinine 0.44 - 1.00 mg/dL 0.80  0.75  0.70   Sodium 135 - 145 mmol/L 131  136  133   Potassium 3.5 - 5.1 mmol/L 3.2  3.5  3.2   Chloride 98 - 111 mmol/L 106  108  99   CO2 22 - 32 mmol/L 20  21    Calcium 8.9 - 10.3 mg/dL 8.1  9.2        Latest Ref Rng & Units 07/10/2021    2:57 AM 07/08/2021    9:32 AM 03/03/2020    2:53 PM  Hepatic Function  Total Protein 6.5 - 8.1 g/dL 5.8  7.3  6.0   Albumin 3.5 - 5.0 g/dL 2.9  3.6    AST 15 - 41 U/L 23  25  58   ALT 0 - 44 U/L '21  22  24   ' Alk Phosphatase 38 - 126 U/L 49  70    Total Bilirubin 0.3 - 1.2 mg/dL 0.7  2.1  1.0       Latest Ref Rng & Units 07/12/2021    3:22 AM 07/10/2021     2:57 AM 07/08/2021   10:00 AM  CBC  WBC 4.0 - 10.5 K/uL 9.3  13.0    Hemoglobin 12.0 - 15.0 g/dL 12.9  14.0  18.0   Hematocrit 36.0 - 46.0 % 39.6  42.4  53.0   Platelets 150 - 400 K/uL 248  235     Lipid Panel     Component Value Date/Time   CHOL 161 01/01/2018 1044   TRIG 197.0 (H) 01/01/2018 1044   HDL 47.70 01/01/2018 1044   CHOLHDL 3 01/01/2018 1044   VLDL 39.4 01/01/2018 1044   LDLCALC 74 01/01/2018 1044   Cardiac Panel (last 3 results) No results for input(s): "CKTOTAL", "CKMB", "TROPONINI", "RELINDX" in the last 72 hours.  HEMOGLOBIN A1C Lab Results  Component Value Date   HGBA1C 4.1 03/03/2020   MPG 71 03/03/2020   TSH Recent Labs  07/08/21 1803  TSH 1.324   Cardiac Studies: EKG: 07/08/2021: Sinus tachycardia, 110 bpm, left axis deviation, subtle ST-T changes in the lateral leads cannot rule out ischemia.   Echocardiogram: 07/09/2021: LVEF 55-60%, no regional wall motion abnormalities, grade 1 diastolic impairment, right ventricular size and function normal, no mitral stenosis, aortic valve sclerosis without stenosis, estimated RAP 3 mmHg, positive sonicated saline study suggestive of intra-atrial shunt.    Cardiac MR 07/11/2021: 1.  Normal biventricular chamber size and function. 2.  Qp/Qs is 0.93. 3. No ASD seen. No definite anomalous pulmonary venous return. No  findings to suggest congenital heart lesions or intracardiac shunt   No results found for this or any previous visit (from the past 43800 hour(s)).  Scheduled Meds:  aspirin EC  81 mg Oral Daily   enoxaparin (LOVENOX) injection  40 mg Subcutaneous Q24H   escitalopram  20 mg Oral Daily   folic acid  1 mg Oral Daily   metoprolol tartrate  25 mg Oral BID   multivitamin with minerals  1 tablet Oral Daily   pantoprazole  40 mg Oral Daily   sodium chloride flush  3 mL Intravenous Q12H   Continuous Infusions:  potassium chloride     thiamine injection 500 mg (07/12/21 0548)   PRN  Meds:.acetaminophen, haloperidol  Assessment   1.  Platypnea orthodeoxia syndrome 2.  Most probable PFO with right to left shunting, strongly positive bubble study by transthoracic echocardiogram.  Cardiac MRI does not reveal secundum ASD.  Hence suspect PFO.  She also has appearance of the overriding aorta which is commonly seen in PODS.  Plan:   I have discussed with the patient and also her daughter at the bedside, in a patient with no other risk factors, extremely remote minimal tobacco exposure, that she continues to have significant hypoxemia when she is ambulating.  Her picture does fit into PODS, advised them in the absence of TEE findings, due to esophageal stricture, being a contraindication, will proceed with right heart catheterization and also intracardiac echocardiogram at the same time and if PFO is confirmed, we will proceed with closure.  I have had long discussion regarding risks and benefits of PFO closure including but not limited to bleeding, infection, device embolization, stroke, MI, need for emergent thoracotomy.  Patient is willing to proceed.    Adrian Prows, MD, [facility] 07/12/2021, 9:35 AM Office: [phone] Fax: [phone] Pager: [phone]

## 2021-07-12 NOTE — Progress Notes (Signed)
PT Cancellation Note  Patient Details Name: Monique Ballard MRN: [id number] DOB: December 08, 1940   Cancelled Treatment:    Reason Eval/Treat Not Completed: (P) Patient at procedure or test/unavailable (pt at cath lab). Will continue efforts per PT plan of care as schedule permits.   Loxley Cibrian M Imagene Boss 07/12/2021, 4:12 PM

## 2021-07-13 ENCOUNTER — Inpatient Hospital Stay (HOSPITAL_COMMUNITY): Payer: Medicare Other

## 2021-07-13 DIAGNOSIS — R0902 Hypoxemia: Secondary | ICD-10-CM | POA: Diagnosis not present

## 2021-07-13 LAB — PHOSPHORUS: Phosphorus: 1.6 mg/dL — ABNORMAL LOW (ref 2.5–4.6)

## 2021-07-13 LAB — BASIC METABOLIC PANEL
Anion gap: 8 (ref 5–15)
BUN: 8 mg/dL (ref 8–23)
CO2: 19 mmol/L — ABNORMAL LOW (ref 22–32)
Calcium: 8.5 mg/dL — ABNORMAL LOW (ref 8.9–10.3)
Chloride: 107 mmol/L (ref 98–111)
Creatinine, Ser: 0.63 mg/dL (ref 0.44–1.00)
GFR, Estimated: >60 mL/min (ref >60)
Glucose, Bld: 127 mg/dL — ABNORMAL HIGH (ref 70–99)
Potassium: 3.7 mmol/L (ref 3.5–5.1)
Sodium: 134 mmol/L — ABNORMAL LOW (ref 135–145)

## 2021-07-13 LAB — CULTURE, BLOOD (ROUTINE X 2)
Culture: NO GROWTH
Culture: NO GROWTH

## 2021-07-13 LAB — CBC
HCT: 38.3 % (ref 36.0–46.0)
Hemoglobin: 13.1 g/dL (ref 12.0–15.0)
MCH: 31 pg (ref 26.0–34.0)
MCHC: 34.2 g/dL (ref 30.0–36.0)
MCV: 90.5 fL (ref 80.0–100.0)
Platelets: 241 10*3/uL (ref 150–400)
RBC: 4.23 MIL/uL (ref 3.87–5.11)
RDW: 13.2 % (ref 11.5–15.5)
WBC: 19.3 10*3/uL — ABNORMAL HIGH (ref 4.0–10.5)
nRBC: 0 % (ref 0.0–0.2)

## 2021-07-13 LAB — MAGNESIUM: Magnesium: 1.4 mg/dL — ABNORMAL LOW (ref 1.7–2.4)

## 2021-07-13 LAB — ECHOCARDIOGRAM LIMITED
Height: 63 in
Weight: 2084.67 oz

## 2021-07-13 MED ORDER — SODIUM CHLORIDE 0.9 % IV SOLN
1.0000 g | INTRAVENOUS | Status: DC
  Administered 2021-07-14 – 2021-07-15 (×2): 1 g via INTRAVENOUS
  Filled 2021-07-13 (×2): qty 10

## 2021-07-13 MED ORDER — SODIUM PHOSPHATES 45 MMOLE/15ML IV SOLN
30.0000 mmol | Freq: Once | INTRAVENOUS | Status: AC
  Administered 2021-07-13: 30 mmol via INTRAVENOUS
  Filled 2021-07-13: qty 10

## 2021-07-13 MED ORDER — MAGNESIUM SULFATE 2 GM/50ML IV SOLN
2.0000 g | Freq: Once | INTRAVENOUS | Status: AC
  Administered 2021-07-13: 2 g via INTRAVENOUS
  Filled 2021-07-13: qty 50

## 2021-07-13 MED ORDER — SODIUM CHLORIDE 0.9 % IV SOLN
1.0000 g | Freq: Once | INTRAVENOUS | Status: AC
Start: 2021-07-13 — End: 2021-07-13
  Administered 2021-07-13: 1 g via INTRAVENOUS
  Filled 2021-07-13: qty 10

## 2021-07-13 MED FILL — Heparin Sod (Porcine)-NaCl IV Soln 1000 Unit/500ML-0.9%: INTRAVENOUS | Qty: 1000 | Status: AC

## 2021-07-13 MED FILL — Heparin Sod (Porcine)-NaCl IV Soln 1000 Unit/500ML-0.9%: INTRAVENOUS | Qty: 500 | Status: AC

## 2021-07-13 NOTE — Progress Notes (Addendum)
CARDIAC REHAB PHASE I   Received cardiac rehab consult pt not appropriate per cardiac rehab protocols   Woodroe Chen, RN BSN 07/13/2021 7:34 AM

## 2021-07-14 ENCOUNTER — Inpatient Hospital Stay (HOSPITAL_COMMUNITY): Payer: Medicare Other

## 2021-07-14 DIAGNOSIS — R0902 Hypoxemia: Secondary | ICD-10-CM | POA: Diagnosis not present

## 2021-07-14 LAB — COMPREHENSIVE METABOLIC PANEL
ALT: 21 U/L (ref 0–44)
AST: 27 U/L (ref 15–41)
Albumin: 3.1 g/dL — ABNORMAL LOW (ref 3.5–5.0)
Alkaline Phosphatase: 62 U/L (ref 38–126)
Anion gap: 11 (ref 5–15)
BUN: 8 mg/dL (ref 8–23)
CO2: 19 mmol/L — ABNORMAL LOW (ref 22–32)
Calcium: 8.7 mg/dL — ABNORMAL LOW (ref 8.9–10.3)
Chloride: 105 mmol/L (ref 98–111)
Creatinine, Ser: 0.83 mg/dL (ref 0.44–1.00)
GFR, Estimated: >60 mL/min (ref >60)
Glucose, Bld: 103 mg/dL — ABNORMAL HIGH (ref 70–99)
Potassium: 3.6 mmol/L (ref 3.5–5.1)
Sodium: 135 mmol/L (ref 135–145)
Total Bilirubin: 0.6 mg/dL (ref 0.3–1.2)
Total Protein: 6.3 g/dL — ABNORMAL LOW (ref 6.5–8.1)

## 2021-07-14 LAB — MAGNESIUM: Magnesium: 1.6 mg/dL — ABNORMAL LOW (ref 1.7–2.4)

## 2021-07-14 LAB — PHOSPHORUS: Phosphorus: 1.5 mg/dL — ABNORMAL LOW (ref 2.5–4.6)

## 2021-07-14 LAB — LIPOPROTEIN A (LPA): Lipoprotein (a): 70.2 nmol/L — ABNORMAL HIGH (ref <75.0)

## 2021-07-14 MED ORDER — POTASSIUM PHOSPHATES 15 MMOLE/5ML IV SOLN
30.0000 mmol | Freq: Once | INTRAVENOUS | Status: AC
  Administered 2021-07-14: 30 mmol via INTRAVENOUS
  Filled 2021-07-14: qty 10

## 2021-07-14 MED ORDER — MAGNESIUM SULFATE 2 GM/50ML IV SOLN
2.0000 g | Freq: Once | INTRAVENOUS | Status: AC
  Administered 2021-07-14: 2 g via INTRAVENOUS
  Filled 2021-07-14: qty 50

## 2021-07-14 MED ORDER — POTASSIUM PHOSPHATES 15 MMOLE/5ML IV SOLN: 30.0000 mmol | Freq: Once | INTRAVENOUS | Status: DC

## 2021-07-14 NOTE — Progress Notes (Signed)
Occupational Therapy Treatment Patient Details Name: Monique Ballard MRN: [id number] DOB: 01-09-1941 Today's Date: 07/14/2021   History of present illness Pt is a 81 y/o female admitted secondary to fall and hypoxia. Pt found to have R distal fibular fx and was splinted. Pt also with some hallucinations during hospitalization, but that has since improved. PMH includes breast cancer, alcohol dependency, depression/anxiety.   OT comments  Pt making slow progress with functional goals; pt is limited by R LE NWB restrictions, however is very motivated to work with therapy. Pt's daughter present during session and states that she concerned about going home with only HH therapies, especially after seeing pt having difficulty not being able to maintain R LE NWB with toilet transfer attempt with OT today. Pt and her daughter states that they feel pt needs post acute rehab setting before return home and this OT agrees with this. D/c recommendation being changed to post acute rehab setting   Recommendations for follow up therapy are one component of a multi-disciplinary discharge planning process, led by the attending physician.  Recommendations may be updated based on patient status, additional functional criteria and insurance authorization.    Follow Up Recommendations  Acute inpatient rehab (3hours/day)    Assistance Recommended at Discharge Frequent or constant Supervision/Assistance  Patient can return home with the following  A lot of help with bathing/dressing/bathroom;A lot of help with walking and/or transfers;Assist for transportation;Help with stairs or ramp for entrance;Assistance with cooking/housework   Equipment Recommendations  Wheelchair (measurements OT);Wheelchair cushion (measurements OT);Other (comment);BSC/3in1;Tub/shower seat Lexicographer)    Recommendations for Other Services      Precautions / Restrictions Precautions Precautions: Fall Restrictions Weight Bearing  Restrictions: Yes RLE Weight Bearing: Non weight bearing       Mobility Bed Mobility Overal bed mobility: Needs Assistance Bed Mobility: Sit to Supine       Sit to supine: Mod assist   General bed mobility comments: mod A for SPT from w/c to bed, pt unable to maintain R LE NWB    Transfers Overall transfer level: Needs assistance   Transfers: Sit to/from Stand Sit to Stand: Mod assist   Squat pivot transfers: Mod assist       General transfer comment: min verbal cues to scoot hips to edge on w/c, hand placement and for R LE NWB, however pt unable to maintian NWB during sit - stand from w/c for SPT to toilet and retruned to w/c, Pt also unable to maintain R LE NWB with SPT back to bed with heavy mod A     Balance Overall balance assessment: Needs assistance Sitting-balance support: No upper extremity supported Sitting balance-Leahy Scale: Good     Standing balance support: Bilateral upper extremity supported, During functional activity Standing balance-Leahy Scale: Poor                             ADL either performed or assessed with clinical judgement   ADL Overall ADL's : Needs assistance/impaired Eating/Feeding: Independent           Lower Body Bathing: Moderate assistance;Sitting/lateral leans Lower Body Bathing Details (indicate cue type and reason): simulated seated in w/c         Toilet Transfer: Moderate assistance;Stand-pivot Toilet Transfer Details (indicate cue type and reason): attempted SPT to toilet and pt unable to maintain NWB, cues for correct hand/foot placement required with use of grab bar Toileting- Clothing Manipulation and Hygiene: Total assistance  Functional mobility during ADLs: Moderate assistance      Extremity/Trunk Assessment Upper Extremity Assessment Upper Extremity Assessment: Generalized weakness   Lower Extremity Assessment Lower Extremity Assessment: Defer to PT evaluation   Cervical / Trunk  Assessment Cervical / Trunk Assessment: Kyphotic    Vision Baseline Vision/History: 1 Wears glasses Ability to See in Adequate Light: 0 Adequate Patient Visual Report: No change from baseline     Perception     Praxis      Cognition Arousal/Alertness: Awake/alert Behavior During Therapy: WFL for tasks assessed/performed Overall Cognitive Status: Within Functional Limits for tasks assessed                         Following Commands: Follows multi-step commands consistently       General Comments: Good initiation and participation, pt can be mildly impulsive        Exercises      Shoulder Instructions       General Comments      Pertinent Vitals/ Pain       Pain Assessment Pain Assessment: 0-10 Pain Score: 5  Pain Location: R ankle Pain Descriptors / Indicators: Discomfort Pain Intervention(s): Monitored during session, Limited activity within patient's tolerance, Repositioned  Home Living                                          Prior Functioning/Environment              Frequency  Min 2X/week        Progress Toward Goals  OT Goals(current goals can now be found in the care plan section)  Progress towards OT goals: OT to reassess next treatment     Plan Discharge plan needs to be updated    Co-evaluation                 AM-PAC OT "6 Clicks" Daily Activity     Outcome Measure   Help from another person eating meals?: None Help from another person taking care of personal grooming?: A Little Help from another person toileting, which includes using toliet, bedpan, or urinal?: Total Help from another person bathing (including washing, rinsing, drying)?: A Lot Help from another person to put on and taking off regular upper body clothing?: A Little Help from another person to put on and taking off regular lower body clothing?: Total 6 Click Score: 14    End of Session Equipment Utilized During Treatment:  Gait belt;Other (comment) (w/c, 3 in 1 over toilet)  OT Visit Diagnosis: Unsteadiness on feet (R26.81);Muscle weakness (generalized) (M62.81);Other symptoms and signs involving cognitive function   Activity Tolerance Patient limited by fatigue   Patient Left in bed;with family/visitor present   Nurse Communication          Time: 1050-1116 OT Time Calculation (min): 26 min  Charges: OT General Charges $OT Visit: 1 Visit OT Treatments $Self Care/Home Management : 8-22 mins $Therapeutic Activity: 8-22 mins     Galen Manila 07/14/2021, 12:42 PM

## 2021-07-15 ENCOUNTER — Other Ambulatory Visit: Payer: Self-pay

## 2021-07-15 ENCOUNTER — Inpatient Hospital Stay (HOSPITAL_COMMUNITY)
Admission: EM | Admit: 2021-07-15 | Discharge: 2021-07-27 | DRG: 945 | Disposition: A | Discharge status: Home Health | Payer: Medicare Other | Source: Intra-hospital | Attending: Physical Medicine and Rehabilitation | Admitting: Physical Medicine and Rehabilitation

## 2021-07-15 ENCOUNTER — Encounter (HOSPITAL_COMMUNITY): Payer: Self-pay | Admitting: Physical Medicine & Rehabilitation

## 2021-07-15 DIAGNOSIS — N390 Urinary tract infection, site not specified: Secondary | ICD-10-CM | POA: Diagnosis present

## 2021-07-15 DIAGNOSIS — F419 Anxiety disorder, unspecified: Secondary | ICD-10-CM | POA: Diagnosis present

## 2021-07-15 DIAGNOSIS — E519 Thiamine deficiency, unspecified: Secondary | ICD-10-CM | POA: Diagnosis present

## 2021-07-15 DIAGNOSIS — Z79899 Other long term (current) drug therapy: Secondary | ICD-10-CM

## 2021-07-15 DIAGNOSIS — B965 Pseudomonas (aeruginosa) (mallei) (pseudomallei) as the cause of diseases classified elsewhere: Secondary | ICD-10-CM | POA: Diagnosis present

## 2021-07-15 DIAGNOSIS — J9611 Chronic respiratory failure with hypoxia: Secondary | ICD-10-CM | POA: Diagnosis present

## 2021-07-15 DIAGNOSIS — K219 Gastro-esophageal reflux disease without esophagitis: Secondary | ICD-10-CM | POA: Diagnosis present

## 2021-07-15 DIAGNOSIS — F32A Depression, unspecified: Secondary | ICD-10-CM | POA: Diagnosis present

## 2021-07-15 DIAGNOSIS — Z7982 Long term (current) use of aspirin: Secondary | ICD-10-CM | POA: Diagnosis not present

## 2021-07-15 DIAGNOSIS — I959 Hypotension, unspecified: Secondary | ICD-10-CM | POA: Diagnosis not present

## 2021-07-15 DIAGNOSIS — Z7902 Long term (current) use of antithrombotics/antiplatelets: Secondary | ICD-10-CM | POA: Diagnosis not present

## 2021-07-15 DIAGNOSIS — Z87891 Personal history of nicotine dependence: Secondary | ICD-10-CM

## 2021-07-15 DIAGNOSIS — I10 Essential (primary) hypertension: Secondary | ICD-10-CM | POA: Diagnosis present

## 2021-07-15 DIAGNOSIS — Z880 Allergy status to penicillin: Secondary | ICD-10-CM

## 2021-07-15 DIAGNOSIS — R000 Tachycardia, unspecified: Secondary | ICD-10-CM | POA: Diagnosis present

## 2021-07-15 DIAGNOSIS — Z853 Personal history of malignant neoplasm of breast: Secondary | ICD-10-CM | POA: Diagnosis not present

## 2021-07-15 DIAGNOSIS — F331 Major depressive disorder, recurrent, moderate: Secondary | ICD-10-CM

## 2021-07-15 DIAGNOSIS — R5381 Other malaise: Principal | ICD-10-CM | POA: Diagnosis present

## 2021-07-15 DIAGNOSIS — F4001 Agoraphobia with panic disorder: Secondary | ICD-10-CM

## 2021-07-15 DIAGNOSIS — Z8249 Family history of ischemic heart disease and other diseases of the circulatory system: Secondary | ICD-10-CM

## 2021-07-15 DIAGNOSIS — R339 Retention of urine, unspecified: Secondary | ICD-10-CM | POA: Diagnosis not present

## 2021-07-15 DIAGNOSIS — Z91199 Patient's noncompliance with other medical treatment and regimen due to unspecified reason: Secondary | ICD-10-CM | POA: Diagnosis not present

## 2021-07-15 DIAGNOSIS — S82831D Other fracture of upper and lower end of right fibula, subsequent encounter for closed fracture with routine healing: Secondary | ICD-10-CM | POA: Diagnosis not present

## 2021-07-15 DIAGNOSIS — D751 Secondary polycythemia: Secondary | ICD-10-CM | POA: Diagnosis present

## 2021-07-15 DIAGNOSIS — F1010 Alcohol abuse, uncomplicated: Secondary | ICD-10-CM | POA: Diagnosis present

## 2021-07-15 DIAGNOSIS — Z8774 Personal history of (corrected) congenital malformations of heart and circulatory system: Secondary | ICD-10-CM | POA: Diagnosis not present

## 2021-07-15 DIAGNOSIS — F411 Generalized anxiety disorder: Secondary | ICD-10-CM

## 2021-07-15 DIAGNOSIS — R0902 Hypoxemia: Secondary | ICD-10-CM | POA: Diagnosis not present

## 2021-07-15 LAB — BASIC METABOLIC PANEL
Anion gap: 6 (ref 5–15)
BUN: <5 mg/dL — ABNORMAL LOW (ref 8–23)
CO2: 21 mmol/L — ABNORMAL LOW (ref 22–32)
Calcium: 8.1 mg/dL — ABNORMAL LOW (ref 8.9–10.3)
Chloride: 110 mmol/L (ref 98–111)
Creatinine, Ser: 0.64 mg/dL (ref 0.44–1.00)
GFR, Estimated: >60 mL/min (ref >60)
Glucose, Bld: 111 mg/dL — ABNORMAL HIGH (ref 70–99)
Potassium: 3.4 mmol/L — ABNORMAL LOW (ref 3.5–5.1)
Sodium: 137 mmol/L (ref 135–145)

## 2021-07-15 LAB — CBC
HCT: 37.7 % (ref 36.0–46.0)
HCT: 37.5 % (ref 36.0–46.0)
Hemoglobin: 12.9 g/dL (ref 12.0–15.0)
Hemoglobin: 12.2 g/dL (ref 12.0–15.0)
MCH: 31.5 pg (ref 26.0–34.0)
MCH: 30.9 pg (ref 26.0–34.0)
MCHC: 34.2 g/dL (ref 30.0–36.0)
MCHC: 32.5 g/dL (ref 30.0–36.0)
MCV: 92.2 fL (ref 80.0–100.0)
MCV: 94.9 fL (ref 80.0–100.0)
Platelets: 246 10*3/uL (ref 150–400)
Platelets: 299 10*3/uL (ref 150–400)
RBC: 4.09 MIL/uL (ref 3.87–5.11)
RBC: 3.95 MIL/uL (ref 3.87–5.11)
RDW: 13.9 % (ref 11.5–15.5)
RDW: 14 % (ref 11.5–15.5)
WBC: 12.2 10*3/uL — ABNORMAL HIGH (ref 4.0–10.5)
WBC: 10.7 10*3/uL — ABNORMAL HIGH (ref 4.0–10.5)
nRBC: 0 % (ref 0.0–0.2)
nRBC: 0 % (ref 0.0–0.2)

## 2021-07-15 LAB — PHOSPHORUS: Phosphorus: 2.8 mg/dL (ref 2.5–4.6)

## 2021-07-15 LAB — CREATININE, SERUM
Creatinine, Ser: 0.66 mg/dL (ref 0.44–1.00)
GFR, Estimated: >60 mL/min (ref >60)

## 2021-07-15 LAB — MAGNESIUM: Magnesium: 2.3 mg/dL (ref 1.7–2.4)

## 2021-07-15 MED ORDER — SODIUM CHLORIDE 0.9 % IV SOLN: 2.0000 g | INTRAVENOUS | 0 refills | Status: DC

## 2021-07-15 MED ORDER — METOPROLOL TARTRATE 25 MG PO TABS
25.0000 mg | ORAL_TABLET | Freq: Two times a day (BID) | ORAL | Status: DC
  Administered 2021-07-15 – 2021-07-16 (×3): 25 mg via ORAL
  Filled 2021-07-15 (×4): qty 1

## 2021-07-15 MED ORDER — ASPIRIN 81 MG PO CHEW
81.0000 mg | CHEWABLE_TABLET | Freq: Every day | ORAL | Status: DC
Start: 2021-07-16 — End: 2021-07-27
  Administered 2021-07-16 – 2021-07-27 (×12): 81 mg via ORAL
  Filled 2021-07-15 (×12): qty 1

## 2021-07-15 MED ORDER — PANTOPRAZOLE SODIUM 40 MG PO TBEC
40.0000 mg | DELAYED_RELEASE_TABLET | Freq: Every day | ORAL | Status: DC
  Administered 2021-07-16 – 2021-07-27 (×12): 40 mg via ORAL
  Filled 2021-07-15 (×12): qty 1

## 2021-07-15 MED ORDER — CLOPIDOGREL BISULFATE 75 MG PO TABS
75.0000 mg | ORAL_TABLET | Freq: Every day | ORAL | Status: DC
  Administered 2021-07-16 – 2021-07-27 (×12): 75 mg via ORAL
  Filled 2021-07-15 (×12): qty 1

## 2021-07-15 MED ORDER — SODIUM CHLORIDE 0.9 % IV SOLN
2.0000 g | INTRAVENOUS | Status: DC
  Administered 2021-07-15: 2 g via INTRAVENOUS
  Filled 2021-07-15: qty 12.5

## 2021-07-15 MED ORDER — METOPROLOL TARTRATE 25 MG PO TABS: 25.0000 mg | ORAL_TABLET | Freq: Two times a day (BID) | ORAL | 1 refills | Status: DC

## 2021-07-15 MED ORDER — GERHARDT'S BUTT CREAM
TOPICAL_CREAM | Freq: Three times a day (TID) | CUTANEOUS | Status: DC
  Administered 2021-07-15 – 2021-07-26 (×3): 1 via TOPICAL
  Filled 2021-07-15: qty 1

## 2021-07-15 MED ORDER — ENOXAPARIN SODIUM 40 MG/0.4ML IJ SOSY
40.0000 mg | PREFILLED_SYRINGE | INTRAMUSCULAR | Status: DC
Start: 2021-07-16 — End: 2021-07-27
  Administered 2021-07-16 – 2021-07-26 (×11): 40 mg via SUBCUTANEOUS
  Filled 2021-07-15 (×12): qty 0.4

## 2021-07-15 MED ORDER — ADULT MULTIVITAMIN W/MINERALS CH
1.0000 | ORAL_TABLET | Freq: Every day | ORAL | Status: DC
  Administered 2021-07-16 – 2021-07-27 (×12): 1 via ORAL
  Filled 2021-07-15 (×12): qty 1

## 2021-07-15 MED ORDER — CLOPIDOGREL BISULFATE 75 MG PO TABS: 75.0000 mg | ORAL_TABLET | Freq: Every day | ORAL | 1 refills | Status: DC

## 2021-07-15 MED ORDER — SODIUM CHLORIDE 0.9 % IV SOLN
2.0000 g | INTRAVENOUS | Status: DC
  Administered 2021-07-16: 2 g via INTRAVENOUS
  Filled 2021-07-15: qty 12.5

## 2021-07-15 MED ORDER — FOLIC ACID 1 MG PO TABS
1.0000 mg | ORAL_TABLET | Freq: Every day | ORAL | Status: DC
  Administered 2021-07-16 – 2021-07-27 (×12): 1 mg via ORAL
  Filled 2021-07-15 (×12): qty 1

## 2021-07-15 MED ORDER — ESCITALOPRAM OXALATE 10 MG PO TABS
20.0000 mg | ORAL_TABLET | Freq: Every day | ORAL | Status: DC
  Administered 2021-07-16 – 2021-07-27 (×12): 20 mg via ORAL
  Filled 2021-07-15 (×12): qty 2

## 2021-07-15 NOTE — H&P (Signed)
Physical Medicine and Rehabilitation Admission H&P    No chief complaint on file. : HPI: 81 years old female with PMH significant for chronic hypoxic respiratory failure, noncompliant with home oxygen, alcohol dependence, breast cancer status postlumpectomy and chemotherapy, GERD, anxiety/depression presented with exertional shortness of breath for 2 to 3 days.  At baseline she has chronic exertional dyspnea but has worsened in the last few days.  Patient was using oxygen on and off for last 2 years.  She has not followed up with any pulmonologist.  Patient also sustained a mechanical fall and hit her right ankle and started to feel pain. Patient was admitted for acute on chronic hypoxic respiratory failure requiring BiPAP and nondisplaced right distal fibular fracture. She is successfully weaned down to high flow nasal cannula at 3 L.  Chest x-ray without any infiltrates.  CTA negative for PE.  It was discussed with [facility] Dr. Linna Caprice who recommended to splinter and nonweightbearing follow-up Dr. Odis Hollingshead in 7 days.  Echocardiogram shows intra-atrial shunt.  Cardiology is consulted for TEE.  Patient does have a history of esophageal strictures in the past,  required esophageal dilatation.  GI is consulted for evaluation before TEE.  Esophagogram shows strictures within the cervical esophagus, it can be addressed outpatient.  Review of Systems  Constitutional:  Negative for chills and fever.  HENT:  Negative for hearing loss.   Eyes:  Negative for blurred vision, double vision, discharge and redness.  Respiratory:  Negative for cough, hemoptysis, wheezing and stridor.   Cardiovascular:  Positive for leg swelling. Negative for chest pain, palpitations and orthopnea.  Gastrointestinal:  Negative for heartburn, nausea and vomiting.  Genitourinary:  Negative for flank pain and hematuria.  Musculoskeletal:  Positive for falls. Negative for back pain and neck pain.  Skin: Negative.    Neurological:  Positive for weakness. Negative for dizziness, tremors and headaches.  Endo/Heme/Allergies: Negative.   Psychiatric/Behavioral:  The patient is not nervous/anxious.    Past Medical History:  Diagnosis Date   Anxiety    Breast cancer (HCC) fall 1997    Left Lumpectomy, XRT.  treated with Tamoxifem and Evista   Depression    Endometriosis in her 20's   surgery to help clear endometriosis to obtain a pregnancy.   GERD (gastroesophageal reflux disease)    w/"stretching" remotely   HTN (hypertension)    Migraines    Past Surgical History:  Procedure Laterality Date   BREAST LUMPECTOMY Left fall 1997   Lymph nodes X 3 were negative, ER+, Radiation treatmetn.  Took Tamoxifem X 5 yrs then Evista for 3-5 yrs.   CESAREAN SECTION     x2  first pregnancy breach and second normal   PATENT FORAMEN OVALE(PFO) CLOSURE N/A 07/12/2021   Procedure: PATENT FORAMEN OVALE(PFO) CLOSURE;  Surgeon: Yates Decamp, MD;  Location: [facility] INVASIVE CV LAB;  Service: Cardiovascular;  Laterality: N/A;   RIGHT HEART CATH N/A 07/12/2021   Procedure: RIGHT HEART CATH;  Surgeon: Yates Decamp, MD;  Location: [facility] INVASIVE CV LAB;  Service: Cardiovascular;  Laterality: N/A;   surgery for endometriosis     Family History  Problem Relation Age of Onset   Hypertension Mother    Stroke Mother    Heart attack Father        elderly   Heart disease Father        CHF   Throat cancer Other    Breast cancer Neg Hx    Colon cancer Neg Hx  Diabetes Neg Hx    Neuropathy Neg Hx    Social History:  reports that she quit smoking about 33 years ago. Her smoking use included cigarettes. She has a 12.50 pack-year smoking history. She has never used smokeless tobacco. She reports current alcohol use of about 7.0 standard drinks of alcohol per week. She reports that she does not use drugs. Allergies:  Allergies  Allergen Reactions   Penicillins Rash    Has patient had a PCN reaction causing immediate rash,  facial/tongue/throat swelling, SOB or lightheadedness with hypotension: Yes Has patient had a PCN reaction causing severe rash involving mucus membranes or skin necrosis: No Has patient had a PCN reaction that required hospitalization No Has patient had a PCN reaction occurring within the last 10 years: No If all of the above answers are "NO", then may proceed with Cephalosporin use.    Medications Prior to Admission  Medication Sig Dispense Refill   aspirin EC 81 MG tablet Take 1 tablet (81 mg total) by mouth daily. Swallow whole. 30 tablet 11   benzonatate (TESSALON) 100 MG capsule Take 100 mg by mouth 3 (three) times daily as needed for cough.     [START ON 07/16/2021] ceFEPIme 2 g in sodium chloride 0.9 % 100 mL Inject 2 g into the vein daily. 4 Dose 0   [START ON 07/16/2021] clopidogrel (PLAVIX) 75 MG tablet Take 1 tablet (75 mg total) by mouth daily with breakfast. 30 tablet 1   escitalopram (LEXAPRO) 20 MG tablet TAKE ONE TABLET BY MOUTH ONE TIME DAILY **Schedule appt** (Patient taking differently: Take 20 mg by mouth daily.) 90 tablet 0   folic acid (FOLVITE) 1 MG tablet Take 1 tablet (1 mg total) by mouth daily. 90 tablet 1   metoprolol tartrate (LOPRESSOR) 25 MG tablet Take 1 tablet (25 mg total) by mouth 2 (two) times daily. 60 tablet 1   OLANZapine (ZYPREXA) 7.5 MG tablet TAKE ONE TABLET BY MOUTH DAILY AT BEDTIME (Patient taking differently: Take 7.5 mg by mouth at bedtime.) 90 tablet 0   pantoprazole (PROTONIX) 40 MG tablet Take 1 tablet (40 mg total) by mouth daily. 90 tablet 1   thiamine 100 MG tablet Take 1 tablet (100 mg total) by mouth daily. 30 tablet 6      Home:     Functional History:    Functional Status:  Mobility:          ADL:    Cognition:      Physical Exam: Blood pressure 113/80, pulse 71, temperature 98.5 F (36.9 C), temperature source Oral, resp. rate 16, height 5\' 3"  (1.6 m), weight 56.5 kg, last menstrual period 01/22/1992, SpO2 97  %. Physical Exam Vitals reviewed.  Constitutional:      Appearance: She is normal weight.  HENT:     Head: Normocephalic and atraumatic.     Mouth/Throat:     Mouth: Mucous membranes are moist.  Eyes:     Extraocular Movements: Extraocular movements intact.     Conjunctiva/sclera: Conjunctivae normal.     Pupils: Pupils are equal, round, and reactive to light.  Cardiovascular:     Rate and Rhythm: Normal rate and regular rhythm.     Heart sounds: Normal heart sounds.  Pulmonary:     Effort: Pulmonary effort is normal.     Breath sounds: Normal breath sounds.  Abdominal:     General: Abdomen is flat. Bowel sounds are normal. There is no distension.     Palpations: Abdomen is  soft.  Musculoskeletal:     Comments: RLE with SL splint bruising at right toes  Skin:    General: Skin is warm and dry.  Neurological:     Mental Status: She is alert and oriented to person, place, and time.     Sensory: No sensory deficit.     Motor: Weakness present.     Gait: Gait abnormal.     Comments: 5/5 in BUE 4/5 B HF and KE, L DF PF, RLE in splint  Sensation intact to LT  Psychiatric:        Mood and Affect: Mood normal.        Behavior: Behavior normal.     Results for orders placed or performed during the hospital encounter of 07/08/21 (from the past 48 hour(s))  Comprehensive metabolic panel     Status: Abnormal   Collection Time: 07/14/21 12:43 AM  Result Value Ref Range   Sodium 135 135 - 145 mmol/L   Potassium 3.6 3.5 - 5.1 mmol/L   Chloride 105 98 - 111 mmol/L   CO2 19 (L) 22 - 32 mmol/L   Glucose, Bld 103 (H) 70 - 99 mg/dL    Comment: Glucose reference range applies only to samples taken after fasting for at least 8 hours.   BUN 8 8 - 23 mg/dL   Creatinine, Ser 3.24 0.44 - 1.00 mg/dL   Calcium 8.7 (L) 8.9 - 10.3 mg/dL   Total Protein 6.3 (L) 6.5 - 8.1 g/dL   Albumin 3.1 (L) 3.5 - 5.0 g/dL   AST 27 15 - 41 U/L   ALT 21 0 - 44 U/L   Alkaline Phosphatase 62 38 - 126 U/L    Total Bilirubin 0.6 0.3 - 1.2 mg/dL   GFR, Estimated >40 >10 mL/min    Comment: (NOTE) Calculated using the CKD-EPI Creatinine Equation (2021)    Anion gap 11 5 - 15    Comment: Performed at [facility], [address], [place], [place] [address]  Magnesium     Status: Abnormal   Collection Time: 07/14/21 12:43 AM  Result Value Ref Range   Magnesium 1.6 (L) 1.7 - 2.4 mg/dL    Comment: Performed at [facility], [address], [place], [place] [address]  Phosphorus     Status: Abnormal   Collection Time: 07/14/21 12:43 AM  Result Value Ref Range   Phosphorus 1.5 (L) 2.5 - 4.6 mg/dL    Comment: Performed at [facility], [address], [place], [place] [address]  CBC     Status: Abnormal   Collection Time: 07/15/21  1:33 AM  Result Value Ref Range   WBC 12.2 (H) 4.0 - 10.5 K/uL   RBC 4.09 3.87 - 5.11 MIL/uL   Hemoglobin 12.9 12.0 - 15.0 g/dL   HCT 59.5 63.8 - 75.6 %   MCV 92.2 80.0 - 100.0 fL   MCH 31.5 26.0 - 34.0 pg   MCHC 34.2 30.0 - 36.0 g/dL   RDW 43.3 29.5 - 18.8 %   Platelets 246 150 - 400 K/uL   nRBC 0.0 0.0 - 0.2 %    Comment: Performed at [facility], [address] [place], [place] [address]  Basic metabolic panel     Status: Abnormal   Collection Time: 07/15/21  1:33 AM  Result Value Ref Range   Sodium 137 135 - 145 mmol/L   Potassium 3.4 (L) 3.5 - 5.1 mmol/L   Chloride 110  98 - 111 mmol/L   CO2 21 (L) 22 - 32 mmol/L   Glucose, Bld 111 (H) 70 - 99 mg/dL    Comment: Glucose reference range applies only to samples taken after fasting for at least 8 hours.   BUN <5 (L) 8 - 23 mg/dL   Creatinine, Ser 9.60 0.44 - 1.00 mg/dL   Calcium 8.1 (L) 8.9 - 10.3 mg/dL   GFR, Estimated >45 >40 mL/min    Comment: (NOTE) Calculated using the CKD-EPI Creatinine Equation (2021)    Anion gap 6 5 - 15    Comment: Performed at [facility], [address] [place], [place] [address]  Phosphorus     Status: None   Collection Time: 07/15/21  1:33  AM  Result Value Ref Range   Phosphorus 2.8 2.5 - 4.6 mg/dL    Comment: Performed at [facility], [address], [place], [place] [address]  Magnesium     Status: None   Collection Time: 07/15/21  1:33 AM  Result Value Ref Range   Magnesium 2.3 1.7 - 2.4 mg/dL    Comment: Performed at [facility], [address], [place], [place] [address]   CT ANKLE RIGHT WO CONTRAST  Result Date: 07/14/2021 CLINICAL DATA:  Right ankle fracture. EXAM: CT OF THE RIGHT ANKLE WITHOUT CONTRAST TECHNIQUE: Multidetector CT imaging of the right ankle was performed according to the standard protocol. Multiplanar CT image reconstructions were also generated. RADIATION DOSE REDUCTION: This exam was performed according to the departmental dose-optimization program which includes automated exposure control, adjustment of the mA and/or kV according to patient size and/or use of iterative reconstruction technique. COMPARISON:  Radiograph dated July 08, 2021 FINDINGS: Bones/Joint/Cartilage There is a mildly displaced oblique fracture through the distal fibular metaphysis. There is mild posterior displacement of the distal fracture fragment. There is no appreciable fracture of the tibia talus or calcaneus. The tarsal bones are maintained. Ankle joint and subtalar joints are also maintained. Ligaments Suboptimally assessed by CT. Muscles and Tendons No intramuscular hematoma. The tendons of the flexor, extensor and peroneal compartments are intact. Soft tissues Subcutaneous soft tissue edema prominent about the lateral aspect of the ankle. No fluid collection or hematoma. IMPRESSION: 1. Mildly displaced oblique fracture through the distal fibular metaphysis with mild posterior displacement of the distal fracture fragment. No other appreciable fracture. 2. Ligaments and tendons are intact. No intramuscular hematoma. Subcutaneous 3. Soft tissue swelling without evidence of fluid collection or hematoma. Electronically Signed    By: Larose Hires D.O.   On: 07/14/2021 22:06      Blood pressure 113/80, pulse 71, temperature 98.5 F (36.9 C), temperature source Oral, resp. rate 16, height 5\' 3"  (1.6 m), weight 56.5 kg, last menstrual period 01/22/1992, SpO2 97 %.  Medical Problem List and Plan: 1. Functional deficits secondary to debility after PFO closure with hx recent R Fibular fx NWB x 6wk  -patient may  shower  -ELOS/Goals: 7-10d  Sup 2.  Antithrombotics: -DVT/anticoagulation:  Pharmaceutical: Lovenox  -antiplatelet therapy: ASA and Clopidigrel  3. Pain Management: tylenol;tramadol 4. Mood/Sleep: trazodone  -antipsychotic agents: NA 5. Neuropsych/cognition: This patient is  capable of making decisions on her own behalf. 6. Skin/Wound Care: [facility] for MASD 7. Fluids/Electrolytes/Nutrition: BMET in am , monitor K+ 8.  PFO closed per cardiology Dr Jacinto Halim 9.  R fibular fx in [facility], NWB x  6 wks , f/u [facility] after DC 10.  Sinus tach on BB 11.  Polycythemia CBC in am  12.  UTI >100K Pseudomonas A, Cefepime IV per pharm 13. Hx ETOH use no w  Erick Colace M.D. [facility] Fellow Am Acad of Phys Med and Rehab Diplomate Am Board of Electrodiagnostic Med Fellow Am Board of Interventional Pain   Erick Colace, MD 07/15/2021

## 2021-07-15 NOTE — Progress Notes (Signed)
PMR Admission Coordinator Pre-Admission Assessment   Patient: Monique Ballard is an 81 y.o., female MRN: [id number] DOB: 1940-11-12 Height: 5\' 3"  (160 cm) Weight: 59.1 kg   Insurance Information HMO:     PPO:      PCP:      IPA:      80/20: yes     OTHER:  PRIMARY: Medicare a and b      Policy#: [id number]  Subscriber: pt Benefits:  Phone #: passport one source online     Name: 6/21 Eff. Date: A 12/22/2007, B 07/21/2009     Deduct: $1600      Out of Pocket Max: none      Life Max: none CIR: 100%      SNF: 20 full days Outpatient: 80%     Co-Pay: 205 Home Health: 100%      Co-Pay: none DME: 80%     Co-Pay: 20% Providers: pt choice SECONDARY: [employer]       Policy#: [id number]      Phone#:    Financial Counselor:       Phone#:    The "Data Collection Information Summary" for patients in Inpatient Rehabilitation Facilities with attached "Privacy Act Statement-Health Care Records" was provided and verbally reviewed with: Patient   Emergency Contact Information Contact Information       Name Relation Home Work Mobile    [place] Son     [phone]    Zuliana, [place] Spouse     [phone]    [place] Living Daughter     [phone]           Current Medical History  Patient Admitting Diagnosis: Ankle Fx PFO repair  History of Present Illness: This 81 years old female with PMH significant for chronic hypoxic respiratory failure, noncompliant with home oxygen, alcohol dependence, breast cancer status postlumpectomy and chemotherapy, GERD, anxiety/depression presented with exertional shortness of breath for 2 to 3 days.  At baseline she has chronic exertional dyspnea but has worsened in the last few days.  Patient was using oxygen on and off for last 2 years.  She has not followed up with any pulmonologist.  Patient also sustained a mechanical fall and hit her right ankle and started to feel pain.Patient was admitted to [facility] 07/08/21 for acute on  chronic hypoxic respiratory failure requiring BiPAP and nondisplaced right distal fibular fracture. During admission, she was  successfully weaned down to high flow nasal cannula at 3 L.  Chest x-ray without any infiltrates.  CTA negative for PE.  It was discussed with [facility] Dr. Linna Caprice who recommended to splint her ankle and remain nonweightbearing on RLE until  follow-up Dr. Odis Hollingshead the week following discharge.  Echocardiogram showed intra-atrial shunt.  Cardiology was consulted for TEE. Was not completed, as esophagogram showed strictures within the cervical esophagus. Patient underwent RHC and intracardiac echo to successfully close PFO/ASD. She tolerated procedure well. Cardio recommended Aspirin indefinitely, Plavix for 30 days along with aspirin, endocarditis prophylaxis for 6 months.  PT. With history of EtoH abuse, placed on CIWA protocol but showed no signs of active withdrawal. WBCs also elevated and UA consistent with UTI. She was started on ceftriaxone for that. Pt. Was seen by PT/OT and they recommended CIR to assist return to PLOF.    Complete NIHSS TOTAL: 0   Patient's medical record from [facility]. has been reviewed by the rehabilitation admission coordinator and physician.   Past Medical History  Past Medical History:  Diagnosis Date   Anxiety     Breast cancer (HCC) fall 1997     Left Lumpectomy, XRT.  treated with Tamoxifem and Evista   Depression     Endometriosis in her 20's    surgery to help clear endometriosis to obtain a pregnancy.   GERD (gastroesophageal reflux disease)      w/"stretching" remotely   HTN (hypertension)     Migraines        Has the patient had major surgery during 100 days prior to admission? Yes   Family History   family history includes Heart attack in her father; Heart disease in her father; Hypertension in her mother; Stroke in her mother; Throat cancer in an other family member.   Current Medications    Current Facility-Administered Medications:    0.9 %  sodium chloride infusion, 250 mL, Intravenous, PRN, Yates Decamp, MD, Last Rate: 10 mL/hr at 07/14/21 0521, 250 mL at 07/14/21 0521   acetaminophen (TYLENOL) tablet 650 mg, 650 mg, Oral, Q6H PRN, Yates Decamp, MD, 650 mg at 07/09/21 2014   aspirin EC tablet 81 mg, 81 mg, Oral, Daily, Yates Decamp, MD, 81 mg at 07/15/21 0920   cefTRIAXone (ROCEPHIN) 1 g in sodium chloride 0.9 % 100 mL IVPB, 1 g, Intravenous, Q24H, Carollee Herter, DO, Last Rate: 200 mL/hr at 07/15/21 0015, 1 g at 07/15/21 0015   clopidogrel (PLAVIX) tablet 75 mg, 75 mg, Oral, Q breakfast, Yates Decamp, MD, 75 mg at 07/15/21 0920   enoxaparin (LOVENOX) injection 40 mg, 40 mg, Subcutaneous, Q24H, Yates Decamp, MD, 40 mg at 07/14/21 1407   escitalopram (LEXAPRO) tablet 20 mg, 20 mg, Oral, Daily, Yates Decamp, MD, 20 mg at 07/15/21 6962   folic acid (FOLVITE) tablet 1 mg, 1 mg, Oral, Daily, Yates Decamp, MD, 1 mg at 07/15/21 0920   metoprolol tartrate (LOPRESSOR) tablet 25 mg, 25 mg, Oral, BID, Yates Decamp, MD, 25 mg at 07/15/21 0920   multivitamin with minerals tablet 1 tablet, 1 tablet, Oral, Daily, Yates Decamp, MD, 1 tablet at 07/15/21 0923   ondansetron (ZOFRAN) injection 4 mg, 4 mg, Intravenous, Q6H PRN, Yates Decamp, MD   pantoprazole (PROTONIX) EC tablet 40 mg, 40 mg, Oral, Daily, Yates Decamp, MD, 40 mg at 07/15/21 9528   sodium chloride flush (NS) 0.9 % injection 3 mL, 3 mL, Intravenous, Q12H, Yates Decamp, MD, 3 mL at 07/14/21 1417   sodium chloride flush (NS) 0.9 % injection 3 mL, 3 mL, Intravenous, Q12H, Yates Decamp, MD, 3 mL at 07/14/21 2145   sodium chloride flush (NS) 0.9 % injection 3 mL, 3 mL, Intravenous, PRN, Yates Decamp, MD   thiamine tablet 100 mg, 100 mg, Oral, Daily, Yates Decamp, MD   Patients Current Diet:  Diet Order                  Diet Heart Room service appropriate? Yes; Fluid consistency: Thin  Diet effective now                         Precautions /  Restrictions Precautions Precautions: Fall Precaution Comments: watch vitals (orthostatic & hypoxic earlier in admission) Restrictions Weight Bearing Restrictions: Yes RLE Weight Bearing: Non weight bearing    Has the patient had 2 or more falls or a fall with injury in the past year? Yes   Prior Activity Level Community (5-7x/wk): Pt. active in the community PTA   Prior Functional Level Self  Care: Did the patient need help bathing, dressing, using the toilet or eating? Independent   Indoor Mobility: Did the patient need assistance with walking from room to room (with or without device)? Independent   Stairs: Did the patient need assistance with internal or external stairs (with or without device)? Independent   Functional Cognition: Did the patient need help planning regular tasks such as shopping or remembering to take medications? Independent   Patient Information Are you of Hispanic, Latino/a,or Spanish origin?: A. No, not of Hispanic, Latino/a, or Spanish origin What is your race?: A. White Do you need or want an interpreter to communicate with a doctor or health care staff?: 0. No   Patient's Response To:  Health Literacy and Transportation Is the patient able to respond to health literacy and transportation needs?: Yes Health Literacy - How often do you need to have someone help you when you read instructions, pamphlets, or other written material from your doctor or pharmacy?: Never In the past 12 months, has lack of transportation kept you from medical appointments or from getting medications?: No In the past 12 months, has lack of transportation kept you from meetings, work, or from getting things needed for daily living?: No   Journalist, newspaper / Equipment Home Assistive Devices/Equipment: Bedside commode/3-in-1, Medical laboratory scientific officer (specify quad or straight), Grab bars in shower Home Equipment: Grab bars - toilet, BSC/3in1, Grab bars - tub/shower, Shower seat, Cane - quad,  Rollator (4 wheels)   Prior Device Use: Indicate devices/aids used by the patient prior to current illness, exacerbation or injury?  cane   Current Functional Level Cognition   Overall Cognitive Status: Within Functional Limits for tasks assessed Orientation Level: Oriented X4 Following Commands: Follows multi-step commands consistently Safety/Judgement: Decreased awareness of deficits, Decreased awareness of safety General Comments: Good recall of instructions on placement of body and w/c to improve transfer safety and ease. Needs help to physically maintain NWB on R leg.    Extremity Assessment (includes Sensation/Coordination)   Upper Extremity Assessment: Generalized weakness  Lower Extremity Assessment: Defer to PT evaluation RLE Deficits / Details: RLE in splint throughout.     ADLs   Overall ADL's : Needs assistance/impaired Eating/Feeding: Independent Grooming: Set up, Sitting Upper Body Bathing: Minimal assistance, Sitting Lower Body Bathing: Moderate assistance, Sitting/lateral leans Lower Body Bathing Details (indicate cue type and reason): simulated seated in w/c Upper Body Dressing : Minimal assistance, Sitting Lower Body Dressing: Total assistance, Sit to/from stand Toilet Transfer: Moderate assistance, Tax adviser Details (indicate cue type and reason): attempted SPT to toilet and pt unable to maintain NWB, cues for correct hand/foot placement required with use of grab bar Toileting- Clothing Manipulation and Hygiene: Total assistance Functional mobility during ADLs: Moderate assistance     Mobility   Overal bed mobility: Needs Assistance Bed Mobility: Rolling, Sidelying to Sit Rolling: Supervision Sidelying to sit: Supervision Supine to sit: Min assist Sit to supine: Mod assist General bed mobility comments: Supervision and extra time with cues to roll to L using bed rail and sit up L EOB, bed flat. Simulated home set-up as pt exits L side of bed  with bed flat and use of bed rail at home.     Transfers   Overall transfer level: Needs assistance Equipment used: Rolling walker (2 wheels), None Transfers: Sit to/from Stand, Bed to chair/wheelchair/BSC Sit to Stand: Mod assist Bed to/from chair/wheelchair/BSC transfer type:: Stand pivot, Lateral/scoot transfer Stand pivot transfers: Min assist Squat pivot transfers: Mod assist  Lateral/Scoot Transfers: Min assist General transfer comment: Lateral scoot transfers bed <> w/c towards her L 2x with minA to clear the gap and cues to maintain NWB and scoot to edge of surfaces to get close to gap for clearance. Stand pivot to L bed > w/c 1x with RW and modA to power up and transition weight anteriorly to stand then minA to maintain stability with pivot and keep R foot off ground with support under R thigh.     Ambulation / Gait / Stairs / Risk manager Details: deferred Naval architect mobility: Yes Wheelchair propulsion: Both upper extremities, Left lower extremity Wheelchair parts: Needs assistance Distance: 35 Wheelchair Assistance Details (indicate cue type and reason): MinA to steer in tight spaces and cuing for hand placement, propulsion of wheels, and steering with hands and L leg.     Posture / Balance Dynamic Sitting Balance Sitting balance - Comments: Good static seated balance, intermittent UE support needed Balance Overall balance assessment: Needs assistance Sitting-balance support: No upper extremity supported, Feet supported Sitting balance-Leahy Scale: Good Sitting balance - Comments: Good static seated balance, intermittent UE support needed Standing balance support: Bilateral upper extremity supported Standing balance-Leahy Scale: Poor Standing balance comment: Reliant on UE and external support     Special needs/care consideration Special service needs none     Previous Home Environment (from acute therapy  documentation) Living Arrangements: Spouse/significant other, Children  Lives With: Spouse Available Help at Discharge: Family, Available 24 hours/day Type of Home: House Home Layout: One level Home Access: Stairs to enter Entrance Stairs-Rails: Left, Right, Can reach both Entrance Stairs-Number of Steps: 2 Bathroom Shower/Tub: Health visitor: Standard Home Care Services: No   Discharge Living Setting Plans for Discharge Living Setting: Patient's home Type of Home at Discharge: House Discharge Home Layout: One level Discharge Home Access: Stairs to enter Entrance Stairs-Rails: None Entrance Stairs-Number of Steps: 2 Discharge Bathroom Shower/Tub: Walk-in shower Discharge Bathroom Toilet: Standard Discharge Bathroom Accessibility: Yes How Accessible: Accessible via walker Does the patient have any problems obtaining your medications?: No   Social/Family/Support Systems Patient Roles: Spouse Contact Information: [phone] Anticipated Caregiver: Jaylyn Byrnes Anticipated Caregiver's Contact Information: [phone] Ability/Limitations of Caregiver: 24/7 supervisoin to min A Caregiver Availability: 24/7 Discharge Plan Discussed with Primary Caregiver: Yes Is Caregiver In Agreement with Plan?: Yes Does Caregiver/Family have Issues with Lodging/Transportation while Pt is in Rehab?: No   Goals Patient/Family Goal for Rehab: PT.OT Supervision Expected length of stay: 7-10 days Pt/Family Agrees to Admission and willing to participate: Yes Program Orientation Provided & Reviewed with Pt/Caregiver Including Roles  & Responsibilities: Yes   Decrease burden of Care through IP rehab admission: Specialzed equipment needs, Decrease number of caregivers, and Bowel and bladder program   Possible need for SNF placement upon discharge: Not anticipated    Patient Condition: I have reviewed medical records from [facility], spoken with CM, and patient. I  met with patient at the bedside for inpatient rehabilitation assessment.  Patient will benefit from ongoing PT and OT, can actively participate in 3 hours of therapy a day 5 days of the week, and can make measurable gains during the admission.  Patient will also benefit from the coordinated team approach during an Inpatient Acute Rehabilitation admission.  The patient will receive intensive therapy as well as Rehabilitation physician, nursing, social worker, and care management interventions.  Due to safety, skin/wound care, disease management, medication administration, pain management, and patient education  the patient requires 24 hour a day rehabilitation nursing.  The patient is currently min-mod A with mobility and basic ADLs.  Discharge setting and therapy post discharge at home with home health is anticipated.  Patient has agreed to participate in the Acute Inpatient Rehabilitation Program and will admit today.   Preadmission Screen Completed By:  Jeronimo Greaves, 07/15/2021 9:24 AM ______________________________________________________________________   Discussed status with Dr. Bryson Dames  on 07/15/21  at 07/15/21 and received approval for admission today.   Admission Coordinator:  Moises Blood, time 935 07/15/21    Assessment/Plan: Diagnosis:Debility  Does the need for close, 24 hr/day Medical supervision in concert with the patient's rehab needs make it unreasonable for this patient to be served in a less intensive setting? Yes Co-Morbidities requiring supervision/potential complications: CHronic hypoxia, hx breast ca, RIght fibular fx  Due to bladder management, bowel management, safety, skin/wound care, disease management, medication administration, pain management, and patient education, does the patient require 24 hr/day rehab nursing? Yes Does the patient require coordinated care of a physician, rehab nurse, PT, OT, and SLP to address physical and functional deficits in the context  of the above medical diagnosis(es)? Yes Addressing deficits in the following areas: balance, endurance, locomotion, strength, transferring, bowel/bladder control, bathing, dressing, feeding, grooming, toileting, and psychosocial support Can the patient actively participate in an intensive therapy program of at least 3 hrs of therapy 5 days a week? Yes The potential for patient to make measurable gains while on inpatient rehab is good Anticipated functional outcomes upon discharge from inpatient rehab: supervision PT, supervision OT, n/a SLP Estimated rehab length of stay to reach the above functional goals is: 7-10d Anticipated discharge destination: Home 10. Overall Rehab/Functional Prognosis: good     MD Signature: Erick Colace M.D. [facility] Fellow Am Acad of Phys Med and Rehab Diplomate Am Board of Electrodiagnostic Med Fellow Am Board of Interventional Pain

## 2021-07-15 NOTE — TOC Transition Note (Signed)
Transition of Care [facility]) - CM/SW Discharge Note   Patient Details  Name: Monique Ballard MRN: [id number] Date of Birth: 04-15-1940  Transition of [facility] CM/SW Contact:  Danie Chandler., RN Phone Number: 07/15/2021, 9:59 AM  Clinical Narrative:     RNCM notified [facility] that patient will be transferring to CIR today.  Final next level of care: Home w Home Health Services Barriers to Discharge: No Barriers Identified  Patient Goals and CMS Choice Patient states their goals for this hospitalization and ongoing recovery are:: to go home CMS Medicare.gov Compare Post Acute Care list provided to:: Patient Choice offered to / list presented to : Patient Discharge Placement                      Discharge Plan and Services   Discharge Planning Services: CM Consult Post Acute Care Choice: Home Health, Durable Medical Equipment          DME Arranged: Wheelchair manual   Date DME Agency Contacted: 07/13/21 Time DME Agency Contacted: 1240 Representative spoke with at DME Agency: Darral Dash HH Arranged: RN HH Agency: [facility] Date [facility] Agency Contacted: 07/10/21 Time HH Agency Contacted: 1431 Representative spoke with at [facility] Agency: Clearence Ped  Social Determinants of Health (SDOH) Interventions   Readmission Risk Interventions     No data to display

## 2021-07-16 LAB — URINE CULTURE: Culture: >=100000 — AB

## 2021-07-16 MED ORDER — POTASSIUM CHLORIDE CRYS ER 20 MEQ PO TBCR
40.0000 meq | EXTENDED_RELEASE_TABLET | Freq: Once | ORAL | Status: AC
  Administered 2021-07-16: 40 meq via ORAL
  Filled 2021-07-16: qty 2

## 2021-07-16 MED ORDER — CIPROFLOXACIN HCL 250 MG PO TABS
500.0000 mg | ORAL_TABLET | Freq: Two times a day (BID) | ORAL | Status: AC
  Administered 2021-07-16 – 2021-07-21 (×10): 500 mg via ORAL
  Filled 2021-07-16 (×10): qty 2

## 2021-07-16 NOTE — Progress Notes (Signed)
PROGRESS NOTE   Subjective/Complaints:  Pt reports  no pain as long as doesn't put RLE on floor/weight bear- and is NWB x 6+ weeks.  LBM yesterday.  Voiding well.  On Cefipime, however heard via Dr Harvel Quale Wynn Banker, can d/c Cefepime and start Cipro BID x 5 days- have done so.   K+ was 3.4 yesterday afternoon- will replete and recheck in AM   ROS:  Pt denies SOB, abd pain, CP, N/V/C/D, and vision changes   Objective:   CT ANKLE RIGHT WO CONTRAST  Result Date: 07/14/2021 CLINICAL DATA:  Right ankle fracture. EXAM: CT OF THE RIGHT ANKLE WITHOUT CONTRAST TECHNIQUE: Multidetector CT imaging of the right ankle was performed according to the standard protocol. Multiplanar CT image reconstructions were also generated. RADIATION DOSE REDUCTION: This exam was performed according to the departmental dose-optimization program which includes automated exposure control, adjustment of the mA and/or kV according to patient size and/or use of iterative reconstruction technique. COMPARISON:  Radiograph dated July 08, 2021 FINDINGS: Bones/Joint/Cartilage There is a mildly displaced oblique fracture through the distal fibular metaphysis. There is mild posterior displacement of the distal fracture fragment. There is no appreciable fracture of the tibia talus or calcaneus. The tarsal bones are maintained. Ankle joint and subtalar joints are also maintained. Ligaments Suboptimally assessed by CT. Muscles and Tendons No intramuscular hematoma. The tendons of the flexor, extensor and peroneal compartments are intact. Soft tissues Subcutaneous soft tissue edema prominent about the lateral aspect of the ankle. No fluid collection or hematoma. IMPRESSION: 1. Mildly displaced oblique fracture through the distal fibular metaphysis with mild posterior displacement of the distal fracture fragment. No other appreciable fracture. 2. Ligaments and tendons are intact. No  intramuscular hematoma. Subcutaneous 3. Soft tissue swelling without evidence of fluid collection or hematoma. Electronically Signed   By: Larose Hires D.O.   On: 07/14/2021 22:06   Recent Labs    07/15/21 0133 07/15/21 1508  WBC 12.2* 10.7*  HGB 12.9 12.2  HCT 37.7 37.5  PLT 246 299   Recent Labs    07/14/21 0043 07/15/21 0133 07/15/21 1508  NA 135 137  --   K 3.6 3.4*  --   CL 105 110  --   CO2 19* 21*  --   GLUCOSE 103* 111*  --   BUN 8 <5*  --   CREATININE 0.83 0.64 0.66  CALCIUM 8.7* 8.1*  --     Intake/Output Summary (Last 24 hours) at 07/16/2021 1317 Last data filed at 07/15/2021 1830 Gross per 24 hour  Intake 240 ml  Output --  Net 240 ml        Physical Exam: Vital Signs Blood pressure 131/67, pulse 67, temperature 98 F (36.7 C), resp. rate 16, height 5\' 3"  (1.6 m), weight 56.5 kg, last menstrual period 01/22/1992, SpO2 100 %.   General: awake, alert, appropriate, sitting up slightly in bed; nurse and daughter in room; NAD HENT: conjugate gaze; oropharynx moist CV: regular rate; no JVD Pulmonary: CTA B/L; no W/R/R- good air movement GI: soft, NT, ND, (+)BS Psychiatric: appropriate Neurological: Ox3 Musculoskeletal:     Comments: RLE with CAM boot/ splint bruising at right toes -  entire foot bruised on R Skin:    General: Skin is warm and dry.  Neurological:     Mental Status: She is alert and oriented to person, place, and time.     Sensory: No sensory deficit.     Motor: Weakness present.     Gait: Gait abnormal.     Comments: 5/5 in BUE 4/5 B HF and KE, L DF PF, RLE in splint   Sensation intact to LT   Assessment/Plan: 1. Functional deficits which require 3+ hours per day of interdisciplinary therapy in a comprehensive inpatient rehab setting. Physiatrist is providing close team supervision and 24 hour management of active medical problems listed below. Physiatrist and rehab team continue to assess barriers to discharge/monitor patient  progress toward functional and medical goals  Care Tool:  Bathing    Body parts bathed by patient: Right arm, Left arm, Chest, Abdomen, Front perineal area, Buttocks, Right upper leg, Left upper leg, Right lower leg, Left lower leg, Face         Bathing assist Assist Level: Moderate Assistance - Patient 50 - 74%     Upper Body Dressing/Undressing Upper body dressing   What is the patient wearing?: Pull over shirt, Bra    Upper body assist Assist Level: Supervision/Verbal cueing    Lower Body Dressing/Undressing Lower body dressing      What is the patient wearing?: Underwear/pull up, Pants     Lower body assist Assist for lower body dressing: Moderate Assistance - Patient 50 - 74%     Toileting Toileting    Toileting assist Assist for toileting: Maximal Assistance - Patient 25 - 49%     Transfers Chair/bed transfer  Transfers assist     Chair/bed transfer assist level: Moderate Assistance - Patient 50 - 74%     Locomotion Ambulation   Ambulation assist      Assist level: Moderate Assistance - Patient 50 - 74% Assistive device: Parallel bars Max distance: 4   Walk 10 feet activity   Assist     Assist level: Maximal Assistance - Patient 25 - 49% Assistive device: Parallel bars   Walk 50 feet activity   Assist Walk 50 feet with 2 turns activity did not occur: Safety/medical concerns         Walk 150 feet activity   Assist Walk 150 feet activity did not occur: Safety/medical concerns         Walk 10 feet on uneven surface  activity   Assist Walk 10 feet on uneven surfaces activity did not occur: Safety/medical concerns         Wheelchair     Assist Is the patient using a wheelchair?: Yes Type of Wheelchair: Manual    Wheelchair assist level: Minimal Assistance - Patient > 75% Max wheelchair distance: 60    Wheelchair 50 feet with 2 turns activity    Assist        Assist Level: Minimal Assistance - Patient  > 75%   Wheelchair 150 feet activity     Assist      Assist Level: Maximal Assistance - Patient 25 - 49%   Blood pressure 131/67, pulse 67, temperature 98 F (36.7 C), resp. rate 16, height 5\' 3"  (1.6 m), weight 56.5 kg, last menstrual period 01/22/1992, SpO2 100 %.  Medical Problem List and Plan: 1. Functional deficits secondary to debility after PFO closure with hx recent R Fibular fx NWB x 6wk             -  patient may  shower             -ELOS/Goals: 7-10d  Sup  Con't CIR- first day of evaluations- con't PT and OT- NWB on RLE x6 weeks 2.  Antithrombotics: -DVT/anticoagulation:  Pharmaceutical: Lovenox             -antiplatelet therapy: ASA and Clopidigrel   3. Pain Management: tylenol;tramadol  6/26- no pain as long as NWB maintained- con't regimen 4. Mood/Sleep: trazodone             -antipsychotic agents: NA 5. Neuropsych/cognition: This patient is  capable of making decisions on her own behalf. 6. Skin/Wound Care: Gerhardt's for MASD  6/26- severe per nursing- wasn't able to assess- if doesn't improve, will give a few days of Diflucan 7. Fluids/Electrolytes/Nutrition: BMET in am , monitor K+  6/26- K+ 3.4- will give 40 mEq x1 and recheck in AM 8.  PFO closed per cardiology Dr Jacinto Halim 9.  R fibular fx in [facility], NWB x  6 wks , f/u [facility] after DC 10.  Sinus tach on BB 11.  Polycythemia CBC in am  12.  UTI >100K Pseudomonas A, Cefepime IV per pharm  6/26- Dr Lucianne Muss suggested switching to Cipro 500 mg BID x 5 days- have done so and stopped Cefepime- will recheck labs in AM 13. Hx ETOH use - none currently- cessation education    I spent a total of 38   minutes on total care today- >50% coordination of care- due to d/w Dr Wynn Banker about Cipro and pharmacy about K+. Also nursing about MASD   LOS: 1 days A FACE TO FACE EVALUATION WAS PERFORMED  Taheerah Guldin 07/16/2021, 1:17 PM

## 2021-07-16 NOTE — Discharge Summary (Signed)
Physician Discharge Summary  Patient ID: Monique Ballard MRN: [id number] DOB/AGE: [birth year] 81 y.o.  Admit date: 07/15/2021 Discharge date: 07/27/2021  Discharge Diagnoses:  Principal Problem:   Debility Active problems: Debility secondary to ankle fracture Cute on chronic hypoxic respiratory failure Polycythemia Alcohol abuse UTI secondary to Pseudomonas Intra-atrial shunt status post closure Esophageal stricture Sinus tachycardia Hypokalemia MASD Vitamin B1 deficiency  Discharged Condition: good  Labs:   Basic Metabolic Panel:     Latest Ref Rng & Units 07/18/2021    5:41 AM 07/17/2021    6:30 AM 07/15/2021    3:08 PM  BMP  Glucose 70 - 99 mg/dL 100  99    BUN 8 - 23 mg/dL 6  7    Creatinine 0.44 - 1.00 mg/dL 0.61  0.68  0.66   Sodium 135 - 145 mmol/L 138  137    Potassium 3.5 - 5.1 mmol/L 3.9  4.6    Chloride 98 - 111 mmol/L 109  108    CO2 22 - 32 mmol/L 23  25    Calcium 8.9 - 10.3 mg/dL 8.7  8.9       CBC:    Latest Ref Rng & Units 07/18/2021    5:41 AM 07/17/2021    6:30 AM 07/15/2021    3:08 PM  CBC  WBC 4.0 - 10.5 K/uL 10.8  11.2  10.7   Hemoglobin 12.0 - 15.0 g/dL 11.5  11.8  12.2   Hematocrit 36.0 - 46.0 % 33.2  36.0  37.5   Platelets 150 - 400 K/uL 285  310  299     CBG: Recent Labs  Lab 07/24/21 0612  GLUCAP 95    Brief HPI:   Monique Ballard is a 81 y.o. female who presented to the emergency department on/18/2023 exertional dyspnea and hypoxia.  She required BiPAP and then switched to nonrebreather 15 L.  Work-up was negative for PE or pneumonia.  She underwent right heart cath with PFO closure by Dr. Einar Gip and placed on Plavix and aspirin for 30 days followed by aspirin indefinitely.  CT scan of the right ankle revealed nondisplaced fracture and she is nonweightbearing for 6 weeks.   Hospital Course: Monique Ballard was admitted to rehab 07/15/2021 for inpatient therapies to consist of PT, ST and OT at least three hours five days a week. Past  admission physiatrist, therapy team and rehab RN have worked together to provide customized collaborative inpatient rehab.  Canal discontinue cefepime and was started on Cipro twice daily for 7 days on 6/26. Potassium level noted 3.4 and was repleted on 6/26. Urinary retention on 6/27 required I/O cath. Remained afebrile with WBC trending downward.  Hypotensive/27 and TED hose ordered as well as encouraging at least 6 to 8 cups of water daily.  UTI symptoms improved. Thiamine level checked per family request. Results <6 continue supplementation.  Blood pressures were monitored on TID basis and she was hypotensive on 6/27 and metoprolol 25 mg BID was held.  Resumed on 6/28, but a.m. dose held 6/29 and 6/30. Soft BP persists therefore metoprolol decreased to 12.5 mg BID on 7/1.   Rehab course: During patient's stay in rehab weekly team conferences were held to monitor patient's progress, set goals and discuss barriers to discharge. At admission, patient required mod to maxassist with basic self-care skills/ADLs and mod assist with mobility. Patient discharged at overall min assist level.  She has had improvement in activity tolerance, balance, postural control as well as  ability to compensate for deficits. She has had improvement in functional use RUE/LUE  and RLE/LLE as well as improvement in awareness  Disposition: Home Discharge disposition: 01-Home or Self Care      Diet: Regular  Special Instructions:  Endocarditis prophylaxis for 6 months for dental procedures. No driving, alcohol consumption or tobacco use.  30-35 minutes were spent on discharge planning and discharge summary.    Discharge Instructions     Discharge patient   Complete by: As directed    Discharge disposition: 01-Home or Self Care   Discharge patient date: 07/27/2021      Allergies as of 07/27/2021       Reactions   Penicillins Rash   Has patient had a PCN reaction causing immediate rash, facial/tongue/throat  swelling, SOB or lightheadedness with hypotension: Yes Has patient had a PCN reaction causing severe rash involving mucus membranes or skin necrosis: No Has patient had a PCN reaction that required hospitalization No Has patient had a PCN reaction occurring within the last 10 years: No If all of the above answers are "NO", then may proceed with Cephalosporin use.        Medication List     STOP taking these medications    ceFEPIme 2 g in sodium chloride 0.9 % 100 mL       TAKE these medications    aspirin EC 81 MG tablet Take 1 tablet (81 mg total) by mouth daily. Swallow whole.   benzonatate 100 MG capsule Commonly known as: TESSALON Take 100 mg by mouth 3 (three) times daily as needed for cough.   clopidogrel 75 MG tablet Commonly known as: PLAVIX Take 1 tablet (75 mg total) by mouth daily with breakfast for 18 days.   escitalopram 20 MG tablet Commonly known as: LEXAPRO Take 1 tablet (20 mg total) by mouth daily. What changed: See the new instructions.   folic acid 1 MG tablet Commonly known as: FOLVITE Take 1 tablet (1 mg total) by mouth daily.   Gerhardt's butt cream Crea Apply 1 Application topically 3 (three) times daily.   metoprolol tartrate 25 MG tablet Commonly known as: LOPRESSOR Take 0.5 tablets (12.5 mg total) by mouth 2 (two) times daily. What changed: how much to take   multivitamin with minerals Tabs tablet Take 1 tablet by mouth daily. Start taking on: July 28, 2021   OLANZapine 7.5 MG tablet Commonly known as: ZYPREXA TAKE ONE TABLET BY MOUTH DAILY AT BEDTIME   pantoprazole 40 MG tablet Commonly known as: PROTONIX Take 1 tablet (40 mg total) by mouth daily.   thiamine 100 MG tablet Take 1 tablet (100 mg total) by mouth daily.        Follow-up Information     Colon Branch, MD. Go to.   Specialty: Internal Medicine Why: appointment 08/10/2021 Contact information: [address] [place]  [address] [phone]         Armond Hang, MD. Call.   Specialty: Orthopedic Surgery Why: Call on Monday to make arrangements for hospital follow-up appointment Contact information: [address] [place] [address] [phone]         Rex Kras, DO. Go to.   Specialties: Cardiology, Vascular Surgery Why: appointment 08/02/2021 Contact information: [place] [address] [phone]         Arta Silence, MD. Call.   Specialty: Gastroenterology Why: Call on Monday to make arrangements for hospital follow-up appointment Contact information:  [address] [place] [address] [phone]         Courtney Heys, MD Follow up.   Specialty: Physical Medicine and Rehabilitation Why: As needed Contact information: [address] [place] [address] [phone]                 Signed: Barbie Banner 07/27/2021, 10:39 AM

## 2021-07-17 LAB — COMPREHENSIVE METABOLIC PANEL
ALT: 21 U/L (ref 0–44)
AST: 23 U/L (ref 15–41)
Albumin: 2.7 g/dL — ABNORMAL LOW (ref 3.5–5.0)
Alkaline Phosphatase: 52 U/L (ref 38–126)
Anion gap: 4 — ABNORMAL LOW (ref 5–15)
BUN: 7 mg/dL — ABNORMAL LOW (ref 8–23)
CO2: 25 mmol/L (ref 22–32)
Calcium: 8.9 mg/dL (ref 8.9–10.3)
Chloride: 108 mmol/L (ref 98–111)
Creatinine, Ser: 0.68 mg/dL (ref 0.44–1.00)
GFR, Estimated: >60 mL/min (ref >60)
Glucose, Bld: 99 mg/dL (ref 70–99)
Potassium: 4.6 mmol/L (ref 3.5–5.1)
Sodium: 137 mmol/L (ref 135–145)
Total Bilirubin: 0.7 mg/dL (ref 0.3–1.2)
Total Protein: 5.7 g/dL — ABNORMAL LOW (ref 6.5–8.1)

## 2021-07-17 LAB — CBC WITH DIFFERENTIAL/PLATELET
Abs Immature Granulocytes: 0.2 10*3/uL — ABNORMAL HIGH (ref 0.00–0.07)
Basophils Absolute: 0.1 10*3/uL (ref 0.0–0.1)
Basophils Relative: 0 %
Eosinophils Absolute: 0 10*3/uL (ref 0.0–0.5)
Eosinophils Relative: 0 %
HCT: 36 % (ref 36.0–46.0)
Hemoglobin: 11.8 g/dL — ABNORMAL LOW (ref 12.0–15.0)
Immature Granulocytes: 2 %
Lymphocytes Relative: 21 %
Lymphs Abs: 2.3 10*3/uL (ref 0.7–4.0)
MCH: 30.9 pg (ref 26.0–34.0)
MCHC: 32.8 g/dL (ref 30.0–36.0)
MCV: 94.2 fL (ref 80.0–100.0)
Monocytes Absolute: 1.6 10*3/uL — ABNORMAL HIGH (ref 0.1–1.0)
Monocytes Relative: 15 %
Neutro Abs: 7 10*3/uL (ref 1.7–7.7)
Neutrophils Relative %: 62 %
Platelets: 310 10*3/uL (ref 150–400)
RBC: 3.82 MIL/uL — ABNORMAL LOW (ref 3.87–5.11)
RDW: 14 % (ref 11.5–15.5)
WBC: 11.2 10*3/uL — ABNORMAL HIGH (ref 4.0–10.5)
nRBC: 0 % (ref 0.0–0.2)

## 2021-07-17 MED ORDER — METOPROLOL TARTRATE 25 MG PO TABS
25.0000 mg | ORAL_TABLET | Freq: Two times a day (BID) | ORAL | Status: DC
Start: 2021-07-18 — End: 2021-07-21
  Administered 2021-07-18 – 2021-07-21 (×5): 25 mg via ORAL
  Filled 2021-07-17 (×7): qty 1

## 2021-07-17 NOTE — Progress Notes (Signed)
Pt having urgency and increased frequency with voiding. PVR showed . Provider made aware.Verbal order given for PVR  in/out cath, UA and Culture by Deatra Ina, PA

## 2021-07-17 NOTE — Patient Care Conference (Signed)
Inpatient RehabilitationTeam Conference and Plan of Care Update Date: 07/17/2021   Time: 11:40 AM    Patient Name: Monique Ballard      Medical Record Number: [id number]  Date of Birth: 1941-01-05 Sex: Female         Room/Bed: 4W15C/4W15C-01 Payor Info: Payor: MEDICARE / Plan: MEDICARE PART A AND B / Product Type: *No Product type* /    Admit Date/Time:  07/15/2021  1:25 PM  Primary Diagnosis:  Debility  Hospital Problems: Principal Problem:   Debility    Expected Discharge Date: Expected Discharge Date: 07/27/21  Team Members Present: Physician leading conference: Dr. Genice Rouge Social Worker Present: Dossie Der, LCSW Nurse Present: Kennyth Arnold, RN PT Present: Bernie Covey, PT OT Present: Valetta Fuller, OT SLP Present: Gerda Diss, SLP PPS Coordinator present : Edson Snowball, PT     Current Status/Progress Goal Weekly Team Focus  Bowel/Bladder   Pt is incontinent of bowel/bladder  Pt will gain continence of bowel/bladder  Will assess qshift and PRN   Swallow/Nutrition/ Hydration             ADL's   min-mod A to toilet with 3 in 1 over top and tub transfer bench lateral scoot or modified squat, hard to maintain R LE NWB, close S and min cues UB self care max A LB self care, flat affect but appropriate  min A LB selfcare, CGA transfers  needs tub transfer bench and 3 in 1 commode, progress toward self care and transfer goals with AE and DME   Mobility   min A for assist, but mod-max for NWB, bed with supervision  mod I  strength, endurance   Communication             Safety/Cognition/ Behavioral Observations            Pain   Pt is currently pain free  Pt will remain pain free  Will assess qshift and PRN   Skin   Pt has MASD on buttocks and groin  Pt's MASD will clear up  Will assess qshift and PRN     Discharge Planning:  Home with husband who is able to provide supervision-back issues and HOH. Daughter was here yesterday for evaluation day.   Team  Discussion: Blood pressure 80/52 this morning while sitting. Held Lopressor. Added Ted hose. Bruising to foot from camboot. WBC's elevated today. Patient not drinking enough fluids, staff to push fluids. Incontinent bladder, continent bowel. No reported pain. Severe MASD, using Gerhardts. Discharging home with spouse, very HOH. Daughter here yesterday. Was non-compliant with medications PTA. Can be impulsive. Therapy not focusing on gait. Patient is very weak.   Patient on target to meet rehab goals: Mod I at [facility] level, mod I upper body, CGA lower body goals. Currently min assist mobility, mod/max assist weight bearing. Min assist upper body, max assist lower body. Min/mod assist transfers. May have to downgrade goals.  *See Care Plan and progress notes for long and short-term goals.   Revisions to Treatment Plan:  Adjusting medications, may downgrade therapy goals.   Teaching Needs: Family education, bladder management, medication management, skin/wound care, transfer training, safety awareness, etc.   Current Barriers to Discharge: Decreased caregiver support, Home enviroment access/layout, Incontinence, Wound care, Lack of/limited family support, Weight bearing restrictions, and Medication compliance  Possible Resolutions to Barriers: Family education Follow-up therapy Order recommended DME Daughter to assist more in medical management     Medical Summary Current Status: BP 80/52 this Am-  incontinent bladder/not bowel- impulsive- MASD on bottom and labia- using gerhardt's- understanding not always compliant with meds/O2.  Barriers to Discharge: Weight bearing restrictions;Wound care;Decreased family/caregiver support;Home enviroment access/layout;Medical stability;Medication compliance;Behavior;Incontinence  Barriers to Discharge Comments: going home with husabnd/daughter- NWB on RLE-husband poor hearing and back issues Possible Resolutions to [employer] Focus: mainly min A-  mod-max A for cues- keeps putting weight on foot- held lopressor- don't focus on gait since cannot keep weight off RLE-  goals mod I at w/c level for PT- might need ot downgrade- house 1 level; max A LB ADLs- needs BSC and tub transfer bench- daughter bought w/c- daughter PT at [facility]- focus on UB strengtheing and pain/impulsive- d/c 07/27/21   Continued Need for Acute Rehabilitation Level of Care: The patient requires daily medical management by a physician with specialized training in physical medicine and rehabilitation for the following reasons: Direction of a multidisciplinary physical rehabilitation program to maximize functional independence : Yes Medical management of patient stability for increased activity during participation in an intensive rehabilitation regime.: Yes Analysis of laboratory values and/or radiology reports with any subsequent need for medication adjustment and/or medical intervention. : Yes   I attest that I was present, lead the team conference, and concur with the assessment and plan of the team.   Tennis Must 07/17/2021, 4:38 PM

## 2021-07-18 LAB — CBC WITH DIFFERENTIAL/PLATELET
Abs Immature Granulocytes: 0.34 10*3/uL — ABNORMAL HIGH (ref 0.00–0.07)
Basophils Absolute: 0 10*3/uL (ref 0.0–0.1)
Basophils Relative: 0 %
Eosinophils Absolute: 0 10*3/uL (ref 0.0–0.5)
Eosinophils Relative: 0 %
HCT: 33.2 % — ABNORMAL LOW (ref 36.0–46.0)
Hemoglobin: 11.5 g/dL — ABNORMAL LOW (ref 12.0–15.0)
Immature Granulocytes: 3 %
Lymphocytes Relative: 20 %
Lymphs Abs: 2.2 10*3/uL (ref 0.7–4.0)
MCH: 32 pg (ref 26.0–34.0)
MCHC: 34.6 g/dL (ref 30.0–36.0)
MCV: 92.5 fL (ref 80.0–100.0)
Monocytes Absolute: 2 10*3/uL — ABNORMAL HIGH (ref 0.1–1.0)
Monocytes Relative: 18 %
Neutro Abs: 6.3 10*3/uL (ref 1.7–7.7)
Neutrophils Relative %: 59 %
Platelets: 285 10*3/uL (ref 150–400)
RBC: 3.59 MIL/uL — ABNORMAL LOW (ref 3.87–5.11)
RDW: 14.1 % (ref 11.5–15.5)
WBC: 10.8 10*3/uL — ABNORMAL HIGH (ref 4.0–10.5)
nRBC: 0 % (ref 0.0–0.2)

## 2021-07-18 LAB — URINALYSIS, ROUTINE W REFLEX MICROSCOPIC
Bilirubin Urine: NEGATIVE
Glucose, UA: NEGATIVE mg/dL
Hgb urine dipstick: NEGATIVE
Ketones, ur: NEGATIVE mg/dL
Nitrite: NEGATIVE
Protein, ur: NEGATIVE mg/dL
Specific Gravity, Urine: 1.01 (ref 1.005–1.030)
pH: 6 (ref 5.0–8.0)

## 2021-07-18 LAB — BASIC METABOLIC PANEL
Anion gap: 6 (ref 5–15)
BUN: 6 mg/dL — ABNORMAL LOW (ref 8–23)
CO2: 23 mmol/L (ref 22–32)
Calcium: 8.7 mg/dL — ABNORMAL LOW (ref 8.9–10.3)
Chloride: 109 mmol/L (ref 98–111)
Creatinine, Ser: 0.61 mg/dL (ref 0.44–1.00)
GFR, Estimated: >60 mL/min (ref >60)
Glucose, Bld: 100 mg/dL — ABNORMAL HIGH (ref 70–99)
Potassium: 3.9 mmol/L (ref 3.5–5.1)
Sodium: 138 mmol/L (ref 135–145)

## 2021-07-18 NOTE — Progress Notes (Signed)
Occupational Therapy Session Note  Patient Details  Name: Monique Ballard MRN: [id number] Date of Birth: 03-Dec-1940  Today's Date: 07/18/2021 OT Individual Time: 0800-0900 1st visit, 1415-1500 2nd visit OT Individual Time Calculation (min): 60 min, 45 min    Short Term Goals: Week 1:  OT Short Term Goal 1 (Week 1): Pt to be Mod A for LB bathing/dressing with LRD OT Short Term Goal 2 (Week 1): Pt to be Min A for functional transfers with LRD OT Short Term Goal 3 (Week 1): Pt to be Min A for toileting tasks OT Short Term Goal 4 (Week 1): Pt will maintain NWB status with minimal cuing OT Short Term Goal 5 (Week 1): Pt will be Mod i for UB bathing/dressing  Skilled Therapeutic Interventions/Progress Updates:   1st session:Pt bed level upon OT arrival with no complaints of pain. Pt needed to use commode immediately and was able to participate in improved NWB prec on R with pt able to verbalize understanding back. Pt performed as follows this session: bed to w/c with modified squat pivot and min a with cues to L side, min-mod A to toilet with 3 in 1 over top, pt able to have BM and void. See Flowsheets for details. Mod A for peri hygiene. Sponge bathing, dressing and grooming at sinkside with relatively same status as previous but increased overall ability to sequence and understand LB WB precautions and techniques. VSR: BP: 121/72 and HR 80 BPM. Pt left w/c level with all needs within reach including nurse call bell and chair alarm engaged.    2nd visit:  Pt in bed upon OT arrival but agreeable to all activity presented. Transfer bed to w/c via squat pivot with improved compliance with R LE NWB with min A to L side. Pt was able to self propel w/c to and from day room gym ~ 45 ft with 2 rests each way due to mild fatigue. Pt instructed then completed 2 lb weight bar therapeutic exercise seated level with back away from chair back to address improved posture and core strength for 10 reps x 2 sets of  sh flex/ext, sh press, elbow flex/ex, scap retraction/protraction, trunk flexion and ex. Rest between each set and min cues and demo. Upon return to room, pt's husband arrived and NT to take vitals thus OT handed off pt with call bell and needs in place.  band   Therapy Documentation Precautions:  Precautions Precautions: Fall Precaution Comments: watch vitals (orthostatic & hypoxic earlier in admission) Restrictions Weight Bearing Restrictions: Yes RLE Weight Bearing: Non weight bearing    Therapy/Group: Individual Therapy  Barnabas Lister 07/18/2021, 8:22 AM

## 2021-07-18 NOTE — IPOC Note (Signed)
Overall Plan of Care [facility]) Patient Details Name: Monique Ballard MRN: [id number] DOB: Dec 12, 1940  Admitting Diagnosis: Debility  Hospital Problems: Principal Problem:   Debility     Functional Problem List: Nursing Bladder, Edema, Endurance, Medication Management, Motor, Safety  PT Balance, Endurance, Safety  OT Balance, Cognition, Safety, Endurance, Motor  SLP    TR         Basic ADL's: OT Dressing, Bathing, Grooming, Toileting     Advanced  ADL's: OT Light Housekeeping, Simple Meal Preparation     Transfers: PT Bed Mobility, Bed to Chair, Car, Manufacturing systems engineer, Metallurgist: PT Ambulation, Emergency planning/management officer, Stairs     Additional Impairments: OT None  SLP        TR      Anticipated Outcomes Item Anticipated Outcome  Self Feeding    Swallowing      Basic self-care  CGA  Toileting  CGA   Bathroom Transfers Supervision  Bowel/Bladder  supervision  Transfers  mod I  Locomotion  mod I limited HH ambulation  Communication     Cognition     Pain  < 3  Safety/Judgment  supervision   Therapy Plan: PT Intensity: Minimum of 1-2 x/day ,45 to 90 minutes PT Frequency: 5 out of 7 days PT Duration Estimated Length of Stay: 12-14 days OT Intensity: Minimum of 1-2 x/day, 45 to 90 minutes OT Frequency: 5 out of 7 days OT Duration/Estimated Length of Stay: 12-15 days     Team Interventions: Nursing Interventions Patient/Family Education, Bladder Management, Disease Management/Prevention, Medication Management, Discharge Planning  PT interventions Ambulation/gait training, Discharge planning, Functional mobility training, Therapeutic Activities, Psychosocial support, Balance/vestibular training, Disease management/prevention, Therapeutic Exercise, Wheelchair propulsion/positioning, Cognitive remediation/compensation, DME/adaptive equipment instruction, Pain management, UE/LE Strength taining/ROM, Patient/family education, Stair training   OT Interventions Training and development officer, Community reintegration, Neuromuscular re-education, Patient/family education, Self Care/advanced ADL retraining, UE/LE Coordination activities, DME/adaptive equipment instruction, Functional mobility training, Discharge planning, Therapeutic Activities, UE/LE Strength taining/ROM  SLP Interventions    TR Interventions    SW/CM Interventions Discharge Planning, Psychosocial Support, Patient/Family Education   Barriers to Discharge MD  Medical stability, Home enviroment access/loayout, Incontinence, Neurogenic bowel and bladder, Wound care, Weight, and Weight bearing restrictions  Nursing Decreased caregiver support, Home environment access/layout, Incontinence, Lack of/limited family support, Weight bearing restrictions 1 level, 2 steps, no rails. Spouse can provide min assist to supervision, 24/7.  PT Inaccessible home environment, Decreased caregiver support, Weight bearing restrictions husband w/limited ability to provide physical assist, bathroom not wc accessibile, NWB  OT Weight bearing restrictions    SLP      SW       Team Discharge Planning: Destination: PT-Home ,OT- Home , SLP-  Projected Follow-up: PT-Home health PT, 24 hour supervision/assistance, OT-  Home health OT, SLP-  Projected Equipment Needs: PT-To be determined, OT- Tub/shower seat, 3 in 1 bedside comode, SLP-  Equipment Details: PT-has RW, rollator, OT-  Patient/family involved in discharge planning: PT- Patient, Family Midwife (daughter),  OT-Patient, Family Midwife, SLP-   MD ELOS: 12-15 days Medical Rehab Prognosis:  Good Assessment: The patient has been admitted for CIR therapies with the diagnosis of debility due to PFO surgery and NWB on RLE due to fx. The team will be addressing functional mobility, strength, stamina, balance, safety, adaptive techniques and equipment, self-care, bowel and bladder mgt, patient and caregiver education, . Goals  have been set at [facility] to supervision. Anticipated discharge destination is home.  See Team Conference Notes for weekly updates to the plan of care

## 2021-07-18 NOTE — Progress Notes (Signed)
PROGRESS NOTE   Subjective/Complaints:  Pt reports needed cath last night for retention.   Also reports her BP is better, but HR is low 100s.  No dizziness with low BP yesterday.   Ate 90+ % tray this Am- denies any issues.    ROS:  Pt denies SOB, abd pain, CP, N/V/C/D, and vision changes  Objective:   No results found.  Recent Labs    07/17/21 0630 07/18/21 0541  WBC 11.2* 10.8*  HGB 11.8* 11.5*  HCT 36.0 33.2*  PLT 310 285   Recent Labs    07/17/21 0630 07/18/21 0541  NA 137 138  K 4.6 3.9  CL 108 109  CO2 25 23  GLUCOSE 99 100*  BUN 7* 6*  CREATININE 0.68 0.61  CALCIUM 8.9 8.7*    Intake/Output Summary (Last 24 hours) at 07/18/2021 0824 Last data filed at 07/18/2021 0735 Gross per 24 hour  Intake 720 ml  Output 900 ml  Net -180 ml        Physical Exam: Vital Signs Blood pressure 122/72, pulse 84, temperature (!) 97.5 F (36.4 C), resp. rate 14, height '5\' 3"'$  (1.6 m), weight 56.5 kg, last menstrual period 01/22/1992, SpO2 97 %.    General: awake, alert, appropriate, says she's drinking; sitting up in bed- ate 90+% of tray; NAD HENT: conjugate gaze; oropharynx moist CV: mildly tachycardic rate; no JVD Pulmonary: CTA B/L; no W/R/R- good air movement GI: soft, NT, ND, (+)BS Psychiatric: appropriate- but flat Neurological: alert  Musculoskeletal:     Comments: RLE with CAM boot/ splint bruising at right toes - entire foot bruised on R Skin:    General: Skin is warm and dry.  Neurological:     Mental Status: She is alert and oriented to person, place, and time.     Sensory: No sensory deficit.     Motor: Weakness present.     Gait: Gait abnormal.     Comments: 5/5 in BUE 4/5 B HF and KE, L DF PF, RLE in splint   Sensation intact to LT   Assessment/Plan: 1. Functional deficits which require 3+ hours per day of interdisciplinary therapy in a comprehensive inpatient rehab  setting. Physiatrist is providing close team supervision and 24 hour management of active medical problems listed below. Physiatrist and rehab team continue to assess barriers to discharge/monitor patient progress toward functional and medical goals  Care Tool:  Bathing    Body parts bathed by patient: Right arm, Left arm, Chest, Abdomen, Front perineal area, Buttocks, Right upper leg, Left upper leg, Left lower leg, Face     Body parts n/a: Right lower leg   Bathing assist Assist Level: Moderate Assistance - Patient 50 - 74%     Upper Body Dressing/Undressing Upper body dressing   What is the patient wearing?: Pull over shirt, Bra    Upper body assist Assist Level: Supervision/Verbal cueing    Lower Body Dressing/Undressing Lower body dressing      What is the patient wearing?: Underwear/pull up, Pants     Lower body assist Assist for lower body dressing: Moderate Assistance - Patient 50 - 74%     Toileting Toileting  Toileting assist Assist for toileting: Maximal Assistance - Patient 25 - 49%     Transfers Chair/bed transfer  Transfers assist     Chair/bed transfer assist level: Dependent - mechanical lift     Locomotion Ambulation   Ambulation assist      Assist level: Moderate Assistance - Patient 50 - 74% Assistive device: Parallel bars Max distance: 4   Walk 10 feet activity   Assist     Assist level: Maximal Assistance - Patient 25 - 49% Assistive device: Parallel bars   Walk 50 feet activity   Assist Walk 50 feet with 2 turns activity did not occur: Safety/medical concerns         Walk 150 feet activity   Assist Walk 150 feet activity did not occur: Safety/medical concerns         Walk 10 feet on uneven surface  activity   Assist Walk 10 feet on uneven surfaces activity did not occur: Safety/medical concerns         Wheelchair     Assist Is the patient using a wheelchair?: Yes Type of Wheelchair: Manual     Wheelchair assist level: Minimal Assistance - Patient > 75% Max wheelchair distance: 120'    Wheelchair 50 feet with 2 turns activity    Assist        Assist Level: Minimal Assistance - Patient > 75%   Wheelchair 150 feet activity     Assist      Assist Level: Maximal Assistance - Patient 25 - 49%   Blood pressure 122/72, pulse 84, temperature (!) 97.5 F (36.4 C), resp. rate 14, height '5\' 3"'$  (1.6 m), weight 56.5 kg, last menstrual period 01/22/1992, SpO2 97 %.  Medical Problem List and Plan: 1. Functional deficits secondary to debility after PFO closure with hx recent R Fibular fx NWB x 6wk             -patient may  shower             -ELOS/Goals: 7-10d  Sup   Con't CIR- PT and OT- NWB on RLE x6 weeks  D/c 07/27/21  Con't CIR- PT and OT 2.  Antithrombotics: -DVT/anticoagulation:  Pharmaceutical: Lovenox             -antiplatelet therapy: ASA and Clopidigrel   3. Pain Management: tylenol;tramadol  6/26- no pain as long as NWB maintained- con't regimen  6/27- pt didn't want any pain meds added to regimen 4. Mood/Sleep: trazodone             -antipsychotic agents: NA 5. Neuropsych/cognition: This patient is  capable of making decisions on her own behalf. 6. Skin/Wound Care: [facility] for MASD  6/26- severe per nursing- wasn't able to assess- if doesn't improve, will give a few days of Diflucan 7. Fluids/Electrolytes/Nutrition: BMET in am , monitor K+  6/26- K+ 3.4- will give 40 mEq x1 and recheck in AM  6/28- K+ 3.9- was 4.6 yesterday-  8.  PFO closed per cardiology Dr Einar Gip 9.  R fibular fx in [place], NWB x  6 wks , f/u [facility] after DC 10.  Sinus tach on BB 11.  Polycythemia CBC in am  12.  UTI >100K Pseudomonas A, Cefepime IV per pharm  6/26- Dr Dwyane Dee suggested switching to Cipro 500 mg BID x 5 days- have done so and stopped Cefepime- will recheck labs in AM  6/27- WBC bounced up very slightly to 11.2 from 10.7- will recheck again in Am to  make sure  heading in right direction  6/28- since WBC 10.8- afebrile- will con't Cipro and monitor clinically for Sx's- did need in/out cath x1 last night- having some frequency still. Will do 7 days since still strongly (+) U/A 13. Hx ETOH use - none currently- cessation education  14. Hypotension  6/27- BP 80/52 this AM- even with recheck- sitting up- will order TEDs- advised pt on drinking enough fluids- at least 6-8 cups water/day- she admits hasn't been drinking enough- held B Blocker this AM  6/28 - Lopressor held again last night- BP 791T-056P systolic- depending on BP prior to lorpessor dose, might hold again- d/w nursing.   I spent a total of 52  minutes on total care today- >50% coordination of care- due to d/w PA and nursing about strong U/A result still for UTI also d/w PA about hypotension which has improved Also d/w nursing. Also did IPOC  LOS: 3 days A FACE TO FACE EVALUATION WAS PERFORMED  Thirza Pellicano 07/18/2021, 8:24 AM

## 2021-07-18 NOTE — Progress Notes (Signed)
Physical Therapy Session Note  Patient Details  Name: Monique Ballard MRN: [id number] Date of Birth: 01/02/1941  Today's Date: 07/18/2021 PT Individual Time: 1300-1330 PT Individual Time Calculation (min): 30 min   Short Term Goals: Week 1:  PT Short Term Goal 1 (Week 1): Pt will perform bed to wc to bed w/supervision via lateral scoot PT Short Term Goal 2 (Week 1): Pt will perform sit to stand w/RW from standard height w/min to light mod assist maintaining NWB RLE PT Short Term Goal 3 (Week 1): Pt will ambulate 66f w/LRAD and min assist, [place] to [place] PT Short Term Goal 4 (Week 1): pt will propel wc [id number]/supervision  Skilled Therapeutic Interventions/Progress Updates:    pt received in bed and agreeable to therapy. No complaint of pain. Supine>sit with supervision. Squat pivot EOB<>w/c<>outdoor bench. Initially with mod A fading to min A with cueing and repetition. Ntoed poor carryover from previous session. Pt propelled w/c to AHEC entrance and over unlevel outdoor surfaces. Pt needs frequent cueing for efficient push strokes and navigation. Pt performed LAQ to fatigue with RLE for strength and endurance. Pt then transported back to room and returned to EOB before requesting to use bathroom. Pt handed off to NT for toileting.   Therapy Documentation Precautions:  Precautions Precautions: Fall Precaution Comments: watch vitals (orthostatic & hypoxic earlier in admission) Restrictions Weight Bearing Restrictions: Yes RLE Weight Bearing: Non weight bearing General:     Therapy/Group: Individual Therapy  OlMickel Fuchs/28/2023, 3:47 PM

## 2021-07-18 NOTE — Progress Notes (Signed)
Physical Therapy Session Note  Patient Details  Name: Monique Ballard MRN: [id number] Date of Birth: 1940/02/01  Today's Date: 07/18/2021 PT Individual Time: 0947-0962 PT Individual Time Calculation (min): 59 min   Short Term Goals: Week 1:  PT Short Term Goal 1 (Week 1): Pt will perform bed to wc to bed w/supervision via lateral scoot PT Short Term Goal 2 (Week 1): Pt will perform sit to stand w/RW from standard height w/min to light mod assist maintaining NWB RLE PT Short Term Goal 3 (Week 1): Pt will ambulate 77f w/LRAD and min assist, [place] to [place] PT Short Term Goal 4 (Week 1): pt will propel wc [id number]/supervision  Skilled Therapeutic Interventions/Progress Updates:    Pt seated in w/c on arrival and agreeable to therapy. No complaint of pain. Pt propelled w/c with BUE to/from day room with Supervision, cues for longer push stroke with RUE. Squat pivot training w/c<>mat R and L x 2. Cues and education on head hips relationship, w/c parts management, technique, placement, safety. Min A progressing to CGBellHalf squat from sitting to prep for improved hip clearance with squat pivots and global strength, 3 x 6. Cueing for heads hip relationship and anterior weight shift. Sit to stand to RW with min A and RLE propped on airex to promote NWB. Pt then performed standing R hip flexion 3 x 10 with seated rest breaks d/t LLE fatigue. Cued for head/hip and anterior weight shift with Sit to stand for improved efficiency. Provided education on DOMS and to let therapists know if this develops.  Pt then donned w/c leg rests with assist and cueing. Pt propelled w/c with BUE with supervision and cueing for efficient push strokes and navigation in tight spaces of pt room. Pt performed squat pivot to R side with CGA back to bed. Sit>supine with supervision, was left with all needs in reach and alarm active.    Therapy Documentation Precautions:  Precautions Precautions: Fall Precaution  Comments: watch vitals (orthostatic & hypoxic earlier in admission) Restrictions Weight Bearing Restrictions: Yes RLE Weight Bearing: Non weight bearing General:       Therapy/Group: Individual Therapy  OlMickel Fuchs/28/2023, 10:29 AM

## 2021-07-19 NOTE — Progress Notes (Signed)
PROGRESS NOTE   Subjective/Complaints:  Pt reports doing well No Signs /Sx's of UTI anymore- no frequency and hasn't required an in/out caths again.  Not hungry this AM- ate >25% of tray, but said "it s a one off".   LBM x2 this AM small and large.  ROS:  Pt denies SOB, abd pain, CP, N/V/C/D, and vision changes   Objective:   No results found.  Recent Labs    07/17/21 0630 07/18/21 0541  WBC 11.2* 10.8*  HGB 11.8* 11.5*  HCT 36.0 33.2*  PLT 310 285   Recent Labs    07/17/21 0630 07/18/21 0541  NA 137 138  K 4.6 3.9  CL 108 109  CO2 25 23  GLUCOSE 99 100*  BUN 7* 6*  CREATININE 0.68 0.61  CALCIUM 8.9 8.7*    Intake/Output Summary (Last 24 hours) at 07/19/2021 0834 Last data filed at 07/19/2021 0730 Gross per 24 hour  Intake 836 ml  Output --  Net 836 ml        Physical Exam: Vital Signs Blood pressure 98/63, pulse 76, temperature 97.8 F (36.6 C), temperature source Oral, resp. rate 16, height '5\' 3"'$  (1.6 m), weight 56.5 kg, last menstrual period 01/22/1992, SpO2 97 %.     General: awake, alert, appropriate, sitting up in bed watching TV: NAD HENT: conjugate gaze; oropharynx moist CV: regular rate; no JVD Pulmonary: CTA B/L; no W/R/R- good air movement GI: soft, NT, ND, (+)BS Psychiatric: appropriate- less impulsive Neurological: alert Musculoskeletal:     Comments: RLE with CAM boot/ splint bruising at right toes - entire foot bruised on R- evolving bruises Skin:    General: Skin is warm and dry.  Neurological:     Mental Status: She is alert and oriented to person, place, and time.     Sensory: No sensory deficit.     Motor: Weakness present.     Gait: Gait abnormal.     Comments: 5/5 in BUE 4/5 B HF and KE, L DF PF, RLE in splint   Sensation intact to LT   Assessment/Plan: 1. Functional deficits which require 3+ hours per day of interdisciplinary therapy in a comprehensive  inpatient rehab setting. Physiatrist is providing close team supervision and 24 hour management of active medical problems listed below. Physiatrist and rehab team continue to assess barriers to discharge/monitor patient progress toward functional and medical goals  Care Tool:  Bathing    Body parts bathed by patient: Right arm, Left arm, Chest, Abdomen, Front perineal area, Buttocks, Right upper leg, Left upper leg, Left lower leg, Face   Body parts bathed by helper: Right lower leg Body parts n/a: Right lower leg   Bathing assist Assist Level: Moderate Assistance - Patient 50 - 74%     Upper Body Dressing/Undressing Upper body dressing   What is the patient wearing?: Pull over shirt, Bra    Upper body assist Assist Level: Supervision/Verbal cueing    Lower Body Dressing/Undressing Lower body dressing      What is the patient wearing?: Underwear/pull up, Pants     Lower body assist Assist for lower body dressing: Moderate Assistance - Patient 50 - 74%  Toileting Toileting    Toileting assist Assist for toileting: Moderate Assistance - Patient 50 - 74%     Transfers Chair/bed transfer  Transfers assist     Chair/bed transfer assist level: Dependent - mechanical lift     Locomotion Ambulation   Ambulation assist      Assist level: Moderate Assistance - Patient 50 - 74% Assistive device: Parallel bars Max distance: 4   Walk 10 feet activity   Assist  Walk 10 feet activity did not occur: Safety/medical concerns (per PT eval note, ambulation only in parallel bars 4 feet.)        Walk 50 feet activity   Assist Walk 50 feet with 2 turns activity did not occur: Safety/medical concerns         Walk 150 feet activity   Assist Walk 150 feet activity did not occur: Safety/medical concerns         Walk 10 feet on uneven surface  activity   Assist Walk 10 feet on uneven surfaces activity did not occur: Safety/medical concerns          Wheelchair     Assist Is the patient using a wheelchair?: Yes Type of Wheelchair: Manual    Wheelchair assist level: Minimal Assistance - Patient > 75% Max wheelchair distance: 120'    Wheelchair 50 feet with 2 turns activity    Assist        Assist Level: Minimal Assistance - Patient > 75%   Wheelchair 150 feet activity     Assist      Assist Level: Maximal Assistance - Patient 25 - 49%   Blood pressure 98/63, pulse 76, temperature 97.8 F (36.6 C), temperature source Oral, resp. rate 16, height '5\' 3"'$  (1.6 m), weight 56.5 kg, last menstrual period 01/22/1992, SpO2 97 %.  Medical Problem List and Plan: 1. Functional deficits secondary to debility after PFO closure with hx recent R Fibular fx NWB x 6wk             -patient may  shower             -ELOS/Goals: 7-10d  Sup   Con't CIR- PT and OT- NWB on RLE x6 weeks  D/c 07/27/21  Con't CIR- PT and OT 2.  Antithrombotics: -DVT/anticoagulation:  Pharmaceutical: Lovenox             -antiplatelet therapy: ASA and Clopidigrel   3. Pain Management: tylenol;tramadol  6/26- no pain as long as NWB maintained- con't regimen  6/27- pt didn't want any pain meds added to regimen 4. Mood/Sleep: trazodone             -antipsychotic agents: NA 5. Neuropsych/cognition: This patient is  capable of making decisions on her own behalf. 6. Skin/Wound Care: [facility] for MASD  6/26- severe per nursing- wasn't able to assess- if doesn't improve, will give a few days of Diflucan  6/29- doing a little better- con't regimen 7. Fluids/Electrolytes/Nutrition: BMET in am , monitor K+  6/26- K+ 3.4- will give 40 mEq x1 and recheck in AM  6/28- K+ 3.9- was 4.6 yesterday-  8.  PFO closed per cardiology Dr Einar Gip 9.  R fibular fx in [place], NWB x  6 wks , f/u [facility] after DC 10.  Sinus tach on BB 11.  Polycythemia CBC in am  12.  UTI >100K Pseudomonas A, Cefepime IV per pharm  6/26- Dr Dwyane Dee suggested switching to Cipro 500 mg BID x  5 days- have done so  and stopped Cefepime- will recheck labs in AM  6/27- WBC bounced up very slightly to 11.2 from 10.7- will recheck again in Am to make sure heading in right direction  6/28- since WBC 10.8- afebrile- will con't Cipro and monitor clinically for Sx's- did need in/out cath x1 last night- having some frequency still. Will do 7 days since still strongly (+) U/A  6/29- no Sx's of UTI anymore and no cath required-  13. Hx ETOH use - none currently- cessation education  14. Hypotension  6/27- BP 80/52 this AM- even with recheck- sitting up- will order TEDs- advised pt on drinking enough fluids- at least 6-8 cups water/day- she admits hasn't been drinking enough- held B Blocker this AM  6/28 - Lopressor held again last night- BP 403F-543K systolic- depending on BP prior to lorpessor dose, might hold again- d/w nursing.   6/29- BP 98/63 this AM- but up to 146/79 this Am as well.        LOS: 4 days A FACE TO FACE EVALUATION WAS PERFORMED  Keelin Sheridan 07/19/2021, 8:34 AM

## 2021-07-19 NOTE — Progress Notes (Signed)
Occupational Therapy Session Note  Patient Details  Name: Monique Ballard MRN: [id number] Date of Birth: [birth year]  Today's Date: 07/19/2021 OT Individual Time: 1115-1200 1st visit; 1330-1445  OT Individual Time Calculation (min): 45 min 75 min    Short Term Goals: Week 1:  OT Short Term Goal 1 (Week 1): Pt to be Mod A for LB bathing/dressing with LRD OT Short Term Goal 2 (Week 1): Pt to be Min A for functional transfers with LRD OT Short Term Goal 3 (Week 1): Pt to be Min A for toileting tasks OT Short Term Goal 4 (Week 1): Pt will maintain NWB status with minimal cuing OT Short Term Goal 5 (Week 1): Pt will be Mod i for UB bathing/dressing  Skilled Therapeutic Interventions/Progress Updates:  1st session: Pt seen for am OT session with focus on toileting, functional transfer safety and general strength training. OT checked BP due to lower pressure yesterday am. Now 128/78 HR 78 seated at EOB. Pt transferred bed to w/c to 3 in 1 over toilet and back to w/c with CGA-min A going to L side and min-mod a going toward R side due to NWB prec with modified squat pivots. Pt required mod a for LB garment management and min a for peri hygiene. Pt then able to self propel with increased time and 2 rests each way to day room gym ~40 ft x 2. Transfer to and from mat with CGA to L side. Supine on wedge with min A for lower abd/core and B UE weighted ball (2 lbs) for abd crunches, lateral trunk rotation Bly, chest press, sh flex/ext 10 reps x 2 sets with rests. Once back in room, pt set up in front of tray table for lunch with call button in reach and chair alarm engaged.   2nd session: Pt seen for pm OT session to address shower and self care re-training. Pt reports no pain at start and throughout session. Pt in bed and moved from supine to sit and EOB sitting with CGA. Transfer to w/c via modified SPT to L side with CGA and min cues with inproved carryover of NWB R LE. Transferred to and from shower bench  with bar with min A and mod cues and OT covered CAM boot and IV with moisture barrier for infection prevention. Pt able to bathe UB with set up and CGA and LB with mod-max A with lateral leans for buttocks and peri area. Dried with mod A overall. Dressing sink side with set up and close S for bra, pullover shirt and mod-max A for LB garments and incontinence brief. Pt needed medication to buttocks and peri area by SN and OT assisted with transfer back to bed with min A due to fatigue. OT left pt in the care of nursing.   Therapy Documentation Precautions:  Precautions Precautions: Fall Precaution Comments: watch vitals (orthostatic & hypoxic earlier in admission) Restrictions Weight Bearing Restrictions: Yes RLE Weight Bearing: Non weight bearing    Therapy/Group: Individual Therapy  Monique Ballard 07/19/2021, 7:58 AM

## 2021-07-19 NOTE — Progress Notes (Signed)
Physical Therapy Session Note  Patient Details  Name: Monique Ballard MRN: [id number] Date of Birth: 1940-02-04  Today's Date: 07/19/2021 PT Individual Time: 0805-0918 PT Individual Time Calculation (min): 73 min   Short Term Goals: Week 1:  PT Short Term Goal 1 (Week 1): Pt will perform bed to wc to bed w/supervision via lateral scoot PT Short Term Goal 2 (Week 1): Pt will perform sit to stand w/RW from standard height w/min to light mod assist maintaining NWB RLE PT Short Term Goal 3 (Week 1): Pt will ambulate 70f w/LRAD and min assist, adhering to [place] PT Short Term Goal 4 (Week 1): pt will propel wc [id number]/supervision  Skilled Therapeutic Interventions/Progress Updates: Pt presented in bed agreeable to therapy. Pt denies pain at start of session. PTA doffed boot and pt was able to thread pants with supervision. PTA re-donned boot and pt transferred to EOB with supervision and use of bed features. Pt was able to perform lateral leans to pull pants over hips with minA. Pt donned shirt mod I (already had bra on). Pt then performed squat pivot to L with minA and PTA placing pt's RLE on foot to attempt to maintain NWB precautions. Pt transported to sink and performed oral hygiene mod I. Once completed pt propelled to day room with supervision and participated in block practice squat pivot transfers w/c to/from high/low mat. Pt was able to transfer to R with CGA and was able to almost fully maintain NWB on RLE. Pt required minA when transferring to L and required more cues to maintain NWB as pt continually attempted to place RLE on floor. Pt then participated in seated therex as follows: LAQ LLE 3# cuff 2 x 10, hip abd 3# cuff 2 x 10, seated hip flexion bilatearlly 2 x 10, hamstring pulls with green theraband 2 x 10 bilaterally and hip er with green theraband 2 x 10. Pt able to complete all tasks with minimal cues. Attempted Sit to stand from elevated mat and PTA blocking RLE to decrease attempt  at placing weight through RLE. Pt was able to perform successfully with CGA from elevated mat. Pt performed x 3 with PTA blocking RLE then pt was able to perform x 2 additional without PTA blocking RLE and pt was able to maintain NWB! Pt performed squat pivot transfer back to w/c with PTA holding RLE with CGA overall. Pt propelled back room supervision and performed squat pivot transfer in same manner to bed. Performed sit to supine with supervision and left in bed with bed alarm on, call bell within reach and current needs met.      Therapy Documentation Precautions:  Precautions Precautions: Fall Precaution Comments: watch vitals (orthostatic & hypoxic earlier in admission) Restrictions Weight Bearing Restrictions: Yes RLE Weight Bearing: Non weight bearing General:   Vital Signs: Therapy Vitals Temp: 98.9 F (37.2 C) Pulse Rate: 83 Resp: 20 BP: 92/64 Patient Position (if appropriate): Lying Oxygen Therapy SpO2: 95 % O2 Device: Room Air Pain:   Mobility:   Locomotion :    Trunk/Postural Assessment :    Balance:   Exercises:   Other Treatments:      Therapy/Group: Individual Therapy  Dwight Adamczak 07/19/2021, 3:31 PM

## 2021-07-20 NOTE — Progress Notes (Signed)
Physical Therapy Session Note  Patient Details  Name: Monique Ballard MRN: [id number] Date of Birth: 02/13/1940  Today's Date: 07/20/2021 PT Individual Time: 7035-0093 PT Individual Time Calculation (min): 30 min   Short Term Goals: Week 1:  PT Short Term Goal 1 (Week 1): Pt will perform bed to wc to bed w/supervision via lateral scoot PT Short Term Goal 2 (Week 1): Pt will perform sit to stand w/RW from standard height w/min to light mod assist maintaining NWB RLE PT Short Term Goal 3 (Week 1): Pt will ambulate 58f w/LRAD and min assist, adhering to [place] PT Short Term Goal 4 (Week 1): pt will propel wc 1042fw/supervision  Skilled Therapeutic Interventions/Progress Updates:    Pt seated in w/c on arrival and agreeable to therapy. No complaint of pain. NT present on arrival, noting BP low, 84/61 (70). Deferred standing activity in favor of seated LE strengthening d/t low BP.  Pt performed the following with 2.5 lbs ankle weights, seated rest break between activities, cues for slower eccentric motion throughout: -LAQ, 3 x 12 -Seated alternating marching, 3 x 20 -Hip abduction squeezes, 2 x 10  Pt remained in w/c, and was left with all needs in reach and alarm active.    Therapy Documentation Precautions:  Precautions Precautions: Fall Precaution Comments: watch vitals (orthostatic & hypoxic earlier in admission) Restrictions Weight Bearing Restrictions: Yes RLE Weight Bearing: Non weight bearing General:       Therapy/Group: Individual Therapy  OlMickel Fuchs/30/2023, 2:14 PM

## 2021-07-20 NOTE — Progress Notes (Signed)
Occupational Therapy Session Note  Patient Details  Name: Monique Ballard MRN: [id number] Date of Birth: [birth year]  Today's Date: 07/20/2021 OT Individual Time: 0800-0915 1st session, 1300-1400 2nd session  OT Individual Time Calculation (min): 75 min , 60 min    Short Term Goals: Week 1:  OT Short Term Goal 1 (Week 1): Pt to be Mod A for LB bathing/dressing with LRD OT Short Term Goal 2 (Week 1): Pt to be Min A for functional transfers with LRD OT Short Term Goal 3 (Week 1): Pt to be Min A for toileting tasks OT Short Term Goal 4 (Week 1): Pt will maintain NWB status with minimal cuing OT Short Term Goal 5 (Week 1): Pt will be Mod i for UB bathing/dressing  Skilled Therapeutic Interventions/Progress Updates:  1st session:Pt seen for am self care re-training session with AE and DME with focus on R LE NWB precautions compliance training. min A to toilet with drop arm 3 in 1 over top modified squat. Pt continues to have some difficulty with overall maintaining R LE NWB therefore lateral weight shifts highly effective for joint protection and compliance during peri hygiene. Pt was able to self propel to sink side ~15 ft with mod cues. Sponge bathing seated sink side with set up and S for UB and mod-max A for LB except R lower leg with OT assisting due to CAM boot management. Incontinence brief with max A. Pt required close S for pull on shirt and bra and mod A LB self care with seated type push ups for LB garment management progressing to one legged stance on L LE with R LE held in knee flexion to avoid weight bearing using RW for UE support. Grooming including hair and oral care with set up seated. Pt was able to perform 12 reps of yellow tband for scap retraction/protraction, sh flex/ex, horizontal abd/add and elbow flex/ext. Pt reported no pain and OT completed session with pt in w/c with chair alarm engaged and call button and needs within reach.   2nd session:Pt seen for 2nd OT session this pm  and transport pt via w/c for time management purposes to demo apartment kitchen therapy space. Pt's vitals monitored during seated then while supported standing as follows: BP 92/70 seated 98/70 standing HR between 82-96 throughout visit with standing and UE reach. Stood 6 intervals of 45 sec- 1 min max with cues for NWB both verbal, tactile and visual usign RW for UE support. Pt then transported to therap gym and was able to perform 2 lb hand weights unilaterally sequenced for sh flex/ext, elbow flex/ex, lateral trunk flex, and core forward reaching all performed in sitting with rests breaks between intervals. No pain reported throughout session. Pt was then able to self prope ~ 45 ft with increased time and effort and 2 rests back to room. OT left w/c level with all needs within reach including nurse call bell and chair alarm engaged then handed off to PT with brief care coordination re: BP and standing task response.   Therapy Documentation Precautions:  Precautions Precautions: Fall Precaution Comments: watch vitals (orthostatic & hypoxic earlier in admission) Restrictions Weight Bearing Restrictions: Yes RLE Weight Bearing: Non weight bearing    Therapy/Group: Individual Therapy  Barnabas Lister 07/20/2021, 8:28 AM

## 2021-07-20 NOTE — Progress Notes (Signed)
Physical Therapy Session Note  Patient Details  Name: Monique Ballard MRN: [id number] Date of Birth: Jan 20, 1941  Today's Date: 07/20/2021 PT Individual Time: 1115-1200 PT Individual Time Calculation (min): 45 min   Short Term Goals: Week 1:  PT Short Term Goal 1 (Week 1): Pt will perform bed to wc to bed w/supervision via lateral scoot PT Short Term Goal 2 (Week 1): Pt will perform sit to stand w/RW from standard height w/min to light mod assist maintaining NWB RLE PT Short Term Goal 3 (Week 1): Pt will ambulate 41f w/LRAD and min assist, adhering to [place] PT Short Term Goal 4 (Week 1): pt will propel wc 1080fw/supervision  Skilled Therapeutic Interventions/Progress Updates:    Pt seated in w/c on arrival and agreeable to therapy. No complaint of pain. Pt propelled w/c with BUE x 80 ft to/from dayroom with supervision and cues for w/c brakes. Pt then participated in Sit to stand practice with RW and R foot propped on airex to keep foot in front and cue for NWB. Pt performed 3x5 with CGA-min A with fatigue and cues for hand placement throughout. Pt did best with pushing with BUE from arm rests to stand and reaching R arm back to sit. Progressed to no airex pad, pt still placing foot momentarily but not pushing through to stand. As light as CGA, but largely min A for sit<>stand. Pt performed x 6 3 way hip for L single leg stance stability and RLE strength, but fatigued quickly. Pt then propelled back to room in the same manner. Pt remained in chair and was left with all needs in reach and alarm active.   Therapy Documentation Precautions:  Precautions Precautions: Fall Precaution Comments: watch vitals (orthostatic & hypoxic earlier in admission) Restrictions Weight Bearing Restrictions: Yes RLE Weight Bearing: Non weight bearing General:       Therapy/Group: Individual Therapy  OlMickel Fuchs/30/2023, 11:26 AM

## 2021-07-20 NOTE — Progress Notes (Signed)
PROGRESS NOTE   Subjective/Complaints:  Pt's reports LBM yesterday.  Therapy going well.   ROS:  Pt denies SOB, abd pain, CP, N/V/C/D, and vision changes   Objective:   No results found.  Recent Labs    07/18/21 0541  WBC 10.8*  HGB 11.5*  HCT 33.2*  PLT 285   Recent Labs    07/18/21 0541  NA 138  K 3.9  CL 109  CO2 23  GLUCOSE 100*  BUN 6*  CREATININE 0.61  CALCIUM 8.7*    Intake/Output Summary (Last 24 hours) at 07/20/2021 0815 Last data filed at 07/19/2021 2000 Gross per 24 hour  Intake 660 ml  Output --  Net 660 ml        Physical Exam: Vital Signs Blood pressure 100/65, pulse 82, temperature 98.1 F (36.7 C), temperature source Oral, resp. rate 17, height '5\' 3"'$  (1.6 m), weight 56.5 kg, last menstrual period 01/22/1992, SpO2 98 %.      General: awake, alert, appropriate, NAD HENT: conjugate gaze; oropharynx moist CV: regular rate; no JVD Pulmonary: CTA B/L; no W/R/R- good air movement GI: soft, NT, ND, (+)BS Psychiatric: appropriate Neurological: Ox3 Musculoskeletal:     Comments: RLE with CAM boot/ splint bruising at right toes - entire foot bruised on R- evolving bruises Skin:    General: Skin is warm and dry.  Neurological:     Mental Status: She is alert and oriented to person, place, and time.     Sensory: No sensory deficit.     Motor: Weakness present.     Gait: Gait abnormal.     Comments: 5/5 in BUE 4/5 B HF and KE, L DF PF, RLE in splint   Sensation intact to LT   Assessment/Plan: 1. Functional deficits which require 3+ hours per day of interdisciplinary therapy in a comprehensive inpatient rehab setting. Physiatrist is providing close team supervision and 24 hour management of active medical problems listed below. Physiatrist and rehab team continue to assess barriers to discharge/monitor patient progress toward functional and medical goals  Care Tool:  Bathing     Body parts bathed by patient: Right arm, Left arm, Chest, Abdomen, Front perineal area, Buttocks, Right upper leg, Left upper leg, Left lower leg, Face   Body parts bathed by helper: Right lower leg Body parts n/a: Right lower leg   Bathing assist Assist Level: Moderate Assistance - Patient 50 - 74%     Upper Body Dressing/Undressing Upper body dressing   What is the patient wearing?: Pull over shirt, Bra    Upper body assist Assist Level: Supervision/Verbal cueing    Lower Body Dressing/Undressing Lower body dressing      What is the patient wearing?: Underwear/pull up, Pants     Lower body assist Assist for lower body dressing: Moderate Assistance - Patient 50 - 74%     Toileting Toileting    Toileting assist Assist for toileting: Moderate Assistance - Patient 50 - 74%     Transfers Chair/bed transfer  Transfers assist     Chair/bed transfer assist level: Dependent - mechanical lift     Locomotion Ambulation   Ambulation assist  Assist level: Moderate Assistance - Patient 50 - 74% Assistive device: Parallel bars Max distance: 4   Walk 10 feet activity   Assist  Walk 10 feet activity did not occur: Safety/medical concerns (per PT eval note, ambulation only in parallel bars 4 feet.)        Walk 50 feet activity   Assist Walk 50 feet with 2 turns activity did not occur: Safety/medical concerns         Walk 150 feet activity   Assist Walk 150 feet activity did not occur: Safety/medical concerns         Walk 10 feet on uneven surface  activity   Assist Walk 10 feet on uneven surfaces activity did not occur: Safety/medical concerns         Wheelchair     Assist Is the patient using a wheelchair?: Yes Type of Wheelchair: Manual    Wheelchair assist level: Minimal Assistance - Patient > 75% Max wheelchair distance: 120'    Wheelchair 50 feet with 2 turns activity    Assist        Assist Level: Minimal  Assistance - Patient > 75%   Wheelchair 150 feet activity     Assist      Assist Level: Maximal Assistance - Patient 25 - 49%   Blood pressure 100/65, pulse 82, temperature 98.1 F (36.7 C), temperature source Oral, resp. rate 17, height '5\' 3"'$  (1.6 m), weight 56.5 kg, last menstrual period 01/22/1992, SpO2 98 %.  Medical Problem List and Plan: 1. Functional deficits secondary to debility after PFO closure with hx recent R Fibular fx NWB x 6wk             -patient may  shower             -ELOS/Goals: 7-10d  Sup   Con't CIR- PT and OT- NWB on RLE x6 weeks  D/c 07/27/21  Con't CIR_ PT, OT  2.  Antithrombotics: -DVT/anticoagulation:  Pharmaceutical: Lovenox             -antiplatelet therapy: ASA and Clopidigrel   3. Pain Management: tylenol;tramadol  6/26- no pain as long as NWB maintained- con't regimen  6/27- pt didn't want any pain meds added to regimen 4. Mood/Sleep: trazodone             -antipsychotic agents: NA 5. Neuropsych/cognition: This patient is  capable of making decisions on her own behalf. 6. Skin/Wound Care: [facility] for MASD  6/26- severe per nursing- wasn't able to assess- if doesn't improve, will give a few days of Diflucan  6/29- doing a little better- con't regimen 7. Fluids/Electrolytes/Nutrition: BMET in am , monitor K+  6/26- K+ 3.4- will give 40 mEq x1 and recheck in AM  6/28- K+ 3.9- was 4.6 yesterday-  8.  PFO closed per cardiology Dr Einar Gip 9.  R fibular fx in [place], NWB x  6 wks , f/u [facility] after DC 10.  Sinus tach on BB 11.  Polycythemia CBC in am  12.  UTI >100K Pseudomonas A, Cefepime IV per pharm  6/26- Dr Dwyane Dee suggested switching to Cipro 500 mg BID x 5 days- have done so and stopped Cefepime- will recheck labs in AM  6/27- WBC bounced up very slightly to 11.2 from 10.7- will recheck again in Am to make sure heading in right direction  6/28- since WBC 10.8- afebrile- will con't Cipro and monitor clinically for Sx's- did need in/out cath  x1 last night- having some  frequency still. Will do 7 days since still strongly (+) U/A  6/29- no Sx's of UTI anymore and no cath required-  13. Hx ETOH use - none currently- cessation education  14. Hypotension  6/27- BP 80/52 this AM- even with recheck- sitting up- will order TEDs- advised pt on drinking enough fluids- at least 6-8 cups water/day- she admits hasn't been drinking enough- held B Blocker this AM  6/28 - Lopressor held again last night- BP 220U-542H systolic- depending on BP prior to lorpessor dose, might hold again- d/w nursing.   6/29- BP 98/63 this AM- but up to 146/79 this Am as well.   6/30- BP 100/65- no Sx's of hypotension- con't regimen        LOS: 5 days A FACE TO FACE EVALUATION WAS PERFORMED  Tynisa Vohs 07/20/2021, 8:15 AM

## 2021-07-21 LAB — URINE CULTURE

## 2021-07-21 MED ORDER — METOPROLOL TARTRATE 12.5 MG HALF TABLET
12.5000 mg | ORAL_TABLET | Freq: Two times a day (BID) | ORAL | Status: DC
  Administered 2021-07-23 – 2021-07-27 (×9): 12.5 mg via ORAL
  Filled 2021-07-21 (×12): qty 1

## 2021-07-21 NOTE — Progress Notes (Signed)
Occupational Therapy Session Note  Patient Details  Name: Monique Ballard MRN: [id number] Date of Birth: [birth year]  Today's Date: 07/21/2021 OT Individual Time: 7342-8768 OT Individual Time Calculation (min): 28 min    Short Term Goals: Week 1:  OT Short Term Goal 1 (Week 1): Pt to be Mod A for LB bathing/dressing with LRD OT Short Term Goal 2 (Week 1): Pt to be Min A for functional transfers with LRD OT Short Term Goal 3 (Week 1): Pt to be Min A for toileting tasks OT Short Term Goal 4 (Week 1): Pt will maintain NWB status with minimal cuing OT Short Term Goal 5 (Week 1): Pt will be Mod i for UB bathing/dressing  Skilled Therapeutic Interventions/Progress Updates: patient activities limited to low endurance functional activities due to low BPs this session (see below in vitals section).   Patient stated she did feel dizziness or badly.   However,  her therapies this session were completed slowly and gently seated and EOB for safety precaution.   At end of session, patient was assisted backto bed with call bell and phone within reach.   Will offer therapy again next scheduled date.   Otherwise, continue patient's plan of care.     Therapy Documentation Precautions:  Precautions Precautions: Fall Precaution Comments: watch vitals (orthostatic & hypoxic earlier in admission) Restrictions Weight Bearing Restrictions: Yes RLE Weight Bearing: Non weight bearing General: General OT Amount of Missed Time: 17 Minutes due to low BP when OOB or standing Vital Signs: 95/56 supine;  sitting EOB 95/72;   standing EOB = 66/55 and stading 3 minutes later=88/55   Therapy Vitals Temp: 98 F (36.7 C) Temp Source: Oral Pulse Rate: 70 Resp: 18 BP: (!) 86/62 Patient Position (if appropriate): Lying Oxygen Therapy SpO2: 96 % O2 Device: Room Air Pain:denied    Therapy/Group: Individual Therapy  Alfredia Ferguson [facility] 07/21/2021, 5:10 PM

## 2021-07-21 NOTE — Progress Notes (Signed)
Physical Therapy Session Note  Patient Details  Name: Monique Ballard MRN: [id number] Date of Birth: 28-Oct-1940  Today's Date: 07/21/2021 PT Individual Time: 0915-0955 PT Individual Time Calculation (min): 40 min   Short Term Goals: Week 1:  PT Short Term Goal 1 (Week 1): Pt will perform bed to wc to bed w/supervision via lateral scoot PT Short Term Goal 2 (Week 1): Pt will perform sit to stand w/RW from standard height w/min to light mod assist maintaining NWB RLE PT Short Term Goal 3 (Week 1): Pt will ambulate 73f w/LRAD and min assist, adhering to [place] PT Short Term Goal 4 (Week 1): pt will propel wc 1069fw/supervision  Skilled Therapeutic Interventions/Progress Updates:    Pt sleeping on arrival, but roused with verbal and tactile stimulus, agreeable to therapy. Pt with no pain, except when bearing weight through RLE. Supine>sit with supervision. Pt doffs PJ shirt and donned bra and pullover shirt with supervision. Assist to thread RLE over cam boot, pt pulled pants over hips in sitting. Squat pivot to w/c with min A, pt demoing mild impulsivity and bearing weight through foot. Pt then performs modified stand pivot to commode using bathroom grabbar. CGA for clothing management supervision hygiene. Pt washed hands and brushed teeth at w/c level. Pt propelled w/c with BUE  to/from day room for endurance and functional mobility. Supervision required for navigation and w/c parts. Instructional cueing required for setting up for transfer. Squat pivot training w/c<>mat x 3. Step by step cueing with pt still having difficulty keeping weight of RLE. At this time, pt reports need to use bathroom. Toileting again as above. Noted Continent bowel void, documented in flowsheet. Pt returns to bed with squat pivot transfer. Demoes carryover of previous training only with question cues ("where do your hands go?" Sit>supine with supervision, was left with all needs in reach and alarm active.    Therapy  Documentation Precautions:  Precautions Precautions: Fall Precaution Comments: watch vitals (orthostatic & hypoxic earlier in admission) Restrictions Weight Bearing Restrictions: Yes RLE Weight Bearing: Non weight bearing General:       Therapy/Group: Individual Therapy  OlMickel Fuchs/01/2021, 9:18 AM

## 2021-07-21 NOTE — Progress Notes (Signed)
PROGRESS NOTE   Subjective/Complaints:  Pt reports no issues LBM yesterday-  Wants to watch TV  ROS:  Pt denies SOB, abd pain, CP, N/V/C/D, and vision changes  Objective:   No results found.  No results for input(s): "WBC", "HGB", "HCT", "PLT" in the last 72 hours.  No results for input(s): "NA", "K", "CL", "CO2", "GLUCOSE", "BUN", "CREATININE", "CALCIUM" in the last 72 hours.   Intake/Output Summary (Last 24 hours) at 07/21/2021 1109 Last data filed at 07/21/2021 0948 Gross per 24 hour  Intake 297 ml  Output --  Net 297 ml        Physical Exam: Vital Signs Blood pressure (!) 145/85, pulse 69, temperature 97.8 F (36.6 C), temperature source Oral, resp. rate (!) 22, height '5\' 3"'$  (1.6 m), weight 56.5 kg, last menstrual period 01/22/1992, SpO2 97 %.       General: awake, alert, appropriate, sitting up watching TV; NAD HENT: conjugate gaze; oropharynx moist CV: regular rate; no JVD Pulmonary: CTA B/L; no W/R/R- good air movement GI: soft, NT, ND, (+)BS Psychiatric: appropriate Neurological: Ox3  Musculoskeletal:     Comments: RLE with CAM boot/ splint bruising at right toes - entire foot bruised on R- evolving bruises Skin:    General: Skin is warm and dry.  Neurological:     Mental Status: She is alert and oriented to person, place, and time.     Sensory: No sensory deficit.     Motor: Weakness present.     Gait: Gait abnormal.     Comments: 5/5 in BUE 4/5 B HF and KE, L DF PF, RLE in splint   Sensation intact to LT   Assessment/Plan: 1. Functional deficits which require 3+ hours per day of interdisciplinary therapy in a comprehensive inpatient rehab setting. Physiatrist is providing close team supervision and 24 hour management of active medical problems listed below. Physiatrist and rehab team continue to assess barriers to discharge/monitor patient progress toward functional and medical  goals  Care Tool:  Bathing    Body parts bathed by patient: Right arm, Left arm, Chest, Abdomen, Front perineal area, Buttocks, Right upper leg, Left upper leg, Left lower leg, Face   Body parts bathed by helper: Right lower leg Body parts n/a: Right lower leg   Bathing assist Assist Level: Moderate Assistance - Patient 50 - 74%     Upper Body Dressing/Undressing Upper body dressing   What is the patient wearing?: Pull over shirt, Bra    Upper body assist Assist Level: Supervision/Verbal cueing    Lower Body Dressing/Undressing Lower body dressing      What is the patient wearing?: Pants, Incontinence brief     Lower body assist Assist for lower body dressing: Moderate Assistance - Patient 50 - 74%     Toileting Toileting    Toileting assist Assist for toileting: Moderate Assistance - Patient 50 - 74%     Transfers Chair/bed transfer  Transfers assist     Chair/bed transfer assist level: Minimal Assistance - Patient > 75%     Locomotion Ambulation   Ambulation assist      Assist level: Moderate Assistance - Patient 50 - 74% Assistive device:  Parallel bars Max distance: 4   Walk 10 feet activity   Assist  Walk 10 feet activity did not occur: Safety/medical concerns (per PT eval note, ambulation only in parallel bars 4 feet.)        Walk 50 feet activity   Assist Walk 50 feet with 2 turns activity did not occur: Safety/medical concerns         Walk 150 feet activity   Assist Walk 150 feet activity did not occur: Safety/medical concerns         Walk 10 feet on uneven surface  activity   Assist Walk 10 feet on uneven surfaces activity did not occur: Safety/medical concerns         Wheelchair     Assist Is the patient using a wheelchair?: Yes Type of Wheelchair: Manual    Wheelchair assist level: Minimal Assistance - Patient > 75% Max wheelchair distance: 120'    Wheelchair 50 feet with 2 turns  activity    Assist        Assist Level: Minimal Assistance - Patient > 75%   Wheelchair 150 feet activity     Assist      Assist Level: Maximal Assistance - Patient 25 - 49%   Blood pressure (!) 145/85, pulse 69, temperature 97.8 F (36.6 C), temperature source Oral, resp. rate (!) 22, height '5\' 3"'$  (1.6 m), weight 56.5 kg, last menstrual period 01/22/1992, SpO2 97 %.  Medical Problem List and Plan: 1. Functional deficits secondary to debility after PFO closure with hx recent R Fibular fx NWB x 6wk             -patient may  shower             -ELOS/Goals: 7-10d  Sup   Con't CIR- PT and OT- NWB on RLE x6 weeks  D/c 07/27/21  Con't CIR- PT and OT 2.  Antithrombotics: -DVT/anticoagulation:  Pharmaceutical: Lovenox             -antiplatelet therapy: ASA and Clopidigrel   3. Pain Management: tylenol;tramadol  6/26- no pain as long as NWB maintained- con't regimen  6/27- pt didn't want any pain meds added to regimen 4. Mood/Sleep: trazodone             -antipsychotic agents: NA 5. Neuropsych/cognition: This patient is  capable of making decisions on her own behalf. 6. Skin/Wound Care: [facility] for MASD  6/26- severe per nursing- wasn't able to assess- if doesn't improve, will give a few days of Diflucan  6/29- doing a little better- con't regimen 7. Fluids/Electrolytes/Nutrition: BMET in am , monitor K+  6/26- K+ 3.4- will give 40 mEq x1 and recheck in AM  6/28- K+ 3.9- was 4.6 yesterday- 7/1- recheck Monday  8.  PFO closed per cardiology Dr Einar Gip 9.  R fibular fx in [place], NWB x  6 wks , f/u [facility] after DC 10.  Sinus tach on BB 11.  Polycythemia CBC in am  12.  UTI >100K Pseudomonas A, Cefepime IV per pharm  6/26- Dr Dwyane Dee suggested switching to Cipro 500 mg BID x 5 days- have done so and stopped Cefepime- will recheck labs in AM  6/27- WBC bounced up very slightly to 11.2 from 10.7- will recheck again in Am to make sure heading in right direction  6/28- since  WBC 10.8- afebrile- will con't Cipro and monitor clinically for Sx's- did need in/out cath x1 last night- having some frequency still. Will do 7 days  since still strongly (+) U/A  6/29- no Sx's of UTI anymore and no cath required-  13. Hx ETOH use - none currently- cessation education  14. Hypotension  6/27- BP 80/52 this AM- even with recheck- sitting up- will order TEDs- advised pt on drinking enough fluids- at least 6-8 cups water/day- she admits hasn't been drinking enough- held B Blocker this AM  6/28 - Lopressor held again last night- BP 150V-697X systolic- depending on BP prior to lorpessor dose, might hold again- d/w nursing.   6/29- BP 98/63 this AM- but up to 146/79 this Am as well.   6/30- BP 100/65- no Sx's of hypotension- con't regimen  7/1- BP a little elevated, but lopressor was held- will reduce Lopressor to 12.5 mg BID so might need to be held so much.        LOS: 6 days A FACE TO FACE EVALUATION WAS PERFORMED  Ceola Para 07/21/2021, 11:09 AM

## 2021-07-21 NOTE — Progress Notes (Addendum)
Lopressor not given tonight d/t soft BP (107/63). BP was actually a little more elevated than the two previous BP's of 90/59 & 86/62. It was my clinical judgement to not give medication in anticipation that it may lower BP throughout night. Pt's daughter is at bedside and is also in agreement that she would like this night dose to at least be held tonight so that it won't possibly stop pt from going to therapy in the morning from a low BP. (She is aware that the dose for tonight was decreased to 12.5 mg.) Will continue to monitor throughout night.

## 2021-07-22 NOTE — Progress Notes (Signed)
PROGRESS NOTE   Subjective/Complaints:  Pt reports no issues- Her daughter notes pt has gotten stronger since she last saw her earlier in week.  Wondering what her thiamine level is.  LBM just now- on toilet.  B Blocker was held overnight due to low BP.   ROS:  Pt denies SOB, abd pain, CP, N/V/C/D, and vision changes  Objective:   No results found.  No results for input(s): "WBC", "HGB", "HCT", "PLT" in the last 72 hours.  No results for input(s): "NA", "K", "CL", "CO2", "GLUCOSE", "BUN", "CREATININE", "CALCIUM" in the last 72 hours.   Intake/Output Summary (Last 24 hours) at 07/22/2021 0843 Last data filed at 07/22/2021 0748 Gross per 24 hour  Intake 657 ml  Output --  Net 657 ml        Physical Exam: Vital Signs Blood pressure 128/82, pulse 77, temperature 98 F (36.7 C), temperature source Oral, resp. rate 18, height '5\' 3"'$  (1.6 m), weight 56.5 kg, last menstrual period 01/22/1992, SpO2 97 %.        General: awake, alert, appropriate, on toilet with NT in room; daughter also at bedside; NAD HENT: conjugate gaze; oropharynx moist CV: regular rate; no JVD Pulmonary: CTA B/L; no W/R/R- good air movement GI: soft, NT, ND, (+)BS Psychiatric: appropriate- calm Neurological: poor memory; alert Musculoskeletal:     Comments: RLE with CAM boot/ splint bruising at right toes - entire foot bruised on R- evolving bruises Skin:    General: Skin is warm and dry.  Neurological:     Mental Status: She is alert and oriented to person, place, and time.     Sensory: No sensory deficit.     Motor: Weakness present.     Gait: Gait abnormal.     Comments: 5/5 in BUE 4/5 B HF and KE, L DF PF, RLE in splint   Sensation intact to LT   Assessment/Plan: 1. Functional deficits which require 3+ hours per day of interdisciplinary therapy in a comprehensive inpatient rehab setting. Physiatrist is providing close team  supervision and 24 hour management of active medical problems listed below. Physiatrist and rehab team continue to assess barriers to discharge/monitor patient progress toward functional and medical goals  Care Tool:  Bathing    Body parts bathed by patient: Right arm, Left arm, Chest, Abdomen, Front perineal area, Buttocks, Right upper leg, Left upper leg, Left lower leg, Face   Body parts bathed by helper: Right lower leg Body parts n/a: Right lower leg   Bathing assist Assist Level: Moderate Assistance - Patient 50 - 74%     Upper Body Dressing/Undressing Upper body dressing   What is the patient wearing?: Pull over shirt, Bra    Upper body assist Assist Level: Supervision/Verbal cueing    Lower Body Dressing/Undressing Lower body dressing      What is the patient wearing?: Pants, Incontinence brief     Lower body assist Assist for lower body dressing: Moderate Assistance - Patient 50 - 74%     Toileting Toileting    Toileting assist Assist for toileting: Moderate Assistance - Patient 50 - 74%     Transfers Chair/bed transfer  Transfers assist  Chair/bed transfer assist level: Minimal Assistance - Patient > 75%     Locomotion Ambulation   Ambulation assist      Assist level: Moderate Assistance - Patient 50 - 74% Assistive device: Parallel bars Max distance: 4   Walk 10 feet activity   Assist  Walk 10 feet activity did not occur: Safety/medical concerns (per PT eval note, ambulation only in parallel bars 4 feet.)        Walk 50 feet activity   Assist Walk 50 feet with 2 turns activity did not occur: Safety/medical concerns         Walk 150 feet activity   Assist Walk 150 feet activity did not occur: Safety/medical concerns         Walk 10 feet on uneven surface  activity   Assist Walk 10 feet on uneven surfaces activity did not occur: Safety/medical concerns         Wheelchair     Assist Is the patient using a  wheelchair?: Yes Type of Wheelchair: Manual    Wheelchair assist level: Minimal Assistance - Patient > 75% Max wheelchair distance: 120'    Wheelchair 50 feet with 2 turns activity    Assist        Assist Level: Minimal Assistance - Patient > 75%   Wheelchair 150 feet activity     Assist      Assist Level: Maximal Assistance - Patient 25 - 49%   Blood pressure 128/82, pulse 77, temperature 98 F (36.7 C), temperature source Oral, resp. rate 18, height '5\' 3"'$  (1.6 m), weight 56.5 kg, last menstrual period 01/22/1992, SpO2 97 %.  Medical Problem List and Plan: 1. Functional deficits secondary to debility after PFO closure with hx recent R Fibular fx NWB x 6wk             -patient may  shower             -ELOS/Goals: 7-10d  Sup   Con't CIR- PT and OT- NWB on RLE x6 weeks  D/c 07/27/21  Con't CIR- PT and OT 2.  Antithrombotics: -DVT/anticoagulation:  Pharmaceutical: Lovenox             -antiplatelet therapy: ASA and Clopidigrel   3. Pain Management: tylenol;tramadol  6/26- no pain as long as NWB maintained- con't regimen  6/27- pt didn't want any pain meds added to regimen 4. Mood/Sleep: trazodone             -antipsychotic agents: NA 5. Neuropsych/cognition: This patient is  capable of making decisions on her own behalf. 6. Skin/Wound Care: [facility] for MASD  6/26- severe per nursing- wasn't able to assess- if doesn't improve, will give a few days of Diflucan  6/29- doing a little better- con't regimen 7. Fluids/Electrolytes/Nutrition: BMET in am , monitor K+  6/26- K+ 3.4- will give 40 mEq x1 and recheck in AM  6/28- K+ 3.9- was 4.6 yesterday- 7/1- recheck Monday  8.  PFO closed per cardiology Dr Einar Gip 9.  R fibular fx in [place], NWB x  6 wks , f/u [facility] after DC 10.  Sinus tach on BB 11.  Polycythemia CBC in am  12.  UTI >100K Pseudomonas A, Cefepime IV per pharm  6/26- Dr Dwyane Dee suggested switching to Cipro 500 mg BID x 5 days- have done so and stopped  Cefepime- will recheck labs in AM  6/27- WBC bounced up very slightly to 11.2 from 10.7- will recheck again in Am to make  sure heading in right direction  6/28- since WBC 10.8- afebrile- will con't Cipro and monitor clinically for Sx's- did need in/out cath x1 last night- having some frequency still. Will do 7 days since still strongly (+) U/A  6/29- no Sx's of UTI anymore and no cath required-  13. Hx ETOH use - none currently- cessation education  14. Hypotension  6/27- BP 80/52 this AM- even with recheck- sitting up- will order TEDs- advised pt on drinking enough fluids- at least 6-8 cups water/day- she admits hasn't been drinking enough- held B Blocker this AM  6/28 - Lopressor held again last night- BP 194R-740C systolic- depending on BP prior to lorpessor dose, might hold again- d/w nursing.   6/29- BP 98/63 this AM- but up to 146/79 this Am as well.   6/30- BP 100/65- no Sx's of hypotension- con't regimen  7/1- BP a little elevated, but lopressor was held- will reduce Lopressor to 12.5 mg BID so might need to be held so much.   7/2- lopressor still held due to BP in 80s-90s last night- would stop, however when it's held, HR goes >100 frequently.  15. Poor memory  7/2- Will check thiamine level on Monday 16. Dispo  7/2- will allow grounds pass with family.   I spent a total of 36   minutes on total care today- >50% coordination of care- due to d/w nursing about low BP/tachycardia/lopressor, as wlel as daughter about thiamine and grounds pass.     LOS: 7 days A FACE TO FACE EVALUATION WAS PERFORMED  Emmaus Brandi 07/22/2021, 8:43 AM

## 2021-07-23 NOTE — Progress Notes (Signed)
PROGRESS NOTE   Subjective/Complaints:   Per PT does ok with NWB RLE ~80% of time , no RIght ankle pain   ROS:  Pt denies SOB, abd pain, CP, N/V/C/D, and vision changes  Objective:   No results found.  No results for input(s): "WBC", "HGB", "HCT", "PLT" in the last 72 hours.  No results for input(s): "NA", "K", "CL", "CO2", "GLUCOSE", "BUN", "CREATININE", "CALCIUM" in the last 72 hours.   Intake/Output Summary (Last 24 hours) at 07/23/2021 1026 Last data filed at 07/23/2021 0815 Gross per 24 hour  Intake 960 ml  Output --  Net 960 ml         Physical Exam: Vital Signs Blood pressure 114/68, pulse 90, temperature 98.4 F (36.9 C), temperature source Oral, resp. rate 14, height '5\' 3"'$  (1.6 m), weight 56.5 kg, last menstrual period 01/22/1992, SpO2 98 %.  General: No acute distress Mood and affect are appropriate Heart: Regular rate and rhythm no rubs murmurs or extra sounds Lungs: Clear to auscultation, breathing unlabored, no rales or wheezes Abdomen: Positive bowel sounds, soft nontender to palpation, nondistended Extremities: No clubbing, cyanosis, or edema Skin: No evidence of breakdown, no evidence of rash   Musculoskeletal:     Comments: RLE with CAM boot/ splint bruising at right toes - entire foot bruised on R- evolving bruises Skin:    General: Skin is warm and dry.  Neurological:     Mental Status: She is alert and oriented to person, place, and time.     Sensory: No sensory deficit.     Motor: Weakness present.     Gait: Gait abnormal.     Comments: 5/5 in BUE 4/5 B HF and KE, L DF PF, RLE in splint   Sensation intact to LT   Assessment/Plan: 1. Functional deficits which require 3+ hours per day of interdisciplinary therapy in a comprehensive inpatient rehab setting. Physiatrist is providing close team supervision and 24 hour management of active medical problems listed below. Physiatrist and  rehab team continue to assess barriers to discharge/monitor patient progress toward functional and medical goals  Care Tool:  Bathing    Body parts bathed by patient: Right arm, Left arm, Chest, Abdomen, Front perineal area, Buttocks, Right upper leg, Left upper leg, Left lower leg, Face   Body parts bathed by helper: Right lower leg Body parts n/a: Right lower leg   Bathing assist Assist Level: Minimal Assistance - Patient > 75%     Upper Body Dressing/Undressing Upper body dressing   What is the patient wearing?: Pull over shirt, Bra    Upper body assist Assist Level: Independent    Lower Body Dressing/Undressing Lower body dressing      What is the patient wearing?: Pants, Incontinence brief     Lower body assist Assist for lower body dressing: Minimal Assistance - Patient > 75%     Toileting Toileting    Toileting assist Assist for toileting: Minimal Assistance - Patient > 75%     Transfers Chair/bed transfer  Transfers assist     Chair/bed transfer assist level: Minimal Assistance - Patient > 75%     Locomotion Ambulation   Ambulation assist  Assist level: Moderate Assistance - Patient 50 - 74% Assistive device: Parallel bars Max distance: 4   Walk 10 feet activity   Assist  Walk 10 feet activity did not occur: Safety/medical concerns (per PT eval note, ambulation only in parallel bars 4 feet.)        Walk 50 feet activity   Assist Walk 50 feet with 2 turns activity did not occur: Safety/medical concerns         Walk 150 feet activity   Assist Walk 150 feet activity did not occur: Safety/medical concerns         Walk 10 feet on uneven surface  activity   Assist Walk 10 feet on uneven surfaces activity did not occur: Safety/medical concerns         Wheelchair     Assist Is the patient using a wheelchair?: Yes Type of Wheelchair: Manual    Wheelchair assist level: Minimal Assistance - Patient > 75% Max  wheelchair distance: 120'    Wheelchair 50 feet with 2 turns activity    Assist        Assist Level: Minimal Assistance - Patient > 75%   Wheelchair 150 feet activity     Assist      Assist Level: Maximal Assistance - Patient 25 - 49%   Blood pressure 114/68, pulse 90, temperature 98.4 F (36.9 C), temperature source Oral, resp. rate 14, height '5\' 3"'$  (1.6 m), weight 56.5 kg, last menstrual period 01/22/1992, SpO2 98 %.  Medical Problem List and Plan: 1. Functional deficits secondary to debility after PFO closure with hx recent R Fibular fx NWB x 6wk             -patient may  shower             -ELOS/Goals: 7-10d  Sup   Con't CIR- PT and OT- NWB on RLE x6 weeks  D/c 07/27/21  Con't CIR- PT and OT 2.  Antithrombotics: -DVT/anticoagulation:  Pharmaceutical: Lovenox             -antiplatelet therapy: ASA and Clopidigrel   3. Pain Management: tylenol;tramadol  6/26- no pain as long as NWB maintained- con't regimen  6/27- pt didn't want any pain meds added to regimen 4. Mood/Sleep: trazodone             -antipsychotic agents: NA 5. Neuropsych/cognition: This patient is  capable of making decisions on her own behalf. 6. Skin/Wound Care: [facility] for MASD  6/26- severe per nursing- wasn't able to assess- if doesn't improve, will give a few days of Diflucan  6/29- doing a little better- con't regimen 7. Fluids/Electrolytes/Nutrition: BMET in am , monitor K+      Latest Ref Rng & Units 07/18/2021    5:41 AM 07/17/2021    6:30 AM 07/15/2021    3:08 PM  BMP  Glucose 70 - 99 mg/dL 100  99    BUN 8 - 23 mg/dL 6  7    Creatinine 0.44 - 1.00 mg/dL 0.61  0.68  0.66   Sodium 135 - 145 mmol/L 138  137    Potassium 3.5 - 5.1 mmol/L 3.9  4.6    Chloride 98 - 111 mmol/L 109  108    CO2 22 - 32 mmol/L 23  25    Calcium 8.9 - 10.3 mg/dL 8.7  8.9      8.  PFO closed per cardiology Dr Einar Gip 9.  R fibular fx in [place], NWB x  6 wks ,  f/u [facility] after DC 10.  Sinus tach on  BB 11.  Polycythemia CBC in am  12.  UTI >100K Pseudomonas A, Cefepime IV per pharm  6/26- Dr Dwyane Dee suggested switching to Cipro 500 mg BID x 5 days- have done so and stopped Cefepime- will recheck labs in AM  6/27- WBC bounced up very slightly to 11.2 from 10.7- will recheck again in Am to make sure heading in right direction  6/28- since WBC 10.8- afebrile- will con't Cipro and monitor clinically for Sx's- did need in/out cath x1 last night- having some frequency still. Will do 7 days since still strongly (+) U/A  6/29- no Sx's of UTI anymore and no cath required-  13. Hx ETOH use - none currently- cessation education  14. Hypotension  6/27- BP 80/52 this AM- even with recheck- sitting up- will order TEDs- advised pt on drinking enough fluids- at least 6-8 cups water/day- she admits hasn't been drinking enough- held B Blocker this AM  6/28 - Lopressor held again last night- BP 170Y-174B systolic- depending on BP prior to lorpessor dose, might hold again- d/w nursing.   6/29- BP 98/63 this AM- but up to 146/79 this Am as well.   6/30- BP 100/65- no Sx's of hypotension- con't regimen  7/1- BP a little elevated, but lopressor was held- will reduce Lopressor to 12.5 mg BID so might need to be held so much.   7/2- lopressor still held due to BP in 80s-90s last night- would stop, however when it's held, HR goes >100 frequently.  Vitals:   07/23/21 0458 07/23/21 0810  BP: (!) 150/83 114/68  Pulse: 81 90  Resp: 18 14  Temp: 98.4 F (36.9 C)   SpO2: 98%    Some BP lability but HR <100BPM  15. Poor memory  7/2- Will check thiamine level on Monday 16. Dispo  7/2- will allow grounds pass with family.      LOS: 8 days A FACE TO FACE EVALUATION WAS PERFORMED  Charlett Blake 07/23/2021, 10:26 AM

## 2021-07-23 NOTE — Progress Notes (Signed)
Physical Therapy Session Note  Patient Details  Name: Monique Ballard MRN: [id number] Date of Birth: November 03, 1940  Today's Date: 07/23/2021 PT Individual Time: 1000-1100 PT Individual Time Calculation (min): 60 min   Short Term Goals: Week 1:  PT Short Term Goal 1 (Week 1): Pt will perform bed to wc to bed w/supervision via lateral scoot PT Short Term Goal 2 (Week 1): Pt will perform sit to stand w/RW from standard height w/min to light mod assist maintaining NWB RLE PT Short Term Goal 3 (Week 1): Pt will ambulate 32f w/LRAD and min assist, adhering to [place] PT Short Term Goal 4 (Week 1): pt will propel wc 109fw/supervision  Skilled Therapeutic Interventions/Progress Updates:    Pt seated in w/c on arrival and agreeable to therapy. No complaint of pain. Pt propelled w/c with BUE throughout session with supervision., including turning around at mat table x 4. Required cues for safe use of w/c brakes with mod carryover of instruction. Pt participated in block practice of squat pivot for improved safety with transfers and WB compliance. Required question cues throughout for sequencing. Pt able to motor plan movement but mildly impulsive and transfers before setting RLE to avoid WB. Improved carryover and recall of sequencing with repetition, as little as supervision without cues at times. Pt began to require increased cues again with fatigue. Transitioned to Sit to stand practice from elevated mat table. 4 x 6 from slightly elevated table. Pt largely able to maintain NWB, with occ heel touching floor but not WB. Required increased table height to maintain with fatigue. Pt then returned to w/c and was transported back to room, was left with all needs in reach and alarm active.   Therapy Documentation Precautions:  Precautions Precautions: Fall Precaution Comments: watch vitals (orthostatic & hypoxic earlier in admission) Restrictions Weight Bearing Restrictions: Yes RLE Weight Bearing: Non  weight bearing General:       Therapy/Group: Individual Therapy  OlMickel Fuchs/03/2021, 10:15 AM

## 2021-07-23 NOTE — Progress Notes (Signed)
Occupational Therapy Weekly Progress Note  Patient Details  Name: Monique Ballard MRN: [id number] Date of Birth: July 19, 1940  Beginning of progress report period: July 16, 2021 End of progress report period: July 23, 2021  Today's Date: 07/23/2021 OT Individual Time: 0800-0915 1st session, 1300-1400 2nd session  OT Individual Time Calculation (min): 75 min, 60 min    Patient has met 4 of 4 short term goals.  Pt made steady gains in skilled OT since IE> Pt was unable to maintain NWB precautions without significant support and assist for R LE and now has improved greatly. All STG's met and upgraded goals in place. Pt's has had daughter present this day for Family Education in prep for tentative discharge Friday.   Patient continues to demonstrate the following deficits: muscle weakness and decreased cardiorespiratoy endurance and therefore will continue to benefit from skilled OT intervention to enhance overall performance with BADL, iADL, and Reduce care partner burden.  Patient progressing toward long term goals..  Continue plan of care.  OT Short Term Goals Week 1:  OT Short Term Goal 1 (Week 1): Pt to be Mod A for LB bathing/dressing with LRD OT Short Term Goal 1 - Progress (Week 1): Met OT Short Term Goal 2 (Week 1): Pt to be Min A for functional transfers with LRD OT Short Term Goal 2 - Progress (Week 1): Met OT Short Term Goal 3 (Week 1): Pt to be Min A for toileting tasks OT Short Term Goal 3 - Progress (Week 1): Met OT Short Term Goal 4 (Week 1): Pt will maintain NWB status with minimal cuing OT Short Term Goal 4 - Progress (Week 1): Met OT Short Term Goal 5 (Week 1): Pt will be Mod i for UB bathing/dressing OT Short Term Goal 5 - Progress (Week 1): Met Week 2:  OT Short Term Goal 1 (Week 2): Pt will require CGA for all LB self care using AE excluding TEDS and R CAM boot OT Short Term Goal 2 (Week 2): Pt will be CGA for toilet and shower bench transfer with DME and grab bars OT  Short Term Goal 3 (Week 2): Pt will stand at counter or sink up to 4 min on L LE with NWB on R LE for simple functional task OT Short Term Goal 4 (Week 2): Family will be indep with all aspects of functional routine for self care and mobility in preparation for safe d/c home  Skilled Therapeutic Interventions/Progress Updates:  1st session: Pt in bed with am meal completed upon OT arrival. OT assisted with S only for bed mobility to EOB. VSR at rest then during and after treatment session as follows: 114/68 BP;  91 HR, seated; 112/72 BP, 94 HR last grab bar stand after shower and dressing. Pt was able to perform all transfers with CGA and mod cues and occasional tactile support to maintain R LE NWB. Functional performance with OT facilitation and safety training includes: Toileting with min A including peri hygiene, UB bathing shower level with close S, min-mod a LB self care, UB dressing set up, grooming seated level with set up, CGA with mod cues toilet and bed transfers, min A tub bench transfer. OT plan to complete family educ later session if daughter available. Left pt up in w/c with chair alarm engaged and all needs within reach.   2nd session: Pt's dtr Monique Ballard present for Family Education this pm. Pt's dtr is a PT at [employer] and familiar with safety and functional  assist techniques. All DME has been delivered to room and dtr to take home with her this pm including w/c, cushion, 3 in 1 commode and RW. Pt already has transfer tub bench in the home. Pt's dtr educated and trained in NWB R LE strategies for transfers bed to and from w/c to R and L sides, toilet transfer with grab bar and commode, OT transported pt w/c level to demo apt for tub transfer bench safety training where pt was able to perform with CGA-min A to R side and off to L side with CGA and min-mod cues. Standing tolerance measured and light functional standing reach training at kitchen counter with 2 intervals to both sides 1 minute during  dynamic tasks. Pt able to maintain R LE off the ground to reach ~ 4" obos unilaterally. OT training with dtr and pt for light tband UE there ex and secure messaged team for communication to clear pt's dtr to assist her mom in the room. OT updated Safety Tool in room to reflect. Pt left up in w/c with dtr with all safety measures and needs within reach.    Therapy Documentation Precautions:  Precautions Precautions: Fall Precaution Comments: watch vitals (orthostatic & hypoxic earlier in admission) Restrictions Weight Bearing Restrictions: Yes RLE Weight Bearing: Non weight bearing    Therapy/Group: Individual Therapy  Monique Ballard 07/23/2021, 8:06 AM

## 2021-07-24 LAB — GLUCOSE, CAPILLARY: Glucose-Capillary: 95 mg/dL (ref 70–99)

## 2021-07-24 NOTE — Progress Notes (Signed)
Physical Therapy Discharge Summary  Patient Details  Name: Monique Ballard MRN: [id number] Date of Birth: [birth year]  Today's Date: 07/26/2021       Patient has met 4 of 8 long term goals due to improved activity tolerance, improved balance, increased strength, and ability to compensate for deficits.  Patient to discharge at a wheelchair level Supervision.   Patient's care partner is independent to provide the necessary physical assistance at discharge. Pt to d/c home with her husband and daughter, who has undergone family education and provides appropriate assist. Pt to d/c at w/c level as she has difficulty maintaining WB precautions in standing.  Reasons goals not met: Pt remained with consistent difficulty maintaining NWB precautions during all transitional movements when not provided with mod to max multimodal cues. Due to this pt required minA for car transfers and was consistently CGA for standing activities. Due to poor adherence of NWB precautions there was limited progression for standing and ambulation activities. Pt's daughter was present for extensive family education and will be bumping w/c up/down stairs upon d/c.   Recommendation:  Patient will benefit from ongoing skilled PT services in home health setting to continue to advance safe functional mobility, address ongoing impairments in strength, balance, WB precautions, and minimize fall risk.  Equipment: RW and 18x18 w/c  Reasons for discharge: treatment goals met and discharge from hospital  Patient/family agrees with progress made and goals achieved: Yes  PT Discharge Precautions/Restrictions Precautions Precautions: Fall Precaution Comments: watch vitals (orthostatic & hypoxic earlier in admission) Restrictions Weight Bearing Restrictions: Yes RLE Weight Bearing: Non weight bearing Vital Signs  Pain Pain Assessment Pain Scale: 0-10 Pain Score: 0-No pain Pain Interference  Pain Interference Pain Effect on  Sleep: 1. Rarely or not at all Pain Interference with Therapy Activities: 1. Rarely or not at all Pain Interference with Day-to-Day Activities: 1. Rarely or not at all Vision/Perception  Vision - History Ability to See in Adequate Light: 0 Adequate  Cognition Overall Cognitive Status: Within Functional Limits for tasks assessed Arousal/Alertness: Awake/alert Orientation Level: Oriented X4 Year: 2023 Month: July Day of Week: Correct Sensation Sensation Light Touch: Appears Intact Hot/Cold: Appears Intact Proprioception: Appears Intact Stereognosis: Not tested Coordination Gross Motor Movements are Fluid and Coordinated: No Fine Motor Movements are Fluid and Coordinated: No Coordination and Movement Description: NWB status Motor  Motor Motor: Within Functional Limits Motor - Discharge Observations: RLE NWB  Mobility  Bed Mobility Bed Mobility: Rolling Right;Rolling Left;Supine to Sit;Sit to Supine Rolling Right: Independent Rolling Left: Independent Supine to Sit: Independent with assistive device Sit to Supine: Independent with assistive device Transfers Transfers: Sit to Stand;Stand to Sit;Stand Pivot Transfers Sit to Stand: Contact Guard/Touching assist Stand to Sit: Contact Guard/Touching assist Stand Pivot Transfers: Contact Guard/Touching assist Lateral/Scoot Transfers: Contact Guard/Touching assist Transfer (Assistive device): Rolling walker Locomotion Gait Ambulation: Yes Gait Assistance: Minimal Assistance - Patient > 75% Gait Distance (Feet): 3 Feet Assistive device: Rolling walker Gait Assistance Details: Verbal cues for precautions/safety Gait Assistance Details: verbal cues to maintain NWB Gait Gait: Yes Gait Pattern: Impaired Gait Pattern:  (hopping due to NWB) Gait velocity: decreased Stairs / Additional Locomotion Stairs: No Wheelchair Mobility Wheelchair Mobility: Yes Wheelchair Assistance: Set up Lexicographer: Both upper  extremities Wheelchair Parts Management: Needs assistance Distance: 176f  Trunk/Postural Assessment  Cervical Assessment Cervical Assessment: Within Functional Limits Thoracic Assessment Thoracic Assessment: Within Functional Limits Lumbar Assessment Lumbar Assessment: Within Functional Limits Postural Control Postural Control: Deficits on evaluation  Balance  Balance Balance Assessed: Yes Dynamic Sitting Balance Dynamic Sitting - Balance Support: Feet supported Dynamic Sitting - Level of Assistance: 6: Modified independent (Device/Increase time) Static Standing Balance Static Standing - Balance Support: Bilateral upper extremity supported Static Standing - Level of Assistance: 5: Stand by assistance Dynamic Standing Balance Dynamic Standing - Balance Support: Bilateral upper extremity supported Dynamic Standing - Level of Assistance: 4: Min assist Extremity Assessment  RUE Assessment RUE Assessment: Within Functional Limits LUE Assessment LUE Assessment: Within Functional Limits RLE Assessment Passive Range of Motion (PROM) Comments: R LL immobilized in boot Active Range of Motion (AROM) Comments: RLL immobilized otherwise WNLs General Strength Comments: hip 4-/5, knee 4+ LLE Assessment LLE Assessment: Exceptions to [facility] Passive Range of Motion (PROM) Comments: wnl Active Range of Motion (AROM) Comments: wnl General Strength Comments: hip 4-/5, knee and ankle 4+/5    Rosita DeChalus 07/26/2021, 8:27 AM

## 2021-07-24 NOTE — Progress Notes (Signed)
Physical Therapy Session Note  Patient Details  Name: Monique Ballard MRN: [id number] Date of Birth: 1940/06/23  Today's Date: 07/24/2021 PT Individual Time: 0900-1000 PT Individual Time Calculation (min): 60 min   Short Term Goals: Week 1:  PT Short Term Goal 1 (Week 1): Pt will perform bed to wc to bed w/supervision via lateral scoot PT Short Term Goal 2 (Week 1): Pt will perform sit to stand w/RW from standard height w/min to light mod assist maintaining NWB RLE PT Short Term Goal 3 (Week 1): Pt will ambulate 67f w/LRAD and min assist, adhering to [place] PT Short Term Goal 4 (Week 1): pt will propel wc 103fw/supervision  Skilled Therapeutic Interventions/Progress Updates:    pt received in bed and agreeable to therapy. No complaint of pain. Pt's Daughter, TrOlivia Mackiepresent for family education. Therapist provided education on NWB status and utilizing squat pivot as best way to maintain NWB. Pt's daughter states they have been using a modified stand pivot and demoes EOB>w/c<>commode. Pt not able to fully maintain NWB. Educated on NWB orders. Transfers otherwise safe. Pt's daughter assisted with toileting and dressing, including donning/doffing cam boot. Demoes appropriate assist. Session focused on d/c planning and family education with emphasis on transfers and mobility. TrOlivia Mackieeports plan to bump pt up stairs in w/c, provided with verbal explanantion and hand out. Pt propelled w/c with BUE 2 x 150 ft, able to recall brake safety with question cues. Pt also performed Sit to stand to RW with min A 2 x 5 for strengthening. Pt then remained in w/c and was left with all needs in reach and alarm active.   Therapy Documentation Precautions:  Precautions Precautions: Fall Precaution Comments: watch vitals (orthostatic & hypoxic earlier in admission) Restrictions Weight Bearing Restrictions: Yes RLE Weight Bearing: Non weight bearing General:       Therapy/Group: Individual  Therapy  OlMickel Fuchs/04/2021, 12:52 PM

## 2021-07-24 NOTE — Patient Care Conference (Signed)
Inpatient RehabilitationTeam Conference and Plan of Care Update Date: 07/24/2021   Time: 11:36 AM    Patient Name: Monique Ballard      Medical Record Number: [id number]  Date of Birth: December 13, 1940 Sex: Female         Room/Bed: 4W15C/4W15C-01 Payor Info: Payor: MEDICARE / Plan: MEDICARE PART A AND B / Product Type: *No Product type* /    Admit Date/Time:  07/15/2021  1:25 PM  Primary Diagnosis:  [facility] Problems: Principal Problem:   Debility    Expected Discharge Date: Expected Discharge Date: 07/27/21  Team Members Present: Physician leading conference: Dr. Courtney Heys Social Worker Present: Ovidio Kin, LCSW Nurse Present: Dorthula Nettles, RN PT Present: Ailene Rud, PT OT Present: Jamey Ripa, OT SLP Present: Helaine Chess, SLP     Current Status/Progress Goal Weekly Team Focus  Bowel/Bladder   continent b/b  remain continent  toilet as needed   Swallow/Nutrition/ Hydration             ADL's   min-mod a LB self care, CGA with mod cues toilet and bed transfers, min A tub bench transfer     has all DME, Dtr has been trained, progress standing tolerance for ADL's on L LE   Mobility   CGA with squat pivot transfers, min A STS and stand pivot  downgraded to supervision  strength, endurance   Communication             Safety/Cognition/ Behavioral Observations            Pain   no reported pain  < 3  assess pain q 4hr and prn   Skin   MASD healing  no new breakdown  assess skin q shift and prn     Discharge Planning:  Husband and daughter to assist-mainly husband will need to do family training prior to DC Friday. Daughter is a PT at [employer] Discussion: Had bowel movement today. Cognition at baseline. UTI treated. Continent B/B, no pain reported, MASD healing. Discharging home with husband who has back issues. Daughter to assist more with care.  Patient on target to meet rehab goals: yes, supervision goals downgraded from Mod I. Daughter  assisting with transfers. Patient requires cues. Transfers from sofa and bed. Stood over an hour with no weight on extremity.  *See Care Plan and progress notes for long and short-term goals.   Revisions to Treatment Plan:  Preparing discharge orders   Teaching Needs: Family education, medication management, skin/wound care, transfer/gait training, etc.   Current Barriers to Discharge: Decreased caregiver support, Home enviroment access/layout, Wound care, and Weight bearing restrictions  Possible Resolutions to Barriers: Family education Follow-up therapy Order recommended DME     Medical Summary Current Status: MASD MUCH better- almost healed- daughter to help with it- UTI treated-poor cognition is stable/chronic- usually continent- no pain  Barriers to Discharge: Home enviroment access/layout;Wound care;Weight bearing restrictions  Barriers to Discharge Comments: requires step by step cuing- sticks to NWB ~80% of time. Possible Resolutions to [employer]: barriers- needs to do squat pivot, using stand pivot- and puts some weight through foot sometimes- is safe- downgraded goals to supervision- no ambulation goals since NWB and cannot maintain NWB and walk due to UB weakness-looking into private hire help at home- d/c- 7/7   Continued Need for Acute Rehabilitation Level of Care: The patient requires daily medical management by a physician with specialized training in physical medicine and rehabilitation for the following  reasons: Direction of a multidisciplinary physical rehabilitation program to maximize functional independence : Yes Medical management of patient stability for increased activity during participation in an intensive rehabilitation regime.: Yes Analysis of laboratory values and/or radiology reports with any subsequent need for medication adjustment and/or medical intervention. : Yes   I attest that I was present, lead the team conference, and concur with  the assessment and plan of the team.   Cristi Loron 07/24/2021, 4:13 PM

## 2021-07-24 NOTE — Plan of Care (Signed)
  Problem: Consults Goal: RH GENERAL PATIENT EDUCATION Description: See Patient Education module for education specifics. Outcome: Progressing   Problem: RH BLADDER ELIMINATION Goal: RH STG MANAGE BLADDER WITH ASSISTANCE Description: STG Manage Bladder With Supervision Assistance Outcome: Progressing Goal: RH STG MANAGE BLADDER WITH MEDICATION WITH ASSISTANCE Description: STG Manage Bladder With Medication With supervision Assistance. Outcome: Progressing   Problem: RH SAFETY Goal: RH STG ADHERE TO SAFETY PRECAUTIONS W/ASSISTANCE/DEVICE Description: STG Adhere to Safety Precautions With Cues and reminders. Outcome: Progressing Goal: RH STG DECREASED RISK OF FALL WITH ASSISTANCE Description: STG Decreased Risk of Fall With Supervision Assistance. Outcome: Progressing   Problem: RH KNOWLEDGE DEFICIT GENERAL Goal: RH STG INCREASE KNOWLEDGE OF SELF CARE AFTER HOSPITALIZATION Description: Patient will demonstrate knowledge of self-care management, medication management, weight bearing precautions with educational materials and handouts provided by staff independently at discharge. Outcome: Progressing

## 2021-07-24 NOTE — Progress Notes (Signed)
Occupational Therapy Session Note  Patient Details  Name: Monique Ballard MRN: [id number] Date of Birth: [birth year]  Today's Date: 07/24/2021 OT Individual Time: 1005-1100 OT Individual Time Calculation (min): 55 min    Short Term Goals: Week 2:  OT Short Term Goal 1 (Week 2): Pt will require CGA for all LB self care using AE excluding TEDS and R CAM boot OT Short Term Goal 2 (Week 2): Pt will be CGA for toilet and shower bench transfer with DME and grab bars OT Short Term Goal 3 (Week 2): Pt will stand at counter or sink up to 4 min on L LE with NWB on R LE for simple functional task OT Short Term Goal 4 (Week 2): Family will be indep with all aspects of functional routine for self care and mobility in preparation for safe d/c home  Skilled Therapeutic Interventions/Progress Updates:  Pt seen for skilled OT session this AM with focus on training daughter Monique Ballard on more complex home transfers including bed and sofa as well as progression of unilateral LE standing tolerance. Dtr Monique Ballard extremely involved and supportive in her mom's care and safety training. OT requested to have husband Monique Ballard in for a session as well prior to d/c home on Friday and both dtr and pt in agreement. Pt able to self propel manual w/c 50-75 ft intervals with rests between and headed to demo apartment and participated in w/c to and from sofa x 2 with adaptations for firmness and height adjustment and w/c to and from regular full bed with bed cane assist rail x 2. Dtr Monique Ballard educated in strategies for lowering bed at home and transfers to ensure NWB R LE is maintaining. Dtr able to teach back and demonstrate understanding. Pt finished session on rehab day room/gym space for 4 th of July holiday yard games participation. Pt able to stand x 2 sets for corn hole/bean bag target toss with RW for unilateral support and excellent follow thru of NWB R LE with cues and CGA. Left pt in the care of dtr who was continuing with games with pt  w/c seated level and would transport pt back to room after completion.   Therapy Documentation Precautions:  Precautions Precautions: Fall Precaution Comments: watch vitals (orthostatic & hypoxic earlier in admission) Restrictions Weight Bearing Restrictions: Yes RLE Weight Bearing: Non weight bearing    Therapy/Group: Individual Therapy  Barnabas Lister 07/24/2021, 12:22 PM

## 2021-07-24 NOTE — Progress Notes (Signed)
Patient ID: Monique Ballard, female   DOB: 02/20/1940, 80 y.o.   MRN: [id number] Met with pt to update regarding team conference progress toward her goals of supervision wheelchair level and discharge still 7/7. She feels she is doing well and is ready to go home this Friday. Her daughter was here earlier and spoke with her she is feeling good about discharge and has been checked off to transfer Mom. Pt's husband to come in tomorrow or Thursday to attend therapies also. Pt has received her equipment and most has been taken home. Do want [facility] to provide home health therapies. Will touch base with Jennifer-liaison for [facility]. Work toward discharge Friday 

## 2021-07-24 NOTE — Progress Notes (Signed)
Physical Therapy Session Note  Patient Details  Name: Monique Ballard MRN: [id number] Date of Birth: [birth year]  Today's Date: 07/24/2021 PT Individual Time: 1415-1530 PT Individual Time Calculation (min): 75 min   Short Term Goals: Week 1:  PT Short Term Goal 1 (Week 1): Pt will perform bed to wc to bed w/supervision via lateral scoot PT Short Term Goal 2 (Week 1): Pt will perform sit to stand w/RW from standard height w/min to light mod assist maintaining NWB RLE PT Short Term Goal 3 (Week 1): Pt will ambulate 103f w/LRAD and min assist, adhering to [place] PT Short Term Goal 4 (Week 1): pt will propel wc 1068fw/supervision  Skilled Therapeutic Interventions/Progress Updates:    Pt received in bathroom with her daughter completing toileting. Hand hygiene in sitting at w/c level. Pt's daughter brought in photos of home set up. Extended time discussing home set up and d/c plan. Also noted that the w/c that was delivered is too large for both pt and home set up. This therapist reached out to [facility] address. Pt propelled w/c with BUE >300 ft with supervision, needing minimal cueing for setting up transfer. Practiced transfer to mat table at height of pt's bed, 29". Attempted several ways for problem solving. Pt was best able to perform with RW Stand pivot transfer >bed and modified squat pivot with assist back to w/c. Pt also performed in room at bedside, educating family members on technique and options. Pt may also be able to use bedrail to assist similar to grab bar in bathroom. Pt remained in w/c and was left with all needs in reach and alarm active.    Therapy Documentation Precautions:  Precautions Precautions: Fall Precaution Comments: watch vitals (orthostatic & hypoxic earlier in admission) Restrictions Weight Bearing Restrictions: Yes RLE Weight Bearing: Non weight bearing (Pt with difficulty maintaining NWB precautions.) General:       Therapy/Group: Individual  Therapy  OlMickel Fuchs/04/2021, 3:43 PM

## 2021-07-24 NOTE — Progress Notes (Signed)
PROGRESS NOTE   Subjective/Complaints: Pt reports LBM just now.   Denies any issues- can't wait til d/c on Friday.  No issues.    ROS:  Pt denies SOB, abd pain, CP, N/V/C/D, and vision changes  Objective:   No results found.  No results for input(s): "WBC", "HGB", "HCT", "PLT" in the last 72 hours.  No results for input(s): "NA", "K", "CL", "CO2", "GLUCOSE", "BUN", "CREATININE", "CALCIUM" in the last 72 hours.   Intake/Output Summary (Last 24 hours) at 07/24/2021 0808 Last data filed at 07/24/2021 0805 Gross per 24 hour  Intake 1200 ml  Output --  Net 1200 ml        Physical Exam: Vital Signs Blood pressure 126/73, pulse 75, temperature 98.1 F (36.7 C), temperature source Oral, resp. rate 14, height '5\' 3"'$  (1.6 m), weight 56.5 kg, last menstrual period 01/22/1992, SpO2 97 %.    General: awake, alert, appropriate, sitting up in bed; just got back from bathroom, BM; NAD HENT: conjugate gaze; oropharynx moist CV: regular rate; no JVD Pulmonary: CTA B/L; no W/R/R- good air movement GI: soft, NT, ND, (+)BS Psychiatric: appropriate- flat but pleasant Neurological: poor memory-  Musculoskeletal:     Comments: RLE with CAM boot/ splint bruising at right toes - entire foot bruised on R- evolving bruises Skin:    General: Skin is warm and dry.  Neurological:     Mental Status: She is alert and oriented to person, place, and time.     Sensory: No sensory deficit.     Motor: Weakness present.     Gait: Gait abnormal.     Comments: 5/5 in BUE 4/5 B HF and KE, L DF PF, RLE in splint   Sensation intact to LT   Assessment/Plan: 1. Functional deficits which require 3+ hours per day of interdisciplinary therapy in a comprehensive inpatient rehab setting. Physiatrist is providing close team supervision and 24 hour management of active medical problems listed below. Physiatrist and rehab team continue to assess barriers  to discharge/monitor patient progress toward functional and medical goals  Care Tool:  Bathing    Body parts bathed by patient: Right arm, Left arm, Chest, Abdomen, Front perineal area, Buttocks, Right upper leg, Left upper leg, Left lower leg, Face   Body parts bathed by helper: Right lower leg Body parts n/a: Right lower leg   Bathing assist Assist Level: Minimal Assistance - Patient > 75%     Upper Body Dressing/Undressing Upper body dressing   What is the patient wearing?: Pull over shirt, Bra    Upper body assist Assist Level: Independent    Lower Body Dressing/Undressing Lower body dressing      What is the patient wearing?: Pants, Incontinence brief     Lower body assist Assist for lower body dressing: Minimal Assistance - Patient > 75%     Toileting Toileting    Toileting assist Assist for toileting: Minimal Assistance - Patient > 75%     Transfers Chair/bed transfer  Transfers assist     Chair/bed transfer assist level: Minimal Assistance - Patient > 75%     Locomotion Ambulation   Ambulation assist      Assist  level: Moderate Assistance - Patient 50 - 74% Assistive device: Parallel bars Max distance: 4   Walk 10 feet activity   Assist  Walk 10 feet activity did not occur: Safety/medical concerns (per PT eval note, ambulation only in parallel bars 4 feet.)        Walk 50 feet activity   Assist Walk 50 feet with 2 turns activity did not occur: Safety/medical concerns         Walk 150 feet activity   Assist Walk 150 feet activity did not occur: Safety/medical concerns         Walk 10 feet on uneven surface  activity   Assist Walk 10 feet on uneven surfaces activity did not occur: Safety/medical concerns         Wheelchair     Assist Is the patient using a wheelchair?: Yes Type of Wheelchair: Manual    Wheelchair assist level: Minimal Assistance - Patient > 75% Max wheelchair distance: 120'    Wheelchair  50 feet with 2 turns activity    Assist        Assist Level: Minimal Assistance - Patient > 75%   Wheelchair 150 feet activity     Assist      Assist Level: Maximal Assistance - Patient 25 - 49%   Blood pressure 126/73, pulse 75, temperature 98.1 F (36.7 C), temperature source Oral, resp. rate 14, height '5\' 3"'$  (1.6 m), weight 56.5 kg, last menstrual period 01/22/1992, SpO2 97 %.  Medical Problem List and Plan: 1. Functional deficits secondary to debility after PFO closure with hx recent R Fibular fx NWB x 6wk             -patient may  shower             -ELOS/Goals: 7-10d  Sup   Con't CIR- PT and OT- NWB on RLE x6 weeks  D/c 07/27/21  Con't CIR- PT, and OT- team conference today to f/u on progress/finalize d/c plans.  2.  Antithrombotics: -DVT/anticoagulation:  Pharmaceutical: Lovenox             -antiplatelet therapy: ASA and Clopidigrel   3. Pain Management: tylenol;tramadol  7/4- pt not needing pain meds- con't prns 4. Mood/Sleep: trazodone             -antipsychotic agents: NA 5. Neuropsych/cognition: This patient is  capable of making decisions on her own behalf. 6. Skin/Wound Care: [facility] for MASD  6/26- severe per nursing- wasn't able to assess- if doesn't improve, will give a few days of Diflucan  6/29- doing a little better- con't regimen 7. Fluids/Electrolytes/Nutrition: BMET in am , monitor K+      Latest Ref Rng & Units 07/18/2021    5:41 AM 07/17/2021    6:30 AM 07/15/2021    3:08 PM  BMP  Glucose 70 - 99 mg/dL 100  99    BUN 8 - 23 mg/dL 6  7    Creatinine 0.44 - 1.00 mg/dL 0.61  0.68  0.66   Sodium 135 - 145 mmol/L 138  137    Potassium 3.5 - 5.1 mmol/L 3.9  4.6    Chloride 98 - 111 mmol/L 109  108    CO2 22 - 32 mmol/L 23  25    Calcium 8.9 - 10.3 mg/dL 8.7  8.9      8.  PFO closed per cardiology Dr Einar Gip 9.  R fibular fx in [place], NWB x  6 wks , f/u [facility]  after DC  7/4- usually sticks to NWB precautions 10.  Sinus tach on BB 11.   Polycythemia CBC in am  12.  UTI >100K Pseudomonas A, Cefepime IV per pharm  6/26- Dr Dwyane Dee suggested switching to Cipro 500 mg BID x 5 days- have done so and stopped Cefepime- will recheck labs in AM  6/27- WBC bounced up very slightly to 11.2 from 10.7- will recheck again in Am to make sure heading in right direction  6/28- since WBC 10.8- afebrile- will con't Cipro and monitor clinically for Sx's- did need in/out cath x1 last night- having some frequency still. Will do 7 days since still strongly (+) U/A  6/29- no Sx's of UTI anymore and no cath required-  13. Hx ETOH use - none currently- cessation education  14. Hypotension  6/27- BP 80/52 this AM- even with recheck- sitting up- will order TEDs- advised pt on drinking enough fluids- at least 6-8 cups water/day- she admits hasn't been drinking enough- held B Blocker this AM  6/28 - Lopressor held again last night- BP 338V-291B systolic- depending on BP prior to lorpessor dose, might hold again- d/w nursing.   6/29- BP 98/63 this AM- but up to 146/79 this Am as well.   6/30- BP 100/65- no Sx's of hypotension- con't regimen  7/1- BP a little elevated, but lopressor was held- will reduce Lopressor to 12.5 mg BID so might need to be held so much.   7/2- lopressor still held due to BP in 80s-90s last night- would stop, however when it's held, HR goes >100 frequently.  Vitals:   07/24/21 0512 07/24/21 0742  BP: (!) 150/90 126/73  Pulse: 72 75  Resp: 14   Temp: 98.1 F (36.7 C)   SpO2: 97%    Some BP lability but HR <100BPM   7/4- BP overall much better and HR 75- con't to monitor and con't low dose metoprolol.  15. Poor memory  7/2- Will check thiamine level on Monday 16. Dispo  7/2- will allow grounds pass with family.   I spent a total of  35  minutes on total care today- >50% coordination of care- due to team conference and d/w nursing about BM      LOS: 9 days A FACE TO FACE EVALUATION WAS PERFORMED  Nishka Heide 07/24/2021,  8:08 AM

## 2021-07-25 NOTE — Progress Notes (Signed)
Physical Therapy Weekly Progress Note  Patient Details  Name: Monique Ballard MRN: [id number] Date of Birth: 10-Nov-1940  Beginning of progress report period: July 16, 2021 End of progress report period: July 25, 2021  Today's Date: 07/25/2021   Patient has met 3 of 4 short term goals.  Pt has made good progress during current week of therapy. Pt is consistently supervision with bed mobility and w/c mobilty. Pt has had greatest difficulty maintaining NWB on RLE for both transfers and initiation of gait activities. Pt initially was unable to maintain NWB restrictions however more recently is able to maintain during squat pivot transfers and today was able to hop forward/backwards maintaining restriction. Pt's daughter has been present for several therapy sessions and education has been ongoing regarding hands on education.   Patient continues to demonstrate the following deficits muscle weakness and decreased awareness and decreased safety awareness and therefore will continue to benefit from skilled PT intervention to increase functional independence with mobility.  Patient progressing toward long term goals..  Continue plan of care.  PT Short Term Goals Week 1:  PT Short Term Goal 1 (Week 1): Pt will perform bed to wc to bed w/supervision via lateral scoot PT Short Term Goal 1 - Progress (Week 1): Met PT Short Term Goal 2 (Week 1): Pt will perform sit to stand w/RW from standard height w/min to light mod assist maintaining NWB RLE PT Short Term Goal 2 - Progress (Week 1): Met PT Short Term Goal 3 (Week 1): Pt will ambulate 47f w/LRAD and min assist, [place] to [place] PT Short Term Goal 3 - Progress (Week 1): Progressing toward goal PT Short Term Goal 4 (Week 1): pt will propel wc [id number]/supervision PT Short Term Goal 4 - Progress (Week 1): Met Week 2:     Skilled Therapeutic Interventions/Progress Updates:      Therapy Documentation Precautions:  Precautions Precautions:  Fall Precaution Comments: watch vitals (orthostatic & hypoxic earlier in admission) Restrictions Weight Bearing Restrictions: Yes RLE Weight Bearing: Non weight bearing General:     Therapy/Group: Individual Therapy  Rosita DeChalus 07/25/2021, 4:32 PM

## 2021-07-25 NOTE — Discharge Instructions (Addendum)
Inpatient Rehab Discharge Instructions  Monique Ballard Discharge date and time: 07/27/2021  Activities/Precautions/ Functional Status: Activity: no lifting, driving, or strenuous exercise until cleared by MD Diet: cardiac diet Wound Care: keep wound clean and dry Functional status:  ___ No restrictions     ___ Walk up steps independently ___ 24/7 supervision/assistance   ___ Walk up steps with assistance __x_ Intermittent supervision/assistance  ___ Bathe/dress independently ___ Walk with walker     ___ Bathe/dress with assistance ___ Walk Independently    ___ Shower independently ___ Walk with assistance    __x_ Shower with assistance __x_ No alcohol     ___ Return to work/school ________  Special Instructions:  Endocarditis prophylaxis for 6 months for dental procedures.  No driving, alcohol consumption or tobacco use.    COMMUNITY REFERRALS UPON DISCHARGE:    Home Health:   PT  OT  AIDE                 [place]   Phone: [phone]  Medical Equipment/Items Ordered:WHEELCHAIR AND 3 IN 1 WILL GET TUB BENCH ON OWN                                                 Agency/Supplier:[facility]  [phone]    My questions have been answered and I understand these instructions. I will adhere to these goals and the provided educational materials after my discharge from the hospital.  Patient/Caregiver Signature _______________________________ Date __________  Clinician Signature _______________________________________ Date __________  Please bring this form and your medication list with you to all your follow-up doctor's appointments.

## 2021-07-25 NOTE — Progress Notes (Signed)
Physical Therapy Session Note  Patient Details  Name: Monique Ballard MRN: [id number] Date of Birth: 07/22/1940  Today's Date: 07/25/2021 PT Individual Time: 0805-0915 PT Individual Time Calculation (min): 70 min   Short Term Goals: Week 1:  PT Short Term Goal 1 (Week 1): Pt will perform bed to wc to bed w/supervision via lateral scoot PT Short Term Goal 2 (Week 1): Pt will perform sit to stand w/RW from standard height w/min to light mod assist maintaining NWB RLE PT Short Term Goal 3 (Week 1): Pt will ambulate 45f w/LRAD and min assist, adhering to [place] PT Short Term Goal 4 (Week 1): pt will propel wc [id number]/supervision  Skilled Therapeutic Interventions/Progress Updates: Pt presented in bed agreeable to therapy. Pt denies pain during session. Pt able to doff nightshirt and don bra and top with set up assist. Pt then transferred to supine with PTA doffing CAM boot and pt able to thread pants EOB with supervision. Pt attempted to stand prior to PTA placing CAM boot back on as well as attempted to place RLE on ground. PTA re-oriented pt to NWB precautions with pt able to perform Sit to stand with minA from elevated bed to allow PTA pt pull pants over hips. Upon standing pt indicating urgent need for BR. Pt performed squat pivot to w/c with minA to maintain NWB precautions and transported to toilet. Possibly due to urgency pt performed squat pivot to toilet with modA as pt unable to maintain NWB precautions. Pt with incontinent BM, continent urinary void and pt with additional BM to empty bowels with pt performing peri-care with supervision in sitting and PTA using washcloth to ensure cleanliness and apply butt cream. With use of wall rail pt was able to perform stand this time maintaining NWB precautions. Pt returned to w/c via squat pivot with minA and transported to sink to perform hand and oral hygiene. Pt then propelled to rehab gym with supervision and performed squat pivot to 24in mat  with CGA and PTA supporting RLE to maintain NWB. Pt then performed Sit to stand x 5 from 24 and 22in height for LLE strengthening. With PTA supporting RLE pt was able to perform with CGA. PTA then used leg lifter to maintain NWB and pt was able to take x 2 hops forward/backward with RW and CGA maintaining NWB! Pt then participated in block practice stand pivot transfers between 28-29in height mat and w/c. Pt was able to maintain NWB initially with PTA placing foot under CAM boot then was able to progress to only verbal cues. Pt was unable to truly hop to turn but was able to perform heel/toe alternating shift to turn between mat and w/c. Pt then propelled back to room with supervision and agreeable to remain in w/c. Pt left in w/c at end of session with bed alarm on, call bell within reach and needs met.      Therapy Documentation Precautions:  Precautions Precautions: Fall Precaution Comments: watch vitals (orthostatic & hypoxic earlier in admission) Restrictions Weight Bearing Restrictions: Yes RLE Weight Bearing: Non weight bearing General:   Vital Signs: Therapy Vitals Temp: 98.2 F (36.8 C) Pulse Rate: 77 Resp: 16 BP: 108/70 Patient Position (if appropriate): Lying Oxygen Therapy SpO2: 95 % O2 Device: Room Air Pain: Pain Assessment Pain Scale: 0-10 Pain Score: 0-No pain Faces Pain Scale: No hurt Mobility:   Locomotion :    Trunk/Postural Assessment :    Balance:   Exercises:   Other Treatments:  Therapy/Group: Individual Therapy  Tsering Leaman 07/25/2021, 1:02 PM

## 2021-07-25 NOTE — Plan of Care (Signed)
  Problem: RH KNOWLEDGE DEFICIT GENERAL Goal: RH STG INCREASE KNOWLEDGE OF SELF CARE AFTER HOSPITALIZATION Description: Patient will demonstrate knowledge of self-care management, medication management, weight bearing precautions with educational materials and handouts provided by staff independently at discharge. Outcome: Not Progressing   Problem: RH SAFETY Goal: RH STG DECREASED RISK OF FALL WITH ASSISTANCE Description: STG Decreased Risk of Fall With Supervision Assistance. Outcome: Not Progressing

## 2021-07-25 NOTE — Progress Notes (Signed)
PROGRESS NOTE   Subjective/Complaints:  Pt reports LBM yesterday AM- slept well Denies issues   ROS: Limited by cognition, but denies any issues Objective:   No results found.  No results for input(s): "WBC", "HGB", "HCT", "PLT" in the last 72 hours.  No results for input(s): "NA", "K", "CL", "CO2", "GLUCOSE", "BUN", "CREATININE", "CALCIUM" in the last 72 hours.   Intake/Output Summary (Last 24 hours) at 07/25/2021 1111 Last data filed at 07/25/2021 0730 Gross per 24 hour  Intake 560 ml  Output --  Net 560 ml        Physical Exam: Vital Signs Blood pressure (!) 149/81, pulse 74, temperature 98.1 F (36.7 C), resp. rate 14, height '5\' 3"'$  (1.6 m), weight 56.5 kg, last menstrual period 01/22/1992, SpO2 95 %.     General: awake, alert, appropriate, sitting up in bed watching TV; NAD HENT: conjugate gaze; oropharynx moist CV: regular rate; no JVD Pulmonary: CTA B/L; no W/R/R- good air movement GI: soft, NT, ND, (+)BS Psychiatric: appropriate- not focused on conversation Neurological: poor memory- alert Musculoskeletal:     Comments: RLE with CAM boot/ splint bruising at right toes - entire foot bruised on R- evolving bruises Skin:    General: Skin is warm and dry.  Neurological:     Mental Status: She is alert and oriented to person, place, and time.     Sensory: No sensory deficit.     Motor: Weakness present.     Gait: Gait abnormal.     Comments: 5/5 in BUE 4/5 B HF and KE, L DF PF, RLE in splint   Sensation intact to LT   Assessment/Plan: 1. Functional deficits which require 3+ hours per day of interdisciplinary therapy in a comprehensive inpatient rehab setting. Physiatrist is providing close team supervision and 24 hour management of active medical problems listed below. Physiatrist and rehab team continue to assess barriers to discharge/monitor patient progress toward functional and medical  goals  Care Tool:  Bathing    Body parts bathed by patient: Right arm, Left arm, Chest, Abdomen, Front perineal area, Buttocks, Right upper leg, Left upper leg, Left lower leg, Face   Body parts bathed by helper: Right lower leg Body parts n/a: Right lower leg   Bathing assist Assist Level: Minimal Assistance - Patient > 75%     Upper Body Dressing/Undressing Upper body dressing   What is the patient wearing?: Pull over shirt, Bra    Upper body assist Assist Level: Independent    Lower Body Dressing/Undressing Lower body dressing      What is the patient wearing?: Pants, Incontinence brief     Lower body assist Assist for lower body dressing: Minimal Assistance - Patient > 75%     Toileting Toileting    Toileting assist Assist for toileting: Minimal Assistance - Patient > 75%     Transfers Chair/bed transfer  Transfers assist     Chair/bed transfer assist level: Minimal Assistance - Patient > 75%     Locomotion Ambulation   Ambulation assist      Assist level: Moderate Assistance - Patient 50 - 74% Assistive device: Parallel bars Max distance: 4   Walk 10  feet activity   Assist  Walk 10 feet activity did not occur: Safety/medical concerns (per PT eval note, ambulation only in parallel bars 4 feet.)        Walk 50 feet activity   Assist Walk 50 feet with 2 turns activity did not occur: Safety/medical concerns         Walk 150 feet activity   Assist Walk 150 feet activity did not occur: Safety/medical concerns         Walk 10 feet on uneven surface  activity   Assist Walk 10 feet on uneven surfaces activity did not occur: Safety/medical concerns         Wheelchair     Assist Is the patient using a wheelchair?: Yes Type of Wheelchair: Manual    Wheelchair assist level: Minimal Assistance - Patient > 75% Max wheelchair distance: 120'    Wheelchair 50 feet with 2 turns activity    Assist        Assist  Level: Minimal Assistance - Patient > 75%   Wheelchair 150 feet activity     Assist      Assist Level: Maximal Assistance - Patient 25 - 49%   Blood pressure (!) 149/81, pulse 74, temperature 98.1 F (36.7 C), resp. rate 14, height '5\' 3"'$  (1.6 m), weight 56.5 kg, last menstrual period 01/22/1992, SpO2 95 %.  Medical Problem List and Plan: 1. Functional deficits secondary to debility after PFO closure with hx recent R Fibular fx NWB x 6wk             -patient may  shower             -ELOS/Goals: 7-10d  Sup   Con't CIR- PT and OT- NWB on RLE x6 weeks  D/c 07/27/21  Con't CIR- PT, OT and SLP 2.  Antithrombotics: -DVT/anticoagulation:  Pharmaceutical: Lovenox             -antiplatelet therapy: ASA and Clopidigrel   3. Pain Management: tylenol;tramadol  7/4- pt not needing pain meds- con't prns 4. Mood/Sleep: trazodone             -antipsychotic agents: NA 5. Neuropsych/cognition: This patient is  capable of making decisions on her own behalf. 6. Skin/Wound Care: [facility] for MASD  6/26- severe per nursing- wasn't able to assess- if doesn't improve, will give a few days of Diflucan  6/29- doing a little better- con't regimen  7/5- doing MUCH better- almost healed- con't regimen 7. Fluids/Electrolytes/Nutrition: BMET in am , monitor K+      Latest Ref Rng & Units 07/18/2021    5:41 AM 07/17/2021    6:30 AM 07/15/2021    3:08 PM  BMP  Glucose 70 - 99 mg/dL 100  99    BUN 8 - 23 mg/dL 6  7    Creatinine 0.44 - 1.00 mg/dL 0.61  0.68  0.66   Sodium 135 - 145 mmol/L 138  137    Potassium 3.5 - 5.1 mmol/L 3.9  4.6    Chloride 98 - 111 mmol/L 109  108    CO2 22 - 32 mmol/L 23  25    Calcium 8.9 - 10.3 mg/dL 8.7  8.9      8.  PFO closed per cardiology Dr Einar Gip 9.  R fibular fx in [place], NWB x  6 wks , f/u [facility] after DC  7/4- usually sticks to NWB precautions 10.  Sinus tach on BB 11.  Polycythemia CBC in am  12.  UTI >100K Pseudomonas A, Cefepime IV per pharm  6/26- Dr  Dwyane Dee suggested switching to Cipro 500 mg BID x 5 days- have done so and stopped Cefepime- will recheck labs in AM  6/27- WBC bounced up very slightly to 11.2 from 10.7- will recheck again in Am to make sure heading in right direction  6/28- since WBC 10.8- afebrile- will con't Cipro and monitor clinically for Sx's- did need in/out cath x1 last night- having some frequency still. Will do 7 days since still strongly (+) U/A  6/29- no Sx's of UTI anymore and no cath required-  13. Hx ETOH use - none currently- cessation education  14. Hypotension  6/27- BP 80/52 this AM- even with recheck- sitting up- will order TEDs- advised pt on drinking enough fluids- at least 6-8 cups water/day- she admits hasn't been drinking enough- held B Blocker this AM  6/28 - Lopressor held again last night- BP [id number] systolic- depending on BP prior to lorpessor dose, might hold again- d/w nursing.   6/29- BP 98/63 this AM- but up to 146/79 this Am as well.   6/30- BP 100/65- no Sx's of hypotension- con't regimen  7/1- BP a little elevated, but lopressor was held- will reduce Lopressor to 12.5 mg BID so might need to be held so much.   7/2- lopressor still held due to BP in 80s-90s last night- would stop, however when it's held, HR goes >100 frequently.  Vitals:   07/24/21 1956 07/25/21 0546  BP: 121/90 (!) 149/81  Pulse: 93 74  Resp: 14 14  Temp: 98.5 F (36.9 C) 98.1 F (36.7 C)  SpO2: 97% 95%   Some BP lability but HR <100BPM   7/4- BP overall much better and HR 75- con't to monitor and con't low dose metoprolol.   7/5- BP a little elevated, but pulse still in 70s- con't reigmen 15. Poor memory  7/2- Will check thiamine level on Monday 16. Dispo  7/2- will allow grounds pass with family.       LOS: 10 days A FACE TO FACE EVALUATION WAS PERFORMED  Monique Ballard 07/25/2021, 11:11 AM

## 2021-07-25 NOTE — Progress Notes (Signed)
Occupational Therapy Session Note  Patient Details  Name: Monique Ballard MRN: [id number] Date of Birth: 10/06/1940  Today's Date: 07/25/2021 OT Individual Time: 1030-1130 1st session; 1415-1530 2nd session  OT Individual Time Calculation (min): 60 min 1st session, 75 min 2nd session    Short Term Goals: Week 2:  OT Short Term Goal 1 (Week 2): Pt will require CGA for all LB self care using AE excluding TEDS and R CAM boot OT Short Term Goal 2 (Week 2): Pt will be CGA for toilet and shower bench transfer with DME and grab bars OT Short Term Goal 3 (Week 2): Pt will stand at counter or sink up to 4 min on L LE with NWB on R LE for simple functional task OT Short Term Goal 4 (Week 2): Family will be indep with all aspects of functional routine for self care and mobility in preparation for safe d/c home  Skilled Therapeutic Interventions/Progress Updates:  1st session:OT session with focus on Family educ with pt and dtr Monique Ballard for full am toileting, shower and dressing routine. Pt reports no pain at start and throughout session. Pt transferred to and from 3 in 1 commode over toilet via SPT with unilateral L LE WB only with support of grab bar with CGA and mod cues. Doffed clothing with min a due to CAM boot then transferred back to w/c to move to shower bench with bar with CGA and mod cues via squat pivot. Pt able to bathe UB with set up and S and LB with min A with lateral leans for buttocks and peri area. Dried with min A overall. Dressing sink side with set up and close S for bra, pullover shirt and min A for LB garments and incontinence brief. Dtr was able to demo and participate in all of session with good carryover and understanding. Left pt up in w/c with dtr present and call bell and needs within reach and chair alarm active.   2nd session: Dtr Monique Ballard and pt's husband [name] present for further Family educ this session. Focus on toileting safety, transfers w/c to and from toilet with 3 in 1 over  top with bars, sofa, regular bed with rail and car. Utilized toilet for BM and voiding with dtr and husband present for training for peri hygiene and incontinence brief management strategies. OT transported pt to demo apartment and participated in w/c to and from sofa x 1 with adaptations and w/c to and from regular full bed with bed cane assist rail x 1 with demo set up for 3 in 1 commode use and safety for at night time. Dtr filmed transfers for husband's carryover. Then training completed for car transfer set to dtr's [employer] RAV 4 31 in and pt required min A to complete with mod cues for NWB prec and techniques. Husband and dtr able to teach back and demonstrate understanding. Once back in room, pt left up in w/c with chair alarm active and family continuing to visit.   Therapy Documentation Precautions:  Precautions Precautions: Fall Precaution Comments: watch vitals (orthostatic & hypoxic earlier in admission) Restrictions Weight Bearing Restrictions: Yes RLE Weight Bearing: Non weight bearing    Therapy/Group: Individual Therapy  Barnabas Lister 07/25/2021, 4:04 PM

## 2021-07-26 NOTE — Progress Notes (Signed)
Continue to educate patient on NWB RLE. Continue to reposition while up in chair. Patient has no questions regarding being DC tomorrow. Patient states her daughter is a PT and she will be helping her transition back home

## 2021-07-26 NOTE — Progress Notes (Signed)
Physical Therapy Session Note  Patient Details  Name: Monique Ballard MRN: [id number] Date of Birth: 07-08-1940  Today's Date: 07/26/2021 PT Individual Time: 0805-0915 PT Individual Time Calculation (min): 70 min   Short Term Goals: Week 1:  PT Short Term Goal 1 (Week 1): Pt will perform bed to wc to bed w/supervision via lateral scoot PT Short Term Goal 1 - Progress (Week 1): Met PT Short Term Goal 2 (Week 1): Pt will perform sit to stand w/RW from standard height w/min to light mod assist maintaining NWB RLE PT Short Term Goal 2 - Progress (Week 1): Met PT Short Term Goal 3 (Week 1): Pt will ambulate 27f w/LRAD and min assist, adhering to [place] PT Short Term Goal 3 - Progress (Week 1): Progressing toward goal PT Short Term Goal 4 (Week 1): pt will propel wc [id number]/supervision PT Short Term Goal 4 - Progress (Week 1): Met Week 2:     Skilled Therapeutic Interventions/Progress Updates: Pt presented in bed agreeable to therapy. Pt denies pain during session. Session focused on functional mobility in preparation for d/c. Pt doffed nightshirt and donned bra/top bed level with set up. Performed supine to sit mod I and performed lateral leans to doff pants, PTA threaded pants total A for time management and pt was able to perform lateral leans to pull pants over hips. Pt indicated need for bathroom. Performed squat pivot with CGA and cues to maintain NWB to w/c. Pt moved to toilet and used wall rail to transfer to toilet with max cues to maintain NWB (+urinary void/BM). PTA noted that when pt has increased urgency decreased adherence to weight bearing precautions. Due to urgency PTA assisted with clothing management. Pt returned to w/c in same manner as prior and transferred to sink to complete hand hygiene. Pt then transported to ortho gym and performed stand pivot transfer to SUV height vehicle with light minA. Pt was CGA when provided with increased verbal cues to return to w/c. Pt propelled  to ADL apt and performed stand pivot transfer to bed with CGA and max cues for weight bearing precautions. Pt then propelled to day room >[id number] I and pt did not require any breaks. Performed multiple squat pivot transfers between w/c and mat with supervision and with pt maintaining wt bearing precautions. Pt then performed Sit to stand from 20in mat x 5 with RW and CGA, with verbal cues pt was able to maintain NWB. Pt then performed stand pivot transfers with pt was able to maintain NWB when provided with consistent cues. Pt attempted forward hops x 5 with RW which pt was able to maintain however pt began placing RLE on ground when attempting to turn towards w/c. Pt propelled back to room mod I and remained in w/c with belt alarm on, call bell within reach and needs met.      Therapy Documentation Precautions:  Precautions Precautions: Fall Precaution Comments: watch vitals (orthostatic & hypoxic earlier in admission) Restrictions Weight Bearing Restrictions: Yes RLE Weight Bearing: Non weight bearing General:   Vital Signs:   Pain: Pain Assessment Pain Scale: 0-10 Pain Score: 0-No pain Mobility: Bed Mobility Supine to Sit: Independent with assistive device Sit to Supine: Independent with assistive device Transfers Transfers: Sit to Stand;Stand to Sit;Stand Pivot Transfers Sit to Stand: Contact Guard/Touching assist Stand to Sit: Contact Guard/Touching assist Stand Pivot Transfers: Contact Guard/Touching assist Stand Pivot Transfer Details: Verbal cues for precautions/safety Lateral/Scoot Transfers: Contact Guard/Touching assist Transfer (Assistive device): Rolling  walker Locomotion : Gait Ambulation: Yes Gait Assistance: Minimal Assistance - Patient > 75% Gait Distance (Feet): 3 Feet Assistive device: Rolling walker  Trunk/Postural Assessment :    Balance:   Exercises:   Other Treatments:      Therapy/Group: Individual Therapy  Lareta Bruneau 07/26/2021, 12:45 PM

## 2021-07-26 NOTE — Progress Notes (Signed)
Inpatient Rehabilitation Discharge Medication Review by a Pharmacist  A complete drug regimen review was completed for this patient to identify any potential clinically significant medication issues.  High Risk Drug Classes Is patient taking? Indication by Medication  Antipsychotic Yes Olanzapine - mood  Anticoagulant No   Antibiotic No   Opioid No   Antiplatelet Yes Asa, Plavix - PFO/ASD  Hypoglycemics/insulin No   Vasoactive Medication Yes Metoprolol - HTN  Chemotherapy No   Other Yes Escitalopram - mood/depression Folic acid, thiamine, MV - supplement Pantoprazole - GERD ppx     Type of Medication Issue Identified Description of Issue Recommendation(s)  Drug Interaction(s) (clinically significant)     Duplicate Therapy     Allergy     No Medication Administration End Date     Incorrect Dose     Additional Drug Therapy Needed     Significant med changes from prior encounter (inform family/care partners about these prior to discharge).    Other       Clinically significant medication issues were identified that warrant physician communication and completion of prescribed/recommended actions by midnight of the next day:  No   Pharmacist comments:  - Aspirin and clopidogrel for 30 days followed by aspirin indefinitely   Time spent performing this drug regimen review (minutes): 20   Thank you for allowing pharmacy to be a part of this patient's care.  Ardyth Harps, PharmD Clinical Pharmacist

## 2021-07-26 NOTE — Progress Notes (Signed)
Occupational Therapy Session Note  Patient Details  Name: Monique Ballard MRN: [id number] Date of Birth: 02-23-1940  Today's Date: 07/26/2021 OT Individual Time: 9628-3662 OT Individual Time Calculation (min): 60 min    Short Term Goals: Week 1:  OT Short Term Goal 1 (Week 2): Pt will require CGA for all LB self care using AE excluding TEDS and R CAM boot OT Short Term Goal 2 (Week 2): Pt will be CGA for toilet and shower bench transfer with DME and grab bars OT Short Term Goal 3 (Week 2): Pt will stand at counter or sink up to 4 min on L LE with NWB on R LE for simple functional task OT Short Term Goal 4 (Week 2): Family will be indep with all aspects of functional routine for self care and mobility in preparation for safe d/c home  Skilled Therapeutic Interventions/Progress Updates:  Pt awake up in w/c upon OT arrival to the room. Pt reports, "I just have a really hard time not putting that (RLE) down." Pt in agreement for OT session. Pt participates in functional transfer training as well as standing balance activities in order to improve ability to maintain NWB to RLE during functional transfers which is needed for optimal healing and safety with ADLs/transfers.   Therapy Documentation Precautions:  Precautions Precautions: Fall Precaution Comments: watch vitals (orthostatic & hypoxic earlier in admission) Restrictions Weight Bearing Restrictions: Yes RLE Weight Bearing: Non weight bearing Vital Signs:  Please see "Flowsheet" for last chart vitals. Pain: Pain Assessment Pain Scale: 0-10 Pain Score: 0-No pain  ADL: ADL's not completed due to focusing on functional mobility and techniques to improve WB adherence during transfers in order to prepare for DC.   Functional Mobility: Pt participates in functional transfer training in order to maintain WB precautions for optimal healing after DC. Education provided to pt on each steps that are involved in a transfer and safe  transfer techniques. This includes scooting to the edge, importance of head-hip relationship ("nose over toes"), kicking RLE out to decrease risks of breaking NWB precautions, pushing up from stable surface, and offloading through BUE when pivoting form w/c <> EOB. Encouraged pt to perform transfers with decreased speed and to break the transfers into the smaller steps (push through BUE, pivot, turn walker). However, pt frequently breaks NWB precautions. OT then provides tactile input under RLE to elevate RLE when pt attempts to WB through RLE, however, this does not improve pt's ability to maintain NWB through RLE. Pt participates in various SPT's from EOB <> w/c x approx 5 reps total with rest breaks in between with pt demo'ing <25% accuracy with maintaining WB precautions.   Education provided to pt on importance of NWB and the progression of WB throughout the healing process. Educated pt that poor adherence to WB precautions can delay WB progression. Pt verbalizes understanding, however, continues to report difficulty with the motor planning aspects in order to maintain WB precautions on RLE.    Pt requested to stay in the w/c at end of session. Pt left sitting comfortably in the w/c with personal belongings and call light within reach, belt alarm placed and activated, and comfort needs attended to.    Therapy/Group: Individual Therapy  Barbee Shropshire 07/26/2021, 10:14 AM

## 2021-07-26 NOTE — Progress Notes (Signed)
Occupational Therapy Discharge Summary  Patient Details  Name: Monique Ballard MRN: [id number] Date of Birth: 1940-02-04  Today's Date: 07/26/2021 OT Individual Time:  -       Patient has met 13 of 13 long term goals due to improved activity tolerance, improved balance, postural control, ability to compensate for deficits, functional use of  RIGHT upper and LEFT upper extremity, improved attention, and improved awareness.  Patient to discharge at [facility] Assist level.  Patient's care partner is independent to provide the necessary physical and cognitive assistance at discharge.    Reasons goals not met: n/a  Recommendation:  Patient will benefit from ongoing skilled OT services in home health setting to continue to advance functional skills in the area of BADL, iADL, and Reduce care partner burden.  Equipment: W/c, cushion, 3 in 1 commode, and LH sponge; pt has RW, rollator and transfer tub bench in home   Reasons for discharge: treatment goals met  Patient/family agrees with progress made and goals achieved: Yes  OT Discharge Precautions/Restrictions  Precautions Precautions: Fall Precaution Comments: watch vitals (orthostatic & hypoxic earlier in admission) Required Braces or Orthoses: Other Brace Other Brace: CAM Restrictions Weight Bearing Restrictions: Yes RLE Weight Bearing: Non weight bearing General   Vital Signs Therapy Vitals Temp: 98.3 F (36.8 C) Temp Source: Oral Pulse Rate: 78 Resp: 16 BP: 140/78 (rechecked) Patient Position (if appropriate): Sitting Oxygen Therapy SpO2: 95 % Pain Pain Assessment Pain Scale: 0-10 Pain Score: 0-No pain ADL ADL Equipment Provided: Long-handled sponge Eating: Independent Where Assessed-Eating: Chair Grooming: Independent Where Assessed-Grooming: Sitting at sink Upper Body Bathing: Independent Where Assessed-Upper Body Bathing: Sitting at sink Lower Body Bathing: Contact guard Where Assessed-Lower Body  Bathing: Shower, Sitting at sink, Standing at sink Upper Body Dressing: Independent Where Assessed-Upper Body Dressing: Sitting at sink Lower Body Dressing: Minimal assistance Where Assessed-Lower Body Dressing: Sitting at sink, Standing at sink Toileting: Minimal assistance Where Assessed-Toileting: Glass blower/designer: Therapist, music Method: Arts development officer: Energy manager: Contact guard, Minimal cueing Tub/Shower Equipment: Facilities manager: Curator Method: Radiographer, therapeutic: Civil engineer, contracting with back Vision Baseline Vision/History: 1 Wears glasses Patient Visual Report: No change from baseline Vision Assessment?: Yes;No apparent visual deficits Ocular Range of Motion: Within Functional Limits Alignment/Gaze Preference: Within Defined Limits Tracking/Visual Pursuits: Able to track stimulus in all quads without difficulty Saccades: Within functional limits Convergence: Within functional limits Perception  Perception: Within Functional Limits Praxis Praxis: Intact Cognition Cognition Overall Cognitive Status: Within Functional Limits for tasks assessed Arousal/Alertness: Awake/alert Orientation Level: Person;Place;Situation Person: Oriented Place: Oriented Situation: Oriented Memory: Impaired Memory Impairment: Decreased short term memory;Decreased long term memory;Decreased recall of new information Decreased Long Term Memory: Functional basic Decreased Short Term Memory: Functional basic Attention: Selective Selective Attention: Appears intact Awareness: Impaired Awareness Impairment: Anticipatory impairment Problem Solving: Impaired Problem Solving Impairment: Functional basic Behaviors: Impulsive Safety/Judgment: Impaired Brief Interview for Mental Status (BIMS) Repetition of Three Words (First Attempt): 3 Temporal Orientation: Year:  Correct Temporal Orientation: Month: Accurate within 5 days Temporal Orientation: Day: Correct Recall: "Sock": Yes, no cue required Recall: "Blue": Yes, no cue required Recall: "Bed": Yes, no cue required BIMS Summary Score: 15 Sensation Sensation Light Touch: Appears Intact Hot/Cold: Appears Intact Proprioception: Appears Intact Stereognosis: Appears Intact Coordination Gross Motor Movements are Fluid and Coordinated: No Fine Motor Movements are Fluid and Coordinated: No Coordination and Movement Description: NWB status Finger Nose  Finger Test: mild overshoots Motor  Motor Motor: Within Functional Limits Motor - Skilled Clinical Observations: NWB RLE due to fibular fx, immobilized in boot Motor - Discharge Observations: RLE NWB Mobility  Bed Mobility Bed Mobility: Rolling Right;Rolling Left;Supine to Sit;Sit to Supine Rolling Right: Independent Rolling Left: Independent Supine to Sit: Independent with assistive device Sit to Supine: Independent with assistive device Transfers Sit to Stand: Contact Guard/Touching assist Stand to Sit: Contact Guard/Touching assist  Trunk/Postural Assessment  Cervical Assessment Cervical Assessment: Within Functional Limits Thoracic Assessment Thoracic Assessment: Within Functional Limits Lumbar Assessment Lumbar Assessment: Within Functional Limits Postural Control Postural Control: Deficits on evaluation Righting Reactions: diminished  Balance Balance Balance Assessed: Yes Dynamic Sitting Balance Dynamic Sitting - Balance Support: Feet supported Dynamic Sitting - Level of Assistance: 6: Modified independent (Device/Increase time) Dynamic Sitting - Balance Activities: Reaching for objects Static Standing Balance Static Standing - Balance Support: Bilateral upper extremity supported Static Standing - Comment/# of Minutes: 2 min Dynamic Standing Balance Dynamic Standing - Balance Support: Bilateral upper extremity supported Dynamic  Standing - Level of Assistance: 4: Min assist Dynamic Standing - Balance Activities: Reaching for objects Extremity/Trunk Assessment RUE Assessment RUE Assessment: Within Functional Limits LUE Assessment LUE Assessment: Within Functional Limits  Pt seen for final skilled OT session. Focussed on all discharge status re-assessment of ADL's including bathing, dressing, toileting and grooming with transfer safety as well as simple seated and standing level light IADL's following NWB LE precautions with cues. Dtr and husband have been in for [facility] Education during full shower and self care, functioanl transfer training to car, tub shower, stall shower, sofa and regular bed as well as standing tolerance in kitchen and simple B UE tband HEP with + teach back and understanding. Pt was able to then self propel w/c ~150 ft x 2 to access the demo apartment therapy space for tub transfer bench training as well as accessing kitchen at w/c level to obtain items in fridge, bridge items along counters and reach for items in upper (while supported standing on L LE only) lower and pantry shelves/cabinets with safe demonstration and S.  Once back in room, pt  performed UE strength training with tbands for UE HEP independently with simple photo illustrations. No further inpt OT needs as all goals met and rec for 24 hr S and HHOT.  Left at end of session in w/c with call bell and safety measures incl chair alarm set and needs in place.   Barnabas Lister 07/26/2021, 9:11 PM

## 2021-07-26 NOTE — Progress Notes (Signed)
Inpatient Rehabilitation Care Coordinator Discharge Note   Patient Details  Name: Monique Ballard MRN: [id number] Date of Birth: 1940/06/16   Discharge location: HOME WITH HUSBAND AND SON WITH DAUGHTER CLOSELY CHECKING ON ALL  Length of Stay: 12 DAYS  Discharge activity level: CGA-MIN ASSIST WHEELCHAIR LEVEL DUE TO WB ISSUES  Home/community participation: ACTIVE  Patient response AJ:GOTLXB Literacy - How often do you need to have someone help you when you read instructions, pamphlets, or other written material from your doctor or pharmacy?: Never  Patient response WI:OMBTDH Isolation - How often do you feel lonely or isolated from those around you?: Never  Services provided included: MD, RD, PT, OT, RN, CM, TR, Pharmacy, SW  Financial Services:  Financial Services Utilized: Medicare    Choices offered to/list presented to: PT AND DAUGHTER  Follow-up services arranged:  Home Health, Patient/Family request agency HH/DME, DME Home Health Agency: [place] [facility]-PT,OT AIDE    DME : [facility]-WHEELCHAIR, 3 IN 1 WILL GET TUB BENCH ON OWN AND ALREADY HAS A ROLLING WALKER HH/DME Requested Agency: [place] [facility]  Patient response to transportation need: Is the patient able to respond to transportation needs?: Yes In the past 12 months, has lack of transportation kept you from medical appointments or from getting medications?: No In the past 12 months, has lack of transportation kept you from meetings, work, or from getting things needed for daily living?: No    Comments (or additional information): [place]. ONCE CAN WB WILL DO BETTER AND BE MOBILE. CURRENTLY WHEELCHAIR LEVEL DUE TO NWB STATUS NOT ABLE TO DO  Patient/Family verbalized understanding of follow-up arrangements:  Yes  Individual responsible for coordination of the follow-up plan: Monique Ballard [phone]  Confirmed correct DME delivered: Monique Ballard 07/26/2021    Monique Ballard

## 2021-07-26 NOTE — Progress Notes (Signed)
PROGRESS NOTE   Subjective/Complaints:  Pt reports no issues Ate 95% of tray.   Looking forward to d/c tomorrow.   ROS: Limited by cognition Objective:   No results found.  No results for input(s): "WBC", "HGB", "HCT", "PLT" in the last 72 hours.  No results for input(s): "NA", "K", "CL", "CO2", "GLUCOSE", "BUN", "CREATININE", "CALCIUM" in the last 72 hours.   Intake/Output Summary (Last 24 hours) at 07/26/2021 0813 Last data filed at 07/25/2021 1700 Gross per 24 hour  Intake 240 ml  Output --  Net 240 ml        Physical Exam: Vital Signs Blood pressure 139/73, pulse 72, temperature 98 F (36.7 C), resp. rate 16, height '5\' 3"'$  (1.6 m), weight 56.5 kg, last menstrual period 01/22/1992, SpO2 96 %.      General: awake, alert, appropriate, sitting up in bed; watching TV; ate 95% of tray or more;  NAD HENT: conjugate gaze; oropharynx moist CV: regular rate; no JVD Pulmonary: CTA B/L; no W/R/R- good air movement GI: soft, NT, ND, (+)BS Psychiatric: appropriate- slightly flat affect Neurological: Alert- poor memory Musculoskeletal:     Comments: RLE with CAM boot/ splint bruising at right toes - entire foot bruised on R- evolving bruises Skin:    General: Skin is warm and dry.  Neurological:     Mental Status: She is alert and oriented to person, place, and time.     Sensory: No sensory deficit.     Motor: Weakness present.     Gait: Gait abnormal.     Comments: 5/5 in BUE 4/5 B HF and KE, L DF PF, RLE in splint   Sensation intact to LT   Assessment/Plan: 1. Functional deficits which require 3+ hours per day of interdisciplinary therapy in a comprehensive inpatient rehab setting. Physiatrist is providing close team supervision and 24 hour management of active medical problems listed below. Physiatrist and rehab team continue to assess barriers to discharge/monitor patient progress toward functional and  medical goals  Care Tool:  Bathing    Body parts bathed by patient: Right arm, Left arm, Chest, Abdomen, Front perineal area, Buttocks, Right upper leg, Left upper leg, Left lower leg, Face, Right lower leg   Body parts bathed by helper: Right lower leg Body parts n/a: Right lower leg   Bathing assist Assist Level: Contact Guard/Touching assist     Upper Body Dressing/Undressing Upper body dressing   What is the patient wearing?: Pull over shirt, Bra    Upper body assist Assist Level: Independent    Lower Body Dressing/Undressing Lower body dressing      What is the patient wearing?: Pants, Incontinence brief     Lower body assist Assist for lower body dressing: Minimal Assistance - Patient > 75%     Toileting Toileting    Toileting assist Assist for toileting: Minimal Assistance - Patient > 75%     Transfers Chair/bed transfer  Transfers assist     Chair/bed transfer assist level: Minimal Assistance - Patient > 75%     Locomotion Ambulation   Ambulation assist      Assist level: Moderate Assistance - Patient 50 - 74% Assistive device: Parallel  bars Max distance: 4   Walk 10 feet activity   Assist  Walk 10 feet activity did not occur: Safety/medical concerns (per PT eval note, ambulation only in parallel bars 4 feet.)        Walk 50 feet activity   Assist Walk 50 feet with 2 turns activity did not occur: Safety/medical concerns         Walk 150 feet activity   Assist Walk 150 feet activity did not occur: Safety/medical concerns         Walk 10 feet on uneven surface  activity   Assist Walk 10 feet on uneven surfaces activity did not occur: Safety/medical concerns         Wheelchair     Assist Is the patient using a wheelchair?: Yes Type of Wheelchair: Manual    Wheelchair assist level: Minimal Assistance - Patient > 75% Max wheelchair distance: 120'    Wheelchair 50 feet with 2 turns activity    Assist         Assist Level: Minimal Assistance - Patient > 75%   Wheelchair 150 feet activity     Assist      Assist Level: Maximal Assistance - Patient 25 - 49%   Blood pressure 139/73, pulse 72, temperature 98 F (36.7 C), resp. rate 16, height '5\' 3"'$  (1.6 m), weight 56.5 kg, last menstrual period 01/22/1992, SpO2 96 %.  Medical Problem List and Plan: 1. Functional deficits secondary to debility after PFO closure with hx recent R Fibular fx NWB x 6wk             -patient may  shower             -ELOS/Goals: 7-10d  Sup   Con't CIR- PT and OT- NWB on RLE x6 weeks  D/c 07/27/21  Con't CIR- d/c tomorrow 2.  Antithrombotics: -DVT/anticoagulation:  Pharmaceutical: Lovenox             -antiplatelet therapy: ASA and Clopidigrel   3. Pain Management: tylenol;tramadol  7/6- only has tylenol prn- now- using rarely.  4. Mood/Sleep: trazodone             -antipsychotic agents: NA 5. Neuropsych/cognition: This patient is  capable of making decisions on her own behalf. 6. Skin/Wound Care: [facility] for MASD  6/26- severe per nursing- wasn't able to assess- if doesn't improve, will give a few days of Diflucan  6/29- doing a little better- con't regimen  7/5- doing MUCH better- almost healed- con't regimen 7. Fluids/Electrolytes/Nutrition: BMET in am , monitor K+      Latest Ref Rng & Units 07/18/2021    5:41 AM 07/17/2021    6:30 AM 07/15/2021    3:08 PM  BMP  Glucose 70 - 99 mg/dL 100  99    BUN 8 - 23 mg/dL 6  7    Creatinine 0.44 - 1.00 mg/dL 0.61  0.68  0.66   Sodium 135 - 145 mmol/L 138  137    Potassium 3.5 - 5.1 mmol/L 3.9  4.6    Chloride 98 - 111 mmol/L 109  108    CO2 22 - 32 mmol/L 23  25    Calcium 8.9 - 10.3 mg/dL 8.7  8.9      8.  PFO closed per cardiology Dr Einar Gip 9.  R fibular fx in [place], NWB x  6 wks , f/u [facility] after DC  7/4- usually sticks to NWB precautions 10.  Sinus tach on BB  11.  Polycythemia CBC in am  12.  UTI >100K Pseudomonas A, Cefepime IV per  pharm  6/26- Dr Dwyane Dee suggested switching to Cipro 500 mg BID x 5 days- have done so and stopped Cefepime- will recheck labs in AM  6/27- WBC bounced up very slightly to 11.2 from 10.7- will recheck again in Am to make sure heading in right direction  6/28- since WBC 10.8- afebrile- will con't Cipro and monitor clinically for Sx's- did need in/out cath x1 last night- having some frequency still. Will do 7 days since still strongly (+) U/A  6/29- no Sx's of UTI anymore and no cath required- 7/6- resolved  13. Hx ETOH use - none currently- cessation education  14. Hypotension  6/27- BP 80/52 this AM- even with recheck- sitting up- will order TEDs- advised pt on drinking enough fluids- at least 6-8 cups water/day- she admits hasn't been drinking enough- held B Blocker this AM  6/28 - Lopressor held again last night- BP [id number] systolic- depending on BP prior to lorpessor dose, might hold again- d/w nursing.   6/29- BP 98/63 this AM- but up to 146/79 this Am as well.   6/30- BP 100/65- no Sx's of hypotension- con't regimen  7/1- BP a little elevated, but lopressor was held- will reduce Lopressor to 12.5 mg BID so might need to be held so much.   7/2- lopressor still held due to BP in 80s-90s last night- would stop, however when it's held, HR goes >100 frequently.  Vitals:   07/25/21 2014 07/26/21 0449  BP: 138/88 139/73  Pulse: 91 72  Resp: 20 16  Temp: 98.2 F (36.8 C) 98 F (36.7 C)  SpO2: 96% 96%   Some BP lability but HR <100BPM   7/4- BP overall much better and HR 75- con't to monitor and con't low dose metoprolol.   7/5- BP a little elevated, but pulse still in 70s- con't reigmen  7/6- BP borderline high, but controlled overall- con't regimen 15. Poor memory  7/2- Will check thiamine level on Monday 16. Dispo  7/2- will allow grounds pass with family.       LOS: 11 days A FACE TO FACE EVALUATION WAS PERFORMED  Vipul Cafarelli 07/26/2021, 8:13 AM

## 2021-07-26 NOTE — Plan of Care (Signed)
  Problem: RH Balance Goal: LTG: Patient will maintain dynamic sitting balance (OT) Description: LTG:  Patient will maintain dynamic sitting balance with assistance during activities of daily living (OT) Outcome: Completed/Met Goal: LTG Patient will maintain dynamic standing with ADLs (OT) Description: LTG:  Patient will maintain dynamic standing balance with assist during activities of daily living (OT)  Outcome: Completed/Met   Problem: Sit to Stand Goal: LTG:  Patient will perform sit to stand in prep for activites of daily living with assistance level (OT) Description: LTG:  Patient will perform sit to stand in prep for activites of daily living with assistance level (OT) Outcome: Completed/Met   Problem: RH Grooming Goal: LTG Patient will perform grooming w/assist,cues/equip (OT) Description: LTG: Patient will perform grooming with assist, with/without cues using equipment (OT) Outcome: Completed/Met   Problem: RH Bathing Goal: LTG Patient will bathe all body parts with assist levels (OT) Description: LTG: Patient will bathe all body parts with assist levels (OT) Outcome: Completed/Met   Problem: RH Dressing Goal: LTG Patient will perform upper body dressing (OT) Description: LTG Patient will perform upper body dressing with assist, with/without cues (OT). Outcome: Completed/Met Goal: LTG Patient will perform lower body dressing w/assist (OT) Description: LTG: Patient will perform lower body dressing with assist, with/without cues in positioning using equipment (OT) Outcome: Completed/Met   Problem: RH Toileting Goal: LTG Patient will perform toileting task (3/3 steps) with assistance level (OT) Description: LTG: Patient will perform toileting task (3/3 steps) with assistance level (OT)  Outcome: Completed/Met   Problem: RH Simple Meal Prep Goal: LTG Patient will perform simple meal prep w/assist (OT) Description: LTG: Patient will perform simple meal prep with assistance,  with/without cues (OT). Outcome: Completed/Met   Problem: RH Light Housekeeping Goal: LTG Patient will perform light housekeeping w/assist (OT) Description: LTG: Patient will perform light housekeeping with assistance, with/without cues (OT). Outcome: Completed/Met   Problem: RH Toilet Transfers Goal: LTG Patient will perform toilet transfers w/assist (OT) Description: LTG: Patient will perform toilet transfers with assist, with/without cues using equipment (OT) Outcome: Completed/Met   Problem: RH Tub/Shower Transfers Goal: LTG Patient will perform tub/shower transfers w/assist (OT) Description: LTG: Patient will perform tub/shower transfers with assist, with/without cues using equipment (OT) Outcome: Completed/Met   Problem: RH Pre-functional/Other (Specify) Goal: RH LTG OT (Specify) 1 Description: RH LTG OT (Specify) 1 Outcome: Completed/Met

## 2021-07-27 LAB — VITAMIN B1: Vitamin B1 (Thiamine): 114.6 nmol/L (ref 66.5–200.0)

## 2021-07-27 MED ORDER — CLOPIDOGREL BISULFATE 75 MG PO TABS: 75.0000 mg | ORAL_TABLET | Freq: Every day | ORAL | 0 refills | Status: DC

## 2021-07-27 MED ORDER — ESCITALOPRAM OXALATE 20 MG PO TABS: 20.0000 mg | ORAL_TABLET | Freq: Every day | ORAL | 0 refills | Status: DC

## 2021-07-27 MED ORDER — PANTOPRAZOLE SODIUM 40 MG PO TBEC: 40.0000 mg | DELAYED_RELEASE_TABLET | Freq: Every day | ORAL | 0 refills | Status: DC

## 2021-07-27 MED ORDER — FOLIC ACID 1 MG PO TABS: 1.0000 mg | ORAL_TABLET | Freq: Every day | ORAL | 0 refills | Status: DC

## 2021-07-27 MED ORDER — ADULT MULTIVITAMIN W/MINERALS CH: 1.0000 | ORAL_TABLET | Freq: Every day | ORAL | Status: AC

## 2021-07-27 MED ORDER — METOPROLOL TARTRATE 25 MG PO TABS: 12.5000 mg | ORAL_TABLET | Freq: Two times a day (BID) | ORAL | 0 refills | Status: DC

## 2021-07-27 MED ORDER — CLOPIDOGREL BISULFATE 75 MG PO TABS: 75.0000 mg | ORAL_TABLET | Freq: Every day | ORAL | 0 refills | Status: AC

## 2021-07-27 MED ORDER — GERHARDT'S BUTT CREAM: 1.0000 | TOPICAL_CREAM | Freq: Three times a day (TID) | CUTANEOUS | Status: DC

## 2021-07-27 MED ORDER — THIAMINE HCL 100 MG PO TABS: 100.0000 mg | ORAL_TABLET | Freq: Every day | ORAL | 0 refills | Status: AC

## 2021-07-27 MED ORDER — METOPROLOL TARTRATE 25 MG PO TABS: 25.0000 mg | ORAL_TABLET | Freq: Two times a day (BID) | ORAL | 0 refills | Status: DC

## 2021-07-27 NOTE — Progress Notes (Signed)
Patient ID: Monique Ballard, female   DOB: October 18, 1940, 81 y.o.   MRN: [id number]  Met with patient and daughter in patient room. Both stated they are very pleased with the rehab stay. They feel prepare for discharge and had no further questions at this time. I explained the [employer] and the contents included. I explained that it is theirs to take home and refer to when needed. The daughter stated that home health PT will begin on Sunday. They are currently waiting on a WC exchange and then they are ready for discharge.  Dorthula Nettles, RN3, BSN, CBIS, [place], [facility], Inpatient Rehabilitation Office [phone]

## 2021-07-27 NOTE — Progress Notes (Signed)
Patient discharged to home with all personal belongings in hand. Discharge paperwork reviewed by PA. Patient and family deny further questions at this time.

## 2021-07-27 NOTE — Progress Notes (Signed)
PROGRESS NOTE   Subjective/Complaints:  D/c today- pt ready for d/c.  Explained that will not need f/u with me in clinic.    ROS: Limited by cognition Objective:   No results found.  No results for input(s): "WBC", "HGB", "HCT", "PLT" in the last 72 hours.  No results for input(s): "NA", "K", "CL", "CO2", "GLUCOSE", "BUN", "CREATININE", "CALCIUM" in the last 72 hours.   Intake/Output Summary (Last 24 hours) at 07/27/2021 0747 Last data filed at 07/26/2021 1932 Gross per 24 hour  Intake 580 ml  Output --  Net 580 ml        Physical Exam: Vital Signs Blood pressure (!) 140/98, pulse 77, temperature 98.6 F (37 C), temperature source Oral, resp. rate 16, height '5\' 3"'$  (1.6 m), weight 56.5 kg, last menstrual period 01/22/1992, SpO2 96 %.      General: awake, alert, appropriate, sitting up ate 100% tray;  NAD HENT: conjugate gaze; oropharynx moist CV: regular rate; no JVD Pulmonary: CTA B/L; no W/R/R- good air movement GI: soft, NT, ND, (+)BS Psychiatric: appropriate Neurological: alert- poor memory Musculoskeletal:     Comments: RLE with CAM boot/ splint bruising at right toes - entire foot bruised on R- evolving bruises Skin:    General: Skin is warm and dry.  Neurological:     Mental Status: She is alert and oriented to person, place, and time.     Sensory: No sensory deficit.     Motor: Weakness present.     Gait: Gait abnormal.     Comments: 5/5 in BUE 4/5 B HF and KE, L DF PF, RLE in splint   Sensation intact to LT   Assessment/Plan: 1. Functional deficits which require 3+ hours per day of interdisciplinary therapy in a comprehensive inpatient rehab setting. Physiatrist is providing close team supervision and 24 hour management of active medical problems listed below. Physiatrist and rehab team continue to assess barriers to discharge/monitor patient progress toward functional and medical goals  Care  Tool:  Bathing    Body parts bathed by patient: Right arm, Left arm, Chest, Abdomen, Front perineal area, Buttocks, Right upper leg, Left upper leg, Left lower leg, Face, Right lower leg   Body parts bathed by helper: Right lower leg Body parts n/a: Right lower leg   Bathing assist Assist Level: Contact Guard/Touching assist     Upper Body Dressing/Undressing Upper body dressing   What is the patient wearing?: Pull over shirt, Bra    Upper body assist Assist Level: Independent    Lower Body Dressing/Undressing Lower body dressing      What is the patient wearing?: Pants, Incontinence brief     Lower body assist Assist for lower body dressing: Minimal Assistance - Patient > 75%     Toileting Toileting    Toileting assist Assist for toileting: Minimal Assistance - Patient > 75%     Transfers Chair/bed transfer  Transfers assist     Chair/bed transfer assist level: Contact Guard/Touching assist     Locomotion Ambulation   Ambulation assist   Ambulation activity did not occur: Safety/medical concerns (pt unable to maintain wt bearing precautions)  Assist level: Moderate Assistance - Patient  50 - 74% Assistive device: Parallel bars Max distance: 4   Walk 10 feet activity   Assist  Walk 10 feet activity did not occur: Safety/medical concerns        Walk 50 feet activity   Assist Walk 50 feet with 2 turns activity did not occur: Safety/medical concerns         Walk 150 feet activity   Assist Walk 150 feet activity did not occur: Safety/medical concerns         Walk 10 feet on uneven surface  activity   Assist Walk 10 feet on uneven surfaces activity did not occur: Safety/medical concerns         Wheelchair     Assist Is the patient using a wheelchair?: Yes Type of Wheelchair: Manual    Wheelchair assist level: Independent Max wheelchair distance: >256f    Wheelchair 50 feet with 2 turns activity    Assist         Assist Level: Independent   Wheelchair 150 feet activity     Assist      Assist Level: Independent   Blood pressure (!) 140/98, pulse 77, temperature 98.6 F (37 C), temperature source Oral, resp. rate 16, height '5\' 3"'$  (1.6 m), weight 56.5 kg, last menstrual period 01/22/1992, SpO2 96 %.  Medical Problem List and Plan: 1. Functional deficits secondary to debility after PFO closure with hx recent R Fibular fx NWB x 6wk             -patient may  shower             -ELOS/Goals: 7-10d  Sup  D/c today- won't need f/u in my clinic with me- only with PCP and Cards 2.  Antithrombotics: -DVT/anticoagulation:  Pharmaceutical: Lovenox             -antiplatelet therapy: ASA and Clopidigrel   3. Pain Management: tylenol;tramadol  7/6- only has tylenol prn- now- using rarely.  4. Mood/Sleep: trazodone             -antipsychotic agents: NA 5. Neuropsych/cognition: This patient is  capable of making decisions on her own behalf. 6. Skin/Wound Care: [facility] for MASD  6/26- severe per nursing- wasn't able to assess- if doesn't improve, will give a few days of Diflucan  6/29- doing a little better- con't regimen  7/5- doing MUCH better- almost healed- con't regimen 7. Fluids/Electrolytes/Nutrition: BMET in am , monitor K+      Latest Ref Rng & Units 07/18/2021    5:41 AM 07/17/2021    6:30 AM 07/15/2021    3:08 PM  BMP  Glucose 70 - 99 mg/dL 100  99    BUN 8 - 23 mg/dL 6  7    Creatinine 0.44 - 1.00 mg/dL 0.61  0.68  0.66   Sodium 135 - 145 mmol/L 138  137    Potassium 3.5 - 5.1 mmol/L 3.9  4.6    Chloride 98 - 111 mmol/L 109  108    CO2 22 - 32 mmol/L 23  25    Calcium 8.9 - 10.3 mg/dL 8.7  8.9      8.  PFO closed per cardiology Dr GEinar Gip9.  [place] fibular fx in [place] x  6 wks , f/u [facility] after DC  7/4- usually sticks to NWB precautions 10.  Sinus tach on BB 11.  Polycythemia CBC in am  12.  UTI >100K Pseudomonas A, Cefepime IV per pharm  6/26- Dr KDwyane Dee  suggested  switching to Cipro 500 mg BID x 5 days- have done so and stopped Cefepime- will recheck labs in AM  6/27- WBC bounced up very slightly to 11.2 from 10.7- will recheck again in Am to make sure heading in right direction  6/28- since WBC 10.8- afebrile- will con't Cipro and monitor clinically for Sx's- did need in/out cath x1 last night- having some frequency still. Will do 7 days since still strongly (+) U/A  6/29- no Sx's of UTI anymore and no cath required- 7/6- resolved  13. Hx ETOH use - none currently- cessation education  14. Hypotension  6/27- BP 80/52 this AM- even with recheck- sitting up- will order TEDs- advised pt on drinking enough fluids- at least 6-8 cups water/day- she admits hasn't been drinking enough- held B Blocker this AM  6/28 - Lopressor held again last night- BP [id number] systolic- depending on BP prior to lorpessor dose, might hold again- d/w nursing.   6/29- BP 98/63 this AM- but up to 146/79 this Am as well.   6/30- BP 100/65- no Sx's of hypotension- con't regimen  7/1- BP a little elevated, but lopressor was held- will reduce Lopressor to 12.5 mg BID so might need to be held so much.   7/2- lopressor still held due to BP in 80s-90s last night- would stop, however when it's held, HR goes >100 frequently.  Vitals:   07/26/21 1953 07/27/21 0452  BP: 140/78 (!) 140/98  Pulse:  77  Resp:  16  Temp:  98.6 F (37 C)  SpO2:  96%   Some BP lability but HR <100BPM   7/4- BP overall much better and HR 75- con't to monitor and con't low dose metoprolol.   7/5- BP a little elevated, but pulse still in 70s- con't reigmen  7/6- BP borderline high, but controlled overall- con't regimen 15. Poor memory  7/2- Will check thiamine level on Monday 16. Dispo  7/2- will allow grounds pass with family.   7/7- d/c today     LOS: 12 days A FACE TO FACE EVALUATION WAS PERFORMED  Meagan Ancona 07/27/2021, 7:47 AM

## 2021-08-02 ENCOUNTER — Ambulatory Visit: Payer: Medicare Other | Admitting: Cardiology

## 2021-08-02 ENCOUNTER — Encounter: Payer: Self-pay | Admitting: Cardiology

## 2021-08-02 VITALS — BP 119/81 | HR 77 | Temp 98.4°F | Resp 16 | Ht 63.0 in | Wt 127.0 lb

## 2021-08-02 DIAGNOSIS — I77819 Aortic ectasia, unspecified site: Secondary | ICD-10-CM

## 2021-08-02 DIAGNOSIS — I10 Essential (primary) hypertension: Secondary | ICD-10-CM

## 2021-08-02 DIAGNOSIS — R0609 Other forms of dyspnea: Secondary | ICD-10-CM | POA: Diagnosis not present

## 2021-08-02 DIAGNOSIS — Z8774 Personal history of (corrected) congenital malformations of heart and circulatory system: Secondary | ICD-10-CM

## 2021-08-02 NOTE — Progress Notes (Signed)
ID:  Monique Ballard, DOB Jun 16, 1940, MRN [id number]  PCP:  Colon Branch, MD  Cardiologist:  Monique Kras, DO, [facility] (established care 07/10/2021)  Date: 08/02/21   Chief Complaint  Patient presents with   Shortness of Breath   New Patient (Initial Visit)   Hospitalization Follow-up    HPI  Monique Ballard is a 81 y.o. Caucasian female whose past medical history and cardiovascular risk factors include: Platypnea orthodeoxia syndrome, status post PFO closure, hypertension, left-sided breast cancer status post lumpectomy/radiation/tamoxifen, anxiety, depression, GERD, esophageal strictures, aortic atherosclerosis, ectasia of the ascending thoracic aorta 39 mm (CT scan 07/08/2021).  Establish care with the patient during her hospitalization in June 2023 when a consult was requested for shortness of breath and chronic hypoxia.  She had an echocardiogram which noted concerns for intra cardiac shunt.  Transesophageal echocardiogram was contraindicated as work-up noted esophageal strictures and she was scheduled for right heart catheterization with possible PFO closure.  Under intracardiac echo patient was noted to have a positive PFO and underwent PFO closure with 25 mm Amplatzer Talisman septal occluder on 07/12/2021.   Postprocedure she did well is no longer on oxygen supplementation.  She now presents for follow-up visit.  She denies anginal discomfort or heart failure symptoms.  And is very thankful as she no longer requires daily oxygen use.  FUNCTIONAL STATUS: Limited as she is currently in a wheelchair  ALLERGIES: Allergies  Allergen Reactions   Penicillins Rash    Has patient had a PCN reaction causing immediate rash, facial/tongue/throat swelling, SOB or lightheadedness with hypotension: Yes Has patient had a PCN reaction causing severe rash involving mucus membranes or skin necrosis: No Has patient had a PCN reaction that required hospitalization No Has patient had a PCN reaction  occurring within the last 10 years: No If all of the above answers are "NO", then may proceed with Cephalosporin use.     MEDICATION LIST PRIOR TO VISIT: Current Meds  Medication Sig   aspirin EC 81 MG tablet Take 1 tablet (81 mg total) by mouth daily. Swallow whole.   benzonatate (TESSALON) 100 MG capsule Take 100 mg by mouth 3 (three) times daily as needed for cough.   clopidogrel (PLAVIX) 75 MG tablet Take 1 tablet (75 mg total) by mouth daily with breakfast for 18 days.   escitalopram (LEXAPRO) 20 MG tablet Take 1 tablet (20 mg total) by mouth daily.   folic acid (FOLVITE) 1 MG tablet Take 1 tablet (1 mg total) by mouth daily.   metoprolol tartrate (LOPRESSOR) 25 MG tablet Take 0.5 tablets (12.5 mg total) by mouth 2 (two) times daily.   Multiple Vitamin (MULTIVITAMIN WITH MINERALS) TABS tablet Take 1 tablet by mouth daily.   Nystatin (GERHARDT'S BUTT CREAM) CREA Apply 1 Application topically 3 (three) times daily.   OLANZapine (ZYPREXA) 7.5 MG tablet TAKE ONE TABLET BY MOUTH DAILY AT BEDTIME (Patient taking differently: Take 7.5 mg by mouth at bedtime.)   pantoprazole (PROTONIX) 40 MG tablet Take 1 tablet (40 mg total) by mouth daily.   thiamine 100 MG tablet Take 1 tablet (100 mg total) by mouth daily.     PAST MEDICAL HISTORY: Past Medical History:  Diagnosis Date   Anxiety    Breast cancer ([place]) fall 1997    Left Lumpectomy, XRT.  treated with Tamoxifem and Evista   Depression    Endometriosis in her 20's   surgery to help clear endometriosis to obtain a pregnancy.   GERD (gastroesophageal  reflux disease)    w/"stretching" remotely   HTN (hypertension)    Migraines     PAST SURGICAL HISTORY: Past Surgical History:  Procedure Laterality Date   BREAST LUMPECTOMY Left fall 1997   Lymph nodes X 3 were negative, ER+, Radiation treatmetn.  Took Tamoxifem X 5 yrs then Evista for 3-5 yrs.   CESAREAN SECTION     x2  first pregnancy breach and second normal   PATENT FORAMEN  OVALE(PFO) CLOSURE N/A 07/12/2021   Procedure: PATENT FORAMEN OVALE(PFO) CLOSURE;  Surgeon: Monique Prows, MD;  Location: [place] [facility];  Service: Cardiovascular;  Laterality: N/A;   RIGHT HEART CATH N/A 07/12/2021   Procedure: RIGHT HEART CATH;  Surgeon: Monique Prows, MD;  Location: [place] [facility];  Service: Cardiovascular;  Laterality: N/A;   surgery for endometriosis      FAMILY HISTORY: The patient family history includes Heart attack in her father; Heart disease in her father; Hypertension in her mother; Stroke in her mother; Throat cancer in an other family member.  SOCIAL HISTORY:  The patient  reports that she quit smoking about 33 years ago. Her smoking use included cigarettes. She has a 12.50 pack-year smoking history. She has never used smokeless tobacco. She reports that she does not currently use alcohol. She reports that she does not use drugs.  REVIEW OF SYSTEMS: Review of Systems  Cardiovascular:  Negative for chest pain, claudication, dyspnea on exertion, irregular heartbeat, leg swelling, near-syncope, orthopnea, palpitations, paroxysmal nocturnal dyspnea and syncope.  Respiratory:  Negative for shortness of breath.   Hematologic/Lymphatic: Negative for bleeding problem.  Musculoskeletal:  Negative for muscle cramps and myalgias.  Neurological:  Negative for dizziness and light-headedness.   PHYSICAL EXAM:    08/02/2021    3:15 PM 07/27/2021    4:52 AM 07/26/2021    7:53 PM  Vitals with BMI  Height '5\' 3"'$     Weight 127 lbs    BMI 17.4    Systolic [phone]  Diastolic 81 98 78  Pulse 77 77     CONSTITUTIONAL: Appears older than stated age, hemodynamically stable, no acute distress, presently in a wheelchair SKIN: Skin is warm and dry. No rash noted. No cyanosis. No pallor. No jaundice HEAD: Normocephalic and atraumatic.  EYES: No scleral icterus MOUTH/THROAT: Moist oral membranes.  NECK: No JVD present. No thyromegaly noted. No carotid bruits  CHEST Normal  respiratory effort. No intercostal retractions  LUNGS: Clear to auscultation bilaterally.  No stridor. No wheezes. No rales.  CARDIOVASCULAR: Regular rate and rhythm, positive S1-S2, no murmurs rubs or gallops appreciated. ABDOMINAL: Soft, nontender, nondistended, positive bowel sounds in all 4 quadrants, no apparent ascites.  EXTREMITIES: No pitting edema in the left lower extremity.  Right lower extremity wrapped in a boot HEMATOLOGIC: No significant bruising NEUROLOGIC: Oriented to person, place, and time. Nonfocal. Normal muscle tone.  PSYCHIATRIC: Normal mood and affect. Normal behavior. Cooperative  CARDIAC DATABASE: EKG: 08/02/2021: Normal sinus rhythm, 73 bpm, poor R wave progression, without underlying ischemia or injury pattern.  Echocardiogram: 07/09/2021: LVEF 55-60%, no regional wall motion abnormalities, grade 1 diastolic impairment, right ventricular size and function normal, no mitral stenosis, aortic valve sclerosis without stenosis, estimated RAP 3 mmHg, positive sonicated saline study suggestive of intra-atrial shunt.  Stress Testing: No results found for this or any previous visit from the past 1095 days.   Heart Catheterization: None  Cardiac MR 07/11/2021: 1.  Normal biventricular chamber size and function. 2.  Qp/Qs is  0.93. 3. No ASD seen. No definite anomalous pulmonary venous return. No findings to suggest congenital heart lesions or intracardiac shunt  Procedure right heart cath and ASD repair 07/12/2021: Right heart catheterization and closure of the patent foramen ovale with implantation of a Amplatzer Talisman 25 mm ASO device. Strongly positive right to left shunting through PFO reduced to minimal shunting post closure and expect complete closure of the PFO once endothelialization occurs.   Normal right heart catheterization with preserved cardiac output and cardiac index.  Qp/Qs 1.0.  Recommendation: Aspirin indefinitely, Plavix for 30 days along with  aspirin, endocarditis prophylaxis for 6 months.  I expect significant improvement in her dyspnea.  LABORATORY DATA:    Latest Ref Rng & Units 07/18/2021    5:41 AM 07/17/2021    6:30 AM 07/15/2021    3:08 PM  CBC  WBC 4.0 - 10.5 K/uL 10.8  11.2  10.7   Hemoglobin 12.0 - 15.0 g/dL 11.5  11.8  12.2   Hematocrit 36.0 - 46.0 % 33.2  36.0  37.5   Platelets 150 - 400 K/uL 285  310  299        Latest Ref Rng & Units 07/18/2021    5:41 AM 07/17/2021    6:30 AM 07/15/2021    3:08 PM  CMP  Glucose 70 - 99 mg/dL 100  99    BUN 8 - 23 mg/dL 6  7    Creatinine 0.44 - 1.00 mg/dL 0.61  0.68  0.66   Sodium 135 - 145 mmol/L 138  137    Potassium 3.5 - 5.1 mmol/L 3.9  4.6    Chloride 98 - 111 mmol/L 109  108    CO2 22 - 32 mmol/L 23  25    Calcium 8.9 - 10.3 mg/dL 8.7  8.9    Total Protein 6.5 - 8.1 g/dL  5.7    Total Bilirubin 0.3 - 1.2 mg/dL  0.7    Alkaline Phos 38 - 126 U/L  52    AST 15 - 41 U/L  23    ALT 0 - 44 U/L  21      Lipid Panel     Component Value Date/Time   CHOL 161 01/01/2018 1044   TRIG 197.0 (H) 01/01/2018 1044   HDL 47.70 01/01/2018 1044   CHOLHDL 3 01/01/2018 1044   VLDL 39.4 01/01/2018 1044   LDLCALC 74 01/01/2018 1044    No components found for: "NTPROBNP" No results for input(s): "PROBNP" in the last 8760 hours. Recent Labs    07/08/21 1803  TSH 1.324    BMP Recent Labs    07/15/21 0133 07/15/21 1508 07/17/21 0630 07/18/21 0541  NA 137  --  137 138  K 3.4*  --  4.6 3.9  CL 110  --  108 109  CO2 21*  --  25 23  GLUCOSE 111*  --  99 100*  BUN <5*  --  7* 6*  CREATININE 0.64 0.66 0.68 0.61  CALCIUM 8.1*  --  8.9 8.7*  GFRNONAA >60 >60 >60 >60    HEMOGLOBIN A1C Lab Results  Component Value Date   HGBA1C 4.1 03/03/2020   MPG 71 03/03/2020    IMPRESSION:    ICD-10-CM   1. Platypnea-orthodeoxia syndrome  R06.09 EKG 12-Lead    PCV ECHOCARDIOGRAM COMPLETE W BUBBLE    2. PFO closure with 25 mm Amplatzer Talisman septal occluder on 07/12/2021   Z87.74 PCV ECHOCARDIOGRAM COMPLETE W BUBBLE  3. Essential hypertension  I10     4. Ectatic aorta [facility])  I77.819        RECOMMENDATIONS: Ninamarie Keel is a 81 y.o. Caucasian female whose past medical history and cardiac risk factors include: Platypnea orthodeoxia syndrome, status post PFO closure, hypertension, left-sided breast cancer status post lumpectomy/radiation/tamoxifen, anxiety, depression, GERD, esophageal strictures, aortic atherosclerosis, ectasia of the ascending thoracic aorta 39 mm (CT scan 07/08/2021).  She had a history of chronic hypoxia with oxygen requirement when sitting upright or ambulating.  Her oxygen saturation would improve when laying flat and the symptoms were concerning for platypnea orthodeoxia syndrome.  Echocardiogram in the hospital noted preserved LVEF with concerns for intracardiac shunt.  We discussed undergoing transesophageal echocardiogram but given her history of dysphagia she underwent esophagram which confirmed the presence of strictures.  GI recommended outpatient follow-up and patient proceeded with right heart catheterization with intracardiac echo during procedure to evaluate for PFO.  Patient was noted to have a PFO which was closed with a 25 mm Amplatzer device and postoperatively she did well.  She immediately came off of oxygen and remains off of oxygen here respectively of laying supine, upright or walking.  Patient is thankful for the care provided in the resolution of hypoxia.  Patient is not ambulating secondary to her orthopedic injury in the right lower extremity.  Patient is aware of being on aspirin indefinitely, Plavix for the first 30 days, and endocarditis prophylaxis for the first 6 months for procedures.  Both patient and daughter verbalized understanding.  From a cardiovascular standpoint patient is stable and no additional work-up is warranted.  Further decision was to hold off on stress testing if she does not have anginal  discomfort, no shortness of breath with effort related activities.  As her orthopedic injury improves patient is encouraged to increase her physical activity and if she has effort related symptoms to come back to the office sooner than her scheduled appointment.  Otherwise we will see her back on an annual basis echo with bubble study prior to her annual visit.  FINAL MEDICATION LIST END OF ENCOUNTER: No orders of the defined types were placed in this encounter.   There are no discontinued medications.   Current Outpatient Medications:    aspirin EC 81 MG tablet, Take 1 tablet (81 mg total) by mouth daily. Swallow whole., Disp: 30 tablet, Rfl: 11   benzonatate (TESSALON) 100 MG capsule, Take 100 mg by mouth 3 (three) times daily as needed for cough., Disp: , Rfl:    clopidogrel (PLAVIX) 75 MG tablet, Take 1 tablet (75 mg total) by mouth daily with breakfast for 18 days., Disp: 18 tablet, Rfl: 0   escitalopram (LEXAPRO) 20 MG tablet, Take 1 tablet (20 mg total) by mouth daily., Disp: 30 tablet, Rfl: 0   folic acid (FOLVITE) 1 MG tablet, Take 1 tablet (1 mg total) by mouth daily., Disp: 30 tablet, Rfl: 0   metoprolol tartrate (LOPRESSOR) 25 MG tablet, Take 0.5 tablets (12.5 mg total) by mouth 2 (two) times daily., Disp: 30 tablet, Rfl: 0   Multiple Vitamin (MULTIVITAMIN WITH MINERALS) TABS tablet, Take 1 tablet by mouth daily., Disp: , Rfl:    Nystatin (GERHARDT'S BUTT CREAM) CREA, Apply 1 Application topically 3 (three) times daily., Disp: , Rfl:    OLANZapine (ZYPREXA) 7.5 MG tablet, TAKE ONE TABLET BY MOUTH DAILY AT BEDTIME (Patient taking differently: Take 7.5 mg by mouth at bedtime.), Disp: 90 tablet, Rfl: 0   pantoprazole (PROTONIX)  40 MG tablet, Take 1 tablet (40 mg total) by mouth daily., Disp: 30 tablet, Rfl: 0   thiamine 100 MG tablet, Take 1 tablet (100 mg total) by mouth daily., Disp: 30 tablet, Rfl: 0  Orders Placed This Encounter  Procedures   EKG 12-Lead   PCV ECHOCARDIOGRAM  COMPLETE W BUBBLE    There are no Patient Instructions on file for this visit.   --Continue cardiac medications as reconciled in final medication list. --Return in about 1 year (around 08/03/2022) for Annual follow up . or sooner if needed. --Continue follow-up with your primary care physician regarding the management of your other chronic comorbid conditions.  Patient's questions and concerns were addressed to her satisfaction. She voices understanding of the instructions provided during this encounter.   This note was created using a voice recognition software as a result there may be grammatical errors inadvertently enclosed that do not reflect the nature of this encounter. Every attempt is made to correct such errors.  Monique Ballard, [place], [facility]  Pager: [phone] Office: [phone]

## 2021-08-03 ENCOUNTER — Telehealth: Payer: Self-pay

## 2021-08-03 DIAGNOSIS — J449 Chronic obstructive pulmonary disease, unspecified: Secondary | ICD-10-CM | POA: Diagnosis not present

## 2021-08-03 DIAGNOSIS — F1020 Alcohol dependence, uncomplicated: Secondary | ICD-10-CM

## 2021-08-03 DIAGNOSIS — Z993 Dependence on wheelchair: Secondary | ICD-10-CM

## 2021-08-03 DIAGNOSIS — G43909 Migraine, unspecified, not intractable, without status migrainosus: Secondary | ICD-10-CM | POA: Diagnosis not present

## 2021-08-03 DIAGNOSIS — Z7982 Long term (current) use of aspirin: Secondary | ICD-10-CM

## 2021-08-03 DIAGNOSIS — F32A Depression, unspecified: Secondary | ICD-10-CM | POA: Diagnosis not present

## 2021-08-03 DIAGNOSIS — G8929 Other chronic pain: Secondary | ICD-10-CM | POA: Diagnosis not present

## 2021-08-03 DIAGNOSIS — Z853 Personal history of malignant neoplasm of breast: Secondary | ICD-10-CM

## 2021-08-03 DIAGNOSIS — I2510 Atherosclerotic heart disease of native coronary artery without angina pectoris: Secondary | ICD-10-CM | POA: Diagnosis not present

## 2021-08-03 DIAGNOSIS — E785 Hyperlipidemia, unspecified: Secondary | ICD-10-CM | POA: Diagnosis not present

## 2021-08-03 DIAGNOSIS — S8261XD Displaced fracture of lateral malleolus of right fibula, subsequent encounter for closed fracture with routine healing: Secondary | ICD-10-CM | POA: Diagnosis not present

## 2021-08-03 DIAGNOSIS — Z9181 History of falling: Secondary | ICD-10-CM

## 2021-08-03 DIAGNOSIS — F419 Anxiety disorder, unspecified: Secondary | ICD-10-CM | POA: Diagnosis not present

## 2021-08-03 DIAGNOSIS — R5381 Other malaise: Secondary | ICD-10-CM

## 2021-08-03 DIAGNOSIS — M84471A Pathological fracture, right ankle, initial encounter for fracture: Secondary | ICD-10-CM | POA: Diagnosis not present

## 2021-08-03 DIAGNOSIS — I10 Essential (primary) hypertension: Secondary | ICD-10-CM | POA: Diagnosis not present

## 2021-08-03 DIAGNOSIS — Z7902 Long term (current) use of antithrombotics/antiplatelets: Secondary | ICD-10-CM

## 2021-08-03 DIAGNOSIS — K219 Gastro-esophageal reflux disease without esophagitis: Secondary | ICD-10-CM | POA: Diagnosis not present

## 2021-08-03 DIAGNOSIS — Z87891 Personal history of nicotine dependence: Secondary | ICD-10-CM

## 2021-08-03 DIAGNOSIS — J9611 Chronic respiratory failure with hypoxia: Secondary | ICD-10-CM | POA: Diagnosis not present

## 2021-08-03 DIAGNOSIS — M545 Low back pain, unspecified: Secondary | ICD-10-CM

## 2021-08-03 NOTE — Telephone Encounter (Signed)
Plan of care signed and faxed back to [facility] at [phone]. Form sent for scanning.

## 2021-08-07 DIAGNOSIS — M25571 Pain in right ankle and joints of right foot: Secondary | ICD-10-CM | POA: Diagnosis not present

## 2021-08-07 DIAGNOSIS — S8261XA Displaced fracture of lateral malleolus of right fibula, initial encounter for closed fracture: Secondary | ICD-10-CM | POA: Diagnosis not present

## 2021-08-10 ENCOUNTER — Ambulatory Visit (INDEPENDENT_AMBULATORY_CARE_PROVIDER_SITE_OTHER): Payer: Medicare Other | Admitting: Internal Medicine

## 2021-08-10 ENCOUNTER — Encounter: Payer: Self-pay | Admitting: Internal Medicine

## 2021-08-10 VITALS — BP 116/74 | HR 94 | Temp 98.6°F | Resp 18 | Ht 63.0 in | Wt 120.5 lb

## 2021-08-10 DIAGNOSIS — F1021 Alcohol dependence, in remission: Secondary | ICD-10-CM | POA: Diagnosis not present

## 2021-08-10 DIAGNOSIS — R0902 Hypoxemia: Secondary | ICD-10-CM

## 2021-08-10 DIAGNOSIS — Z09 Encounter for follow-up examination after completed treatment for conditions other than malignant neoplasm: Secondary | ICD-10-CM | POA: Diagnosis not present

## 2021-08-10 NOTE — Patient Instructions (Addendum)
Recommend to proceed with the following vaccines at your pharmacy:  Shingrix (shingles) Flu shot this fall  Check the  blood pressure regularly BP GOAL is between 110/65 and  135/85. If it is consistently higher or lower, let me know     GO TO THE FRONT DESK, PLEASE SCHEDULE YOUR APPOINTMENTS Come back for   a check up in 3 months

## 2021-08-10 NOTE — Assessment & Plan Note (Signed)
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 Admitted with the following issues. -Acute on chronic hypoxic respiratory failure secondary to intra-atrial shunt, closed  07/13/2021, hypoxia resolved. -Right ankle fracture -EtOH abuse -UTI. Since she left rehab, he is back home, lives with her husband and one of her sons. 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 Currently getting PT ( OT pending) Also has a personal caregiver from 9am-5 pm daily. Appetite is very good, she is not drinking etoh at all. Good med compliance. BP ranges from 105/64 to 129/81, heart rate: 80-90.  O2 sat 94% - 97%. Not needing xyprexa for sleep Will finish her 30 days of Plavix tomorrow and will continue with aspirin. Will need antibiotics for endocarditis prophylaxis x6 months Overall I think she is doing great, we agree on no blood work today. Next visit 3 months

## 2021-08-10 NOTE — Progress Notes (Signed)
Subjective:    Patient ID: Monique Ballard, female    DOB: [birth year], 81 y.o.   MRN: [id number]  DOS:  08/10/2021 Type of visit - description: Hospital follow-up  Admitted to  acute care 07/15/2021: - Acute on chronic hypoxic respiratory failure: Required BiPAP upon arrival.  Had extensive evaluation by pulmonary, eventually Dx with intra-arterial shunt, it was closed 07/13/2021. She weaned off of oxygen. - Upon admission she was also Dx with right ankle fracture, nonoperative management. - Polycythemia felt to be due to hypoxia. - EtOH abuse, on CIWA protocol. - Had a UTI, treated with cefepime and then Cipro x7 days.  Subsequently was transferred to inpatient rehab on 07/15/2021, she was discharged 07/27/2021 in improved condition.. Had episodes of urinary retention requiring I/O cath. Slightly hypotensive, TED hose ordered, encourage good p.o. hydration. Thiamine levels were low. BPs were soft, metoprolol dose decreased. Also was recommend:  cardiology follow-up. Aspirin and Plavix x30 days then aspirin indefinitely endocarditis prophylaxis x6 months  Saw cardiology as an outpatient August 02, 2021  Review of Systems Since she left the hospital she is home, doing great. No fever chills No chest pain or difficulty breathing Appetite is very good. Not drinking alcohol.  Past Medical History:  Diagnosis Date   Anxiety    Breast cancer ([place]) fall 1997    Left Lumpectomy, XRT.  treated with Tamoxifem and Evista   Depression    Endometriosis in her 20's   surgery to help clear endometriosis to obtain a pregnancy.   GERD (gastroesophageal reflux disease)    w/"stretching" remotely   HTN (hypertension)    Migraines     Past Surgical History:  Procedure Laterality Date   BREAST LUMPECTOMY Left fall 1997   Lymph nodes X 3 were negative, ER+, Radiation treatmetn.  Took Tamoxifem X 5 yrs then Evista for 3-5 yrs.   CESAREAN SECTION     x2  first pregnancy breach and second  normal   PATENT FORAMEN OVALE(PFO) CLOSURE N/A 07/12/2021   Procedure: PATENT FORAMEN OVALE(PFO) CLOSURE;  Surgeon: Adrian Prows, MD;  Location: [place] [facility];  Service: Cardiovascular;  Laterality: N/A;   RIGHT HEART CATH N/A 07/12/2021   Procedure: RIGHT HEART CATH;  Surgeon: Adrian Prows, MD;  Location: [place] [facility];  Service: Cardiovascular;  Laterality: N/A;   surgery for endometriosis      Current Outpatient Medications  Medication Instructions   aspirin EC 81 mg, Oral, Daily, Swallow whole.   benzonatate (TESSALON) 100 mg, 3 times daily PRN   clopidogrel (PLAVIX) 75 mg, Oral, Daily with breakfast   escitalopram (LEXAPRO) 20 mg, Oral, Daily   folic acid (FOLVITE) 1 mg, Oral, Daily   metoprolol tartrate (LOPRESSOR) 12.5 mg, Oral, 2 times daily   Multiple Vitamin (MULTIVITAMIN WITH MINERALS) TABS tablet 1 tablet, Oral, Daily   Nystatin (GERHARDT'S BUTT CREAM) CREA 1 Application, Topical, 3 times daily   OLANZapine (ZYPREXA) 7.5 MG tablet TAKE ONE TABLET BY MOUTH DAILY AT BEDTIME   pantoprazole (PROTONIX) 40 mg, Oral, Daily   thiamine 100 mg, Oral, Daily       Objective:   Physical Exam BP 116/74   Pulse 94   Temp 98.6 F (37 C) (Oral)   Resp 18   Ht '5\' 3"'$  (1.6 m)   Wt 120 lb 8 oz (54.7 kg)   LMP 01/22/1992 (Within Months)   SpO2 96%   BMI 21.35 kg/m  General:   Well developed, NAD, BMI noted. HEENT:  Normocephalic . Face symmetric, atraumatic Lungs:  CTA B Normal respiratory effort, no intercostal retractions, no accessory muscle use. Heart: RRR,  no murmur.  Lower extremities: Has a boot at the right leg Skin: Not pale. Not jaundice Neurologic:  alert & oriented X3.  Speech normal, gait not tested, sitting in a wheelchair, seems comfortable. Psych: Cognition and judgment appear intact.  Cooperative with normal attention span and concentration.  Behavior appropriate. No anxious or depressed appearing.      Assessment       Assessment HTN Depression, Anxiety - Dr Clovis Pu  Tremor dx [phone] Gait d/o: unsteady onset ~ 2017 GERD with esophageal stricture L Breast cancer, 1997, lumpectomy, XRT, released from oncology ETOH Stroke per MRI brain (2021), rx ASA per neuro Fatty liver per [place]  03/07/2020 Hypoxemia: Hypoxemia: Found on admission 09/30/2019.  On nocturnal oxygen as of 07-2020.  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 Admitted with the following issues. -Acute on chronic hypoxic respiratory failure secondary to intra-atrial shunt, closed  07/13/2021, hypoxia resolved. -Right ankle fracture -EtOH abuse -UTI. Since she left rehab, he is back home, lives with her husband and one of her sons. 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 Currently getting PT ( OT pending) Also has a personal caregiver from 9am-5 pm daily. Appetite is very good, she is not drinking etoh at all. Good med compliance. BP ranges from 105/64 to 129/81, heart rate: 80-90.  O2 sat 94% - 97%. Not needing xyprexa for sleep Will finish her 30 days of Plavix tomorrow and will continue with aspirin. Will need antibiotics for endocarditis prophylaxis x6 months Overall I think she is doing great, we agree on no blood work today. Next visit 3 months  Time spent 37 minutes reviewing the chart, listening the patient and her daughter concerns.

## 2021-08-11 ENCOUNTER — Encounter: Payer: Self-pay | Admitting: Internal Medicine

## 2021-08-14 ENCOUNTER — Ambulatory Visit: Payer: Medicare Other | Admitting: Internal Medicine

## 2021-08-17 ENCOUNTER — Telehealth: Payer: Self-pay | Admitting: Internal Medicine

## 2021-08-17 NOTE — Telephone Encounter (Signed)
Caller/Agency: [facility] Number: [phone] ok for VM Requesting OT/PT/Skilled Nursing/Social Work/Speech Therapy: OT Frequency: 1 x 2, 2 x 2

## 2021-08-17 NOTE — Telephone Encounter (Signed)
LMOM w/ verbal orders for OT.

## 2021-08-20 ENCOUNTER — Encounter (HOSPITAL_COMMUNITY): Payer: Self-pay | Admitting: Neurology

## 2021-08-20 ENCOUNTER — Inpatient Hospital Stay (HOSPITAL_COMMUNITY)
Admission: EM | Admit: 2021-08-20 | Discharge: 2021-08-22 | DRG: 314 | Disposition: A | Discharge status: Home / Self Care | Payer: Medicare Other | Attending: Neurology | Admitting: Neurology

## 2021-08-20 ENCOUNTER — Telehealth: Payer: Self-pay | Admitting: Internal Medicine

## 2021-08-20 ENCOUNTER — Emergency Department (HOSPITAL_COMMUNITY): Payer: Medicare Other

## 2021-08-20 ENCOUNTER — Encounter: Payer: Self-pay | Admitting: Internal Medicine

## 2021-08-20 ENCOUNTER — Inpatient Hospital Stay (HOSPITAL_COMMUNITY): Payer: Medicare Other

## 2021-08-20 DIAGNOSIS — F1020 Alcohol dependence, uncomplicated: Secondary | ICD-10-CM | POA: Diagnosis present

## 2021-08-20 DIAGNOSIS — I6381 Other cerebral infarction due to occlusion or stenosis of small artery: Secondary | ICD-10-CM | POA: Diagnosis present

## 2021-08-20 DIAGNOSIS — F0390 Unspecified dementia without behavioral disturbance: Secondary | ICD-10-CM | POA: Diagnosis present

## 2021-08-20 DIAGNOSIS — R29705 NIHSS score 5: Secondary | ICD-10-CM | POA: Diagnosis present

## 2021-08-20 DIAGNOSIS — F419 Anxiety disorder, unspecified: Secondary | ICD-10-CM | POA: Diagnosis present

## 2021-08-20 DIAGNOSIS — I63233 Cerebral infarction due to unspecified occlusion or stenosis of bilateral carotid arteries: Secondary | ICD-10-CM | POA: Diagnosis not present

## 2021-08-20 DIAGNOSIS — T82867A Thrombosis of cardiac prosthetic devices, implants and grafts, initial encounter: Principal | ICD-10-CM | POA: Diagnosis present

## 2021-08-20 DIAGNOSIS — I639 Cerebral infarction, unspecified: Secondary | ICD-10-CM | POA: Diagnosis present

## 2021-08-20 DIAGNOSIS — Z8774 Personal history of (corrected) congenital malformations of heart and circulatory system: Secondary | ICD-10-CM

## 2021-08-20 DIAGNOSIS — Z823 Family history of stroke: Secondary | ICD-10-CM

## 2021-08-20 DIAGNOSIS — Z79899 Other long term (current) drug therapy: Secondary | ICD-10-CM

## 2021-08-20 DIAGNOSIS — K219 Gastro-esophageal reflux disease without esophagitis: Secondary | ICD-10-CM | POA: Diagnosis present

## 2021-08-20 DIAGNOSIS — Y838 Other surgical procedures as the cause of abnormal reaction of the patient, or of later complication, without mention of misadventure at the time of the procedure: Secondary | ICD-10-CM | POA: Diagnosis present

## 2021-08-20 DIAGNOSIS — G43909 Migraine, unspecified, not intractable, without status migrainosus: Secondary | ICD-10-CM | POA: Diagnosis present

## 2021-08-20 DIAGNOSIS — F32A Depression, unspecified: Secondary | ICD-10-CM | POA: Diagnosis present

## 2021-08-20 DIAGNOSIS — I480 Paroxysmal atrial fibrillation: Secondary | ICD-10-CM | POA: Diagnosis present

## 2021-08-20 DIAGNOSIS — R0902 Hypoxemia: Secondary | ICD-10-CM | POA: Diagnosis not present

## 2021-08-20 DIAGNOSIS — I10 Essential (primary) hypertension: Secondary | ICD-10-CM | POA: Diagnosis present

## 2021-08-20 DIAGNOSIS — Z7982 Long term (current) use of aspirin: Secondary | ICD-10-CM | POA: Diagnosis not present

## 2021-08-20 DIAGNOSIS — Z880 Allergy status to penicillin: Secondary | ICD-10-CM | POA: Diagnosis not present

## 2021-08-20 DIAGNOSIS — R29818 Other symptoms and signs involving the nervous system: Secondary | ICD-10-CM | POA: Diagnosis not present

## 2021-08-20 DIAGNOSIS — Z87891 Personal history of nicotine dependence: Secondary | ICD-10-CM | POA: Diagnosis not present

## 2021-08-20 DIAGNOSIS — E785 Hyperlipidemia, unspecified: Secondary | ICD-10-CM | POA: Diagnosis present

## 2021-08-20 DIAGNOSIS — Z853 Personal history of malignant neoplasm of breast: Secondary | ICD-10-CM | POA: Diagnosis not present

## 2021-08-20 DIAGNOSIS — R296 Repeated falls: Secondary | ICD-10-CM | POA: Diagnosis present

## 2021-08-20 DIAGNOSIS — K222 Esophageal obstruction: Secondary | ICD-10-CM | POA: Diagnosis present

## 2021-08-20 DIAGNOSIS — Z8249 Family history of ischemic heart disease and other diseases of the circulatory system: Secondary | ICD-10-CM

## 2021-08-20 DIAGNOSIS — I3139 Other pericardial effusion (noninflammatory): Secondary | ICD-10-CM | POA: Diagnosis not present

## 2021-08-20 DIAGNOSIS — R4701 Aphasia: Secondary | ICD-10-CM | POA: Diagnosis present

## 2021-08-20 HISTORY — DX: Cerebral infarction, unspecified: I63.9

## 2021-08-20 LAB — RAPID URINE DRUG SCREEN, HOSP PERFORMED
Amphetamines: NOT DETECTED
Barbiturates: NOT DETECTED
Benzodiazepines: NOT DETECTED
Cocaine: NOT DETECTED
Opiates: NOT DETECTED
Tetrahydrocannabinol: NOT DETECTED

## 2021-08-20 LAB — CBG MONITORING, ED: Glucose-Capillary: 96 mg/dL (ref 70–99)

## 2021-08-20 LAB — URINALYSIS, ROUTINE W REFLEX MICROSCOPIC
Bilirubin Urine: NEGATIVE
Glucose, UA: NEGATIVE mg/dL
Hgb urine dipstick: NEGATIVE
Ketones, ur: NEGATIVE mg/dL
Leukocytes,Ua: NEGATIVE
Nitrite: NEGATIVE
Protein, ur: NEGATIVE mg/dL
Specific Gravity, Urine: 1.038 — ABNORMAL HIGH (ref 1.005–1.030)
pH: 8 (ref 5.0–8.0)

## 2021-08-20 LAB — COMPREHENSIVE METABOLIC PANEL
ALT: 12 U/L (ref 0–44)
AST: 20 U/L (ref 15–41)
Albumin: 3.6 g/dL (ref 3.5–5.0)
Alkaline Phosphatase: 67 U/L (ref 38–126)
Anion gap: 9 (ref 5–15)
BUN: 8 mg/dL (ref 8–23)
CO2: 19 mmol/L — ABNORMAL LOW (ref 22–32)
Calcium: 9.2 mg/dL (ref 8.9–10.3)
Chloride: 108 mmol/L (ref 98–111)
Creatinine, Ser: 0.71 mg/dL (ref 0.44–1.00)
GFR, Estimated: >60 mL/min (ref >60)
Glucose, Bld: 98 mg/dL (ref 70–99)
Potassium: 3.8 mmol/L (ref 3.5–5.1)
Sodium: 136 mmol/L (ref 135–145)
Total Bilirubin: 0.8 mg/dL (ref 0.3–1.2)
Total Protein: 6.8 g/dL (ref 6.5–8.1)

## 2021-08-20 LAB — CBC
HCT: 39.4 % (ref 36.0–46.0)
Hemoglobin: 13.6 g/dL (ref 12.0–15.0)
MCH: 31.1 pg (ref 26.0–34.0)
MCHC: 34.5 g/dL (ref 30.0–36.0)
MCV: 90 fL (ref 80.0–100.0)
Platelets: 348 10*3/uL (ref 150–400)
RBC: 4.38 MIL/uL (ref 3.87–5.11)
RDW: 13.1 % (ref 11.5–15.5)
WBC: 10.2 10*3/uL (ref 4.0–10.5)
nRBC: 0 % (ref 0.0–0.2)

## 2021-08-20 LAB — I-STAT CHEM 8, ED
BUN: 7 mg/dL — ABNORMAL LOW (ref 8–23)
Calcium, Ion: 1.09 mmol/L — ABNORMAL LOW (ref 1.15–1.40)
Chloride: 106 mmol/L (ref 98–111)
Creatinine, Ser: 0.6 mg/dL (ref 0.44–1.00)
Glucose, Bld: 98 mg/dL (ref 70–99)
HCT: 41 % (ref 36.0–46.0)
Hemoglobin: 13.9 g/dL (ref 12.0–15.0)
Potassium: 3.8 mmol/L (ref 3.5–5.1)
Sodium: 138 mmol/L (ref 135–145)
TCO2: 19 mmol/L — ABNORMAL LOW (ref 22–32)

## 2021-08-20 LAB — APTT: aPTT: 40 seconds — ABNORMAL HIGH (ref 24–36)

## 2021-08-20 LAB — DIFFERENTIAL
Abs Immature Granulocytes: 0.04 10*3/uL (ref 0.00–0.07)
Basophils Absolute: 0 10*3/uL (ref 0.0–0.1)
Basophils Relative: 0 %
Eosinophils Absolute: 0.1 10*3/uL (ref 0.0–0.5)
Eosinophils Relative: 1 %
Immature Granulocytes: 0 %
Lymphocytes Relative: 28 %
Lymphs Abs: 2.8 10*3/uL (ref 0.7–4.0)
Monocytes Absolute: 1.8 10*3/uL — ABNORMAL HIGH (ref 0.1–1.0)
Monocytes Relative: 18 %
Neutro Abs: 5.4 10*3/uL (ref 1.7–7.7)
Neutrophils Relative %: 53 %

## 2021-08-20 LAB — MRSA NEXT GEN BY PCR, NASAL: MRSA by PCR Next Gen: DETECTED — AB

## 2021-08-20 LAB — HEMOGLOBIN A1C
Hgb A1c MFr Bld: 4.7 % — ABNORMAL LOW (ref 4.8–5.6)
Mean Plasma Glucose: 88.19 mg/dL

## 2021-08-20 LAB — GLUCOSE, CAPILLARY
Glucose-Capillary: 91 mg/dL (ref 70–99)
Glucose-Capillary: 92 mg/dL (ref 70–99)

## 2021-08-20 LAB — PROTIME-INR
INR: 1.2 (ref 0.8–1.2)
Prothrombin Time: 14.9 seconds (ref 11.4–15.2)

## 2021-08-20 LAB — ETHANOL: Alcohol, Ethyl (B): <10 mg/dL (ref <10)

## 2021-08-20 MED ORDER — TENECTEPLASE FOR STROKE
0.2500 mg/kg | PACK | Freq: Once | INTRAVENOUS | Status: AC
  Administered 2021-08-20: 14 mg via INTRAVENOUS
  Filled 2021-08-20: qty 10

## 2021-08-20 MED ORDER — ACETAMINOPHEN 325 MG PO TABS: 650.0000 mg | ORAL_TABLET | ORAL | Status: DC | PRN

## 2021-08-20 MED ORDER — CHLORHEXIDINE GLUCONATE CLOTH 2 % EX PADS
6.0000 | MEDICATED_PAD | Freq: Every day | CUTANEOUS | Status: DC
  Administered 2021-08-20 – 2021-08-21 (×2): 6 via TOPICAL

## 2021-08-20 MED ORDER — ESCITALOPRAM OXALATE 10 MG PO TABS
20.0000 mg | ORAL_TABLET | Freq: Every day | ORAL | Status: DC
  Administered 2021-08-21 – 2021-08-22 (×2): 20 mg via ORAL
  Filled 2021-08-20 (×2): qty 2

## 2021-08-20 MED ORDER — SODIUM CHLORIDE 0.9% FLUSH
3.0000 mL | Freq: Once | INTRAVENOUS | Status: AC
  Administered 2021-08-20: 3 mL via INTRAVENOUS

## 2021-08-20 MED ORDER — LABETALOL HCL 5 MG/ML IV SOLN: 20.0000 mg | Freq: Once | INTRAVENOUS | Status: DC

## 2021-08-20 MED ORDER — SENNOSIDES-DOCUSATE SODIUM 8.6-50 MG PO TABS: 1.0000 | ORAL_TABLET | Freq: Every evening | ORAL | Status: DC | PRN

## 2021-08-20 MED ORDER — ACETAMINOPHEN 160 MG/5ML PO SOLN: 650.0000 mg | ORAL | Status: DC | PRN

## 2021-08-20 MED ORDER — CLEVIDIPINE BUTYRATE 0.5 MG/ML IV EMUL: 0.0000 mg/h | INTRAVENOUS | Status: DC

## 2021-08-20 MED ORDER — IOHEXOL 350 MG/ML SOLN
75.0000 mL | Freq: Once | INTRAVENOUS | Status: AC | PRN
  Administered 2021-08-20: 75 mL via INTRAVENOUS

## 2021-08-20 MED ORDER — STROKE: EARLY STAGES OF RECOVERY BOOK
Freq: Once | Status: AC
  Filled 2021-08-20 (×2): qty 1

## 2021-08-20 MED ORDER — ACETAMINOPHEN 650 MG RE SUPP: 650.0000 mg | RECTAL | Status: DC | PRN

## 2021-08-20 MED ORDER — SODIUM CHLORIDE 0.9 % IV SOLN: INTRAVENOUS | Status: DC

## 2021-08-20 MED ORDER — PANTOPRAZOLE SODIUM 40 MG IV SOLR
40.0000 mg | Freq: Every day | INTRAVENOUS | Status: DC
  Administered 2021-08-20 – 2021-08-21 (×2): 40 mg via INTRAVENOUS
  Filled 2021-08-20 (×2): qty 10

## 2021-08-20 NOTE — Evaluation (Signed)
Speech Language Pathology Evaluation Patient Details Name: Monique Ballard MRN: [id number] DOB: 1940-08-21 Today's Date: 08/20/2021 Time: 1510-1530 SLP Time Calculation (min) (ACUTE ONLY): 20 min  Problem List:  Patient Active Problem List   Diagnosis Date Noted   Acute ischemic stroke ([place]) 08/20/2021   Closed fracture of lateral malleolus of right fibula 08/07/2021   Debility 07/15/2021   Platypnea-orthodeoxia syndrome    Polycythemia vera, acquired ([place])    Hypoxia 07/08/2021   Nocturnal hypoxia 07/26/2020   Alcohol dependency ([place]) 05/15/2018   Tremor 02/24/2015   Gait disorder 02/24/2015   PCP NOTES >>>>>>> 12/13/2014   Osteoporosis 10/04/2011   Annual physical exam 09/07/2010   GERD 08/28/2009   Anxiety and depression 10/06/2006   Essential hypertension 10/06/2006   BREAST CANCER, HX OF 10/06/2006   Past Medical History:  Past Medical History:  Diagnosis Date   Anxiety    Breast cancer ([place]) fall 1997    Left Lumpectomy, XRT.  treated with Tamoxifem and Evista   Depression    Endometriosis in her 20's   surgery to help clear endometriosis to obtain a pregnancy.   GERD (gastroesophageal reflux disease)    w/"stretching" remotely   HTN (hypertension)    Migraines    Past Surgical History:  Past Surgical History:  Procedure Laterality Date   BREAST LUMPECTOMY Left fall 1997   Lymph nodes X 3 were negative, ER+, Radiation treatmetn.  Took Tamoxifem X 5 yrs then Evista for 3-5 yrs.   CESAREAN SECTION     x2  first pregnancy breach and second normal   PATENT FORAMEN OVALE(PFO) CLOSURE N/A 07/12/2021   Procedure: PATENT FORAMEN OVALE(PFO) CLOSURE;  Surgeon: Adrian Prows, MD;  Location: [facility];  Service: Cardiovascular;  Laterality: N/A;   RIGHT HEART CATH N/A 07/12/2021   Procedure: RIGHT HEART CATH;  Surgeon: Adrian Prows, MD;  Location: [facility];  Service: Cardiovascular;  Laterality: N/A;   surgery for endometriosis     HPI:  81yo female  admitted 08/20/21 with confusion and aphasia. PMH: PFO closure in June, HTN, dementia, breast cancer, migraines, frequent falls, alcohol dependence. CTHead = no acute intracraniall abnormality. MRI pending   Assessment / Plan / Recommendation Clinical Impression  Pt seen at bedside to assess receptive and expressive language skills. At baseline, pt has a history of mild dementia. Pt reports having a college education. She lives in a house with her husband, who assists with pt's medications and manages the finances. Pt is no longer driving. Pt's receptive language appears intact. She was able to answer complex/abstract yes/no questions and follow 3-step commands. Expressive language is characterized by mild high level word retrieval deficits. Pt able to provide automatic sequences with minimal error (she repeated January when listing months of the year). Pt was able to repeat sentence length material accurately. Responsive naming was 100%. Confrontation naming was 80%, with mild hesitation noted. Verbal fluency is impaired, as pt was able to name only 4 animals in 60 seconds. Pt would benefit from continued skilled ST intervention, targeting higher level naming and open ended word finding tasks. If pt is DC'd from ED, recommend home health or outpatient speech therapy.    SLP Assessment  SLP Recommendation/Assessment: Patient needs continued Speech Language Pathology Services  SLP Visit Diagnosis: Aphasia (R47.01)    Recommendations for follow up therapy are one component of a multi-disciplinary discharge planning process, led by the attending physician.  Recommendations may be updated based on patient status, additional functional  criteria and insurance authorization.    Follow Up Recommendations  Follow physician's recommendations for discharge plan and follow up therapies    Assistance Recommended at Discharge  Frequent or constant Supervision/Assistance  Functional Status Assessment Patient has  had a recent decline in their functional status and/or demonstrates limited ability to make significant improvements in function in a reasonable and predictable amount of time  Frequency and Duration min 1 x/week  2 weeks      SLP Evaluation Cognition  Overall Cognitive Status: History of cognitive impairments - at baseline Arousal/Alertness: Awake/alert Orientation Level: Oriented to person;Oriented to time       Comprehension  Auditory Comprehension Overall Auditory Comprehension: Appears within functional limits for tasks assessed Yes/No Questions: Within Functional Limits Commands: Within Functional Limits Conversation: Simple Interfering Components: Working Engineer, building services Reading Status: Not tested    Expression Expression Primary Mode of Expression: Verbal Verbal Expression Overall Verbal Expression: Appears within functional limits for tasks assessed Initiation: No impairment Automatic Speech: Name;Counting;Day of week;Month of year Level of Generative/Spontaneous Verbalization: Sentence Repetition: No impairment Naming: Impairment Responsive: 76-100% accurate Confrontation: Impaired Convergent: 75-100% accurate Divergent: 75-100% accurate Pragmatics: No impairment Written Expression Dominant Hand: Right Written Expression: Not tested   Oral / Motor  Oral Motor/Sensory Function Overall Oral Motor/Sensory Function: Within functional limits Motor Speech Overall Motor Speech: Appears within functional limits for tasks assessed Respiration: Within functional limits Phonation: Normal Resonance: Within functional limits Articulation: Within functional limitis Intelligibility: Intelligible Motor Planning: Witnin functional limits Motor Speech Errors: Not applicable           Cherelle Midkiff B. Quentin Ore, [facility], [place] Speech Language Pathologist Office: [phone]  Shonna Chock 08/20/2021, 3:49 PM

## 2021-08-20 NOTE — ED Provider Notes (Signed)
[facility] EMERGENCY DEPARTMENT Provider Note   CSN: [id number] Arrival date & time: 08/20/21  1312  An emergency department physician performed an initial assessment on this suspected stroke patient at 1315.  History  Chief Complaint  Patient presents with   Code Stroke    Monique Ballard is a 81 y.o. female with history of dementia presenting from home with concern for expressive aphasia, onset around 10 AM, witnessed by her daughter.  Patient arrives as a code stroke.  She is not able to speak coherently.  She is able to follow commands.  HPI     Home Medications Prior to Admission medications   Medication Sig Start Date End Date Taking? Authorizing Provider  aspirin EC 81 MG tablet Take 1 tablet (81 mg total) by mouth daily. Swallow whole. 12/27/19   Melvenia Beam, MD  escitalopram (LEXAPRO) 20 MG tablet Take 1 tablet (20 mg total) by mouth daily. 07/27/21   Setzer, Edman Circle, PA-C  folic acid (FOLVITE) 1 MG tablet Take 1 tablet (1 mg total) by mouth daily. 07/27/21   Setzer, Edman Circle, PA-C  metoprolol tartrate (LOPRESSOR) 25 MG tablet Take 0.5 tablets (12.5 mg total) by mouth 2 (two) times daily. 07/27/21   Setzer, Edman Circle, PA-C  Multiple Vitamin (MULTIVITAMIN WITH MINERALS) TABS tablet Take 1 tablet by mouth daily. 07/28/21   Setzer, Edman Circle, PA-C  Nystatin (GERHARDT'S BUTT CREAM) CREA Apply 1 Application topically 3 (three) times daily. 07/27/21   Setzer, Edman Circle, PA-C  OLANZapine (ZYPREXA) 7.5 MG tablet TAKE ONE TABLET BY MOUTH DAILY AT BEDTIME Patient not taking: Reported on 08/10/2021 12/20/19   Purnell Shoemaker., MD  pantoprazole (PROTONIX) 40 MG tablet Take 1 tablet (40 mg total) by mouth daily. 07/27/21   Setzer, Edman Circle, PA-C  thiamine 100 MG tablet Take 1 tablet (100 mg total) by mouth daily. 07/27/21   Setzer, Edman Circle, PA-C      Allergies    Penicillins    Review of Systems   Review of Systems  Physical Exam Updated Vital Signs BP (!) 181/99    Pulse 62   Resp 15   Wt 56.6 kg   LMP 01/22/1992 (Within Months)   SpO2 98%   BMI 22.10 kg/m  Physical Exam  ED Results / Procedures / Treatments   Labs (all labs ordered are listed, but only abnormal results are displayed) Labs Reviewed  APTT - Abnormal; Notable for the following components:      Result Value   aPTT 40 (*)    All other components within normal limits  DIFFERENTIAL - Abnormal; Notable for the following components:   Monocytes Absolute 1.8 (*)    All other components within normal limits  COMPREHENSIVE METABOLIC PANEL - Abnormal; Notable for the following components:   CO2 19 (*)    All other components within normal limits  HEMOGLOBIN A1C - Abnormal; Notable for the following components:   Hgb A1c MFr Bld 4.7 (*)    All other components within normal limits  URINALYSIS, ROUTINE W REFLEX MICROSCOPIC - Abnormal; Notable for the following components:   Specific Gravity, Urine 1.038 (*)    All other components within normal limits  I-STAT CHEM 8, ED - Abnormal; Notable for the following components:   BUN 7 (*)    Calcium, Ion 1.09 (*)    TCO2 19 (*)    All other components within normal limits  PROTIME-INR  CBC  ETHANOL  RAPID URINE  DRUG SCREEN, HOSP PERFORMED  CBG MONITORING, ED  CBG MONITORING, ED    EKG None  Radiology CT HEAD CODE STROKE WO CONTRAST  Result Date: 08/20/2021 CLINICAL DATA:  Code stroke. Neuro deficit, acute, stroke suspected. Isolated aphasia EXAM: CT ANGIOGRAPHY HEAD AND NECK TECHNIQUE: Multidetector CT imaging of the head and neck was performed using the standard protocol during bolus administration of intravenous contrast. Multiplanar CT image reconstructions and MIPs were obtained to evaluate the vascular anatomy. Carotid stenosis measurements (when applicable) are obtained utilizing NASCET criteria, using the distal internal carotid diameter as the denominator. RADIATION DOSE REDUCTION: This exam was performed according to the  departmental dose-optimization program which includes automated exposure control, adjustment of the mA and/or kV according to patient size and/or use of iterative reconstruction technique. CONTRAST:  Administered contrast not known at this time. COMPARISON:  Brain MRI 11/15/2019. Head CT 10/30/2019. Chest CT 07/08/2021. FINDINGS: CT HEAD FINDINGS Mildly motion degraded exam. Brain: Moderate generalized parenchymal atrophy. A small chronic cortical infarct within the left parietal lobe was better appreciated on the prior brain MRI of 11/15/2019. Redemonstrated chronic lacunar infarct within the right caudate nucleus Mild patchy and ill-defined hypoattenuation within the cerebral white matter, nonspecific but compatible chronic small vessel ischemic disease. Redemonstrated small chronic infarct within the right cerebellar hemisphere. There is no acute intracranial hemorrhage. No demarcated cortical infarct. No extra-axial fluid collection. No evidence of an intracranial mass. No midline shift. Vascular: No hyperdense vessel. Atherosclerotic calcifications. Skull: No fracture or aggressive osseous lesion. Sinuses/Orbits: No mass or acute finding within the imaged orbits. No significant paranasal sinus disease. ASPECTS [facility] Stroke Program Early CT Score) Ganglionic level infarction (caudate, lentiform nuclei, internal capsule, insula, M1-M3 cortex): 7 Supraganglionic infarction (M4-M6 cortex): 3 Total score (0-10 with 10 being normal): 10 Other: Trace fluid within the left mastoid air cells. No evidence of acute intracranial abnormality. These results were called by telephone at the time of interpretation on 08/20/2021 at 1:29 pm to provider [facility] , who verbally acknowledged these results. CTA NECK FINDINGS Aortic arch: Standard aortic branching. Atherosclerotic plaque within the visualized aortic arch and proximal major branch vessels of the neck. No hemodynamically significant innominate or proximal  subclavian artery stenosis. Right carotid system: CCA and ICA patent within the neck without hemodynamically significant stenosis (50% or greater). Mild atherosclerotic plaque within the carotid bulb. Left carotid system: CCA and ICA patent within the neck without hemodynamically significant stenosis (50% or greater). Mild to moderate atherosclerotic plaque within the carotid bulb. 4 mm posteriorly directed pseudoaneurysm arising from the distal cervical ICA (series 100, image 77). Vertebral arteries: Vertebral arteries patent within the neck without significant stenosis. Skeleton: Cervical spondylosis. Facet joint ankylosis on the right at C2-C3 and bilaterally at C3-C4. Chronic T3 superior endplate vertebral compression deformity (with mild height loss). Other neck: No neck mass or cervical lymphadenopathy. Upper chest: No consolidation within the imaged lung apices. Review of the MIP images confirms the above findings CTA HEAD FINDINGS Mildly motion degraded exam. Anterior circulation: The intracranial internal carotid arteries are patent. The M1 middle cerebral arteries are patent. Atherosclerotic irregularity of the M2 and more distal MCA vessels, bilaterally. No M2 proximal branch occlusion or high-grade proximal stenosis is identified. The anterior cerebral arteries are patent. Atherosclerotic irregularity of both vessels without high-grade proximal stenosis. No intracranial aneurysm is identified. Posterior circulation: The intracranial vertebral arteries are patent. Apparent focus of ill-defined hypoattenuation within the mid basilar artery (for instance as seen on series 7, image  118). This may reflect a fenestration or moderate stenosis. However, a small subocclusive thrombus at this site is difficult to exclude. The posterior cerebral arteries are patent. The left PCA is fetal in origin. A small right posterior communicating artery is present. Venous sinuses: Contrast timing limits evaluation of the  dural venous sinuses. Anatomic variants: As described. Review of the MIP images confirms the above findings No intracranial large vessel occlusion identified. This finding, and CTA head impression #2 called by telephone at the time of interpretation on 08/20/2021 at 2:13 pm to provider [facility] , who verbally acknowledged these results. IMPRESSION: CT head: 1. Mildly motion degraded exam. 2. No evidence of acute intracranial abnormality. 3. Chronic infarcts within the left parietal cortex, right basal ganglia and right cerebellar hemisphere. 4. Mild chronic small vessel ischemic changes within the cerebral white matter. 5. Moderate generalized atrophy. CTA Neck: 1. The common carotid, internal carotid and vertebral arteries are patent within the neck without hemodynamically significant stenosis (50% or greater). Atherosclerotic plaque within the left greater than right carotid bulbs. 2. 4 mm posteriorly projecting pseudoaneurysm arising from the distal cervical left ICA. 3. Aortic Atherosclerosis (ICD10-I70.0). CTA head: 1. No intracranial large vessel occlusion is identified. 2. Apparent focus of ill-defined hypoattenuation within the mid basilar artery. While this may reflect a fenestration or a moderate atherosclerotic stenosis, a small subocclusive thrombus cannot be excluded. An MRA of the head is recommended for further evaluation. Electronically Signed   By: Kellie Simmering D.O.   On: 08/20/2021 14:14   CT ANGIO HEAD NECK W WO CM (CODE STROKE)  Result Date: 08/20/2021 CLINICAL DATA:  Code stroke. Neuro deficit, acute, stroke suspected. Isolated aphasia EXAM: CT ANGIOGRAPHY HEAD AND NECK TECHNIQUE: Multidetector CT imaging of the head and neck was performed using the standard protocol during bolus administration of intravenous contrast. Multiplanar CT image reconstructions and MIPs were obtained to evaluate the vascular anatomy. Carotid stenosis measurements (when applicable) are obtained utilizing NASCET  criteria, using the distal internal carotid diameter as the denominator. RADIATION DOSE REDUCTION: This exam was performed according to the departmental dose-optimization program which includes automated exposure control, adjustment of the mA and/or kV according to patient size and/or use of iterative reconstruction technique. CONTRAST:  Administered contrast not known at this time. COMPARISON:  Brain MRI 11/15/2019. Head CT 10/30/2019. Chest CT 07/08/2021. FINDINGS: CT HEAD FINDINGS Mildly motion degraded exam. Brain: Moderate generalized parenchymal atrophy. A small chronic cortical infarct within the left parietal lobe was better appreciated on the prior brain MRI of 11/15/2019. Redemonstrated chronic lacunar infarct within the right caudate nucleus Mild patchy and ill-defined hypoattenuation within the cerebral white matter, nonspecific but compatible chronic small vessel ischemic disease. Redemonstrated small chronic infarct within the right cerebellar hemisphere. There is no acute intracranial hemorrhage. No demarcated cortical infarct. No extra-axial fluid collection. No evidence of an intracranial mass. No midline shift. Vascular: No hyperdense vessel. Atherosclerotic calcifications. Skull: No fracture or aggressive osseous lesion. Sinuses/Orbits: No mass or acute finding within the imaged orbits. No significant paranasal sinus disease. ASPECTS [facility] Stroke Program Early CT Score) Ganglionic level infarction (caudate, lentiform nuclei, internal capsule, insula, M1-M3 cortex): 7 Supraganglionic infarction (M4-M6 cortex): 3 Total score (0-10 with 10 being normal): 10 Other: Trace fluid within the left mastoid air cells. No evidence of acute intracranial abnormality. These results were called by telephone at the time of interpretation on 08/20/2021 at 1:29 pm to provider [facility] , who verbally acknowledged these results. CTA NECK FINDINGS Aortic  arch: Standard aortic branching. Atherosclerotic plaque  within the visualized aortic arch and proximal major branch vessels of the neck. No hemodynamically significant innominate or proximal subclavian artery stenosis. Right carotid system: CCA and ICA patent within the neck without hemodynamically significant stenosis (50% or greater). Mild atherosclerotic plaque within the carotid bulb. Left carotid system: CCA and ICA patent within the neck without hemodynamically significant stenosis (50% or greater). Mild to moderate atherosclerotic plaque within the carotid bulb. 4 mm posteriorly directed pseudoaneurysm arising from the distal cervical ICA (series 100, image 77). Vertebral arteries: Vertebral arteries patent within the neck without significant stenosis. Skeleton: Cervical spondylosis. Facet joint ankylosis on the right at C2-C3 and bilaterally at C3-C4. Chronic T3 superior endplate vertebral compression deformity (with mild height loss). Other neck: No neck mass or cervical lymphadenopathy. Upper chest: No consolidation within the imaged lung apices. Review of the MIP images confirms the above findings CTA HEAD FINDINGS Mildly motion degraded exam. Anterior circulation: The intracranial internal carotid arteries are patent. The M1 middle cerebral arteries are patent. Atherosclerotic irregularity of the M2 and more distal MCA vessels, bilaterally. No M2 proximal branch occlusion or high-grade proximal stenosis is identified. The anterior cerebral arteries are patent. Atherosclerotic irregularity of both vessels without high-grade proximal stenosis. No intracranial aneurysm is identified. Posterior circulation: The intracranial vertebral arteries are patent. Apparent focus of ill-defined hypoattenuation within the mid basilar artery (for instance as seen on series 7, image 118). This may reflect a fenestration or moderate stenosis. However, a small subocclusive thrombus at this site is difficult to exclude. The posterior cerebral arteries are patent. The left PCA is  fetal in origin. A small right posterior communicating artery is present. Venous sinuses: Contrast timing limits evaluation of the dural venous sinuses. Anatomic variants: As described. Review of the MIP images confirms the above findings No intracranial large vessel occlusion identified. This finding, and CTA head impression #2 called by telephone at the time of interpretation on 08/20/2021 at 2:13 pm to provider [facility] , who verbally acknowledged these results. IMPRESSION: CT head: 1. Mildly motion degraded exam. 2. No evidence of acute intracranial abnormality. 3. Chronic infarcts within the left parietal cortex, right basal ganglia and right cerebellar hemisphere. 4. Mild chronic small vessel ischemic changes within the cerebral white matter. 5. Moderate generalized atrophy. CTA Neck: 1. The common carotid, internal carotid and vertebral arteries are patent within the neck without hemodynamically significant stenosis (50% or greater). Atherosclerotic plaque within the left greater than right carotid bulbs. 2. 4 mm posteriorly projecting pseudoaneurysm arising from the distal cervical left ICA. 3. Aortic Atherosclerosis (ICD10-I70.0). CTA head: 1. No intracranial large vessel occlusion is identified. 2. Apparent focus of ill-defined hypoattenuation within the mid basilar artery. While this may reflect a fenestration or a moderate atherosclerotic stenosis, a small subocclusive thrombus cannot be excluded. An MRA of the head is recommended for further evaluation. Electronically Signed   By: Kellie Simmering D.O.   On: 08/20/2021 14:14    Procedures .Critical Care  Performed by: Wyvonnia Dusky, MD Authorized by: Wyvonnia Dusky, MD   Critical care provider statement:    Critical care time (minutes):  30   Critical care time was exclusive of:  Separately billable procedures and treating other patients   Critical care was necessary to treat or prevent imminent or life-threatening deterioration of the  following conditions:  CNS failure or compromise   Critical care was time spent personally by me on the following activities:  Ordering  and performing treatments and interventions, ordering and review of laboratory studies, ordering and review of radiographic studies, pulse oximetry, review of old charts, examination of patient and evaluation of patient's response to treatment Comments:     Tnk for stroke evaluation     Medications Ordered in ED Medications   stroke: early stages of recovery book (has no administration in time range)  0.9 %  sodium chloride infusion ( Intravenous New Bag/Given 08/20/21 1419)  acetaminophen (TYLENOL) tablet 650 mg (has no administration in time range)    Or  acetaminophen (TYLENOL) 160 MG/5ML solution 650 mg (has no administration in time range)    Or  acetaminophen (TYLENOL) suppository 650 mg (has no administration in time range)  senna-docusate (Senokot-S) tablet 1 tablet (has no administration in time range)  pantoprazole (PROTONIX) injection 40 mg (has no administration in time range)  escitalopram (LEXAPRO) tablet 20 mg (has no administration in time range)  labetalol (NORMODYNE) injection 20 mg (has no administration in time range)    Followed by  clevidipine (CLEVIPREX) infusion 0.5 mg/mL (has no administration in time range)  sodium chloride flush (NS) 0.9 % injection 3 mL (3 mLs Intravenous Given 08/20/21 1336)  tenecteplase (TNKASE) injection for Stroke 14 mg (14 mg Intravenous Given 08/20/21 1331)  iohexol (OMNIPAQUE) 350 MG/ML injection 75 mL (75 mLs Intravenous Contrast Given 08/20/21 1338)    ED Course/ Medical Decision Making/ A&P Clinical Course as of 08/20/21 1625  Mon Aug 20, 2021  1325 Patient currently CT; neurologist dr Quinn Axe speaking to the daughter on phone [MT]    Clinical Course User Index [MT] Darcella Shiffman, Carola Rhine, MD                           Medical Decision Making Risk Decision regarding hospitalization.   Pt here with  aphasia, code stroke Neurologist present for consultation & discussed care & tnk administration, consented, with family members by phone.  Airway patent on arrival; no indication for intubation  CT head and neck performed and reviewed personally, no focal infarct, question of basilar lesion  Labs personally interpreted - no emergent findings  Ecg per my interpretation w/ NSR< no acute ischemic findings or emergent arrhythmias         Final Clinical Impression(s) / ED Diagnoses Final diagnoses:  None    Rx / DC Orders ED Discharge Orders     None         Terald Jump, Carola Rhine, MD 08/20/21 [phone]

## 2021-08-20 NOTE — H&P (Signed)
Neurology H&P and Stroke Code Note  Reason for admission: acute ischemic stroke s/p TNK Referring Physician: Dr. Winona Legato  CC: Speech disturbance  History is obtained from: Patient's daughter, EMS  HPI: Monique Ballard is a 81 y.o. female with a medical history significant for hypertension, dementia without behavioral disturbance, breast cancer s/p left lumpectomy, migraine headaches, frequent falls, alcohol dependence, and anxiety who presented to [facility] 7/31 for evaluation of sudden onset speech disturbance. Patient lives at home with her husband and uses a walker to ambulate at baseline with a right lower extremity boot due to a right ankle fracture in June of 2023. While she was hospitalized in June, she was noted to have dyspnea and hypoxia and was found to have a PFO and underwent a right heart cath with PFO closure by Dr. Einar Gip on July 12, 2021. She was discharged on aspirin and plavix for 30 days followed by aspirin monotherapy. She is not on therapeutic anticoagulation.  This morning, the patient was seen by her personal care assistant at 09:30 when she woke up and was noted to be in her usual state of health. Her daughter went to visit and noted that at 09:45 she was communicating normally while eating breakfast. At 10:00, her daughter noted that Monique Ballard's speech was altered and that she was not following commands. Her speech persisted prompting her daughter to call EMS for further evaluation. Of note, the patient's daughter states that about a week ago, Monique Ballard was under quite a bit of stress with her husband in the hospital and at that time, she had a very brief similar episode of speech disturbance that quickly resolved. Head CT NAICP, multiple remote ischemic infarcts.  LKW: 09:45 TNK given?: yes, TNK administered at 13:31 following attending neurologist's review of neuroimaging and neuroradiologist review. TNK risk and benefits were discussed with patient's daughter, Monique Ballard via  telephone and due to patient's aphasia, patient's daughter Monique Ballard gave consent for TNK administration.  IR Thrombectomy? No, angiography imaging reviewed without evidence of LVO Modified Rankin Scale: 3-Moderate disability-requires help but walks WITHOUT assistance  ROS: Unable to obtain due to language disturbance.   Past Medical History:  Diagnosis Date   Anxiety    Breast cancer ([place]) fall 1997    Left Lumpectomy, XRT.  treated with Tamoxifem and Evista   Depression    Endometriosis in her 20's   surgery to help clear endometriosis to obtain a pregnancy.   GERD (gastroesophageal reflux disease)    w/"stretching" remotely   HTN (hypertension)    Migraines    Past Surgical History:  Procedure Laterality Date   BREAST LUMPECTOMY Left fall 1997   Lymph nodes X 3 were negative, ER+, Radiation treatmetn.  Took Tamoxifem X 5 yrs then Evista for 3-5 yrs.   CESAREAN SECTION     x2  first pregnancy breach and second normal   PATENT FORAMEN OVALE(PFO) CLOSURE N/A 07/12/2021   Procedure: PATENT FORAMEN OVALE(PFO) CLOSURE;  Surgeon: Adrian Prows, MD;  Location: [place] [facility];  Service: Cardiovascular;  Laterality: N/A;   RIGHT HEART CATH N/A 07/12/2021   Procedure: RIGHT HEART CATH;  Surgeon: Adrian Prows, MD;  Location: [place] [facility];  Service: Cardiovascular;  Laterality: N/A;   surgery for endometriosis     Family History  Problem Relation Age of Onset   Hypertension Mother    Stroke Mother    Heart attack Father        elderly   Heart disease Father  CHF   Throat cancer Other    Breast cancer Neg Hx    Colon cancer Neg Hx    Diabetes Neg Hx    Neuropathy Neg Hx    Social History:   reports that she quit smoking about 33 years ago. Her smoking use included cigarettes. She has a 12.50 pack-year smoking history. She has never used smokeless tobacco. She reports that she does not currently use alcohol. She reports that she does not use drugs. Medications  Current  Facility-Administered Medications:    [START ON 08/21/2021]  stroke: early stages of recovery book, , Does not apply, Once, Toberman, Stevi W, NP   0.9 %  sodium chloride infusion, , Intravenous, Continuous, Toberman, Stevi W, NP   acetaminophen (TYLENOL) tablet 650 mg, 650 mg, Oral, Q4H PRN **OR** acetaminophen (TYLENOL) 160 MG/5ML solution 650 mg, 650 mg, Per Tube, Q4H PRN **OR** acetaminophen (TYLENOL) suppository 650 mg, 650 mg, Rectal, Q4H PRN, Ebbie Latus, Stevi W, NP   pantoprazole (PROTONIX) injection 40 mg, 40 mg, Intravenous, QHS, Toberman, Stevi W, NP   senna-docusate (Senokot-S) tablet 1 tablet, 1 tablet, Oral, QHS PRN, Rikki Spearing, NP  Current Outpatient Medications:    aspirin EC 81 MG tablet, Take 1 tablet (81 mg total) by mouth daily. Swallow whole., Disp: 30 tablet, Rfl: 11   escitalopram (LEXAPRO) 20 MG tablet, Take 1 tablet (20 mg total) by mouth daily., Disp: 30 tablet, Rfl: 0   folic acid (FOLVITE) 1 MG tablet, Take 1 tablet (1 mg total) by mouth daily., Disp: 30 tablet, Rfl: 0   metoprolol tartrate (LOPRESSOR) 25 MG tablet, Take 0.5 tablets (12.5 mg total) by mouth 2 (two) times daily., Disp: 30 tablet, Rfl: 0   Multiple Vitamin (MULTIVITAMIN WITH MINERALS) TABS tablet, Take 1 tablet by mouth daily., Disp: , Rfl:    Nystatin (GERHARDT'S BUTT CREAM) CREA, Apply 1 Application topically 3 (three) times daily., Disp: , Rfl:    OLANZapine (ZYPREXA) 7.5 MG tablet, TAKE ONE TABLET BY MOUTH DAILY AT BEDTIME (Patient not taking: Reported on 08/10/2021), Disp: 90 tablet, Rfl: 0   pantoprazole (PROTONIX) 40 MG tablet, Take 1 tablet (40 mg total) by mouth daily., Disp: 30 tablet, Rfl: 0   thiamine 100 MG tablet, Take 1 tablet (100 mg total) by mouth daily., Disp: 30 tablet, Rfl: 0  Exam: Current vital signs: BP (!) 149/90 (BP Location: Right Arm)   Pulse 75   Resp 17   Wt 56.6 kg   LMP 01/22/1992 (Within Months)   SpO2 100%   BMI 22.10 kg/m  Vital signs in last 24 hours: Pulse  Rate:  [75-78] 75 (07/31 1341) Resp:  [16-17] 17 (07/31 1341) BP: (128-149)/(83-90) 149/90 (07/31 1341) SpO2:  [99 %-100 %] 100 % (07/31 1341) Weight:  [56.6 kg] 56.6 kg (07/31 1300)  GENERAL: Awake, alert, pleasantly confused Psych: Patient is cooperative with assessment and calm, she does appear confused Head: Normocephalic and atraumatic, without obvious abnormality EENT: Normal conjunctivae, dry mucous membranes, no OP obstruction LUNGS: Normal respiratory effort. Non-labored breathing on room air CV: Regular rate and rhythm on telemetry ABDOMEN: Soft, non-tender, non-distended Extremities: Warm, well perfused, without obvious deformity  NEURO:  Mental Status: Awake and alert. Does not answer orientation questions.  She is aphasic with expressive > receptive aphasia.  She does follow some commands and mimic but at times is unable to follow commands.  She is unable to provide a clear and coherent history of present illness. She is unable to name  objects but can repeat phrases and mimic examiner.  No neglect is noted Cranial Nerves:  II: PERRL 3 mm/brisk. Visual fields full.  III, IV, VI: EOMI without ptosis, nystagmus, or gaze preference.  V: Sensation is intact to light touch and symmetrical to face. Blinks to threat.  VII: Face is symmetric resting and with movement  VIII: Hearing is intact to voice IX, X: Palate elevation is symmetric. Phonation normal.  XI: Normal sternocleidomastoid and trapezius muscle strength XII: Tongue protrudes midline without fasciculations.   Motor: 5/5 strength is all muscle groups without vertical drift.  Tone is normal. Bulk is normal.  Sensation: Intact to light touch bilaterally in all four extremities. No extinction to DSS present.  Coordination: No obvious dysmetria on exam though patient has difficulty following complex commands.  DTRs: 2+ and symmetric throughout.  Gait: Deferred  NIHSS: 1a Level of Conscious.: 0 1b LOC Questions:  2 1c LOC Commands: 1 2 Best Gaze: 0 3 Visual: 0 4 Facial Palsy: 0 5a Motor Arm - left: 0 5b Motor Arm - Right: 0 6a Motor Leg - Left: 0 6b Motor Leg - Right: 0 7 Limb Ataxia: 0 8 Sensory: 0 9 Best Language: 2 10 Dysarthria: 0 11 Extinct. and Inatten.: 0 TOTAL: 5  Labs I have reviewed labs in epic and the results pertinent to this consultation are: CBC    Component Value Date/Time   WBC 10.2 08/20/2021 1320   RBC 4.38 08/20/2021 1320   HGB 13.9 08/20/2021 1321   HCT 41.0 08/20/2021 1321   PLT 348 08/20/2021 1320   MCV 90.0 08/20/2021 1320   MCH 31.1 08/20/2021 1320   MCHC 34.5 08/20/2021 1320   RDW 13.1 08/20/2021 1320   LYMPHSABS 2.8 08/20/2021 1320   MONOABS 1.8 (H) 08/20/2021 1320   EOSABS 0.1 08/20/2021 1320   BASOSABS 0.0 08/20/2021 1320   CMP     Component Value Date/Time   NA 138 08/20/2021 1321   K 3.8 08/20/2021 1321   CL 106 08/20/2021 1321   CO2 23 07/18/2021 0541   GLUCOSE 98 08/20/2021 1321   BUN 7 (L) 08/20/2021 1321   CREATININE 0.60 08/20/2021 1321   CREATININE 0.71 03/03/2020 1453   CALCIUM 8.7 (L) 07/18/2021 0541   PROT 5.7 (L) 07/17/2021 0630   ALBUMIN 2.7 (L) 07/17/2021 0630   AST 23 07/17/2021 0630   ALT 21 07/17/2021 0630   ALKPHOS 52 07/17/2021 0630   BILITOT 0.7 07/17/2021 0630   GFRNONAA >60 07/18/2021 0541   GFRAA >60 10/02/2019 0713   Lipid Panel     Component Value Date/Time   CHOL 161 01/01/2018 1044   TRIG 197.0 (H) 01/01/2018 1044   HDL 47.70 01/01/2018 1044   CHOLHDL 3 01/01/2018 1044   VLDL 39.4 01/01/2018 1044   LDLCALC 74 01/01/2018 1044   Lab Results  Component Value Date   HGBA1C 4.1 03/03/2020    Assessment: 81 y.o. female with PMHx of HTN, recent PFO closure with resolution of chronic hypoxic respiratory failure, breast cancer s/p lumpectomy, migraine headaches, and dementia without behavioral disturbance who presented to the ED 7/31 for evaluation of acute onset of aphasia.  - Examination reveals patient  with expressive > receptive aphasia with an NIHSS of 5.  - CT imaging reviewed without active hemorrhage, therefore, TNK was administered. Vessel imaging is without LVO- patient not a candidate for thrombectomy.  - Presentation most consistent with acute ischemic stroke and patient was given TNK at 13:31. Will obtain MRI imaging  and admit for further stroke work up.  - Stroke risk factors include advanced age, family history of stoke, and hypertension.   Impression: Acute ischemic stroke s/p TNK administration   Plan  # Expressive>receptive aphasia favored to be 2/2 acute ischemic stroke, now s/p TNK - Admit to ICU under Dr. Quinn Axe - Neurochecks and NIHSS documentation per post-tNK protocol - STAT head CT for any change in neurologic exam - Non-con head CT 24 hrs post-tNK r/o hemorrhagic conversion - MRI brain wo contrast - TTE - no aspirin for 24 hours post IV t-PA and until ICH ruled out by a head CT - keep SBP less than 180/105 for the 1st 24 hours post IV t-NK to reduce the risk of hemorrhagic transformation - SCDs for DVT prophylaxis; can start SQ Heparin if head CT 24 hours post IV tPA is negative for ICH - NPO; swallow study - Strict bedrest x24 hrs - PT/OT/SLP when able to participate - PT/OT and speech therapy - stroke education - outpatient f/u with neurology after discharge  RESP -Maintain SpO2 > 94% -Supplemental oxygen as needed  CV Essential (primary) hypertension - Maintain BP <180/105 post-TNK, clevidipine gtt for BP above these parameters  HEME -Monitor for bleeding s/p TNK  ENDO A1c pending -goal HgbA1c < 7%  GI/GU -Gentle hydration -UDS/UA pending   Prophylaxis DVT:  SCDs GI: PPI Bowel: Docusate / Senna  Diet: NPO until cleared by speech  Code Status: Full Code  THE FOLLOWING WERE PRESENT ON ADMISSION: CNS -  Acute Ischemic Stroke  Anibal Henderson, AGACNP-BC [facility] [phone]   Neurology Attending Attestation   I  examined the patient and discussed plan with Ms. Toberman NP. Above note has been edited by me to reflect my findings and recommendations. I was present throughout the stroke code and made all significant decisions and personally reviewed CNS imaging and also discussed CTA results with radiologist by phone.   This patient is critically ill and at significant risk of neurological worsening, death and care requires constant monitoring of vital signs, hemodynamics,respiratory and cardiac monitoring, neurological assessment, discussion with family, other specialists and medical decision making of high complexity. I spent 110 minutes of neurocritical care time  in the care of  this patient. This was time spent independent of any time provided by nurse practitioner or PA.  I personally discussed the risks and benefits of TNK with daughter Monique Ballard by phone.  Also confirmed that she met inclusion criteria and did not meet any exclusion criteria.  Based on this discussion daughter gave informed consent to proceed with TNK.  Patient once unable to consent herself secondary to aphasia.  All CNS imaging personally reviewed. Head CT NAICP with infarcts in left parietal cortex, right basal ganglia, and right cerebellar hemisphere.  CTA showed no LVO or hemodynamically significant stenosis in the anterior circulation.  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  CTA head showed an apparent focus of ill-defined hypoattenuation within the mid basilar.  Dr. Armandina Gemma of neuroradiology felt the differential diagnosis is fenestration or moderate atherosclerotic stenosis versus small subocclusive thrombus.  MRA of the head has been ordered for further evaluation.  Her symptoms would localize to the left MCA not the basilar.  Patient admitted to neuro-ICU for post-TNK monitoring and stroke  workup (above).  Su Monks, MD [facility] [phone]  If 7pm-  7am, please page neurology on call as listed in [place].

## 2021-08-20 NOTE — ED Notes (Addendum)
Patient transported to MRI 

## 2021-08-20 NOTE — ED Triage Notes (Signed)
Pt coming from home as a code stroke via [facility]. Pt was in the middle of eating breakfast at about 945 when family noticed she was having trouble getting her words out. Pt has hx of dementia, not on blood thinners.  BP 140/90 98% room air  CBG 113

## 2021-08-20 NOTE — Progress Notes (Signed)
PHARMACIST CODE STROKE RESPONSE  Notified to mix TNK at 1324 by Dr. Quinn Axe TNK preparation completed at 1326  TNK dose = 14 mg IV over 5 seconds.   Issues/delays encountered (if applicable):   Bertis Ruddy 08/20/21 1:30 PM

## 2021-08-20 NOTE — Code Documentation (Signed)
Stroke Response Nurse Documentation Code Documentation  Monique Ballard is a 81 y.o. female arriving to [facility]  via [facility] on 08/20/21 with past medical hx of PFO closure in June, hypertension, dementia without behavioral disturbance, breast cancer s/p left lumpectomy, migraine headaches, frequent falls, alcohol dependence. On aspirin 81 mg daily. Code stroke was activated by EMS.   Patient from home where she was LKW at [place] and now complaining of confusion and speech difficulty. Pt has a home care aid and was eating breakfast when she developed sudden onset of aphasia and confusion. Not following commands. Family says she has had this briefly in the past and thought it would resolve. Eventually they called 911 when she was not improving.  Stroke team at the bedside on patient arrival. Labs drawn and patient cleared for CT by Dr. Langston Masker. Patient to CT with team. NIHSS 4, see documentation for details and code stroke times. Patient with disoriented and Expressive aphasia  on exam. The following imaging was completed:  CT Head and CTA. Patient is a candidate for IV Thrombolytic due to reviewed exclusion criteria. Patient is not a candidate for IR due to no LVO per MD. TNK given at 1331. BP 128/83, CBG WNL.   Care Plan: Q15x2h, Q30x6h, Q1x16h NIHSS/vitals, BP<180/105, MRI, admission.   Bedside handoff with ED RN [facility].    Monique Ballard, Monique Ballard  Stroke Response RN

## 2021-08-20 NOTE — Telephone Encounter (Signed)
I just saw the message from the patient's daughter, she is having expressive aphasia. Fortunately shortly after she sent me the message she decided to take her to the emergency room.

## 2021-08-21 ENCOUNTER — Other Ambulatory Visit (HOSPITAL_COMMUNITY): Payer: Self-pay

## 2021-08-21 ENCOUNTER — Inpatient Hospital Stay (HOSPITAL_COMMUNITY): Payer: Medicare Other

## 2021-08-21 ENCOUNTER — Encounter (HOSPITAL_COMMUNITY)
Admission: EM | Disposition: A | Discharge status: Home / Self Care | Payer: Self-pay | Source: Home / Self Care | Attending: Neurology

## 2021-08-21 ENCOUNTER — Other Ambulatory Visit (HOSPITAL_COMMUNITY): Payer: Medicare Other

## 2021-08-21 DIAGNOSIS — I639 Cerebral infarction, unspecified: Secondary | ICD-10-CM | POA: Diagnosis not present

## 2021-08-21 LAB — COMPREHENSIVE METABOLIC PANEL
ALT: 13 U/L (ref 0–44)
AST: 20 U/L (ref 15–41)
Albumin: 3.7 g/dL (ref 3.5–5.0)
Alkaline Phosphatase: 63 U/L (ref 38–126)
Anion gap: 7 (ref 5–15)
BUN: 5 mg/dL — ABNORMAL LOW (ref 8–23)
CO2: 21 mmol/L — ABNORMAL LOW (ref 22–32)
Calcium: 9.2 mg/dL (ref 8.9–10.3)
Chloride: 107 mmol/L (ref 98–111)
Creatinine, Ser: 0.69 mg/dL (ref 0.44–1.00)
GFR, Estimated: >60 mL/min (ref >60)
Glucose, Bld: 92 mg/dL (ref 70–99)
Potassium: 3.7 mmol/L (ref 3.5–5.1)
Sodium: 135 mmol/L (ref 135–145)
Total Bilirubin: 0.9 mg/dL (ref 0.3–1.2)
Total Protein: 7.1 g/dL (ref 6.5–8.1)

## 2021-08-21 LAB — LIPID PANEL
Cholesterol: 168 mg/dL (ref 0–200)
HDL: 46 mg/dL (ref >40)
LDL Cholesterol: 100 mg/dL — ABNORMAL HIGH (ref 0–99)
Total CHOL/HDL Ratio: 3.7 RATIO
Triglycerides: 112 mg/dL (ref <150)
VLDL: 22 mg/dL (ref 0–40)

## 2021-08-21 LAB — CBC WITH DIFFERENTIAL/PLATELET
Abs Immature Granulocytes: 0.04 10*3/uL (ref 0.00–0.07)
Basophils Absolute: 0.1 10*3/uL (ref 0.0–0.1)
Basophils Relative: 1 %
Eosinophils Absolute: 0.1 10*3/uL (ref 0.0–0.5)
Eosinophils Relative: 1 %
HCT: 42.1 % (ref 36.0–46.0)
Hemoglobin: 14.3 g/dL (ref 12.0–15.0)
Immature Granulocytes: 0 %
Lymphocytes Relative: 29 %
Lymphs Abs: 2.9 10*3/uL (ref 0.7–4.0)
MCH: 30.8 pg (ref 26.0–34.0)
MCHC: 34 g/dL (ref 30.0–36.0)
MCV: 90.7 fL (ref 80.0–100.0)
Monocytes Absolute: 1.7 10*3/uL — ABNORMAL HIGH (ref 0.1–1.0)
Monocytes Relative: 18 %
Neutro Abs: 5 10*3/uL (ref 1.7–7.7)
Neutrophils Relative %: 51 %
Platelets: 324 10*3/uL (ref 150–400)
RBC: 4.64 MIL/uL (ref 3.87–5.11)
RDW: 13.2 % (ref 11.5–15.5)
WBC: 9.7 10*3/uL (ref 4.0–10.5)
nRBC: 0 % (ref 0.0–0.2)

## 2021-08-21 LAB — GLUCOSE, CAPILLARY
Glucose-Capillary: 77 mg/dL (ref 70–99)
Glucose-Capillary: 90 mg/dL (ref 70–99)
Glucose-Capillary: 87 mg/dL (ref 70–99)
Glucose-Capillary: 150 mg/dL — ABNORMAL HIGH (ref 70–99)

## 2021-08-21 SURGERY — ECHOCARDIOGRAM, TRANSESOPHAGEAL
Anesthesia: Moderate Sedation

## 2021-08-21 MED ORDER — ROSUVASTATIN CALCIUM 20 MG PO TABS
20.0000 mg | ORAL_TABLET | Freq: Every day | ORAL | Status: DC
  Administered 2021-08-21 – 2021-08-22 (×2): 20 mg via ORAL
  Filled 2021-08-21 (×2): qty 1

## 2021-08-21 MED ORDER — ASPIRIN 81 MG PO TBEC
81.0000 mg | DELAYED_RELEASE_TABLET | Freq: Every day | ORAL | Status: DC
Start: 2021-08-21 — End: 2021-08-22
  Administered 2021-08-21 – 2021-08-22 (×2): 81 mg via ORAL
  Filled 2021-08-21 (×2): qty 1

## 2021-08-21 MED ORDER — ORAL CARE MOUTH RINSE: 15.0000 mL | OROMUCOSAL | Status: DC | PRN

## 2021-08-21 MED ORDER — APIXABAN 5 MG PO TABS
5.0000 mg | ORAL_TABLET | Freq: Two times a day (BID) | ORAL | Status: DC
  Administered 2021-08-21 – 2021-08-22 (×2): 5 mg via ORAL
  Filled 2021-08-21 (×2): qty 1

## 2021-08-21 MED ORDER — SODIUM CHLORIDE 0.9% FLUSH
10.0000 mL | Freq: Once | INTRAVENOUS | Status: AC
  Administered 2021-08-21: 10 mL via INTRAVENOUS

## 2021-08-21 MED ORDER — CHLORHEXIDINE GLUCONATE CLOTH 2 % EX PADS: 6.0000 | MEDICATED_PAD | Freq: Every day | CUTANEOUS | Status: DC

## 2021-08-21 MED ORDER — MUPIROCIN 2 % EX OINT
1.0000 | TOPICAL_OINTMENT | Freq: Two times a day (BID) | CUTANEOUS | Status: DC
  Administered 2021-08-21 – 2021-08-22 (×3): 1 via NASAL
  Filled 2021-08-21: qty 22

## 2021-08-21 NOTE — Progress Notes (Addendum)
STROKE TEAM PROGRESS NOTE   INTERVAL HISTORY Patient is seen in her room with her daughter at the bedside.  Yesterday, she experienced acute onset expressive aphasia.  She was brought to the hospital and given TNK to treat her stroke.  Her symptoms have improved.  Patient reportedly had a similar episode of expressive aphasia about a week ago that resolved spontaneously.  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 Vitals:   08/21/21 1200 08/21/21 1230 08/21/21 1300 08/21/21 1330  BP: (!) 134/101 (!) 153/99 (!) 156/108 (!) 143/104  Pulse: 86 81 89 89  Resp: _0 Temp:      TempSrc:      SpO2: 93% 93% 92% 92%  Weight:       CBC:  Recent Labs  Lab 08/20/21 1320 08/20/21 1321 08/21/21 1334  WBC 10.2  --  9.7  NEUTROABS 5.4  --  5.0  HGB 13.6 13.9 14.3  HCT 39.4 41.0 42.1  MCV 90.0  --  90.7  PLT 348  --  563   Basic Metabolic Panel:  Recent Labs  Lab 08/20/21 1320 08/20/21 1321 08/21/21 1334  NA 136 138 135  K 3.8 3.8 3.7  CL 108 106 107  CO2 19*  --  21*  GLUCOSE 98 98 92  BUN 8 7* 5*  CREATININE 0.71 0.60 0.69  CALCIUM 9.2  --  9.2   Lipid Panel:  Recent Labs  Lab 08/21/21 1334  CHOL 168  TRIG 112  HDL 46  CHOLHDL 3.7  VLDL 22  LDLCALC 100*   HgbA1c:  Recent Labs  Lab 08/20/21 1326  HGBA1C 4.7*   Urine Drug Screen:  Recent Labs  Lab 08/20/21 1420  LABOPIA NONE DETECTED  COCAINSCRNUR NONE DETECTED  LABBENZ NONE DETECTED  AMPHETMU NONE DETECTED  THCU NONE DETECTED  LABBARB NONE DETECTED    Alcohol Level  Recent Labs  Lab 08/20/21 1316  ETH <10    IMAGING past 24 hours MR ANGIO HEAD WO CONTRAST  Result Date: 08/20/2021 CLINICAL DATA:  Acute neurologic deficit EXAM: MRI HEAD WITHOUT CONTRAST MRA HEAD WITHOUT  CONTRAST TECHNIQUE: Multiplanar, multi-echo pulse sequences of the brain and surrounding structures were acquired without intravenous contrast. Angiographic images of the Circle of Willis were acquired using MRA technique without intravenous contrast. COMPARISON:  None Available. FINDINGS: Examination is markedly degraded by motion. MRI HEAD FINDINGS Brain: No acute infarct, mass effect or extra-axial collection. No acute or chronic hemorrhage. There is multifocal hyperintense T2-weighted signal within the white matter. Generalized volume loss. Old, punctate small vessel infarct of the left cerebellum. The midline structures are normal. Vascular: Major flow voids are preserved. Skull and upper cervical spine: Normal calvarium and skull base. Visualized upper cervical spine and soft tissues are normal. Sinuses/Orbits:No paranasal sinus fluid levels or advanced mucosal thickening. No mastoid or middle ear effusion. Normal orbits. MRA HEAD FINDINGS Images are severely motion degraded. Anterior circulation: There is no proximal occlusion of the anterior or middle cerebral arteries. There is some atherosclerotic irregularity of the internal carotid arteries at the skull base. Both posterior communicating arteries are patent. Posterior circulation: Diminutive distal basilar artery. Vertebral arteries are patent along the visualized course. Both posterior cerebral arteries are patent. IMPRESSION: 1. Severely  motion degraded examination. 2. No acute intracranial abnormality. 3. Chronic small vessel ischemic changes and volume loss. 4. No emergent large vessel occlusion. Electronically Signed   By: Ulyses Jarred M.D.   On: 08/20/2021 19:03   MR BRAIN WO CONTRAST  Result Date: 08/20/2021 CLINICAL DATA:  Acute neurologic deficit EXAM: MRI HEAD WITHOUT CONTRAST MRA HEAD WITHOUT CONTRAST TECHNIQUE: Multiplanar, multi-echo pulse sequences of the brain and surrounding structures were acquired without intravenous contrast.  Angiographic images of the Circle of Willis were acquired using MRA technique without intravenous contrast. COMPARISON:  None Available. FINDINGS: Examination is markedly degraded by motion. MRI HEAD FINDINGS Brain: No acute infarct, mass effect or extra-axial collection. No acute or chronic hemorrhage. There is multifocal hyperintense T2-weighted signal within the white matter. Generalized volume loss. Old, punctate small vessel infarct of the left cerebellum. The midline structures are normal. Vascular: Major flow voids are preserved. Skull and upper cervical spine: Normal calvarium and skull base. Visualized upper cervical spine and soft tissues are normal. Sinuses/Orbits:No paranasal sinus fluid levels or advanced mucosal thickening. No mastoid or middle ear effusion. Normal orbits. MRA HEAD FINDINGS Images are severely motion degraded. Anterior circulation: There is no proximal occlusion of the anterior or middle cerebral arteries. There is some atherosclerotic irregularity of the internal carotid arteries at the skull base. Both posterior communicating arteries are patent. Posterior circulation: Diminutive distal basilar artery. Vertebral arteries are patent along the visualized course. Both posterior cerebral arteries are patent. IMPRESSION: 1. Severely motion degraded examination. 2. No acute intracranial abnormality. 3. Chronic small vessel ischemic changes and volume loss. 4. No emergent large vessel occlusion. Electronically Signed   By: Ulyses Jarred M.D.   On: 08/20/2021 19:03    PHYSICAL EXAM General:  Alert, well-developed, well-nourished patient in no acute distress Respiratory: Regular, unlabored respirations on room air  NEURO:  Mental Status: AA&Ox3  Speech/Language: speech is without dysarthria or aphasia.  Naming and repetition impaired, fluency slightly impaired, comprehension intact.  Cranial Nerves:  II: PERRL. Visual fields full.  III, IV, VI: EOMI. Eyelids elevate symmetrically.   V: Sensation is intact to light touch and symmetrical to face.  VII: Smile is symmetrical.  VIII: hearing intact to voice. IX, X:Phonation is normal.  EP:PIRJJOAC shrug 5/5. XII: tongue is midline without fasciculations. Motor: 5/5 strength to all muscle groups tested.  Tone: is normal and bulk is normal Sensation- Intact to light touch bilaterally.   Coordination: FTN intact bilaterally.No drift.  Gait- deferred   ASSESSMENT/PLAN Monique Ballard Height is a 81 y.o. female with history of HTN, dementia, BRCA, migraines, alcohol dependence, anxiety and recent PFO closure to treat persistent hypoxia presenting with  acute onset expressive aphasia.  She was brought to the hospital and given TNK to treat her stroke.  Her symptoms have improved.  Patient reportedly had a similar episode of expressive aphasia about a week ago that resolved spontaneously.  She had a procedure to close a PFO in June of this year.  MRI reveals no acute abnormality, so stroke is likely to small to be seen.  Stroke:  left sided stroke too small to be seen on MRI versus TIA Etiology:  possibly embolic from recent PFO closure -device thrombosis versus transient paroxysmal A-fib Code Stroke CT head No acute abnormality. Small vessel disease.  CTA head & neck no LVO or hemodynamically significant stenosis, 63m psuedoaneurysm from left cervical ICA, focus on ill-defined hypoattenuation in mid basilar artery MRI  No acute abnormality, no thrombus seen  in basilar artery, motion degraded exam 2D Echo pending TEE deferred as patient has esophageal stricture and did not have one even before PFO closure for the same reason LDL 100 HgbA1c 4.7 VTE prophylaxis - fully anticoagulated with Eliquis    Diet   Diet Heart Room service appropriate? Yes; Fluid consistency: Thin   aspirin 81 mg daily prior to admission, now on aspirin 81 mg daily and Eliquis (apixaban) daily.  For presumed device thrombosis versus transient postprocedure  A-fib for 2 months followed by aspirin alone Therapy recommendations:  pending Disposition:  pending  Hypertension Home meds:  none Stable Keep BP <180/105 Long-term BP goal normotensive  Hyperlipidemia Home meds:  none LDL 100, goal < 70 Add rosuvastatin 20 mg daily Continue statin at discharge   Other Stroke Risk Factors Advanced Age >/= 80  Former cigarette smoker  Other Active Problems Recent PFO closure Unable to obtain TEE due to esophageal stricture and cardiac MRI unlikely to detect thrombus.   Will anticoagulate with Eliquis for two months in case of postoperative atrial fibrillation or device related thrombus  Hospital day # [place] , MSN, AGACNP-BC [facility] See Amion for schedule and pager information 08/21/2021 3:25 PM   STROKE MD NOTE :  I have personally obtained history,examined this patient, reviewed notes, independently viewed imaging studies, participated in medical decision making and plan of care.ROS completed by me personally and pertinent positives fully documented  I have made any additions or clarifications directly to the above note. Agree with note above.  Patient presented with transient episode of expressive aphasia which resolved after treatment with IV thrombolysis with TNK.  Patient did undergo recent PFO closure for platypnea orthodeoxia syndrome with excellent improvement in her hypoxia.  This could be device thrombus versus transient postprocedure A-fib which can occur.  Unfortunately patient cannot have a TEE to evaluate PFO closure device due to esophageal stricture hence after discussion with Dr. Einar Gip her cardiologist recommend transient anticoagulation with Eliquis for 2 months along with aspirin 81 mg with followed by aspirin alone.  Continue close neurological monitoring and strict blood pressure control as per postthrombolytic protocol continue ongoing stroke work-up.  Aggressive risk factor modification.   Mobilize out of bed.  Therapy consults.  Long discussion with patient and daughter at the bedside and answered questions.  Discussed with Dr. Einar Gip. This patient is critically ill and at significant risk of neurological worsening, death and care requires constant monitoring of vital signs, hemodynamics,respiratory and cardiac monitoring, extensive review of multiple databases, frequent neurological assessment, discussion with family, other specialists and medical decision making of high complexity.I have made any additions or clarifications directly to the above note.This critical care time does not reflect procedure time, or teaching time or supervisory time of PA/NP/Med Resident etc but could involve care discussion time.  I spent 30 minutes of neurocritical care time  in the care of  this patient.     Antony Contras, MD Medical Director [place] Pager: [phone] 08/21/2021 3:48 PM  To contact Stroke Continuity provider, please refer to [email or web address] After hours, contact General Neurology

## 2021-08-21 NOTE — Progress Notes (Signed)
  Echocardiogram 2D Echocardiogram has been performed. Dr. Terri Skains paged to read at 2:55 pm.   Monique Ballard 08/21/2021, 2:55 PM

## 2021-08-21 NOTE — Evaluation (Signed)
OT Cancellation Note  Patient Details Name: Monique Ballard MRN: [id number] DOB: October 28, 1940   Cancelled Treatment:    Reason Eval/Treat Not Completed: Active bedrest order (Will return as schedule allows.)  [place], OTR/L Acute Rehab Office: [phone] 08/21/2021, 7:45 AM

## 2021-08-21 NOTE — Evaluation (Signed)
Occupational Therapy Evaluation Patient Details Name: Monique Ballard MRN: [id number] DOB: 1940-05-14 Today's Date: 08/21/2021   History of Present Illness Patient is a 81 y/o female who presents on 7/31 with confusion and speech difficulties. NIH:4. s/p TNK. Head CT and Brain MRI- unremarkable. PMH includes breast ca, alcohol dependency, depression/anxiety, PFO closure in June, HTN, dementia without behavioral disturbance.   Clinical Impression   PTA, pt was living with her son and her husband; was requiring assistance for ADLs and transfers to/from wheelchair since recent dc from CIR (after fall). Pt performing at Mod-Max A for LB ADLs and Min-Mod A for functional mobility with RW. Pt difficulty maintain TDWB but daughter reports this is similar to baseline performance. Pt would benefit from further acute OT to facilitate safe dc. Recommend dc to home once medically stable per physician.      Recommendations for follow up therapy are one component of a multi-disciplinary discharge planning process, led by the attending physician.  Recommendations may be updated based on patient status, additional functional criteria and insurance authorization.   Follow Up Recommendations  No OT follow up    Assistance Recommended at Discharge Frequent or constant Supervision/Assistance  Patient can return home with the following      Functional Status Assessment  Patient has had a recent decline in their functional status and demonstrates the ability to make significant improvements in function in a reasonable and predictable amount of time.  Equipment Recommendations  None recommended by OT    Recommendations for Other Services       Precautions / Restrictions Precautions Precautions: Fall Required Braces or Orthoses: Other Brace Other Brace: CAM boot Restrictions Weight Bearing Restrictions: Yes RLE Weight Bearing: Touchdown weight bearing      Mobility Bed Mobility Overal bed mobility:  Needs Assistance Bed Mobility: Supine to Sit     Supine to sit: Supervision, HOB elevated     General bed mobility comments: No assist needed, use of rail    Transfers Overall transfer level: Needs assistance Equipment used: Rolling walker (2 wheels) Transfers: Sit to/from Stand, Bed to chair/wheelchair/BSC Sit to Stand: Min assist, Mod assist Stand pivot transfers: Min assist   Step pivot transfers: Min assist, Mod assist     General transfer comment: Min A to power to standing from EOB x2, from chair x2, Mod A from toilet x1 (lower surface), SPT bed to chair and step pivot toilet to chair, impaired balance with posterior bias and LOB x2. Manual cues for hand placement/technique. Difficulty maintaining TDWB RLE despite cues.      Balance Overall balance assessment: Needs assistance Sitting-balance support: Feet supported, No upper extremity supported Sitting balance-Leahy Scale: Good     Standing balance support: During functional activity, Reliant on assistive device for balance, Bilateral upper extremity supported Standing balance-Leahy Scale: Poor Standing balance comment: external support for standing                           ADL either performed or assessed with clinical judgement   ADL Overall ADL's : Needs assistance/impaired Eating/Feeding: Set up;Sitting   Grooming: Wash/dry face;Supervision/safety;Set up;Sitting   Upper Body Bathing: Minimal assistance;Sitting   Lower Body Bathing: Moderate assistance;Maximal assistance;Sit to/from stand   Upper Body Dressing : Supervision/safety;Set up;Sitting   Lower Body Dressing: Maximal assistance;Sit to/from stand   Toilet Transfer: Minimal assistance;Ambulation;Regular Toilet;Rolling walker (2 wheels) Toilet Transfer Details (indicate cue type and reason): Urgency for BM; walking to bathroom  with Min-Mod A. Unable to maintain TDWB - daughter reports this is what she has been doing at home Toileting-  Water quality scientist and Hygiene: Moderate assistance;Sit to/from stand;Sitting/lateral lean Toileting - Clothing Manipulation Details (indicate cue type and reason): assistance for posterior peri care. While seated pt able to perform anterior peri care     Functional mobility during ADLs: Minimal assistance;Rolling walker (2 wheels);Moderate assistance General ADL Comments: Pt near baseline - since dc to home from [place] Vision/History: 1 Wears glasses Patient Visual Report: No change from baseline       Perception     Praxis      Pertinent Vitals/Pain Pain Assessment Pain Assessment: No/denies pain     Hand Dominance Right   Extremity/Trunk Assessment Upper Extremity Assessment Upper Extremity Assessment: LUE deficits/detail LUE Deficits / Details: Slightly weaker; but also non-dominant. Able to perform opposition and ROM   Lower Extremity Assessment Lower Extremity Assessment: Defer to PT evaluation RLE Deficits / Details: In CAM boot RLE Sensation: WNL LLE Deficits / Details: Foot drop noted, trace activation LLE Sensation: WNL   Cervical / Trunk Assessment Cervical / Trunk Assessment: Kyphotic   Communication Communication Communication: No difficulties   Cognition Arousal/Alertness: Awake/alert Behavior During Therapy: WFL for tasks assessed/performed Overall Cognitive Status: History of cognitive impairments - at baseline                                 General Comments: Baseline dementia     General Comments  Daughter present throughout session    Exercises     Shoulder Instructions      Home Living Family/patient expects to be discharged to:: Private residence Living Arrangements: Spouse/significant other;Children (son) Available Help at Discharge: Available 24 hours/day;Family;Personal care attendant Type of Home: House Home Access: Stairs to enter Technical brewer of Steps: 2 Entrance Stairs-Rails:  Left;Right;Can reach both Home Layout: One level     Bathroom Shower/Tub: Walk-in shower;Tub/shower unit   Bathroom Toilet: Standard Bathroom Accessibility: Yes   Home Equipment: Shower seat - built in;Grab bars - Academic librarian (2 wheels);Wheelchair - manual;Tub bench   Additional Comments: Recent dc home from CIR. HH PT/OT  Lives With: Spouse    Prior Functioning/Environment Prior Level of Function : Needs assist             Mobility Comments: RW and assistance for transfers to w/c, per daughter pt has been mostly transferring and not walking ADLs Comments: aid (9-5 monday-thursday) who comes to assist with ADLs.        OT Problem List: Decreased range of motion;Decreased activity tolerance;Impaired balance (sitting and/or standing);Decreased knowledge of use of DME or AE;Decreased knowledge of precautions;Decreased strength      OT Treatment/Interventions: Self-care/ADL training;Therapeutic exercise;Energy conservation;DME and/or AE instruction;Therapeutic activities;Patient/family education    OT Goals(Current goals can be found in the care plan section) Acute Rehab OT Goals Patient Stated Goal: Go home OT Goal Formulation: With patient Time For Goal Achievement: 09/04/21 Potential to Achieve Goals: Good  OT Frequency: Min 3X/week    Co-evaluation PT/OT/SLP Co-Evaluation/Treatment: Yes Reason for Co-Treatment: For patient/therapist safety;To address functional/ADL transfers PT goals addressed during session: Mobility/safety with mobility OT goals addressed during session: ADL's and self-care      AM-PAC OT "6 Clicks" Daily Activity     Outcome Measure Help from another person eating meals?: None Help from another person taking care of personal grooming?:  A Little Help from another person toileting, which includes using toliet, bedpan, or urinal?: A Lot Help from another person bathing (including washing, rinsing, drying)?: A Lot Help from another person  to put on and taking off regular upper body clothing?: A Little Help from another person to put on and taking off regular lower body clothing?: A Lot 6 Click Score: 16   End of Session Equipment Utilized During Treatment: Rolling walker (2 wheels);Gait belt Nurse Communication: Mobility status  Activity Tolerance: Patient tolerated treatment well Patient left: in chair;with call bell/phone within reach;with chair alarm set;with family/visitor present  OT Visit Diagnosis: Other abnormalities of gait and mobility (R26.89);Unsteadiness on feet (R26.81);Muscle weakness (generalized) (M62.81)                Time: 5701-7793 OT Time Calculation (min): 35 min Charges:  OT General Charges $OT Visit: 1 Visit OT Evaluation $OT Eval Moderate Complexity: 1 Mod  Zeola Brys MSOT, OTR/L Acute Rehab Office: [place] 08/21/2021, 5:17 PM

## 2021-08-21 NOTE — Progress Notes (Signed)
ANTICOAGULATION CONSULT NOTE - Initial Consult  Pharmacy Consult for Apixaban Indication: clot on PFO closure device  Allergies  Allergen Reactions   Penicillins Rash    Has patient had a PCN reaction causing immediate rash, facial/tongue/throat swelling, SOB or lightheadedness with hypotension: Yes Has patient had a PCN reaction causing severe rash involving mucus membranes or skin necrosis: No Has patient had a PCN reaction that required hospitalization No Has patient had a PCN reaction occurring within the last 10 years: No If all of the above answers are "NO", then may proceed with Cephalosporin use.     Patient Measurements: Weight: 56.6 kg (124 lb 12.5 oz)  Vital Signs: Temp: 98.3 F (36.8 C) (08/01 1151) Temp Source: Oral (08/01 1151) BP: 143/104 (08/01 1330) Pulse Rate: 89 (08/01 1330)  Labs: Recent Labs    08/20/21 1320 08/20/21 1321 08/21/21 1334  HGB 13.6 13.9 14.3  HCT 39.4 41.0 42.1  PLT 348  --  324  APTT 40*  --   --   LABPROT 14.9  --   --   INR 1.2  --   --   CREATININE 0.71 0.60 0.69    Estimated Creatinine Clearance: 47.9 mL/min (by C-G formula based on SCr of 0.69 mg/dL).   Medical History: Past Medical History:  Diagnosis Date   Anxiety    Breast cancer ([place]) fall 1997    Left Lumpectomy, XRT.  treated with Tamoxifem and Evista   Depression    Endometriosis in her 20's   surgery to help clear endometriosis to obtain a pregnancy.   GERD (gastroesophageal reflux disease)    w/"stretching" remotely   HTN (hypertension)    Migraines     Medications:  Medications Prior to Admission  Medication Sig Dispense Refill Last Dose   aspirin EC 81 MG tablet Take 1 tablet (81 mg total) by mouth daily. Swallow whole. 30 tablet 11 08/20/2021   escitalopram (LEXAPRO) 20 MG tablet Take 1 tablet (20 mg total) by mouth daily. 30 tablet 0 2/53/6644   folic acid (FOLVITE) 1 MG tablet Take 1 tablet (1 mg total) by mouth daily. 30 tablet 0 08/20/2021    metoprolol tartrate (LOPRESSOR) 25 MG tablet Take 0.5 tablets (12.5 mg total) by mouth 2 (two) times daily. 30 tablet 0 08/20/2021 at 10:15am   Multiple Vitamin (MULTIVITAMIN WITH MINERALS) TABS tablet Take 1 tablet by mouth daily.   08/20/2021   Nystatin (GERHARDT'S BUTT CREAM) CREA Apply 1 Application topically 3 (three) times daily.   08/20/2021   pantoprazole (PROTONIX) 40 MG tablet Take 1 tablet (40 mg total) by mouth daily. 30 tablet 0 08/20/2021   thiamine 100 MG tablet Take 1 tablet (100 mg total) by mouth daily. 30 tablet 0 08/20/2021   OLANZapine (ZYPREXA) 7.5 MG tablet TAKE ONE TABLET BY MOUTH DAILY AT BEDTIME (Patient not taking: Reported on 08/10/2021) 90 tablet 0     Assessment: 81 y.o. presented as Code Stroke - s/p TNKase '14mg'$  7/31 ~1330. Noted pt with PFO closure by cards on 07/12/21. Echo shows suspected clot on PFO closure device. To start apixaban for clot. Neurology okayed starting tonight at '5mg'$  po BID.  Goal of Therapy:  Monitor platelets by anticoagulation protocol: Yes   Plan:  Apixaban '5mg'$  po BID Will f/u further head imaging and CBC **According to patient pharmacy advocate specialist, pt does not have any prescription assistance coverage. Pt will be able to get first month free but case management to see if pt can qualify for  patient assistance.Sherlon Handing, PharmD, BCPS Please see amion for complete clinical pharmacist phone list 08/21/2021,2:35 PM

## 2021-08-21 NOTE — Progress Notes (Signed)
PT Cancellation Note  Patient Details Name: Meeah Totino MRN: [id number] DOB: 02/24/1940   Cancelled Treatment:    Reason Eval/Treat Not Completed: Active bedrest order Pt on strict bedrest. Will await increase in activity orders prior to PT evaluation. Will follow.   Marguarite Arbour A Kendle Erker 08/21/2021, 7:02 AM Marisa Severin, PT, DPT Acute Rehabilitation Services Secure chat preferred Office [phone]

## 2021-08-21 NOTE — Evaluation (Signed)
Physical Therapy Evaluation Patient Details Name: Monique Ballard MRN: [id number] DOB: 1940/07/17 Today's Date: 08/21/2021  History of Present Illness  Patient is a 81 y/o female who presents on 7/31 with confusion and speech difficulties. NIH:4. s/p TNK. Head CT and Brain MRI- unremarkable. PMH includes breast ca, alcohol dependency, depression/anxiety, PFO closure in June, HTN, dementia without behavioral disturbance.  Clinical Impression  Patient presents with deconditioning, impaired balance and impaired mobility s/p above. Pt lives at home with her spouse and son, recently d/ced from [place] for a right ankle fx. She has an aide that comes Mon-Thursday to assist with ADLs. Pt also getting HHPT/OT. Pt reports minimal ambulation due to TDWB status and RLE in CAM boot and needs assist to transfer at baseline. Difficulty maintaining WB status even at home and during today's session. Supposed to have a follow up with the orthopedic MD this Friday. Today, pt tolerated multiple standing bouts and transfers with Min A as well as walking to bathroom with Min-Mod A for balance/support and use of RW. Per daughter, pt seems to be back to baseline with regards to mobility, cognition and speech. Will follow acutely to maximize independence and mobility and prevent further deconditioning while in the hospital.      Recommendations for follow up therapy are one component of a multi-disciplinary discharge planning process, led by the attending physician.  Recommendations may be updated based on patient status, additional functional criteria and insurance authorization.  Follow Up Recommendations Outpatient PT (when WB status is increased)      Assistance Recommended at Discharge Frequent or constant Supervision/Assistance  Patient can return home with the following  A little help with walking and/or transfers;A little help with bathing/dressing/bathroom;Help with stairs or ramp for entrance;Assist for  transportation;Assistance with cooking/housework;Direct supervision/assist for medications management    Equipment Recommendations None recommended by PT  Recommendations for Other Services       Functional Status Assessment Patient has not had a recent decline in their functional status     Precautions / Restrictions Precautions Precautions: Fall Required Braces or Orthoses: Other Brace Other Brace: CAM boot Restrictions Weight Bearing Restrictions: Yes RLE Weight Bearing: Touchdown weight bearing      Mobility  Bed Mobility Overal bed mobility: Needs Assistance Bed Mobility: Supine to Sit     Supine to sit: Supervision, HOB elevated     General bed mobility comments: No assist needed, use of rail    Transfers Overall transfer level: Needs assistance Equipment used: Rolling walker (2 wheels) Transfers: Sit to/from Stand, Bed to chair/wheelchair/BSC Sit to Stand: Min assist, Mod assist Stand pivot transfers: Min assist Step pivot transfers: Min assist, Mod assist       General transfer comment: Min A to power to standing from EOB x2, from chair x2, Mod A from toilet x1 (lower surface), SPT bed to chair and step pivot toilet to chair, impaired balance with posterior bias and LOB x2. Manual cues for hand placement/technique. Difficulty maintaining TDWB RLE despite cues.    Ambulation/Gait Ambulation/Gait assistance: Min assist Gait Distance (Feet): 8 Feet Assistive device: Rolling walker (2 wheels) Gait Pattern/deviations: Step-through pattern, Decreased dorsiflexion - left, Trunk flexed Gait velocity: decreased Gait velocity interpretation: <1.31 ft/sec, indicative of household ambulator   General Gait Details: Urgent gait to get to bathroom with decreased DF LLE and Min A for balance, with assist for RW management in tight space of bathroom, posterior bias and LOB with turning. HR up to 128 bpm with activity.  Stairs            Wheelchair Mobility     Modified Rankin (Stroke Patients Only)       Balance Overall balance assessment: Needs assistance Sitting-balance support: Feet supported, No upper extremity supported Sitting balance-Leahy Scale: Good     Standing balance support: During functional activity, Reliant on assistive device for balance, Bilateral upper extremity supported Standing balance-Leahy Scale: Poor Standing balance comment: external support for standing                             Pertinent Vitals/Pain Pain Assessment Pain Assessment: No/denies pain    Home Living Family/patient expects to be discharged to:: Private residence Living Arrangements: Spouse/significant other;Children (son) Available Help at Discharge: Available 24 hours/day;Family;Personal care attendant Type of Home: House Home Access: Stairs to enter Entrance Stairs-Rails: Left;Right;Can reach both Technical brewer of Steps: 2   Home Layout: One level Home Equipment: Shower seat - built in;Grab bars - toilet;Rolling Environmental consultant (2 wheels);Wheelchair - manual Additional Comments: Recent dc home from [facility]. HH PT/OT    Prior Function Prior Level of Function : Needs assist             Mobility Comments: RW and assistance for transfers to w/c, per daughter pt has been mostly transferring and not walking ADLs Comments: aid (9-5 monday-thursday) who comes to assist with ADLs.     Hand Dominance   Dominant Hand: Right    Extremity/Trunk Assessment   Upper Extremity Assessment Upper Extremity Assessment: Defer to OT evaluation LUE Deficits / Details: Slightly weaker; but also not dominant    Lower Extremity Assessment Lower Extremity Assessment: RLE deficits/detail;LLE deficits/detail RLE Deficits / Details: In CAM boot RLE Sensation: WNL LLE Deficits / Details: Foot drop noted, trace activation LLE Sensation: WNL    Cervical / Trunk Assessment Cervical / Trunk Assessment: Kyphotic  Communication    Communication: No difficulties  Cognition Arousal/Alertness: Awake/alert Behavior During Therapy: WFL for tasks assessed/performed Overall Cognitive Status: History of cognitive impairments - at baseline                                 General Comments: Baseline dementia        General Comments General comments (skin integrity, edema, etc.): Daughter present during session    Exercises     Assessment/Plan    PT Assessment Patient needs continued PT services  PT Problem List Decreased mobility;Decreased balance;Decreased range of motion;Decreased cognition;Decreased activity tolerance;Pain;Decreased safety awareness       PT Treatment Interventions Therapeutic activities;Gait training;Balance training;Stair training;Therapeutic exercise;Patient/family education;Wheelchair mobility training;Functional mobility training    PT Goals (Current goals can be found in the Care Plan section)  Acute Rehab PT Goals Patient Stated Goal: to go home PT Goal Formulation: With patient/family Time For Goal Achievement: 09/04/21 Potential to Achieve Goals: Good    Frequency Min 3X/week     Co-evaluation PT/OT/SLP Co-Evaluation/Treatment: Yes Reason for Co-Treatment: For patient/therapist safety;To address functional/ADL transfers PT goals addressed during session: Mobility/safety with mobility OT goals addressed during session: ADL's and self-care       AM-PAC PT "6 Clicks" Mobility  Outcome Measure Help needed turning from your back to your side while in a flat bed without using bedrails?: A Little Help needed moving from lying on your back to sitting on the side of a flat bed without using bedrails?: A  Little Help needed moving to and from a bed to a chair (including a wheelchair)?: A Little Help needed standing up from a chair using your arms (e.g., wheelchair or bedside chair)?: A Little Help needed to walk in hospital room?: Total Help needed climbing 3-5 steps with  a railing? : Total 6 Click Score: 14    End of Session Equipment Utilized During Treatment: Gait belt Activity Tolerance: Patient tolerated treatment well Patient left: in chair;with call bell/phone within reach;with chair alarm set;with family/visitor present Nurse Communication: Mobility status;Weight bearing status PT Visit Diagnosis: Unsteadiness on feet (R26.81);Difficulty in walking, not elsewhere classified (R26.2)    Time: 8159-4707 PT Time Calculation (min) (ACUTE ONLY): 32 min   Charges:   PT Evaluation $PT Eval Moderate Complexity: 1 Mod          Marisa Severin, PT, DPT Acute Rehabilitation Services Secure chat preferred Office [place] 08/21/2021, 4:07 PM

## 2021-08-21 NOTE — TOC Initial Note (Signed)
Transition of Care [facility]) - Initial/Assessment Note    Patient Details  Name: Monique Ballard MRN: [id number] Date of Birth: 1940/10/21  Transition of Care [facility]) CM/SW Contact:    Tom-Johnson, Renea Ee, RN Phone Number: 08/21/2021, 5:27 PM  Clinical Narrative:                  CM spoke with patient and daughter, Monique Ballard at bedside about needs for post hospital transition. Admitted for Acute Ischemic Stroke. Neuro following.  From home with husband and son, Monique Ballard. Patient was independent prior to recent hospitalization on 07/27/21. Has PCS from [employer] M-F from 9am-5pm. Currently active with [facility] for home health PT/OT services. Has all necessary DME's at home.  PCP is Monique Branch, MD and uses [facility] on [address].  PT recommended Outpatient PT. Info on AVS.  CM will continue to follow with needs as patient progresses with care.   Expected Discharge Plan: OP Rehab Barriers to Discharge: Continued Medical Work up   Patient Goals and CMS Choice Patient states their goals for this hospitalization and ongoing recovery are:: To return home CMS Medicare.gov Compare Post Acute Care list provided to:: Patient Choice offered to / list presented to : Patient, Adult Children (Daughter, Monique Ballard)  Expected Discharge Plan and Services Expected Discharge Plan: OP Rehab   Discharge Planning Services: CM Consult Post Acute Care Choice:  (Outpatient PT) Living arrangements for the past 2 months: Single Family Home                 DME Arranged: N/A DME Agency: NA       HH Arranged: NA HH Agency: NA        Prior Living Arrangements/Services Living arrangements for the past 2 months: Single Family Home Lives with:: Spouse, Adult Children (Son) Patient language and need for interpreter reviewed:: Yes Do you feel safe going back to the place where you live?: Yes      Need for Family Participation in Patient Care: Yes (Comment) Care giver support system in place?:  Yes (comment)   Criminal Activity/Legal Involvement Pertinent to Current Situation/Hospitalization: No - Comment as needed  Activities of Daily Living      Permission Sought/Granted Permission sought to share information with : Case Manager, Customer service manager, Family Supports Permission granted to share information with : Yes, Verbal Permission Granted              Emotional Assessment Appearance:: Appears stated age Attitude/Demeanor/Rapport: Engaged, Gracious Affect (typically observed): Accepting, Appropriate, Calm, Hopeful, Pleasant Orientation: : Oriented to Self, Oriented to Place, Oriented to  Time, Oriented to Situation Alcohol / Substance Use: Not Applicable Psych Involvement: No (comment)  Admission diagnosis:  Aphasia [R47.01] Acute ischemic stroke [facility]) [I63.9] Patient Active Problem List   Diagnosis Date Noted   Acute ischemic stroke ([place]) 08/20/2021   Closed fracture of lateral malleolus of right fibula 08/07/2021   Debility 07/15/2021   Platypnea-orthodeoxia syndrome    Polycythemia vera, acquired ([place])    Hypoxia 07/08/2021   Nocturnal hypoxia 07/26/2020   Alcohol dependency ([place]) 05/15/2018   Tremor 02/24/2015   Gait disorder 02/24/2015   PCP NOTES >>>>>>> 12/13/2014   Osteoporosis 10/04/2011   Annual physical exam 09/07/2010   GERD 08/28/2009   Anxiety and depression 10/06/2006   Essential hypertension 10/06/2006   BREAST CANCER, HX OF 10/06/2006   PCP:  Monique Branch, MD Pharmacy:   [facility] [address] [place]  [place] [address] Phone: [phone] Fax: [phone]     Social Determinants of Health (SDOH) Interventions    Readmission Risk Interventions     No data to display

## 2021-08-21 NOTE — Consult Note (Signed)
CARDIOLOGY CONSULT NOTE  Patient ID: Monique Ballard MRN: [id number] DOB/AGE: [birth year] 81 y.o.  Admit date: 08/20/2021 Referring Physician  Antony Contras, MD Primary Physician:  Colon Branch, MD Reason for Consultation  Embolic stroke  Patient ID: Monique Ballard, female    DOB: 04/28/1940, 81 y.o.   MRN: [id number]  Chief Complaint  Patient presents with   Code Stroke   HPI:    Monique Ballard  is a 81 y.o. Caucasian female patient with tachypnea orthodeoxia syndrome SP closure of the patent foramen ovale with a 25 mm Amplatzer  septal occluder on 07/12/2021.  She presented to the hospital on 08/21/2021 with dysarthria and received TNKase for stroke with complete resolution of symptoms.  She had similar episode about a week ago but mild and spontaneously resolved.  She completed 30-day course of Plavix about a week ago.  Presently asymptomatic.  States that since PFO closure, she has not used any oxygen.  Overall her quality of life is much improved.  Her daughter is present.  Past Medical History:  Diagnosis Date   Anxiety    Breast cancer ([place]) fall 1997    Left Lumpectomy, XRT.  treated with Tamoxifem and Evista   Depression    Endometriosis in her 20's   surgery to help clear endometriosis to obtain a pregnancy.   GERD (gastroesophageal reflux disease)    w/"stretching" remotely   HTN (hypertension)    Migraines    Past Surgical History:  Procedure Laterality Date   BREAST LUMPECTOMY Left fall 1997   Lymph nodes X 3 were negative, ER+, Radiation treatmetn.  Took Tamoxifem X 5 yrs then Evista for 3-5 yrs.   CESAREAN SECTION     x2  first pregnancy breach and second normal   PATENT FORAMEN OVALE(PFO) CLOSURE N/A 07/12/2021   Procedure: PATENT FORAMEN OVALE(PFO) CLOSURE;  Surgeon: Adrian Prows, MD;  Location: [facility];  Service: Cardiovascular;  Laterality: N/A;   RIGHT HEART CATH N/A 07/12/2021   Procedure: RIGHT HEART CATH;  Surgeon: Adrian Prows, MD;  Location: [facility];  Service: Cardiovascular;  Laterality: N/A;   surgery for endometriosis     Social History   Tobacco Use   Smoking status: Former    Packs/day: 0.50    Years: 25.00    Total pack years: 12.50    Types: Cigarettes    Quit date: 1990    Years since quitting: 33.6   Smokeless tobacco: Never  Substance Use Topics   Alcohol use: Not Currently    Family History  Problem Relation Age of Onset   Hypertension Mother    Stroke Mother    Heart attack Father        elderly   Heart disease Father        CHF   Throat cancer Other    Breast cancer Neg Hx    Colon cancer Neg Hx    Diabetes Neg Hx    Neuropathy Neg Hx     Marital Status: Married  ROS  Review of Systems  Cardiovascular:  Negative for chest pain, dyspnea on exertion and leg swelling.   Objective      08/21/2021   10:30 AM 08/21/2021   10:00 AM 08/21/2021    9:30 AM  Vitals with BMI  Systolic 097 353 299  Diastolic 93 88 91  Pulse 79 76 82    Blood pressure (!) 151/93, pulse 79, temperature 98.3 F (36.8 C), temperature source  Oral, resp. rate (!) 23, weight 56.6 kg, last menstrual period 01/22/1992, SpO2 92 %.    Physical Exam Neck:     Vascular: No JVD.  Cardiovascular:     Rate and Rhythm: Normal rate and regular rhythm.     Pulses: Intact distal pulses.     Heart sounds: Normal heart sounds. No murmur heard.    No gallop.  Pulmonary:     Effort: Pulmonary effort is normal.     Breath sounds: Normal breath sounds.  Abdominal:     General: Bowel sounds are normal.     Palpations: Abdomen is soft.  Musculoskeletal:     Right lower leg: No edema.     Left lower leg: No edema.    Laboratory examination:   Recent Labs    07/17/21 0630 07/18/21 0541 08/20/21 1320 08/20/21 1321  NA 137 138 136 138  K 4.6 3.9 3.8 3.8  CL 108 109 108 106  CO2 25 23 19*  --   GLUCOSE 99 100* 98 98  BUN 7* 6* 8 7*  CREATININE 0.68 0.61 0.71 0.60  CALCIUM 8.9 8.7* 9.2  --   GFRNONAA >60 >60 >60  --     estimated creatinine clearance is 47.9 mL/min (by C-G formula based on SCr of 0.6 mg/dL).     Latest Ref Rng & Units 08/20/2021    1:21 PM 08/20/2021    1:20 PM 07/18/2021    5:41 AM  CMP  Glucose 70 - 99 mg/dL 98  98  100   BUN 8 - 23 mg/dL '7  8  6   '$ Creatinine 0.44 - 1.00 mg/dL 0.60  0.71  0.61   Sodium 135 - 145 mmol/L 138  136  138   Potassium 3.5 - 5.1 mmol/L 3.8  3.8  3.9   Chloride 98 - 111 mmol/L 106  108  109   CO2 22 - 32 mmol/L  19  23   Calcium 8.9 - 10.3 mg/dL  9.2  8.7   Total Protein 6.5 - 8.1 g/dL  6.8    Total Bilirubin 0.3 - 1.2 mg/dL  0.8    Alkaline Phos 38 - 126 U/L  67    AST 15 - 41 U/L  20    ALT 0 - 44 U/L  12        Latest Ref Rng & Units 08/20/2021    1:21 PM 08/20/2021    1:20 PM 07/18/2021    5:41 AM  CBC  WBC 4.0 - 10.5 K/uL  10.2  10.8   Hemoglobin 12.0 - 15.0 g/dL 13.9  13.6  11.5   Hematocrit 36.0 - 46.0 % 41.0  39.4  33.2   Platelets 150 - 400 K/uL  348  285    Lipid Panel No results for input(s): "CHOL", "TRIG", "LDLCALC", "VLDL", "HDL", "CHOLHDL", "LDLDIRECT" in the last 8760 hours.  HEMOGLOBIN A1C Lab Results  Component Value Date   HGBA1C 4.7 (L) 08/20/2021   MPG 88.19 08/20/2021   TSH Recent Labs    07/08/21 1803  TSH 1.324   BNP (last 3 results) Recent Labs    07/08/21 0932  BNP 96.3    Medications and allergies   Allergies  Allergen Reactions   Penicillins Rash    Has patient had a PCN reaction causing immediate rash, facial/tongue/throat swelling, SOB or lightheadedness with hypotension: Yes Has patient had a PCN reaction causing severe rash involving mucus membranes or skin necrosis: No Has patient had  a PCN reaction that required hospitalization No Has patient had a PCN reaction occurring within the last 10 years: No If all of the above answers are "NO", then may proceed with Cephalosporin use.      Current Meds  Medication Sig   aspirin EC 81 MG tablet Take 1 tablet (81 mg total) by mouth daily. Swallow  whole.   escitalopram (LEXAPRO) 20 MG tablet Take 1 tablet (20 mg total) by mouth daily.   folic acid (FOLVITE) 1 MG tablet Take 1 tablet (1 mg total) by mouth daily.   metoprolol tartrate (LOPRESSOR) 25 MG tablet Take 0.5 tablets (12.5 mg total) by mouth 2 (two) times daily.   Multiple Vitamin (MULTIVITAMIN WITH MINERALS) TABS tablet Take 1 tablet by mouth daily.   Nystatin (GERHARDT'S BUTT CREAM) CREA Apply 1 Application topically 3 (three) times daily.   pantoprazole (PROTONIX) 40 MG tablet Take 1 tablet (40 mg total) by mouth daily.   thiamine 100 MG tablet Take 1 tablet (100 mg total) by mouth daily.    Scheduled Meds:   stroke: early stages of recovery book   Does not apply Once   Chlorhexidine Gluconate Cloth  6 each Topical Daily   Chlorhexidine Gluconate Cloth  6 each Topical Q0600   escitalopram  20 mg Oral Daily   labetalol  20 mg Intravenous Once   mupirocin ointment  1 Application Nasal BID   pantoprazole (PROTONIX) IV  40 mg Intravenous QHS   Continuous Infusions:  sodium chloride Stopped (08/20/21 1750)   clevidipine     PRN Meds:.acetaminophen **OR** acetaminophen (TYLENOL) oral liquid 160 mg/5 mL **OR** acetaminophen, mouth rinse, senna-docusate   I/O last 3 completed shifts: In: 165.1 [I.V.:165.1] Out: 200 [Urine:200] No intake/output data recorded.    Radiology:   Imaging results have been reviewed MRI of the brain and CT angiogram of the brain and head and neck: Motion artifact, no significant abnormality noted.  Cardiac Studies:   Echocardiogram 08/21/2021: 1. Left ventricular ejection fraction, by estimation, is 60 to 65%. The left ventricle has normal function. The left ventricle has no regional wall motion abnormalities. There is mild left ventricular hypertrophy. Left ventricular diastolic parameters  were normal.  2. Right ventricular systolic function is normal. The right ventricular size is normal.  3. S/p 57m Amplatzer Talisman septal occluder  well seated, no residual interatrial shunting, no obvious thrombus or vegetation on the device. Agitated saline study was performed using subcostal images (no apical images) and no residual intracardiac  shunting noted. . Agitated saline contrast bubble study was negative, with no evidence of any interatrial shunt.  4. A small pericardial effusion is present. The pericardial effusion is circumferential.  5. The mitral valve is grossly normal. No evidence of mitral valve regurgitation. No evidence of mitral stenosis.  6. The aortic valve is tricuspid. Aortic valve regurgitation is not visualized. No aortic stenosis is present.  7. The inferior vena cava is normal in size with greater than 50% respiratory variability, suggesting right atrial pressure of 3 mmHg.   Comparison(s): A prior study was performed on 07/09/2021. Grade I diastolic dysfunction is now normal and Bubble study is no longer positive and patient is now s/p 221mAmplatzer.  EKG:  EKG 09/09/2021: Normal sinus rhythm.  Assessment   1.  TIA versus small stroke complete resolution of symptoms with TNKase.  Etiology small thrombus on the recently placed interatrial septal occluder versus device induced atrial fibrillation versus age is a risk factor for atrial fibrillation.  2.  Platypnea orthodeoxia syndrome completely resolved with ASD/PFO repair on 07/12/2021  Recommendations:   Echocardiogram reviewed, no residual defect and no obvious thrombus seen.  MRI of the heart will not be helpful as small thrombi can be easily missed.  Unable to do TEE due to cervical esophageal stenosis, images reviewed with GI who also recommend not to proceed with TEE without EGD.  Best option is to proceed with aspirin indefinitely and start Eliquis for 2 to 3 months until complete endothelialization of the interatrial septal occluder.  Could consider placement of a loop recorder to evaluate for atrial fibrillation as well.  I have personally discussed  with Dr. Leonie Man regarding this.  We will follow her in the outpatient setting.   Adrian Prows, MD, [facility] 08/21/2021, 1:05 PM Office: [phone]

## 2021-08-22 ENCOUNTER — Other Ambulatory Visit (HOSPITAL_COMMUNITY): Payer: Self-pay

## 2021-08-22 DIAGNOSIS — I639 Cerebral infarction, unspecified: Secondary | ICD-10-CM | POA: Diagnosis not present

## 2021-08-22 LAB — ECHOCARDIOGRAM COMPLETE BUBBLE STUDY
Area-P 1/2: 2.87 cm2
S' Lateral: 2 cm
Weight: 1996.49 oz

## 2021-08-22 LAB — GLUCOSE, CAPILLARY: Glucose-Capillary: 104 mg/dL — ABNORMAL HIGH (ref 70–99)

## 2021-08-22 MED ORDER — PANTOPRAZOLE SODIUM 40 MG PO TBEC: 40.0000 mg | DELAYED_RELEASE_TABLET | Freq: Every day | ORAL | Status: DC

## 2021-08-22 MED ORDER — ROSUVASTATIN CALCIUM 20 MG PO TABS: 20.0000 mg | ORAL_TABLET | Freq: Every day | ORAL | 1 refills | Status: DC

## 2021-08-22 MED ORDER — APIXABAN 5 MG PO TABS: 5.0000 mg | ORAL_TABLET | Freq: Two times a day (BID) | ORAL | 1 refills | Status: DC

## 2021-08-22 NOTE — Progress Notes (Signed)
Pt discharged by wheelchair with husband and daughter with all belongings all questions answered.

## 2021-08-22 NOTE — Discharge Summary (Addendum)
Stroke Discharge Summary  Patient ID: Monique Ballard   MRN: [id number]      DOB: Mar 08, 1940  Date of Admission: 08/20/2021 Date of Discharge: 08/22/2021  Attending Physician:  Antony Contras MD Consultant(s):    cardiology   Patient's PCP:  Colon Branch, MD  DISCHARGE DIAGNOSIS: left hemispheric stroke too small to be seen on MRI versus TIA possibly embolic from recent PFO closure -device thrombosis versus transient paroxysmal A-fib s/p Iv TNk with full recovery Principal Problem:   Acute ischemic stroke ([place]) Platypnoea orthodeoxia syndrome s/p PFO closure   Allergies as of 08/22/2021       Reactions   Penicillins Rash   Has patient had a PCN reaction causing immediate rash, facial/tongue/throat swelling, SOB or lightheadedness with hypotension: Yes Has patient had a PCN reaction causing severe rash involving mucus membranes or skin necrosis: No Has patient had a PCN reaction that required hospitalization No Has patient had a PCN reaction occurring within the last 10 years: No If all of the above answers are "NO", then may proceed with Cephalosporin use.        Medication List     TAKE these medications    apixaban 5 MG Tabs tablet Commonly known as: ELIQUIS Take 1 tablet (5 mg total) by mouth 2 (two) times daily.   aspirin EC 81 MG tablet Take 1 tablet (81 mg total) by mouth daily. Swallow whole.   escitalopram 20 MG tablet Commonly known as: LEXAPRO Take 1 tablet (20 mg total) by mouth daily.   folic acid 1 MG tablet Commonly known as: FOLVITE Take 1 tablet (1 mg total) by mouth daily.   Gerhardt's butt cream Crea Apply 1 Application topically 3 (three) times daily.   metoprolol tartrate 25 MG tablet Commonly known as: LOPRESSOR Take 0.5 tablets (12.5 mg total) by mouth 2 (two) times daily.   multivitamin with minerals Tabs tablet Take 1 tablet by mouth daily.   OLANZapine 7.5 MG tablet Commonly known as: ZYPREXA TAKE ONE TABLET BY MOUTH DAILY AT  BEDTIME   pantoprazole 40 MG tablet Commonly known as: PROTONIX Take 1 tablet (40 mg total) by mouth daily.   rosuvastatin 20 MG tablet Commonly known as: CRESTOR Take 1 tablet (20 mg total) by mouth daily. Start taking on: August 23, 2021   thiamine 100 MG tablet Commonly known as: VITAMIN B1 Take 1 tablet (100 mg total) by mouth daily.        LABORATORY STUDIES CBC    Component Value Date/Time   WBC 9.7 08/21/2021 1334   RBC 4.64 08/21/2021 1334   HGB 14.3 08/21/2021 1334   HCT 42.1 08/21/2021 1334   PLT 324 08/21/2021 1334   MCV 90.7 08/21/2021 1334   MCH 30.8 08/21/2021 1334   MCHC 34.0 08/21/2021 1334   RDW 13.2 08/21/2021 1334   LYMPHSABS 2.9 08/21/2021 1334   MONOABS 1.7 (H) 08/21/2021 1334   EOSABS 0.1 08/21/2021 1334   BASOSABS 0.1 08/21/2021 1334   CMP    Component Value Date/Time   NA 135 08/21/2021 1334   K 3.7 08/21/2021 1334   CL 107 08/21/2021 1334   CO2 21 (L) 08/21/2021 1334   GLUCOSE 92 08/21/2021 1334   BUN 5 (L) 08/21/2021 1334   CREATININE 0.69 08/21/2021 1334   CREATININE 0.71 03/03/2020 1453   CALCIUM 9.2 08/21/2021 1334   PROT 7.1 08/21/2021 1334   ALBUMIN 3.7 08/21/2021 1334   AST 20 08/21/2021 1334  ALT 13 08/21/2021 1334   ALKPHOS 63 08/21/2021 1334   BILITOT 0.9 08/21/2021 1334   GFRNONAA >60 08/21/2021 1334   GFRAA >60 10/02/2019 0713   COAGS Lab Results  Component Value Date   INR 1.2 08/20/2021   INR 1.1 07/08/2021   Lipid Panel    Component Value Date/Time   CHOL 168 08/21/2021 1334   TRIG 112 08/21/2021 1334   HDL 46 08/21/2021 1334   CHOLHDL 3.7 08/21/2021 1334   VLDL 22 08/21/2021 1334   LDLCALC 100 (H) 08/21/2021 1334   HgbA1C  Lab Results  Component Value Date   HGBA1C 4.7 (L) 08/20/2021   Urinalysis    Component Value Date/Time   COLORURINE YELLOW 08/20/2021 1420   APPEARANCEUR CLEAR 08/20/2021 1420   LABSPEC 1.038 (H) 08/20/2021 1420   PHURINE 8.0 08/20/2021 1420   GLUCOSEU NEGATIVE 08/20/2021  1420   HGBUR NEGATIVE 08/20/2021 1420   BILIRUBINUR NEGATIVE 08/20/2021 1420   KETONESUR NEGATIVE 08/20/2021 1420   PROTEINUR NEGATIVE 08/20/2021 1420   NITRITE NEGATIVE 08/20/2021 1420   LEUKOCYTESUR NEGATIVE 08/20/2021 1420   Urine Drug Screen     Component Value Date/Time   LABOPIA NONE DETECTED 08/20/2021 1420   COCAINSCRNUR NONE DETECTED 08/20/2021 1420   LABBENZ NONE DETECTED 08/20/2021 1420   AMPHETMU NONE DETECTED 08/20/2021 1420   THCU NONE DETECTED 08/20/2021 1420   LABBARB NONE DETECTED 08/20/2021 1420    Alcohol Level    Component Value Date/Time   ETH <10 08/20/2021 1316     SIGNIFICANT DIAGNOSTIC STUDIES ECHOCARDIOGRAM COMPLETE BUBBLE STUDY  Result Date: 08/22/2021    ECHOCARDIOGRAM REPORT   Patient Name:   Monique Ballard Date of Exam: 08/21/2021 Medical Rec #:  [id number]       Height:       63.0 in Accession #:    [id number]      Weight:       124.8 lb Date of Birth:  28-Aug-1940      BSA:          1.582 m Patient Age:    81 years        BP:           146/100 mmHg Patient Gender: F               HR:           86 bpm. Exam Location:  Inpatient Procedure: 2D Echo, Cardiac Doppler, Color Doppler and Saline Contrast Bubble            Study Indications:     ASD (atrial septal defect) 745.5 / Q21.1  History:         Patient has prior history of Echocardiogram examinations, most                  recent 07/13/2021. Risk Factors:Hypertension. GERD. History of                  breast cancer.  Sonographer:     Darlina Sicilian RDCS Referring Phys:  [place] Diagnosing Phys: Rex Kras DO IMPRESSIONS  1. Left ventricular ejection fraction, by estimation, is 60 to 65%. The left ventricle has normal function. The left ventricle has no regional wall motion abnormalities. There is mild left ventricular hypertrophy. Left ventricular diastolic parameters were normal.  2. Right ventricular systolic function is normal. The right ventricular size is normal.  3. S/p 53m Amplatzer  Talisman septal occluder well seated, no residual interatrial shunting,  no obvious thrombus or vegetation on the device. Agitated saline study was performed using subcostal images (no apical images) and no residual intracardiac shunting noted. . Agitated saline contrast bubble study was negative, with no evidence of any interatrial shunt.  4. A small pericardial effusion is present. The pericardial effusion is circumferential.  5. The mitral valve is grossly normal. No evidence of mitral valve regurgitation. No evidence of mitral stenosis.  6. The aortic valve is tricuspid. Aortic valve regurgitation is not visualized. No aortic stenosis is present.  7. The inferior vena cava is normal in size with greater than 50% respiratory variability, suggesting right atrial pressure of 3 mmHg. Comparison(s): A prior study was performed on 07/09/2021. Grade I diastolic dysfunction is now normal and Bubble study is no longer positive and patient is now s/p 44m Amplatzer. FINDINGS  Left Ventricle: Left ventricular ejection fraction, by estimation, is 60 to 65%. The left ventricle has normal function. The left ventricle has no regional wall motion abnormalities. The left ventricular internal cavity size was normal in size. There is  mild left ventricular hypertrophy. Left ventricular diastolic parameters were normal. Right Ventricle: The right ventricular size is normal. No increase in right ventricular wall thickness. Right ventricular systolic function is normal. Left Atrium: Left atrial size was normal in size. Right Atrium: Right atrial size was normal in size. Pericardium: A small pericardial effusion is present. The pericardial effusion is circumferential. Mitral Valve: The mitral valve is grossly normal. No evidence of mitral valve regurgitation. No evidence of mitral valve stenosis. Tricuspid Valve: The tricuspid valve is grossly normal. Tricuspid valve regurgitation is not demonstrated. No evidence of tricuspid stenosis.  Aortic Valve: The aortic valve is tricuspid. Aortic valve regurgitation is not visualized. No aortic stenosis is present. Pulmonic Valve: The pulmonic valve was grossly normal. Pulmonic valve regurgitation is not visualized. No evidence of pulmonic stenosis. Aorta: The aortic root and ascending aorta are structurally normal, with no evidence of dilitation. Venous: The inferior vena cava is normal in size with greater than 50% respiratory variability, suggesting right atrial pressure of 3 mmHg. IAS/Shunts: The atrial septum is grossly normal. Agitated saline contrast was given intravenously to evaluate for intracardiac shunting. Agitated saline contrast bubble study was negative, with no evidence of any interatrial shunt.  LEFT VENTRICLE PLAX 2D LVIDd:         3.50 cm   Diastology LVIDs:         2.00 cm   LV e' medial:    3.37 cm/s LV PW:         0.90 cm   LV E/e' medial:  11.5 LV IVS:        1.50 cm   LV e' lateral:   4.79 cm/s LVOT diam:     2.20 cm   LV E/e' lateral: 8.1 LV SV:         38 LV SV Index:   24 LVOT Area:     3.80 cm  LEFT ATRIUM             Index LA diam:        2.60 cm 1.64 cm/m LA Vol (A2C):   22.3 ml 14.09 ml/m LA Vol (A4C):   35.8 ml 22.62 ml/m LA Biplane Vol: 30.9 ml 19.53 ml/m  AORTIC VALVE LVOT Vmax:   70.00 cm/s LVOT Vmean:  48.800 cm/s LVOT VTI:    0.099 m  AORTA Ao Root diam: 3.40 cm Ao Asc diam:  3.20 cm MITRAL VALVE MV  Area (PHT): 2.87 cm    SHUNTS MV Decel Time: 264 msec    Systemic VTI:  0.10 m MV E velocity: 38.80 cm/s  Systemic Diam: 2.20 cm MV A velocity: 88.00 cm/s MV E/A ratio:  0.44 Sunit Tolia DO Electronically signed by Rex Kras DO Signature Date/Time: 08/22/2021/12:06:05 AM    Final    CT HEAD WO CONTRAST (5MM)  Result Date: 08/21/2021 CLINICAL DATA:  Stroke, follow-up EXAM: CT HEAD WITHOUT CONTRAST TECHNIQUE: Contiguous axial images were obtained from the base of the skull through the vertex without intravenous contrast. RADIATION DOSE REDUCTION: This exam was  performed according to the departmental dose-optimization program which includes automated exposure control, adjustment of the mA and/or kV according to patient size and/or use of iterative reconstruction technique. COMPARISON:  CT head 08/20/2021 and MRI head 08/20/2021 FINDINGS: Brain: No evidence of acute infarction, hemorrhage, mass, mass effect, or midline shift. No hydrocephalus or extra-axial fluid collection. Periventricular white matter changes, likely the sequela of chronic small vessel ischemic disease. Lacunar infarct in the right caudate head. Vascular: No hyperdense vessel. Skull: Normal. Negative for fracture or focal lesion. Sinuses/Orbits: No acute finding. Other: The mastoid air cells are well aerated. IMPRESSION: No acute intracranial process. Electronically Signed   By: Merilyn Baba M.D.   On: 08/21/2021 17:45   CT HEAD CODE STROKE WO CONTRAST  Addendum Date: 08/20/2021   ADDENDUM REPORT: 08/20/2021 19:31 ADDENDUM: Administered contrast 75 mL Omnipaque 350. Electronically Signed   By: Kellie Simmering D.O.   On: 08/20/2021 19:31   Result Date: 08/20/2021 CLINICAL DATA:  Code stroke. Neuro deficit, acute, stroke suspected. Isolated aphasia EXAM: CT ANGIOGRAPHY HEAD AND NECK TECHNIQUE: Multidetector CT imaging of the head and neck was performed using the standard protocol during bolus administration of intravenous contrast. Multiplanar CT image reconstructions and MIPs were obtained to evaluate the vascular anatomy. Carotid stenosis measurements (when applicable) are obtained utilizing NASCET criteria, using the distal internal carotid diameter as the denominator. RADIATION DOSE REDUCTION: This exam was performed according to the departmental dose-optimization program which includes automated exposure control, adjustment of the mA and/or kV according to patient size and/or use of iterative reconstruction technique. CONTRAST:  Administered contrast not known at this time. COMPARISON:  Brain MRI  11/15/2019. Head CT 10/30/2019. Chest CT 07/08/2021. FINDINGS: CT HEAD FINDINGS Mildly motion degraded exam. Brain: Moderate generalized parenchymal atrophy. A small chronic cortical infarct within the left parietal lobe was better appreciated on the prior brain MRI of 11/15/2019. Redemonstrated chronic lacunar infarct within the right caudate nucleus Mild patchy and ill-defined hypoattenuation within the cerebral white matter, nonspecific but compatible chronic small vessel ischemic disease. Redemonstrated small chronic infarct within the right cerebellar hemisphere. There is no acute intracranial hemorrhage. No demarcated cortical infarct. No extra-axial fluid collection. No evidence of an intracranial mass. No midline shift. Vascular: No hyperdense vessel. Atherosclerotic calcifications. Skull: No fracture or aggressive osseous lesion. Sinuses/Orbits: No mass or acute finding within the imaged orbits. No significant paranasal sinus disease. ASPECTS [facility] Stroke Program Early CT Score) Ganglionic level infarction (caudate, lentiform nuclei, internal capsule, insula, M1-M3 cortex): 7 Supraganglionic infarction (M4-M6 cortex): 3 Total score (0-10 with 10 being normal): 10 Other: Trace fluid within the left mastoid air cells. No evidence of acute intracranial abnormality. These results were called by telephone at the time of interpretation on 08/20/2021 at 1:29 pm to provider [facility] , who verbally acknowledged these results. CTA NECK FINDINGS Aortic arch: Standard aortic branching. Atherosclerotic plaque within the  visualized aortic arch and proximal major branch vessels of the neck. No hemodynamically significant innominate or proximal subclavian artery stenosis. Right carotid system: CCA and ICA patent within the neck without hemodynamically significant stenosis (50% or greater). Mild atherosclerotic plaque within the carotid bulb. Left carotid system: CCA and ICA patent within the neck without  hemodynamically significant stenosis (50% or greater). Mild to moderate atherosclerotic plaque within the carotid bulb. 4 mm posteriorly directed pseudoaneurysm arising from the distal cervical ICA (series 100, image 77). Vertebral arteries: Vertebral arteries patent within the neck without significant stenosis. Skeleton: Cervical spondylosis. Facet joint ankylosis on the right at C2-C3 and bilaterally at C3-C4. Chronic T3 superior endplate vertebral compression deformity (with mild height loss). Other neck: No neck mass or cervical lymphadenopathy. Upper chest: No consolidation within the imaged lung apices. Review of the MIP images confirms the above findings CTA HEAD FINDINGS Mildly motion degraded exam. Anterior circulation: The intracranial internal carotid arteries are patent. The M1 middle cerebral arteries are patent. Atherosclerotic irregularity of the M2 and more distal MCA vessels, bilaterally. No M2 proximal branch occlusion or high-grade proximal stenosis is identified. The anterior cerebral arteries are patent. Atherosclerotic irregularity of both vessels without high-grade proximal stenosis. No intracranial aneurysm is identified. Posterior circulation: The intracranial vertebral arteries are patent. Apparent focus of ill-defined hypoattenuation within the mid basilar artery (for instance as seen on series 7, image 118). This may reflect a fenestration or moderate stenosis. However, a small subocclusive thrombus at this site is difficult to exclude. The posterior cerebral arteries are patent. The left PCA is fetal in origin. A small right posterior communicating artery is present. Venous sinuses: Contrast timing limits evaluation of the dural venous sinuses. Anatomic variants: As described. Review of the MIP images confirms the above findings No intracranial large vessel occlusion identified. This finding, and CTA head impression #2 called by telephone at the time of interpretation on 08/20/2021 at  2:13 pm to provider [facility] , who verbally acknowledged these results. IMPRESSION: CT head: 1. Mildly motion degraded exam. 2. No evidence of acute intracranial abnormality. 3. Chronic infarcts within the left parietal cortex, right basal ganglia and right cerebellar hemisphere. 4. Mild chronic small vessel ischemic changes within the cerebral white matter. 5. Moderate generalized atrophy. CTA Neck: 1. The common carotid, internal carotid and vertebral arteries are patent within the neck without hemodynamically significant stenosis (50% or greater). Atherosclerotic plaque within the left greater than right carotid bulbs. 2. 4 mm posteriorly projecting pseudoaneurysm arising from the distal cervical left ICA. 3. Aortic Atherosclerosis (ICD10-I70.0). CTA head: 1. No intracranial large vessel occlusion is identified. 2. Apparent focus of ill-defined hypoattenuation within the mid basilar artery. While this may reflect a fenestration or a moderate atherosclerotic stenosis, a small subocclusive thrombus cannot be excluded. An MRA of the head is recommended for further evaluation. Electronically Signed: By: Kellie Simmering D.O. On: 08/20/2021 14:14   CT ANGIO HEAD NECK W WO CM (CODE STROKE)  Addendum Date: 08/20/2021   ADDENDUM REPORT: 08/20/2021 19:31 ADDENDUM: Administered contrast 75 mL Omnipaque 350. Electronically Signed   By: Kellie Simmering D.O.   On: 08/20/2021 19:31   Result Date: 08/20/2021 CLINICAL DATA:  Code stroke. Neuro deficit, acute, stroke suspected. Isolated aphasia EXAM: CT ANGIOGRAPHY HEAD AND NECK TECHNIQUE: Multidetector CT imaging of the head and neck was performed using the standard protocol during bolus administration of intravenous contrast. Multiplanar CT image reconstructions and MIPs were obtained to evaluate the vascular anatomy. Carotid stenosis measurements (  when applicable) are obtained utilizing NASCET criteria, using the distal internal carotid diameter as the denominator.  RADIATION DOSE REDUCTION: This exam was performed according to the departmental dose-optimization program which includes automated exposure control, adjustment of the mA and/or kV according to patient size and/or use of iterative reconstruction technique. CONTRAST:  Administered contrast not known at this time. COMPARISON:  Brain MRI 11/15/2019. Head CT 10/30/2019. Chest CT 07/08/2021. FINDINGS: CT HEAD FINDINGS Mildly motion degraded exam. Brain: Moderate generalized parenchymal atrophy. A small chronic cortical infarct within the left parietal lobe was better appreciated on the prior brain MRI of 11/15/2019. Redemonstrated chronic lacunar infarct within the right caudate nucleus Mild patchy and ill-defined hypoattenuation within the cerebral white matter, nonspecific but compatible chronic small vessel ischemic disease. Redemonstrated small chronic infarct within the right cerebellar hemisphere. There is no acute intracranial hemorrhage. No demarcated cortical infarct. No extra-axial fluid collection. No evidence of an intracranial mass. No midline shift. Vascular: No hyperdense vessel. Atherosclerotic calcifications. Skull: No fracture or aggressive osseous lesion. Sinuses/Orbits: No mass or acute finding within the imaged orbits. No significant paranasal sinus disease. ASPECTS [facility] Stroke Program Early CT Score) Ganglionic level infarction (caudate, lentiform nuclei, internal capsule, insula, M1-M3 cortex): 7 Supraganglionic infarction (M4-M6 cortex): 3 Total score (0-10 with 10 being normal): 10 Other: Trace fluid within the left mastoid air cells. No evidence of acute intracranial abnormality. These results were called by telephone at the time of interpretation on 08/20/2021 at 1:29 pm to provider [facility] , who verbally acknowledged these results. CTA NECK FINDINGS Aortic arch: Standard aortic branching. Atherosclerotic plaque within the visualized aortic arch and proximal major branch vessels of the  neck. No hemodynamically significant innominate or proximal subclavian artery stenosis. Right carotid system: CCA and ICA patent within the neck without hemodynamically significant stenosis (50% or greater). Mild atherosclerotic plaque within the carotid bulb. Left carotid system: CCA and ICA patent within the neck without hemodynamically significant stenosis (50% or greater). Mild to moderate atherosclerotic plaque within the carotid bulb. 4 mm posteriorly directed pseudoaneurysm arising from the distal cervical ICA (series 100, image 77). Vertebral arteries: Vertebral arteries patent within the neck without significant stenosis. Skeleton: Cervical spondylosis. Facet joint ankylosis on the right at C2-C3 and bilaterally at C3-C4. Chronic T3 superior endplate vertebral compression deformity (with mild height loss). Other neck: No neck mass or cervical lymphadenopathy. Upper chest: No consolidation within the imaged lung apices. Review of the MIP images confirms the above findings CTA HEAD FINDINGS Mildly motion degraded exam. Anterior circulation: The intracranial internal carotid arteries are patent. The M1 middle cerebral arteries are patent. Atherosclerotic irregularity of the M2 and more distal MCA vessels, bilaterally. No M2 proximal branch occlusion or high-grade proximal stenosis is identified. The anterior cerebral arteries are patent. Atherosclerotic irregularity of both vessels without high-grade proximal stenosis. No intracranial aneurysm is identified. Posterior circulation: The intracranial vertebral arteries are patent. Apparent focus of ill-defined hypoattenuation within the mid basilar artery (for instance as seen on series 7, image 118). This may reflect a fenestration or moderate stenosis. However, a small subocclusive thrombus at this site is difficult to exclude. The posterior cerebral arteries are patent. The left PCA is fetal in origin. A small right posterior communicating artery is present.  Venous sinuses: Contrast timing limits evaluation of the dural venous sinuses. Anatomic variants: As described. Review of the MIP images confirms the above findings No intracranial large vessel occlusion identified. This finding, and CTA head impression #2 called by telephone at  the time of interpretation on 08/20/2021 at 2:13 pm to provider [facility] , who verbally acknowledged these results. IMPRESSION: CT head: 1. Mildly motion degraded exam. 2. No evidence of acute intracranial abnormality. 3. Chronic infarcts within the left parietal cortex, right basal ganglia and right cerebellar hemisphere. 4. Mild chronic small vessel ischemic changes within the cerebral white matter. 5. Moderate generalized atrophy. CTA Neck: 1. The common carotid, internal carotid and vertebral arteries are patent within the neck without hemodynamically significant stenosis (50% or greater). Atherosclerotic plaque within the left greater than right carotid bulbs. 2. 4 mm posteriorly projecting pseudoaneurysm arising from the distal cervical left ICA. 3. Aortic Atherosclerosis (ICD10-I70.0). CTA head: 1. No intracranial large vessel occlusion is identified. 2. Apparent focus of ill-defined hypoattenuation within the mid basilar artery. While this may reflect a fenestration or a moderate atherosclerotic stenosis, a small subocclusive thrombus cannot be excluded. An MRA of the head is recommended for further evaluation. Electronically Signed: By: Kellie Simmering D.O. On: 08/20/2021 14:14   MR ANGIO HEAD WO CONTRAST  Result Date: 08/20/2021 CLINICAL DATA:  Acute neurologic deficit EXAM: MRI HEAD WITHOUT CONTRAST MRA HEAD WITHOUT CONTRAST TECHNIQUE: Multiplanar, multi-echo pulse sequences of the brain and surrounding structures were acquired without intravenous contrast. Angiographic images of the Circle of Willis were acquired using MRA technique without intravenous contrast. COMPARISON:  None Available. FINDINGS: Examination is markedly  degraded by motion. MRI HEAD FINDINGS Brain: No acute infarct, mass effect or extra-axial collection. No acute or chronic hemorrhage. There is multifocal hyperintense T2-weighted signal within the white matter. Generalized volume loss. Old, punctate small vessel infarct of the left cerebellum. The midline structures are normal. Vascular: Major flow voids are preserved. Skull and upper cervical spine: Normal calvarium and skull base. Visualized upper cervical spine and soft tissues are normal. Sinuses/Orbits:No paranasal sinus fluid levels or advanced mucosal thickening. No mastoid or middle ear effusion. Normal orbits. MRA HEAD FINDINGS Images are severely motion degraded. Anterior circulation: There is no proximal occlusion of the anterior or middle cerebral arteries. There is some atherosclerotic irregularity of the internal carotid arteries at the skull base. Both posterior communicating arteries are patent. Posterior circulation: Diminutive distal basilar artery. Vertebral arteries are patent along the visualized course. Both posterior cerebral arteries are patent. IMPRESSION: 1. Severely motion degraded examination. 2. No acute intracranial abnormality. 3. Chronic small vessel ischemic changes and volume loss. 4. No emergent large vessel occlusion. Electronically Signed   By: Ulyses Jarred M.D.   On: 08/20/2021 19:03   MR BRAIN WO CONTRAST  Result Date: 08/20/2021 CLINICAL DATA:  Acute neurologic deficit EXAM: MRI HEAD WITHOUT CONTRAST MRA HEAD WITHOUT CONTRAST TECHNIQUE: Multiplanar, multi-echo pulse sequences of the brain and surrounding structures were acquired without intravenous contrast. Angiographic images of the Circle of Willis were acquired using MRA technique without intravenous contrast. COMPARISON:  None Available. FINDINGS: Examination is markedly degraded by motion. MRI HEAD FINDINGS Brain: No acute infarct, mass effect or extra-axial collection. No acute or chronic hemorrhage. There is  multifocal hyperintense T2-weighted signal within the white matter. Generalized volume loss. Old, punctate small vessel infarct of the left cerebellum. The midline structures are normal. Vascular: Major flow voids are preserved. Skull and upper cervical spine: Normal calvarium and skull base. Visualized upper cervical spine and soft tissues are normal. Sinuses/Orbits:No paranasal sinus fluid levels or advanced mucosal thickening. No mastoid or middle ear effusion. Normal orbits. MRA HEAD FINDINGS Images are severely motion degraded. Anterior circulation: There is no proximal occlusion  of the anterior or middle cerebral arteries. There is some atherosclerotic irregularity of the internal carotid arteries at the skull base. Both posterior communicating arteries are patent. Posterior circulation: Diminutive distal basilar artery. Vertebral arteries are patent along the visualized course. Both posterior cerebral arteries are patent. IMPRESSION: 1. Severely motion degraded examination. 2. No acute intracranial abnormality. 3. Chronic small vessel ischemic changes and volume loss. 4. No emergent large vessel occlusion. Electronically Signed   By: Ulyses Jarred M.D.   On: 08/20/2021 19:03      HISTORY OF PRESENT ILLNESS Monique Ballard is a 81 y.o. female with history of HTN, dementia, BRCA, migraines, alcohol dependence, anxiety and recent PFO closure to treat persistent hypoxia presenting with  acute onset expressive aphasia.  She was brought to the hospital and given TNK to treat her stroke.  Her symptoms have improved.  Patient reportedly had a similar episode of expressive aphasia about a week ago that resolved spontaneously.  She had a procedure to close a PFO in June of this year.  MRI reveals no acute abnormality, so stroke is likely to small to be seen. She has been started on 2 months of Eliquis with ASA 51m and is to follow up with cardiology outpatient as well.   HOSPITAL COURSE Stroke:  left  hemispheric stroke too small to be seen on MRI versus left hemispheric TIA Etiology:  possibly embolic from recent PFO closure -device thrombosis versus transient paroxysmal A-fib Code Stroke CT head No acute abnormality. Small vessel disease.  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  For presumed device thrombosis versus transient postprocedure A-fib for 2 months followed by aspirin alone Therapy recommendations: None necessary  disposition: None Hypertension Home meds:  none Stable Keep BP <180/105 Long-term BP goal normotensive   Hyperlipidemia Home meds:  none LDL 100, goal < 70 Add rosuvastatin 20 mg daily Continue statin at discharge     Other Stroke Risk Factors Advanced Age >/= [age]Former cigarette smoker   Other Active Problems Recent PFO closure Unable to obtain TEE due to esophageal stricture and cardiac MRI unlikely to detect thrombus.   Will anticoagulate with Eliquis for two months in case of postoperative atrial fibrillation or device related thrombus  DISCHARGE EXAM Blood pressure 136/88, pulse 87, temperature 97.8 F (36.6 C), temperature source Oral, resp. rate (!) 23, weight 56.6 kg, last menstrual period 01/22/1992, SpO2 94 %.  PHYSICAL EXAM General:  Alert, well-developed, well-nourished patient in no acute distress Respiratory: Regular, unlabored respirations on room air   NEURO:  Mental Status: AA&Ox3  Speech/Language: speech is without dysarthria or  aphasia.  Naming and repetition impaired, fluency slightly impaired, comprehension intact.   Cranial Nerves:  II: PERRL. Visual fields full.  III, IV, VI: EOMI. Eyelids elevate symmetrically.  V: Sensation is intact to light touch and symmetrical to face.  VII: Smile is symmetrical.  VIII: hearing intact to voice. IX, X:Phonation is normal.  XIGT:XMIWOEHOhrug 5/5. XII: tongue is midline without fasciculations. Motor: 5/5  strength to all muscle groups tested.  Tone: is normal and bulk is normal Sensation- Intact to light touch bilaterally.   Coordination: FTN intact bilaterally.No drift.  Gait- deferred    Discharge Diet       Diet   Diet Heart Room service appropriate? Yes; Fluid consistency: Thin   liquids  DISCHARGE PLAN Disposition:  Home  aspirin 81 mg daily and Eliquis (apixaban) daily for secondary stroke prevention for 2 months then Aspirin 37m alone. Ongoing stroke risk factor control by Primary Care Physician at time of discharge Follow-up PCP PColon Branch MD in 2 weeks. [facility], office to schedule an appointment.  Follow up with cardiology  Follow up with orthopedics   33 minutes were spent preparing discharge.  Patient seen and examined by NP/APP with MD. MD to update note as needed.   DJanine Ores DNP, FNP-BC [facility] Pager: [phone] I have personally obtained history,examined this patient, reviewed notes, independently viewed imaging studies, participated in medical decision making and plan of care.ROS completed by me personally and pertinent positives fully documented  I have made any additions or clarifications directly to the above note. Agree with note above.    PAntony Contras MD Medical Director [facility] Pager: [phone]/02/2021 1:10 PM

## 2021-08-22 NOTE — TOC Transition Note (Signed)
Transition of Care [facility] [address]) - CM/SW Discharge Note   Patient Details  Name: Monique Ballard MRN: [id number] Date of Birth: Oct 02, 1940  [facility] CM/SW Contact:  Tom-Johnson, Renea Ee, RN Phone Number: 08/22/2021, 1:17 PM   Clinical Narrative:     Patient is scheduled for discharge today. Outpatient info on AVS. Family to transport at discharge. No Further TOC needs noted.  Final next level of care: Home/Self Care Barriers to Discharge: Barriers Resolved   Patient Goals and CMS Choice Patient states their goals for this hospitalization and ongoing recovery are:: To return home CMS Medicare.gov Compare Post Acute Care list provided to:: Patient Choice offered to / list presented to : Patient, Adult Children (Daughter, [name])  Discharge Placement                Patient to be transferred to facility by: Daughter      Discharge Plan and Services   Discharge Planning Services: CM Consult Post Acute Care Choice:  (Outpatient PT)          DME Arranged: N/A DME Agency: NA       HH Arranged: NA HH Agency: NA        Social Determinants of Health (SDOH) Interventions     Readmission Risk Interventions     No data to display

## 2021-08-23 ENCOUNTER — Telehealth: Payer: Self-pay | Admitting: *Deleted

## 2021-08-23 NOTE — Patient Outreach (Signed)
  Care Coordination [facility] Note Transition Care Management Follow-up Telephone Call Date of discharge and from where: 08/22/21 [facility] How have you been since you were released from the hospital? Per caregiver/ daughter [name] on [facility] DPR: "She is doing great; she is at baseline and has no deficits from the stroke; she is so much better" Any questions or concerns? No  Items Reviewed: Did the pt receive and understand the discharge instructions provided? Yes  Medications obtained and verified? Yes  Other? No  Any new allergies since your discharge? No  Dietary orders reviewed? Yes Do you have support at home? Yes   Home Care and Equipment/Supplies: Were home health services ordered? no If so, what is the name of the agency? N/A  Has the agency set up a time to come to the patient's home? not applicable Were any new equipment or medical supplies ordered?  No What is the name of the medical supply agency? N/A Were you able to get the supplies/equipment? not applicable Do you have any questions related to the use of the equipment or supplies? No  Functional Questionnaire: (I = Independent and D = Dependent) ADLs: D has private duty personal care assistant at home every day; family assisting as needed  Bathing/Dressing- D has private duty personal care assistant at home every day; family assisting as needed  Meal Prep- D has private duty personal care assistant at home every day; family assisting as needed  Eating- I  Maintaining continence- I has private duty personal care assistant at home every day; family assisting as needed  Transferring/Ambulation- D has private duty personal care assistant at home every day; family assisting as needed  Managing Meds- D has private duty personal care assistant at home every day; family assisting as needed  Follow up appointments reviewed:  PCP Hospital f/u appt confirmed? No  Scheduled to see - on - @ - daughter confirms specialists  appointments have been made and /or are in progress in scheduling; confirms she will schedule PCP appointment after she sees specialist on 08/28/21 Specialist Hospital f/u appt confirmed? Yes  Scheduled to see cardiology on 08/28/21 @ 3:45 pm Are transportation arrangements needed? No  If their condition worsens, is the pt aware to call PCP or go to the Emergency Dept.? Yes Was the patient provided with contact information for the PCP's office or ED? Yes Was to pt encouraged to call back with questions or concerns? Yes  SDOH assessments and interventions completed:   Yes  Care Coordination Interventions Activated:  Yes   Care Coordination Interventions:   clarified with caregiver discharge recommendations for all specialists and PCP post-hospital visits     Encounter Outcome:  Pt. Visit Completed    Oneta Rack, RN, BSN, [place] RN [place] [facility] [phone]: direct office

## 2021-08-24 ENCOUNTER — Telehealth: Payer: Self-pay

## 2021-08-24 DIAGNOSIS — S8261XA Displaced fracture of lateral malleolus of right fibula, initial encounter for closed fracture: Secondary | ICD-10-CM | POA: Diagnosis not present

## 2021-08-24 NOTE — Telephone Encounter (Signed)
Location of hospitalization: [place] Reason for hospitalization: Daughter believed patient was having a stroke therefore took her to the ED Date of discharge: Wednesday 08/22/2021 Date of first communication with patient: today Person contacting patient: Michail Sermon  Current symptoms: Patient is doing ok  Do you understand why you were in the Hospital: Yes Questions regarding discharge instructions: None Where were you discharged to: Home Medications reviewed: Yes Allergies reviewed: Yes Dietary changes reviewed: Yes. Discussed low fat and low salt diet.  Referals reviewed: NA Activities of Daily Living: Able to with mild limitations Any transportation issues/concerns: None Any patient concerns: None Confirmed importance & date/time of Follow up appt: Yes Confirmed with patient if condition begins to worsen call. Pt was given the office number and encouraged to call back with questions or concerns: Yes

## 2021-08-28 ENCOUNTER — Other Ambulatory Visit: Payer: Self-pay | Admitting: Internal Medicine

## 2021-08-28 ENCOUNTER — Encounter: Payer: Self-pay | Admitting: Cardiology

## 2021-08-28 ENCOUNTER — Ambulatory Visit: Payer: Medicare Other | Admitting: Cardiology

## 2021-08-28 VITALS — BP 100/63 | HR 71 | Temp 97.9°F | Resp 16 | Ht 63.0 in | Wt 111.6 lb

## 2021-08-28 DIAGNOSIS — I639 Cerebral infarction, unspecified: Secondary | ICD-10-CM | POA: Diagnosis not present

## 2021-08-28 DIAGNOSIS — Z8774 Personal history of (corrected) congenital malformations of heart and circulatory system: Secondary | ICD-10-CM | POA: Diagnosis not present

## 2021-08-28 DIAGNOSIS — R0609 Other forms of dyspnea: Secondary | ICD-10-CM | POA: Diagnosis not present

## 2021-08-28 DIAGNOSIS — I10 Essential (primary) hypertension: Secondary | ICD-10-CM | POA: Diagnosis not present

## 2021-08-28 DIAGNOSIS — Z8673 Personal history of transient ischemic attack (TIA), and cerebral infarction without residual deficits: Secondary | ICD-10-CM | POA: Diagnosis not present

## 2021-08-28 DIAGNOSIS — F4001 Agoraphobia with panic disorder: Secondary | ICD-10-CM

## 2021-08-28 DIAGNOSIS — F411 Generalized anxiety disorder: Secondary | ICD-10-CM

## 2021-08-28 DIAGNOSIS — F331 Major depressive disorder, recurrent, moderate: Secondary | ICD-10-CM

## 2021-08-28 NOTE — Progress Notes (Signed)
ID:  Monique Ballard, DOB 02/01/1940, MRN [id number]  PCP:  Colon Branch, MD  Cardiologist:  Rex Kras, DO, [facility] (established care 07/10/2021)  Date: 08/28/21 Last Office Visit: 08/02/2021   Chief Complaint  Patient presents with   Acute ischemic stroke    Hospitalization Follow-up    HPI  Monique Ballard is a 81 y.o. Caucasian female whose past medical history and cardiovascular risk factors include: Platypnea orthodeoxia syndrome, status post PFO closure, hypertension, left-sided breast cancer status post lumpectomy/radiation/tamoxifen, anxiety, depression, GERD, esophageal strictures, aortic atherosclerosis, ectasia of the ascending thoracic aorta 39 mm (CT scan 07/08/2021).  Patient was hospitalized in June 2023 for shortness of breath and chronic hypoxia and based on the work-up her symptoms were classic of platypnea orthodeoxia syndrome.  She underwent PFO closure and is no longer requiring chronic oxygen.  She underwent PFO closure on 07/12/2021 with 25 mm Amplatzer septal occluder.  She was recommended to be on dual antiplatelet therapy for 30 days and aspirin indefinitely.  She is also made aware of endocarditis prophylaxis for the first 6 months.   Unfortunately, she was noted to have expressive aphasia on 08/20/2021 and was brought to the ED for further evaluation and management.  She was given TNK and subsequently had no significant residual deficits.  At the time of discharge she was recommended to be on aspirin and to initiate Eliquis for 3 months to help promote endothelialization of the device.  Patient does not endorse evidence of bleeding.  Since discharge she has not had any focal neurological deficits and her expressive aphasia is resolved.  She denies anginal discomfort or heart failure symptoms.  FUNCTIONAL STATUS: Limited as she is currently in a wheelchair  ALLERGIES: Allergies  Allergen Reactions   Penicillins Rash    Has patient had a PCN reaction causing  immediate rash, facial/tongue/throat swelling, SOB or lightheadedness with hypotension: Yes Has patient had a PCN reaction causing severe rash involving mucus membranes or skin necrosis: No Has patient had a PCN reaction that required hospitalization No Has patient had a PCN reaction occurring within the last 10 years: No If all of the above answers are "NO", then may proceed with Cephalosporin use.     MEDICATION LIST PRIOR TO VISIT: Current Meds  Medication Sig   apixaban (ELIQUIS) 5 MG TABS tablet Take 1 tablet (5 mg total) by mouth 2 (two) times daily.   aspirin EC 81 MG tablet Take 1 tablet (81 mg total) by mouth daily. Swallow whole.   escitalopram (LEXAPRO) 20 MG tablet Take 1 tablet (20 mg total) by mouth daily.   folic acid (FOLVITE) 1 MG tablet Take 1 tablet (1 mg total) by mouth daily.   metoprolol tartrate (LOPRESSOR) 25 MG tablet Take 0.5 tablets (12.5 mg total) by mouth 2 (two) times daily.   Multiple Vitamin (MULTIVITAMIN WITH MINERALS) TABS tablet Take 1 tablet by mouth daily.   Nystatin (GERHARDT'S BUTT CREAM) CREA Apply 1 Application topically 3 (three) times daily.   OLANZapine (ZYPREXA) 7.5 MG tablet TAKE ONE TABLET BY MOUTH DAILY AT BEDTIME   pantoprazole (PROTONIX) 40 MG tablet Take 1 tablet (40 mg total) by mouth daily.   rosuvastatin (CRESTOR) 20 MG tablet Take 1 tablet (20 mg total) by mouth daily.   thiamine 100 MG tablet Take 1 tablet (100 mg total) by mouth daily.     PAST MEDICAL HISTORY: Past Medical History:  Diagnosis Date   Anxiety    Breast cancer ([place]) fall  1997    Left Lumpectomy, XRT.  treated with Tamoxifem and Evista   Depression    Endometriosis in her 20's   surgery to help clear endometriosis to obtain a pregnancy.   GERD (gastroesophageal reflux disease)    w/"stretching" remotely   HTN (hypertension)    Migraines     PAST SURGICAL HISTORY: Past Surgical History:  Procedure Laterality Date   BREAST LUMPECTOMY Left fall 1997   Lymph  nodes X 3 were negative, ER+, Radiation treatmetn.  Took Tamoxifem X 5 yrs then Evista for 3-5 yrs.   CESAREAN SECTION     x2  first pregnancy breach and second normal   PATENT FORAMEN OVALE(PFO) CLOSURE N/A 07/12/2021   Procedure: PATENT FORAMEN OVALE(PFO) CLOSURE;  Surgeon: Adrian Prows, MD;  Location: [place] [facility];  Service: Cardiovascular;  Laterality: N/A;   RIGHT HEART CATH N/A 07/12/2021   Procedure: RIGHT HEART CATH;  Surgeon: Adrian Prows, MD;  Location: [place] [facility];  Service: Cardiovascular;  Laterality: N/A;   surgery for endometriosis      FAMILY HISTORY: The patient family history includes Heart attack in her father; Heart disease in her father; Hypertension in her mother; Stroke in her mother; Throat cancer in an other family member.  SOCIAL HISTORY:  The patient  reports that she quit smoking about 33 years ago. Her smoking use included cigarettes. She has a 12.50 pack-year smoking history. She has never used smokeless tobacco. She reports that she does not currently use alcohol. She reports that she does not use drugs.  REVIEW OF SYSTEMS: Review of Systems  Cardiovascular:  Negative for chest pain, claudication, dyspnea on exertion, irregular heartbeat, leg swelling, near-syncope, orthopnea, palpitations, paroxysmal nocturnal dyspnea and syncope.  Respiratory:  Negative for shortness of breath.   Hematologic/Lymphatic: Negative for bleeding problem.  Musculoskeletal:  Negative for muscle cramps and myalgias.  Neurological:  Negative for dizziness and light-headedness.   PHYSICAL EXAM:    08/28/2021    3:47 PM 08/22/2021   11:30 AM 08/22/2021   11:15 AM  Vitals with BMI  Height '5\' 3"'$     Weight 111 lbs 10 oz    BMI 56.31    Systolic 497    Diastolic 63    Pulse 71 82 83    CONSTITUTIONAL: Appears older than stated age, hemodynamically stable, no acute distress, presently in a wheelchair SKIN: Skin is warm and dry. No rash noted. No cyanosis. No pallor. No  jaundice HEAD: Normocephalic and atraumatic.  EYES: No scleral icterus MOUTH/THROAT: Moist oral membranes.  NECK: No JVD present. No thyromegaly noted. No carotid bruits  CHEST Normal respiratory effort. No intercostal retractions  LUNGS: Clear to auscultation bilaterally.  No stridor. No wheezes. No rales.  CARDIOVASCULAR: Regular rate and rhythm, positive S1-S2, no murmurs rubs or gallops appreciated. ABDOMINAL: Soft, nontender, nondistended, positive bowel sounds in all 4 quadrants, no apparent ascites.  EXTREMITIES: No pitting edema in the left lower extremity.  Right lower extremity wrapped in a boot HEMATOLOGIC: No significant bruising NEUROLOGIC: Oriented to person, place, and time. Nonfocal. Normal muscle tone.  PSYCHIATRIC: Normal mood and affect. Normal behavior. Cooperative. No significant change since last office visit  CARDIAC DATABASE: EKG: 08/28/2021: Normal sinus rhythm, 71 bpm, left axis, poor R wave progression, without underlying injury pattern.  Echocardiogram: 07/09/2021: LVEF 55-60%, no regional wall motion abnormalities, grade 1 diastolic impairment, right ventricular size and function normal, no mitral stenosis, aortic valve sclerosis without stenosis, estimated RAP 3 mmHg, positive  sonicated saline study suggestive of intra-atrial shunt.  08/21/2021: LVEF 60-65%, no regional wall motion abnormalities, mild LVH, normal diastolic function, small pericardial effusion,S/p 47m Amplatzer Talisman septal occluder well seated, no residual interatrial shunting, no obvious thrombus or vegetation on the device.  Estimated RAP 3 mmHg  Stress Testing: No results found for this or any previous visit from the past 1095 days.   Heart Catheterization: None  Cardiac MR 07/11/2021: 1.  Normal biventricular chamber size and function. 2.  Qp/Qs is 0.93. 3. No ASD seen. No definite anomalous pulmonary venous return. No findings to suggest congenital heart lesions or intracardiac  shunt  Procedure right heart cath and ASD repair 07/12/2021: Right heart catheterization and closure of the patent foramen ovale with implantation of a Amplatzer Talisman 25 mm ASO device. Strongly positive right to left shunting through PFO reduced to minimal shunting post closure and expect complete closure of the PFO once endothelialization occurs.   Normal right heart catheterization with preserved cardiac output and cardiac index.  Qp/Qs 1.0.  Recommendation: Aspirin indefinitely, Plavix for 30 days along with aspirin, endocarditis prophylaxis for 6 months.  I expect significant improvement in her dyspnea.  LABORATORY DATA:    Latest Ref Rng & Units 08/21/2021    1:34 PM 08/20/2021    1:21 PM 08/20/2021    1:20 PM  CBC  WBC 4.0 - 10.5 K/uL 9.7   10.2   Hemoglobin 12.0 - 15.0 g/dL 14.3  13.9  13.6   Hematocrit 36.0 - 46.0 % 42.1  41.0  39.4   Platelets 150 - 400 K/uL 324   348        Latest Ref Rng & Units 08/21/2021    1:34 PM 08/20/2021    1:21 PM 08/20/2021    1:20 PM  CMP  Glucose 70 - 99 mg/dL 92  98  98   BUN 8 - 23 mg/dL '5  7  8   '$ Creatinine 0.44 - 1.00 mg/dL 0.69  0.60  0.71   Sodium 135 - 145 mmol/L 135  138  136   Potassium 3.5 - 5.1 mmol/L 3.7  3.8  3.8   Chloride 98 - 111 mmol/L 107  106  108   CO2 22 - 32 mmol/L 21   19   Calcium 8.9 - 10.3 mg/dL 9.2   9.2   Total Protein 6.5 - 8.1 g/dL 7.1   6.8   Total Bilirubin 0.3 - 1.2 mg/dL 0.9   0.8   Alkaline Phos 38 - 126 U/L 63   67   AST 15 - 41 U/L 20   20   ALT 0 - 44 U/L 13   12     Lipid Panel     Component Value Date/Time   CHOL 168 08/21/2021 1334   TRIG 112 08/21/2021 1334   HDL 46 08/21/2021 1334   CHOLHDL 3.7 08/21/2021 1334   VLDL 22 08/21/2021 1334   LDLCALC 100 (H) 08/21/2021 1334    No components found for: "NTPROBNP" No results for input(s): "PROBNP" in the last 8760 hours. Recent Labs    07/08/21 1803  TSH 1.324    BMP Recent Labs    07/18/21 0541 08/20/21 1320 08/20/21 1321  08/21/21 1334  NA 138 136 138 135  K 3.9 3.8 3.8 3.7  CL 109 108 106 107  CO2 23 19*  --  21*  GLUCOSE 100* 98 98 92  BUN 6* 8 7* 5*  CREATININE 0.61 0.71 0.60 0.69  CALCIUM 8.7* 9.2  --  9.2  GFRNONAA >60 >60  --  >60    HEMOGLOBIN A1C Lab Results  Component Value Date   HGBA1C 4.7 (L) 08/20/2021   MPG 88.19 08/20/2021    IMPRESSION:    ICD-10-CM   1. History of stroke  Z86.73 EKG 12-Lead    2. Platypnea-orthodeoxia syndrome  R06.09     3. PFO closure with 25 mm Amplatzer Talisman septal occluder on 07/12/2021  Z87.74     4. Essential hypertension  I10        RECOMMENDATIONS: Monique Ballard is a 81 y.o. Caucasian female whose past medical history and cardiac risk factors include: Platypnea orthodeoxia syndrome, status post PFO closure, hypertension, left-sided breast cancer status post lumpectomy/radiation/tamoxifen, anxiety, depression, GERD, esophageal strictures, aortic atherosclerosis, ectasia of the ascending thoracic aorta 39 mm (CT scan 07/08/2021).  History of stroke Presented to the ED in July 2023 with expressive aphasia.  Treated with TNK. Per neurology symptoms consistent with left hemispheric stroke too small to be seen on MRI versus TIA possibly embolic given the recent PFO closure versus transient paroxysmal atrial fibrillation. Had completed 30 days of Plavix on August 11, 2021. Recommended 3 months of Eliquis 5 mg p.o. twice daily to decrease the chance of thromboembolic events as a device endothelializes. We discussed modalities for further evaluate for possible paroxysmal atrial fibrillation including but not limited to implantable loop recorder, smart devices that have been FDA approved, 2-week extended Holter monitor, and cardia mobile.  After discussing the risks, benefits, alternatives and limitations of each modality patient would like to proceed with loop recorder implantation.  Recommend holding Eliquis 24 hours prior to loop implantation to reduce  periprocedural bleeding.  Platypnea-orthodeoxia syndrome No longer requiring oxygen therapy after closure of PFO.   PFO closure with 25 mm Amplatzer Talisman septal occluder on 07/12/2021 Aspirin 81 mg p.o. daily indefinitely -as long as she remains low risk for bleeding. Endocarditis prophylaxis for the first 6 months. And given the recent CVA/TIA recommended 3 months of anticoagulation as a device endothelializes.    FINAL MEDICATION LIST END OF ENCOUNTER: No orders of the defined types were placed in this encounter.   There are no discontinued medications.   Current Outpatient Medications:    apixaban (ELIQUIS) 5 MG TABS tablet, Take 1 tablet (5 mg total) by mouth 2 (two) times daily., Disp: 60 tablet, Rfl: 1   aspirin EC 81 MG tablet, Take 1 tablet (81 mg total) by mouth daily. Swallow whole., Disp: 30 tablet, Rfl: 11   escitalopram (LEXAPRO) 20 MG tablet, Take 1 tablet (20 mg total) by mouth daily., Disp: 30 tablet, Rfl: 0   folic acid (FOLVITE) 1 MG tablet, Take 1 tablet (1 mg total) by mouth daily., Disp: 30 tablet, Rfl: 0   metoprolol tartrate (LOPRESSOR) 25 MG tablet, Take 0.5 tablets (12.5 mg total) by mouth 2 (two) times daily., Disp: 30 tablet, Rfl: 0   Multiple Vitamin (MULTIVITAMIN WITH MINERALS) TABS tablet, Take 1 tablet by mouth daily., Disp: , Rfl:    Nystatin (GERHARDT'S BUTT CREAM) CREA, Apply 1 Application topically 3 (three) times daily., Disp: , Rfl:    OLANZapine (ZYPREXA) 7.5 MG tablet, TAKE ONE TABLET BY MOUTH DAILY AT BEDTIME, Disp: 90 tablet, Rfl: 0   pantoprazole (PROTONIX) 40 MG tablet, Take 1 tablet (40 mg total) by mouth daily., Disp: 30 tablet, Rfl: 0   rosuvastatin (CRESTOR) 20 MG tablet, Take 1 tablet (20 mg total) by mouth daily.,  Disp: 30 tablet, Rfl: 1   thiamine 100 MG tablet, Take 1 tablet (100 mg total) by mouth daily., Disp: 30 tablet, Rfl: 0  Orders Placed This Encounter  Procedures   EKG 12-Lead    There are no Patient Instructions on file  for this visit.   --Continue cardiac medications as reconciled in final medication list. --Return in about 4 weeks (around 09/25/2021) for Follow up s/p loop recorder. . or sooner if needed. --Continue follow-up with your primary care physician regarding the management of your other chronic comorbid conditions.  Patient's questions and concerns were addressed to her satisfaction. She voices understanding of the instructions provided during this encounter.   This note was created using a voice recognition software as a result there may be grammatical errors inadvertently enclosed that do not reflect the nature of this encounter. Every attempt is made to correct such errors.  Rex Kras, [place], [facility]  Pager: [phone] Office: [phone]

## 2021-08-29 MED ORDER — ESCITALOPRAM OXALATE 20 MG PO TABS: 20.0000 mg | ORAL_TABLET | Freq: Every day | ORAL | 1 refills | Status: DC

## 2021-09-02 ENCOUNTER — Encounter: Payer: Self-pay | Admitting: Internal Medicine

## 2021-09-14 ENCOUNTER — Other Ambulatory Visit: Payer: Self-pay | Admitting: Internal Medicine

## 2021-09-14 DIAGNOSIS — K222 Esophageal obstruction: Secondary | ICD-10-CM | POA: Diagnosis not present

## 2021-09-14 DIAGNOSIS — S8261XA Displaced fracture of lateral malleolus of right fibula, initial encounter for closed fracture: Secondary | ICD-10-CM | POA: Diagnosis not present

## 2021-09-14 MED ORDER — FOLIC ACID 1 MG PO TABS: 1.0000 mg | ORAL_TABLET | Freq: Every day | ORAL | 3 refills | Status: DC

## 2021-09-14 MED ORDER — METOPROLOL TARTRATE 25 MG PO TABS: 12.5000 mg | ORAL_TABLET | Freq: Two times a day (BID) | ORAL | 1 refills | Status: DC

## 2021-09-17 ENCOUNTER — Encounter: Payer: Self-pay | Admitting: Physical Therapy

## 2021-09-17 ENCOUNTER — Encounter: Payer: Self-pay | Admitting: Internal Medicine

## 2021-09-17 ENCOUNTER — Ambulatory Visit: Payer: Medicare Other | Attending: Neurology | Admitting: Physical Therapy

## 2021-09-17 DIAGNOSIS — M6281 Muscle weakness (generalized): Secondary | ICD-10-CM | POA: Insufficient documentation

## 2021-09-17 DIAGNOSIS — R2681 Unsteadiness on feet: Secondary | ICD-10-CM | POA: Diagnosis not present

## 2021-09-17 DIAGNOSIS — R278 Other lack of coordination: Secondary | ICD-10-CM | POA: Insufficient documentation

## 2021-09-17 DIAGNOSIS — R262 Difficulty in walking, not elsewhere classified: Secondary | ICD-10-CM | POA: Insufficient documentation

## 2021-09-17 DIAGNOSIS — I639 Cerebral infarction, unspecified: Secondary | ICD-10-CM | POA: Diagnosis not present

## 2021-09-17 DIAGNOSIS — R293 Abnormal posture: Secondary | ICD-10-CM | POA: Insufficient documentation

## 2021-09-17 NOTE — Therapy (Signed)
OUTPATIENT PHYSICAL THERAPY NEURO EVALUATION   Patient Name: Monique Ballard MRN: [id number] DOB:1940/04/10, 81 y.o., female Today's Date: 09/17/2021   PCP: [place], [place] PROVIDER: Garvin Fila, MD    PT End of Session - 09/17/21 1904     Visit Number 1    Date for PT Re-Evaluation 11/26/21    PT Start Time 1802    PT Stop Time 1842    PT Time Calculation (min) 40 min    Equipment Utilized During Treatment Gait belt    Activity Tolerance Patient tolerated treatment well    Behavior During Therapy WFL for tasks assessed/performed             Past Medical History:  Diagnosis Date   Anxiety    Breast cancer ([place]) fall 1997    Left Lumpectomy, XRT.  treated with Tamoxifem and Evista   Depression    Endometriosis in her 20's   surgery to help clear endometriosis to obtain a pregnancy.   GERD (gastroesophageal reflux disease)    w/"stretching" remotely   HTN (hypertension)    Migraines    Past Surgical History:  Procedure Laterality Date   BREAST LUMPECTOMY Left fall 1997   Lymph nodes X 3 were negative, ER+, Radiation treatmetn.  Took Tamoxifem X 5 yrs then Evista for 3-5 yrs.   CESAREAN SECTION     x2  first pregnancy breach and second normal   PATENT FORAMEN OVALE(PFO) CLOSURE N/A 07/12/2021   Procedure: PATENT FORAMEN OVALE(PFO) CLOSURE;  Surgeon: Adrian Prows, MD;  Location: [place] [facility];  Service: Cardiovascular;  Laterality: N/A;   RIGHT HEART CATH N/A 07/12/2021   Procedure: RIGHT HEART CATH;  Surgeon: Adrian Prows, MD;  Location: [place] [facility];  Service: Cardiovascular;  Laterality: N/A;   surgery for endometriosis     Patient Active Problem List   Diagnosis Date Noted   Acute ischemic stroke ([place]) 08/20/2021   Closed fracture of lateral malleolus of right fibula 08/07/2021   Debility 07/15/2021   Platypnea-orthodeoxia syndrome    Polycythemia vera, acquired ([place])    Hypoxia 07/08/2021   Nocturnal hypoxia 07/26/2020   Alcohol  dependency ([place]) 05/15/2018   Tremor 02/24/2015   Gait disorder 02/24/2015   PCP NOTES >>>>>>> 12/13/2014   Osteoporosis 10/04/2011   Annual physical exam 09/07/2010   GERD 08/28/2009   Anxiety and depression 10/06/2006   Essential hypertension 10/06/2006   BREAST CANCER, HX OF 10/06/2006    ONSET DATE: 08/21/2021   REFERRING DIAG: Diagnosis I63.9 (ICD-10-CM) - Acute ischemic stroke (HCC)   THERAPY DIAG:  Unsteadiness on feet  Muscle weakness (generalized)  Other lack of coordination  Difficulty in walking, not elsewhere classified  Abnormal posture  Rationale for Evaluation and Treatment Rehabilitation  SUBJECTIVE:  SUBJECTIVE STATEMENT: Patient reports she broke her L lateral malleolus, but it is largely healed. She was recently treated for PFO closure to treat persistent hypoxia. She was then  hospitalized after a CVA. She had 2 weeks of inpatient rehab. L sided weakness. Pt accompanied by: family member  PERTINENT HISTORY: HISTORY OF PRESENT ILLNESS Monique Ballard is a 81 y.o. female with history of HTN, dementia, BRCA, migraines, alcohol dependence, anxiety and recent PFO closure to treat persistent hypoxia presenting with  acute onset expressive aphasia.  She was brought to the hospital and given TNK to treat her stroke.  Her symptoms have improved.  Patient reportedly had a similar episode of expressive aphasia about a week ago that resolved spontaneously.  She had a procedure to close a PFO in June of this year.  MRI reveals no acute abnormality, so stroke is likely to small to be seen. She has been started on 2 months of Eliquis with ASA 24m and is to follow up with cardiology outpatient as well.   PAIN:  Are you having pain? No  PRECAUTIONS: None  WEIGHT BEARING  RESTRICTIONS No  FALLS: Has patient fallen in last 6 months? Yes. Number of falls 2  LIVING ENVIRONMENT: Lives with: lives with their spouse, lives with their son, and 7 hours of personal care attendant M-F Lives in: House/apartment Stairs: Yes: External: 2 steps; can reach both Has following equipment at home: Quad cane small base, Walker - 2 wheeled, WEnvironmental consultant- 4 wheeled, Shower bench, and bed side commode  PLOF: Needs assistance with ADLs and Needs assistance with gait  PATIENT GOALS Be able to walk without the walker.  OBJECTIVE:   DIAGNOSTIC FINDINGS: DISCHARGE DIAGNOSIS: left hemispheric stroke too small to be seen on MRI versus TIA possibly embolic from recent PFO closure -device thrombosis versus transient paroxysmal A-fib s/p Iv TNk with full recovery Principal Problem:   Acute ischemic stroke ([facility] Platypnoea orthodeoxia syndrome s/p PFO closure  COGNITION: Overall cognitive status: Within functional limits for tasks assessed   SENSATION: WFL  COORDINATION: WFL U and LE  EDEMA:  None noted  MUSCLE TONE: RLE: Within functional limits   MUSCLE LENGTH: Hamstrings: Right 72 deg; Left 81 deg  POSTURE: rounded shoulders and forward head  LOWER EXTREMITY ROM:    WFL BLE, including B ankles   LOWER EXTREMITY MMT:    MMT Right Eval Left Eval  Hip flexion 4 4  Hip extension 4 4  Hip abduction 4 4  Hip adduction    Hip internal rotation    Hip external rotation    Knee flexion 4 4  Knee extension 4 4  Ankle dorsiflexion 4 2  Ankle plantarflexion  2+  Ankle inversion    Ankle eversion    (Blank rows = not tested)  BED MOBILITY:  I  TRANSFERS: Assistive device utilized: WEnvironmental consultant- 2 wheeled  Sit to stand: SBA Stand to sit: SBA Chair to chair: SBA  CURB:  Level of Assistance: Min A Assistive device utilized: WEnvironmental consultant- 2 wheeled  STAIRS:  Level of Assistance: CGA  Stair Negotiation Technique: Step to Pattern with Bilateral Rails  Number of Stairs:  2    GAIT: Gait pattern: step through pattern, decreased ankle dorsiflexion- Left, Left steppage, Left foot flat, and poor foot clearance- Left Distance walked: 80 Assistive device utilized: Walker - 2 wheeled Level of assistance: CGA Comments: Patient mildly unsteady, especially when turning.  FUNCTIONAL TESTs:  5 times sit to stand: 18.10  Timed up and go (TUG): 19.57 Functional gait assessment: TBD  PATIENT SURVEYS:  FOTO 57.59  TODAY'S TREATMENT:  Education   PATIENT EDUCATION: Education details: POC Person educated: Patient and Child(ren) Education method: Explanation Education comprehension: verbalized understanding   HOME EXERCISE PROGRAM: TBD    GOALS: Goals reviewed with patient? Yes  SHORT TERM GOALS: Target date: 10/15/2021  I with basic HEP Baseline: Goal status: INITIAL  2.  Determine whether to pursue AFO for L foot drop Baseline:  Goal status: INITIAL  LONG TERM GOALS: Target date: 11/26/2021  I with final HEP Baseline:  Goal status: INITIAL  2.  Patient will perform all functional transfers MI Baseline: CGA Goal status: INITIAL  3.  Patient will perform 5 x STS in < 12 sec to demonstrate improved functional strength and control Baseline: 18.10 Goal status: INITIAL  4.  Perform TUG in < 12 sec to demonstrate improved balance/decreased fall risk. Baseline: 19.57 Goal status: INITIAL  5.  Patient will ambulate x at least 500' functionally on indoor surfaces with LRAD, any necessary bracing for L ankle, MI Baseline: CGA, RW, foot drop, unsteady. Goal status: INITIAL  6. Increase FOTO to at least 68  Baseline 58  Goal Status INITIAL   ASSESSMENT:  CLINICAL IMPRESSION: Patient is a 81 y.o. who was seen today for physical therapy evaluation and treatment for CVA. Patient is accompanied by her daughter, [name], who is a PT. She demonstrates good MMT strength in BLE, except L ankle. Her NM control is diminished as is her balance and  safety with functional mobility. She demonstrates increased fall risk due to her deficits. She will benefit from PT for AFO assessment, NM re-education, balance, and functional mobility re-education to decrease fall risk and maximize safety and I with all functional mobility.    OBJECTIVE IMPAIRMENTS Abnormal gait, decreased activity tolerance, decreased balance, decreased mobility, difficulty walking, decreased strength, improper body mechanics, and postural dysfunction.   ACTIVITY LIMITATIONS carrying, lifting, bending, standing, squatting, stairs, transfers, bathing, toileting, and locomotion level  PARTICIPATION LIMITATIONS: meal prep, cleaning, driving, and shopping  PERSONAL FACTORS Age and Behavior pattern are also affecting patient's functional outcome.   REHAB POTENTIAL: Good  CLINICAL DECISION MAKING: Stable/uncomplicated  EVALUATION COMPLEXITY: Moderate  PLAN: PT FREQUENCY: 2x/week  PT DURATION: 10 weeks  PLANNED INTERVENTIONS: Therapeutic exercises, Therapeutic activity, Neuromuscular re-education, Balance training, Gait training, Patient/Family education, Self Care, Joint mobilization, Stair training, and Manual therapy  PLAN FOR NEXT SESSION: FGA, HEP, balance   Marcelina Morel, DPT 09/17/2021, 7:05 PM

## 2021-09-19 ENCOUNTER — Ambulatory Visit: Payer: Medicare Other | Admitting: Physical Therapy

## 2021-09-19 ENCOUNTER — Encounter: Payer: Self-pay | Admitting: Physical Therapy

## 2021-09-19 DIAGNOSIS — R2681 Unsteadiness on feet: Secondary | ICD-10-CM

## 2021-09-19 DIAGNOSIS — I639 Cerebral infarction, unspecified: Secondary | ICD-10-CM | POA: Diagnosis not present

## 2021-09-19 DIAGNOSIS — R262 Difficulty in walking, not elsewhere classified: Secondary | ICD-10-CM | POA: Diagnosis not present

## 2021-09-19 DIAGNOSIS — R278 Other lack of coordination: Secondary | ICD-10-CM | POA: Diagnosis not present

## 2021-09-19 DIAGNOSIS — R293 Abnormal posture: Secondary | ICD-10-CM

## 2021-09-19 DIAGNOSIS — M6281 Muscle weakness (generalized): Secondary | ICD-10-CM

## 2021-09-19 NOTE — Therapy (Signed)
OUTPATIENT PHYSICAL THERAPY TREATMENT   Patient Name: Monique Ballard MRN: [id number] DOB:06-Jul-1940, 81 y.o., female Today's Date: 09/19/2021   PCP: [facility] REFERRING PROVIDER: Garvin Fila, MD    PT End of Session - 09/19/21 1235     Visit Number 2    Date for PT Re-Evaluation 11/26/21    PT Start Time 1231    PT Stop Time 1310    PT Time Calculation (min) 39 min    Equipment Utilized During Treatment Gait belt    Activity Tolerance Patient tolerated treatment well    Behavior During Therapy WFL for tasks assessed/performed              Past Medical History:  Diagnosis Date   Anxiety    Breast cancer ([place]) fall 1997    Left Lumpectomy, XRT.  treated with Tamoxifem and Evista   Depression    Endometriosis in her 20's   surgery to help clear endometriosis to obtain a pregnancy.   GERD (gastroesophageal reflux disease)    w/"stretching" remotely   HTN (hypertension)    Migraines    Past Surgical History:  Procedure Laterality Date   BREAST LUMPECTOMY Left fall 1997   Lymph nodes X 3 were negative, ER+, Radiation treatmetn.  Took Tamoxifem X 5 yrs then Evista for 3-5 yrs.   CESAREAN SECTION     x2  first pregnancy breach and second normal   PATENT FORAMEN OVALE(PFO) CLOSURE N/A 07/12/2021   Procedure: PATENT FORAMEN OVALE(PFO) CLOSURE;  Surgeon: Adrian Prows, MD;  Location: [facility];  Service: Cardiovascular;  Laterality: N/A;   RIGHT HEART CATH N/A 07/12/2021   Procedure: RIGHT HEART CATH;  Surgeon: Adrian Prows, MD;  Location: [facility];  Service: Cardiovascular;  Laterality: N/A;   surgery for endometriosis     Patient Active Problem List   Diagnosis Date Noted   Acute ischemic stroke ([place]) 08/20/2021   Closed fracture of lateral malleolus of right fibula 08/07/2021   Debility 07/15/2021   Platypnea-orthodeoxia syndrome    Polycythemia vera, acquired ([place])    Hypoxia 07/08/2021   Nocturnal hypoxia 07/26/2020   Alcohol dependency  ([place]) 05/15/2018   Tremor 02/24/2015   Gait disorder 02/24/2015   PCP NOTES >>>>>>> 12/13/2014   Osteoporosis 10/04/2011   Annual physical exam 09/07/2010   GERD 08/28/2009   Anxiety and depression 10/06/2006   Essential hypertension 10/06/2006   BREAST CANCER, HX OF 10/06/2006    ONSET DATE: 08/21/2021   REFERRING DIAG: Diagnosis I63.9 (ICD-10-CM) - Acute ischemic stroke (HCC)   THERAPY DIAG:  Unsteadiness on feet  Muscle weakness (generalized)  Other lack of coordination  Difficulty in walking, not elsewhere classified  Abnormal posture  Rationale for Evaluation and Treatment Rehabilitation  SUBJECTIVE:  SUBJECTIVE STATEMENT: Patient reports no new issues today, no falls  PERTINENT HISTORY: HISTORY OF PRESENT ILLNESS Ms. Monique Ballard is a 81 y.o. female with history of HTN, dementia, BRCA, migraines, alcohol dependence, anxiety and recent PFO closure to treat persistent hypoxia presenting with  acute onset expressive aphasia.  She was brought to the hospital and given TNK to treat her stroke.  Her symptoms have improved.  Patient reportedly had a similar episode of expressive aphasia about a week ago that resolved spontaneously.  She had a procedure to close a PFO in June of this year.  MRI reveals no acute abnormality, so stroke is likely to small to be seen. She has been started on 2 months of Eliquis with ASA 21m and is to follow up with cardiology outpatient as well.   PAIN:  Are you having pain? No  PRECAUTIONS: None  WEIGHT BEARING RESTRICTIONS No  FALLS: Has patient fallen in last 6 months? Yes. Number of falls 2  LIVING ENVIRONMENT: Lives with: lives with their spouse, lives with their son, and 7 hours of personal care attendant M-F Lives in: House/apartment Stairs:  Yes: External: 2 steps; can reach both Has following equipment at home: Quad cane small base, Walker - 2 wheeled, WEnvironmental consultant- 4 wheeled, Shower bench, and bed side commode  PLOF: Needs assistance with ADLs and Needs assistance with gait  PATIENT GOALS Be able to walk without the walker.  OBJECTIVE:   DIAGNOSTIC FINDINGS: DISCHARGE DIAGNOSIS: left hemispheric stroke too small to be seen on MRI versus TIA possibly embolic from recent PFO closure -device thrombosis versus transient paroxysmal A-fib s/p Iv TNk with full recovery Principal Problem:   Acute ischemic stroke ([place] Platypnoea orthodeoxia syndrome s/p PFO closure  COGNITION: Overall cognitive status: Within functional limits for tasks assessed   SENSATION: WFL  COORDINATION: WFL U and LE  EDEMA:  None noted  MUSCLE TONE: RLE: Within functional limits   MUSCLE LENGTH: Hamstrings: Right 72 deg; Left 81 deg  POSTURE: rounded shoulders and forward head  LOWER EXTREMITY ROM:    WFL BLE, including B ankles   LOWER EXTREMITY MMT:    MMT Right Eval Left Eval  Hip flexion 4 4  Hip extension 4 4  Hip abduction 4 4  Hip adduction    Hip internal rotation    Hip external rotation    Knee flexion 4 4  Knee extension 4 4  Ankle dorsiflexion 4 2  Ankle plantarflexion  2+  Ankle inversion    Ankle eversion    (Blank rows = not tested)  BED MOBILITY:  I  TRANSFERS: Assistive device utilized: WEnvironmental consultant- 2 wheeled  Sit to stand: SBA Stand to sit: SBA Chair to chair: SBA  CURB:  Level of Assistance: Min A Assistive device utilized: WEnvironmental consultant- 2 wheeled  STAIRS:  Level of Assistance: CGA  Stair Negotiation Technique: Step to Pattern with Bilateral Rails  Number of Stairs: 2    GAIT: Gait pattern: step through pattern, decreased ankle dorsiflexion- Left, Left steppage, Left foot flat, and poor foot clearance- Left Distance walked: 80 Assistive device utilized: Walker - 2 wheeled Level of assistance:  CGA Comments: Patient mildly unsteady, especially when turning.  FUNCTIONAL TESTs:  5 times sit to stand: 18.10 Timed up and go (TUG): 19.57 Functional gait assessment: TBD  PATIENT SURVEYS:  FOTO 57.59  TODAY'S TREATMENT:  09/19/21 NuStep L5 x 5 minutes FGA-19/27 Side to side step with UE support, sit to stand wihtout UE  support gait with DF A with RW, CGA, without AD, stumbled once, required mod A to recover, 100' Seated hand walk outs.   09/17/21 Education   PATIENT EDUCATION: Education details: POC Person educated: Patient and Child(ren) Education method: Explanation Education comprehension: verbalized understanding   HOME EXERCISE PROGRAM:  [id number]  GOALS: Goals reviewed with patient? Yes  SHORT TERM GOALS: Target date: 10/15/2021  I with basic HEP Baseline: Goal status: ongoing  2.  Determine whether to pursue AFO for L foot drop Baseline:  Goal status: INITIAL  LONG TERM GOALS: Target date: 11/26/2021  I with final HEP Baseline:  Goal status: INITIAL  2.  Patient will perform all functional transfers MI Baseline: CGA Goal status: INITIAL  3.  Patient will perform 5 x STS in < 12 sec to demonstrate improved functional strength and control Baseline: 18.10 Goal status: INITIAL  4.  Perform TUG in < 12 sec to demonstrate improved balance/decreased fall risk. Baseline: 19.57 Goal status: INITIAL  5.  Patient will ambulate x at least 500' functionally on indoor surfaces with LRAD, any necessary bracing for L ankle, MI Baseline: CGA, RW, foot drop, unsteady. Goal status: INITIAL  6. Increase FOTO to at least 68  Baseline 58  Goal Status INITIAL   ASSESSMENT:  CLINICAL IMPRESSION: Iniitated HEP and initiated assessment for AFO.   OBJECTIVE IMPAIRMENTS Abnormal gait, decreased activity tolerance, decreased balance, decreased mobility, difficulty walking, decreased strength, improper body mechanics, and postural dysfunction.   ACTIVITY  LIMITATIONS carrying, lifting, bending, standing, squatting, stairs, transfers, bathing, toileting, and locomotion level  PARTICIPATION LIMITATIONS: meal prep, cleaning, driving, and shopping  PERSONAL FACTORS Age and Behavior pattern are also affecting patient's functional outcome.   REHAB POTENTIAL: Good  CLINICAL DECISION MAKING: Stable/uncomplicated  EVALUATION COMPLEXITY: Moderate  PLAN: PT FREQUENCY: 2x/week  PT DURATION: 10 weeks  PLANNED INTERVENTIONS: Therapeutic exercises, Therapeutic activity, Neuromuscular re-education, Balance training, Gait training, Patient/Family education, Self Care, Joint mobilization, Stair training, and Manual therapy  PLAN FOR NEXT SESSION: FGA, HEP, balance   Marcelina Morel, DPT 09/19/2021, 1:16 PM

## 2021-09-21 ENCOUNTER — Ambulatory Visit: Payer: No Typology Code available for payment source | Admitting: Cardiology

## 2021-09-25 ENCOUNTER — Telehealth: Payer: Self-pay

## 2021-09-25 NOTE — Therapy (Signed)
OUTPATIENT PHYSICAL THERAPY TREATMENT NOTE   Patient Name: Monique Ballard MRN: [id number] DOB:10-30-1940, 81 y.o., female Today's Date: 09/26/2021    END OF SESSION:   PT End of Session - 09/26/21 1117     Visit Number 3    Date for PT Re-Evaluation 11/26/21    PT Start Time 1106    PT Stop Time 1145    PT Time Calculation (min) 39 min    Equipment Utilized During Treatment Gait belt    Activity Tolerance Patient tolerated treatment well    Behavior During Therapy WFL for tasks assessed/performed             Past Medical History:  Diagnosis Date   Anxiety    Breast cancer ([place]) fall 1997    Left Lumpectomy, XRT.  treated with Tamoxifem and Evista   Depression    Endometriosis in her 20's   surgery to help clear endometriosis to obtain a pregnancy.   GERD (gastroesophageal reflux disease)    w/"stretching" remotely   HTN (hypertension)    Migraines    Past Surgical History:  Procedure Laterality Date   BREAST LUMPECTOMY Left fall 1997   Lymph nodes X 3 were negative, ER+, Radiation treatmetn.  Took Tamoxifem X 5 yrs then Evista for 3-5 yrs.   CESAREAN SECTION     x2  first pregnancy breach and second normal   PATENT FORAMEN OVALE(PFO) CLOSURE N/A 07/12/2021   Procedure: PATENT FORAMEN OVALE(PFO) CLOSURE;  Surgeon: Monique Prows, MD;  Location: [facility];  Service: Cardiovascular;  Laterality: N/A;   RIGHT HEART CATH N/A 07/12/2021   Procedure: RIGHT HEART CATH;  Surgeon: Monique Prows, MD;  Location: [facility];  Service: Cardiovascular;  Laterality: N/A;   surgery for endometriosis     Patient Active Problem List   Diagnosis Date Noted   Acute ischemic stroke ([place]) 08/20/2021   Closed fracture of lateral malleolus of right fibula 08/07/2021   Debility 07/15/2021   Platypnea-orthodeoxia syndrome    Polycythemia vera, acquired ([place])    Hypoxia 07/08/2021   Nocturnal hypoxia 07/26/2020   Alcohol dependency ([place]) 05/15/2018   Tremor 02/24/2015    Gait disorder 02/24/2015   PCP NOTES >>>>>>> 12/13/2014   Osteoporosis 10/04/2011   Annual physical exam 09/07/2010   GERD 08/28/2009   Anxiety and depression 10/06/2006   Essential hypertension 10/06/2006   BREAST CANCER, HX OF 10/06/2006    ONSET DATE: 08/21/2021    REFERRING DIAG: Diagnosis I63.9 (ICD-10-CM) - Acute ischemic stroke (HCC)    THERAPY DIAG:  Unsteadiness on feet   Muscle weakness (generalized)   Other lack of coordination   Difficulty in walking, not elsewhere classified   Abnormal posture   Rationale for Evaluation and Treatment Rehabilitation   SUBJECTIVE:  SUBJECTIVE STATEMENT: Patient reports no new issues today. She is working on her HEP.   PERTINENT HISTORY: HISTORY OF PRESENT ILLNESS Ms. Monique Ballard is a 81 y.o. female with history of HTN, dementia, BRCA, migraines, alcohol dependence, anxiety and recent PFO closure to treat persistent hypoxia presenting with  acute onset expressive aphasia.  She was brought to the hospital and given TNK to treat her stroke.  Her symptoms have improved.  Patient reportedly had a similar episode of expressive aphasia about a week ago that resolved spontaneously.  She had a procedure to close a PFO in June of this year.  MRI reveals no acute abnormality, so stroke is likely to small to be seen. She has been started on 2 months of Eliquis with ASA 3m and is to follow up with cardiology outpatient as well.    PAIN:  Are you having pain? No   PRECAUTIONS: None   WEIGHT BEARING RESTRICTIONS No   FALLS: Has patient fallen in last 6 months? Yes. Number of falls 2   LIVING ENVIRONMENT: Lives with: lives with their spouse, lives with their son, and 7 hours of personal care attendant M-F Lives in: House/apartment Stairs: Yes:  External: 2 steps; can reach both Has following equipment at home: Quad cane small base, Walker - 2 wheeled, WEnvironmental consultant- 4 wheeled, Shower bench, and bed side commode   PLOF: Needs assistance with ADLs and Needs assistance with gait   PATIENT GOALS Be able to walk without the walker.   OBJECTIVE:    DIAGNOSTIC FINDINGS: DISCHARGE DIAGNOSIS: left hemispheric stroke too small to be seen on MRI versus TIA possibly embolic from recent PFO closure -device thrombosis versus transient paroxysmal A-fib s/p Iv TNk with full recovery Principal Problem:   Acute ischemic stroke (HCC) Platypnoea orthodeoxia syndrome s/p PFO closure   COGNITION: Overall cognitive status: Within functional limits for tasks assessed             SENSATION: WFL   COORDINATION: WFL U and LE   EDEMA:  None noted   MUSCLE TONE: RLE: Within functional limits     MUSCLE LENGTH: Hamstrings: Right 72 deg; Left 81 deg   POSTURE: rounded shoulders and forward head   LOWER EXTREMITY ROM:    WFL BLE, including B ankles     LOWER EXTREMITY MMT:     MMT Right Eval Left Eval  Hip flexion 4 4  Hip extension 4 4  Hip abduction 4 4  Hip adduction      Hip internal rotation      Hip external rotation      Knee flexion 4 4  Knee extension 4 4  Ankle dorsiflexion 4 2  Ankle plantarflexion   2+  Ankle inversion      Ankle eversion      (Blank rows = not tested)   BED MOBILITY:  I   TRANSFERS: Assistive device utilized: WEnvironmental consultant- 2 wheeled  Sit to stand: SBA Stand to sit: SBA Chair to chair: SBA   CURB:  Level of Assistance: Min A Assistive device utilized: WEnvironmental consultant- 2 wheeled   STAIRS:           Level of Assistance: CGA           Stair Negotiation Technique: Step to Pattern with Bilateral Rails           Number of Stairs: 2              GAIT: Gait pattern: step  through pattern, decreased ankle dorsiflexion- Left, Left steppage, Left foot flat, and poor foot clearance- Left Distance walked:  80 Assistive device utilized: Walker - 2 wheeled Level of assistance: CGA Comments: Patient mildly unsteady, especially when turning.   FUNCTIONAL TESTs:  5 times sit to stand: 18.10 Timed up and go (TUG): 19.57 Functional gait assessment: TBD   PATIENT SURVEYS:  FOTO 57.59   TODAY'S TREATMENT:  09/26/21 Tandem stance 2x30 sec each, 1 finger support NBOS x30 sec on foam NBOS on foam, trunk rotation holding weighted yellow ball 2x5 reps, CGA Sit to stand without UE 2x5 reps, PT cues to scoot forward in chair before standing Side stepping in // bars 2 HHA and 1 HHA x2 rounds Standing hip abduction 2# ankle weights, 2x10 reps  Standing hip flexion 2#, 2x10 reps  Gait training: using SPC x50' CGA and PT cuing to contact SPC with LLE; using SBQC x50' CGA Nustep L5x3 min  09/19/21 NuStep L5 x 5 minutes FGA-19/27 Side to side step with UE support, sit to stand wihtout UE support gait with DF A with RW, CGA, without AD, stumbled once, required mod A to recover, 100' Seated hand walk outs.     09/17/21 Education     PATIENT EDUCATION: Education details: exercise technique Person educated: Patient and Child(ren) Education method: Explanation Education comprehension: verbalized understanding     HOME EXERCISE PROGRAM:  [id number]   GOALS: Goals reviewed with patient? Yes   SHORT TERM GOALS: Target date: 10/15/2021   I with basic HEP Baseline: Goal status: ongoing   2.  Determine whether to pursue AFO for L foot drop Baseline: pt seems content without one Goal status: ONGOING   LONG TERM GOALS: Target date: 11/26/2021   I with final HEP Baseline:  Goal status: INITIAL   2.  Patient will perform all functional transfers MI Baseline: CGA Goal status: INITIAL   3.  Patient will perform 5 x STS in < 12 sec to demonstrate improved functional strength and control Baseline: 18.10 Goal status: INITIAL   4.  Perform TUG in < 12 sec to demonstrate improved balance/decreased  fall risk. Baseline: 19.57 Goal status: INITIAL   5.  Patient will ambulate x at least 500' functionally on indoor surfaces with LRAD, any necessary bracing for L ankle, MI Baseline: CGA, RW, foot drop, unsteady. Goal status: INITIAL   6. Increase FOTO to at least 68            Baseline 58            Goal Status INITIAL     ASSESSMENT:   CLINICAL IMPRESSION: Pt has been working on her HEP without issues. Session continued with focus on strengthening and balance activity to improve her steadiness with gait and other static standing activity. Pt would like to stop using her RW at some point, so PT assessed her ability to ambulate with a SPC. Pt required CGA and moderate cuing for AD placement and safety awareness. Pt reported some LE fatigue end of session.      OBJECTIVE IMPAIRMENTS Abnormal gait, decreased activity tolerance, decreased balance, decreased mobility, difficulty walking, decreased strength, improper body mechanics, and postural dysfunction.    ACTIVITY LIMITATIONS carrying, lifting, bending, standing, squatting, stairs, transfers, bathing, toileting, and locomotion level   PARTICIPATION LIMITATIONS: meal prep, cleaning, driving, and shopping   PERSONAL FACTORS Age and Behavior pattern are also affecting patient's functional outcome.    REHAB POTENTIAL: Good   CLINICAL DECISION MAKING: Stable/uncomplicated  EVALUATION COMPLEXITY: Moderate   PLAN: PT FREQUENCY: 2x/week   PT DURATION: 10 weeks   PLANNED INTERVENTIONS: Therapeutic exercises, Therapeutic activity, Neuromuscular re-education, Balance training, Gait training, Patient/Family education, Self Care, Joint mobilization, Stair training, and Manual therapy   PLAN FOR NEXT SESSION: balance and gait progressions     2:15 PM,09/26/21 Sherol Dade PT, [place] [facility]

## 2021-09-25 NOTE — Telephone Encounter (Signed)
Patients Dental officeDaleen Ballard from  Dr. [facility] Dentist office) called to inquire about a procedure that the patient reported having done, that would require for patient to Pre- Medicate before any oral surgeries, explained to [place] that there was nothing showing that in her chart. Also advised dentist office that we would have to have a clearance to discuss any procedures that patient has had performed.

## 2021-09-26 ENCOUNTER — Ambulatory Visit: Payer: Medicare Other | Attending: Neurology | Admitting: Physical Therapy

## 2021-09-26 ENCOUNTER — Encounter: Payer: Self-pay | Admitting: Physical Therapy

## 2021-09-26 DIAGNOSIS — R262 Difficulty in walking, not elsewhere classified: Secondary | ICD-10-CM | POA: Diagnosis not present

## 2021-09-26 DIAGNOSIS — R278 Other lack of coordination: Secondary | ICD-10-CM | POA: Diagnosis not present

## 2021-09-26 DIAGNOSIS — M6281 Muscle weakness (generalized): Secondary | ICD-10-CM | POA: Insufficient documentation

## 2021-09-26 DIAGNOSIS — R2681 Unsteadiness on feet: Secondary | ICD-10-CM | POA: Diagnosis not present

## 2021-09-26 DIAGNOSIS — R293 Abnormal posture: Secondary | ICD-10-CM | POA: Insufficient documentation

## 2021-09-27 ENCOUNTER — Encounter: Payer: Self-pay | Admitting: Cardiology

## 2021-09-27 NOTE — Telephone Encounter (Signed)
From patient.

## 2021-10-01 NOTE — Therapy (Signed)
OUTPATIENT PHYSICAL THERAPY TREATMENT NOTE   Patient Name: Monique Ballard MRN: [id number] DOB:October 01, 1940, 81 y.o., female Today's Date: 10/02/2021    END OF SESSION:   PT End of Session - 10/02/21 1231     Visit Number 4    Date for PT Re-Evaluation 11/26/21    PT Start Time 1230    PT Stop Time 1313    PT Time Calculation (min) 43 min    Equipment Utilized During Treatment Gait belt    Activity Tolerance Patient tolerated treatment well    Behavior During Therapy WFL for tasks assessed/performed              Past Medical History:  Diagnosis Date   Anxiety    Breast cancer ([place]) fall 1997    Left Lumpectomy, XRT.  treated with Tamoxifem and Evista   Depression    Endometriosis in her 20's   surgery to help clear endometriosis to obtain a pregnancy.   GERD (gastroesophageal reflux disease)    w/"stretching" remotely   HTN (hypertension)    Migraines    Past Surgical History:  Procedure Laterality Date   BREAST LUMPECTOMY Left fall 1997   Lymph nodes X 3 were negative, ER+, Radiation treatmetn.  Took Tamoxifem X 5 yrs then Evista for 3-5 yrs.   CESAREAN SECTION     x2  first pregnancy breach and second normal   PATENT FORAMEN OVALE(PFO) CLOSURE N/A 07/12/2021   Procedure: PATENT FORAMEN OVALE(PFO) CLOSURE;  Surgeon: Adrian Prows, MD;  Location: [facility];  Service: Cardiovascular;  Laterality: N/A;   RIGHT HEART CATH N/A 07/12/2021   Procedure: RIGHT HEART CATH;  Surgeon: Adrian Prows, MD;  Location: [facility];  Service: Cardiovascular;  Laterality: N/A;   surgery for endometriosis     Patient Active Problem List   Diagnosis Date Noted   Acute ischemic stroke ([place]) 08/20/2021   Closed fracture of lateral malleolus of right fibula 08/07/2021   Debility 07/15/2021   Platypnea-orthodeoxia syndrome    Polycythemia vera, acquired ([place])    Hypoxia 07/08/2021   Nocturnal hypoxia 07/26/2020   Alcohol dependency ([place]) 05/15/2018   Tremor 02/24/2015    Gait disorder 02/24/2015   PCP NOTES >>>>>>> 12/13/2014   Osteoporosis 10/04/2011   Annual physical exam 09/07/2010   GERD 08/28/2009   Anxiety and depression 10/06/2006   Essential hypertension 10/06/2006   BREAST CANCER, HX OF 10/06/2006    ONSET DATE: 08/21/2021    REFERRING DIAG: Diagnosis I63.9 (ICD-10-CM) - Acute ischemic stroke (HCC)    THERAPY DIAG:  Unsteadiness on feet   Muscle weakness (generalized)   Other lack of coordination   Difficulty in walking, not elsewhere classified   Abnormal posture   Rationale for Evaluation and Treatment Rehabilitation   SUBJECTIVE:                                                                            SUBJECTIVE STATEMENT: I am fine, walking better every time, using walker all the time but wish I could get rid of it.    PERTINENT HISTORY: HISTORY OF PRESENT ILLNESS Ms. Monique Ballard is a 81 y.o. female with history of HTN, dementia, BRCA, migraines,  alcohol dependence, anxiety and recent PFO closure to treat persistent hypoxia presenting with  acute onset expressive aphasia.  She was brought to the hospital and given TNK to treat her stroke.  Her symptoms have improved.  Patient reportedly had a similar episode of expressive aphasia about a week ago that resolved spontaneously.  She had a procedure to close a PFO in June of this year.  MRI reveals no acute abnormality, so stroke is likely to small to be seen. She has been started on 2 months of Eliquis with ASA 21m and is to follow up with cardiology outpatient as well.    PAIN:  Are you having pain? No   PRECAUTIONS: None   WEIGHT BEARING RESTRICTIONS No   FALLS: Has patient fallen in last 6 months? Yes. Number of falls 2   LIVING ENVIRONMENT: Lives with: lives with their spouse, lives with their son, and 7 hours of personal care attendant M-F Lives in: House/apartment Stairs: Yes: External: 2 steps; can reach both Has following equipment at home: Quad cane small  base, Walker - 2 wheeled, WEnvironmental consultant- 4 wheeled, Shower bench, and bed side commode   PLOF: Needs assistance with ADLs and Needs assistance with gait   PATIENT GOALS Be able to walk without the walker.   OBJECTIVE:    DIAGNOSTIC FINDINGS: DISCHARGE DIAGNOSIS: left hemispheric stroke too small to be seen on MRI versus TIA possibly embolic from recent PFO closure -device thrombosis versus transient paroxysmal A-fib s/p Iv TNk with full recovery Principal Problem:   Acute ischemic stroke ([place] Platypnoea orthodeoxia syndrome s/p PFO closure   COGNITION: Overall cognitive status: Within functional limits for tasks assessed             SENSATION: WFL   COORDINATION: WFL U and LE   EDEMA:  None noted   MUSCLE TONE: RLE: Within functional limits     MUSCLE LENGTH: Hamstrings: Right 72 deg; Left 81 deg   POSTURE: rounded shoulders and forward head   LOWER EXTREMITY ROM:    WFL BLE, including B ankles     LOWER EXTREMITY MMT:     MMT Right Eval Left Eval  Hip flexion 4 4  Hip extension 4 4  Hip abduction 4 4  Hip adduction      Hip internal rotation      Hip external rotation      Knee flexion 4 4  Knee extension 4 4  Ankle dorsiflexion 4 2  Ankle plantarflexion   2+  Ankle inversion      Ankle eversion      (Blank rows = not tested)   BED MOBILITY:  I   TRANSFERS: Assistive device utilized: WEnvironmental consultant- 2 wheeled  Sit to stand: SBA Stand to sit: SBA Chair to chair: SBA   CURB:  Level of Assistance: Min A Assistive device utilized: WEnvironmental consultant- 2 wheeled   STAIRS:           Level of Assistance: CGA           Stair Negotiation Technique: Step to Pattern with Bilateral Rails           Number of Stairs: 2              GAIT: Gait pattern: step through pattern, decreased ankle dorsiflexion- Left, Left steppage, Left foot flat, and poor foot clearance- Left Distance walked: 80 Assistive device utilized: Walker - 2 wheeled Level of assistance: CGA Comments: Patient  mildly unsteady, especially when turning.  FUNCTIONAL TESTs:  5 times sit to stand: 18.10 Timed up and go (TUG): 19.57 Functional gait assessment: TBD   PATIENT SURVEYS:  FOTO 57.59   TODAY'S TREATMENT:  10/02/21 Nustep L4 x69mns  Ankle redTB 2x10 LLE PF, INV/EV LAQ 2# 2x10 BLE  Marching in walker 2#  HS curls greenTB 2x10 BLE STS 2x10, CGA  Step up on airex  Side steps on airex  Walking with quad cane 2x245f  09/26/21 Tandem stance 2x30 sec each, 1 finger support NBOS x30 sec on foam NBOS on foam, trunk rotation holding weighted yellow ball 2x5 reps, CGA Sit to stand without UE 2x5 reps, PT cues to scoot forward in chair before standing Side stepping in // bars 2 HHA and 1 HHA x2 rounds Standing hip abduction 2# ankle weights, 2x10 reps  Standing hip flexion 2#, 2x10 reps  Gait training: using SPC x50' CGA and PT cuing to contact SPC with LLE; using SBQC x50' CGA Nustep L5x3 min  09/19/21 NuStep L5 x 5 minutes FGA-19/27 Side to side step with UE support, sit to stand wihtout UE support gait with DF A with RW, CGA, without AD, stumbled once, required mod A to recover, 100' Seated hand walk outs.     09/17/21 Education     PATIENT EDUCATION: Education details: exercise technique Person educated: Patient and Child(ren) Education method: Explanation Education comprehension: verbalized understanding     HOME EXERCISE PROGRAM:  [id number]   GOALS: Goals reviewed with patient? Yes   SHORT TERM GOALS: Target date: 10/15/2021   I with basic HEP Baseline: Goal status: ongoing   2.  Determine whether to pursue AFO for L foot drop Baseline: pt seems content without one Goal status: ONGOING   LONG TERM GOALS: Target date: 11/26/2021   I with final HEP Baseline:  Goal status: INITIAL   2.  Patient will perform all functional transfers MI Baseline: CGA Goal status: INITIAL   3.  Patient will perform 5 x STS in < 12 sec to demonstrate improved functional  strength and control Baseline: 18.10 Goal status: INITIAL   4.  Perform TUG in < 12 sec to demonstrate improved balance/decreased fall risk. Baseline: 19.57 Goal status: INITIAL   5.  Patient will ambulate x at least 500' functionally on indoor surfaces with LRAD, any necessary bracing for L ankle, MI Baseline: CGA, RW, foot drop, unsteady. Goal status: INITIAL   6. Increase FOTO to at least 68            Baseline 58            Goal Status INITIAL     ASSESSMENT:   CLINICAL IMPRESSION: Patient ambulating with RW, walks with high steppage gait due to foot drop on LLE. Has very slight activation of anterior tib with DF motion. Some unsteadiness with STS, losses balance a few times.      OBJECTIVE IMPAIRMENTS Abnormal gait, decreased activity tolerance, decreased balance, decreased mobility, difficulty walking, decreased strength, improper body mechanics, and postural dysfunction. Gait training with quad cane, patient is unstable and requires modA to help regain balance and prevent fall. Will benefit from more gait and strength training.    ACTIVITY LIMITATIONS carrying, lifting, bending, standing, squatting, stairs, transfers, bathing, toileting, and locomotion level   PARTICIPATION LIMITATIONS: meal prep, cleaning, driving, and shopping   PERSONAL FACTORS Age and Behavior pattern are also affecting patient's functional outcome.    REHAB POTENTIAL: Good   CLINICAL DECISION MAKING: Stable/uncomplicated   EVALUATION COMPLEXITY:  Moderate   PLAN: PT FREQUENCY: 2x/week   PT DURATION: 10 weeks   PLANNED INTERVENTIONS: Therapeutic exercises, Therapeutic activity, Neuromuscular re-education, Balance training, Gait training, Patient/Family education, Self Care, Joint mobilization, Stair training, and Manual therapy   PLAN FOR NEXT SESSION: balance and gait progressions     1:14 PM,10/02/21 Sherol Dade PT, [place] [facility] [place]

## 2021-10-02 ENCOUNTER — Ambulatory Visit: Payer: Medicare Other

## 2021-10-02 DIAGNOSIS — R2681 Unsteadiness on feet: Secondary | ICD-10-CM | POA: Diagnosis not present

## 2021-10-02 DIAGNOSIS — R262 Difficulty in walking, not elsewhere classified: Secondary | ICD-10-CM | POA: Diagnosis not present

## 2021-10-02 DIAGNOSIS — R293 Abnormal posture: Secondary | ICD-10-CM

## 2021-10-02 DIAGNOSIS — R278 Other lack of coordination: Secondary | ICD-10-CM | POA: Diagnosis not present

## 2021-10-02 DIAGNOSIS — M6281 Muscle weakness (generalized): Secondary | ICD-10-CM

## 2021-10-03 ENCOUNTER — Ambulatory Visit: Payer: Medicare Other | Admitting: Cardiology

## 2021-10-03 ENCOUNTER — Encounter: Payer: Self-pay | Admitting: Cardiology

## 2021-10-03 VITALS — BP 144/85 | HR 71 | Resp 16 | Ht 63.0 in | Wt 123.4 lb

## 2021-10-03 DIAGNOSIS — I639 Cerebral infarction, unspecified: Secondary | ICD-10-CM

## 2021-10-03 DIAGNOSIS — Z95818 Presence of other cardiac implants and grafts: Secondary | ICD-10-CM | POA: Insufficient documentation

## 2021-10-03 HISTORY — DX: Presence of other cardiac implants and grafts: Z95.818

## 2021-10-03 NOTE — Progress Notes (Signed)
  Prodedure performed: Insertion of Biotronik Loop Recorder. Serial # [id number]  Indication: Cryptogenic stroke  PHYSICAL EXAM:    10/03/2021    9:06 AM 08/28/2021    3:47 PM 08/22/2021   11:30 AM  Vitals with BMI  Height '5\' 3"'$  '5\' 3"'$    Weight 123 lbs 6 oz 111 lbs 10 oz   BMI 63.89 37.34   Systolic 287 681   Diastolic 85 63   Pulse 71 71 82    CONSTITUTIONAL: Appears older than stated age, hemodynamically stable, no acute distress  SKIN: Skin is warm and dry. No rash noted. No cyanosis. No pallor. No jaundice HEAD: Normocephalic and atraumatic.  EYES: No scleral icterus MOUTH/THROAT: Moist oral membranes.  NECK: No JVD present. No thyromegaly noted. No carotid bruits  CHEST Normal respiratory effort. No intercostal retractions  LUNGS: Clear to auscultation bilaterally.  No stridor. No wheezes. No rales.  CARDIOVASCULAR: Regular, positive S1-S2, no murmurs rubs or gallops appreciated ABDOMINAL: Soft, nontender, nondistended, positive bowel sounds in all 4 quadrants, no apparent ascites.  EXTREMITIES: No peripheral edema  HEMATOLOGIC: No significant bruising NEUROLOGIC: Oriented to person, place, and time. Nonfocal. Normal muscle tone.  PSYCHIATRIC: Normal mood and affect. Normal behavior. Cooperative   After obtaining informed consent, explaining the procedure to the patient, under sterile precautions, local anesthesia with 1% lidocaine with epinephrine was injected subcutaneously in the left parasternal region.  20 mL utilized.  A small nick was made in the left paraspinal region at the 3rd intercostal space. Biotronik loop recorder was inserted without any complications.  Patient tolerated the procedure well.  The incision was closed with Steri-Strips.  There is no blood loss, no hematoma.  Written instrtuctions regarding wound care given to the patient.  Device setting:  R wave amplitude 0.31m.  Programmed to AF detection to Medium.  HVR 180/min. Bradycardia 50 BPM Asystole  3 Sec    ICD-10-CM   1. Cryptogenic stroke ([facility]  I63.9        SMechele ClaudeFCitadel Infirmary Pager: [phone]: [phone]

## 2021-10-04 ENCOUNTER — Ambulatory Visit: Payer: Medicare Other

## 2021-10-04 DIAGNOSIS — R262 Difficulty in walking, not elsewhere classified: Secondary | ICD-10-CM | POA: Diagnosis not present

## 2021-10-04 DIAGNOSIS — R293 Abnormal posture: Secondary | ICD-10-CM

## 2021-10-04 DIAGNOSIS — R2681 Unsteadiness on feet: Secondary | ICD-10-CM | POA: Diagnosis not present

## 2021-10-04 DIAGNOSIS — Z23 Encounter for immunization: Secondary | ICD-10-CM | POA: Diagnosis not present

## 2021-10-04 DIAGNOSIS — R278 Other lack of coordination: Secondary | ICD-10-CM

## 2021-10-04 DIAGNOSIS — M6281 Muscle weakness (generalized): Secondary | ICD-10-CM

## 2021-10-04 NOTE — Therapy (Signed)
OUTPATIENT PHYSICAL THERAPY TREATMENT NOTE   Patient Name: Monique Ballard MRN: [id number] DOB:09/18/1940, 81 y.o., female Today's Date: 10/04/2021     Past Medical History:  Diagnosis Date   Anxiety    Breast cancer ([place]) fall 1997    Left Lumpectomy, XRT.  treated with Tamoxifem and Evista   Depression    Endometriosis in her 20's   surgery to help clear endometriosis to obtain a pregnancy.   GERD (gastroesophageal reflux disease)    w/"stretching" remotely   HTN (hypertension)    Migraines    Past Surgical History:  Procedure Laterality Date   BREAST LUMPECTOMY Left fall 1997   Lymph nodes X 3 were negative, ER+, Radiation treatmetn.  Took Tamoxifem X 5 yrs then Evista for 3-5 yrs.   CESAREAN SECTION     x2  first pregnancy breach and second normal   PATENT FORAMEN OVALE(PFO) CLOSURE N/A 07/12/2021   Procedure: PATENT FORAMEN OVALE(PFO) CLOSURE;  Surgeon: Adrian Prows, MD;  Location: [facility];  Service: Cardiovascular;  Laterality: N/A;   RIGHT HEART CATH N/A 07/12/2021   Procedure: RIGHT HEART CATH;  Surgeon: Adrian Prows, MD;  Location: [facility];  Service: Cardiovascular;  Laterality: N/A;   surgery for endometriosis     Patient Active Problem List   Diagnosis Date Noted   Acute ischemic stroke ([place]) 08/20/2021   Closed fracture of lateral malleolus of right fibula 08/07/2021   Debility 07/15/2021   Platypnea-orthodeoxia syndrome    Polycythemia vera, acquired ([place])    Hypoxia 07/08/2021   Nocturnal hypoxia 07/26/2020   Alcohol dependency ([place]) 05/15/2018   Tremor 02/24/2015   Gait disorder 02/24/2015   PCP NOTES >>>>>>> 12/13/2014   Osteoporosis 10/04/2011   Annual physical exam 09/07/2010   GERD 08/28/2009   Anxiety and depression 10/06/2006   Essential hypertension 10/06/2006   BREAST CANCER, HX OF 10/06/2006    ONSET DATE: 08/21/2021    REFERRING DIAG: Diagnosis I63.9 (ICD-10-CM) - Acute ischemic stroke (HCC)    THERAPY DIAG:   Unsteadiness on feet   Muscle weakness (generalized)   Other lack of coordination   Difficulty in walking, not elsewhere classified   Abnormal posture   Rationale for Evaluation and Treatment Rehabilitation   SUBJECTIVE:                                                                            SUBJECTIVE STATEMENT: I am fine, walking better every time, using walker all the time but wish I could get rid of it.    PERTINENT HISTORY: HISTORY OF PRESENT ILLNESS Monique Ballard is a 81 y.o. female with history of HTN, dementia, BRCA, migraines, alcohol dependence, anxiety and recent PFO closure to treat persistent hypoxia presenting with  acute onset expressive aphasia.  She was brought to the hospital and given TNK to treat her stroke.  Her symptoms have improved.  Patient reportedly had a similar episode of expressive aphasia about a week ago that resolved spontaneously.  She had a procedure to close a PFO in June of this year.  MRI reveals no acute abnormality, so stroke is likely to small to be seen. She has been started on 2 months  of Eliquis with ASA 78m and is to follow up with cardiology outpatient as well.    PAIN:  Are you having pain? No   PRECAUTIONS: None   WEIGHT BEARING RESTRICTIONS No   FALLS: Has patient fallen in last 6 months? Yes. Number of falls 2   LIVING ENVIRONMENT: Lives with: lives with their spouse, lives with their son, and 7 hours of personal care attendant M-F Lives in: House/apartment Stairs: Yes: External: 2 steps; can reach both Has following equipment at home: Quad cane small base, Walker - 2 wheeled, WEnvironmental consultant- 4 wheeled, Shower bench, and bed side commode   PLOF: Needs assistance with ADLs and Needs assistance with gait   PATIENT GOALS Be able to walk without the walker.   OBJECTIVE:    DIAGNOSTIC FINDINGS: DISCHARGE DIAGNOSIS: left hemispheric stroke too small to be seen on MRI versus TIA possibly embolic from recent PFO closure -device  thrombosis versus transient paroxysmal A-fib s/p Iv TNk with full recovery Principal Problem:   Acute ischemic stroke ([place] Platypnoea orthodeoxia syndrome s/p PFO closure   COGNITION: Overall cognitive status: Within functional limits for tasks assessed             SENSATION: WFL   COORDINATION: WFL U and LE   EDEMA:  None noted   MUSCLE TONE: RLE: Within functional limits     MUSCLE LENGTH: Hamstrings: Right 72 deg; Left 81 deg   POSTURE: rounded shoulders and forward head   LOWER EXTREMITY ROM:    WFL BLE, including B ankles     LOWER EXTREMITY MMT:     MMT Right Eval Left Eval  Hip flexion 4 4  Hip extension 4 4  Hip abduction 4 4  Hip adduction      Hip internal rotation      Hip external rotation      Knee flexion 4 4  Knee extension 4 4  Ankle dorsiflexion 4 2  Ankle plantarflexion   2+  Ankle inversion      Ankle eversion      (Blank rows = not tested)   BED MOBILITY:  I   TRANSFERS: Assistive device utilized: WEnvironmental consultant- 2 wheeled  Sit to stand: SBA Stand to sit: SBA Chair to chair: SBA   CURB:  Level of Assistance: Min A Assistive device utilized: WEnvironmental consultant- 2 wheeled   STAIRS:           Level of Assistance: CGA           Stair Negotiation Technique: Step to Pattern with Bilateral Rails           Number of Stairs: 2              GAIT: Gait pattern: step through pattern, decreased ankle dorsiflexion- Left, Left steppage, Left foot flat, and poor foot clearance- Left Distance walked: 80 Assistive device utilized: Walker - 2 wheeled Level of assistance: CGA Comments: Patient mildly unsteady, especially when turning.   FUNCTIONAL TESTs:  5 times sit to stand: 18.10 Timed up and go (TUG): 19.57 Functional gait assessment: TBD   PATIENT SURVEYS:  FOTO 57.59   TODAY'S TREATMENT:  10/04/21 Nustep L4 x674ms Walking in // bars  Side steps in // bars  2# marches 2x10 2# hip abduction 2x10 Feet together on foam 30s Half tandem on foam  30s  Tandem on foam at best 4s  STS w/yellow ball push out 2x10  Walking w/quad cane 2x5042f  10/02/21 Nustep L4  x31mns  Ankle redTB 2x10 LLE PF, INV/EV LAQ 2# 2x10 BLE  Marching in walker 2#  HS curls greenTB 2x10 BLE STS 2x10, CGA  Step up on airex  Side steps on airex  Walking with quad cane 2x544f  09/26/21 Tandem stance 2x30 sec each, 1 finger support NBOS x30 sec on foam NBOS on foam, trunk rotation holding weighted yellow ball 2x5 reps, CGA Sit to stand without UE 2x5 reps, PT cues to scoot forward in chair before standing Side stepping in // bars 2 HHA and 1 HHA x2 rounds Standing hip abduction 2# ankle weights, 2x10 reps  Standing hip flexion 2#, 2x10 reps  Gait training: using SPC x50' CGA and PT cuing to contact SPC with LLE; using SBQC x50' CGA Nustep L5x3 min  09/19/21 NuStep L5 x 5 minutes FGA-19/27 Side to side step with UE support, sit to stand wihtout UE support gait with DF A with RW, CGA, without AD, stumbled once, required mod A to recover, 100' Seated hand walk outs.     09/17/21 Education     PATIENT EDUCATION: Education details: exercise technique Person educated: Patient and Child(ren) Education method: Explanation Education comprehension: verbalized understanding     HOME EXERCISE PROGRAM:  [id number]   GOALS: Goals reviewed with patient? Yes   SHORT TERM GOALS: Target date: 10/15/2021   I with basic HEP Baseline: Goal status: ongoing   2.  Determine whether to pursue AFO for L foot drop Baseline: pt seems content without one Goal status: ONGOING   LONG TERM GOALS: Target date: 11/26/2021   I with final HEP Baseline:  Goal status: INITIAL   2.  Patient will perform all functional transfers MI Baseline: CGA Goal status: INITIAL   3.  Patient will perform 5 x STS in < 12 sec to demonstrate improved functional strength and control Baseline: 18.10 Goal status: INITIAL   4.  Perform TUG in < 12 sec to demonstrate improved  balance/decreased fall risk. Baseline: 19.57 Goal status: INITIAL   5.  Patient will ambulate x at least 500' functionally on indoor surfaces with LRAD, any necessary bracing for L ankle, MI Baseline: CGA, RW, foot drop, unsteady. Goal status: INITIAL   6. Increase FOTO to at least 68            Baseline 58            Goal Status INITIAL     ASSESSMENT:   CLINICAL IMPRESSION: Patient able to take steps in parallel bars without holding on a few steps at a time. With more practice, is able to build up confidence and is more stable on steps. Does well with side steps without holding on for support. Talked about possibly getting an AFO for her L foot drop, agreed to have PT reach out to PCP to order it. Difficulty with tandem standing on foam, unable to hold for more than 4s bilaterally. Gait training with quad cane, worked on pattern. Is more stable today but still require min-modA. Cues for STS to lean forward and not use momentum so much to prevent backwards LOB and using legs against table to support.      OBJECTIVE IMPAIRMENTS Abnormal gait, decreased activity tolerance, decreased balance, decreased mobility, difficulty walking, decreased strength, improper body mechanics, and postural dysfunction. Gait training with quad cane, patient is unstable and requires modA to help regain balance and prevent fall. Will benefit from more gait and strength training.    ACTIVITY LIMITATIONS carrying, lifting, bending,  standing, squatting, stairs, transfers, bathing, toileting, and locomotion level   PARTICIPATION LIMITATIONS: meal prep, cleaning, driving, and shopping   PERSONAL FACTORS Age and Behavior pattern are also affecting patient's functional outcome.    REHAB POTENTIAL: Good   CLINICAL DECISION MAKING: Stable/uncomplicated   EVALUATION COMPLEXITY: Moderate   PLAN: PT FREQUENCY: 2x/week   PT DURATION: 10 weeks   PLANNED INTERVENTIONS: Therapeutic exercises, Therapeutic activity,  Neuromuscular re-education, Balance training, Gait training, Patient/Family education, Self Care, Joint mobilization, Stair training, and Manual therapy   PLAN FOR NEXT SESSION: balance and gait progressions     1:30 PM,10/04/21 Sherol Dade PT, [place] at [place]

## 2021-10-09 ENCOUNTER — Encounter: Payer: Self-pay | Admitting: Physical Therapy

## 2021-10-09 ENCOUNTER — Ambulatory Visit: Payer: Medicare Other | Admitting: Physical Therapy

## 2021-10-09 DIAGNOSIS — R278 Other lack of coordination: Secondary | ICD-10-CM | POA: Diagnosis not present

## 2021-10-09 DIAGNOSIS — R2681 Unsteadiness on feet: Secondary | ICD-10-CM | POA: Diagnosis not present

## 2021-10-09 DIAGNOSIS — R293 Abnormal posture: Secondary | ICD-10-CM

## 2021-10-09 DIAGNOSIS — R262 Difficulty in walking, not elsewhere classified: Secondary | ICD-10-CM | POA: Diagnosis not present

## 2021-10-09 DIAGNOSIS — M6281 Muscle weakness (generalized): Secondary | ICD-10-CM | POA: Diagnosis not present

## 2021-10-09 NOTE — Therapy (Signed)
OUTPATIENT PHYSICAL THERAPY TREATMENT NOTE   Patient Name: Monique Ballard MRN: [id number] DOB:16-Apr-1940, 81 y.o., female Today's Date: 10/09/2021     Past Medical History:  Diagnosis Date   Anxiety    Breast cancer ([place]) fall 1997    Left Lumpectomy, XRT.  treated with Tamoxifem and Evista   Depression    Endometriosis in her 20's   surgery to help clear endometriosis to obtain a pregnancy.   GERD (gastroesophageal reflux disease)    w/"stretching" remotely   HTN (hypertension)    Migraines    Past Surgical History:  Procedure Laterality Date   BREAST LUMPECTOMY Left fall 1997   Lymph nodes X 3 were negative, ER+, Radiation treatmetn.  Took Tamoxifem X 5 yrs then Evista for 3-5 yrs.   CESAREAN SECTION     x2  first pregnancy breach and second normal   PATENT FORAMEN OVALE(PFO) CLOSURE N/A 07/12/2021   Procedure: PATENT FORAMEN OVALE(PFO) CLOSURE;  Surgeon: Adrian Prows, MD;  Location: [facility];  Service: Cardiovascular;  Laterality: N/A;   RIGHT HEART CATH N/A 07/12/2021   Procedure: RIGHT HEART CATH;  Surgeon: Adrian Prows, MD;  Location: [facility];  Service: Cardiovascular;  Laterality: N/A;   surgery for endometriosis     Patient Active Problem List   Diagnosis Date Noted   Acute ischemic stroke ([place]) 08/20/2021   Closed fracture of lateral malleolus of right fibula 08/07/2021   Debility 07/15/2021   Platypnea-orthodeoxia syndrome    Polycythemia vera, acquired ([place])    Hypoxia 07/08/2021   Nocturnal hypoxia 07/26/2020   Alcohol dependency ([place]) 05/15/2018   Tremor 02/24/2015   Gait disorder 02/24/2015   PCP NOTES >>>>>>> 12/13/2014   Osteoporosis 10/04/2011   Annual physical exam 09/07/2010   GERD 08/28/2009   Anxiety and depression 10/06/2006   Essential hypertension 10/06/2006   BREAST CANCER, HX OF 10/06/2006    ONSET DATE: 08/21/2021    REFERRING DIAG: Diagnosis I63.9 (ICD-10-CM) - Acute ischemic stroke (HCC)    THERAPY DIAG:   Unsteadiness on feet   Muscle weakness (generalized)   Other lack of coordination   Difficulty in walking, not elsewhere classified   Abnormal posture   Rationale for Evaluation and Treatment Rehabilitation   SUBJECTIVE:                                                                            SUBJECTIVE STATEMENT: Patient reports she would still like to get rid of her walker. Dr was contacted regarding AFO, but has not responded. Second message sent.   PERTINENT HISTORY: HISTORY OF PRESENT ILLNESS Monique Ballard is a 81 y.o. female with history of HTN, dementia, BRCA, migraines, alcohol dependence, anxiety and recent PFO closure to treat persistent hypoxia presenting with  acute onset expressive aphasia.  She was brought to the hospital and given TNK to treat her stroke.  Her symptoms have improved.  Patient reportedly had a similar episode of expressive aphasia about a week ago that resolved spontaneously.  She had a procedure to close a PFO in June of this year.  MRI reveals no acute abnormality, so stroke is likely to small to be seen. She has been started  on 2 months of Eliquis with ASA 71m and is to follow up with cardiology outpatient as well.    PAIN:  Are you having pain? No   PRECAUTIONS: None   WEIGHT BEARING RESTRICTIONS No   FALLS: Has patient fallen in last 6 months? Yes. Number of falls 2   LIVING ENVIRONMENT: Lives with: lives with their spouse, lives with their son, and 7 hours of personal care attendant M-F Lives in: House/apartment Stairs: Yes: External: 2 steps; can reach both Has following equipment at home: Quad cane small base, Walker - 2 wheeled, WEnvironmental consultant- 4 wheeled, Shower bench, and bed side commode   PLOF: Needs assistance with ADLs and Needs assistance with gait   PATIENT GOALS Be able to walk without the walker.   OBJECTIVE:    DIAGNOSTIC FINDINGS: DISCHARGE DIAGNOSIS: left hemispheric stroke too small to be seen on MRI versus TIA possibly  embolic from recent PFO closure -device thrombosis versus transient paroxysmal A-fib s/p Iv TNk with full recovery Principal Problem:   Acute ischemic stroke ([place] Platypnoea orthodeoxia syndrome s/p PFO closure   COGNITION: Overall cognitive status: Within functional limits for tasks assessed             SENSATION: WFL   COORDINATION: WFL U and LE   EDEMA:  None noted   MUSCLE TONE: RLE: Within functional limits     MUSCLE LENGTH: Hamstrings: Right 72 deg; Left 81 deg   POSTURE: rounded shoulders and forward head   LOWER EXTREMITY ROM:    WFL BLE, including B ankles     LOWER EXTREMITY MMT:     MMT Right Eval Left Eval  Hip flexion 4 4  Hip extension 4 4  Hip abduction 4 4  Hip adduction      Hip internal rotation      Hip external rotation      Knee flexion 4 4  Knee extension 4 4  Ankle dorsiflexion 4 2  Ankle plantarflexion   2+  Ankle inversion      Ankle eversion      (Blank rows = not tested)   BED MOBILITY:  I   TRANSFERS: Assistive device utilized: WEnvironmental consultant- 2 wheeled  Sit to stand: SBA Stand to sit: SBA Chair to chair: SBA   CURB:  Level of Assistance: Min A Assistive device utilized: WEnvironmental consultant- 2 wheeled   STAIRS:           Level of Assistance: CGA           Stair Negotiation Technique: Step to Pattern with Bilateral Rails           Number of Stairs: 2              GAIT: Gait pattern: step through pattern, decreased ankle dorsiflexion- Left, Left steppage, Left foot flat, and poor foot clearance- Left Distance walked: 80 Assistive device utilized: Walker - 2 wheeled Level of assistance: CGA Comments: Patient mildly unsteady, especially when turning.   FUNCTIONAL TESTs:  5 times sit to stand: 18.10 Timed up and go (TUG): 19.57 Functional gait assessment: TBD   PATIENT SURVEYS:  FOTO 57.59   TODAY'S TREATMENT:  10/08/21 NuStep L4 x 6 minutes L ankle ace wrapped into DF, then she walked with RW, with straight cane, and holding  straight cane horizontally out in front with BUE, CGA. Mildly increased unsteadiness and decreased balance and sped of movement without UE support, but overall able to clear RLE.  Alternate taps on  4" step, progressed to 3 taps with each foot, CGA, no LOB, but unsteady. Pick up cone in R hand, step back/into R rotation, place the cone on a stool, return. 5 reps in each direction Place cone from R side, reach to floor x 5, then reach to floor, pick up with LUE and reach to stool. Quick side to side steps ove r ine on floor. Able to move with quick pace, 1 episode of mild LOB. Ambulation without AD, first with ace wrap, then removed. She caught her toes x 1 while still wearing Ace wrap, but no stumbles without wrap.  10/04/21 Nustep L4 x39mns Walking in // bars  Side steps in // bars  2# marches 2x10 2# hip abduction 2x10 Feet together on foam 30s Half tandem on foam 30s  Tandem on foam at best 4s  STS w/yellow ball push out 2x10  Walking w/quad cane 2x592f   10/02/21 Nustep L4 x6m53m  Ankle redTB 2x10 LLE PF, INV/EV LAQ 2# 2x10 BLE  Marching in walker 2#  HS curls greenTB 2x10 BLE STS 2x10, CGA  Step up on airex  Side steps on airex  Walking with quad cane [id number]/6/23 Tandem stance 2x30 sec each, 1 finger support NBOS x30 sec on foam NBOS on foam, trunk rotation holding weighted yellow ball 2x5 reps, CGA Sit to stand without UE 2x5 reps, PT cues to scoot forward in chair before standing Side stepping in // bars 2 HHA and 1 HHA x2 rounds Standing hip abduction 2# ankle weights, 2x10 reps  Standing hip flexion 2#, 2x10 reps  Gait training: using SPC x50' CGA and PT cuing to contact SPC with LLE; using SBQC x50' CGA Nustep L5x3 min  09/19/21 NuStep L5 x 5 minutes FGA-19/27 Side to side step with UE support, sit to stand wihtout UE support gait with DF A with RW, CGA, without AD, stumbled once, required mod A to recover, 100' Seated hand walk outs.      09/17/21 Education     PATIENT EDUCATION: Education details: exercise technique Person educated: Patient and Child(ren) Education method: Explanation Education comprehension: verbalized understanding     HOME EXERCISE PROGRAM:  [id number]   GOALS: Goals reviewed with patient? Yes   SHORT TERM GOALS: Target date: 10/15/2021   I with basic HEP Baseline: Goal status: ongoing   2.  Determine whether to pursue AFO for L foot drop Baseline: pt seems content without one Goal status: ONGOING   LONG TERM GOALS: Target date: 11/26/2021   I with final HEP Baseline:  Goal status: INITIAL   2.  Patient will perform all functional transfers MI Baseline: CGA Goal status: INITIAL   3.  Patient will perform 5 x STS in < 12 sec to demonstrate improved functional strength and control Baseline: 18.10 Goal status: INITIAL   4.  Perform TUG in < 12 sec to demonstrate improved balance/decreased fall risk. Baseline: 19.57 Goal status: INITIAL   5.  Patient will ambulate x at least 500' functionally on indoor surfaces with LRAD, any necessary bracing for L ankle, MI Baseline: CGA, RW, foot drop, unsteady. Goal status: INITIAL   6. Increase FOTO to at least 68            Baseline 58            Goal Status INITIAL     ASSESSMENT:   CLINICAL IMPRESSION: Patient Progressing well. Challenged her with Ace wrap on L ankle to facilitate ankle Df,  then performed multiple challenging standing activities without UE support, CGA-Min A. She demonstrated improving stability with minimal LOB.      OBJECTIVE IMPAIRMENTS Abnormal gait, decreased activity tolerance, decreased balance, decreased mobility, difficulty walking, decreased strength, improper body mechanics, and postural dysfunction. Gait training with quad cane, patient is unstable and requires modA to help regain balance and prevent fall. Will benefit from more gait and strength training.    ACTIVITY LIMITATIONS carrying, lifting,  bending, standing, squatting, stairs, transfers, bathing, toileting, and locomotion level   PARTICIPATION LIMITATIONS: meal prep, cleaning, driving, and shopping   PERSONAL FACTORS Age and Behavior pattern are also affecting patient's functional outcome.    REHAB POTENTIAL: Good   CLINICAL DECISION MAKING: Stable/uncomplicated   EVALUATION COMPLEXITY: Moderate   PLAN: PT FREQUENCY: 2x/week   PT DURATION: 10 weeks   PLANNED INTERVENTIONS: Therapeutic exercises, Therapeutic activity, Neuromuscular re-education, Balance training, Gait training, Patient/Family education, Self Care, Joint mobilization, Stair training, and Manual therapy   PLAN FOR NEXT SESSION: balance and gait progressions     1:14 PM,10/09/21 Ethel Rana DPT 10/09/21 1:14 PM  [facility] at [place]

## 2021-10-10 ENCOUNTER — Ambulatory Visit: Payer: Medicare Other | Admitting: Cardiology

## 2021-10-10 ENCOUNTER — Encounter: Payer: Self-pay | Admitting: Cardiology

## 2021-10-10 ENCOUNTER — Other Ambulatory Visit: Payer: Self-pay

## 2021-10-10 VITALS — BP 132/83 | HR 84 | Temp 98.1°F | Resp 16 | Ht 63.0 in | Wt 123.6 lb

## 2021-10-10 DIAGNOSIS — Z4802 Encounter for removal of sutures: Secondary | ICD-10-CM

## 2021-10-10 DIAGNOSIS — Z8774 Personal history of (corrected) congenital malformations of heart and circulatory system: Secondary | ICD-10-CM

## 2021-10-10 DIAGNOSIS — I77819 Aortic ectasia, unspecified site: Secondary | ICD-10-CM

## 2021-10-10 DIAGNOSIS — R0609 Other forms of dyspnea: Secondary | ICD-10-CM

## 2021-10-10 DIAGNOSIS — I10 Essential (primary) hypertension: Secondary | ICD-10-CM

## 2021-10-10 DIAGNOSIS — M21372 Foot drop, left foot: Secondary | ICD-10-CM

## 2021-10-10 DIAGNOSIS — I639 Cerebral infarction, unspecified: Secondary | ICD-10-CM

## 2021-10-10 NOTE — Therapy (Signed)
OUTPATIENT PHYSICAL THERAPY TREATMENT NOTE   Patient Name: Monique Ballard MRN: [id number] DOB:[birth year], 81 y.o., female Today's Date: 10/11/2021     Past Medical History:  Diagnosis Date   Anxiety    Breast cancer ([place]) fall 1997    Left Lumpectomy, XRT.  treated with Tamoxifem and Evista   Depression    Endometriosis in her 20's   surgery to help clear endometriosis to obtain a pregnancy.   GERD (gastroesophageal reflux disease)    w/"stretching" remotely   HTN (hypertension)    Migraines    Past Surgical History:  Procedure Laterality Date   BREAST LUMPECTOMY Left fall 1997   Lymph nodes X 3 were negative, ER+, Radiation treatmetn.  Took Tamoxifem X 5 yrs then Evista for 3-5 yrs.   CESAREAN SECTION     x2  first pregnancy breach and second normal   PATENT FORAMEN OVALE(PFO) CLOSURE N/A 07/12/2021   Procedure: PATENT FORAMEN OVALE(PFO) CLOSURE;  Surgeon: Adrian Prows, MD;  Location: [facility];  Service: Cardiovascular;  Laterality: N/A;   RIGHT HEART CATH N/A 07/12/2021   Procedure: RIGHT HEART CATH;  Surgeon: Adrian Prows, MD;  Location: [facility];  Service: Cardiovascular;  Laterality: N/A;   surgery for endometriosis     Patient Active Problem List   Diagnosis Date Noted   Acute ischemic stroke ([place]) 08/20/2021   Closed fracture of lateral malleolus of right fibula 08/07/2021   Debility 07/15/2021   Platypnea-orthodeoxia syndrome    Polycythemia vera, acquired ([place])    Hypoxia 07/08/2021   Nocturnal hypoxia 07/26/2020   Alcohol dependency ([place]) 05/15/2018   Tremor 02/24/2015   Gait disorder 02/24/2015   PCP NOTES >>>>>>> 12/13/2014   Osteoporosis 10/04/2011   Annual physical exam 09/07/2010   GERD 08/28/2009   Anxiety and depression 10/06/2006   Essential hypertension 10/06/2006   BREAST CANCER, HX OF 10/06/2006    ONSET DATE: 08/21/2021    REFERRING DIAG: Diagnosis I63.9 (ICD-10-CM) - Acute ischemic stroke (HCC)    THERAPY DIAG:   Unsteadiness on feet   Muscle weakness (generalized)   Other lack of coordination   Difficulty in walking, not elsewhere classified   Abnormal posture   Rationale for Evaluation and Treatment Rehabilitation   SUBJECTIVE:                                                                            SUBJECTIVE STATEMENT: I feel like I am very steady with the walker, but without it I am not as steady. Someone still comes twice a week to help me bathe. Didn't sleep well last night so don't have a lot of energy today.    PERTINENT HISTORY: HISTORY OF PRESENT ILLNESS Monique Ballard is a 81 y.o. female with history of HTN, dementia, BRCA, migraines, alcohol dependence, anxiety and recent PFO closure to treat persistent hypoxia presenting with  acute onset expressive aphasia.  She was brought to the hospital and given TNK to treat her stroke.  Her symptoms have improved.  Patient reportedly had a similar episode of expressive aphasia about a week ago that resolved spontaneously.  She had a procedure to close a PFO in June of this year.  MRI reveals no acute abnormality, so stroke is likely to small to be seen. She has been started on 2 months of Eliquis with ASA 49m and is to follow up with cardiology outpatient as well.    PAIN:  Are you having pain? No   PRECAUTIONS: None   WEIGHT BEARING RESTRICTIONS No   FALLS: Has patient fallen in last 6 months? Yes. Number of falls 2   LIVING ENVIRONMENT: Lives with: lives with their spouse, lives with their son, and 7 hours of personal care attendant M-F Lives in: House/apartment Stairs: Yes: External: 2 steps; can reach both Has following equipment at home: Quad cane small base, Walker - 2 wheeled, WEnvironmental consultant- 4 wheeled, Shower bench, and bed side commode   PLOF: Needs assistance with ADLs and Needs assistance with gait   PATIENT GOALS Be able to walk without the walker.   OBJECTIVE:    DIAGNOSTIC FINDINGS: DISCHARGE DIAGNOSIS: left  hemispheric stroke too small to be seen on MRI versus TIA possibly embolic from recent PFO closure -device thrombosis versus transient paroxysmal A-fib s/p Iv TNk with full recovery Principal Problem:   Acute ischemic stroke ([place] Platypnoea orthodeoxia syndrome s/p PFO closure   COGNITION: Overall cognitive status: Within functional limits for tasks assessed             SENSATION: WFL   COORDINATION: WFL U and LE   EDEMA:  None noted   MUSCLE TONE: RLE: Within functional limits     MUSCLE LENGTH: Hamstrings: Right 72 deg; Left 81 deg   POSTURE: rounded shoulders and forward head   LOWER EXTREMITY ROM:    WFL BLE, including B ankles     LOWER EXTREMITY MMT:     MMT Right Eval Left Eval  Hip flexion 4 4  Hip extension 4 4  Hip abduction 4 4  Hip adduction      Hip internal rotation      Hip external rotation      Knee flexion 4 4  Knee extension 4 4  Ankle dorsiflexion 4 2  Ankle plantarflexion   2+  Ankle inversion      Ankle eversion      (Blank rows = not tested)   BED MOBILITY:  I   TRANSFERS: Assistive device utilized: WEnvironmental consultant- 2 wheeled  Sit to stand: SBA Stand to sit: SBA Chair to chair: SBA   CURB:  Level of Assistance: Min A Assistive device utilized: WEnvironmental consultant- 2 wheeled   STAIRS:           Level of Assistance: CGA           Stair Negotiation Technique: Step to Pattern with Bilateral Rails           Number of Stairs: 2              GAIT: Gait pattern: step through pattern, decreased ankle dorsiflexion- Left, Left steppage, Left foot flat, and poor foot clearance- Left Distance walked: 80 Assistive device utilized: Walker - 2 wheeled Level of assistance: CGA Comments: Patient mildly unsteady, especially when turning.   FUNCTIONAL TESTs:  5 times sit to stand: 18.10 Timed up and go (TUG): 19.57 Functional gait assessment: TBD   PATIENT SURVEYS:  FOTO 57.59   TODAY'S TREATMENT:  10/11/21 Nustep L4 x687ms Walking with cane 1 big  loop  Walking w/o AD Step up on airex 2HHA  Standing on airex holding 2# WaTE 30s Step up 6" from airex 2HHA at stairs  Taps on 6" standing on airex only 1 HHA Placing cones and picking them up on 6" step then did standing on airex minA  LAQ 3# 2x10 Calf raises 2x10 w/2HHA   10/08/21 NuStep L4 x 6 minutes L ankle ace wrapped into DF, then she walked with RW, with straight cane, and holding straight cane horizontally out in front with BUE, CGA. Mildly increased unsteadiness and decreased balance and sped of movement without UE support, but overall able to clear RLE.  Alternate taps on 4" step, progressed to 3 taps with each foot, CGA, no LOB, but unsteady. Pick up cone in R hand, step back/into R rotation, place the cone on a stool, return. 5 reps in each direction Place cone from R side, reach to floor x 5, then reach to floor, pick up with LUE and reach to stool. Quick side to side steps ove r ine on floor. Able to move with quick pace, 1 episode of mild LOB. Ambulation without AD, first with ace wrap, then removed. She caught her toes x 1 while still wearing Ace wrap, but no stumbles without wrap.  10/04/21 Nustep L4 x70mns Walking in // bars  Side steps in // bars  2# marches 2x10 2# hip abduction 2x10 Feet together on foam 30s Half tandem on foam 30s  Tandem on foam at best 4s  STS w/yellow ball push out 2x10  Walking w/quad cane 2x546f   10/02/21 Nustep L4 x6m27m  Ankle redTB 2x10 LLE PF, INV/EV LAQ 2# 2x10 BLE  Marching in walker 2#  HS curls greenTB 2x10 BLE STS 2x10, CGA  Step up on airex  Side steps on airex  Walking with quad cane [id number]/6/23 Tandem stance 2x30 sec each, 1 finger support NBOS x30 sec on foam NBOS on foam, trunk rotation holding weighted yellow ball 2x5 reps, CGA Sit to stand without UE 2x5 reps, PT cues to scoot forward in chair before standing Side stepping in // bars 2 HHA and 1 HHA x2 rounds Standing hip abduction 2# ankle weights,  2x10 reps  Standing hip flexion 2#, 2x10 reps  Gait training: using SPC x50' CGA and PT cuing to contact SPC with LLE; using SBQC x50' CGA Nustep L5x3 min  09/19/21 NuStep L5 x 5 minutes FGA-19/27 Side to side step with UE support, sit to stand wihtout UE support gait with DF A with RW, CGA, without AD, stumbled once, required mod A to recover, 100' Seated hand walk outs.     09/17/21 Education     PATIENT EDUCATION: Education details: exercise technique Person educated: Patient and Child(ren) Education method: Explanation Education comprehension: verbalized understanding     HOME EXERCISE PROGRAM:  [id number]   GOALS: Goals reviewed with patient? Yes   SHORT TERM GOALS: Target date: 10/15/2021   I with basic HEP Baseline: Goal status: ongoing   2.  Determine whether to pursue AFO for L foot drop Baseline: pt seems content without one Goal status: ONGOING   LONG TERM GOALS: Target date: 11/26/2021   I with final HEP Baseline:  Goal status: INITIAL   2.  Patient will perform all functional transfers MI Baseline: CGA Goal status: INITIAL   3.  Patient will perform 5 x STS in < 12 sec to demonstrate improved functional strength and control Baseline: 18.10 Goal status: INITIAL   4.  Perform TUG in < 12 sec to demonstrate improved balance/decreased fall risk. Baseline: 19.57 Goal status: INITIAL   5.  Patient  will ambulate x at least 500' functionally on indoor surfaces with LRAD, any necessary bracing for L ankle, MI Baseline: CGA, RW, foot drop, unsteady. Goal status: INITIAL   6. Increase FOTO to at least 68            Baseline 58            Goal Status INITIAL     ASSESSMENT:   CLINICAL IMPRESSION: Walking with cane she is a little unstable to begin, catches toes 2 times and needs reminders to slow down to prevent loss of balance. AFO order has been placed and will follow up about next steps with PCP. Unable to do marching on airex due to LOB, modified to  just standing. Difficulty with all activities on airex today, requires min-modA.      OBJECTIVE IMPAIRMENTS Abnormal gait, decreased activity tolerance, decreased balance, decreased mobility, difficulty walking, decreased strength, improper body mechanics, and postural dysfunction. Gait training with quad cane, patient is unstable and requires modA to help regain balance and prevent fall. Will benefit from more gait and strength training.    ACTIVITY LIMITATIONS carrying, lifting, bending, standing, squatting, stairs, transfers, bathing, toileting, and locomotion level   PARTICIPATION LIMITATIONS: meal prep, cleaning, driving, and shopping   PERSONAL FACTORS Age and Behavior pattern are also affecting patient's functional outcome.    REHAB POTENTIAL: Good   CLINICAL DECISION MAKING: Stable/uncomplicated   EVALUATION COMPLEXITY: Moderate   PLAN: PT FREQUENCY: 2x/week   PT DURATION: 10 weeks   PLANNED INTERVENTIONS: Therapeutic exercises, Therapeutic activity, Neuromuscular re-education, Balance training, Gait training, Patient/Family education, Self Care, Joint mobilization, Stair training, and Manual therapy   PLAN FOR NEXT SESSION: balance and gait progressions     1:20 PM,10/11/21 Ethel Rana DPT 10/11/21 1:20 PM  [place] at [place]

## 2021-10-10 NOTE — Progress Notes (Signed)
   Loop Recorder Implant follow up.   Patient came in for suture removal after recently undergoing [employer]otronik Loop Recorder on 10/03/2021.   The site is clean, dry, healing well.   Suture removed and bandage applied.   No signs of infection.   Discussed wound care w/ patient.      ICD-10-CM   1. Visit for suture removal  Z48.02     2. Cryptogenic stroke (HCC)  I63.9     3. Platypnea-orthodeoxia syndrome  R06.09     4. PFO closure with 25 mm Amplatzer Talisman septal occluder on 07/12/2021  Z87.74     5. Essential hypertension  I10     6. Ectatic aorta (HCC)  I77.819      No charge.   Rex Kras, [place], [facility]  Pager: [phone] Office: [phone]

## 2021-10-11 ENCOUNTER — Ambulatory Visit: Payer: Medicare Other

## 2021-10-11 DIAGNOSIS — R2681 Unsteadiness on feet: Secondary | ICD-10-CM | POA: Diagnosis not present

## 2021-10-11 DIAGNOSIS — R278 Other lack of coordination: Secondary | ICD-10-CM

## 2021-10-11 DIAGNOSIS — M6281 Muscle weakness (generalized): Secondary | ICD-10-CM | POA: Diagnosis not present

## 2021-10-11 DIAGNOSIS — R293 Abnormal posture: Secondary | ICD-10-CM | POA: Diagnosis not present

## 2021-10-11 DIAGNOSIS — R262 Difficulty in walking, not elsewhere classified: Secondary | ICD-10-CM | POA: Diagnosis not present

## 2021-10-13 ENCOUNTER — Other Ambulatory Visit: Payer: Self-pay | Admitting: Neurology

## 2021-10-13 DIAGNOSIS — M21379 Foot drop, unspecified foot: Secondary | ICD-10-CM

## 2021-10-16 ENCOUNTER — Ambulatory Visit: Payer: Medicare Other | Admitting: Physical Therapy

## 2021-10-16 ENCOUNTER — Encounter: Payer: Self-pay | Admitting: Physical Therapy

## 2021-10-16 DIAGNOSIS — R293 Abnormal posture: Secondary | ICD-10-CM | POA: Diagnosis not present

## 2021-10-16 DIAGNOSIS — R278 Other lack of coordination: Secondary | ICD-10-CM

## 2021-10-16 DIAGNOSIS — R262 Difficulty in walking, not elsewhere classified: Secondary | ICD-10-CM | POA: Diagnosis not present

## 2021-10-16 DIAGNOSIS — M6281 Muscle weakness (generalized): Secondary | ICD-10-CM

## 2021-10-16 DIAGNOSIS — R2681 Unsteadiness on feet: Secondary | ICD-10-CM | POA: Diagnosis not present

## 2021-10-16 NOTE — Therapy (Signed)
OUTPATIENT PHYSICAL THERAPY TREATMENT NOTE   Patient Name: Monique Ballard MRN: [id number] DOB:02-12-1940, 81 y.o., female Today's Date: 10/16/2021     Past Medical History:  Diagnosis Date   Anxiety    Breast cancer ([place]) fall 1997    Left Lumpectomy, XRT.  treated with Tamoxifem and Evista   Depression    Endometriosis in her 20's   surgery to help clear endometriosis to obtain a pregnancy.   GERD (gastroesophageal reflux disease)    w/"stretching" remotely   HTN (hypertension)    Migraines    Past Surgical History:  Procedure Laterality Date   BREAST LUMPECTOMY Left fall 1997   Lymph nodes X 3 were negative, ER+, Radiation treatmetn.  Took Tamoxifem X 5 yrs then Evista for 3-5 yrs.   CESAREAN SECTION     x2  first pregnancy breach and second normal   PATENT FORAMEN OVALE(PFO) CLOSURE N/A 07/12/2021   Procedure: PATENT FORAMEN OVALE(PFO) CLOSURE;  Surgeon: Adrian Prows, MD;  Location: [facility];  Service: Cardiovascular;  Laterality: N/A;   RIGHT HEART CATH N/A 07/12/2021   Procedure: RIGHT HEART CATH;  Surgeon: Adrian Prows, MD;  Location: [facility];  Service: Cardiovascular;  Laterality: N/A;   surgery for endometriosis     Patient Active Problem List   Diagnosis Date Noted   Acute ischemic stroke ([place]) 08/20/2021   Closed fracture of lateral malleolus of right fibula 08/07/2021   Debility 07/15/2021   Platypnea-orthodeoxia syndrome    Polycythemia vera, acquired ([place])    Hypoxia 07/08/2021   Nocturnal hypoxia 07/26/2020   Alcohol dependency ([place]) 05/15/2018   Tremor 02/24/2015   Gait disorder 02/24/2015   PCP NOTES >>>>>>> 12/13/2014   Osteoporosis 10/04/2011   Annual physical exam 09/07/2010   GERD 08/28/2009   Anxiety and depression 10/06/2006   Essential hypertension 10/06/2006   BREAST CANCER, HX OF 10/06/2006    ONSET DATE: 08/21/2021    REFERRING DIAG: Diagnosis I63.9 (ICD-10-CM) - Acute ischemic stroke (HCC)    THERAPY DIAG:   Unsteadiness on feet   Muscle weakness (generalized)   Other lack of coordination   Difficulty in walking, not elsewhere classified   Abnormal posture   Rationale for Evaluation and Treatment Rehabilitation   SUBJECTIVE:                                                                            SUBJECTIVE STATEMENT: Patient reports that she didn't realize she needed to pick up the AFO prescription. Monique go by Dr office to get it. Daughter is a PT and Monique probably know where to go, but patient instructed to call here if questions.   PERTINENT HISTORY: HISTORY OF PRESENT ILLNESS Monique Ballard is a 81 y.o. female with history of HTN, dementia, BRCA, migraines, alcohol dependence, anxiety and recent PFO closure to treat persistent hypoxia presenting with  acute onset expressive aphasia.  She was brought to the hospital and given TNK to treat her stroke.  Her symptoms have improved.  Patient reportedly had a similar episode of expressive aphasia about a week ago that resolved spontaneously.  She had a procedure to close a PFO in June of this year.  MRI  reveals no acute abnormality, so stroke is likely to small to be seen. She has been started on 2 months of Eliquis with ASA 49m and is to follow up with cardiology outpatient as well.    PAIN:  Are you having pain? No   PRECAUTIONS: None   WEIGHT BEARING RESTRICTIONS No   FALLS: Has patient fallen in last 6 months? Yes. Number of falls 2   LIVING ENVIRONMENT: Lives with: lives with their spouse, lives with their son, and 7 hours of personal care attendant M-F Lives in: House/apartment Stairs: Yes: External: 2 steps; can reach both Has following equipment at home: Quad cane small base, Walker - 2 wheeled, WEnvironmental consultant- 4 wheeled, Shower bench, and bed side commode   PLOF: Needs assistance with ADLs and Needs assistance with gait   PATIENT GOALS Be able to walk without the walker.   OBJECTIVE:    DIAGNOSTIC FINDINGS: DISCHARGE  DIAGNOSIS: left hemispheric stroke too small to be seen on MRI versus TIA possibly embolic from recent PFO closure -device thrombosis versus transient paroxysmal A-fib s/p Iv TNk with full recovery Principal Problem:   Acute ischemic stroke ([place] Platypnoea orthodeoxia syndrome s/p PFO closure   COGNITION: Overall cognitive status: Within functional limits for tasks assessed             SENSATION: WFL   COORDINATION: WFL U and LE   EDEMA:  None noted   MUSCLE TONE: RLE: Within functional limits     MUSCLE LENGTH: Hamstrings: Right 72 deg; Left 81 deg   POSTURE: rounded shoulders and forward head   LOWER EXTREMITY ROM:    WFL BLE, including B ankles     LOWER EXTREMITY MMT:     MMT Right Eval Left Eval  Hip flexion 4 4  Hip extension 4 4  Hip abduction 4 4  Hip adduction      Hip internal rotation      Hip external rotation      Knee flexion 4 4  Knee extension 4 4  Ankle dorsiflexion 4 2  Ankle plantarflexion   2+  Ankle inversion      Ankle eversion      (Blank rows = not tested)   BED MOBILITY:  I   TRANSFERS: Assistive device utilized: WEnvironmental consultant- 2 wheeled  Sit to stand: SBA Stand to sit: SBA Chair to chair: SBA   CURB:  Level of Assistance: Min A Assistive device utilized: WEnvironmental consultant- 2 wheeled   STAIRS:           Level of Assistance: CGA           Stair Negotiation Technique: Step to Pattern with Bilateral Rails           Number of Stairs: 2              GAIT: Gait pattern: step through pattern, decreased ankle dorsiflexion- Left, Left steppage, Left foot flat, and poor foot clearance- Left Distance walked: 80 Assistive device utilized: Walker - 2 wheeled Level of assistance: CGA Comments: Patient mildly unsteady, especially when turning.   FUNCTIONAL TESTs:  5 times sit to stand: 18.10 Timed up and go (TUG): 19.57 Functional gait assessment: TBD   PATIENT SURVEYS:  FOTO 57.59   TODAY'S TREATMENT:  10/16/21 NuStep L5 x 6  minutes Standing lateral weight shifts on airex beam with BUE support on parallel bars, then L foot only B side step x4 on Aires beam SLS in parallel bars, approx 5 sec  on R, 2 sec on L SLS mini squats to heel raise in parallel bars, 4 on L, 5 on R. Standing on Airex pad, alternately tapping each foot on 4" step, with UUE support. Occasional mild LOB. Standing hip abd, ext, flexion, 2 x 10 reps each, with 2# weights at ankles, BUE support Ambulation while holding 3# WATE bar in BUE, 2 x 75', CGA, excessive hip flexion to clear L toes, but stable.   10/11/21 Nustep L4 x7mns Walking with cane 1 big loop  Walking w/o AD Step up on airex 2HHA  Standing on airex holding 2# WaTE 30s Step up 6" from airex 2HHA at stairs  Taps on 6" standing on airex only 1 HHA Placing cones and picking them up on 6" step then did standing on airex minA  LAQ 3# 2x10 Calf raises 2x10 w/2HHA   10/08/21 NuStep L4 x 6 minutes L ankle ace wrapped into DF, then she walked with RW, with straight cane, and holding straight cane horizontally out in front with BUE, CGA. Mildly increased unsteadiness and decreased balance and sped of movement without UE support, but overall able to clear RLE.  Alternate taps on 4" step, progressed to 3 taps with each foot, CGA, no LOB, but unsteady. Pick up cone in R hand, step back/into R rotation, place the cone on a stool, return. 5 reps in each direction Place cone from R side, reach to floor x 5, then reach to floor, pick up with LUE and reach to stool. Quick side to side steps ove r ine on floor. Able to move with quick pace, 1 episode of mild LOB. Ambulation without AD, first with ace wrap, then removed. She caught her toes x 1 while still wearing Ace wrap, but no stumbles without wrap.  10/04/21 Nustep L4 x665ms Walking in // bars  Side steps in // bars  2# marches 2x10 2# hip abduction 2x10 Feet together on foam 30s Half tandem on foam 30s  Tandem on foam at best 4s  STS  w/yellow ball push out 2x10  Walking w/quad cane 2x5061f  10/02/21 Nustep L4 x6mi20m Ankle redTB 2x10 LLE PF, INV/EV LAQ 2# 2x10 BLE  Marching in walker 2#  HS curls greenTB 2x10 BLE STS 2x10, CGA  Step up on airex  Side steps on airex  Walking with quad cane 2x50ft86f/6/23 Tandem stance 2x30 sec each, 1 finger support NBOS x30 sec on foam NBOS on foam, trunk rotation holding weighted yellow ball 2x5 reps, CGA Sit to stand without UE 2x5 reps, PT cues to scoot forward in chair before standing Side stepping in // bars 2 HHA and 1 HHA x2 rounds Standing hip abduction 2# ankle weights, 2x10 reps  Standing hip flexion 2#, 2x10 reps  Gait training: using SPC x50' CGA and PT cuing to contact SPC with LLE; using SBQC x50' CGA Nustep L5x3 min  09/19/21 NuStep L5 x 5 minutes FGA-19/27 Side to side step with UE support, sit to stand wihtout UE support gait with DF A with RW, CGA, without AD, stumbled once, required mod A to recover, 100' Seated hand walk outs.     09/17/21 Education     PATIENT EDUCATION: Education details: exercise technique Person educated: Patient and Child(ren) Education method: Explanation Education comprehension: verbalized understanding     HOME EXERCISE PROGRAM:  [id number]   GOALS: Goals reviewed with patient? Yes   SHORT TERM GOALS: Target date: 10/15/2021   I with basic  HEP Baseline: Goal status: ongoing   2.  Determine whether to pursue AFO for L foot drop Baseline: pt seems content without one Goal status: ONGOING   LONG TERM GOALS: Target date: 11/26/2021   I with final HEP Baseline:  Goal status: INITIAL   2.  Patient Monique perform all functional transfers MI Baseline: CGA Goal status: INITIAL   3.  Patient Monique perform 5 x STS in < 12 sec to demonstrate improved functional strength and control Baseline: 18.10 Goal status: INITIAL   4.  Perform TUG in < 12 sec to demonstrate improved balance/decreased fall risk. Baseline:  19.57 Goal status: INITIAL   5.  Patient Monique ambulate x at least 500' functionally on indoor surfaces with LRAD, any necessary bracing for L ankle, MI Baseline: CGA, RW, foot drop, unsteady. Goal status: INITIAL   6. Increase FOTO to at least 68            Baseline 58            Goal Status INITIAL     ASSESSMENT:   CLINICAL IMPRESSION: Patient thought the Dr was going to call her for her AFO prescription. Instructed to go ahead and pick it up from Dr. Treatment focused on LE strength and NM re-ed to LLE, balance training.She tolerated increased workload today with brief rest breaks.     OBJECTIVE IMPAIRMENTS Abnormal gait, decreased activity tolerance, decreased balance, decreased mobility, difficulty walking, decreased strength, improper body mechanics, and postural dysfunction. Gait training with quad cane, patient is unstable and requires modA to help regain balance and prevent fall. Monique benefit from more gait and strength training.    ACTIVITY LIMITATIONS carrying, lifting, bending, standing, squatting, stairs, transfers, bathing, toileting, and locomotion level   PARTICIPATION LIMITATIONS: meal prep, cleaning, driving, and shopping   PERSONAL FACTORS Age and Behavior pattern are also affecting patient's functional outcome.    REHAB POTENTIAL: Good   CLINICAL DECISION MAKING: Stable/uncomplicated   EVALUATION COMPLEXITY: Moderate   PLAN: PT FREQUENCY: 2x/week   PT DURATION: 10 weeks   PLANNED INTERVENTIONS: Therapeutic exercises, Therapeutic activity, Neuromuscular re-education, Balance training, Gait training, Patient/Family education, Self Care, Joint mobilization, Stair training, and Manual therapy   PLAN FOR NEXT SESSION: balance and gait progressions     1:10 PM,10/16/21 Ethel Rana DPT 10/16/21 1:10 PM  [facility]

## 2021-10-18 ENCOUNTER — Other Ambulatory Visit (HOSPITAL_BASED_OUTPATIENT_CLINIC_OR_DEPARTMENT_OTHER): Payer: Self-pay

## 2021-10-18 ENCOUNTER — Ambulatory Visit: Payer: Medicare Other

## 2021-10-18 DIAGNOSIS — R2681 Unsteadiness on feet: Secondary | ICD-10-CM

## 2021-10-18 DIAGNOSIS — R278 Other lack of coordination: Secondary | ICD-10-CM | POA: Diagnosis not present

## 2021-10-18 DIAGNOSIS — M6281 Muscle weakness (generalized): Secondary | ICD-10-CM

## 2021-10-18 DIAGNOSIS — R293 Abnormal posture: Secondary | ICD-10-CM | POA: Diagnosis not present

## 2021-10-18 DIAGNOSIS — R262 Difficulty in walking, not elsewhere classified: Secondary | ICD-10-CM

## 2021-10-18 NOTE — Therapy (Signed)
OUTPATIENT PHYSICAL THERAPY TREATMENT NOTE   Patient Name: Monique Ballard MRN: [id number] DOB:1940-10-27, 81 y.o., female Today's Date: 10/18/2021     Past Medical History:  Diagnosis Date   Anxiety    Breast cancer ([place]) fall 1997    Left Lumpectomy, XRT.  treated with Tamoxifem and Evista   Depression    Endometriosis in her 20's   surgery to help clear endometriosis to obtain a pregnancy.   GERD (gastroesophageal reflux disease)    w/"stretching" remotely   HTN (hypertension)    Migraines    Past Surgical History:  Procedure Laterality Date   BREAST LUMPECTOMY Left fall 1997   Lymph nodes X 3 were negative, ER+, Radiation treatmetn.  Took Tamoxifem X 5 yrs then Evista for 3-5 yrs.   CESAREAN SECTION     x2  first pregnancy breach and second normal   PATENT FORAMEN OVALE(PFO) CLOSURE N/A 07/12/2021   Procedure: PATENT FORAMEN OVALE(PFO) CLOSURE;  Surgeon: Adrian Prows, MD;  Location: [facility];  Service: Cardiovascular;  Laterality: N/A;   RIGHT HEART CATH N/A 07/12/2021   Procedure: RIGHT HEART CATH;  Surgeon: Adrian Prows, MD;  Location: [facility];  Service: Cardiovascular;  Laterality: N/A;   surgery for endometriosis     Patient Active Problem List   Diagnosis Date Noted   Acute ischemic stroke ([place]) 08/20/2021   Closed fracture of lateral malleolus of right fibula 08/07/2021   Debility 07/15/2021   Platypnea-orthodeoxia syndrome    Polycythemia vera, acquired ([place])    Hypoxia 07/08/2021   Nocturnal hypoxia 07/26/2020   Alcohol dependency ([place]) 05/15/2018   Tremor 02/24/2015   Gait disorder 02/24/2015   PCP NOTES >>>>>>> 12/13/2014   Osteoporosis 10/04/2011   Annual physical exam 09/07/2010   GERD 08/28/2009   Anxiety and depression 10/06/2006   Essential hypertension 10/06/2006   BREAST CANCER, HX OF 10/06/2006    ONSET DATE: 08/21/2021    REFERRING DIAG: Diagnosis I63.9 (ICD-10-CM) - Acute ischemic stroke (HCC)    THERAPY DIAG:   Unsteadiness on feet   Muscle weakness (generalized)   Other lack of coordination   Difficulty in walking, not elsewhere classified   Abnormal posture   Rationale for Evaluation and Treatment Rehabilitation   SUBJECTIVE:                                                                            SUBJECTIVE STATEMENT: I fell last night because I tripped over my pajama pants that were too long. I was to get myself up and I didn't get hurt.    PERTINENT HISTORY: HISTORY OF PRESENT ILLNESS Ms. Monique Ballard is a 81 y.o. female with history of HTN, dementia, BRCA, migraines, alcohol dependence, anxiety and recent PFO closure to treat persistent hypoxia presenting with  acute onset expressive aphasia.  She was brought to the hospital and given TNK to treat her stroke.  Her symptoms have improved.  Patient reportedly had a similar episode of expressive aphasia about a week ago that resolved spontaneously.  She had a procedure to close a PFO in June of this year.  MRI reveals no acute abnormality, so stroke is likely to small to be seen. She  has been started on 2 months of Eliquis with ASA 64m and is to follow up with cardiology outpatient as well.    PAIN:  Are you having pain? No   PRECAUTIONS: None   WEIGHT BEARING RESTRICTIONS No   FALLS: Has patient fallen in last 6 months? Yes. Number of falls 2   LIVING ENVIRONMENT: Lives with: lives with their spouse, lives with their son, and 7 hours of personal care attendant M-F Lives in: House/apartment Stairs: Yes: External: 2 steps; can reach both Has following equipment at home: Quad cane small base, Walker - 2 wheeled, WEnvironmental consultant- 4 wheeled, Shower bench, and bed side commode   PLOF: Needs assistance with ADLs and Needs assistance with gait   PATIENT GOALS Be able to walk without the walker.   OBJECTIVE:    DIAGNOSTIC FINDINGS: DISCHARGE DIAGNOSIS: left hemispheric stroke too small to be seen on MRI versus TIA possibly embolic from  recent PFO closure -device thrombosis versus transient paroxysmal A-fib s/p Iv TNk with full recovery Principal Problem:   Acute ischemic stroke ([place] Platypnoea orthodeoxia syndrome s/p PFO closure   COGNITION: Overall cognitive status: Within functional limits for tasks assessed             SENSATION: WFL   COORDINATION: WFL U and LE   EDEMA:  None noted   MUSCLE TONE: RLE: Within functional limits     MUSCLE LENGTH: Hamstrings: Right 72 deg; Left 81 deg   POSTURE: rounded shoulders and forward head   LOWER EXTREMITY ROM:    WFL BLE, including B ankles     LOWER EXTREMITY MMT:     MMT Right Eval Left Eval  Hip flexion 4 4  Hip extension 4 4  Hip abduction 4 4  Hip adduction      Hip internal rotation      Hip external rotation      Knee flexion 4 4  Knee extension 4 4  Ankle dorsiflexion 4 2  Ankle plantarflexion   2+  Ankle inversion      Ankle eversion      (Blank rows = not tested)   BED MOBILITY:  I   TRANSFERS: Assistive device utilized: WEnvironmental consultant- 2 wheeled  Sit to stand: SBA Stand to sit: SBA Chair to chair: SBA   CURB:  Level of Assistance: Min A Assistive device utilized: WEnvironmental consultant- 2 wheeled   STAIRS:           Level of Assistance: CGA           Stair Negotiation Technique: Step to Pattern with Bilateral Rails           Number of Stairs: 2              GAIT: Gait pattern: step through pattern, decreased ankle dorsiflexion- Left, Left steppage, Left foot flat, and poor foot clearance- Left Distance walked: 80 Assistive device utilized: Walker - 2 wheeled Level of assistance: CGA Comments: Patient mildly unsteady, especially when turning.   FUNCTIONAL TESTs:  5 times sit to stand: 18.10 Timed up and go (TUG): 19.57 Functional gait assessment: TBD   PATIENT SURVEYS:  FOTO 57.59   TODAY'S TREATMENT:  10/18/21 NuStep L5 x625ms Walking w/o AD 3 laps, 1 instance of foot catching and minA to prevent tripping and falling  Side steps  w/o AD 2x305fith minA  Fitter 2x10 each leg  Standing on airex cone taps with UUE support Feet together on airex 30s  Half tandem  on airex 30s Step ups on airex   10/16/21 NuStep L5 x 6 minutes Standing lateral weight shifts on airex beam with BUE support on parallel bars, then L foot only B side step x4 on Aires beam SLS in parallel bars, approx 5 sec on R, 2 sec on L SLS mini squats to heel raise in parallel bars, 4 on L, 5 on R. Standing on Airex pad, alternately tapping each foot on 4" step, with UUE support. Occasional mild LOB. Standing hip abd, ext, flexion, 2 x 10 reps each, with 2# weights at ankles, BUE support Ambulation while holding 3# WATE bar in BUE, 2 x 75', CGA, excessive hip flexion to clear L toes, but stable.   10/11/21 Nustep L4 x19mns Walking with cane 1 big loop  Walking w/o AD Step up on airex 2HHA  Standing on airex holding 2# WaTE 30s Step up 6" from airex 2HHA at stairs  Taps on 6" standing on airex only 1 HHA Placing cones and picking them up on 6" step then did standing on airex minA  LAQ 3# 2x10 Calf raises 2x10 w/2HHA   10/08/21 NuStep L4 x 6 minutes L ankle ace wrapped into DF, then she walked with RW, with straight cane, and holding straight cane horizontally out in front with BUE, CGA. Mildly increased unsteadiness and decreased balance and sped of movement without UE support, but overall able to clear RLE.  Alternate taps on 4" step, progressed to 3 taps with each foot, CGA, no LOB, but unsteady. Pick up cone in R hand, step back/into R rotation, place the cone on a stool, return. 5 reps in each direction Place cone from R side, reach to floor x 5, then reach to floor, pick up with LUE and reach to stool. Quick side to side steps ove r ine on floor. Able to move with quick pace, 1 episode of mild LOB. Ambulation without AD, first with ace wrap, then removed. She caught her toes x 1 while still wearing Ace wrap, but no stumbles without  wrap.  10/04/21 Nustep L4 x666ms Walking in // bars  Side steps in // bars  2# marches 2x10 2# hip abduction 2x10 Feet together on foam 30s Half tandem on foam 30s  Tandem on foam at best 4s  STS w/yellow ball push out 2x10  Walking w/quad cane 2x5036f  10/02/21 Nustep L4 x6mi53m Ankle redTB 2x10 LLE PF, INV/EV LAQ 2# 2x10 BLE  Marching in walker 2#  HS curls greenTB 2x10 BLE STS 2x10, CGA  Step up on airex  Side steps on airex  Walking with quad cane 2x50ft24f/6/23 Tandem stance 2x30 sec each, 1 finger support NBOS x30 sec on foam NBOS on foam, trunk rotation holding weighted yellow ball 2x5 reps, CGA Sit to stand without UE 2x5 reps, PT cues to scoot forward in chair before standing Side stepping in // bars 2 HHA and 1 HHA x2 rounds Standing hip abduction 2# ankle weights, 2x10 reps  Standing hip flexion 2#, 2x10 reps  Gait training: using SPC x50' CGA and PT cuing to contact SPC with LLE; using SBQC x50' CGA Nustep L5x3 min  09/19/21 NuStep L5 x 5 minutes FGA-19/27 Side to side step with UE support, sit to stand wihtout UE support gait with DF A with RW, CGA, without AD, stumbled once, required mod A to recover, 100' Seated hand walk outs.     09/17/21 Education     PATIENT EDUCATION:  Education details: exercise technique Person educated: Patient and Child(ren) Education method: Explanation Education comprehension: verbalized understanding     HOME EXERCISE PROGRAM:  [id number]   GOALS: Goals reviewed with patient? Yes   SHORT TERM GOALS: Target date: 10/15/2021   I with basic HEP Baseline: Goal status: MET   2.  Determine whether to pursue AFO for L foot drop Baseline: pt seems content without one Goal status: ONGOING   LONG TERM GOALS: Target date: 11/26/2021   I with final HEP Baseline:  Goal status: INITIAL   2.  Patient will perform all functional transfers MI Baseline: CGA Goal status: INITIAL   3.  Patient will perform 5 x STS in <  12 sec to demonstrate improved functional strength and control Baseline: 18.10 Goal status: INITIAL   4.  Perform TUG in < 12 sec to demonstrate improved balance/decreased fall risk. Baseline: 19.57 Goal status: INITIAL   5.  Patient will ambulate x at least 500' functionally on indoor surfaces with LRAD, any necessary bracing for L ankle, MI Baseline: CGA, RW, foot drop, unsteady. Goal status: INITIAL   6. Increase FOTO to at least 68            Baseline 58            Goal Status INITIAL     ASSESSMENT:   CLINICAL IMPRESSION: Patient states that she called her doctor and they did not have her AFO. Was instructed to call Dr[facility]Sethi at [facility] since her is the one who sent the referral and order in 5 days ago. She said she will call him and try to go there after her session today. Treatment focused on LE strength and NM re-ed to LLE, balance training. Patient needs cues to slow down when walking and to pick up L foot to prevent herself from tripping. Feels unsteady with side steps and unsafe but able to do with minA from therapist. Requires support with activities on airex, mostly with cone taps. Able to get into feet together and tandem stance with help and can hold without support for 30s.      OBJECTIVE IMPAIRMENTS Abnormal gait, decreased activity tolerance, decreased balance, decreased mobility, difficulty walking, decreased strength, improper body mechanics, and postural dysfunction. Gait training with quad cane, patient is unstable and requires modA to help regain balance and prevent fall. Will benefit from more gait and strength training.    ACTIVITY LIMITATIONS carrying, lifting, bending, standing, squatting, stairs, transfers, bathing, toileting, and locomotion level   PARTICIPATION LIMITATIONS: meal prep, cleaning, driving, and shopping   PERSONAL FACTORS Age and Behavior pattern are also affecting patient's functional outcome.    REHAB POTENTIAL: Good   CLINICAL DECISION  MAKING: Stable/uncomplicated   EVALUATION COMPLEXITY: Moderate   PLAN: PT FREQUENCY: 2x/week   PT DURATION: 10 weeks   PLANNED INTERVENTIONS: Therapeutic exercises, Therapeutic activity, Neuromuscular re-education, Balance training, Gait training, Patient/Family education, Self Care, Joint mobilization, Stair training, and Manual therapy   PLAN FOR NEXT SESSION: balance and gait progressions     1:11 PM,10/18/21 Ethel Rana DPT 10/18/21 1:11 PM  [place] at [facility]

## 2021-10-30 NOTE — Therapy (Signed)
OUTPATIENT PHYSICAL THERAPY TREATMENT NOTE   Patient Name: Monique Ballard MRN: [id number] DOB:20-Apr-1940, 81 y.o., female Today's Date: 11/01/2021  Progress Note Reporting Period 09/17/21 to 10/31/21  See note below for Objective Data and Assessment of Progress/Goals.      END OF SESSION:   PT End of Session - 11/01/21 0647     Visit Number 10    Date for PT Re-Evaluation 11/26/21    Progress Note Due on Visit 20    PT Start Time 1146    PT Stop Time 1225    PT Time Calculation (min) 39 min    Equipment Utilized During Treatment Gait belt    Activity Tolerance Patient tolerated treatment well;No increased pain    Behavior During Therapy WFL for tasks assessed/performed             Past Medical History:  Diagnosis Date   Anxiety    Breast cancer ([place]) fall 1997    Left Lumpectomy, XRT.  treated with Tamoxifem and Evista   Depression    Endometriosis in her 20's   surgery to help clear endometriosis to obtain a pregnancy.   GERD (gastroesophageal reflux disease)    w/"stretching" remotely   HTN (hypertension)    Migraines    Past Surgical History:  Procedure Laterality Date   BREAST LUMPECTOMY Left fall 1997   Lymph nodes X 3 were negative, ER+, Radiation treatmetn.  Took Tamoxifem X 5 yrs then Evista for 3-5 yrs.   CESAREAN SECTION     x2  first pregnancy breach and second normal   PATENT FORAMEN OVALE(PFO) CLOSURE N/A 07/12/2021   Procedure: PATENT FORAMEN OVALE(PFO) CLOSURE;  Surgeon: Adrian Prows, MD;  Location: [facility];  Service: Cardiovascular;  Laterality: N/A;   RIGHT HEART CATH N/A 07/12/2021   Procedure: RIGHT HEART CATH;  Surgeon: Adrian Prows, MD;  Location: [facility];  Service: Cardiovascular;  Laterality: N/A;   surgery for endometriosis     Patient Active Problem List   Diagnosis Date Noted   Acute ischemic stroke ([place]) 08/20/2021   Closed fracture of lateral malleolus of right fibula 08/07/2021   Debility 07/15/2021    Platypnea-orthodeoxia syndrome    Polycythemia vera, acquired ([place])    Hypoxia 07/08/2021   Nocturnal hypoxia 07/26/2020   Alcohol dependency ([place]) 05/15/2018   Tremor 02/24/2015   Gait disorder 02/24/2015   PCP NOTES >>>>>>> 12/13/2014   Osteoporosis 10/04/2011   Annual physical exam 09/07/2010   GERD 08/28/2009   Anxiety and depression 10/06/2006   Essential hypertension 10/06/2006   BREAST CANCER, HX OF 10/06/2006    ONSET DATE: 08/21/2021    REFERRING DIAG: Diagnosis I63.9 (ICD-10-CM) - Acute ischemic stroke (HCC)    THERAPY DIAG:  Unsteadiness on feet   Muscle weakness (generalized)   Other lack of coordination   Difficulty in walking, not elsewhere classified   Abnormal posture   Rationale for Evaluation and Treatment Rehabilitation   SUBJECTIVE:                                                                            SUBJECTIVE STATEMENT: Pt states things are going well. She has her appointment for her AFO on  Friday.   PERTINENT HISTORY: HISTORY OF PRESENT ILLNESS Ms. Monique Ballard is a 81 y.o. female with history of HTN, dementia, BRCA, migraines, alcohol dependence, anxiety and recent PFO closure to treat persistent hypoxia presenting with  acute onset expressive aphasia.  She was brought to the hospital and given TNK to treat her stroke.  Her symptoms have improved.  Patient reportedly had a similar episode of expressive aphasia about a week ago that resolved spontaneously.  She had a procedure to close a PFO in June of this year.  MRI reveals no acute abnormality, so stroke is likely to small to be seen. She has been started on 2 months of Eliquis with ASA 37m and is to follow up with cardiology outpatient as well.    PAIN:  Are you having pain? No   PRECAUTIONS: None   WEIGHT BEARING RESTRICTIONS No   FALLS: Has patient fallen in last 6 months? Yes. Number of falls 2   LIVING ENVIRONMENT: Lives with: lives with their spouse, lives with their son,  and 7 hours of personal care attendant M-F Lives in: House/apartment Stairs: Yes: External: 2 steps; can reach both Has following equipment at home: Quad cane small base, Walker - 2 wheeled, WEnvironmental consultant- 4 wheeled, Shower bench, and bed side commode   PLOF: Needs assistance with ADLs and Needs assistance with gait   PATIENT GOALS Be able to walk without the walker.   OBJECTIVE:    DIAGNOSTIC FINDINGS: DISCHARGE DIAGNOSIS: left hemispheric stroke too small to be seen on MRI versus TIA possibly embolic from recent PFO closure -device thrombosis versus transient paroxysmal A-fib s/p Iv TNk with full recovery Principal Problem:   Acute ischemic stroke (HCC) Platypnoea orthodeoxia syndrome s/p PFO closure   COGNITION: Overall cognitive status: Within functional limits for tasks assessed             SENSATION: WFL   COORDINATION: WFL U and LE   EDEMA:  None noted   MUSCLE TONE: RLE: Within functional limits     MUSCLE LENGTH: Hamstrings: Right 72 deg; Left 81 deg   POSTURE: rounded shoulders and forward head   LOWER EXTREMITY ROM:    WFL BLE, including B ankles     LOWER EXTREMITY MMT:     MMT Right Eval Left Eval  Hip flexion 4 4  Hip extension 4 4  Hip abduction 4 4  Hip adduction      Hip internal rotation      Hip external rotation      Knee flexion 4 4  Knee extension 4 4  Ankle dorsiflexion 4 2  Ankle plantarflexion   2+  Ankle inversion      Ankle eversion      (Blank rows = not tested)   BED MOBILITY:  I   TRANSFERS: Assistive device utilized: WEnvironmental consultant- 2 wheeled  Sit to stand: SBA Stand to sit: SBA Chair to chair: SBA   CURB:  Level of Assistance: Min A Assistive device utilized: WEnvironmental consultant- 2 wheeled   STAIRS:           Level of Assistance: CGA           Stair Negotiation Technique: Step to Pattern with Bilateral Rails           Number of Stairs: 2              GAIT: Gait pattern: step through pattern, decreased ankle dorsiflexion- Left, Left  steppage, Left foot flat, and  poor foot clearance- Left Distance walked: 80 with walker, 46f with SPC Assistive device utilized: Walker - 2 wheeled Level of assistance: CGA Comments: Pt had 1 near LOB with SPC but was able to recover independently. PT provided CGA for safety. Pt has improved coordination with cane placement    FUNCTIONAL TESTs:  5 times sit to stand: 9 sec (10/31/21) Timed up and go (TUG): 19.57 Functional gait assessment: TBD   PATIENT SURVEYS:  FOTO 78 (10/31/21)   TODAY'S TREATMENT:  10/31/21 Tandem walking on airex pad in // bars x4 reps CGA Tandem stance, firm surface, 2x30 sec CGA Feet together airex 2x30 sec EO, x30 sec EC CGA Weight shifting on trampoline intermittent UE assist, CGA x30 sec each Sit to stand holding blue weighted ball 3x5 reps  Sidestepping with yellow TB around feet in // bars down/back x4 Nustep L5 x5 min Ambulation SPC [id number], PT cuing for proper SPC placement initially   10/18/21 NuStep L5 x6m67m Walking w/o AD 3 laps, 1 instance of foot catching and minA to prevent tripping and falling  Side steps w/o AD 2x30f48fth minA  Fitter 2x10 each leg  Standing on airex cone taps with UUE support Feet together on airex 30s  Half tandem on airex 30s Step ups on airex    10/16/21 NuStep L5 x 6 minutes Standing lateral weight shifts on airex beam with BUE support on parallel bars, then L foot only B side step x4 on [place] beam SLS in parallel bars, approx 5 sec on R, 2 sec on L SLS mini squats to heel raise in parallel bars, 4 on L, 5 on R. Standing on Airex pad, alternately tapping each foot on 4" step, with UUE support. Occasional mild LOB. Standing hip abd, ext, flexion, 2 x 10 reps each, with 2# weights at ankles, BUE support Ambulation while holding 3# WATE bar in BUE, 2 x 75', CGA, excessive hip flexion to clear L toes, but stable.     10/11/21 Nustep L4 x6min47malking with cane 1 big loop  Walking w/o AD Step up on airex  2HHA  Standing on airex holding 2# WaTE 30s Step up 6" from airex 2HHA at stairs  Taps on 6" standing on airex only 1 HHA Placing cones and picking them up on 6" step then did standing on airex minA  LAQ 3# 2x10 Calf raises 2x10 w/2HHA            PATIENT EDUCATION: Education details: exercise technique Person educated: Patient  Education method: Explanation Education comprehension: verbalized understanding     HOME EXERCISE PROGRAM:  [id number]   GOALS: Goals reviewed with patient? Yes   SHORT TERM GOALS: Target date: 10/15/2021   I with basic HEP Baseline: Goal status: MET   2.  Determine whether to pursue AFO for L foot drop Baseline: pt seems content without one Goal status: ONGOING   LONG TERM GOALS: Target date: 11/26/2021   I with final HEP Baseline:  Goal status: INITIAL   2.  Patient will perform all functional transfers MI Baseline: CGA Goal status: INITIAL   3.  Patient will perform 5 x STS in < 12 sec to demonstrate improved functional strength and control Baseline: 9 sec Goal status: MET   4.  Perform TUG in < 12 sec to demonstrate improved balance/decreased fall risk. Baseline: 19.57 Goal status: INITIAL   5.  Patient will ambulate x at least 500' functionally on indoor surfaces with LRAD, any necessary bracing for  L ankle, MI Baseline: CGA, RW, foot drop, unsteady. Goal status: INITIAL   6. Increase FOTO to at least 68            Baseline 78            Goal Status MET     ASSESSMENT:   CLINICAL IMPRESSION: Pt is making good progress towards her long term goals, meeting 2 goals today. Her balance is improving, requiring only CGA during all of today's static and dynamic balance activities. She is still eager to work towards ambulating with a cane. We worked on this at the end of today's session and she required Foley for safety and Min verbal cues for proper placement. Pt had 1 near LOB due to her Lt foot drop, but she was able to recover with  CGA. Pt would continue to benefit from skilled PT to further promote her independence and safety with daily activity.     OBJECTIVE IMPAIRMENTS Abnormal gait, decreased activity tolerance, decreased balance, decreased mobility, difficulty walking, decreased strength, improper body mechanics, and postural dysfunction. Gait training with quad cane, patient is unstable and requires modA to help regain balance and prevent fall. Will benefit from more gait and strength training.    ACTIVITY LIMITATIONS carrying, lifting, bending, standing, squatting, stairs, transfers, bathing, toileting, and locomotion level   PARTICIPATION LIMITATIONS: meal prep, cleaning, driving, and shopping   PERSONAL FACTORS Age and Behavior pattern are also affecting patient's functional outcome.    REHAB POTENTIAL: Good   CLINICAL DECISION MAKING: Stable/uncomplicated   EVALUATION COMPLEXITY: Moderate   PLAN: PT FREQUENCY: 2x/week   PT DURATION: 10 weeks   PLANNED INTERVENTIONS: Therapeutic exercises, Therapeutic activity, Neuromuscular re-education, Balance training, Gait training, Patient/Family education, Self Care, Joint mobilization, Stair training, and Manual therapy   PLAN FOR NEXT SESSION: balance and gait progressions-pt would like to work toward ambulation with a [facility]   6:49 AM,11/01/21 Sherol Dade PT, [facility] [place]

## 2021-10-31 ENCOUNTER — Ambulatory Visit: Payer: Medicare Other | Attending: Neurology | Admitting: Physical Therapy

## 2021-10-31 DIAGNOSIS — R278 Other lack of coordination: Secondary | ICD-10-CM | POA: Diagnosis not present

## 2021-10-31 DIAGNOSIS — R2681 Unsteadiness on feet: Secondary | ICD-10-CM | POA: Insufficient documentation

## 2021-10-31 DIAGNOSIS — R293 Abnormal posture: Secondary | ICD-10-CM | POA: Diagnosis not present

## 2021-10-31 DIAGNOSIS — R262 Difficulty in walking, not elsewhere classified: Secondary | ICD-10-CM | POA: Insufficient documentation

## 2021-10-31 DIAGNOSIS — M6281 Muscle weakness (generalized): Secondary | ICD-10-CM | POA: Insufficient documentation

## 2021-10-31 DIAGNOSIS — R2689 Other abnormalities of gait and mobility: Secondary | ICD-10-CM | POA: Diagnosis not present

## 2021-11-02 ENCOUNTER — Ambulatory Visit: Payer: Medicare Other

## 2021-11-02 ENCOUNTER — Encounter: Payer: Self-pay | Admitting: Internal Medicine

## 2021-11-02 ENCOUNTER — Ambulatory Visit (INDEPENDENT_AMBULATORY_CARE_PROVIDER_SITE_OTHER): Payer: Medicare Other | Admitting: Internal Medicine

## 2021-11-02 VITALS — BP 126/72 | HR 62 | Temp 98.1°F | Resp 16 | Ht 63.0 in | Wt 124.5 lb

## 2021-11-02 DIAGNOSIS — Z8673 Personal history of transient ischemic attack (TIA), and cerebral infarction without residual deficits: Secondary | ICD-10-CM | POA: Diagnosis not present

## 2021-11-02 DIAGNOSIS — Z4509 Encounter for adjustment and management of other cardiac device: Secondary | ICD-10-CM | POA: Diagnosis not present

## 2021-11-02 DIAGNOSIS — I639 Cerebral infarction, unspecified: Secondary | ICD-10-CM | POA: Diagnosis not present

## 2021-11-02 DIAGNOSIS — F32A Depression, unspecified: Secondary | ICD-10-CM

## 2021-11-02 DIAGNOSIS — Z95818 Presence of other cardiac implants and grafts: Secondary | ICD-10-CM | POA: Diagnosis not present

## 2021-11-02 DIAGNOSIS — F419 Anxiety disorder, unspecified: Secondary | ICD-10-CM | POA: Diagnosis not present

## 2021-11-02 DIAGNOSIS — S8261XA Displaced fracture of lateral malleolus of right fibula, initial encounter for closed fracture: Secondary | ICD-10-CM | POA: Diagnosis not present

## 2021-11-02 DIAGNOSIS — Z1231 Encounter for screening mammogram for malignant neoplasm of breast: Secondary | ICD-10-CM | POA: Diagnosis not present

## 2021-11-02 DIAGNOSIS — Q2112 Patent foramen ovale: Secondary | ICD-10-CM

## 2021-11-02 DIAGNOSIS — Z23 Encounter for immunization: Secondary | ICD-10-CM

## 2021-11-02 NOTE — Patient Instructions (Addendum)
Vaccines I recommend:  Shingrix (shingles) Covid booster RSV vaccine  Check the  blood pressure regularly BP GOAL is between 110/65 and  135/85. If it is consistently higher or lower, let me know     GO TO THE FRONT DESK, PLEASE SCHEDULE YOUR APPOINTMENTS Come back for checkup in 3 to 4 months

## 2021-11-02 NOTE — Progress Notes (Unsigned)
Subjective:    Patient ID: Monique Ballard, female    DOB: [birth year], 81 y.o.   MRN: [id number]  DOS:  11/02/2021 Type of visit - description: ROV  Here with her daughter [name]. Since the last visit, she presented to the ER, admitted to the hospital, diagnosed with a stroke.  Since then she is doing well. No EtOH She is making progress. She feels much better physically and mentally. No recent falls.  Review of Systems See above   Past Medical History:  Diagnosis Date   Anxiety    Breast cancer ([place]) fall 1997    Left Lumpectomy, XRT.  treated with Tamoxifem and Evista   Depression    Endometriosis in her 20's   surgery to help clear endometriosis to obtain a pregnancy.   GERD (gastroesophageal reflux disease)    w/"stretching" remotely   HTN (hypertension)    Migraines     Past Surgical History:  Procedure Laterality Date   BREAST LUMPECTOMY Left fall 1997   Lymph nodes X 3 were negative, ER+, Radiation treatmetn.  Took Tamoxifem X 5 yrs then Evista for 3-5 yrs.   CESAREAN SECTION     x2  first pregnancy breach and second normal   PATENT FORAMEN OVALE(PFO) CLOSURE N/A 07/12/2021   Procedure: PATENT FORAMEN OVALE(PFO) CLOSURE;  Surgeon: Adrian Prows, MD;  Location: [facility];  Service: Cardiovascular;  Laterality: N/A;   RIGHT HEART CATH N/A 07/12/2021   Procedure: RIGHT HEART CATH;  Surgeon: Adrian Prows, MD;  Location: [facility];  Service: Cardiovascular;  Laterality: N/A;   surgery for endometriosis      Current Outpatient Medications  Medication Instructions   apixaban (ELIQUIS) 5 mg, Oral, 2 times daily   aspirin EC 81 mg, Oral, Daily, Swallow whole.   escitalopram (LEXAPRO) 20 mg, Oral, Daily   folic acid (FOLVITE) 1 mg, Oral, Daily   metoprolol tartrate (LOPRESSOR) 12.5 mg, Oral, 2 times daily   Multiple Vitamin (MULTIVITAMIN WITH MINERALS) TABS tablet 1 tablet, Oral, Daily   OLANZapine (ZYPREXA) 7.5 MG tablet TAKE ONE TABLET BY MOUTH DAILY  AT BEDTIME   pantoprazole (PROTONIX) 40 mg, Oral, Daily   rosuvastatin (CRESTOR) 20 mg, Oral, Daily   thiamine (VITAMIN B1) 100 mg, Oral, Daily       Objective:   Physical Exam BP 126/72   Pulse 62   Temp 98.1 F (36.7 C) (Oral)   Resp 16   Ht '5\' 3"'$  (1.6 m)   Wt 124 lb 8 oz (56.5 kg)   LMP 01/22/1992 (Within Months)   SpO2 99%   BMI 22.05 kg/m  General:   Well developed, NAD, BMI noted. HEENT:  Normocephalic . Face symmetric, atraumatic Lungs:  CTA B Normal respiratory effort, no intercostal retractions, no accessory muscle use. Heart: RRR,  no murmur.  Lower extremities: no pretibial edema bilaterally  Skin: Not pale. Not jaundice Neurologic:  alert & oriented X3.  Speech normal, gait appropriate for age and assisted by walker psych--  Cognition and judgment appear intact.  Cooperative with normal attention span and concentration.  Behavior appropriate. No anxious or depressed appearing.      Assessment      Assessment HTN Depression, Anxiety - Dr Clovis Pu  Tremor dx [phone] Gait d/o: unsteady onset ~ 2017 GERD with esophageal stricture L Breast cancer, 1997, lumpectomy, XRT, released from oncology ETOH Stroke per MRI brain (2021), rx ASA per neuro Fatty liver per [place]  03/07/2020 Hypoxemia: Hypoxemia: Found on  admission 09/30/2019.  Resolved after PFO closure   Cryptogenic stroke   PLAN: Here with her daughter [name] Cryptogenic stroke,  Since I last saw her was admitted to hospital and discharge 08-22-21. Final diagnosis was L hemispheric stroke, too small to be seen on MRI versus TIA, possibly embolic due to previous/recent PFO closure or paroxysmal A-fib. Previously on aspirin Plavix, currently on aspirin and Eliquis. She seems to be doing great at this point. Daughter reports she has a loop recorder in place. PFO closure: Last visit with cardiology 10/10/2021, no changes recommended. Preventive care reviewed and updated.  See separate documentation Labs  reviewed, no need for blood work today Social: Lives at home with her husband and son.  She has somebody help her to shower twice a week RTC 3 to 4 months routine checkup    Td  2020 [facility] 2011, prevnar 2016 PNM 20: Today RSV d/w pt shingles shot 2011 Shingrix d/w pt  Covid vax: Recommended Flu shot toay Colonoscopy:    11/22/2002, May 2015, negative, 10 years ([facility]) Cervical ca screening: no h/o abnormal paps, married once, pt elected no further paps  MMG ordered, okay to stop after this mammogram H- POA - documents filled

## 2021-11-03 ENCOUNTER — Encounter: Payer: Self-pay | Admitting: Cardiology

## 2021-11-04 DIAGNOSIS — Q2112 Patent foramen ovale: Secondary | ICD-10-CM | POA: Insufficient documentation

## 2021-11-04 NOTE — Assessment & Plan Note (Addendum)
Td  2020 PNM 2011, prevnar 2016 PNM 20: Today RSV d/w pt shingles shot 2011 Shingrix d/w pt  Covid vax: Recommended Flu shot toay Colonoscopy:    11/22/2002, May 2015, negative, 10 years ([facility]) Cervical ca screening: no h/o abnormal paps, married once, pt elected no further paps  MMG ordered, okay to stop after this mammogram H- POA - documents filled

## 2021-11-04 NOTE — Assessment & Plan Note (Addendum)
Here with her daughter Monique Ballard Cryptogenic stroke,  Since I last saw her was admitted to hospital and discharge 08-22-21. Final diagnosis was L hemispheric stroke, too small to be seen on MRI versus TIA, possibly embolic due to previous/recent PFO closure or paroxysmal A-fib. Previously on aspirin Plavix, currently on aspirin and Eliquis. She seems to be doing great at this point. Daughter reports she has a loop recorder in place. PFO closure: Last visit with cardiology 10/10/2021, no changes recommended. Anxiety depression: Doing well on Lexapro H/o ETOH: not drinking ar all  Preventive care reviewed and updated.  See separate documentation Labs reviewed, no need for blood work today Social: Lives at home with her husband and son.  She has somebody help her to shower twice a week RTC 3 to 4 months routine checkup

## 2021-11-05 ENCOUNTER — Ambulatory Visit (HOSPITAL_BASED_OUTPATIENT_CLINIC_OR_DEPARTMENT_OTHER)
Admission: RE | Admit: 2021-11-05 | Discharge: 2021-11-05 | Disposition: A | Discharge status: Home / Self Care | Payer: Medicare Other | Source: Ambulatory Visit | Attending: Internal Medicine | Admitting: Internal Medicine

## 2021-11-05 ENCOUNTER — Encounter (HOSPITAL_BASED_OUTPATIENT_CLINIC_OR_DEPARTMENT_OTHER): Payer: Self-pay

## 2021-11-05 DIAGNOSIS — Z1231 Encounter for screening mammogram for malignant neoplasm of breast: Secondary | ICD-10-CM | POA: Insufficient documentation

## 2021-11-05 NOTE — Therapy (Signed)
OUTPATIENT PHYSICAL THERAPY TREATMENT NOTE   Patient Name: Monique Ballard MRN: [id number] DOB:[birth year], 81 y.o., female Today's Date: 11/06/2021    END OF SESSION:   PT End of Session - 11/06/21 1233     Visit Number 11    Date for PT Re-Evaluation 11/26/21    Progress Note Due on Visit 20    PT Start Time 1230    PT Stop Time 1315    PT Time Calculation (min) 45 min    Equipment Utilized During Treatment Gait belt    Activity Tolerance Patient tolerated treatment well;No increased pain    Behavior During Therapy WFL for tasks assessed/performed              Past Medical History:  Diagnosis Date   Anxiety    Breast cancer ([place]) fall 1997    Left Lumpectomy, XRT.  treated with Tamoxifem and Evista   Cryptogenic stroke ([place]) 08/20/2021   Depression    Endometriosis in her 20's   surgery to help clear endometriosis to obtain a pregnancy.   GERD (gastroesophageal reflux disease)    w/"stretching" remotely   HTN (hypertension)    Loop recorder Biotronik Biomonitor III 10/03/2021 10/03/2021   Migraines    Past Surgical History:  Procedure Laterality Date   BREAST LUMPECTOMY Left fall 1997   Lymph nodes X 3 were negative, ER+, Radiation treatmetn.  Took Tamoxifem X 5 yrs then Evista for 3-5 yrs.   CESAREAN SECTION     x2  first pregnancy breach and second normal   PATENT FORAMEN OVALE(PFO) CLOSURE N/A 07/12/2021   Procedure: PATENT FORAMEN OVALE(PFO) CLOSURE;  Surgeon: Adrian Prows, MD;  Location: [facility];  Service: Cardiovascular;  Laterality: N/A;   RIGHT HEART CATH N/A 07/12/2021   Procedure: RIGHT HEART CATH;  Surgeon: Adrian Prows, MD;  Location: [facility];  Service: Cardiovascular;  Laterality: N/A;   surgery for endometriosis     Patient Active Problem List   Diagnosis Date Noted   PFO s/p closure 11/04/2021   Loop recorder Biotronik Biomonitor III 10/03/2021 10/03/2021   Cryptogenic stroke ([place]) 08/20/2021   Closed fracture of lateral  malleolus of right fibula 08/07/2021   Debility 07/15/2021   Platypnea-orthodeoxia syndrome    Polycythemia vera, acquired ([place])    Nocturnal hypoxia 07/26/2020   Alcohol dependency ([place]) 05/15/2018   Tremor 02/24/2015   Gait disorder 02/24/2015   PCP NOTES >>>>>>> 12/13/2014   Osteoporosis 10/04/2011   Annual physical exam 09/07/2010   GERD 08/28/2009   Anxiety and depression 10/06/2006   Essential hypertension 10/06/2006   BREAST CANCER, HX OF 10/06/2006    ONSET DATE: 08/21/2021    REFERRING DIAG: Diagnosis I63.9 (ICD-10-CM) - Acute ischemic stroke (HCC)    THERAPY DIAG:  Unsteadiness on feet   Muscle weakness (generalized)   Other lack of coordination   Difficulty in walking, not elsewhere classified   Abnormal posture   Rationale for Evaluation and Treatment Rehabilitation   SUBJECTIVE:                                                                            SUBJECTIVE STATEMENT: I am doing fine, have not had any falls.  I got fitted for the AFO and will pick up on 11/3.    PERTINENT HISTORY: HISTORY OF PRESENT ILLNESS Ms. Monique Ballard is a 81 y.o. female with history of HTN, dementia, BRCA, migraines, alcohol dependence, anxiety and recent PFO closure to treat persistent hypoxia presenting with  acute onset expressive aphasia.  She was brought to the hospital and given TNK to treat her stroke.  Her symptoms have improved.  Patient reportedly had a similar episode of expressive aphasia about a week ago that resolved spontaneously.  She had a procedure to close a PFO in June of this year.  MRI reveals no acute abnormality, so stroke is likely to small to be seen. She has been started on 2 months of Eliquis with ASA 36m and is to follow up with cardiology outpatient as well.    PAIN:  Are you having pain? No   PRECAUTIONS: None   WEIGHT BEARING RESTRICTIONS No   FALLS: Has patient fallen in last 6 months? Yes. Number of falls 2   LIVING ENVIRONMENT: Lives  with: lives with their spouse, lives with their son, and 7 hours of personal care attendant M-F Lives in: House/apartment Stairs: Yes: External: 2 steps; can reach both Has following equipment at home: Quad cane small base, Walker - 2 wheeled, WEnvironmental consultant- 4 wheeled, Shower bench, and bed side commode   PLOF: Needs assistance with ADLs and Needs assistance with gait   PATIENT GOALS Be able to walk without the walker.   OBJECTIVE:    DIAGNOSTIC FINDINGS: DISCHARGE DIAGNOSIS: left hemispheric stroke too small to be seen on MRI versus TIA possibly embolic from recent PFO closure -device thrombosis versus transient paroxysmal A-fib s/p Iv TNk with full recovery Principal Problem:   Acute ischemic stroke (HCC) Platypnoea orthodeoxia syndrome s/p PFO closure   COGNITION: Overall cognitive status: Within functional limits for tasks assessed             SENSATION: WFL   COORDINATION: WFL U and LE   EDEMA:  None noted   MUSCLE TONE: RLE: Within functional limits     MUSCLE LENGTH: Hamstrings: Right 72 deg; Left 81 deg   POSTURE: rounded shoulders and forward head   LOWER EXTREMITY ROM:    WFL BLE, including B ankles     LOWER EXTREMITY MMT:     MMT Right Eval Left Eval  Hip flexion 4 4  Hip extension 4 4  Hip abduction 4 4  Hip adduction      Hip internal rotation      Hip external rotation      Knee flexion 4 4  Knee extension 4 4  Ankle dorsiflexion 4 2  Ankle plantarflexion   2+  Ankle inversion      Ankle eversion      (Blank rows = not tested)   BED MOBILITY:  I   TRANSFERS: Assistive device utilized: WEnvironmental consultant- 2 wheeled  Sit to stand: SBA Stand to sit: SBA Chair to chair: SBA   CURB:  Level of Assistance: Min A Assistive device utilized: WEnvironmental consultant- 2 wheeled   STAIRS:           Level of Assistance: CGA           Stair Negotiation Technique: Step to Pattern with Bilateral Rails           Number of Stairs: 2              GAIT: Gait pattern: step  through pattern, decreased ankle dorsiflexion- Left, Left steppage, Left foot flat, and poor foot clearance- Left Distance walked: 80 with walker, 84f with SPC Assistive device utilized: Walker - 2 wheeled Level of assistance: CGA Comments: Pt had 1 near LOB with SPC but was able to recover independently. PT provided CGA for safety. Pt has improved coordination with cane placement    FUNCTIONAL TESTs:  5 times sit to stand: 9 sec (10/31/21) Timed up and go (TUG): 19.57 Functional gait assessment: TBD   PATIENT SURVEYS:  FOTO 78 (10/31/21)   TODAY'S TREATMENT:  11/06/21 NuStep L5 x645ms  Step ups 6" x10 each leg S2S on airex 2x10  Walking with quad cane Leg ext 10# 2x10 HS curls 20# 2x10  10/31/21 Tandem walking on airex pad in // bars x4 reps CGA Tandem stance, firm surface, 2x30 sec CGA Feet together airex 2x30 sec EO, x30 sec EC CGA Weight shifting on trampoline intermittent UE assist, CGA x30 sec each Sit to stand holding blue weighted ball 3x5 reps  Sidestepping with yellow TB around feet in // bars down/back x4 Nustep L5 x5 min Ambulation SPC [id number], PT cuing for proper SPC placement initially   10/18/21 NuStep L5 x6mi69mWalking w/o AD 3 laps, 1 instance of foot catching and minA to prevent tripping and falling  Side steps w/o AD 2x30ft47fh minA  Fitter 2x10 each leg  Standing on airex cone taps with UUE support Feet together on airex 30s  Half tandem on airex 30s Step ups on airex    10/16/21 NuStep L5 x 6 minutes Standing lateral weight shifts on airex beam with BUE support on parallel bars, then L foot only B side step x4 on Aires beam SLS in parallel bars, approx 5 sec on R, 2 sec on L SLS mini squats to heel raise in parallel bars, 4 on L, 5 on R. Standing on Airex pad, alternately tapping each foot on 4" step, with UUE support. Occasional mild LOB. Standing hip abd, ext, flexion, 2 x 10 reps each, with 2# weights at ankles, BUE support Ambulation  while holding 3# WATE bar in BUE, 2 x 75', CGA, excessive hip flexion to clear L toes, but stable.     10/11/21 Nustep L4 x6mins57mlking with cane 1 big loop  Walking w/o AD Step up on airex 2HHA  Standing on airex holding 2# WaTE 30s Step up 6" from airex 2HHA at stairs  Taps on 6" standing on airex only 1 HHA Placing cones and picking them up on 6" step then did standing on airex minA  LAQ 3# 2x10 Calf raises 2x10 w/2HHA            PATIENT EDUCATION: Education details: exercise technique Person educated: Patient  Education method: Explanation Education comprehension: verbalized understanding     HOME EXERCISE PROGRAM:  [id number]   GOALS: Goals reviewed with patient? Yes   SHORT TERM GOALS: Target date: 10/15/2021   I with basic HEP Baseline: Goal status: MET   2.  Determine whether to pursue AFO for L foot drop Baseline: will receive on 11/3 Goal status: MET   LONG TERM GOALS: Target date: 11/26/2021   I with final HEP Baseline:  Goal status: INITIAL   2.  Patient will perform all functional transfers MI Baseline: CGA Goal status: INITIAL   3.  Patient will perform 5 x STS in < 12 sec to demonstrate improved functional strength and control Baseline: 9 sec Goal status: MET  4.  Perform TUG in < 12 sec to demonstrate improved balance/decreased fall risk. Baseline: 19.57 Goal status: INITIAL   5.  Patient will ambulate x at least 500' functionally on indoor surfaces with LRAD, any necessary bracing for L ankle, MI Baseline: CGA, RW, foot drop, unsteady. Goal status: INITIAL   6. Increase FOTO to at least 68            Baseline 78            Goal Status MET     ASSESSMENT:   CLINICAL IMPRESSION: Patient is doing well overall, making some good progress with mobility and strength. She is still ambulating with a RW and is sometimes using a cane at home but not a lot. Some unsteadiness with sit to stands on airex, losses balance backwards 2x, cues  needed to slow down movements. We practiced gait training with a quad cane, first with a step through pattern to get used to moving her LLE and the AD at the same time. Then we worked on step through pattern, she does really well with this and is able to make a complete lap in the gym without tripping or losing her balance. Ended session with LE strengthening, was educated about possible soreness in the upcoming days.      OBJECTIVE IMPAIRMENTS Abnormal gait, decreased activity tolerance, decreased balance, decreased mobility, difficulty walking, decreased strength, improper body mechanics, and postural dysfunction. Gait training with quad cane, patient is unstable and requires modA to help regain balance and prevent fall. Will benefit from more gait and strength training.    ACTIVITY LIMITATIONS carrying, lifting, bending, standing, squatting, stairs, transfers, bathing, toileting, and locomotion level   PARTICIPATION LIMITATIONS: meal prep, cleaning, driving, and shopping   PERSONAL FACTORS Age and Behavior pattern are also affecting patient's functional outcome.    REHAB POTENTIAL: Good   CLINICAL DECISION MAKING: Stable/uncomplicated   EVALUATION COMPLEXITY: Moderate   PLAN: PT FREQUENCY: 2x/week   PT DURATION: 10 weeks   PLANNED INTERVENTIONS: Therapeutic exercises, Therapeutic activity, Neuromuscular re-education, Balance training, Gait training, Patient/Family education, Self Care, Joint mobilization, Stair training, and Manual therapy   PLAN FOR NEXT SESSION: balance and gait progressions-pt would like to work toward ambulation with a SPC   1:12 PM,11/06/21 Sherol Dade PT, [place] at [place]

## 2021-11-06 ENCOUNTER — Ambulatory Visit: Payer: Medicare Other

## 2021-11-06 ENCOUNTER — Encounter: Payer: Self-pay | Admitting: Internal Medicine

## 2021-11-06 DIAGNOSIS — R278 Other lack of coordination: Secondary | ICD-10-CM | POA: Diagnosis not present

## 2021-11-06 DIAGNOSIS — R2681 Unsteadiness on feet: Secondary | ICD-10-CM

## 2021-11-06 DIAGNOSIS — M6281 Muscle weakness (generalized): Secondary | ICD-10-CM

## 2021-11-06 DIAGNOSIS — R293 Abnormal posture: Secondary | ICD-10-CM | POA: Diagnosis not present

## 2021-11-06 DIAGNOSIS — R2689 Other abnormalities of gait and mobility: Secondary | ICD-10-CM

## 2021-11-06 DIAGNOSIS — R262 Difficulty in walking, not elsewhere classified: Secondary | ICD-10-CM

## 2021-11-07 ENCOUNTER — Telehealth: Payer: Self-pay | Admitting: Internal Medicine

## 2021-11-07 NOTE — Telephone Encounter (Signed)
LVM for pt to schedule an appt with PCP / per Provider pt is needing an appt, appt can be VV if ok with pt.

## 2021-11-07 NOTE — Telephone Encounter (Signed)
Noted  

## 2021-11-08 ENCOUNTER — Ambulatory Visit: Payer: Medicare Other

## 2021-11-08 DIAGNOSIS — R262 Difficulty in walking, not elsewhere classified: Secondary | ICD-10-CM

## 2021-11-08 DIAGNOSIS — R2681 Unsteadiness on feet: Secondary | ICD-10-CM

## 2021-11-08 DIAGNOSIS — M6281 Muscle weakness (generalized): Secondary | ICD-10-CM | POA: Diagnosis not present

## 2021-11-08 DIAGNOSIS — R278 Other lack of coordination: Secondary | ICD-10-CM | POA: Diagnosis not present

## 2021-11-08 DIAGNOSIS — R2689 Other abnormalities of gait and mobility: Secondary | ICD-10-CM | POA: Diagnosis not present

## 2021-11-08 DIAGNOSIS — R293 Abnormal posture: Secondary | ICD-10-CM | POA: Diagnosis not present

## 2021-11-08 NOTE — Therapy (Signed)
OUTPATIENT PHYSICAL THERAPY TREATMENT NOTE   Patient Name: Monique Ballard MRN: [id number] DOB:Jun 27, 1940, 81 y.o., female Today's Date: 11/08/2021    END OF SESSION:   PT End of Session - 11/08/21 1233     Visit Number 12    Date for PT Re-Evaluation 11/26/21    Progress Note Due on Visit 20    PT Start Time 1230    PT Stop Time 1315    PT Time Calculation (min) 45 min    Equipment Utilized During Treatment Gait belt    Activity Tolerance Patient tolerated treatment well;No increased pain    Behavior During Therapy WFL for tasks assessed/performed               Past Medical History:  Diagnosis Date   Anxiety    Breast cancer ([place]) fall 1997    Left Lumpectomy, XRT.  treated with Tamoxifem and Evista   Cryptogenic stroke ([place]) 08/20/2021   Depression    Endometriosis in her 20's   surgery to help clear endometriosis to obtain a pregnancy.   GERD (gastroesophageal reflux disease)    w/"stretching" remotely   HTN (hypertension)    Loop recorder Biotronik Biomonitor III 10/03/2021 10/03/2021   Migraines    Past Surgical History:  Procedure Laterality Date   BREAST LUMPECTOMY Left fall 1997   Lymph nodes X 3 were negative, ER+, Radiation treatmetn.  Took Tamoxifem X 5 yrs then Evista for 3-5 yrs.   CESAREAN SECTION     x2  first pregnancy breach and second normal   PATENT FORAMEN OVALE(PFO) CLOSURE N/A 07/12/2021   Procedure: PATENT FORAMEN OVALE(PFO) CLOSURE;  Surgeon: Adrian Prows, MD;  Location: [facility];  Service: Cardiovascular;  Laterality: N/A;   RIGHT HEART CATH N/A 07/12/2021   Procedure: RIGHT HEART CATH;  Surgeon: Adrian Prows, MD;  Location: [facility];  Service: Cardiovascular;  Laterality: N/A;   surgery for endometriosis     Patient Active Problem List   Diagnosis Date Noted   PFO s/p closure 11/04/2021   Loop recorder Biotronik Biomonitor III 10/03/2021 10/03/2021   Cryptogenic stroke ([place]) 08/20/2021   Closed fracture of lateral  malleolus of right fibula 08/07/2021   Debility 07/15/2021   Platypnea-orthodeoxia syndrome    Polycythemia vera, acquired ([place])    Nocturnal hypoxia 07/26/2020   Alcohol dependency ([place]) 05/15/2018   Tremor 02/24/2015   Gait disorder 02/24/2015   PCP NOTES >>>>>>> 12/13/2014   Osteoporosis 10/04/2011   Annual physical exam 09/07/2010   GERD 08/28/2009   Anxiety and depression 10/06/2006   Essential hypertension 10/06/2006   BREAST CANCER, HX OF 10/06/2006    ONSET DATE: 08/21/2021    REFERRING DIAG: Diagnosis I63.9 (ICD-10-CM) - Acute ischemic stroke (HCC)    THERAPY DIAG:  Unsteadiness on feet   Muscle weakness (generalized)   Other lack of coordination   Difficulty in walking, not elsewhere classified   Abnormal posture   Rationale for Evaluation and Treatment Rehabilitation   SUBJECTIVE:                                                                            SUBJECTIVE STATEMENT: I am doing fine, have not had any  falls. I got fitted for the AFO and will pick up on 11/3.    PERTINENT HISTORY: HISTORY OF PRESENT ILLNESS Monique Ballard is a 81 y.o. female with history of HTN, dementia, BRCA, migraines, alcohol dependence, anxiety and recent PFO closure to treat persistent hypoxia presenting with  acute onset expressive aphasia.  She was brought to the hospital and given TNK to treat her stroke.  Her symptoms have improved.  Patient reportedly had a similar episode of expressive aphasia about a week ago that resolved spontaneously.  She had a procedure to close a PFO in June of this year.  MRI reveals no acute abnormality, so stroke is likely to small to be seen. She has been started on 2 months of Eliquis with ASA 60m and is to follow up with cardiology outpatient as well.    PAIN:  Are you having pain? No   PRECAUTIONS: None   WEIGHT BEARING RESTRICTIONS No   FALLS: Has patient fallen in last 6 months? Yes. Number of falls 2   LIVING ENVIRONMENT: Lives  with: lives with their spouse, lives with their son, and 7 hours of personal care attendant M-F Lives in: House/apartment Stairs: Yes: External: 2 steps; can reach both Has following equipment at home: Quad cane small base, Walker - 2 wheeled, WEnvironmental consultant- 4 wheeled, Shower bench, and bed side commode   PLOF: Needs assistance with ADLs and Needs assistance with gait   PATIENT GOALS Be able to walk without the walker.   OBJECTIVE:    DIAGNOSTIC FINDINGS: DISCHARGE DIAGNOSIS: left hemispheric stroke too small to be seen on MRI versus TIA possibly embolic from recent PFO closure -device thrombosis versus transient paroxysmal A-fib s/p Iv TNk with full recovery Principal Problem:   Acute ischemic stroke (HCC) Platypnoea orthodeoxia syndrome s/p PFO closure   COGNITION: Overall cognitive status: Within functional limits for tasks assessed             SENSATION: WFL   COORDINATION: WFL U and LE   EDEMA:  None noted   MUSCLE TONE: RLE: Within functional limits     MUSCLE LENGTH: Hamstrings: Right 72 deg; Left 81 deg   POSTURE: rounded shoulders and forward head   LOWER EXTREMITY ROM:    WFL BLE, including B ankles     LOWER EXTREMITY MMT:     MMT Right Eval Left Eval  Hip flexion 4 4  Hip extension 4 4  Hip abduction 4 4  Hip adduction      Hip internal rotation      Hip external rotation      Knee flexion 4 4  Knee extension 4 4  Ankle dorsiflexion 4 2  Ankle plantarflexion   2+  Ankle inversion      Ankle eversion      (Blank rows = not tested)   BED MOBILITY:  I   TRANSFERS: Assistive device utilized: WEnvironmental consultant- 2 wheeled  Sit to stand: SBA Stand to sit: SBA Chair to chair: SBA   CURB:  Level of Assistance: Min A Assistive device utilized: WEnvironmental consultant- 2 wheeled   STAIRS:           Level of Assistance: CGA           Stair Negotiation Technique: Step to Pattern with Bilateral Rails           Number of Stairs: 2              GAIT: Gait pattern: step  through pattern, decreased ankle dorsiflexion- Left, Left steppage, Left foot flat, and poor foot clearance- Left Distance walked: 80 with walker, 6f with SPC Assistive device utilized: Walker - 2 wheeled Level of assistance: CGA Comments: Pt had 1 near LOB with SPC but was able to recover independently. PT provided CGA for safety. Pt has improved coordination with cane placement    FUNCTIONAL TESTs:  5 times sit to stand: 9 sec (10/31/21) Timed up and go (TUG): 19.57 Functional gait assessment: TBD   PATIENT SURVEYS:  FOTO 78 (10/31/21)   TODAY'S TREATMENT:  11/08/21 NuStep L5 x638ms  TUG-13.57s Step ups on airex with cane  Resisted gait forwards 10#  Walking with cane 2 laps in gym  STS holding yellow ball 2x10  2 way hip 2# 2x10  Calf raises 2# 2x10  11/06/21 NuStep L5 x6m55m  Step ups 6" x10 each leg S2S on airex 2x10  Walking with quad cane Leg ext 10# 2x10 HS curls 20# 2x10  10/31/21 Tandem walking on airex pad in // bars x4 reps CGA Tandem stance, firm surface, 2x30 sec CGA Feet together airex 2x30 sec EO, x30 sec EC CGA Weight shifting on trampoline intermittent UE assist, CGA x30 sec each Sit to stand holding blue weighted ball 3x5 reps  Sidestepping with yellow TB around feet in // bars down/back x4 Nustep L5 x5 min Ambulation SPC [id number], PT cuing for proper SPC placement initially   10/18/21 NuStep L5 x6min20malking w/o AD 3 laps, 1 instance of foot catching and minA to prevent tripping and falling  Side steps w/o AD 2x30ft 7f minA  Fitter 2x10 each leg  Standing on airex cone taps with UUE support Feet together on airex 30s  Half tandem on airex 30s Step ups on airex    10/16/21 NuStep L5 x 6 minutes Standing lateral weight shifts on airex beam with BUE support on parallel bars, then L foot only B side step x4 on Aires beam SLS in parallel bars, approx 5 sec on R, 2 sec on L SLS mini squats to heel raise in parallel bars, 4 on L, 5 on  R. Standing on Airex pad, alternately tapping each foot on 4" step, with UUE support. Occasional mild LOB. Standing hip abd, ext, flexion, 2 x 10 reps each, with 2# weights at ankles, BUE support Ambulation while holding 3# WATE bar in BUE, 2 x 75', CGA, excessive hip flexion to clear L toes, but stable.     10/11/21 Nustep L4 x6mins 42mking with cane 1 big loop  Walking w/o AD Step up on airex 2HHA  Standing on airex holding 2# WaTE 30s Step up 6" from airex 2HHA at stairs  Taps on 6" standing on airex only 1 HHA Placing cones and picking them up on 6" step then did standing on airex minA  LAQ 3# 2x10 Calf raises 2x10 w/2HHA            PATIENT EDUCATION: Education details: exercise technique Person educated: Patient  Education method: Explanation Education comprehension: verbalized understanding     HOME EXERCISE PROGRAM:  [id number]   GOALS: Goals reviewed with patient? Yes   SHORT TERM GOALS: Target date: 10/15/2021   I with basic HEP Baseline: Goal status: MET   2.  Determine whether to pursue AFO for L foot drop Baseline: will receive on 11/3 Goal status: MET   LONG TERM GOALS: Target date: 11/26/2021   I with final HEP Baseline:  Goal status: INITIAL  2.  Patient will perform all functional transfers MI Baseline: CGA Goal status: INITIAL   3.  Patient will perform 5 x STS in < 12 sec to demonstrate improved functional strength and control Baseline: 9 sec Goal status: MET   4.  Perform TUG in < 12 sec to demonstrate improved balance/decreased fall risk. Baseline: 19.57, 13.57w- 10/19 Goal status: progressing   5.  Patient will ambulate x at least 500' functionally on indoor surfaces with LRAD, any necessary bracing for L ankle, MI Baseline: CGA, RW, foot drop, unsteady. Goal status: INITIAL   6. Increase FOTO to at least 68            Baseline 78            Goal Status MET     ASSESSMENT:   CLINICAL IMPRESSION: Patient is continuing to make  progress with strength and balance. Improves her TUG score today. Tried step ups on airex, but unable to do without support so tried with cane--able to complete with SBA. Hesitant with resisted gait especially with backwards steps but does well with repetition. Ambulating with more confidence and is stable with quad cane, often times not fully making contact with it on the ground. Cues needed with S2S to flex at trunk-- loses her balance backwards on to table 2 times. Continue to progress as tolerated.      OBJECTIVE IMPAIRMENTS Abnormal gait, decreased activity tolerance, decreased balance, decreased mobility, difficulty walking, decreased strength, improper body mechanics, and postural dysfunction. Gait training with quad cane, patient is unstable and requires modA to help regain balance and prevent fall. Will benefit from more gait and strength training.    ACTIVITY LIMITATIONS carrying, lifting, bending, standing, squatting, stairs, transfers, bathing, toileting, and locomotion level   PARTICIPATION LIMITATIONS: meal prep, cleaning, driving, and shopping   PERSONAL FACTORS Age and Behavior pattern are also affecting patient's functional outcome.    REHAB POTENTIAL: Good   CLINICAL DECISION MAKING: Stable/uncomplicated   EVALUATION COMPLEXITY: Moderate   PLAN: PT FREQUENCY: 2x/week   PT DURATION: 10 weeks   PLANNED INTERVENTIONS: Therapeutic exercises, Therapeutic activity, Neuromuscular re-education, Balance training, Gait training, Patient/Family education, Self Care, Joint mobilization, Stair training, and Manual therapy   PLAN FOR NEXT SESSION: balance and gait SPC gait, side steps on airex with cane, calf and toe raises    1:15 PM,11/08/21 Sherol Dade PT, DPT [place] at [place]

## 2021-11-09 ENCOUNTER — Encounter: Payer: Self-pay | Admitting: Internal Medicine

## 2021-11-09 ENCOUNTER — Telehealth (INDEPENDENT_AMBULATORY_CARE_PROVIDER_SITE_OTHER): Payer: Medicare Other | Admitting: Internal Medicine

## 2021-11-09 VITALS — Ht 63.0 in | Wt 124.0 lb

## 2021-11-09 DIAGNOSIS — M21372 Foot drop, left foot: Secondary | ICD-10-CM | POA: Diagnosis not present

## 2021-11-09 NOTE — Progress Notes (Unsigned)
Subjective:    Patient ID: Monique Ballard, female    DOB: [birth year], 81 y.o.   MRN: [id number]  DOS:  11/09/2021 Type of visit - description: Virtual Visit via Telephone    I connected with above mentioned patient  by telephone and verified that I am speaking with the correct person using two identifiers.  THIS ENCOUNTER IS A VIRTUAL VISIT DUE TO COVID-19 - PATIENT WAS NOT SEEN IN THE OFFICE. PATIENT HAS CONSENTED TO VIRTUAL VISIT / TELEMEDICINE VISIT   Location of patient: home  Location of provider: office  Persons participating in the virtual visit: patient, provider   I discussed the limitations, risks, security and privacy concerns of performing an evaluation and management service by telephone and the availability of in person appointments. I also discussed with the patient that there may be a patient responsible charge related to this service. The patient expressed understanding and agreed to proceed.  Acute I communicated via telephone with the patient. We talk about her L foot drop.       I discussed the assessment and treatment plan with the patient. The patient was provided an opportunity to ask questions and all were answered. The patient agreed with the plan and demonstrated an understanding of the instructions.   The patient was advised to call back or seek an in-person evaluation if the symptoms worsen or if the condition fails to improve as anticipated.  I provided *** minutes of non-face-to-face time during this encounter.  Kathlene November, MD      Review of Systems See above   Past Medical History:  Diagnosis Date   Anxiety    Breast cancer ([place]) fall 1997    Left Lumpectomy, XRT.  treated with Tamoxifem and Evista   Cryptogenic stroke ([place]) 08/20/2021   Depression    Endometriosis in her 20's   surgery to help clear endometriosis to obtain a pregnancy.   GERD (gastroesophageal reflux disease)    w/"stretching" remotely   HTN (hypertension)    Loop  recorder Biotronik Biomonitor III 10/03/2021 10/03/2021   Migraines     Past Surgical History:  Procedure Laterality Date   BREAST LUMPECTOMY Left fall 1997   Lymph nodes X 3 were negative, ER+, Radiation treatmetn.  Took Tamoxifem X 5 yrs then Evista for 3-5 yrs.   CESAREAN SECTION     x2  first pregnancy breach and second normal   PATENT FORAMEN OVALE(PFO) CLOSURE N/A 07/12/2021   Procedure: PATENT FORAMEN OVALE(PFO) CLOSURE;  Surgeon: Adrian Prows, MD;  Location: [facility];  Service: Cardiovascular;  Laterality: N/A;   RIGHT HEART CATH N/A 07/12/2021   Procedure: RIGHT HEART CATH;  Surgeon: Adrian Prows, MD;  Location: [facility];  Service: Cardiovascular;  Laterality: N/A;   surgery for endometriosis      Current Outpatient Medications  Medication Instructions   apixaban (ELIQUIS) 5 mg, Oral, 2 times daily   aspirin EC 81 mg, Oral, Daily, Swallow whole.   escitalopram (LEXAPRO) 20 mg, Oral, Daily   folic acid (FOLVITE) 1 mg, Oral, Daily   metoprolol tartrate (LOPRESSOR) 12.5 mg, Oral, 2 times daily   Multiple Vitamin (MULTIVITAMIN WITH MINERALS) TABS tablet 1 tablet, Oral, Daily   pantoprazole (PROTONIX) 40 mg, Oral, Daily   rosuvastatin (CRESTOR) 20 mg, Oral, Daily   thiamine (VITAMIN B1) 100 mg, Oral, Daily       Objective:   Physical Exam Ht '5\' 3"'$  (1.6 m)   Wt 124 lb (56.2 kg)  LMP 01/22/1992 (Within Months)   BMI 21.97 kg/m  Over the phone, she sounded well, in no physical or mental distress.  No formal mental exam was done.    Assessment     Assessment HTN Depression, Anxiety - Dr Clovis Pu  Tremor dx [id number] Gait d/o: unsteady onset ~ 2017 GERD with esophageal stricture L Breast cancer, 1997, lumpectomy, XRT, released from oncology ETOH Stroke per MRI brain (2021), rx ASA per neuro Fatty liver per [place]  03/07/2020 Hypoxemia: Hypoxemia: Found on admission 09/30/2019.  Resolved after PFO closure   Cryptogenic stroke 07/2021 "too small to be seen on MRI  versus TIA, possibly embolic due to previous/recent PFO closure or paroxysmal A-fib"   PLAN: Left foot drop: The patient has a history of L foot drop. She remains ambulatory but needs a cane or a walker. Due to the foot drop, her risk of falls is significantly increased and impairs her balance. She did try physical therapy with some help. There are no issues that could difficult her use of orthosis.

## 2021-11-10 NOTE — Assessment & Plan Note (Signed)
Left foot drop: The patient has a history of L foot drop. She remains ambulatory but needs a cane or a walker. Due to the foot drop, her risk of falls is significantly increased and impairs her balance. She did try physical therapy with some help. There are no issues that could difficult her use of orthosis.

## 2021-11-12 NOTE — Therapy (Incomplete)
OUTPATIENT PHYSICAL THERAPY TREATMENT NOTE   Patient Name: Monique Ballard MRN: [id number] DOB:Jan 19, 1941, 81 y.o., female Today's Date: 11/12/2021    END OF SESSION:       Past Medical History:  Diagnosis Date   Anxiety    Breast cancer ([place]) fall 1997    Left Lumpectomy, XRT.  treated with Tamoxifem and Evista   Cryptogenic stroke ([place]) 08/20/2021   Depression    Endometriosis in her 20's   surgery to help clear endometriosis to obtain a pregnancy.   GERD (gastroesophageal reflux disease)    w/"stretching" remotely   HTN (hypertension)    Loop recorder Biotronik Biomonitor III 10/03/2021 10/03/2021   Migraines    Past Surgical History:  Procedure Laterality Date   BREAST LUMPECTOMY Left fall 1997   Lymph nodes X 3 were negative, ER+, Radiation treatmetn.  Took Tamoxifem X 5 yrs then Evista for 3-5 yrs.   CESAREAN SECTION     x2  first pregnancy breach and second normal   PATENT FORAMEN OVALE(PFO) CLOSURE N/A 07/12/2021   Procedure: PATENT FORAMEN OVALE(PFO) CLOSURE;  Surgeon: Adrian Prows, MD;  Location: [facility];  Service: Cardiovascular;  Laterality: N/A;   RIGHT HEART CATH N/A 07/12/2021   Procedure: RIGHT HEART CATH;  Surgeon: Adrian Prows, MD;  Location: [facility];  Service: Cardiovascular;  Laterality: N/A;   surgery for endometriosis     Patient Active Problem List   Diagnosis Date Noted   PFO s/p closure 11/04/2021   Loop recorder Biotronik Biomonitor III 10/03/2021 10/03/2021   Cryptogenic stroke ([place]) 08/20/2021   Closed fracture of lateral malleolus of right fibula 08/07/2021   Debility 07/15/2021   Platypnea-orthodeoxia syndrome    Polycythemia vera, acquired ([place])    Nocturnal hypoxia 07/26/2020   Alcohol dependency ([place]) 05/15/2018   Tremor 02/24/2015   Gait disorder 02/24/2015   PCP NOTES >>>>>>> 12/13/2014   Osteoporosis 10/04/2011   Annual physical exam 09/07/2010   GERD 08/28/2009   Anxiety and depression 10/06/2006    Essential hypertension 10/06/2006   BREAST CANCER, HX OF 10/06/2006    ONSET DATE: 08/21/2021    REFERRING DIAG: Diagnosis I63.9 (ICD-10-CM) - Acute ischemic stroke (HCC)    THERAPY DIAG:  Unsteadiness on feet   Muscle weakness (generalized)   Other lack of coordination   Difficulty in walking, not elsewhere classified   Abnormal posture   Rationale for Evaluation and Treatment Rehabilitation   SUBJECTIVE:                                                                            SUBJECTIVE STATEMENT: I am doing fine, have not had any falls. I got fitted for the AFO and will pick up on 11/3.    PERTINENT HISTORY: HISTORY OF PRESENT ILLNESS Ms. Monique Ballard is a 81 y.o. female with history of HTN, dementia, BRCA, migraines, alcohol dependence, anxiety and recent PFO closure to treat persistent hypoxia presenting with  acute onset expressive aphasia.  She was brought to the hospital and given TNK to treat her stroke.  Her symptoms have improved.  Patient reportedly had a similar episode of expressive aphasia about a week ago that resolved spontaneously.  She  had a procedure to close a PFO in June of this year.  MRI reveals no acute abnormality, so stroke is likely to small to be seen. She has been started on 2 months of Eliquis with ASA 25m and is to follow up with cardiology outpatient as well.    PAIN:  Are you having pain? No   PRECAUTIONS: None   WEIGHT BEARING RESTRICTIONS No   FALLS: Has patient fallen in last 6 months? Yes. Number of falls 2   LIVING ENVIRONMENT: Lives with: lives with their spouse, lives with their son, and 7 hours of personal care attendant M-F Lives in: House/apartment Stairs: Yes: External: 2 steps; can reach both Has following equipment at home: Quad cane small base, Walker - 2 wheeled, WEnvironmental consultant- 4 wheeled, Shower bench, and bed side commode   PLOF: Needs assistance with ADLs and Needs assistance with gait   PATIENT GOALS Be able to walk  without the walker.   OBJECTIVE:    DIAGNOSTIC FINDINGS: DISCHARGE DIAGNOSIS: left hemispheric stroke too small to be seen on MRI versus TIA possibly embolic from recent PFO closure -device thrombosis versus transient paroxysmal A-fib s/p Iv TNk with full recovery Principal Problem:   Acute ischemic stroke ([place] orthodeoxia syndrome s/p PFO closure   COGNITION: Overall cognitive status: Within functional limits for tasks assessed             SENSATION: WFL   COORDINATION: WFL U and LE   EDEMA:  None noted   MUSCLE TONE: RLE: Within functional limits     MUSCLE LENGTH: Hamstrings: Right 72 deg; Left 81 deg   POSTURE: rounded shoulders and forward head   LOWER EXTREMITY ROM:    WFL BLE, including B ankles     LOWER EXTREMITY MMT:     MMT Right Eval Left Eval  Hip flexion 4 4  Hip extension 4 4  Hip abduction 4 4  Hip adduction      Hip internal rotation      Hip external rotation      Knee flexion 4 4  Knee extension 4 4  Ankle dorsiflexion 4 2  Ankle plantarflexion   2+  Ankle inversion      Ankle eversion      (Blank rows = not tested)   BED MOBILITY:  I   TRANSFERS: Assistive device utilized: WEnvironmental consultant- 2 wheeled  Sit to stand: SBA Stand to sit: SBA Chair to chair: SBA   CURB:  Level of Assistance: Min A Assistive device utilized: WEnvironmental consultant- 2 wheeled   STAIRS:           Level of Assistance: CGA           Stair Negotiation Technique: Step to Pattern with Bilateral Rails           Number of Stairs: 2              GAIT: Gait pattern: step through pattern, decreased ankle dorsiflexion- Left, Left steppage, Left foot flat, and poor foot clearance- Left Distance walked: 80 with walker, 86fwith SPC Assistive device utilized: Walker - 2 wheeled Level of assistance: CGA Comments: Pt had 1 near LOB with SPC but was able to recover independently. PT provided CGA for safety. Pt has improved coordination with cane placement    FUNCTIONAL  TESTs:  5 times sit to stand: 9 sec (10/31/21) Timed up and go (TUG): 19.57 Functional gait assessment: TBD   PATIENT SURVEYS:  FOTO 78 (10/31/21)  TODAY'S TREATMENT:  11/12/21 NuStep SPC gait side steps on airex with cane calf and toe raises  Lateral steps w/band in // bar  Walking on beam  Standing on airex feet together Tandem on airex   11/08/21 NuStep L5 x32mns  TUG-13.57s Step ups on airex with cane  Resisted gait forwards 10#  Walking with cane 2 laps in gym  STS holding yellow ball 2x10  2 way hip 2# 2x10  Calf raises 2# 2x10  11/06/21 NuStep L5 x614ms  Step ups 6" x10 each leg S2S on airex 2x10  Walking with quad cane Leg ext 10# 2x10 HS curls 20# 2x10  10/31/21 Tandem walking on airex pad in // bars x4 reps CGA Tandem stance, firm surface, 2x30 sec CGA Feet together airex 2x30 sec EO, x30 sec EC CGA Weight shifting on trampoline intermittent UE assist, CGA x30 sec each Sit to stand holding blue weighted ball 3x5 reps  Sidestepping with yellow TB around feet in // bars down/back x4 Nustep L5 x5 min Ambulation SPC [id number], PT cuing for proper SPC placement initially   10/18/21 NuStep L5 x6mi56mWalking w/o AD 3 laps, 1 instance of foot catching and minA to prevent tripping and falling  Side steps w/o AD 2x30ft84fh minA  Fitter 2x10 each leg  Standing on airex cone taps with UUE support Feet together on airex 30s  Half tandem on airex 30s Step ups on airex    10/16/21 NuStep L5 x 6 minutes Standing lateral weight shifts on airex beam with BUE support on parallel bars, then L foot only B side step x4 on [place] beam SLS in parallel bars, approx 5 sec on R, 2 sec on L SLS mini squats to heel raise in parallel bars, 4 on L, 5 on R. Standing on Airex pad, alternately tapping each foot on 4" step, with UUE support. Occasional mild LOB. Standing hip abd, ext, flexion, 2 x 10 reps each, with 2# weights at ankles, BUE support Ambulation while holding  3# WATE bar in BUE, 2 x 75', CGA, excessive hip flexion to clear L toes, but stable.     10/11/21 Nustep L4 x6mins47mlking with cane 1 big loop  Walking w/o AD Step up on airex 2HHA  Standing on airex holding 2# WaTE 30s Step up 6" from airex 2HHA at stairs  Taps on 6" standing on airex only 1 HHA Placing cones and picking them up on 6" step then did standing on airex minA  LAQ 3# 2x10 Calf raises 2x10 w/2HHA            PATIENT EDUCATION: Education details: exercise technique Person educated: Patient  Education method: Explanation Education comprehension: verbalized understanding     HOME EXERCISE PROGRAM:  [id number]   GOALS: Goals reviewed with patient? Yes   SHORT TERM GOALS: Target date: 10/15/2021   I with basic HEP Baseline: Goal status: MET   2.  Determine whether to pursue AFO for L foot drop Baseline: will receive on 11/3 Goal status: MET   LONG TERM GOALS: Target date: 11/26/2021   I with final HEP Baseline:  Goal status: INITIAL   2.  Patient will perform all functional transfers MI Baseline: CGA Goal status: INITIAL   3.  Patient will perform 5 x STS in < 12 sec to demonstrate improved functional strength and control Baseline: 9 sec Goal status: MET   4.  Perform TUG in < 12 sec to demonstrate improved balance/decreased fall risk. Baseline: 19.57,  13.57s- 10/19 Goal status: progressing   5.  Patient will ambulate x at least 500' functionally on indoor surfaces with LRAD, any necessary bracing for L ankle, MI Baseline: CGA, RW, foot drop, unsteady. Goal status: INITIAL   6. Increase FOTO to at least 68            Baseline 78            Goal Status MET     ASSESSMENT:   CLINICAL IMPRESSION: Patient is continuing to make progress with strength and balance. Improves her TUG score today. Tried step ups on airex, but unable to do without support so tried with cane--able to complete with SBA. Hesitant with resisted gait especially with  backwards steps but does well with repetition. Ambulating with more confidence and is stable with quad cane, often times not fully making contact with it on the ground. Cues needed with S2S to flex at trunk-- loses her balance backwards on to table 2 times. Continue to progress as tolerated.      OBJECTIVE IMPAIRMENTS Abnormal gait, decreased activity tolerance, decreased balance, decreased mobility, difficulty walking, decreased strength, improper body mechanics, and postural dysfunction. Gait training with quad cane, patient is unstable and requires modA to help regain balance and prevent fall. Will benefit from more gait and strength training.    ACTIVITY LIMITATIONS carrying, lifting, bending, standing, squatting, stairs, transfers, bathing, toileting, and locomotion level   PARTICIPATION LIMITATIONS: meal prep, cleaning, driving, and shopping   PERSONAL FACTORS Age and Behavior pattern are also affecting patient's functional outcome.    REHAB POTENTIAL: Good   CLINICAL DECISION MAKING: Stable/uncomplicated   EVALUATION COMPLEXITY: Moderate   PLAN: PT FREQUENCY: 2x/week   PT DURATION: 10 weeks   PLANNED INTERVENTIONS: Therapeutic exercises, Therapeutic activity, Neuromuscular re-education, Balance training, Gait training, Patient/Family education, Self Care, Joint mobilization, Stair training, and Manual therapy   PLAN FOR NEXT SESSION: balance and gait SPC gait, side steps on airex with cane, calf and toe raises    10:04 AM,11/12/21 Sherol Dade PT, DPT [place] [facility] [place]

## 2021-11-13 ENCOUNTER — Ambulatory Visit: Payer: Medicare Other

## 2021-11-15 ENCOUNTER — Other Ambulatory Visit: Payer: Self-pay

## 2021-11-15 ENCOUNTER — Ambulatory Visit: Payer: Medicare Other

## 2021-11-15 MED ORDER — COVID-19 MRNA 2023-2024 VACCINE (COMIRNATY) 0.3 ML INJECTION
0.3000 mL | Freq: Once | INTRAMUSCULAR | 0 refills | Status: DC
  Filled 2021-11-15: qty 0.3, 1d supply, fill #0

## 2021-11-15 NOTE — Therapy (Incomplete)
OUTPATIENT PHYSICAL THERAPY TREATMENT NOTE   Patient Name: Monique Ballard MRN: [id number] DOB:11/05/1940, 81 y.o., female Today's Date: 11/15/2021    END OF SESSION:       Past Medical History:  Diagnosis Date   Anxiety    Breast cancer ([place]) fall 1997    Left Lumpectomy, XRT.  treated with Tamoxifem and Evista   Cryptogenic stroke ([place]) 08/20/2021   Depression    Endometriosis in her 20's   surgery to help clear endometriosis to obtain a pregnancy.   GERD (gastroesophageal reflux disease)    w/"stretching" remotely   HTN (hypertension)    Loop recorder Biotronik Biomonitor III 10/03/2021 10/03/2021   Migraines    Past Surgical History:  Procedure Laterality Date   BREAST LUMPECTOMY Left fall 1997   Lymph nodes X 3 were negative, ER+, Radiation treatmetn.  Took Tamoxifem X 5 yrs then Evista for 3-5 yrs.   CESAREAN SECTION     x2  first pregnancy breach and second normal   PATENT FORAMEN OVALE(PFO) CLOSURE N/A 07/12/2021   Procedure: PATENT FORAMEN OVALE(PFO) CLOSURE;  Surgeon: Adrian Prows, MD;  Location: [facility];  Service: Cardiovascular;  Laterality: N/A;   RIGHT HEART CATH N/A 07/12/2021   Procedure: RIGHT HEART CATH;  Surgeon: Adrian Prows, MD;  Location: [facility];  Service: Cardiovascular;  Laterality: N/A;   surgery for endometriosis     Patient Active Problem List   Diagnosis Date Noted   PFO s/p closure 11/04/2021   Loop recorder Biotronik Biomonitor III 10/03/2021 10/03/2021   Cryptogenic stroke ([place]) 08/20/2021   Closed fracture of lateral malleolus of right fibula 08/07/2021   Debility 07/15/2021   Platypnea-orthodeoxia syndrome    Polycythemia vera, acquired ([place])    Nocturnal hypoxia 07/26/2020   Alcohol dependency ([place]) 05/15/2018   Tremor 02/24/2015   Gait disorder 02/24/2015   PCP NOTES >>>>>>> 12/13/2014   Osteoporosis 10/04/2011   Annual physical exam 09/07/2010   GERD 08/28/2009   Anxiety and depression 10/06/2006    Essential hypertension 10/06/2006   BREAST CANCER, HX OF 10/06/2006    ONSET DATE: 08/21/2021    REFERRING DIAG: Diagnosis I63.9 (ICD-10-CM) - Acute ischemic stroke (HCC)    THERAPY DIAG:  Unsteadiness on feet   Muscle weakness (generalized)   Other lack of coordination   Difficulty in walking, not elsewhere classified   Abnormal posture   Rationale for Evaluation and Treatment Rehabilitation   SUBJECTIVE:                                                                            SUBJECTIVE STATEMENT: I am doing fine, have not had any falls. I got fitted for the AFO and will pick up on 11/3.    PERTINENT HISTORY: HISTORY OF PRESENT ILLNESS Monique Ballard is a 81 y.o. female with history of HTN, dementia, BRCA, migraines, alcohol dependence, anxiety and recent PFO closure to treat persistent hypoxia presenting with  acute onset expressive aphasia.  She was brought to the hospital and given TNK to treat her stroke.  Her symptoms have improved.  Patient reportedly had a similar episode of expressive aphasia about a week ago that resolved spontaneously.  She  had a procedure to close a PFO in June of this year.  MRI reveals no acute abnormality, so stroke is likely to small to be seen. She has been started on 2 months of Eliquis with ASA 25m and is to follow up with cardiology outpatient as well.    PAIN:  Are you having pain? No   PRECAUTIONS: None   WEIGHT BEARING RESTRICTIONS No   FALLS: Has patient fallen in last 6 months? Yes. Number of falls 2   LIVING ENVIRONMENT: Lives with: lives with their spouse, lives with their son, and 7 hours of personal care attendant M-F Lives in: House/apartment Stairs: Yes: External: 2 steps; can reach both Has following equipment at home: Quad cane small base, Walker - 2 wheeled, WEnvironmental consultant- 4 wheeled, Shower bench, and bed side commode   PLOF: Needs assistance with ADLs and Needs assistance with gait   PATIENT GOALS Be able to walk  without the walker.   OBJECTIVE:    DIAGNOSTIC FINDINGS: DISCHARGE DIAGNOSIS: left hemispheric stroke too small to be seen on MRI versus TIA possibly embolic from recent PFO closure -device thrombosis versus transient paroxysmal A-fib s/p Iv TNk with full recovery Principal Problem:   Acute ischemic stroke [place] Platypnoea orthodeoxia syndrome s/p PFO closure   COGNITION: Overall cognitive status: Within functional limits for tasks assessed             SENSATION: WFL   COORDINATION: WFL U and LE   EDEMA:  None noted   MUSCLE TONE: RLE: Within functional limits     MUSCLE LENGTH: Hamstrings: Right 72 deg; Left 81 deg   POSTURE: rounded shoulders and forward head   LOWER EXTREMITY ROM:    WFL BLE, including B ankles     LOWER EXTREMITY MMT:     MMT Right Eval Left Eval  Hip flexion 4 4  Hip extension 4 4  Hip abduction 4 4  Hip adduction      Hip internal rotation      Hip external rotation      Knee flexion 4 4  Knee extension 4 4  Ankle dorsiflexion 4 2  Ankle plantarflexion   2+  Ankle inversion      Ankle eversion      (Blank rows = not tested)   BED MOBILITY:  I   TRANSFERS: Assistive device utilized: WEnvironmental consultant- 2 wheeled  Sit to stand: SBA Stand to sit: SBA Chair to chair: SBA   CURB:  Level of Assistance: Min A Assistive device utilized: WEnvironmental consultant- 2 wheeled   STAIRS:           Level of Assistance: CGA           Stair Negotiation Technique: Step to Pattern with Bilateral Rails           Number of Stairs: 2              GAIT: Gait pattern: step through pattern, decreased ankle dorsiflexion- Left, Left steppage, Left foot flat, and poor foot clearance- Left Distance walked: 80 with walker, 86fwith SPC Assistive device utilized: Walker - 2 wheeled Level of assistance: CGA Comments: Pt had 1 near LOB with SPC but was able to recover independently. PT provided CGA for safety. Pt has improved coordination with cane placement    FUNCTIONAL  TESTs:  5 times sit to stand: 9 sec (10/31/21) Timed up and go (TUG): 19.57 Functional gait assessment: TBD   PATIENT SURVEYS:  FOTO 78 (10/31/21)  TODAY'S TREATMENT:  11/12/21 NuStep SPC gait side steps on airex with cane calf and toe raises  Lateral steps w/band in // bar  Walking on beam  Standing on airex feet together Tandem on airex   11/08/21 NuStep L5 x32mns  TUG-13.57s Step ups on airex with cane  Resisted gait forwards 10#  Walking with cane 2 laps in gym  STS holding yellow ball 2x10  2 way hip 2# 2x10  Calf raises 2# 2x10  11/06/21 NuStep L5 x614ms  Step ups 6" x10 each leg S2S on airex 2x10  Walking with quad cane Leg ext 10# 2x10 HS curls 20# 2x10  10/31/21 Tandem walking on airex pad in // bars x4 reps CGA Tandem stance, firm surface, 2x30 sec CGA Feet together airex 2x30 sec EO, x30 sec EC CGA Weight shifting on trampoline intermittent UE assist, CGA x30 sec each Sit to stand holding blue weighted ball 3x5 reps  Sidestepping with yellow TB around feet in // bars down/back x4 Nustep L5 x5 min Ambulation SPC [id number], PT cuing for proper SPC placement initially   10/18/21 NuStep L5 x6mi56mWalking w/o AD 3 laps, 1 instance of foot catching and minA to prevent tripping and falling  Side steps w/o AD 2x30ft84fh minA  Fitter 2x10 each leg  Standing on airex cone taps with UUE support Feet together on airex 30s  Half tandem on airex 30s Step ups on airex    10/16/21 NuStep L5 x 6 minutes Standing lateral weight shifts on airex beam with BUE support on parallel bars, then L foot only B side step x4 on Aires beam SLS in parallel bars, approx 5 sec on R, 2 sec on L SLS mini squats to heel raise in parallel bars, 4 on L, 5 on R. Standing on Airex pad, alternately tapping each foot on 4" step, with UUE support. Occasional mild LOB. Standing hip abd, ext, flexion, 2 x 10 reps each, with 2# weights at ankles, BUE support Ambulation while holding  3# WATE bar in BUE, 2 x 75', CGA, excessive hip flexion to clear L toes, but stable.     10/11/21 Nustep L4 x6mins47mlking with cane 1 big loop  Walking w/o AD Step up on airex 2HHA  Standing on airex holding 2# WaTE 30s Step up 6" from airex 2HHA at stairs  Taps on 6" standing on airex only 1 HHA Placing cones and picking them up on 6" step then did standing on airex minA  LAQ 3# 2x10 Calf raises 2x10 w/2HHA            PATIENT EDUCATION: Education details: exercise technique Person educated: Patient  Education method: Explanation Education comprehension: verbalized understanding     HOME EXERCISE PROGRAM:  [id number]   GOALS: Goals reviewed with patient? Yes   SHORT TERM GOALS: Target date: 10/15/2021   I with basic HEP Baseline: Goal status: MET   2.  Determine whether to pursue AFO for L foot drop Baseline: will receive on 11/3 Goal status: MET   LONG TERM GOALS: Target date: 11/26/2021   I with final HEP Baseline:  Goal status: INITIAL   2.  Patient will perform all functional transfers MI Baseline: CGA Goal status: INITIAL   3.  Patient will perform 5 x STS in < 12 sec to demonstrate improved functional strength and control Baseline: 9 sec Goal status: MET   4.  Perform TUG in < 12 sec to demonstrate improved balance/decreased fall risk. Baseline: 19.57,  13.57s- 10/19 Goal status: progressing   5.  Patient will ambulate x at least 500' functionally on indoor surfaces with LRAD, any necessary bracing for L ankle, MI Baseline: CGA, RW, foot drop, unsteady. Goal status: INITIAL   6. Increase FOTO to at least 68            Baseline 78            Goal Status MET     ASSESSMENT:   CLINICAL IMPRESSION: Patient is continuing to make progress with strength and balance. Improves her TUG score today. Tried step ups on airex, but unable to do without support so tried with cane--able to complete with SBA. Hesitant with resisted gait especially with  backwards steps but does well with repetition. Ambulating with more confidence and is stable with quad cane, often times not fully making contact with it on the ground. Cues needed with S2S to flex at trunk-- loses her balance backwards on to table 2 times. Continue to progress as tolerated.      OBJECTIVE IMPAIRMENTS Abnormal gait, decreased activity tolerance, decreased balance, decreased mobility, difficulty walking, decreased strength, improper body mechanics, and postural dysfunction. Gait training with quad cane, patient is unstable and requires modA to help regain balance and prevent fall. Will benefit from more gait and strength training.    ACTIVITY LIMITATIONS carrying, lifting, bending, standing, squatting, stairs, transfers, bathing, toileting, and locomotion level   PARTICIPATION LIMITATIONS: meal prep, cleaning, driving, and shopping   PERSONAL FACTORS Age and Behavior pattern are also affecting patient's functional outcome.    REHAB POTENTIAL: Good   CLINICAL DECISION MAKING: Stable/uncomplicated   EVALUATION COMPLEXITY: Moderate   PLAN: PT FREQUENCY: 2x/week   PT DURATION: 10 weeks   PLANNED INTERVENTIONS: Therapeutic exercises, Therapeutic activity, Neuromuscular re-education, Balance training, Gait training, Patient/Family education, Self Care, Joint mobilization, Stair training, and Manual therapy   PLAN FOR NEXT SESSION: balance and gait SPC gait, side steps on airex with cane, calf and toe raises    9:14 AM,11/15/21 Sherol Dade PT, DPT [facility] [place]

## 2021-11-20 ENCOUNTER — Ambulatory Visit: Payer: Medicare Other

## 2021-11-20 DIAGNOSIS — R2689 Other abnormalities of gait and mobility: Secondary | ICD-10-CM

## 2021-11-20 DIAGNOSIS — R2681 Unsteadiness on feet: Secondary | ICD-10-CM

## 2021-11-20 DIAGNOSIS — R293 Abnormal posture: Secondary | ICD-10-CM | POA: Diagnosis not present

## 2021-11-20 DIAGNOSIS — R278 Other lack of coordination: Secondary | ICD-10-CM | POA: Diagnosis not present

## 2021-11-20 DIAGNOSIS — R262 Difficulty in walking, not elsewhere classified: Secondary | ICD-10-CM | POA: Diagnosis not present

## 2021-11-20 DIAGNOSIS — M6281 Muscle weakness (generalized): Secondary | ICD-10-CM | POA: Diagnosis not present

## 2021-11-20 NOTE — Therapy (Signed)
OUTPATIENT PHYSICAL THERAPY TREATMENT NOTE   Patient Name: Monique Ballard MRN: [id number] DOB:1940-03-02, 81 y.o., female Today's Date: 11/20/2021    END OF SESSION:   PT End of Session - 11/20/21 1234     Visit Number 13    Date for PT Re-Evaluation 11/26/21    Progress Note Due on Visit 20    PT Start Time 1230    PT Stop Time 1315    PT Time Calculation (min) 45 min    Equipment Utilized During Treatment Gait belt    Activity Tolerance Patient tolerated treatment well;No increased pain    Behavior During Therapy WFL for tasks assessed/performed                Past Medical History:  Diagnosis Date   Anxiety    Breast cancer ([place]) fall 1997    Left Lumpectomy, XRT.  treated with Tamoxifem and Evista   Cryptogenic stroke ([place]) 08/20/2021   Depression    Endometriosis in her 20's   surgery to help clear endometriosis to obtain a pregnancy.   GERD (gastroesophageal reflux disease)    w/"stretching" remotely   HTN (hypertension)    Loop recorder Biotronik Biomonitor III 10/03/2021 10/03/2021   Migraines    Past Surgical History:  Procedure Laterality Date   BREAST LUMPECTOMY Left fall 1997   Lymph nodes X 3 were negative, ER+, Radiation treatmetn.  Took Tamoxifem X 5 yrs then Evista for 3-5 yrs.   CESAREAN SECTION     x2  first pregnancy breach and second normal   PATENT FORAMEN OVALE(PFO) CLOSURE N/A 07/12/2021   Procedure: PATENT FORAMEN OVALE(PFO) CLOSURE;  Surgeon: Adrian Prows, MD;  Location: [facility];  Service: Cardiovascular;  Laterality: N/A;   RIGHT HEART CATH N/A 07/12/2021   Procedure: RIGHT HEART CATH;  Surgeon: Adrian Prows, MD;  Location: [facility];  Service: Cardiovascular;  Laterality: N/A;   surgery for endometriosis     Patient Active Problem List   Diagnosis Date Noted   PFO s/p closure 11/04/2021   Loop recorder Biotronik Biomonitor III 10/03/2021 10/03/2021   Cryptogenic stroke ([place]) 08/20/2021   Closed fracture of lateral  malleolus of right fibula 08/07/2021   Debility 07/15/2021   Platypnea-orthodeoxia syndrome    Polycythemia vera, acquired ([place])    Nocturnal hypoxia 07/26/2020   Alcohol dependency ([place]) 05/15/2018   Tremor 02/24/2015   Gait disorder 02/24/2015   PCP NOTES >>>>>>> 12/13/2014   Osteoporosis 10/04/2011   Annual physical exam 09/07/2010   GERD 08/28/2009   Anxiety and depression 10/06/2006   Essential hypertension 10/06/2006   BREAST CANCER, HX OF 10/06/2006    ONSET DATE: 08/21/2021    REFERRING DIAG: Diagnosis I63.9 (ICD-10-CM) - Acute ischemic stroke (HCC)    THERAPY DIAG:  Unsteadiness on feet   Muscle weakness (generalized)   Other lack of coordination   Difficulty in walking, not elsewhere classified   Abnormal posture   Rationale for Evaluation and Treatment Rehabilitation   SUBJECTIVE:                                                                            SUBJECTIVE STATEMENT: I am doing really well with my  cane around the house. Had a really bad cold last week but I am better now.    PERTINENT HISTORY: HISTORY OF PRESENT ILLNESS Monique Ballard is a 81 y.o. female with history of HTN, dementia, BRCA, migraines, alcohol dependence, anxiety and recent PFO closure to treat persistent hypoxia presenting with  acute onset expressive aphasia.  She was brought to the hospital and given TNK to treat her stroke.  Her symptoms have improved.  Patient reportedly had a similar episode of expressive aphasia about a week ago that resolved spontaneously.  She had a procedure to close a PFO in June of this year.  MRI reveals no acute abnormality, so stroke is likely to small to be seen. She has been started on 2 months of Eliquis with ASA 3m and is to follow up with cardiology outpatient as well.    PAIN:  Are you having pain? No   PRECAUTIONS: None   WEIGHT BEARING RESTRICTIONS No   FALLS: Has patient fallen in last 6 months? Yes. Number of falls 2   LIVING  ENVIRONMENT: Lives with: lives with their spouse, lives with their son, and 7 hours of personal care attendant M-F Lives in: House/apartment Stairs: Yes: External: 2 steps; can reach both Has following equipment at home: Quad cane small base, Walker - 2 wheeled, WEnvironmental consultant- 4 wheeled, Shower bench, and bed side commode   PLOF: Needs assistance with ADLs and Needs assistance with gait   PATIENT GOALS Be able to walk without the walker.   OBJECTIVE:    DIAGNOSTIC FINDINGS: DISCHARGE DIAGNOSIS: left hemispheric stroke too small to be seen on MRI versus TIA possibly embolic from recent PFO closure -device thrombosis versus transient paroxysmal A-fib s/p Iv TNk with full recovery Principal Problem:   Acute ischemic stroke (HCC) Platypnoea orthodeoxia syndrome s/p PFO closure   COGNITION: Overall cognitive status: Within functional limits for tasks assessed             SENSATION: WFL   COORDINATION: WFL U and LE   EDEMA:  None noted   MUSCLE TONE: RLE: Within functional limits     MUSCLE LENGTH: Hamstrings: Right 72 deg; Left 81 deg   POSTURE: rounded shoulders and forward head   LOWER EXTREMITY ROM:    WFL BLE, including B ankles     LOWER EXTREMITY MMT:     MMT Right Eval Left Eval  Hip flexion 4 4  Hip extension 4 4  Hip abduction 4 4  Hip adduction      Hip internal rotation      Hip external rotation      Knee flexion 4 4  Knee extension 4 4  Ankle dorsiflexion 4 2  Ankle plantarflexion   2+  Ankle inversion      Ankle eversion      (Blank rows = not tested)   BED MOBILITY:  I   TRANSFERS: Assistive device utilized: WEnvironmental consultant- 2 wheeled  Sit to stand: SBA Stand to sit: SBA Chair to chair: SBA   CURB:  Level of Assistance: Min A Assistive device utilized: WEnvironmental consultant- 2 wheeled   STAIRS:           Level of Assistance: CGA           Stair Negotiation Technique: Step to Pattern with Bilateral Rails           Number of Stairs: 2              GAIT:  Gait  pattern: step through pattern, decreased ankle dorsiflexion- Left, Left steppage, Left foot flat, and poor foot clearance- Left Distance walked: 80 with walker, 32f with SPC Assistive device utilized: Walker - 2 wheeled Level of assistance: CGA Comments: Pt had 1 near LOB with SPC but was able to recover independently. PT provided CGA for safety. Pt has improved coordination with cane placement    FUNCTIONAL TESTs:  5 times sit to stand: 9 sec (10/31/21) Timed up and go (TUG): 19.57 Functional gait assessment: TBD   PATIENT SURVEYS:  FOTO 78 (10/31/21)   TODAY'S TREATMENT:  11/20/21 Bike L3 x659ms  SPC gait 3 big laps  side steps on airex with cane- CGA calf and toe raises on black bar 2x10  Lateral steps w/band in front of mat x10 Walking on beam  Standing on airex feet together, half tandem 30s each Tandem on airex 8s on L and 10s on R     11/08/21 NuStep L5 x6m73m  TUG-13.57s Step ups on airex with cane  Resisted gait forwards 10#  Walking with cane 2 laps in gym  STS holding yellow ball 2x10  2 way hip 2# 2x10  Calf raises 2# 2x10  11/06/21 NuStep L5 x6mi15m Step ups 6" x10 each leg S2S on airex 2x10  Walking with quad cane Leg ext 10# 2x10 HS curls 20# 2x10  10/31/21 Tandem walking on airex pad in // bars x4 reps CGA Tandem stance, firm surface, 2x30 sec CGA Feet together airex 2x30 sec EO, x30 sec EC CGA Weight shifting on trampoline intermittent UE assist, CGA x30 sec each Sit to stand holding blue weighted ball 3x5 reps  Sidestepping with yellow TB around feet in // bars down/back x4 Nustep L5 x5 min Ambulation SPC x80ft4f, PT cuing for proper SPC placement initially   10/18/21 NuStep L5 x6mins5mlking w/o AD 3 laps, 1 instance of foot catching and minA to prevent tripping and falling  Side steps w/o AD 2x30ft w20fminA  Fitter 2x10 each leg  Standing on airex cone taps with UUE support Feet together on airex 30s  Half tandem on airex 30s Step  ups on airex    10/16/21 NuStep L5 x 6 minutes Standing lateral weight shifts on airex beam with BUE support on parallel bars, then L foot only B side step x4 on Aires beam SLS in parallel bars, approx 5 sec on R, 2 sec on L SLS mini squats to heel raise in parallel bars, 4 on L, 5 on R. Standing on Airex pad, alternately tapping each foot on 4" step, with UUE support. Occasional mild LOB. Standing hip abd, ext, flexion, 2 x 10 reps each, with 2# weights at ankles, BUE support Ambulation while holding 3# WATE bar in BUE, 2 x 75', CGA, excessive hip flexion to clear L toes, but stable.     10/11/21 Nustep L4 x6mins W33ming with cane 1 big loop  Walking w/o AD Step up on airex 2HHA  Standing on airex holding 2# WaTE 30s Step up 6" from airex 2HHA at stairs  Taps on 6" standing on airex only 1 HHA Placing cones and picking them up on 6" step then did standing on airex minA  LAQ 3# 2x10 Calf raises 2x10 w/2HHA            PATIENT EDUCATION: Education details: exercise technique Person educated: Patient  Education method: Explanation Education comprehension: verbalized understanding     HOME EXERCISE PROGRAM:  [id number]  GOALS: Goals reviewed with patient? Yes   SHORT TERM GOALS: Target date: 10/15/2021   I with basic HEP Baseline: Goal status: MET   2.  Determine whether to pursue AFO for L foot drop Baseline: will receive on 11/3 Goal status: MET   LONG TERM GOALS: Target date: 11/26/2021   I with final HEP Baseline:  Goal status: INITIAL   2.  Patient will perform all functional transfers MI Baseline: CGA Goal status: INITIAL   3.  Patient will perform 5 x STS in < 12 sec to demonstrate improved functional strength and control Baseline: 9 sec Goal status: MET   4.  Perform TUG in < 12 sec to demonstrate improved balance/decreased fall risk. Baseline: 19.57, 13.57s- 10/19 Goal status: progressing   5.  Patient will ambulate x at least 500' functionally  on indoor surfaces with LRAD, any necessary bracing for L ankle, MI Baseline: CGA, RW, foot drop, unsteady. Goal status: INITIAL   6. Increase FOTO to at least 68            Baseline 78            Goal Status MET     ASSESSMENT:   CLINICAL IMPRESSION: Patient is doing better walking with quad cane, better speed and no stumbles or tripping. Does slap L foot once she begins to fatigue. Still has difficulty on airex due to decrease balance, will benefit from more balance training.      OBJECTIVE IMPAIRMENTS Abnormal gait, decreased activity tolerance, decreased balance, decreased mobility, difficulty walking, decreased strength, improper body mechanics, and postural dysfunction. Gait training with quad cane, patient is unstable and requires modA to help regain balance and prevent fall. Will benefit from more gait and strength training.    ACTIVITY LIMITATIONS carrying, lifting, bending, standing, squatting, stairs, transfers, bathing, toileting, and locomotion level   PARTICIPATION LIMITATIONS: meal prep, cleaning, driving, and shopping   PERSONAL FACTORS Age and Behavior pattern are also affecting patient's functional outcome.    REHAB POTENTIAL: Good   CLINICAL DECISION MAKING: Stable/uncomplicated   EVALUATION COMPLEXITY: Moderate   PLAN: PT FREQUENCY: 2x/week   PT DURATION: 10 weeks   PLANNED INTERVENTIONS: Therapeutic exercises, Therapeutic activity, Neuromuscular re-education, Balance training, Gait training, Patient/Family education, Self Care, Joint mobilization, Stair training, and Manual therapy   PLAN FOR NEXT SESSION: balance and gait SPC gait, side steps on airex with cane, calf and toe raises    1:12 PM,11/20/21 Sherol Dade PT, DPT [facility] at [place]

## 2021-11-21 NOTE — Therapy (Signed)
OUTPATIENT PHYSICAL THERAPY TREATMENT NOTE   Patient Name: Monique Ballard MRN: [id number] DOB:01/09/1941, 81 y.o., female Today's Date: 11/21/2021    END OF SESSION:        Past Medical History:  Diagnosis Date   Anxiety    Breast cancer ([place]) fall 1997    Left Lumpectomy, XRT.  treated with Tamoxifem and Evista   Cryptogenic stroke ([place]) 08/20/2021   Depression    Endometriosis in her 20's   surgery to help clear endometriosis to obtain a pregnancy.   GERD (gastroesophageal reflux disease)    w/"stretching" remotely   HTN (hypertension)    Loop recorder Biotronik Biomonitor III 10/03/2021 10/03/2021   Migraines    Past Surgical History:  Procedure Laterality Date   BREAST LUMPECTOMY Left fall 1997   Lymph nodes X 3 were negative, ER+, Radiation treatmetn.  Took Tamoxifem X 5 yrs then Evista for 3-5 yrs.   CESAREAN SECTION     x2  first pregnancy breach and second normal   PATENT FORAMEN OVALE(PFO) CLOSURE N/A 07/12/2021   Procedure: PATENT FORAMEN OVALE(PFO) CLOSURE;  Surgeon: Adrian Prows, MD;  Location: [facility];  Service: Cardiovascular;  Laterality: N/A;   RIGHT HEART CATH N/A 07/12/2021   Procedure: RIGHT HEART CATH;  Surgeon: Adrian Prows, MD;  Location: [facility];  Service: Cardiovascular;  Laterality: N/A;   surgery for endometriosis     Patient Active Problem List   Diagnosis Date Noted   PFO s/p closure 11/04/2021   Loop recorder Biotronik Biomonitor III 10/03/2021 10/03/2021   Cryptogenic stroke ([place]) 08/20/2021   Closed fracture of lateral malleolus of right fibula 08/07/2021   Debility 07/15/2021   Platypnea-orthodeoxia syndrome    Polycythemia vera, acquired ([place])    Nocturnal hypoxia 07/26/2020   Alcohol dependency ([place]) 05/15/2018   Tremor 02/24/2015   Gait disorder 02/24/2015   PCP NOTES >>>>>>> 12/13/2014   Osteoporosis 10/04/2011   Annual physical exam 09/07/2010   GERD 08/28/2009   Anxiety and depression 10/06/2006    Essential hypertension 10/06/2006   BREAST CANCER, HX OF 10/06/2006    ONSET DATE: 08/21/2021    REFERRING DIAG: Diagnosis I63.9 (ICD-10-CM) - Acute ischemic stroke (HCC)    THERAPY DIAG:  Unsteadiness on feet   Muscle weakness (generalized)   Other lack of coordination   Difficulty in walking, not elsewhere classified   Abnormal posture   Rationale for Evaluation and Treatment Rehabilitation   SUBJECTIVE:                                                                            SUBJECTIVE STATEMENT: Nothing new, getting my AFO tomorrow.    PERTINENT HISTORY: HISTORY OF PRESENT ILLNESS Ms. Monique Ballard is a 81 y.o. female with history of HTN, dementia, BRCA, migraines, alcohol dependence, anxiety and recent PFO closure to treat persistent hypoxia presenting with  acute onset expressive aphasia.  She was brought to the hospital and given TNK to treat her stroke.  Her symptoms have improved.  Patient reportedly had a similar episode of expressive aphasia about a week ago that resolved spontaneously.  She had a procedure to close a PFO in June of this year.  MRI  reveals no acute abnormality, so stroke is likely to small to be seen. She has been started on 2 months of Eliquis with ASA 19m and is to follow up with cardiology outpatient as well.    PAIN:  Are you having pain? No   PRECAUTIONS: None   WEIGHT BEARING RESTRICTIONS No   FALLS: Has patient fallen in last 6 months? Yes. Number of falls 2   LIVING ENVIRONMENT: Lives with: lives with their spouse, lives with their son, and 7 hours of personal care attendant M-F Lives in: House/apartment Stairs: Yes: External: 2 steps; can reach both Has following equipment at home: Quad cane small base, Walker - 2 wheeled, WEnvironmental consultant- 4 wheeled, Shower bench, and bed side commode   PLOF: Needs assistance with ADLs and Needs assistance with gait   PATIENT GOALS Be able to walk without the walker.   OBJECTIVE:    DIAGNOSTIC  FINDINGS: DISCHARGE DIAGNOSIS: left hemispheric stroke too small to be seen on MRI versus TIA possibly embolic from recent PFO closure -device thrombosis versus transient paroxysmal A-fib s/p Iv TNk with full recovery Principal Problem:   [place] Platypnoea orthodeoxia syndrome s/p PFO closure   COGNITION: Overall cognitive status: Within functional limits for tasks assessed             SENSATION: WFL   COORDINATION: WFL U and LE   EDEMA:  None noted   MUSCLE TONE: RLE: Within functional limits     MUSCLE LENGTH: Hamstrings: Right 72 deg; Left 81 deg   POSTURE: rounded shoulders and forward head   LOWER EXTREMITY ROM:    WFL BLE, including B ankles     LOWER EXTREMITY MMT:     MMT Right Eval Left Eval  Hip flexion 4 4  Hip extension 4 4  Hip abduction 4 4  Hip adduction      Hip internal rotation      Hip external rotation      Knee flexion 4 4  Knee extension 4 4  Ankle dorsiflexion 4 2  Ankle plantarflexion   2+  Ankle inversion      Ankle eversion      (Blank rows = not tested)   BED MOBILITY:  I   TRANSFERS: Assistive device utilized: WEnvironmental consultant- 2 wheeled  Sit to stand: SBA Stand to sit: SBA Chair to chair: SBA   CURB:  Level of Assistance: Min A Assistive device utilized: WEnvironmental consultant- 2 wheeled   STAIRS:           Level of Assistance: CGA           Stair Negotiation Technique: Step to Pattern with Bilateral Rails           Number of Stairs: 2              GAIT: Gait pattern: step through pattern, decreased ankle dorsiflexion- Left, Left steppage, Left foot flat, and poor foot clearance- Left Distance walked: 80 with walker, 833fwith SPC Assistive device utilized: Walker - 2 wheeled Level of assistance: CGA Comments: Pt had 1 near LOB with SPC but was able to recover independently. PT provided CGA for safety. Pt has improved coordination with cane placement    FUNCTIONAL TESTs:  5 times sit to stand: 9 sec (10/31/21) Timed  up and go (TUG): 19.57 Functional gait assessment: TBD   PATIENT SURVEYS:  FOTO 78 (10/31/21)   TODAY'S TREATMENT:  11/22/21 NuStep L5 x6m29m SPC walking 3 big laps  Leg ext 10# 3x10 HS curls 20# 3x10  Resisted side steps 10#  x4 each way- CGA Step ups from airex holding SPC x10 on each side  S2S on airex 2x10  Catch while standing on airex    11/20/21 Bike L3 x13mns  SPC gait 3 big laps  side steps on airex with cane- CGA calf and toe raises on black bar 2x10  Lateral steps w/band in front of mat x10 Walking on beam  Standing on airex feet together, half tandem 30s each Tandem on airex 8s on L and 10s on R     11/08/21 NuStep L5 x638ms  TUG-13.57s Step ups on airex with cane  Resisted gait forwards 10#  Walking with cane 2 laps in gym  STS holding yellow ball 2x10  2 way hip 2# 2x10  Calf raises 2# 2x10  11/06/21 NuStep L5 x6m51m  Step ups 6" x10 each leg S2S on airex 2x10  Walking with quad cane Leg ext 10# 2x10 HS curls 20# 2x10  10/31/21 Tandem walking on airex pad in // bars x4 reps CGA Tandem stance, firm surface, 2x30 sec CGA Feet together airex 2x30 sec EO, x30 sec EC CGA Weight shifting on trampoline intermittent UE assist, CGA x30 sec each Sit to stand holding blue weighted ball 3x5 reps  Sidestepping with yellow TB around feet in // bars down/back x4 Nustep L5 x5 min Ambulation SPC x80f6fA, PT cuing for proper SPC placement initially   10/18/21 NuStep L5 x6min57malking w/o AD 3 laps, 1 instance of foot catching and minA to prevent tripping and falling  Side steps w/o AD 2x30ft 40f minA  Fitter 2x10 each leg  Standing on airex cone taps with UUE support Feet together on airex 30s  Half tandem on airex 30s Step ups on airex    10/16/21 NuStep L5 x 6 minutes Standing lateral weight shifts on airex beam with BUE support on parallel bars, then L foot only B side step x4 on Aires beam SLS in parallel bars, approx 5 sec on R, 2 sec on  L SLS mini squats to heel raise in parallel bars, 4 on L, 5 on R. Standing on Airex pad, alternately tapping each foot on 4" step, with UUE support. Occasional mild LOB. Standing hip abd, ext, flexion, 2 x 10 reps each, with 2# weights at ankles, BUE support Ambulation while holding 3# WATE bar in BUE, 2 x 75', CGA, excessive hip flexion to clear L toes, but stable.     10/11/21 Nustep L4 x6mins 25mking with cane 1 big loop  Walking w/o AD Step up on airex 2HHA  Standing on airex holding 2# WaTE 30s Step up 6" from airex 2HHA at stairs  Taps on 6" standing on airex only 1 HHA Placing cones and picking them up on 6" step then did standing on airex minA  LAQ 3# 2x10 Calf raises 2x10 w/2HHA            PATIENT EDUCATION: Education details: exercise technique Person educated: Patient  Education method: Explanation Education comprehension: verbalized understanding     HOME EXERCISE PROGRAM:  [id number]   GOALS: Goals reviewed with patient? Yes   SHORT TERM GOALS: Target date: 10/15/2021   I with basic HEP Baseline: Goal status: MET   2.  Determine whether to pursue AFO for L foot drop Baseline: will receive on 11/3 Goal status: MET   LONG TERM GOALS: Target date: 12/27/2021   I with final HEP Baseline:  Goal status: INITIAL   2.  Patient will perform all functional transfers MI Baseline: CGA Goal status: INITIAL   3.  Patient will perform 5 x STS in < 12 sec to demonstrate improved functional strength and control Baseline: 9 sec Goal status: MET   4.  Perform TUG in < 12 sec to demonstrate improved balance/decreased fall risk. Baseline: 19.57, 13.57s- 10/19, 11.81s-11/2 Goal status: MET   5.  Patient will ambulate x at least 500' functionally on indoor surfaces with LRAD, any necessary bracing for L ankle, MI Baseline: CGA, RW, foot drop, unsteady. Gets AFO on 11/3 Goal status: progressing   6. Increase FOTO to at least 68            Baseline 78             Goal Status MET     ASSESSMENT:   CLINICAL IMPRESSION: Patient is doing better walking with quad cane, better speed and no stumbles or tripping. Did more LE strengthening today. Difficulty with resisted gait, loses balance requires minA to reset. Practiced more on airex today to help with balance, she is hesitant but does well the more she practices. Patient has met majority of her goals, will re-cert for 4 more weeks 1x/week to work on gait training with AFO and balance.      OBJECTIVE IMPAIRMENTS Abnormal gait, decreased activity tolerance, decreased balance, decreased mobility, difficulty walking, decreased strength, improper body mechanics, and postural dysfunction. Gait training with quad cane, patient is unstable and requires modA to help regain balance and prevent fall. Will benefit from more gait and strength training.    ACTIVITY LIMITATIONS carrying, lifting, bending, standing, squatting, stairs, transfers, bathing, toileting, and locomotion level   PARTICIPATION LIMITATIONS: meal prep, cleaning, driving, and shopping   PERSONAL FACTORS Age and Behavior pattern are also affecting patient's functional outcome.    REHAB POTENTIAL: Good   CLINICAL DECISION MAKING: Stable/uncomplicated   EVALUATION COMPLEXITY: Moderate   PLAN: PT FREQUENCY: 2x/week   PT DURATION: 10 weeks   PLANNED INTERVENTIONS: Therapeutic exercises, Therapeutic activity, Neuromuscular re-education, Balance training, Gait training, Patient/Family education, Self Care, Joint mobilization, Stair training, and Manual therapy   PLAN FOR NEXT SESSION: balance and gait SPC gait, side steps on airex with cane, calf and toe raises    2:03 PM,11/21/21 Sherol Dade PT, DPT [facility]

## 2021-11-22 ENCOUNTER — Ambulatory Visit: Payer: Medicare Other | Attending: Neurology

## 2021-11-22 DIAGNOSIS — R278 Other lack of coordination: Secondary | ICD-10-CM | POA: Diagnosis not present

## 2021-11-22 DIAGNOSIS — M6281 Muscle weakness (generalized): Secondary | ICD-10-CM | POA: Insufficient documentation

## 2021-11-22 DIAGNOSIS — R2681 Unsteadiness on feet: Secondary | ICD-10-CM | POA: Diagnosis not present

## 2021-11-22 DIAGNOSIS — R2689 Other abnormalities of gait and mobility: Secondary | ICD-10-CM | POA: Insufficient documentation

## 2021-11-22 DIAGNOSIS — R262 Difficulty in walking, not elsewhere classified: Secondary | ICD-10-CM | POA: Insufficient documentation

## 2021-11-27 ENCOUNTER — Telehealth: Payer: Self-pay

## 2021-11-27 ENCOUNTER — Other Ambulatory Visit: Payer: Self-pay

## 2021-11-27 NOTE — Patient Outreach (Signed)
  First telephone outreach attempt to obtain mRS. No answer. Left message for returned call.  Monique Ballard THN-Care Management Assistant [phone]  

## 2021-11-27 NOTE — Telephone Encounter (Signed)
Received AFO order from [facility]. Order signed and faxed back to [phone]. Orders sent for scanning.

## 2021-11-28 ENCOUNTER — Encounter: Payer: Self-pay | Admitting: Physical Therapy

## 2021-11-28 ENCOUNTER — Ambulatory Visit: Payer: Medicare Other | Admitting: Physical Therapy

## 2021-11-28 DIAGNOSIS — R278 Other lack of coordination: Secondary | ICD-10-CM | POA: Diagnosis not present

## 2021-11-28 DIAGNOSIS — R2681 Unsteadiness on feet: Secondary | ICD-10-CM

## 2021-11-28 DIAGNOSIS — R2689 Other abnormalities of gait and mobility: Secondary | ICD-10-CM | POA: Diagnosis not present

## 2021-11-28 DIAGNOSIS — M6281 Muscle weakness (generalized): Secondary | ICD-10-CM | POA: Diagnosis not present

## 2021-11-28 DIAGNOSIS — R262 Difficulty in walking, not elsewhere classified: Secondary | ICD-10-CM

## 2021-11-28 NOTE — Therapy (Signed)
OUTPATIENT PHYSICAL THERAPY TREATMENT NOTE   Patient Name: Monique Ballard MRN: [id number] DOB:Aug 01, 1940, 81 y.o., female Today's Date: 11/28/2021    END OF SESSION:   PT End of Session - 11/28/21 1515     Visit Number 15    Date for PT Re-Evaluation 12/27/21    PT Start Time 3532    PT Stop Time 1600    PT Time Calculation (min) 45 min    Activity Tolerance Patient tolerated treatment well;No increased pain    Behavior During Therapy WFL for tasks assessed/performed                 Past Medical History:  Diagnosis Date   Anxiety    Breast cancer ([place]) fall 1997    Left Lumpectomy, XRT.  treated with Tamoxifem and Evista   Cryptogenic stroke ([place]) 08/20/2021   Depression    Endometriosis in her 20's   surgery to help clear endometriosis to obtain a pregnancy.   GERD (gastroesophageal reflux disease)    w/"stretching" remotely   HTN (hypertension)    Loop recorder Biotronik Biomonitor III 10/03/2021 10/03/2021   Migraines    Past Surgical History:  Procedure Laterality Date   BREAST LUMPECTOMY Left fall 1997   Lymph nodes X 3 were negative, ER+, Radiation treatmetn.  Took Tamoxifem X 5 yrs then Evista for 3-5 yrs.   CESAREAN SECTION     x2  first pregnancy breach and second normal   PATENT FORAMEN OVALE(PFO) CLOSURE N/A 07/12/2021   Procedure: PATENT FORAMEN OVALE(PFO) CLOSURE;  Surgeon: Adrian Prows, MD;  Location: [facility];  Service: Cardiovascular;  Laterality: N/A;   RIGHT HEART CATH N/A 07/12/2021   Procedure: RIGHT HEART CATH;  Surgeon: Adrian Prows, MD;  Location: [facility];  Service: Cardiovascular;  Laterality: N/A;   surgery for endometriosis     Patient Active Problem List   Diagnosis Date Noted   PFO s/p closure 11/04/2021   Loop recorder Biotronik Biomonitor III 10/03/2021 10/03/2021   Cryptogenic stroke ([place]) 08/20/2021   Closed fracture of lateral malleolus of right fibula 08/07/2021   Debility 07/15/2021    Platypnea-orthodeoxia syndrome    Polycythemia vera, acquired ([place])    Nocturnal hypoxia 07/26/2020   Alcohol dependency ([place]) 05/15/2018   Tremor 02/24/2015   Gait disorder 02/24/2015   PCP NOTES >>>>>>> 12/13/2014   Osteoporosis 10/04/2011   Annual physical exam 09/07/2010   GERD 08/28/2009   Anxiety and depression 10/06/2006   Essential hypertension 10/06/2006   BREAST CANCER, HX OF 10/06/2006    ONSET DATE: 08/21/2021    REFERRING DIAG: Diagnosis I63.9 (ICD-10-CM) - Acute ischemic stroke ([place])    THERAPY DIAG:  Unsteadiness on feet   Muscle weakness (generalized)   Other lack of coordination   Difficulty in walking, not elsewhere classified   Abnormal posture   Rationale for Evaluation and Treatment Rehabilitation   SUBJECTIVE:                                                                            SUBJECTIVE STATEMENT: "Feeling fine" Had AFO since Friday has not noticed a difference   PERTINENT HISTORY: HISTORY OF PRESENT ILLNESS Monique Ballard is  a 81 y.o. female with history of HTN, dementia, BRCA, migraines, alcohol dependence, anxiety and recent PFO closure to treat persistent hypoxia presenting with  acute onset expressive aphasia.  She was brought to the hospital and given TNK to treat her stroke.  Her symptoms have improved.  Patient reportedly had a similar episode of expressive aphasia about a week ago that resolved spontaneously.  She had a procedure to close a PFO in June of this year.  MRI reveals no acute abnormality, so stroke is likely to small to be seen. She has been started on 2 months of Eliquis with ASA 99m and is to follow up with cardiology outpatient as well.    PAIN:  Are you having pain? No   PRECAUTIONS: None   WEIGHT BEARING RESTRICTIONS No   FALLS: Has patient fallen in last 6 months? Yes. Number of falls 2   LIVING ENVIRONMENT: Lives with: lives with their spouse, lives with their son, and 7 hours of personal care attendant  M-F Lives in: House/apartment Stairs: Yes: External: 2 steps; can reach both Has following equipment at home: Quad cane small base, Walker - 2 wheeled, WEnvironmental consultant- 4 wheeled, Shower bench, and bed side commode   PLOF: Needs assistance with ADLs and Needs assistance with gait   PATIENT GOALS Be able to walk without the walker.   OBJECTIVE:    DIAGNOSTIC FINDINGS: DISCHARGE DIAGNOSIS: left hemispheric stroke too small to be seen on MRI versus TIA possibly embolic from recent PFO closure -device thrombosis versus transient paroxysmal A-fib s/p Iv TNk with full recovery Principal Problem:   [place] Platypno[place] orthodeoxia syndrome s/p PFO closure   COGNITION: Overall cognitive status: Within functional limits for tasks assessed             SENSATION: WFL   COORDINATION: WFL U and LE   EDEMA:  None noted   MUSCLE TONE: RLE: Within functional limits     MUSCLE LENGTH: Hamstrings: Right 72 deg; Left 81 deg   POSTURE: rounded shoulders and forward head   LOWER EXTREMITY ROM:    WFL BLE, including B ankles     LOWER EXTREMITY MMT:     MMT Right Eval Left Eval  Hip flexion 4 4  Hip extension 4 4  Hip abduction 4 4  Hip adduction      Hip internal rotation      Hip external rotation      Knee flexion 4 4  Knee extension 4 4  Ankle dorsiflexion 4 2  Ankle plantarflexion   2+  Ankle inversion      Ankle eversion      (Blank rows = not tested)   BED MOBILITY:  I   TRANSFERS: Assistive device utilized: WEnvironmental consultant- 2 wheeled  Sit to stand: SBA Stand to sit: SBA Chair to chair: SBA   CURB:  Level of Assistance: Min A Assistive device utilized: WEnvironmental consultant- 2 wheeled   STAIRS:           Level of Assistance: CGA           Stair Negotiation Technique: Step to Pattern with Bilateral Rails           Number of Stairs: 2              GAIT: Gait pattern: step through pattern, decreased ankle dorsiflexion- Left, Left steppage, Left foot flat, and poor foot  clearance- Left Distance walked: 80 with walker, 827fwith SPC Assistive device  utilized: Environmental consultant - 2 wheeled Level of assistance: CGA Comments: Pt had 1 near LOB with SPC but was able to recover independently. PT provided CGA for safety. Pt has improved coordination with cane placement    FUNCTIONAL TESTs:  5 times sit to stand: 9 sec (10/31/21) Timed up and go (TUG): 19.57 Functional gait assessment: TBD   PATIENT SURVEYS:  FOTO 78 (10/31/21)   TODAY'S TREATMENT:  11/28/21 NuStep L5 x 6 min Gait outside L side island and front island with and without cane. Some steppage gait w/ LLE as she fatigue with outdoor ambulation. Pt Required HHA x1 with cane to negotiate curb S2S on airex 2x10 Alt 4in box taps CGA HS curls 20lb 2x10 Leg Ext 5lb 2x10 Standing ball toss  11/22/21 NuStep L5 x56mns SPC walking 3 big laps  Leg ext 10# 3x10 HS curls 20# 3x10  Resisted side steps 10#  x4 each way- CGA Step ups from airex holding SPC x10 on each side  S2S on airex 2x10  Catch while standing on airex    11/20/21 Bike L3 x629ms  SPC gait 3 big laps  side steps on airex with cane- CGA calf and toe raises on black bar 2x10  Lateral steps w/band in front of mat x10 Walking on beam  Standing on airex feet together, half tandem 30s each Tandem on airex 8s on L and 10s on R     11/08/21 NuStep L5 x6m78m  TUG-13.57s Step ups on airex with cane  Resisted gait forwards 10#  Walking with cane 2 laps in gym  STS holding yellow ball 2x10  2 way hip 2# 2x10  Calf raises 2# 2x10  11/06/21 NuStep L5 x6mi10m Step ups 6" x10 each leg S2S on airex 2x10  Walking with quad cane Leg ext 10# 2x10 HS curls 20# 2x10  10/31/21 Tandem walking on airex pad in // bars x4 reps CGA Tandem stance, firm surface, 2x30 sec CGA Feet together airex 2x30 sec EO, x30 sec EC CGA Weight shifting on trampoline intermittent UE assist, CGA x30 sec each Sit to stand holding blue weighted ball 3x5 reps   Sidestepping with yellow TB around feet in // bars down/back x4 Nustep L5 x5 min Ambulation SPC x80ft45f, PT cuing for proper SPC placement initially   10/18/21 NuStep L5 x6mins37mlking w/o AD 3 laps, 1 instance of foot catching and minA to prevent tripping and falling  Side steps w/o AD 2x30ft w50fminA  Fitter 2x10 each leg  Standing on airex cone taps with UUE support Feet together on airex 30s  Half tandem on airex 30s Step ups on airex    10/16/21 NuStep L5 x 6 minutes Standing lateral weight shifts on airex beam with BUE support on parallel bars, then L foot only B side step x4 on Aires beam SLS in parallel bars, approx 5 sec on R, 2 sec on L SLS mini squats to heel raise in parallel bars, 4 on L, 5 on R. Standing on Airex pad, alternately tapping each foot on 4" step, with UUE support. Occasional mild LOB. Standing hip abd, ext, flexion, 2 x 10 reps each, with 2# weights at ankles, BUE support Ambulation while holding 3# WATE bar in BUE, 2 x 75', CGA, excessive hip flexion to clear L toes, but stable.     10/11/21 Nustep L4 x6mins W24ming with cane 1 big loop  Walking w/o AD Step up on airex 2HHA  Standing on airex holding 2# WaTE  30s Step up 6" from airex 2HHA at stairs  Taps on 6" standing on airex only 1 HHA Placing cones and picking them up on 6" step then did standing on airex minA  LAQ 3# 2x10 Calf raises 2x10 w/2HHA            PATIENT EDUCATION: Education details: exercise technique Person educated: Patient  Education method: Explanation Education comprehension: verbalized understanding     HOME EXERCISE PROGRAM:  [id number]   GOALS: Goals reviewed with patient? Yes   SHORT TERM GOALS: Target date: 10/15/2021   I with basic HEP Baseline: Goal status: MET   2.  Determine whether to pursue AFO for L foot drop Baseline: will receive on 11/3 Goal status: MET   LONG TERM GOALS: Target date: 12/27/2021   I with final HEP Baseline:  Goal  status: INITIAL   2.  Patient will perform all functional transfers MI Baseline: CGA Goal status: INITIAL   3.  Patient will perform 5 x STS in < 12 sec to demonstrate improved functional strength and control Baseline: 9 sec Goal status: MET   4.  Perform TUG in < 12 sec to demonstrate improved balance/decreased fall risk. Baseline: 19.57, 13.57s- 10/19, 11.81s-11/2 Goal status: MET   5.  Patient will ambulate x at least 500' functionally on indoor surfaces with LRAD, any necessary bracing for L ankle, MI Baseline: CGA, RW, foot drop, unsteady. Gets AFO on 11/3 Goal status: progressing   6. Increase FOTO to at least 68            Baseline 78            Goal Status MET     ASSESSMENT:   CLINICAL IMPRESSION: Pt enters doing well with her AFO donned. Progressed to some outdoor ambulation. Cue for proper cane use, pt would only use cane on every third step.  When ambulating without caen she would veer to the R at times. Pt very hesitant with curb negotiation requiring HHA x1. Heistation present with alt box taps. Pt had good trunk control with ball toss but would not take a step with feet to maintain balance.     OBJECTIVE IMPAIRMENTS Abnormal gait, decreased activity tolerance, decreased balance, decreased mobility, difficulty walking, decreased strength, improper body mechanics, and postural dysfunction. Gait training with quad cane, patient is unstable and requires modA to help regain balance and prevent fall. Will benefit from more gait and strength training.    ACTIVITY LIMITATIONS carrying, lifting, bending, standing, squatting, stairs, transfers, bathing, toileting, and locomotion level   PARTICIPATION LIMITATIONS: meal prep, cleaning, driving, and shopping   PERSONAL FACTORS Age and Behavior pattern are also affecting patient's functional outcome.    REHAB POTENTIAL: Good   CLINICAL DECISION MAKING: Stable/uncomplicated   EVALUATION COMPLEXITY: Moderate   PLAN: PT  FREQUENCY: 2x/week   PT DURATION: 10 weeks   PLANNED INTERVENTIONS: Therapeutic exercises, Therapeutic activity, Neuromuscular re-education, Balance training, Gait training, Patient/Family education, Self Care, Joint mobilization, Stair training, and Manual therapy   PLAN FOR NEXT SESSION: balance and gait SPC gait, side steps on airex with cane, calf and toe raises    3:15 PM,11/28/21 Sherol Dade PT, DPT [facility] [place]

## 2021-11-30 ENCOUNTER — Other Ambulatory Visit: Payer: Self-pay

## 2021-11-30 NOTE — Patient Outreach (Signed)
   Second telephone outreach attempt to obtain mRS. No answer. Left message for returned call.  Antoinette Borgwardt THN-Care Management Assistant [phone]  

## 2021-12-03 DIAGNOSIS — Z4509 Encounter for adjustment and management of other cardiac device: Secondary | ICD-10-CM | POA: Diagnosis not present

## 2021-12-03 DIAGNOSIS — Z95818 Presence of other cardiac implants and grafts: Secondary | ICD-10-CM | POA: Diagnosis not present

## 2021-12-03 DIAGNOSIS — I639 Cerebral infarction, unspecified: Secondary | ICD-10-CM | POA: Diagnosis not present

## 2021-12-04 ENCOUNTER — Other Ambulatory Visit: Payer: Self-pay

## 2021-12-04 NOTE — Patient Outreach (Signed)
   3 outreach attempts were completed to obtain mRs. mRs could not be obtained because patient never returned my calls. mRs=7    Monique Ballard Care Management Assistant [phone]  

## 2021-12-04 NOTE — Therapy (Signed)
OUTPATIENT PHYSICAL THERAPY TREATMENT NOTE   Patient Name: Monique Ballard MRN: [id number] DOB:1940/11/19, 81 y.o., female Today's Date: 12/05/2021    END OF SESSION:   PT End of Session - 12/05/21 1058     Visit Number 16    Date for PT Re-Evaluation 12/27/21    PT Start Time 1055    PT Stop Time 1140    PT Time Calculation (min) 45 min    Activity Tolerance Patient tolerated treatment well;No increased pain    Behavior During Therapy WFL for tasks assessed/performed                  Past Medical History:  Diagnosis Date   Anxiety    Breast cancer ([place]) fall 1997    Left Lumpectomy, XRT.  treated with Tamoxifem and Evista   Cryptogenic stroke ([place]) 08/20/2021   Depression    Endometriosis in her 20's   surgery to help clear endometriosis to obtain a pregnancy.   GERD (gastroesophageal reflux disease)    w/"stretching" remotely   HTN (hypertension)    Loop recorder Biotronik Biomonitor III 10/03/2021 10/03/2021   Migraines    Past Surgical History:  Procedure Laterality Date   BREAST LUMPECTOMY Left fall 1997   Lymph nodes X 3 were negative, ER+, Radiation treatmetn.  Took Tamoxifem X 5 yrs then Evista for 3-5 yrs.   CESAREAN SECTION     x2  first pregnancy breach and second normal   PATENT FORAMEN OVALE(PFO) CLOSURE N/A 07/12/2021   Procedure: PATENT FORAMEN OVALE(PFO) CLOSURE;  Surgeon: Adrian Prows, MD;  Location: [facility];  Service: Cardiovascular;  Laterality: N/A;   RIGHT HEART CATH N/A 07/12/2021   Procedure: RIGHT HEART CATH;  Surgeon: Adrian Prows, MD;  Location: [facility];  Service: Cardiovascular;  Laterality: N/A;   surgery for endometriosis     Patient Active Problem List   Diagnosis Date Noted   PFO s/p closure 11/04/2021   Loop recorder Biotronik Biomonitor III 10/03/2021 10/03/2021   Cryptogenic stroke ([place]) 08/20/2021   Closed fracture of lateral malleolus of right fibula 08/07/2021   Debility 07/15/2021    Platypnea-orthodeoxia syndrome    Polycythemia vera, acquired ([place])    Nocturnal hypoxia 07/26/2020   Alcohol dependency ([place]) 05/15/2018   Tremor 02/24/2015   Gait disorder 02/24/2015   PCP NOTES >>>>>>> 12/13/2014   Osteoporosis 10/04/2011   Annual physical exam 09/07/2010   GERD 08/28/2009   Anxiety and depression 10/06/2006   Essential hypertension 10/06/2006   BREAST CANCER, HX OF 10/06/2006    ONSET DATE: 08/21/2021    REFERRING DIAG: Diagnosis I63.9 (ICD-10-CM) - Acute ischemic stroke (HCC)    THERAPY DIAG:  Unsteadiness on feet   Muscle weakness (generalized)   Other lack of coordination   Difficulty in walking, not elsewhere classified   Abnormal posture   Rationale for Evaluation and Treatment Rehabilitation   SUBJECTIVE:                                                                            SUBJECTIVE STATEMENT: Doing well with the AFO, I could probably walk without the cane but it's comforting.    PERTINENT HISTORY: HISTORY OF PRESENT  ILLNESS Ms. Monique Ballard is a 81 y.o. female with history of HTN, dementia, BRCA, migraines, alcohol dependence, anxiety and recent PFO closure to treat persistent hypoxia presenting with  acute onset expressive aphasia.  She was brought to the hospital and given TNK to treat her stroke.  Her symptoms have improved.  Patient reportedly had a similar episode of expressive aphasia about a week ago that resolved spontaneously.  She had a procedure to close a PFO in June of this year.  MRI reveals no acute abnormality, so stroke is likely to small to be seen. She has been started on 2 months of Eliquis with ASA 26m and is to follow up with cardiology outpatient as well.    PAIN:  Are you having pain? No   PRECAUTIONS: None   WEIGHT BEARING RESTRICTIONS No   FALLS: Has patient fallen in last 6 months? Yes. Number of falls 2   LIVING ENVIRONMENT: Lives with: lives with their spouse, lives with their son, and 7 hours of  personal care attendant M-F Lives in: House/apartment Stairs: Yes: External: 2 steps; can reach both Has following equipment at home: Quad cane small base, Walker - 2 wheeled, WEnvironmental consultant- 4 wheeled, Shower bench, and bed side commode   PLOF: Needs assistance with ADLs and Needs assistance with gait   PATIENT GOALS Be able to walk without the walker.   OBJECTIVE:    DIAGNOSTIC FINDINGS: DISCHARGE DIAGNOSIS: left hemispheric stroke too small to be seen on MRI versus TIA possibly embolic from recent PFO closure -device thrombosis versus transient paroxysmal A-fib s/p Iv TNk with full recovery Principal Problem:   Acute ischemic stroke [place] Platypnoea orthodeoxia syndrome s/p PFO closure   COGNITION: Overall cognitive status: Within functional limits for tasks assessed             SENSATION: WFL   COORDINATION: WFL U and LE   EDEMA:  None noted   MUSCLE TONE: RLE: Within functional limits     MUSCLE LENGTH: Hamstrings: Right 72 deg; Left 81 deg   POSTURE: rounded shoulders and forward head   LOWER EXTREMITY ROM:    WFL BLE, including B ankles     LOWER EXTREMITY MMT:     MMT Right Eval Left Eval  Hip flexion 4 4  Hip extension 4 4  Hip abduction 4 4  Hip adduction      Hip internal rotation      Hip external rotation      Knee flexion 4 4  Knee extension 4 4  Ankle dorsiflexion 4 2  Ankle plantarflexion   2+  Ankle inversion      Ankle eversion      (Blank rows = not tested)   BED MOBILITY:  I   TRANSFERS: Assistive device utilized: WEnvironmental consultant- 2 wheeled  Sit to stand: SBA Stand to sit: SBA Chair to chair: SBA   CURB:  Level of Assistance: Min A Assistive device utilized: WEnvironmental consultant- 2 wheeled   STAIRS:           Level of Assistance: CGA           Stair Negotiation Technique: Step to Pattern with Bilateral Rails           Number of Stairs: 2              GAIT: Gait pattern: step through pattern, decreased ankle dorsiflexion- Left, Left steppage, Left  foot flat, and poor foot clearance- Left Distance walked: 80 with  walker, 73f with SPC Assistive device utilized: Walker - 2 wheeled Level of assistance: CGA Comments: Pt had 1 near LOB with SPC but was able to recover independently. PT provided CGA for safety. Pt has improved coordination with cane placement    FUNCTIONAL TESTs:  5 times sit to stand: 9 sec (10/31/21) Timed up and go (TUG): 19.57 Functional gait assessment: TBD   PATIENT SURVEYS:  FOTO 78 (10/31/21)   TODAY'S TREATMENT:  12/05/21 NuStep L5 x623ms  Walking without AD indoors 2 big laps  Walking with SPC outdoors navigating curbs and on grass Alt box taps 6" x10 w/SPC and then x10 w/o Walking on balance beam tandem and side steps  Marching on airex in // bars Tandem standing on airex 5s on each side  HS curls 20lb 2x10 Leg Ext 5lb 2x10 Leg press 20# 2x10  11/28/21 NuStep L5 x 6 min Gait outside L side island and front island with and without cane. Some steppage gait w/ LLE as she fatigue with outdoor ambulation. Pt Required HHA x1 with cane to negotiate curb S2S on airex 2x10 Alt 4in box taps CGA HS curls 20lb 2x10 Leg Ext 5lb 2x10 Standing ball toss  11/22/21 NuStep L5 x6m65m SPC walking 3 big laps  Leg ext 10# 3x10 HS curls 20# 3x10  Resisted side steps 10#  x4 each way- CGA Step ups from airex holding SPC x10 on each side  S2S on airex 2x10 Catch while standing on airex    11/20/21 Bike L3 x6mi75m SPC gait 3 big laps  side steps on airex with cane- CGA calf and toe raises on black bar 2x10  Lateral steps w/band in front of mat x10 Walking on beam  Standing on airex feet together, half tandem 30s each Tandem on airex 8s on L and 10s on R     11/08/21 NuStep L5 x6min68mTUG-13.57s Step ups on airex with cane  Resisted gait forwards 10#  Walking with cane 2 laps in gym  STS holding yellow ball 2x10  2 way hip 2# 2x10  Calf raises 2# 2x10  11/06/21 NuStep L5 x6mins3mtep ups 6" x10  each leg S2S on airex 2x10  Walking with quad cane Leg ext 10# 2x10 HS curls 20# 2x10  10/31/21 Tandem walking on airex pad in // bars x4 reps CGA Tandem stance, firm surface, 2x30 sec CGA Feet together airex 2x30 sec EO, x30 sec EC CGA Weight shifting on trampoline intermittent UE assist, CGA x30 sec each Sit to stand holding blue weighted ball 3x5 reps  Sidestepping with yellow TB around feet in // bars down/back x4 Nustep L5 x5 min Ambulation SPC x80ft C24fPT cuing for proper SPC placement initially   10/18/21 NuStep L5 x6mins W63ming w/o AD 3 laps, 1 instance of foot catching and minA to prevent tripping and falling  Side steps w/o AD 2x30ft [id number]  Fitter 2x10 each leg  Standing on airex cone taps with UUE support Feet together on airex 30s  Half tandem on airex 30s Step ups on airex    10/16/21 NuStep L5 x 6 minutes Standing lateral weight shifts on airex beam with BUE support on parallel bars, then L foot only B side step x4 on [place] beam SLS in parallel bars, approx 5 sec on R, 2 sec on L SLS mini squats to heel raise in parallel bars, 4 on L, 5 on R. Standing on Airex pad, alternately tapping each foot on 4" step,  with UUE support. Occasional mild LOB. Standing hip abd, ext, flexion, 2 x 10 reps each, with 2# weights at ankles, BUE support Ambulation while holding 3# WATE bar in BUE, 2 x 75', CGA, excessive hip flexion to clear L toes, but stable.     10/11/21 Nustep L4 x52mns Walking with cane 1 big loop  Walking w/o AD Step up on airex 2HHA  Standing on airex holding 2# WaTE 30s Step up 6" from airex 2HHA at stairs  Taps on 6" standing on airex only 1 HHA Placing cones and picking them up on 6" step then did standing on airex minA  LAQ 3# 2x10 Calf raises 2x10 w/2HHA            PATIENT EDUCATION: Education details: exercise technique Person educated: Patient  Education method: Explanation Education comprehension: verbalized understanding      HOME EXERCISE PROGRAM:  [id number]   GOALS: Goals reviewed with patient? Yes   SHORT TERM GOALS: Target date: 10/15/2021   I with basic HEP Baseline: Goal status: MET   2.  Determine whether to pursue AFO for L foot drop Baseline: will receive on 11/3 Goal status: MET   LONG TERM GOALS: Target date: 12/27/2021   I with final HEP Baseline:  Goal status: INITIAL   2.  Patient will perform all functional transfers MI Baseline: CGA Goal status: INITIAL   3.  Patient will perform 5 x STS in < 12 sec to demonstrate improved functional strength and control Baseline: 9 sec Goal status: MET   4.  Perform TUG in < 12 sec to demonstrate improved balance/decreased fall risk. Baseline: 19.57, 13.57s- 10/19, 11.81s-11/2 Goal status: MET   5.  Patient will ambulate x at least 500' functionally on indoor surfaces with LRAD, any necessary bracing for L ankle, MI Baseline: CGA, RW, foot drop, unsteady. Gets AFO on 11/3 Goal status: progressing   6. Increase FOTO to at least 68            Baseline 78            Goal Status MET     ASSESSMENT:   CLINICAL IMPRESSION: Pt enters doing well with her AFO donned. Progressed to some outdoor ambulation, still some hesitation with curbs and sequencing. Is able to ambulate with steady gait pattern indoors without cane. Able to do heel taps on 6" without AD today, able to do without LOB. Most difficulty with marching on airex, unable to do without holding on due to unsteadiness.     OBJECTIVE IMPAIRMENTS Abnormal gait, decreased activity tolerance, decreased balance, decreased mobility, difficulty walking, decreased strength, improper body mechanics, and postural dysfunction. Gait training with quad cane, patient is unstable and requires modA to help regain balance and prevent fall. Will benefit from more gait and strength training.    ACTIVITY LIMITATIONS carrying, lifting, bending, standing, squatting, stairs, transfers, bathing, toileting, and  locomotion level   PARTICIPATION LIMITATIONS: meal prep, cleaning, driving, and shopping   PERSONAL FACTORS Age and Behavior pattern are also affecting patient's functional outcome.    REHAB POTENTIAL: Good   CLINICAL DECISION MAKING: Stable/uncomplicated   EVALUATION COMPLEXITY: Moderate   PLAN: PT FREQUENCY: 2x/week   PT DURATION: 10 weeks   PLANNED INTERVENTIONS: Therapeutic exercises, Therapeutic activity, Neuromuscular re-education, Balance training, Gait training, Patient/Family education, Self Care, Joint mobilization, Stair training, and Manual therapy   PLAN FOR NEXT SESSION: balance and gait SPC gait, side steps on airex with cane, calf and toe raises  11:35 AM,12/05/21 Sherol Dade PT, [place]

## 2021-12-05 ENCOUNTER — Ambulatory Visit: Payer: Medicare Other

## 2021-12-05 DIAGNOSIS — M6281 Muscle weakness (generalized): Secondary | ICD-10-CM

## 2021-12-05 DIAGNOSIS — R262 Difficulty in walking, not elsewhere classified: Secondary | ICD-10-CM

## 2021-12-05 DIAGNOSIS — R2689 Other abnormalities of gait and mobility: Secondary | ICD-10-CM | POA: Diagnosis not present

## 2021-12-05 DIAGNOSIS — R278 Other lack of coordination: Secondary | ICD-10-CM

## 2021-12-05 DIAGNOSIS — R2681 Unsteadiness on feet: Secondary | ICD-10-CM | POA: Diagnosis not present

## 2021-12-10 NOTE — Therapy (Signed)
OUTPATIENT PHYSICAL THERAPY TREATMENT NOTE   Patient Name: Monique Ballard MRN: [id number] DOB:[birth year], 81 y.o., female Today's Date: 12/11/2021    END OF SESSION:   PT End of Session - 12/11/21 1228     Visit Number 17    Date for PT Re-Evaluation 12/27/21    PT Start Time 1230    PT Stop Time 1310    PT Time Calculation (min) 40 min    Activity Tolerance Patient tolerated treatment well;No increased pain    Behavior During Therapy WFL for tasks assessed/performed                   Past Medical History:  Diagnosis Date   Anxiety    Breast cancer ([place] [address]) fall 1997    Left Lumpectomy, XRT.  treated with Tamoxifem and Evista   Cryptogenic stroke ([place]) 08/20/2021   Depression    Endometriosis in her 20's   surgery to help clear endometriosis to obtain a pregnancy.   GERD (gastroesophageal reflux disease)    w/"stretching" remotely   HTN (hypertension)    Loop recorder Biotronik Biomonitor III 10/03/2021 10/03/2021   Migraines    Past Surgical History:  Procedure Laterality Date   BREAST LUMPECTOMY Left fall 1997   Lymph nodes X 3 were negative, ER+, Radiation treatmetn.  Took Tamoxifem X 5 yrs then Evista for 3-5 yrs.   CESAREAN SECTION     x2  first pregnancy breach and second normal   PATENT FORAMEN OVALE(PFO) CLOSURE N/A 07/12/2021   Procedure: PATENT FORAMEN OVALE(PFO) CLOSURE;  Surgeon: Adrian Prows, MD;  Location: [facility];  Service: Cardiovascular;  Laterality: N/A;   RIGHT HEART CATH N/A 07/12/2021   Procedure: RIGHT HEART CATH;  Surgeon: Adrian Prows, MD;  Location: [facility];  Service: Cardiovascular;  Laterality: N/A;   surgery for endometriosis     Patient Active Problem List   Diagnosis Date Noted   PFO s/p closure 11/04/2021   Loop recorder Biotronik Biomonitor III 10/03/2021 10/03/2021   Cryptogenic stroke ([place]) 08/20/2021   Closed fracture of lateral malleolus of right fibula 08/07/2021   Debility 07/15/2021    Platypnea-orthodeoxia syndrome    Polycythemia vera, acquired ([place])    Nocturnal hypoxia 07/26/2020   Alcohol dependency ([place]) 05/15/2018   Tremor 02/24/2015   Gait disorder 02/24/2015   PCP NOTES >>>>>>> 12/13/2014   Osteoporosis 10/04/2011   Annual physical exam 09/07/2010   GERD 08/28/2009   Anxiety and depression 10/06/2006   Essential hypertension 10/06/2006   BREAST CANCER, HX OF 10/06/2006    ONSET DATE: 08/21/2021    REFERRING DIAG: Diagnosis I63.9 (ICD-10-CM) - Acute ischemic stroke (HCC)    THERAPY DIAG:  Unsteadiness on feet   Muscle weakness (generalized)   Other lack of coordination   Difficulty in walking, not elsewhere classified   Abnormal posture   Rationale for Evaluation and Treatment Rehabilitation   SUBJECTIVE:                                                                            SUBJECTIVE STATEMENT: I am doing good, I don't think I will ever get rid of my cane.    PERTINENT HISTORY: HISTORY OF  PRESENT ILLNESS Monique Ballard is a 81 y.o. female with history of HTN, dementia, BRCA, migraines, alcohol dependence, anxiety and recent PFO closure to treat persistent hypoxia presenting with  acute onset expressive aphasia.  She was brought to the hospital and given TNK to treat her stroke.  Her symptoms have improved.  Patient reportedly had a similar episode of expressive aphasia about a week ago that resolved spontaneously.  She had a procedure to close a PFO in June of this year.  MRI reveals no acute abnormality, so stroke is likely to small to be seen. She has been started on 2 months of Eliquis with ASA 33m and is to follow up with cardiology outpatient as well.    PAIN:  Are you having pain? No   PRECAUTIONS: None   WEIGHT BEARING RESTRICTIONS No   FALLS: Has patient fallen in last 6 months? Yes. Number of falls 2   LIVING ENVIRONMENT: Lives with: lives with their spouse, lives with their son, and 7 hours of personal care attendant  M-F Lives in: House/apartment Stairs: Yes: External: 2 steps; can reach both Has following equipment at home: Quad cane small base, Walker - 2 wheeled, WEnvironmental consultant- 4 wheeled, Shower bench, and bed side commode   PLOF: Needs assistance with ADLs and Needs assistance with gait   PATIENT GOALS Be able to walk without the walker.   OBJECTIVE:    DIAGNOSTIC FINDINGS: DISCHARGE DIAGNOSIS: left hemispheric stroke too small to be seen on MRI versus TIA possibly embolic from recent PFO closure -device thrombosis versus transient paroxysmal A-fib s/p Iv TNk with full recovery Principal Problem:   Acute ischemic stroke ([place] Platypnoea orthodeoxia syndrome s/p PFO closure   COGNITION: Overall cognitive status: Within functional limits for tasks assessed             SENSATION: WFL   COORDINATION: WFL U and LE   EDEMA:  None noted   MUSCLE TONE: RLE: Within functional limits     MUSCLE LENGTH: Hamstrings: Right 72 deg; Left 81 deg   POSTURE: rounded shoulders and forward head   LOWER EXTREMITY ROM:    WFL BLE, including B ankles     LOWER EXTREMITY MMT:     MMT Right Eval Left Eval  Hip flexion 4 4  Hip extension 4 4  Hip abduction 4 4  Hip adduction      Hip internal rotation      Hip external rotation      Knee flexion 4 4  Knee extension 4 4  Ankle dorsiflexion 4 2  Ankle plantarflexion   2+  Ankle inversion      Ankle eversion      (Blank rows = not tested)   BED MOBILITY:  I   TRANSFERS: Assistive device utilized: WEnvironmental consultant- 2 wheeled  Sit to stand: SBA Stand to sit: SBA Chair to chair: SBA   CURB:  Level of Assistance: Min A Assistive device utilized: WEnvironmental consultant- 2 wheeled   STAIRS:           Level of Assistance: CGA           Stair Negotiation Technique: Step to Pattern with Bilateral Rails           Number of Stairs: 2              GAIT: Gait pattern: step through pattern, decreased ankle dorsiflexion- Left, Left steppage, Left foot flat, and poor foot  clearance- Left Distance walked: 80  with walker, 50f with SPC Assistive device utilized: Walker - 2 wheeled Level of assistance: CGA Comments: Pt had 1 near LOB with SPC but was able to recover independently. PT provided CGA for safety. Pt has improved coordination with cane placement    FUNCTIONAL TESTs:  5 times sit to stand: 9 sec (10/31/21) Timed up and go (TUG): 19.57 Functional gait assessment: TBD   PATIENT SURVEYS:  FOTO 78 (10/31/21)   TODAY'S TREATMENT:  12/11/21 NuStep L5 x686ms  Walking 3 big laps in gym w/o AD  Heel taps 6" Calf stretch 30s  Calf raises 2x10  S2S on airex 2x10  Step ups on airex with 1HHA  Side steps over obstacles Leg ext 5# 2x10 HS curls 20# 2x10   12/05/21 NuStep L5 x6m33m  Walking without AD indoors 2 big laps  Walking with SPC outdoors navigating curbs and on grass Alt box taps 6" x10 w/SPC and then x10 w/o Walking on balance beam tandem and side steps  Marching on airex in // bars Tandem standing on airex 5s on each side  HS curls 20lb 2x10 Leg Ext 5lb 2x10 Leg press 20# 2x10  11/28/21 NuStep L5 x 6 min Gait outside L side island and front island with and without cane. Some steppage gait w/ LLE as she fatigue with outdoor ambulation. Pt Required HHA x1 with cane to negotiate curb S2S on airex 2x10 Alt 4in box taps CGA HS curls 20lb 2x10 Leg Ext 5lb 2x10 Standing ball toss      PATIENT EDUCATION: Education details: exercise technique Person educated: Patient  Education method: Explanation Education comprehension: verbalized understanding     HOME EXERCISE PROGRAM:  [id number]   GOALS: Goals reviewed with patient? Yes   SHORT TERM GOALS: Target date: 10/15/2021   I with basic HEP Baseline: Goal status: MET   2.  Determine whether to pursue AFO for L foot drop Baseline: will receive on 11/3 Goal status: MET   LONG TERM GOALS: Target date: 12/27/2021   I with final HEP Baseline:  Goal status: INITIAL   2.   Patient will perform all functional transfers MI Baseline: CGA Goal status: INITIAL   3.  Patient will perform 5 x STS in < 12 sec to demonstrate improved functional strength and control Baseline: 9 sec Goal status: MET   4.  Perform TUG in < 12 sec to demonstrate improved balance/decreased fall risk. Baseline: 19.57, 13.57s- 10/19, 11.81s-11/2 Goal status: MET   5.  Patient will ambulate x at least 500' functionally on indoor surfaces with LRAD, any necessary bracing for L ankle, MI Baseline: CGA, RW, foot drop, unsteady. Gets AFO on 11/3 Goal status: progressing   6. Increase FOTO to at least 68            Baseline 78            Goal Status MET     ASSESSMENT:   CLINICAL IMPRESSION: Pt enters doing well with her AFO donned. Able to ambulate 3 big laps in gym today without cane, only 1 LOB but able to correct. Difficulty with step ups on airex, unable to do with holding on the therapist. Has a hard time with side steps over obstacles, states she "doesn't like this" but with repetition she gets better and more stable. Most hesitancy noted with all single leg activities.     OBJECTIVE IMPAIRMENTS Abnormal gait, decreased activity tolerance, decreased balance, decreased mobility, difficulty walking, decreased strength, improper body mechanics, and postural dysfunction.  Gait training with quad cane, patient is unstable and requires modA to help regain balance and prevent fall. Will benefit from more gait and strength training.    ACTIVITY LIMITATIONS carrying, lifting, bending, standing, squatting, stairs, transfers, bathing, toileting, and locomotion level   PARTICIPATION LIMITATIONS: meal prep, cleaning, driving, and shopping   PERSONAL FACTORS Age and Behavior pattern are also affecting patient's functional outcome.    REHAB POTENTIAL: Good   CLINICAL DECISION MAKING: Stable/uncomplicated   EVALUATION COMPLEXITY: Moderate   PLAN: PT FREQUENCY: 2x/week   PT DURATION: 10  weeks   PLANNED INTERVENTIONS: Therapeutic exercises, Therapeutic activity, Neuromuscular re-education, Balance training, Gait training, Patient/Family education, Self Care, Joint mobilization, Stair training, and Manual therapy   PLAN FOR NEXT SESSION: balance and gait SPC gait, side steps on airex with cane, calf and toe raises    1:10 PM,12/11/21 Sherol Dade PT, DPT [place] at [place]

## 2021-12-11 ENCOUNTER — Ambulatory Visit: Payer: Medicare Other

## 2021-12-11 DIAGNOSIS — R2689 Other abnormalities of gait and mobility: Secondary | ICD-10-CM | POA: Diagnosis not present

## 2021-12-11 DIAGNOSIS — R2681 Unsteadiness on feet: Secondary | ICD-10-CM | POA: Diagnosis not present

## 2021-12-11 DIAGNOSIS — M6281 Muscle weakness (generalized): Secondary | ICD-10-CM | POA: Diagnosis not present

## 2021-12-11 DIAGNOSIS — R278 Other lack of coordination: Secondary | ICD-10-CM | POA: Diagnosis not present

## 2021-12-11 DIAGNOSIS — R262 Difficulty in walking, not elsewhere classified: Secondary | ICD-10-CM

## 2021-12-17 ENCOUNTER — Encounter: Payer: Self-pay | Admitting: Cardiology

## 2021-12-18 NOTE — Telephone Encounter (Signed)
From patient's daughter.

## 2021-12-19 NOTE — Therapy (Signed)
OUTPATIENT PHYSICAL THERAPY TREATMENT NOTE   Patient Name: Monique Ballard MRN: [id number] DOB:04-Oct-1940, 81 y.o., female Today's Date: 12/20/2021    END OF SESSION:   PT End of Session - 12/20/21 1226     Visit Number 18    Date for PT Re-Evaluation 12/27/21    PT Start Time 1225    PT Stop Time 1310    PT Time Calculation (min) 45 min    Activity Tolerance Patient tolerated treatment well;No increased pain    Behavior During Therapy WFL for tasks assessed/performed                   Past Medical History:  Diagnosis Date   Anxiety    Breast cancer ([place]) fall 1997    Left Lumpectomy, XRT.  treated with Tamoxifem and Evista   Cryptogenic stroke ([place]) 08/20/2021   Depression    Endometriosis in her 20's   surgery to help clear endometriosis to obtain a pregnancy.   GERD (gastroesophageal reflux disease)    w/"stretching" remotely   HTN (hypertension)    Loop recorder Biotronik Biomonitor III 10/03/2021 10/03/2021   Migraines    Past Surgical History:  Procedure Laterality Date   BREAST LUMPECTOMY Left fall 1997   Lymph nodes X 3 were negative, ER+, Radiation treatmetn.  Took Tamoxifem X 5 yrs then Evista for 3-5 yrs.   CESAREAN SECTION     x2  first pregnancy breach and second normal   PATENT FORAMEN OVALE(PFO) CLOSURE N/A 07/12/2021   Procedure: PATENT FORAMEN OVALE(PFO) CLOSURE;  Surgeon: Adrian Prows, MD;  Location: [facility];  Service: Cardiovascular;  Laterality: N/A;   RIGHT HEART CATH N/A 07/12/2021   Procedure: RIGHT HEART CATH;  Surgeon: Adrian Prows, MD;  Location: [facility];  Service: Cardiovascular;  Laterality: N/A;   surgery for endometriosis     Patient Active Problem List   Diagnosis Date Noted   PFO s/p closure 11/04/2021   Loop recorder Biotronik Biomonitor III 10/03/2021 10/03/2021   Cryptogenic stroke ([place]) 08/20/2021   Closed fracture of lateral malleolus of right fibula 08/07/2021   Debility 07/15/2021    Platypnea-orthodeoxia syndrome    Polycythemia vera, acquired ([place])    Nocturnal hypoxia 07/26/2020   Alcohol dependency ([place]) 05/15/2018   Tremor 02/24/2015   Gait disorder 02/24/2015   PCP NOTES >>>>>>> 12/13/2014   Osteoporosis 10/04/2011   Annual physical exam 09/07/2010   GERD 08/28/2009   Anxiety and depression 10/06/2006   Essential hypertension 10/06/2006   BREAST CANCER, HX OF 10/06/2006    ONSET DATE: 08/21/2021    REFERRING DIAG: Diagnosis I63.9 (ICD-10-CM) - Acute ischemic stroke (HCC)    THERAPY DIAG:  Unsteadiness on feet   Muscle weakness (generalized)   Other lack of coordination   Difficulty in walking, not elsewhere classified   Abnormal posture   Rationale for Evaluation and Treatment Rehabilitation   SUBJECTIVE:                                                                            SUBJECTIVE STATEMENT: I am doing good.     PERTINENT HISTORY: HISTORY OF PRESENT ILLNESS Ms. Monique Ballard is a 81 y.o.  female with history of HTN, dementia, BRCA, migraines, alcohol dependence, anxiety and recent PFO closure to treat persistent hypoxia presenting with  acute onset expressive aphasia.  She was brought to the hospital and given TNK to treat her stroke.  Her symptoms have improved.  Patient reportedly had a similar episode of expressive aphasia about a week ago that resolved spontaneously.  She had a procedure to close a PFO in June of this year.  MRI reveals no acute abnormality, so stroke is likely to small to be seen. She has been started on 2 months of Eliquis with ASA 46m and is to follow up with cardiology outpatient as well.    PAIN:  Are you having pain? No   PRECAUTIONS: None   WEIGHT BEARING RESTRICTIONS No   FALLS: Has patient fallen in last 6 months? Yes. Number of falls 2   LIVING ENVIRONMENT: Lives with: lives with their spouse, lives with their son, and 7 hours of personal care attendant M-F Lives in: House/apartment Stairs: Yes:  External: 2 steps; can reach both Has following equipment at home: Quad cane small base, Walker - 2 wheeled, WEnvironmental consultant- 4 wheeled, Shower bench, and bed side commode   PLOF: Needs assistance with ADLs and Needs assistance with gait   PATIENT GOALS Be able to walk without the walker.   OBJECTIVE:    DIAGNOSTIC FINDINGS: DISCHARGE DIAGNOSIS: left hemispheric stroke too small to be seen on MRI versus TIA possibly embolic from recent PFO closure -device thrombosis versus transient paroxysmal A-fib s/p Iv TNk with full recovery Principal Problem:   [place] ischemic [place] orth[place] syndrome s/p PFO closure   COGNITION: Overall cognitive status: Within functional limits for tasks assessed             SENSATION: WFL   COORDINATION: WFL U and LE   EDEMA:  None noted   MUSCLE TONE: RLE: Within functional limits     MUSCLE LENGTH: Hamstrings: Right 72 deg; Left 81 deg   POSTURE: rounded shoulders and forward head   LOWER EXTREMITY ROM:    WFL BLE, including B ankles     LOWER EXTREMITY MMT:     MMT Right Eval Left Eval  Hip flexion 4 4  Hip extension 4 4  Hip abduction 4 4  Hip adduction      Hip internal rotation      Hip external rotation      Knee flexion 4 4  Knee extension 4 4  Ankle dorsiflexion 4 2  Ankle plantarflexion   2+  Ankle inversion      Ankle eversion      (Blank rows = not tested)   BED MOBILITY:  I   TRANSFERS: Assistive device utilized: WEnvironmental consultant- 2 wheeled  Sit to stand: SBA Stand to sit: SBA Chair to chair: SBA   CURB:  Level of Assistance: Min A Assistive device utilized: WEnvironmental consultant- 2 wheeled   STAIRS:           Level of Assistance: CGA           Stair Negotiation Technique: Step to Pattern with Bilateral Rails           Number of Stairs: 2              GAIT: Gait pattern: step through pattern, decreased ankle dorsiflexion- Left, Left steppage, Left foot flat, and poor foot clearance- Left Distance walked: 80 with  walker, 829fwith SPC Assistive device utilized: WaEnvironmental consultant  2 wheeled Level of assistance: CGA Comments: Pt had 1 near LOB with SPC but was able to recover independently. PT provided CGA for safety. Pt has improved coordination with cane placement    FUNCTIONAL TESTs:  5 times sit to stand: 9 sec (10/31/21) Timed up and go (TUG): 19.57 Functional gait assessment: TBD   PATIENT SURVEYS:  FOTO 78 (10/31/21)   TODAY'S TREATMENT:  12/20/21 NuStep L5 x10mns  Leg ext 5# 2x10 HS curls 20# 2x10 2 way hip 2# 2x10 Leg press 20# 2x10 S2S on airex 2x10 Heels taps standing on airex 4" - cga-minA   12/11/21 NuStep L5 x635ms  Walking 3 big laps in gym w/o AD  Heel taps 6" Calf stretch 30s  Calf raises 2x10  S2S on airex 2x10  Step ups on airex with 1HHA  Side steps over obstacles Leg ext 5# 2x10 HS curls 20# 2x10   12/05/21 NuStep L5 x6m62m  Walking without AD indoors 2 big laps  Walking with SPC outdoors navigating curbs and on grass Alt box taps 6" x10 w/SPC and then x10 w/o Walking on balance beam tandem and side steps  Marching on airex in // bars Tandem standing on airex 5s on each side  HS curls 20lb 2x10 Leg Ext 5lb 2x10 Leg press 20# 2x10       PATIENT EDUCATION: Education details: exercise technique Person educated: Patient  Education method: Explanation Education comprehension: verbalized understanding     HOME EXERCISE PROGRAM:  [id number]   GOALS: Goals reviewed with patient? Yes   SHORT TERM GOALS: Target date: 10/15/2021   I with basic HEP Baseline: Goal status: MET   2.  Determine whether to pursue AFO for L foot drop Baseline: will receive on 11/3 Goal status: MET   LONG TERM GOALS: Target date: 12/27/2021   I with final HEP Baseline:  Goal status: MET   2.  Patient will perform all functional transfers MI Baseline: CGA Goal status: MET   3.  Patient will perform 5 x STS in < 12 sec to demonstrate improved functional strength and  control Baseline: 9 sec Goal status: MET   4.  Perform TUG in < 12 sec to demonstrate improved balance/decreased fall risk. Baseline: 19.57, 13.57s- 10/19, 11.81s-11/2 Goal status: MET   5.  Patient will ambulate x at least 500' functionally on indoor surfaces with LRAD, any necessary bracing for L ankle, MI Baseline: CGA, RW, foot drop, unsteady. Gets AFO on 11/3 Goal status: MET   6. Increase FOTO to at least 68            Baseline 78            Goal Status MET     ASSESSMENT:   CLINICAL IMPRESSION: Patient is doing good. She has met all of her goals and is more stable on her feet. She states she feels very comfortable with her current level of function. Was advised to call if she had any questions. Reviewed HEP and added some new ones to help with progression.     OBJECTIVE IMPAIRMENTS Abnormal gait, decreased activity tolerance, decreased balance, decreased mobility, difficulty walking, decreased strength, improper body mechanics, and postural dysfunction. Gait training with quad cane, patient is unstable and requires modA to help regain balance and prevent fall. Will benefit from more gait and strength training.    ACTIVITY LIMITATIONS carrying, lifting, bending, standing, squatting, stairs, transfers, bathing, toileting, and locomotion level   PARTICIPATION LIMITATIONS: meal prep, cleaning, driving, and shopping  PERSONAL FACTORS Age and Behavior pattern are also affecting patient's functional outcome.    REHAB POTENTIAL: Good   CLINICAL DECISION MAKING: Stable/uncomplicated   EVALUATION COMPLEXITY: Moderate   PLAN: PT FREQUENCY: 2x/week   PT DURATION: 10 weeks   PLANNED INTERVENTIONS: Therapeutic exercises, Therapeutic activity, Neuromuscular re-education, Balance training, Gait training, Patient/Family education, Self Care, Joint mobilization, Stair training, and Manual therapy   PLAN FOR NEXT SESSION: balance and gait SPC gait, side steps on airex with cane, calf  and toe raises    1:05 PM,12/20/21 Sherol Dade PT, DPT [place] [facility] [place]

## 2021-12-20 ENCOUNTER — Ambulatory Visit: Payer: Medicare Other

## 2021-12-20 DIAGNOSIS — R278 Other lack of coordination: Secondary | ICD-10-CM | POA: Diagnosis not present

## 2021-12-20 DIAGNOSIS — R262 Difficulty in walking, not elsewhere classified: Secondary | ICD-10-CM

## 2021-12-20 DIAGNOSIS — R2681 Unsteadiness on feet: Secondary | ICD-10-CM

## 2021-12-20 DIAGNOSIS — M6281 Muscle weakness (generalized): Secondary | ICD-10-CM | POA: Diagnosis not present

## 2021-12-20 DIAGNOSIS — R2689 Other abnormalities of gait and mobility: Secondary | ICD-10-CM

## 2021-12-21 DIAGNOSIS — K229 Disease of esophagus, unspecified: Secondary | ICD-10-CM | POA: Diagnosis not present

## 2021-12-21 DIAGNOSIS — Z1211 Encounter for screening for malignant neoplasm of colon: Secondary | ICD-10-CM | POA: Diagnosis not present

## 2022-01-03 DIAGNOSIS — Z4509 Encounter for adjustment and management of other cardiac device: Secondary | ICD-10-CM | POA: Diagnosis not present

## 2022-01-03 DIAGNOSIS — Z95818 Presence of other cardiac implants and grafts: Secondary | ICD-10-CM | POA: Diagnosis not present

## 2022-01-03 DIAGNOSIS — I639 Cerebral infarction, unspecified: Secondary | ICD-10-CM | POA: Diagnosis not present

## 2022-01-30 ENCOUNTER — Telehealth: Payer: Self-pay

## 2022-01-30 NOTE — Telephone Encounter (Signed)
34 days overdue remote transmissions:  Called patient, NA, LMAM.

## 2022-02-01 ENCOUNTER — Encounter: Payer: Self-pay | Admitting: Internal Medicine

## 2022-02-02 ENCOUNTER — Encounter: Payer: Self-pay | Admitting: Cardiology

## 2022-02-06 ENCOUNTER — Telehealth: Payer: Self-pay

## 2022-02-06 NOTE — Telephone Encounter (Signed)
Overdue Remote Transmissions : 41 days  Called patient, NA, LMAM

## 2022-02-07 ENCOUNTER — Encounter: Payer: Self-pay | Admitting: Cardiology

## 2022-02-07 ENCOUNTER — Other Ambulatory Visit: Payer: Self-pay

## 2022-02-07 DIAGNOSIS — Z4509 Encounter for adjustment and management of other cardiac device: Secondary | ICD-10-CM | POA: Diagnosis not present

## 2022-02-07 DIAGNOSIS — Z95818 Presence of other cardiac implants and grafts: Secondary | ICD-10-CM | POA: Diagnosis not present

## 2022-02-07 DIAGNOSIS — I639 Cerebral infarction, unspecified: Secondary | ICD-10-CM | POA: Diagnosis not present

## 2022-02-07 MED ORDER — OLANZAPINE 7.5 MG PO TABS: 7.5000 mg | ORAL_TABLET | Freq: Every evening | ORAL | 0 refills | Status: DC | PRN

## 2022-02-07 MED ORDER — APIXABAN 5 MG PO TABS: 5.0000 mg | ORAL_TABLET | Freq: Two times a day (BID) | ORAL | 1 refills | Status: DC

## 2022-02-07 NOTE — Telephone Encounter (Signed)
Spoke with patient's daughter and she is aware that transmission went through.

## 2022-02-26 ENCOUNTER — Encounter: Payer: Self-pay | Admitting: Internal Medicine

## 2022-02-26 MED ORDER — ROSUVASTATIN CALCIUM 20 MG PO TABS: 20.0000 mg | ORAL_TABLET | Freq: Every day | ORAL | 1 refills | Status: DC

## 2022-03-05 ENCOUNTER — Encounter: Payer: Self-pay | Admitting: Internal Medicine

## 2022-03-05 ENCOUNTER — Ambulatory Visit (INDEPENDENT_AMBULATORY_CARE_PROVIDER_SITE_OTHER): Payer: Medicare Other | Admitting: Internal Medicine

## 2022-03-05 VITALS — BP 122/68 | HR 70 | Temp 98.0°F | Resp 16 | Ht 63.0 in | Wt 133.0 lb

## 2022-03-05 DIAGNOSIS — I10 Essential (primary) hypertension: Secondary | ICD-10-CM | POA: Diagnosis not present

## 2022-03-05 DIAGNOSIS — Z23 Encounter for immunization: Secondary | ICD-10-CM

## 2022-03-05 DIAGNOSIS — F419 Anxiety disorder, unspecified: Secondary | ICD-10-CM

## 2022-03-05 DIAGNOSIS — F32A Depression, unspecified: Secondary | ICD-10-CM | POA: Diagnosis not present

## 2022-03-05 NOTE — Progress Notes (Unsigned)
Subjective:    Patient ID: Monique Ballard, female    DOB: Jul 24, 1940, 82 y.o.   MRN: [id number]  DOS:  03/05/2022 Type of visit - description: f/u  Here for follow-up. In general feeling well. Good med compliance. Reports no gross hematuria or blood in the stools. No diplopia, slurred speech or motor deficits. No chest pain  Review of Systems See above   Past Medical History:  Diagnosis Date   Anxiety    Breast cancer ([place]) fall 1997    Left Lumpectomy, XRT.  treated with Tamoxifem and Evista   Cryptogenic stroke ([place]) 08/20/2021   Depression    Endometriosis in her 20's   surgery to help clear endometriosis to obtain a pregnancy.   GERD (gastroesophageal reflux disease)    w/"stretching" remotely   HTN (hypertension)    Loop recorder Biotronik Biomonitor III 10/03/2021 10/03/2021   Migraines     Past Surgical History:  Procedure Laterality Date   BREAST LUMPECTOMY Left fall 1997   Lymph nodes X 3 were negative, ER+, Radiation treatmetn.  Took Tamoxifem X 5 yrs then Evista for 3-5 yrs.   CESAREAN SECTION     x2  first pregnancy breach and second normal   PATENT FORAMEN OVALE(PFO) CLOSURE N/A 07/12/2021   Procedure: PATENT FORAMEN OVALE(PFO) CLOSURE;  Surgeon: Adrian Prows, MD;  Location: [facility];  Service: Cardiovascular;  Laterality: N/A;   RIGHT HEART CATH N/A 07/12/2021   Procedure: RIGHT HEART CATH;  Surgeon: Adrian Prows, MD;  Location: [facility];  Service: Cardiovascular;  Laterality: N/A;   surgery for endometriosis      Current Outpatient Medications  Medication Instructions   apixaban (ELIQUIS) 5 mg, Oral, 2 times daily   aspirin EC 81 mg, Oral, Daily, Swallow whole.   escitalopram (LEXAPRO) 20 mg, Oral, Daily   folic acid (FOLVITE) 1 mg, Oral, Daily   metoprolol tartrate (LOPRESSOR) 12.5 mg, Oral, 2 times daily   Multiple Vitamin (MULTIVITAMIN WITH MINERALS) TABS tablet 1 tablet, Oral, Daily   OLANZapine (ZYPREXA) 7.5 mg, Oral, At bedtime  PRN   pantoprazole (PROTONIX) 40 mg, Oral, Daily   rosuvastatin (CRESTOR) 20 mg, Oral, Daily   thiamine (VITAMIN B1) 100 mg, Oral, Daily       Objective:   Physical Exam BP 122/68   Pulse 70   Temp 98 F (36.7 C) (Oral)   Resp 16   Ht 5' 3"$  (1.6 m)   Wt 133 lb (60.3 kg)   LMP 01/22/1992 (Within Months)   SpO2 97%   BMI 23.56 kg/m  General:   Well developed, NAD, BMI noted. HEENT:  Normocephalic . Face symmetric, atraumatic Lungs:  CTA B Normal respiratory effort, no intercostal retractions, no accessory muscle use. Heart: RRR,  no murmur.  Lower extremities: no pretibial edema bilaterally  Skin: Not pale. Not jaundice Neurologic:  alert & oriented X3.  Speech normal, gait appropriate, assisted by quad cane.  Needs some help transferring. Psych--  Cognition and judgment appear intact.  Cooperative with normal attention span and concentration.  Behavior appropriate. No anxious or depressed appearing.      Assessment     Assessment HTN Depression, Anxiety - Dr Clovis Pu  Tremor dx [id number] Gait d/o: unsteady onset ~ 2017 GERD with esophageal stricture L Breast cancer, 1997, lumpectomy, XRT, released from oncology ETOH Stroke per MRI brain (2021), rx ASA per neuro Fatty liver per [place]  03/07/2020 Hypoxemia: Hypoxemia: Found on admission 09/30/2019.  Resolved after PFO  closure H/o PFO closure Cryptogenic stroke 07/2021 "too small to be seen on MRI versus TIA, possibly embolic due to previous/recent PFO closure or paroxysmal A-fib"   PLAN: HTN: Seems to be controlled.  Continue metoprolol.  Checking a BMP. Depression anxiety: Doing well on Lexapro and Zyprexa. High cholesterol: History of cryptogenic stroke, last LDL 100 (August 2023), now on Crestor.  Check FLP Anticoagulated due to cryptogenic stroke, no apparent complications, check CBC. EtOH: No drinking, praise! L foot drop: Using a orthosis.  Does help. Preventive care: PNM 20 today. Social: Lives with husband,  he does do driving, RTC 4 months   ============ Left foot drop: The patient has a history of L foot drop. She remains ambulatory but needs a cane or a walker. Due to the foot drop, her risk of falls is significantly increased and impairs her balance. She did try physical therapy with some help. There are no issues that could difficult her use of orthosis.

## 2022-03-05 NOTE — Patient Instructions (Addendum)
Vaccines I recommend:  Shingrix (shingles) Covid booster    GO TO THE LAB : Get the blood work     [place], [place] Come back for checkup in 4 months

## 2022-03-06 LAB — CBC WITH DIFFERENTIAL/PLATELET
Basophils Absolute: 0.1 10*3/uL (ref 0.0–0.1)
Basophils Relative: 1.2 % (ref 0.0–3.0)
Eosinophils Absolute: 0.1 10*3/uL (ref 0.0–0.7)
Eosinophils Relative: 1.3 % (ref 0.0–5.0)
HCT: 41.2 % (ref 36.0–46.0)
Hemoglobin: 13.7 g/dL (ref 12.0–15.0)
Lymphocytes Relative: 25.3 % (ref 12.0–46.0)
Lymphs Abs: 2.1 10*3/uL (ref 0.7–4.0)
MCHC: 33.3 g/dL (ref 30.0–36.0)
MCV: 88.1 fl (ref 78.0–100.0)
Monocytes Absolute: 1.9 10*3/uL — ABNORMAL HIGH (ref 0.1–1.0)
Monocytes Relative: 22.9 % — ABNORMAL HIGH (ref 3.0–12.0)
Neutro Abs: 4 10*3/uL (ref 1.4–7.7)
Neutrophils Relative %: 49.3 % (ref 43.0–77.0)
Platelets: 316 10*3/uL (ref 150.0–400.0)
RBC: 4.68 Mil/uL (ref 3.87–5.11)
RDW: 14.1 % (ref 11.5–15.5)
WBC: 8.2 10*3/uL (ref 4.0–10.5)

## 2022-03-06 LAB — BASIC METABOLIC PANEL
BUN: 14 mg/dL (ref 6–23)
CO2: 26 mEq/L (ref 19–32)
Calcium: 9.2 mg/dL (ref 8.4–10.5)
Chloride: 105 mEq/L (ref 96–112)
Creatinine, Ser: 0.86 mg/dL (ref 0.40–1.20)
GFR: 64.37 mL/min (ref >60.00)
Glucose, Bld: 84 mg/dL (ref 70–99)
Potassium: 4.6 mEq/L (ref 3.5–5.1)
Sodium: 139 mEq/L (ref 135–145)

## 2022-03-06 LAB — LIPID PANEL
Cholesterol: 117 mg/dL (ref 0–200)
HDL: 46 mg/dL (ref >39.00)
LDL Cholesterol: 45 mg/dL (ref 0–99)
NonHDL: 71.09
Total CHOL/HDL Ratio: 3
Triglycerides: 131 mg/dL (ref 0.0–149.0)
VLDL: 26.2 mg/dL (ref 0.0–40.0)

## 2022-03-06 NOTE — Assessment & Plan Note (Signed)
HTN: Seems to be controlled.  Continue metoprolol.  Checking a BMP. Depression anxiety: Doing well on Lexapro and Zyprexa. High cholesterol: History of cryptogenic stroke, last LDL 100 (August 2023), now on Crestor.  Check FLP Anticoagulated due to cryptogenic stroke, no apparent complications, check CBC. EtOH: No drinking, praise! L foot drop: Using a orthosis.  Does help. Preventive care: PNM 20 today. Social: Lives with husband, he does do driving, RTC 4 months

## 2022-03-10 DIAGNOSIS — I639 Cerebral infarction, unspecified: Secondary | ICD-10-CM | POA: Diagnosis not present

## 2022-03-10 DIAGNOSIS — Z4509 Encounter for adjustment and management of other cardiac device: Secondary | ICD-10-CM | POA: Diagnosis not present

## 2022-03-10 DIAGNOSIS — Z95818 Presence of other cardiac implants and grafts: Secondary | ICD-10-CM | POA: Diagnosis not present

## 2022-03-30 ENCOUNTER — Other Ambulatory Visit: Payer: Self-pay | Admitting: Internal Medicine

## 2022-03-30 DIAGNOSIS — F331 Major depressive disorder, recurrent, moderate: Secondary | ICD-10-CM

## 2022-03-30 DIAGNOSIS — F4001 Agoraphobia with panic disorder: Secondary | ICD-10-CM

## 2022-03-30 DIAGNOSIS — F411 Generalized anxiety disorder: Secondary | ICD-10-CM

## 2022-04-10 DIAGNOSIS — I639 Cerebral infarction, unspecified: Secondary | ICD-10-CM | POA: Diagnosis not present

## 2022-04-10 DIAGNOSIS — Z95818 Presence of other cardiac implants and grafts: Secondary | ICD-10-CM | POA: Diagnosis not present

## 2022-04-10 DIAGNOSIS — Z4509 Encounter for adjustment and management of other cardiac device: Secondary | ICD-10-CM | POA: Diagnosis not present

## 2022-05-03 ENCOUNTER — Telehealth: Payer: Self-pay | Admitting: Internal Medicine

## 2022-05-03 NOTE — Telephone Encounter (Signed)
Copied from CRM [id number]. Topic: Medicare AWV >> May 03, 2022  1:38 PM Payton Doughty wrote: Reason for CRM: Called patient to reschedule Medicare Annual Wellness Visit (AWV). Left message for patient to call back and confirm appt date change Medicare Annual Wellness Visit (AWV).  Last date of AWV: nonoe  Please schedule an appointment at any time with Donne Anon, CMA  .  If any questions, please contact me.  Thank you ,  Verlee Rossetti; Care Guide Ambulatory Clinical Support [place] [facility] Direct Dial: [phone]

## 2022-05-03 NOTE — Telephone Encounter (Signed)
Contacted Deanna Hritz to schedule their annual wellness visit. Appointment made for 05/15/2022.  Verlee Rossetti; Care Guide Ambulatory Clinical Support [place] [facility] Direct Dial: [phone]

## 2022-05-11 DIAGNOSIS — Z4509 Encounter for adjustment and management of other cardiac device: Secondary | ICD-10-CM | POA: Diagnosis not present

## 2022-05-11 DIAGNOSIS — Z95818 Presence of other cardiac implants and grafts: Secondary | ICD-10-CM | POA: Diagnosis not present

## 2022-05-11 DIAGNOSIS — I639 Cerebral infarction, unspecified: Secondary | ICD-10-CM | POA: Diagnosis not present

## 2022-05-21 ENCOUNTER — Other Ambulatory Visit: Payer: Self-pay | Admitting: Internal Medicine

## 2022-06-11 DIAGNOSIS — Z95818 Presence of other cardiac implants and grafts: Secondary | ICD-10-CM | POA: Diagnosis not present

## 2022-06-11 DIAGNOSIS — I639 Cerebral infarction, unspecified: Secondary | ICD-10-CM | POA: Diagnosis not present

## 2022-06-11 DIAGNOSIS — Z4509 Encounter for adjustment and management of other cardiac device: Secondary | ICD-10-CM | POA: Diagnosis not present

## 2022-07-05 ENCOUNTER — Ambulatory Visit (INDEPENDENT_AMBULATORY_CARE_PROVIDER_SITE_OTHER): Payer: Medicare Other | Admitting: Internal Medicine

## 2022-07-05 ENCOUNTER — Encounter: Payer: Self-pay | Admitting: Internal Medicine

## 2022-07-05 ENCOUNTER — Other Ambulatory Visit (HOSPITAL_BASED_OUTPATIENT_CLINIC_OR_DEPARTMENT_OTHER): Payer: Self-pay

## 2022-07-05 VITALS — BP 128/80 | HR 93 | Temp 98.3°F | Resp 16 | Ht 63.0 in | Wt 141.1 lb

## 2022-07-05 DIAGNOSIS — F32A Depression, unspecified: Secondary | ICD-10-CM | POA: Diagnosis not present

## 2022-07-05 DIAGNOSIS — M810 Age-related osteoporosis without current pathological fracture: Secondary | ICD-10-CM

## 2022-07-05 DIAGNOSIS — F419 Anxiety disorder, unspecified: Secondary | ICD-10-CM

## 2022-07-05 DIAGNOSIS — I10 Essential (primary) hypertension: Secondary | ICD-10-CM

## 2022-07-05 DIAGNOSIS — I639 Cerebral infarction, unspecified: Secondary | ICD-10-CM | POA: Diagnosis not present

## 2022-07-05 DIAGNOSIS — Z23 Encounter for immunization: Secondary | ICD-10-CM | POA: Diagnosis not present

## 2022-07-05 MED ORDER — ALENDRONATE SODIUM 70 MG PO TABS
70.0000 mg | ORAL_TABLET | ORAL | 3 refills | Status: DC
  Filled 2022-07-05: qty 4, 28d supply, fill #0

## 2022-07-05 MED ORDER — ALENDRONATE SODIUM 70 MG PO TABS: 70.0000 mg | ORAL_TABLET | ORAL | 3 refills | Status: DC

## 2022-07-05 MED ORDER — COMIRNATY 30 MCG/0.3ML IM SUSY
PREFILLED_SYRINGE | INTRAMUSCULAR | 0 refills | Status: DC
  Filled 2022-07-05: qty 0.3, 1d supply, fill #0

## 2022-07-05 NOTE — Progress Notes (Unsigned)
Subjective:    Patient ID: Monique Ballard, female    DOB: [birth year], 82 y.o.   MRN: [id number]  DOS:  07/05/2022 Type of visit - description: f/u Since the last office visit is feeling well and has no major concerns. Good compliance with medication including Lexapro, emotionally doing well. Denies chest pain or difficulty breathing No GI or GU symptoms.    Review of Systems See above   Past Medical History:  Diagnosis Date   Anxiety    Breast cancer ([facility]) fall 1997    Left Lumpectomy, XRT.  treated with Tamoxifem and Evista   Cryptogenic stroke ([facility]) 08/20/2021   Depression    Endometriosis in her 20's   surgery to help clear endometriosis to obtain a pregnancy.   GERD (gastroesophageal reflux disease)    w/"stretching" remotely   HTN (hypertension)    Loop recorder Biotronik Biomonitor III 10/03/2021 10/03/2021   Migraines     Past Surgical History:  Procedure Laterality Date   BREAST LUMPECTOMY Left fall 1997   Lymph nodes X 3 were negative, ER+, Radiation treatmetn.  Took Tamoxifem X 5 yrs then Evista for 3-5 yrs.   CESAREAN SECTION     x2  first pregnancy breach and second normal   PATENT FORAMEN OVALE(PFO) CLOSURE N/A 07/12/2021   Procedure: PATENT FORAMEN OVALE(PFO) CLOSURE;  Surgeon: Yates Decamp, MD;  Location: [facility];  Service: Cardiovascular;  Laterality: N/A;   RIGHT HEART CATH N/A 07/12/2021   Procedure: RIGHT HEART CATH;  Surgeon: Yates Decamp, MD;  [facility];  Service: Cardiovascular;  Laterality: N/A;   surgery for endometriosis      Current Outpatient Medications  Medication Instructions   apixaban (ELIQUIS) 5 mg, Oral, 2 times daily   aspirin EC 81 mg, Oral, Daily, Swallow whole.   escitalopram (LEXAPRO) 20 mg, Oral, Daily   folic acid (FOLVITE) 1 mg, Oral, Daily   metoprolol tartrate (LOPRESSOR) 12.5 mg, Oral, 2 times daily   Multiple Vitamin (MULTIVITAMIN WITH MINERALS) TABS tablet 1 tablet, Oral, Daily   OLANZapine  (ZYPREXA) 7.5 MG tablet TAKE ONE TABELT BY MOUTH AS BEDTIME AS NEEDED   pantoprazole (PROTONIX) 40 mg, Oral, Daily   rosuvastatin (CRESTOR) 20 mg, Oral, Daily   thiamine (VITAMIN B1) 100 mg, Oral, Daily       Objective:   Physical Exam BP 128/80   Pulse 93   Temp 98.3 F (36.8 C) (Oral)   Resp 16   Ht 5\' 3"  (1.6 m)   Wt 141 lb 2 oz (64 kg)   LMP 01/22/1992 (Within Months)   SpO2 95%   BMI 25.00 kg/m  General:   Well developed, NAD, BMI noted. HEENT:  Normocephalic . Face symmetric, atraumatic Lungs:  CTA B Normal respiratory effort, no intercostal retractions, no accessory muscle use. Heart: RRR,  no murmur.  Lower extremities: no pretibial edema bilaterally  Skin: Not pale. Not jaundice Neurologic:  alert & oriented X3.  Speech normal, gait appropriate for age and unassisted Psych--  Cognition and judgment appear intact.  Cooperative with normal attention span and concentration.  Behavior appropriate. No anxious or depressed appearing.      Assessment     Assessment HTN Depression, Anxiety - Dr Jennelle Human Dyslipidemia Tremor dx 11-2014 Gait d/o: unsteady onset ~ 2017 GERD with esophageal stricture L Breast cancer, 1997, lumpectomy, XRT, released from oncology ETOH Stroke per MRI brain (2021), rx ASA per neuro Fatty liver per [place]  03/07/2020 Hypoxemia: Hypoxemia:  Found on admission 09/30/2019.  Resolved after PFO closure H/o PFO closure Cryptogenic stroke 07/2021 "too small to be seen on MRI versus TIA, possibly embolic due to previous/recent PFO closure or paroxysmal A-fib"- RX OAC   PLAN: HTN: BP seems very good, continue present care Depression: Per psych, well-controlled Dyslipidemia: Last FLP satisfactory, continue Crestor. EtOH: Not drinking anymore.praised Osteoporosis:Fosamax was stopped after she was admitted to the hospital last year, I do not see a contraindication to restart it.  Prescription sent, restart Fosamax, see AVS Labs reviewed, no need for  blood work today RTC 4 months 2- HTN: Seems to be controlled.  Continue metoprolol.  Checking a BMP. Depression anxiety: Doing well on Lexapro and Zyprexa. High cholesterol: History of cryptogenic stroke, last LDL 100 (August 2023), now on Crestor.  Check FLP Anticoagulated due to cryptogenic stroke, no apparent complications, check CBC. EtOH: No drinking, praise! L foot drop: Using a orthosis.  Does help. Preventive care: PNM 20 today. Social: Lives with husband, he does do driving, RTC 4 months

## 2022-07-05 NOTE — Patient Instructions (Addendum)
Restart alendronate (Fosamax) were sending a prescription. Take it once a week, early in the morning after you wake up, on the MTF stop back 2 glasses of water. Do not lay down or eat anything for the following 45 minutes. Please reach out if you have questions.    Vaccines I recommend: Covid booster Shingrix (shingles) RSV vaccine Flu shot this fall  Check the  blood pressure regularly BP GOAL is between 110/65 and  135/85. If it is consistently higher or lower, let me know     GO TO THE FRONT DESK, PLEASE SCHEDULE YOUR APPOINTMENTS Come back for checkup in 4 months

## 2022-07-06 NOTE — Assessment & Plan Note (Signed)
HTN: BP seems very good, continue present care Depression: Per psych, well-controlled Dyslipidemia: Last FLP satisfactory, continue Crestor. EtOH: Not drinking anymore.praised Osteoporosis:Fosamax was stopped after she was admitted to the hospital last year, I do not see a contraindication to restart it.  Prescription sent, restart Fosamax, see AVS Labs reviewed, no need for blood work today RTC 4 months

## 2022-07-12 DIAGNOSIS — Z4509 Encounter for adjustment and management of other cardiac device: Secondary | ICD-10-CM | POA: Diagnosis not present

## 2022-07-12 DIAGNOSIS — I639 Cerebral infarction, unspecified: Secondary | ICD-10-CM | POA: Diagnosis not present

## 2022-07-12 DIAGNOSIS — Z95818 Presence of other cardiac implants and grafts: Secondary | ICD-10-CM | POA: Diagnosis not present

## 2022-07-22 ENCOUNTER — Other Ambulatory Visit: Payer: No Typology Code available for payment source

## 2022-08-05 ENCOUNTER — Ambulatory Visit: Payer: No Typology Code available for payment source | Admitting: Cardiology

## 2022-08-12 DIAGNOSIS — I639 Cerebral infarction, unspecified: Secondary | ICD-10-CM | POA: Diagnosis not present

## 2022-08-12 DIAGNOSIS — Z4509 Encounter for adjustment and management of other cardiac device: Secondary | ICD-10-CM | POA: Diagnosis not present

## 2022-08-12 DIAGNOSIS — Z95818 Presence of other cardiac implants and grafts: Secondary | ICD-10-CM | POA: Diagnosis not present

## 2022-09-12 DIAGNOSIS — Z95818 Presence of other cardiac implants and grafts: Secondary | ICD-10-CM | POA: Diagnosis not present

## 2022-09-12 DIAGNOSIS — I639 Cerebral infarction, unspecified: Secondary | ICD-10-CM | POA: Diagnosis not present

## 2022-09-12 DIAGNOSIS — Z4509 Encounter for adjustment and management of other cardiac device: Secondary | ICD-10-CM | POA: Diagnosis not present

## 2022-10-13 DIAGNOSIS — I639 Cerebral infarction, unspecified: Secondary | ICD-10-CM | POA: Diagnosis not present

## 2022-10-13 DIAGNOSIS — Z95818 Presence of other cardiac implants and grafts: Secondary | ICD-10-CM | POA: Diagnosis not present

## 2022-10-13 DIAGNOSIS — Z4509 Encounter for adjustment and management of other cardiac device: Secondary | ICD-10-CM | POA: Diagnosis not present

## 2022-10-22 ENCOUNTER — Ambulatory Visit: Payer: Medicare Other

## 2022-10-22 DIAGNOSIS — I639 Cerebral infarction, unspecified: Secondary | ICD-10-CM

## 2022-10-22 LAB — CUP PACEART REMOTE DEVICE CHECK
Date Time Interrogation Session: 20241001100229
Implantable Pulse Generator Implant Date: 20230913
Pulse Gen Model: 450218
Pulse Gen Serial Number: 95049141

## 2022-11-05 ENCOUNTER — Ambulatory Visit: Payer: Medicare Other | Admitting: Internal Medicine

## 2022-11-07 NOTE — Progress Notes (Signed)
Biotronik Loop Recorder 

## 2022-11-07 NOTE — Progress Notes (Signed)
Pt did not answer phone for AWV.This encounter was created in error - please disregard.

## 2022-11-08 ENCOUNTER — Encounter: Payer: Self-pay | Admitting: Internal Medicine

## 2022-11-08 ENCOUNTER — Ambulatory Visit: Payer: Medicare Other | Admitting: Internal Medicine

## 2022-11-08 VITALS — BP 126/72 | HR 78 | Temp 97.7°F | Resp 18 | Ht 63.0 in | Wt 130.4 lb

## 2022-11-08 DIAGNOSIS — I10 Essential (primary) hypertension: Secondary | ICD-10-CM

## 2022-11-08 DIAGNOSIS — I639 Cerebral infarction, unspecified: Secondary | ICD-10-CM | POA: Diagnosis not present

## 2022-11-08 DIAGNOSIS — E785 Hyperlipidemia, unspecified: Secondary | ICD-10-CM

## 2022-11-08 DIAGNOSIS — Z23 Encounter for immunization: Secondary | ICD-10-CM

## 2022-11-08 NOTE — Patient Instructions (Addendum)
Vaccines I recommend: Covid booster Shingrix (shingles) RSV vaccine  Take vitamin D over-the-counter: 2000 units every day     GO TO THE LAB : Get the blood work     Next visit with me in 4 months for checkup Please schedule it at the front desk

## 2022-11-08 NOTE — Progress Notes (Unsigned)
Subjective:    Patient ID: Monique Ballard, female    DOB: 1940/12/12, 82 y.o.   MRN: [id number]  DOS:  11/08/2022 Type of visit - description: Follow-up  Since the last office visit is doing well and has no concerns. Good compliance with anticoagulants, denies any fall or injuries or unusual bleeding. No chest pain no difficulty breathing No EtOH.   Review of Systems See above   Past Medical History:  Diagnosis Date   Anxiety    Breast cancer (HCC) fall 1997    Left Lumpectomy, XRT.  treated with Tamoxifem and Evista   Cryptogenic stroke (HCC) 08/20/2021   Depression    Endometriosis in her 20's   surgery to help clear endometriosis to obtain a pregnancy.   GERD (gastroesophageal reflux disease)    w/"stretching" remotely   HTN (hypertension)    Loop recorder Biotronik Biomonitor III 10/03/2021 10/03/2021   Migraines     Past Surgical History:  Procedure Laterality Date   BREAST LUMPECTOMY Left fall 1997   Lymph nodes X 3 were negative, ER+, Radiation treatmetn.  Took Tamoxifem X 5 yrs then Evista for 3-5 yrs.   CESAREAN SECTION     x2  first pregnancy breach and second normal   PATENT FORAMEN OVALE(PFO) CLOSURE N/A 07/12/2021   Procedure: PATENT FORAMEN OVALE(PFO) CLOSURE;  Surgeon: Yates Decamp, MD;  Location: [facility] INVASIVE CV LAB;  Service: Cardiovascular;  Laterality: N/A;   RIGHT HEART CATH N/A 07/12/2021   Procedure: RIGHT HEART CATH;  Surgeon: Yates Decamp, MD;  Location: [facility] INVASIVE CV LAB;  Service: Cardiovascular;  Laterality: N/A;   surgery for endometriosis      Current Outpatient Medications  Medication Instructions   alendronate (FOSAMAX) 70 mg, Oral, Every 7 days, Take with a full glass of water on an empty stomach.   apixaban (ELIQUIS) 5 mg, Oral, 2 times daily   aspirin EC 81 mg, Oral, Daily, Swallow whole.   escitalopram (LEXAPRO) 20 mg, Oral, Daily   folic acid (FOLVITE) 1 mg, Oral, Daily   metoprolol tartrate (LOPRESSOR) 12.5 mg, Oral, 2 times daily    Multiple Vitamin (MULTIVITAMIN WITH MINERALS) TABS tablet 1 tablet, Oral, Daily   OLANZapine (ZYPREXA) 7.5 MG tablet TAKE ONE TABELT BY MOUTH AS BEDTIME AS NEEDED   pantoprazole (PROTONIX) 40 mg, Oral, Daily   rosuvastatin (CRESTOR) 20 mg, Oral, Daily   thiamine (VITAMIN B1) 100 mg, Oral, Daily       Objective:   Physical Exam BP 126/72   Pulse 78   Temp 97.7 F (36.5 C) (Oral)   Resp 18   Ht 5\' 3"  (1.6 m)   Wt 130 lb 6 oz (59.1 kg)   LMP 01/22/1992 (Within Months)   SpO2 95%   BMI 23.09 kg/m  General:   Well developed, NAD, Compared to previous visits, she seems somewhat disheveled slightly more frail. HEENT:  Normocephalic . Face symmetric, atraumatic Lungs:  CTA B Normal respiratory effort, no intercostal retractions, no accessory muscle use. Heart: RRR,  no murmur.  Lower extremities: no pretibial edema bilaterally  Skin: Not pale. Not jaundice Neurologic:  alert & oriented X3.  Speech normal, gait appropriate for age and unassisted Psych--  Cognition and judgment appear intact.  Cooperative with normal attention span and concentration.  Behavior appropriate. No anxious or depressed appearing.      Assessment     Assessment HTN Depression, Anxiety - Dr Jennelle Human 2021, now Rx by PCP Dyslipidemia Tremor dx 11-2014 Gait d/o: unsteady  onset ~ 2017 GERD with esophageal stricture L Breast cancer, 1997, lumpectomy, XRT, released from oncology ETOH CV:  --Stroke per MRI brain (2021), rx ASA per neuro --Cryptogenic stroke 07/2021 "too small to be seen on MRI versus TIA, possibly embolic due to previous/recent PFO closure or paroxysmal A-fib"- RX OAC Fatty liver per [place]  03/07/2020 Hypoxemia: Hypoxemia: Found on admission 09/30/2019.  Resolved after PFO closure H/o PFO closure Osteoporosis: T-score  (-2.1)  2021.  2 vertebral fractures, Rx Fosamax.~ 2023    PLAN: HTN: BP looks very good, no change, continue metoprolol, check BMP. Depression: Has not seen  psychiatry in a while, PCP refilling medications, things are well-controlled.  No change. Dyslipidemia: On Crestor, check LFTs. H/o 2 Jennica stroke: Anticoagulated, check CBC. EtOH: Not drinking at all. She looks somewhat disheveled and frail but states she is doing great.  Observation Osteoporosis: On Fosamax, recommend vitamin D.  Last DEXA 2021, consider DEXA over the next year. Preventive care: Flu shot today check, COVID booster recommended RTC 4 months  HTN: BP seems very good, continue present care Depression: Per psych, well-controlled Dyslipidemia: Last FLP satisfactory, continue Crestor. EtOH: Not drinking anymore.praised Osteoporosis:Fosamax was stopped after she was admitted to the hospital last year, I do not see a contraindication to restart it.  Prescription sent, restart Fosamax, see AVS Labs reviewed, no need for blood work today RTC 4 months

## 2022-11-09 LAB — BASIC METABOLIC PANEL
BUN: 7 mg/dL (ref 7–25)
CO2: 26 mmol/L (ref 20–32)
Calcium: 9.8 mg/dL (ref 8.6–10.4)
Chloride: 104 mmol/L (ref 98–110)
Creat: 0.77 mg/dL (ref 0.60–1.00)
Glucose, Bld: 90 mg/dL (ref 65–99)
Potassium: 4.7 mmol/L (ref 3.5–5.3)
Sodium: 138 mmol/L (ref 135–146)

## 2022-11-09 LAB — CBC WITH DIFFERENTIAL/PLATELET
Absolute Lymphocytes: 2613 {cells}/uL (ref 850–3900)
Absolute Monocytes: 905 {cells}/uL (ref 200–950)
Basophils Absolute: 47 {cells}/uL (ref 0–200)
Basophils Relative: 0.6 %
Eosinophils Absolute: 39 {cells}/uL (ref 15–500)
Eosinophils Relative: 0.5 %
HCT: 47.8 % — ABNORMAL HIGH (ref 35.0–45.0)
Hemoglobin: 15.5 g/dL (ref 11.7–15.5)
MCH: 27.4 pg (ref 27.0–33.0)
MCHC: 32.4 g/dL (ref 32.0–36.0)
MCV: 84.6 fL (ref 80.0–100.0)
MPV: 9.7 fL (ref 7.5–12.5)
Monocytes Relative: 11.6 %
Neutro Abs: 4196 {cells}/uL (ref 1500–7800)
Neutrophils Relative %: 53.8 %
Platelets: 339 10*3/uL (ref 140–400)
RBC: 5.65 10*6/uL — ABNORMAL HIGH (ref 3.80–5.10)
RDW: 12.8 % (ref 11.0–15.0)
Total Lymphocyte: 33.5 %
WBC: 7.8 10*3/uL (ref 3.8–10.8)

## 2022-11-09 LAB — ALT: ALT: 5 U/L — ABNORMAL LOW (ref 6–29)

## 2022-11-09 LAB — AST: AST: 11 U/L (ref 10–35)

## 2022-11-09 NOTE — Assessment & Plan Note (Signed)
HTN: BP looks very good, no change, continue metoprolol, check BMP. Depression: Has not seen psychiatry in a while, PCP rx meds,  well-controlled.  No change. Dyslipidemia: On Crestor, check LFTs. H/o cryptogenic stroke: Anticoagulated, check CBC. EtOH: Not drinking at all. Osteoporosis: On Fosamax, recommend vitamin D.  Last DEXA 2021, consider DEXA over the next year. Preventive care: Flu shot today check, COVID booster recommended Social: She looks somewhat disheveled and frail but states she is doing great.  Observation RTC 4 months

## 2022-11-10 ENCOUNTER — Other Ambulatory Visit: Payer: Self-pay | Admitting: Internal Medicine

## 2022-11-10 DIAGNOSIS — F4001 Agoraphobia with panic disorder: Secondary | ICD-10-CM

## 2022-11-10 DIAGNOSIS — F331 Major depressive disorder, recurrent, moderate: Secondary | ICD-10-CM

## 2022-11-10 DIAGNOSIS — F411 Generalized anxiety disorder: Secondary | ICD-10-CM

## 2022-11-25 ENCOUNTER — Ambulatory Visit (INDEPENDENT_AMBULATORY_CARE_PROVIDER_SITE_OTHER): Payer: Medicare Other

## 2022-11-25 DIAGNOSIS — I639 Cerebral infarction, unspecified: Secondary | ICD-10-CM

## 2022-11-25 LAB — CUP PACEART REMOTE DEVICE CHECK
Date Time Interrogation Session: 20241104114117
Implantable Pulse Generator Implant Date: 20230913
Pulse Gen Model: 450218
Pulse Gen Serial Number: 95049141

## 2022-12-01 ENCOUNTER — Encounter: Payer: Self-pay | Admitting: Internal Medicine

## 2022-12-05 ENCOUNTER — Telehealth: Payer: Self-pay | Admitting: Internal Medicine

## 2022-12-05 MED ORDER — ROSUVASTATIN CALCIUM 20 MG PO TABS: 20.0000 mg | ORAL_TABLET | Freq: Every day | ORAL | 1 refills | Status: DC

## 2022-12-05 MED ORDER — PANTOPRAZOLE SODIUM 40 MG PO TBEC: 40.0000 mg | DELAYED_RELEASE_TABLET | Freq: Every day | ORAL | 1 refills | Status: DC

## 2022-12-05 NOTE — Telephone Encounter (Signed)
Patient called and stated that she was reviewing her updated medication list, she is missing rosuvastatin (CRESTOR) 20 MG tablet and pantoprazole (PROTONIX) EC tablet 40 mg. She would like it sent to [facility] on [address]. Please call and advise pt.

## 2022-12-05 NOTE — Telephone Encounter (Signed)
Rxs sent

## 2022-12-05 NOTE — Addendum Note (Signed)
Addended byConrad Willey D on: 12/05/2022 08:29 AM   Modules accepted: Orders

## 2022-12-06 ENCOUNTER — Ambulatory Visit (INDEPENDENT_AMBULATORY_CARE_PROVIDER_SITE_OTHER): Payer: Medicare Other | Admitting: *Deleted

## 2022-12-06 DIAGNOSIS — Z0000 Encounter for general adult medical examination without abnormal findings: Secondary | ICD-10-CM | POA: Diagnosis not present

## 2022-12-06 DIAGNOSIS — Z780 Asymptomatic menopausal state: Secondary | ICD-10-CM

## 2022-12-06 DIAGNOSIS — Z1231 Encounter for screening mammogram for malignant neoplasm of breast: Secondary | ICD-10-CM

## 2022-12-06 NOTE — Progress Notes (Signed)
Subjective:   Monique Ballard is a 82 y.o. female who presents for Medicare Annual (Subsequent) preventive examination.  Visit Complete: Virtual I connected with  Monique Ballard on 12/06/22 by a audio enabled telemedicine application and verified that I am speaking with the correct person using two identifiers.  Patient Location: Home  Provider Location: Office/Clinic  I discussed the limitations of evaluation and management by telemedicine. The patient expressed understanding and agreed to proceed.  Vital Signs: Because this visit was a virtual/telehealth visit, some criteria may be missing or patient reported. Any vitals not documented were not able to be obtained and vitals that have been documented are patient reported.  Cardiac Risk Factors include: dyslipidemia;advanced age (>13men, >76 women);hypertension     Objective:    There were no vitals filed for this visit. There is no height or weight on file to calculate BMI.     12/06/2022    2:58 PM 08/22/2021   11:00 AM 07/15/2021    1:39 PM 07/08/2021    9:25 AM 05/01/2021   11:36 AM 04/27/2020    2:07 PM 10/30/2019    3:23 AM  Advanced Directives  Does Patient Have a Medical Advance Directive? Yes No Yes No Yes No No  Type of Estate agent of [place];Living will  Healthcare Power of [place];Living will  Healthcare Power of [place];Living will    Does patient want to make changes to medical advance directive? No - Patient declined  No - Patient declined  No - Patient declined    Copy of Healthcare Power of Attorney in Chart? Yes - validated most recent copy scanned in chart (See row information)  No - copy requested  No - copy requested    Would patient like information on creating a medical advance directive?  No - Patient declined No - Patient declined No - Patient declined  Yes (MAU/Ambulatory/Procedural Areas - Information given)     Current Medications (verified) Outpatient Encounter Medications as  of 12/06/2022  Medication Sig   alendronate (FOSAMAX) 70 MG tablet Take 1 tablet (70 mg total) by mouth every 7 (seven) days. Take with a full glass of water on an empty stomach.   apixaban (ELIQUIS) 5 MG TABS tablet Take 1 tablet (5 mg total) by mouth 2 (two) times daily.   aspirin EC 81 MG tablet Take 1 tablet (81 mg total) by mouth daily. Swallow whole.   Cholecalciferol (VITAMIN D) 50 MCG (2000 UT) CAPS Take by mouth.   escitalopram (LEXAPRO) 20 MG tablet Take 1 tablet (20 mg total) by mouth daily.   folic acid (FOLVITE) 1 MG tablet Take 1 tablet (1 mg total) by mouth daily.   metoprolol tartrate (LOPRESSOR) 25 MG tablet Take 0.5 tablets (12.5 mg total) by mouth 2 (two) times daily.   Multiple Vitamin (MULTIVITAMIN WITH MINERALS) TABS tablet Take 1 tablet by mouth daily.   OLANZapine (ZYPREXA) 7.5 MG tablet TAKE ONE TABELT BY MOUTH AS BEDTIME AS NEEDED   pantoprazole (PROTONIX) 40 MG tablet Take 1 tablet (40 mg total) by mouth daily.   rosuvastatin (CRESTOR) 20 MG tablet Take 1 tablet (20 mg total) by mouth daily.   thiamine 100 MG tablet Take 1 tablet (100 mg total) by mouth daily.   No facility-administered encounter medications on file as of 12/06/2022.    Allergies (verified) Penicillins   History: Past Medical History:  Diagnosis Date   Anxiety    Breast cancer (HCC) fall 1997    Left  Lumpectomy, XRT.  treated with Tamoxifem and Evista   Cryptogenic stroke (HCC) 08/20/2021   Depression    Endometriosis in her 20's   surgery to help clear endometriosis to obtain a pregnancy.   GERD (gastroesophageal reflux disease)    w/"stretching" remotely   HTN (hypertension)    Loop recorder Biotronik Biomonitor III 10/03/2021 10/03/2021   Migraines    Past Surgical History:  Procedure Laterality Date   BREAST LUMPECTOMY Left fall 1997   Lymph nodes X 3 were negative, ER+, Radiation treatmetn.  Took Tamoxifem X 5 yrs then Evista for 3-5 yrs.   CESAREAN SECTION     x2  first  pregnancy breach and second normal   PATENT FORAMEN OVALE(PFO) CLOSURE N/A 07/12/2021   Procedure: PATENT FORAMEN OVALE(PFO) CLOSURE;  Surgeon: Yates Decamp, MD;  Location: [facility] INVASIVE CV LAB;  Service: Cardiovascular;  Laterality: N/A;   RIGHT HEART CATH N/A 07/12/2021   Procedure: RIGHT HEART CATH;  Surgeon: Yates Decamp, MD;  Location: [facility] INVASIVE CV LAB;  Service: Cardiovascular;  Laterality: N/A;   surgery for endometriosis     Family History  Problem Relation Age of Onset   Hypertension Mother    Stroke Mother    Heart attack Father        elderly   Heart disease Father        CHF   Throat cancer Other    Breast cancer Neg Hx    Colon cancer Neg Hx    Diabetes Neg Hx    Neuropathy Neg Hx    Social History   Socioeconomic History   Marital status: Married    Spouse name: Not on file   Number of children: 2   Years of education: Not on file   Highest education level: Bachelor's degree (e.g., BA, AB, BS)  Occupational History   Occupation: retired  Tobacco Use   Smoking status: Former    Current packs/day: 0.00    Average packs/day: 0.5 packs/day for 25.0 years (12.5 ttl pk-yrs)    Types: Cigarettes    Start date: 60    Quit date: 1990    Years since quitting: 34.8   Smokeless tobacco: Never  Vaping Use   Vaping status: Never Used  Substance and Sexual Activity   Alcohol use: Not Currently   Drug use: No   Sexual activity: Never    Birth control/protection: None  Other Topics Concern   Not on file  Social History Narrative   Lives w/ husband and son   One older Brother passed away   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      Son lives with her as of 12/2016    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, she is a Adult nurse, lives in [place], has 2 children      Caffeine: 1 cup coffee daily      Social Determinants of Health   Financial Resource Strain: Low Risk  (12/06/2022)   Overall Financial Resource Strain (CARDIA)    Difficulty of Paying  Living Expenses: Not hard at all  Food Insecurity: No Food Insecurity (12/06/2022)   Hunger Vital Sign    Worried About Running Out of Food in the Last Year: Never true    Ran Out of Food in the Last Year: Never true  Transportation Needs: No Transportation Needs (12/06/2022)   PRAPARE - Administrator, Civil Service (Medical): No    Lack of Transportation (Non-Medical): No  Physical Activity: Inactive (12/06/2022)  Exercise Vital Sign    Days of Exercise per Week: 0 days    Minutes of Exercise per Session: 0 min  Stress: No Stress Concern Present (12/06/2022)   [employer] of Occupational Health - Occupational Stress Questionnaire    Feeling of Stress : Only a little  Social Connections: Moderately Isolated (12/06/2022)   Social Connection and Isolation Panel [NHANES]    Frequency of Communication with Friends and Family: Three times a week    Frequency of Social Gatherings with Friends and Family: Never    Attends Religious Services: Never    Database administrator or Organizations: No    Attends Engineer, structural: Never    Marital Status: Married    Tobacco Counseling Counseling given: Not Answered   Clinical Intake:  Pre-visit preparation completed: Yes  Pain : No/denies pain  Nutritional Risks: None Diabetes: No  How often do you need to have someone help you when you read instructions, pamphlets, or other written materials from your doctor or pharmacy?: 1 - Never  Interpreter Needed?: No  Information entered by :: [employer], CMA   Activities of Daily Living    12/06/2022    3:03 PM  In your present state of health, do you have any difficulty performing the following activities:  Hearing? 0  Vision? 0  Difficulty concentrating or making decisions? 0  Walking or climbing stairs? 1  Dressing or bathing? 0  Doing errands, shopping? 1  Comment husband Insurance claims handler and eating ? N  Using the Toilet? N  In the past  six months, have you accidently leaked urine? Y  Do you have problems with loss of bowel control? Y  Managing your Medications? N  Managing your Finances? N  Housekeeping or managing your Housekeeping? N    Patient Care Team: Wanda Plump, MD as PCP - General  Indicate any recent Medical Services you may have received from other than [facility] providers in the past year (date may be approximate).     Assessment:   This is a routine wellness examination for [place].  Hearing/Vision screen No results found.   Goals Addressed   None    Depression Screen    12/06/2022    3:15 PM 11/08/2022    2:23 PM 07/05/2022    1:44 PM 03/05/2022    1:51 PM 11/02/2021    1:33 PM 08/10/2021   10:30 AM 05/01/2021   11:33 AM  PHQ 2/9 Scores  PHQ - 2 Score 0 0 0 0 0 0 0  PHQ- 9 Score  0 1 1 2 1      Fall Risk    12/06/2022    3:08 PM 11/08/2022    2:02 PM 07/05/2022    1:19 PM 03/05/2022    1:13 PM 11/02/2021    1:12 PM  Fall Risk   Falls in the past year? 1 0 0 0 0  Number falls in past yr: 0 0 0 0 0  Injury with Fall? 0 0 0 0 0  Risk for fall due to : No Fall Risks      Follow up Falls evaluation completed Falls evaluation completed Falls evaluation completed Falls evaluation completed Falls evaluation completed    MEDICARE RISK AT HOME: Medicare Risk at Home Any stairs in or around the home?: Yes (a few steps going up the the home) If so, are there any without handrails?: Yes Home free of loose throw rugs in walkways, pet beds, electrical  cords, etc?: Yes Adequate lighting in your home to reduce risk of falls?: Yes Life alert?: No Use of a cane, walker or w/c?: No Grab bars in the bathroom?: Yes Shower chair or bench in shower?: Yes (built in seat) Elevated toilet seat or a handicapped toilet?: No  TIMED UP AND GO:  Was the test performed?  No    Cognitive Function:        12/06/2022    3:16 PM 05/01/2021   11:36 AM  6CIT Screen  What Year? 0 points 0 points  What month? 0  points 0 points  What time? 0 points 0 points  Count back from 20 0 points 0 points  Months in reverse 0 points 0 points  Repeat phrase 2 points 0 points  Total Score 2 points 0 points    Immunizations Immunization History  Administered Date(s) Administered   Fluad Quad(high Dose 65+) 10/02/2018, 11/03/2019, 10/25/2020, 11/02/2021   Fluad Trivalent(High Dose 65+) 11/08/2022   Influenza Split 12/08/2013   Influenza Whole 11/16/2009   Influenza, High Dose Seasonal PF 10/14/2014, 12/22/2015, 11/25/2016, 01/01/2018   PFIZER Comirnaty(Gray Top)Covid-19 Tri-Sucrose Vaccine 07/25/2020   PFIZER(Purple Top)SARS-COV-2 Vaccination 03/22/2019, 04/20/2019, 11/19/2019   PNEUMOCOCCAL CONJUGATE-20 11/02/2021, 03/05/2022   Pfizer Covid-19 Vaccine Bivalent Booster 52yrs & up 11/17/2020   Pfizer(Comirnaty)Fall Seasonal Vaccine 12 years and older 07/05/2022   Pneumococcal Conjugate-13 08/10/2014   Pneumococcal Polysaccharide-23 08/28/2009   Td 02/07/1999, 08/24/2008   Tdap 10/01/2014, 06/10/2018   Zoster, Live 08/28/2009    TDAP status: Up to date  Flu Vaccine status: Up to date  Pneumococcal vaccine status: Up to date  Covid-19 vaccine status: Information provided on how to obtain vaccines.   Qualifies for Shingles Vaccine? Yes   Zostavax completed Yes   Shingrix Completed?: No.    Education has been provided regarding the importance of this vaccine. Patient has been advised to call insurance company to determine out of pocket expense if they have not yet received this vaccine. Advised may also receive vaccine at local pharmacy or Health Dept. Verbalized acceptance and understanding.  Screening Tests Health Maintenance  Topic Date Due   Zoster Vaccines- Shingrix (1 of 2) 01/09/1960   Medicare Annual Wellness (AWV)  05/02/2022   COVID-19 Vaccine (7 - 2023-24 season) 09/22/2022   DTaP/Tdap/Td (5 - Td or Tdap) 06/09/2028   Pneumonia Vaccine 25+ Years old  Completed   INFLUENZA VACCINE   Completed   DEXA SCAN  Completed   HPV VACCINES  Aged Out   Colonoscopy  Discontinued   Hepatitis C Screening  Discontinued    Health Maintenance  Health Maintenance Due  Topic Date Due   Zoster Vaccines- Shingrix (1 of 2) 01/09/1960   Medicare Annual Wellness (AWV)  05/02/2022   COVID-19 Vaccine (7 - 2023-24 season) 09/22/2022    Colorectal cancer screening: No longer required.   Mammogram status: Ordered 12/06/22. Pt provided with contact info and advised to call to schedule appt.   Bone Density status: Ordered 12/06/22. Pt provided with contact info and advised to call to schedule appt.  Lung Cancer Screening: (Low Dose CT Chest recommended if Age 20-80 years, 20 pack-year currently smoking OR have quit w/in 15years.) does not qualify.   Additional Screening:  Hepatitis C Screening: does not qualify  Vision Screening: Recommended annual ophthalmology exams for early detection of glaucoma and other disorders of the eye. Is the patient up to date with their annual eye exam?  Yes  Who is the provider or what  is the name of the office in which the patient attends annual eye exams? Can't remember name at this time If pt is not established with a provider, would they like to be referred to a provider to establish care? Yes .   Dental Screening: Recommended annual dental exams for proper oral hygiene  Diabetic Foot Exam: N/a  Community Resource Referral / Chronic Care Management: CRR required this visit?  No   CCM required this visit?  No     Plan:     I have personally reviewed and noted the following in the patient's chart:   Medical and social history Use of alcohol, tobacco or illicit drugs  Current medications and supplements including opioid prescriptions. Patient is not currently taking opioid prescriptions. Functional ability and status Nutritional status Physical activity Advanced directives List of other physicians Hospitalizations, surgeries, and ER  visits in previous 12 months Vitals Screenings to include cognitive, depression, and falls Referrals and appointments  In addition, I have reviewed and discussed with patient certain preventive protocols, quality metrics, and best practice recommendations. A written personalized care plan for preventive services as well as general preventive health recommendations were provided to patient.     Donne Anon, CMA   12/06/2022   After Visit Summary: (MyChart) Due to this being a telephonic visit, the after visit summary with patients personalized plan was offered to patient via MyChart   Nurse Notes: None

## 2022-12-06 NOTE — Patient Instructions (Signed)
Monique Ballard , Thank you for taking time to come for your Medicare Wellness Visit. I appreciate your ongoing commitment to your health goals. Please review the following plan we discussed and let me know if I can assist you in the future.     This is a list of the screening recommended for you and due dates:  Health Maintenance  Topic Date Due   Zoster (Shingles) Vaccine (1 of 2) 01/09/1960   COVID-19 Vaccine (7 - 2023-24 season) 09/22/2022   Medicare Annual Wellness Visit  12/06/2023   DTaP/Tdap/Td vaccine (5 - Td or Tdap) 06/09/2028   Pneumonia Vaccine  Completed   Flu Shot  Completed   DEXA scan (bone density measurement)  Completed   HPV Vaccine  Aged Out   Colon Cancer Screening  Discontinued   Hepatitis C Screening  Discontinued    Next appointment: Follow up in one year for your annual wellness visit   Preventive Care 65 Years and Older, Female Preventive care refers to lifestyle choices and visits with your health care provider that can promote health and wellness. What does preventive care include? A yearly physical exam. This is also called an annual well check. Dental exams once or twice a year. Routine eye exams. Ask your health care provider how often you should have your eyes checked. Personal lifestyle choices, including: Daily care of your teeth and gums. Regular physical activity. Eating a healthy diet. Avoiding tobacco and drug use. Limiting alcohol use. Practicing safe sex. Taking low-dose aspirin every day. Taking vitamin and mineral supplements as recommended by your health care provider. What happens during an annual well check? The services and screenings done by your health care provider during your annual well check will depend on your age, overall health, lifestyle risk factors, and family history of disease. Counseling  Your health care provider may ask you questions about your: Alcohol use. Tobacco use. Drug use. Emotional well-being. Home and  relationship well-being. Sexual activity. Eating habits. History of falls. Memory and ability to understand (cognition). Work and work Astronomer. Reproductive health. Screening  You may have the following tests or measurements: Height, weight, and BMI. Blood pressure. Lipid and cholesterol levels. These may be checked every 5 years, or more frequently if you are over 34 years old. Skin check. Lung cancer screening. You may have this screening every year starting at age 49 if you have a 30-pack-year history of smoking and currently smoke or have quit within the past 15 years. Fecal occult blood test (FOBT) of the stool. You may have this test every year starting at age 91. Flexible sigmoidoscopy or colonoscopy. You may have a sigmoidoscopy every 5 years or a colonoscopy every 10 years starting at age 76. Hepatitis C blood test. Hepatitis B blood test. Sexually transmitted disease (STD) testing. Diabetes screening. This is done by checking your blood sugar (glucose) after you have not eaten for a while (fasting). You may have this done every 1-3 years. Bone density scan. This is done to screen for osteoporosis. You may have this done starting at age 4. Mammogram. This may be done every 1-2 years. Talk to your health care provider about how often you should have regular mammograms. Talk with your health care provider about your test results, treatment options, and if necessary, the need for more tests. Vaccines  Your health care provider may recommend certain vaccines, such as: Influenza vaccine. This is recommended every year. Tetanus, diphtheria, and acellular pertussis (Tdap, Td) vaccine. You may need  a Td booster every 10 years. Zoster vaccine. You may need this after age 10. Pneumococcal 13-valent conjugate (PCV13) vaccine. One dose is recommended after age 66. Pneumococcal polysaccharide (PPSV23) vaccine. One dose is recommended after age 51. Talk to your health care provider  about which screenings and vaccines you need and how often you need them. This information is not intended to replace advice given to you by your health care provider. Make sure you discuss any questions you have with your health care provider. Document Released: 02/03/2015 Document Revised: 09/27/2015 Document Reviewed: 11/08/2014 Elsevier Interactive Patient Education  2017 [employer].  Fall Prevention in the Home Falls can cause injuries. They can happen to people of all ages. There are many things you can do to make your home safe and to help prevent falls. What can I do on the outside of my home? Regularly fix the edges of walkways and driveways and fix any cracks. Remove anything that might make you trip as you walk through a door, such as a raised step or threshold. Trim any bushes or trees on the path to your home. Use bright outdoor lighting. Clear any walking paths of anything that might make someone trip, such as rocks or tools. Regularly check to see if handrails are loose or broken. Make sure that both sides of any steps have handrails. Any raised decks and porches should have guardrails on the edges. Have any leaves, snow, or ice cleared regularly. Use sand or salt on walking paths during winter. Clean up any spills in your garage right away. This includes oil or grease spills. What can I do in the bathroom? Use night lights. Install grab bars by the toilet and in the tub and shower. Do not use towel bars as grab bars. Use non-skid mats or decals in the tub or shower. If you need to sit down in the shower, use a plastic, non-slip stool. Keep the floor dry. Clean up any water that spills on the floor as soon as it happens. Remove soap buildup in the tub or shower regularly. Attach bath mats securely with double-sided non-slip rug tape. Do not have throw rugs and other things on the floor that can make you trip. What can I do in the bedroom? Use night lights. Make sure  that you have a light by your bed that is easy to reach. Do not use any sheets or blankets that are too big for your bed. They should not hang down onto the floor. Have a firm chair that has side arms. You can use this for support while you get dressed. Do not have throw rugs and other things on the floor that can make you trip. What can I do in the kitchen? Clean up any spills right away. Avoid walking on wet floors. Keep items that you use a lot in easy-to-reach places. If you need to reach something above you, use a strong step stool that has a grab bar. Keep electrical cords out of the way. Do not use floor polish or wax that makes floors slippery. If you must use wax, use non-skid floor wax. Do not have throw rugs and other things on the floor that can make you trip. What can I do with my stairs? Do not leave any items on the stairs. Make sure that there are handrails on both sides of the stairs and use them. Fix handrails that are broken or loose. Make sure that handrails are as long as the stairways. Check  any carpeting to make sure that it is firmly attached to the stairs. Fix any carpet that is loose or worn. Avoid having throw rugs at the top or bottom of the stairs. If you do have throw rugs, attach them to the floor with carpet tape. Make sure that you have a light switch at the top of the stairs and the bottom of the stairs. If you do not have them, ask someone to add them for you. What else can I do to help prevent falls? Wear shoes that: Do not have high heels. Have rubber bottoms. Are comfortable and fit you well. Are closed at the toe. Do not wear sandals. If you use a stepladder: Make sure that it is fully opened. Do not climb a closed stepladder. Make sure that both sides of the stepladder are locked into place. Ask someone to hold it for you, if possible. Clearly mark and make sure that you can see: Any grab bars or handrails. First and last steps. Where the edge of  each step is. Use tools that help you move around (mobility aids) if they are needed. These include: Canes. Walkers. Scooters. Crutches. Turn on the lights when you go into a dark area. Replace any light bulbs as soon as they burn out. Set up your furniture so you have a clear path. Avoid moving your furniture around. If any of your floors are uneven, fix them. If there are any pets around you, be aware of where they are. Review your medicines with your doctor. Some medicines can make you feel dizzy. This can increase your chance of falling. Ask your doctor what other things that you can do to help prevent falls. This information is not intended to replace advice given to you by your health care provider. Make sure you discuss any questions you have with your health care provider. Document Released: 11/03/2008 Document Revised: 06/15/2015 Document Reviewed: 02/11/2014 Elsevier Interactive Patient Education  2017 [employer].

## 2022-12-17 NOTE — Progress Notes (Signed)
Biotronik Loop Recorder 

## 2022-12-18 ENCOUNTER — Telehealth (HOSPITAL_BASED_OUTPATIENT_CLINIC_OR_DEPARTMENT_OTHER): Payer: Self-pay

## 2023-01-07 ENCOUNTER — Encounter: Payer: Self-pay | Admitting: Internal Medicine

## 2023-01-07 DIAGNOSIS — F331 Major depressive disorder, recurrent, moderate: Secondary | ICD-10-CM

## 2023-01-07 DIAGNOSIS — F4001 Agoraphobia with panic disorder: Secondary | ICD-10-CM

## 2023-01-07 DIAGNOSIS — F411 Generalized anxiety disorder: Secondary | ICD-10-CM

## 2023-01-07 MED ORDER — METOPROLOL TARTRATE 25 MG PO TABS: 12.5000 mg | ORAL_TABLET | Freq: Two times a day (BID) | ORAL | 1 refills | Status: DC

## 2023-01-07 MED ORDER — OLANZAPINE 7.5 MG PO TABS: 7.5000 mg | ORAL_TABLET | Freq: Every evening | ORAL | 1 refills | Status: DC | PRN

## 2023-01-07 MED ORDER — ALENDRONATE SODIUM 70 MG PO TABS: 70.0000 mg | ORAL_TABLET | ORAL | 3 refills | Status: DC

## 2023-01-07 MED ORDER — PANTOPRAZOLE SODIUM 40 MG PO TBEC: 40.0000 mg | DELAYED_RELEASE_TABLET | Freq: Every day | ORAL | 1 refills | Status: DC

## 2023-01-07 MED ORDER — ROSUVASTATIN CALCIUM 20 MG PO TABS: 20.0000 mg | ORAL_TABLET | Freq: Every day | ORAL | 1 refills | Status: DC

## 2023-01-07 MED ORDER — FOLIC ACID 1 MG PO TABS: 1.0000 mg | ORAL_TABLET | Freq: Every day | ORAL | 3 refills | Status: AC

## 2023-01-07 MED ORDER — ESCITALOPRAM OXALATE 20 MG PO TABS: 20.0000 mg | ORAL_TABLET | Freq: Every day | ORAL | 1 refills | Status: DC

## 2023-01-13 ENCOUNTER — Telehealth: Payer: Self-pay | Admitting: Cardiology

## 2023-01-13 ENCOUNTER — Other Ambulatory Visit: Payer: Self-pay

## 2023-01-13 MED ORDER — ROSUVASTATIN CALCIUM 20 MG PO TABS: 20.0000 mg | ORAL_TABLET | Freq: Every day | ORAL | 1 refills | Status: AC

## 2023-01-13 MED ORDER — ESCITALOPRAM OXALATE 20 MG PO TABS: 20.0000 mg | ORAL_TABLET | Freq: Every day | ORAL | 1 refills | Status: AC

## 2023-01-13 MED ORDER — APIXABAN 5 MG PO TABS: 5.0000 mg | ORAL_TABLET | Freq: Two times a day (BID) | ORAL | 0 refills | Status: AC

## 2023-01-13 MED ORDER — PANTOPRAZOLE SODIUM 40 MG PO TBEC: 40.0000 mg | DELAYED_RELEASE_TABLET | Freq: Every day | ORAL | 1 refills | Status: AC

## 2023-01-13 MED ORDER — METOPROLOL TARTRATE 25 MG PO TABS: 12.5000 mg | ORAL_TABLET | Freq: Two times a day (BID) | ORAL | 1 refills | Status: DC

## 2023-01-13 MED ORDER — ALENDRONATE SODIUM 70 MG PO TABS: 70.0000 mg | ORAL_TABLET | ORAL | 3 refills | Status: AC

## 2023-01-13 MED ORDER — OLANZAPINE 7.5 MG PO TABS: 7.5000 mg | ORAL_TABLET | Freq: Every evening | ORAL | 1 refills | Status: AC | PRN

## 2023-01-13 NOTE — Telephone Encounter (Signed)
*  STAT* If patient is at the pharmacy, call can be transferred to refill team.   1. Which medications need to be refilled? (please list name of each medication and dose if known) apixaban (ELIQUIS) 5 MG TABS tablet    2. Would you like to learn more about the convenience, safety, & potential cost savings by using the [facility]?     3. Are you open to using the [facility] (Type [facility]. ).   4. Which pharmacy/location (including street and city if local pharmacy) is medication to be sent to? [facility], [place] - [address]    5. Do they need a 30 day or 90 day supply? 90

## 2023-01-13 NOTE — Telephone Encounter (Signed)
Prescription refill request for Eliquis received. Indication:stroke Last office visit:needs cardiology appt Scr:0.77  10/24 Age: 82 Weight:59.1  kg  Prescription refilled

## 2023-01-21 ENCOUNTER — Telehealth: Payer: Self-pay | Admitting: Internal Medicine

## 2023-01-21 NOTE — Telephone Encounter (Signed)
Pt's Daughter dropped off documents to be filled out by provider [facility] Form 2 pages) Pt's daughter stated ok to fax document when ready to Fax # [phone]. Document put at front office tray under providers name.

## 2023-01-21 NOTE — Telephone Encounter (Signed)
Received, will complete on Thursday when we return to office.

## 2023-01-23 NOTE — Telephone Encounter (Signed)
Form completed and placed in PCP red folder to be signed.  

## 2023-01-24 DIAGNOSIS — Z0279 Encounter for issue of other medical certificate: Secondary | ICD-10-CM

## 2023-01-24 NOTE — Telephone Encounter (Signed)
 Forms faxed to ATTN: Wonda Horner at [phone]. Form sent for scanning.

## 2023-01-24 NOTE — Telephone Encounter (Signed)
 Received fax confirmation

## 2023-01-30 ENCOUNTER — Ambulatory Visit (INDEPENDENT_AMBULATORY_CARE_PROVIDER_SITE_OTHER): Payer: Medicare Other

## 2023-01-30 DIAGNOSIS — I639 Cerebral infarction, unspecified: Secondary | ICD-10-CM | POA: Diagnosis not present

## 2023-01-30 LAB — CUP PACEART REMOTE DEVICE CHECK
Date Time Interrogation Session: 20250109074957
Implantable Pulse Generator Implant Date: 20230913
Pulse Gen Model: 450218
Pulse Gen Serial Number: 95049141

## 2023-02-04 MED ORDER — METOPROLOL TARTRATE 25 MG PO TABS: 12.5000 mg | ORAL_TABLET | Freq: Two times a day (BID) | ORAL | 1 refills | Status: AC

## 2023-02-17 ENCOUNTER — Encounter: Payer: Self-pay | Admitting: Internal Medicine

## 2023-02-19 ENCOUNTER — Other Ambulatory Visit: Payer: Self-pay

## 2023-02-19 ENCOUNTER — Other Ambulatory Visit: Payer: Self-pay | Admitting: Cardiology

## 2023-02-19 DIAGNOSIS — Z111 Encounter for screening for respiratory tuberculosis: Secondary | ICD-10-CM

## 2023-02-19 NOTE — Telephone Encounter (Signed)
Prescription refill request for Eliquis received. Indication: Stroke Last office visit: 10/10/21 Monique Ballard)  Scr: 0.77 (11/08/22)  Age: 83 Weight: 59.1kg  Office visit overdue.  Called pt, no answer. Left message on voicemail.

## 2023-02-20 ENCOUNTER — Other Ambulatory Visit (INDEPENDENT_AMBULATORY_CARE_PROVIDER_SITE_OTHER): Payer: Medicare Other

## 2023-02-20 DIAGNOSIS — Z111 Encounter for screening for respiratory tuberculosis: Secondary | ICD-10-CM | POA: Diagnosis not present

## 2023-02-23 LAB — QUANTIFERON-TB GOLD PLUS
Mitogen-NIL: 5.03 [IU]/mL
NIL: 0.03 [IU]/mL
QuantiFERON-TB Gold Plus: NEGATIVE
TB1-NIL: 0.02 [IU]/mL
TB2-NIL: 0 [IU]/mL

## 2023-02-24 NOTE — Telephone Encounter (Addendum)
Called patient x 2; since she is overdue to see Cardiologist. There was no answer so had to leave a message.   Message sent to Schedulers.

## 2023-02-26 DIAGNOSIS — I10 Essential (primary) hypertension: Secondary | ICD-10-CM | POA: Diagnosis not present

## 2023-02-26 DIAGNOSIS — F411 Generalized anxiety disorder: Secondary | ICD-10-CM | POA: Diagnosis not present

## 2023-02-26 DIAGNOSIS — Z79899 Other long term (current) drug therapy: Secondary | ICD-10-CM | POA: Diagnosis not present

## 2023-02-26 DIAGNOSIS — F324 Major depressive disorder, single episode, in partial remission: Secondary | ICD-10-CM | POA: Diagnosis not present

## 2023-02-26 DIAGNOSIS — E7800 Pure hypercholesterolemia, unspecified: Secondary | ICD-10-CM | POA: Diagnosis not present

## 2023-02-26 DIAGNOSIS — M6281 Muscle weakness (generalized): Secondary | ICD-10-CM | POA: Diagnosis not present

## 2023-02-26 DIAGNOSIS — M810 Age-related osteoporosis without current pathological fracture: Secondary | ICD-10-CM | POA: Diagnosis not present

## 2023-03-03 ENCOUNTER — Ambulatory Visit: Payer: Medicare Other

## 2023-03-03 DIAGNOSIS — I639 Cerebral infarction, unspecified: Secondary | ICD-10-CM

## 2023-03-04 LAB — CUP PACEART REMOTE DEVICE CHECK
Date Time Interrogation Session: 20250211084247
Implantable Pulse Generator Implant Date: 20230913
Pulse Gen Model: 450218
Pulse Gen Serial Number: 95049141

## 2023-03-05 ENCOUNTER — Encounter: Payer: Self-pay | Admitting: Cardiology

## 2023-03-05 ENCOUNTER — Telehealth: Payer: Self-pay | Admitting: Cardiology

## 2023-03-05 NOTE — Telephone Encounter (Signed)
-----   Message from Nurse Candance H sent at 02/24/2023  9:07 AM EST ----- Regarding: Needs Appt Good morning,   This pt is overdue for cardiologist appointment. Can you reach out to her for an appointment, please?   Thanks in advance,  [address]

## 2023-03-05 NOTE — Telephone Encounter (Signed)
LVM 3x to schedule overdue f/u, will send recall letter to pt

## 2023-03-06 DIAGNOSIS — E559 Vitamin D deficiency, unspecified: Secondary | ICD-10-CM | POA: Diagnosis not present

## 2023-03-06 DIAGNOSIS — Z79899 Other long term (current) drug therapy: Secondary | ICD-10-CM | POA: Diagnosis not present

## 2023-03-06 DIAGNOSIS — I10 Essential (primary) hypertension: Secondary | ICD-10-CM | POA: Diagnosis not present

## 2023-03-06 DIAGNOSIS — I129 Hypertensive chronic kidney disease with stage 1 through stage 4 chronic kidney disease, or unspecified chronic kidney disease: Secondary | ICD-10-CM | POA: Diagnosis not present

## 2023-03-11 ENCOUNTER — Ambulatory Visit: Payer: Medicare Other | Admitting: Internal Medicine

## 2023-03-11 NOTE — Progress Notes (Signed)
 [facility] Summary Report / Loop Recorder

## 2023-03-17 DIAGNOSIS — M629 Disorder of muscle, unspecified: Secondary | ICD-10-CM | POA: Diagnosis not present

## 2023-03-17 DIAGNOSIS — R278 Other lack of coordination: Secondary | ICD-10-CM | POA: Diagnosis not present

## 2023-03-17 DIAGNOSIS — M810 Age-related osteoporosis without current pathological fracture: Secondary | ICD-10-CM | POA: Diagnosis not present

## 2023-03-17 DIAGNOSIS — M6281 Muscle weakness (generalized): Secondary | ICD-10-CM | POA: Diagnosis not present

## 2023-03-17 DIAGNOSIS — R54 Age-related physical debility: Secondary | ICD-10-CM | POA: Diagnosis not present

## 2023-03-21 DIAGNOSIS — I10 Essential (primary) hypertension: Secondary | ICD-10-CM | POA: Diagnosis not present

## 2023-03-21 DIAGNOSIS — F324 Major depressive disorder, single episode, in partial remission: Secondary | ICD-10-CM | POA: Diagnosis not present

## 2023-04-03 ENCOUNTER — Ambulatory Visit: Payer: Medicare Other

## 2023-04-03 DIAGNOSIS — I639 Cerebral infarction, unspecified: Secondary | ICD-10-CM | POA: Diagnosis not present

## 2023-04-03 LAB — CUP PACEART REMOTE DEVICE CHECK
Date Time Interrogation Session: 20250313081311
Implantable Pulse Generator Implant Date: 20230913
Pulse Gen Model: 450218
Pulse Gen Serial Number: 95049141

## 2023-04-10 NOTE — Addendum Note (Signed)
 Addended by: Geralyn Flash D on: 04/10/2023 02:17 PM   Modules accepted: Orders

## 2023-04-10 NOTE — Progress Notes (Signed)
 [employer]

## 2023-04-18 DIAGNOSIS — I10 Essential (primary) hypertension: Secondary | ICD-10-CM | POA: Diagnosis not present

## 2023-04-18 DIAGNOSIS — F324 Major depressive disorder, single episode, in partial remission: Secondary | ICD-10-CM | POA: Diagnosis not present

## 2023-04-22 DIAGNOSIS — B351 Tinea unguium: Secondary | ICD-10-CM | POA: Diagnosis not present

## 2023-04-22 DIAGNOSIS — I739 Peripheral vascular disease, unspecified: Secondary | ICD-10-CM | POA: Diagnosis not present

## 2023-04-28 DIAGNOSIS — E782 Mixed hyperlipidemia: Secondary | ICD-10-CM | POA: Diagnosis not present

## 2023-04-28 DIAGNOSIS — I1310 Hypertensive heart and chronic kidney disease without heart failure, with stage 1 through stage 4 chronic kidney disease, or unspecified chronic kidney disease: Secondary | ICD-10-CM | POA: Diagnosis not present

## 2023-04-28 DIAGNOSIS — K219 Gastro-esophageal reflux disease without esophagitis: Secondary | ICD-10-CM | POA: Diagnosis not present

## 2023-04-28 DIAGNOSIS — I2510 Atherosclerotic heart disease of native coronary artery without angina pectoris: Secondary | ICD-10-CM | POA: Diagnosis not present

## 2023-05-05 ENCOUNTER — Ambulatory Visit (INDEPENDENT_AMBULATORY_CARE_PROVIDER_SITE_OTHER): Payer: Medicare Other

## 2023-05-05 DIAGNOSIS — I639 Cerebral infarction, unspecified: Secondary | ICD-10-CM

## 2023-05-05 LAB — CUP PACEART REMOTE DEVICE CHECK
Date Time Interrogation Session: 20250414112744
Implantable Pulse Generator Implant Date: 20230913
Pulse Gen Model: 450218
Pulse Gen Serial Number: 95049141

## 2023-05-13 DIAGNOSIS — I10 Essential (primary) hypertension: Secondary | ICD-10-CM | POA: Diagnosis not present

## 2023-05-13 DIAGNOSIS — F324 Major depressive disorder, single episode, in partial remission: Secondary | ICD-10-CM | POA: Diagnosis not present

## 2023-05-16 NOTE — Progress Notes (Signed)
 [facility] Summary Report / Loop Recorder

## 2023-06-05 ENCOUNTER — Ambulatory Visit (INDEPENDENT_AMBULATORY_CARE_PROVIDER_SITE_OTHER): Payer: Medicare Other

## 2023-06-05 DIAGNOSIS — I639 Cerebral infarction, unspecified: Secondary | ICD-10-CM | POA: Diagnosis not present

## 2023-06-05 LAB — CUP PACEART REMOTE DEVICE CHECK
Date Time Interrogation Session: 20250515080804
Implantable Pulse Generator Implant Date: 20230913
Pulse Gen Model: 450218
Pulse Gen Serial Number: 95049141

## 2023-06-11 ENCOUNTER — Ambulatory Visit: Payer: Self-pay | Admitting: Cardiology

## 2023-06-18 DIAGNOSIS — B000 Eczema herpeticum: Secondary | ICD-10-CM | POA: Diagnosis not present

## 2023-06-18 DIAGNOSIS — I6930 Unspecified sequelae of cerebral infarction: Secondary | ICD-10-CM | POA: Diagnosis not present

## 2023-06-18 DIAGNOSIS — I739 Peripheral vascular disease, unspecified: Secondary | ICD-10-CM | POA: Diagnosis not present

## 2023-06-18 DIAGNOSIS — R0989 Other specified symptoms and signs involving the circulatory and respiratory systems: Secondary | ICD-10-CM | POA: Diagnosis not present

## 2023-06-20 DIAGNOSIS — F324 Major depressive disorder, single episode, in partial remission: Secondary | ICD-10-CM | POA: Diagnosis not present

## 2023-06-20 DIAGNOSIS — I10 Essential (primary) hypertension: Secondary | ICD-10-CM | POA: Diagnosis not present

## 2023-06-23 DIAGNOSIS — R54 Age-related physical debility: Secondary | ICD-10-CM | POA: Diagnosis not present

## 2023-06-23 DIAGNOSIS — Z7982 Long term (current) use of aspirin: Secondary | ICD-10-CM | POA: Diagnosis not present

## 2023-06-23 DIAGNOSIS — E782 Mixed hyperlipidemia: Secondary | ICD-10-CM | POA: Diagnosis not present

## 2023-06-23 DIAGNOSIS — K219 Gastro-esophageal reflux disease without esophagitis: Secondary | ICD-10-CM | POA: Diagnosis not present

## 2023-06-23 DIAGNOSIS — Z7901 Long term (current) use of anticoagulants: Secondary | ICD-10-CM | POA: Diagnosis not present

## 2023-06-24 NOTE — Progress Notes (Signed)
 [employer]

## 2023-06-25 DIAGNOSIS — F1091 Alcohol use, unspecified, in remission: Secondary | ICD-10-CM | POA: Diagnosis not present

## 2023-06-25 DIAGNOSIS — N182 Chronic kidney disease, stage 2 (mild): Secondary | ICD-10-CM | POA: Diagnosis not present

## 2023-06-25 DIAGNOSIS — R059 Cough, unspecified: Secondary | ICD-10-CM | POA: Diagnosis not present

## 2023-06-25 DIAGNOSIS — E785 Hyperlipidemia, unspecified: Secondary | ICD-10-CM | POA: Diagnosis not present

## 2023-06-25 DIAGNOSIS — M810 Age-related osteoporosis without current pathological fracture: Secondary | ICD-10-CM | POA: Diagnosis not present

## 2023-06-25 DIAGNOSIS — R0981 Nasal congestion: Secondary | ICD-10-CM | POA: Diagnosis not present

## 2023-06-27 DIAGNOSIS — Z79899 Other long term (current) drug therapy: Secondary | ICD-10-CM | POA: Diagnosis not present

## 2023-07-07 ENCOUNTER — Ambulatory Visit (INDEPENDENT_AMBULATORY_CARE_PROVIDER_SITE_OTHER)

## 2023-07-07 DIAGNOSIS — I639 Cerebral infarction, unspecified: Secondary | ICD-10-CM

## 2023-07-07 LAB — CUP PACEART REMOTE DEVICE CHECK
Date Time Interrogation Session: 20250616113233
Implantable Pulse Generator Implant Date: 20230913
Pulse Gen Model: 450218
Pulse Gen Serial Number: 95049141

## 2023-07-10 ENCOUNTER — Ambulatory Visit: Payer: Self-pay | Admitting: Cardiology

## 2023-07-15 NOTE — Progress Notes (Signed)
 [facility] Summary Report / Loop Recorder

## 2023-07-21 DIAGNOSIS — I10 Essential (primary) hypertension: Secondary | ICD-10-CM | POA: Diagnosis not present

## 2023-07-21 DIAGNOSIS — F411 Generalized anxiety disorder: Secondary | ICD-10-CM | POA: Diagnosis not present

## 2023-07-21 DIAGNOSIS — F320 Major depressive disorder, single episode, mild: Secondary | ICD-10-CM | POA: Diagnosis not present

## 2023-07-21 DIAGNOSIS — F419 Anxiety disorder, unspecified: Secondary | ICD-10-CM | POA: Diagnosis not present

## 2023-07-21 DIAGNOSIS — K2100 Gastro-esophageal reflux disease with esophagitis, without bleeding: Secondary | ICD-10-CM | POA: Diagnosis not present

## 2023-07-21 DIAGNOSIS — M8000XA Age-related osteoporosis with current pathological fracture, unspecified site, initial encounter for fracture: Secondary | ICD-10-CM | POA: Diagnosis not present

## 2023-07-23 DIAGNOSIS — E559 Vitamin D deficiency, unspecified: Secondary | ICD-10-CM | POA: Diagnosis not present

## 2023-07-23 DIAGNOSIS — Z79899 Other long term (current) drug therapy: Secondary | ICD-10-CM | POA: Diagnosis not present

## 2023-07-23 DIAGNOSIS — I129 Hypertensive chronic kidney disease with stage 1 through stage 4 chronic kidney disease, or unspecified chronic kidney disease: Secondary | ICD-10-CM | POA: Diagnosis not present

## 2023-07-23 DIAGNOSIS — I10 Essential (primary) hypertension: Secondary | ICD-10-CM | POA: Diagnosis not present

## 2023-07-24 DIAGNOSIS — Z79899 Other long term (current) drug therapy: Secondary | ICD-10-CM | POA: Diagnosis not present

## 2023-07-24 DIAGNOSIS — I10 Essential (primary) hypertension: Secondary | ICD-10-CM | POA: Diagnosis not present

## 2023-07-24 DIAGNOSIS — I129 Hypertensive chronic kidney disease with stage 1 through stage 4 chronic kidney disease, or unspecified chronic kidney disease: Secondary | ICD-10-CM | POA: Diagnosis not present

## 2023-07-24 DIAGNOSIS — E559 Vitamin D deficiency, unspecified: Secondary | ICD-10-CM | POA: Diagnosis not present

## 2023-08-07 ENCOUNTER — Ambulatory Visit (INDEPENDENT_AMBULATORY_CARE_PROVIDER_SITE_OTHER)

## 2023-08-07 DIAGNOSIS — I639 Cerebral infarction, unspecified: Secondary | ICD-10-CM

## 2023-08-07 LAB — CUP PACEART REMOTE DEVICE CHECK
Date Time Interrogation Session: 20250717112403
Implantable Pulse Generator Implant Date: 20230913
Pulse Gen Model: 450218
Pulse Gen Serial Number: 95049141

## 2023-08-10 ENCOUNTER — Ambulatory Visit: Payer: Self-pay | Admitting: Cardiology

## 2023-08-14 DIAGNOSIS — K2100 Gastro-esophageal reflux disease with esophagitis, without bleeding: Secondary | ICD-10-CM | POA: Diagnosis not present

## 2023-08-14 DIAGNOSIS — M8000XA Age-related osteoporosis with current pathological fracture, unspecified site, initial encounter for fracture: Secondary | ICD-10-CM | POA: Diagnosis not present

## 2023-08-14 DIAGNOSIS — F320 Major depressive disorder, single episode, mild: Secondary | ICD-10-CM | POA: Diagnosis not present

## 2023-08-14 DIAGNOSIS — F419 Anxiety disorder, unspecified: Secondary | ICD-10-CM | POA: Diagnosis not present

## 2023-08-14 NOTE — Progress Notes (Signed)
 [employer] loop Recorder

## 2023-09-08 ENCOUNTER — Ambulatory Visit (INDEPENDENT_AMBULATORY_CARE_PROVIDER_SITE_OTHER)

## 2023-09-08 ENCOUNTER — Encounter

## 2023-09-08 DIAGNOSIS — I639 Cerebral infarction, unspecified: Secondary | ICD-10-CM | POA: Diagnosis not present

## 2023-09-10 LAB — CUP PACEART REMOTE DEVICE CHECK
Date Time Interrogation Session: 20250818115203
Implantable Pulse Generator Implant Date: 20230913
Pulse Gen Model: 450218
Pulse Gen Serial Number: 95049141

## 2023-09-11 DIAGNOSIS — N182 Chronic kidney disease, stage 2 (mild): Secondary | ICD-10-CM | POA: Diagnosis not present

## 2023-09-11 DIAGNOSIS — E785 Hyperlipidemia, unspecified: Secondary | ICD-10-CM | POA: Diagnosis not present

## 2023-09-11 DIAGNOSIS — I129 Hypertensive chronic kidney disease with stage 1 through stage 4 chronic kidney disease, or unspecified chronic kidney disease: Secondary | ICD-10-CM | POA: Diagnosis not present

## 2023-09-11 DIAGNOSIS — F32A Depression, unspecified: Secondary | ICD-10-CM | POA: Diagnosis not present

## 2023-09-11 DIAGNOSIS — K219 Gastro-esophageal reflux disease without esophagitis: Secondary | ICD-10-CM | POA: Diagnosis not present

## 2023-09-11 DIAGNOSIS — M810 Age-related osteoporosis without current pathological fracture: Secondary | ICD-10-CM | POA: Diagnosis not present

## 2023-09-11 DIAGNOSIS — Z7901 Long term (current) use of anticoagulants: Secondary | ICD-10-CM | POA: Diagnosis not present

## 2023-09-17 DIAGNOSIS — W19XXXA Unspecified fall, initial encounter: Secondary | ICD-10-CM | POA: Diagnosis not present

## 2023-09-17 DIAGNOSIS — I739 Peripheral vascular disease, unspecified: Secondary | ICD-10-CM | POA: Diagnosis not present

## 2023-09-17 DIAGNOSIS — M810 Age-related osteoporosis without current pathological fracture: Secondary | ICD-10-CM | POA: Diagnosis not present

## 2023-09-18 DIAGNOSIS — I739 Peripheral vascular disease, unspecified: Secondary | ICD-10-CM | POA: Diagnosis not present

## 2023-09-18 DIAGNOSIS — B351 Tinea unguium: Secondary | ICD-10-CM | POA: Diagnosis not present

## 2023-09-19 DIAGNOSIS — I10 Essential (primary) hypertension: Secondary | ICD-10-CM | POA: Diagnosis not present

## 2023-09-19 DIAGNOSIS — F411 Generalized anxiety disorder: Secondary | ICD-10-CM | POA: Diagnosis not present

## 2023-09-21 ENCOUNTER — Ambulatory Visit: Payer: Self-pay | Admitting: Cardiology

## 2023-10-09 ENCOUNTER — Ambulatory Visit (INDEPENDENT_AMBULATORY_CARE_PROVIDER_SITE_OTHER)

## 2023-10-09 ENCOUNTER — Encounter

## 2023-10-09 DIAGNOSIS — I639 Cerebral infarction, unspecified: Secondary | ICD-10-CM | POA: Diagnosis not present

## 2023-10-09 LAB — CUP PACEART REMOTE DEVICE CHECK
Date Time Interrogation Session: 20250918090020
Implantable Pulse Generator Implant Date: 20230913
Pulse Gen Model: 450218
Pulse Gen Serial Number: 95049141

## 2023-10-12 ENCOUNTER — Ambulatory Visit: Payer: Self-pay | Admitting: Cardiology

## 2023-10-14 NOTE — Progress Notes (Signed)
 Remote Loop Recorder Transmission

## 2023-10-15 DIAGNOSIS — K219 Gastro-esophageal reflux disease without esophagitis: Secondary | ICD-10-CM | POA: Diagnosis not present

## 2023-10-15 DIAGNOSIS — D649 Anemia, unspecified: Secondary | ICD-10-CM | POA: Diagnosis not present

## 2023-10-15 DIAGNOSIS — E7800 Pure hypercholesterolemia, unspecified: Secondary | ICD-10-CM | POA: Diagnosis not present

## 2023-10-15 NOTE — Progress Notes (Signed)
 Remote Loop Recorder Transmission

## 2023-10-17 DIAGNOSIS — F411 Generalized anxiety disorder: Secondary | ICD-10-CM | POA: Diagnosis not present

## 2023-10-17 DIAGNOSIS — I10 Essential (primary) hypertension: Secondary | ICD-10-CM | POA: Diagnosis not present

## 2023-10-17 DIAGNOSIS — Z23 Encounter for immunization: Secondary | ICD-10-CM | POA: Diagnosis not present

## 2023-10-28 NOTE — Progress Notes (Signed)
 Remote Loop Recorder Transmission

## 2023-11-10 ENCOUNTER — Encounter

## 2023-11-12 DIAGNOSIS — K219 Gastro-esophageal reflux disease without esophagitis: Secondary | ICD-10-CM | POA: Diagnosis not present

## 2023-11-12 DIAGNOSIS — M810 Age-related osteoporosis without current pathological fracture: Secondary | ICD-10-CM | POA: Diagnosis not present

## 2023-11-12 DIAGNOSIS — D649 Anemia, unspecified: Secondary | ICD-10-CM | POA: Diagnosis not present

## 2023-11-12 DIAGNOSIS — E785 Hyperlipidemia, unspecified: Secondary | ICD-10-CM | POA: Diagnosis not present

## 2023-11-13 ENCOUNTER — Telehealth: Payer: Self-pay

## 2023-11-13 NOTE — Telephone Encounter (Signed)
 [employer] alert:  No messages received for at least 21 days Last message received 22 days ago. The patient will be deactivated in 68 days.

## 2023-11-14 NOTE — Telephone Encounter (Signed)
 Pt daughter called back letting us  know she is going to see her mom this weekend at the facility and she will check on the monitor then and make sure everything is working okay

## 2023-11-14 NOTE — Telephone Encounter (Signed)
 LM on cell phone (appears for daughter Randine).  Requested they check patient's remote monitor and ensure it is plugged in and reading ok and to call our device clinic to confirm or troubleshoot further.

## 2023-11-19 DIAGNOSIS — I10 Essential (primary) hypertension: Secondary | ICD-10-CM | POA: Diagnosis not present

## 2023-11-19 DIAGNOSIS — F411 Generalized anxiety disorder: Secondary | ICD-10-CM | POA: Diagnosis not present

## 2023-11-20 DIAGNOSIS — B351 Tinea unguium: Secondary | ICD-10-CM | POA: Diagnosis not present

## 2023-11-20 DIAGNOSIS — I739 Peripheral vascular disease, unspecified: Secondary | ICD-10-CM | POA: Diagnosis not present

## 2023-12-10 DIAGNOSIS — F320 Major depressive disorder, single episode, mild: Secondary | ICD-10-CM | POA: Diagnosis not present

## 2023-12-10 DIAGNOSIS — D649 Anemia, unspecified: Secondary | ICD-10-CM | POA: Diagnosis not present

## 2023-12-10 DIAGNOSIS — Z7901 Long term (current) use of anticoagulants: Secondary | ICD-10-CM | POA: Diagnosis not present

## 2023-12-10 DIAGNOSIS — G3184 Mild cognitive impairment, so stated: Secondary | ICD-10-CM | POA: Diagnosis not present

## 2023-12-11 ENCOUNTER — Encounter

## 2023-12-11 ENCOUNTER — Ambulatory Visit

## 2023-12-11 DIAGNOSIS — E559 Vitamin D deficiency, unspecified: Secondary | ICD-10-CM | POA: Diagnosis not present

## 2023-12-11 DIAGNOSIS — Z79899 Other long term (current) drug therapy: Secondary | ICD-10-CM | POA: Diagnosis not present

## 2023-12-11 DIAGNOSIS — I129 Hypertensive chronic kidney disease with stage 1 through stage 4 chronic kidney disease, or unspecified chronic kidney disease: Secondary | ICD-10-CM | POA: Diagnosis not present

## 2023-12-11 DIAGNOSIS — I639 Cerebral infarction, unspecified: Secondary | ICD-10-CM | POA: Diagnosis not present

## 2023-12-11 DIAGNOSIS — I10 Essential (primary) hypertension: Secondary | ICD-10-CM | POA: Diagnosis not present

## 2023-12-11 LAB — CUP PACEART REMOTE DEVICE CHECK
Date Time Interrogation Session: 20251120081830
Implantable Pulse Generator Implant Date: 20230913
Pulse Gen Model: 450218
Pulse Gen Serial Number: 95049141

## 2023-12-14 ENCOUNTER — Ambulatory Visit: Payer: Self-pay | Admitting: Cardiology

## 2023-12-15 NOTE — Progress Notes (Signed)
 Remote Loop Recorder Transmission

## 2024-01-11 ENCOUNTER — Ambulatory Visit

## 2024-01-11 DIAGNOSIS — I639 Cerebral infarction, unspecified: Secondary | ICD-10-CM | POA: Diagnosis not present

## 2024-01-12 ENCOUNTER — Encounter

## 2024-01-13 LAB — CUP PACEART REMOTE DEVICE CHECK
Date Time Interrogation Session: 20251222091730
Implantable Pulse Generator Implant Date: 20230913
Pulse Gen Model: 450218
Pulse Gen Serial Number: 95049141

## 2024-01-14 NOTE — Progress Notes (Signed)
 Remote Loop Recorder Transmission

## 2024-01-18 ENCOUNTER — Ambulatory Visit: Payer: Self-pay | Admitting: Cardiology

## 2024-02-11 ENCOUNTER — Ambulatory Visit

## 2024-02-11 DIAGNOSIS — I639 Cerebral infarction, unspecified: Secondary | ICD-10-CM

## 2024-02-12 ENCOUNTER — Encounter

## 2024-02-12 LAB — CUP PACEART REMOTE DEVICE CHECK
Date Time Interrogation Session: 20260121073405
Implantable Pulse Generator Implant Date: 20230913
Pulse Gen Model: 450218
Pulse Gen Serial Number: 95049141

## 2024-02-14 NOTE — Progress Notes (Signed)
 Remote Loop Recorder Transmission

## 2024-02-16 ENCOUNTER — Ambulatory Visit: Payer: Self-pay | Admitting: Cardiology

## 2024-03-11 ENCOUNTER — Ambulatory Visit

## 2024-03-13 ENCOUNTER — Ambulatory Visit

## 2024-03-15 ENCOUNTER — Encounter

## 2024-04-13 ENCOUNTER — Ambulatory Visit

## 2024-05-14 ENCOUNTER — Ambulatory Visit

## 2024-06-10 ENCOUNTER — Ambulatory Visit

## 2024-06-15 ENCOUNTER — Ambulatory Visit

## 2024-07-15 ENCOUNTER — Ambulatory Visit

## 2024-08-15 ENCOUNTER — Ambulatory Visit

## 2024-09-09 ENCOUNTER — Ambulatory Visit

## 2024-09-15 ENCOUNTER — Ambulatory Visit

## 2024-10-16 ENCOUNTER — Ambulatory Visit

## 2024-12-09 ENCOUNTER — Ambulatory Visit
